# Patient Record
Sex: Male | Born: 1945
Health system: Southern US, Community
[De-identification: ages and names within clinical notes are randomized; demographics above are authoritative.]

## PROBLEM LIST (undated history)

## (undated) DIAGNOSIS — T7840XA Allergy, unspecified, initial encounter: Secondary | ICD-10-CM

## (undated) DIAGNOSIS — I350 Nonrheumatic aortic (valve) stenosis: Secondary | ICD-10-CM

## (undated) DIAGNOSIS — H409 Unspecified glaucoma: Secondary | ICD-10-CM

## (undated) DIAGNOSIS — G4733 Obstructive sleep apnea (adult) (pediatric): Secondary | ICD-10-CM

## (undated) DIAGNOSIS — Z86010 Personal history of colonic polyps: Secondary | ICD-10-CM

## (undated) DIAGNOSIS — R011 Cardiac murmur, unspecified: Secondary | ICD-10-CM

## (undated) DIAGNOSIS — E119 Type 2 diabetes mellitus without complications: Secondary | ICD-10-CM

## (undated) DIAGNOSIS — K259 Gastric ulcer, unspecified as acute or chronic, without hemorrhage or perforation: Secondary | ICD-10-CM

## (undated) DIAGNOSIS — K5730 Diverticulosis of large intestine without perforation or abscess without bleeding: Secondary | ICD-10-CM

## (undated) DIAGNOSIS — E538 Deficiency of other specified B group vitamins: Secondary | ICD-10-CM

## (undated) DIAGNOSIS — I10 Essential (primary) hypertension: Secondary | ICD-10-CM

## (undated) DIAGNOSIS — Z8739 Personal history of other diseases of the musculoskeletal system and connective tissue: Secondary | ICD-10-CM

## (undated) DIAGNOSIS — I219 Acute myocardial infarction, unspecified: Secondary | ICD-10-CM

## (undated) DIAGNOSIS — C439 Malignant melanoma of skin, unspecified: Secondary | ICD-10-CM

## (undated) DIAGNOSIS — D649 Anemia, unspecified: Secondary | ICD-10-CM

## (undated) DIAGNOSIS — G4730 Sleep apnea, unspecified: Secondary | ICD-10-CM

## (undated) DIAGNOSIS — F515 Nightmare disorder: Secondary | ICD-10-CM

## (undated) DIAGNOSIS — K219 Gastro-esophageal reflux disease without esophagitis: Secondary | ICD-10-CM

## (undated) DIAGNOSIS — H269 Unspecified cataract: Secondary | ICD-10-CM

## (undated) DIAGNOSIS — Z5189 Encounter for other specified aftercare: Secondary | ICD-10-CM

## (undated) DIAGNOSIS — M1990 Unspecified osteoarthritis, unspecified site: Secondary | ICD-10-CM

## (undated) DIAGNOSIS — K265 Chronic or unspecified duodenal ulcer with perforation: Secondary | ICD-10-CM

## (undated) DIAGNOSIS — R001 Bradycardia, unspecified: Secondary | ICD-10-CM

## (undated) DIAGNOSIS — Z8639 Personal history of other endocrine, nutritional and metabolic disease: Secondary | ICD-10-CM

## (undated) DIAGNOSIS — I2510 Atherosclerotic heart disease of native coronary artery without angina pectoris: Secondary | ICD-10-CM

## (undated) DIAGNOSIS — Z8719 Personal history of other diseases of the digestive system: Secondary | ICD-10-CM

## (undated) DIAGNOSIS — Z952 Presence of prosthetic heart valve: Secondary | ICD-10-CM

## (undated) DIAGNOSIS — E7800 Pure hypercholesterolemia, unspecified: Secondary | ICD-10-CM

## (undated) DIAGNOSIS — M726 Necrotizing fasciitis: Secondary | ICD-10-CM

## (undated) HISTORY — DX: Diverticulosis of large intestine without perforation or abscess without bleeding: K57.30

## (undated) HISTORY — DX: Chronic or unspecified duodenal ulcer with perforation: K26.5

## (undated) HISTORY — DX: Malignant melanoma of skin, unspecified: C43.9

## (undated) HISTORY — DX: Cardiac murmur, unspecified: R01.1

## (undated) HISTORY — DX: Nightmare disorder: F51.5

## (undated) HISTORY — DX: Obstructive sleep apnea (adult) (pediatric): G47.33

## (undated) HISTORY — PX: OTHER SURGICAL HISTORY: SHX169

## (undated) HISTORY — DX: Acute myocardial infarction, unspecified: I21.9

## (undated) HISTORY — PX: CATARACT EXTRACTION W/ INTRAOCULAR LENS IMPLANT: SHX1309

## (undated) HISTORY — DX: Encounter for other specified aftercare: Z51.89

## (undated) HISTORY — PX: COLONOSCOPY: SHX174

## (undated) HISTORY — DX: Pure hypercholesterolemia, unspecified: E78.00

## (undated) HISTORY — DX: Unspecified glaucoma: H40.9

## (undated) HISTORY — DX: Personal history of other diseases of the musculoskeletal system and connective tissue: Z87.39

## (undated) HISTORY — DX: Sleep apnea, unspecified: G47.30

## (undated) HISTORY — DX: Bradycardia, unspecified: R00.1

## (undated) HISTORY — DX: Atherosclerotic heart disease of native coronary artery without angina pectoris: I25.10

## (undated) HISTORY — DX: Essential (primary) hypertension: I10

## (undated) HISTORY — DX: Anemia, unspecified: D64.9

## (undated) HISTORY — DX: Nonrheumatic aortic (valve) stenosis: I35.0

## (undated) HISTORY — PX: POLYPECTOMY: SHX149

## (undated) HISTORY — DX: Unspecified cataract: H26.9

## (undated) HISTORY — DX: Type 2 diabetes mellitus without complications: E11.9

## (undated) HISTORY — DX: Deficiency of other specified B group vitamins: E53.8

## (undated) HISTORY — DX: Personal history of other endocrine, nutritional and metabolic disease: Z86.39

## (undated) HISTORY — DX: Gastric ulcer, unspecified as acute or chronic, without hemorrhage or perforation: K25.9

## (undated) HISTORY — PX: AORTIC VALVE REPLACEMENT: SHX41

## (undated) HISTORY — DX: Necrotizing fasciitis: M72.6

## (undated) HISTORY — DX: Personal history of other diseases of the digestive system: Z87.19

## (undated) HISTORY — PX: RETINAL LASER PROCEDURE: SHX2339

## (undated) HISTORY — DX: Presence of prosthetic heart valve: Z95.2

## (undated) HISTORY — DX: Allergy, unspecified, initial encounter: T78.40XA

## (undated) HISTORY — DX: Personal history of colonic polyps: Z86.010

---

## 1970-07-11 HISTORY — PX: HERNIA REPAIR: SHX51

## 1993-07-11 DIAGNOSIS — M726 Necrotizing fasciitis: Secondary | ICD-10-CM

## 1993-07-11 HISTORY — PX: SCROTAL SURGERY: SHX2387

## 1993-07-11 HISTORY — DX: Necrotizing fasciitis: M72.6

## 2001-09-12 ENCOUNTER — Ambulatory Visit (HOSPITAL_COMMUNITY): Admission: RE | Admit: 2001-09-12 | Discharge: 2001-09-12 | Payer: Self-pay | Admitting: Gastroenterology

## 2001-09-12 ENCOUNTER — Encounter: Payer: Self-pay | Admitting: Gastroenterology

## 2006-09-06 ENCOUNTER — Ambulatory Visit: Payer: Self-pay | Admitting: Vascular Surgery

## 2008-07-07 ENCOUNTER — Ambulatory Visit: Payer: Self-pay | Admitting: Gastroenterology

## 2008-07-07 ENCOUNTER — Inpatient Hospital Stay (HOSPITAL_COMMUNITY): Admission: EM | Admit: 2008-07-07 | Discharge: 2008-07-10 | Payer: Self-pay | Admitting: Emergency Medicine

## 2008-07-08 ENCOUNTER — Encounter: Payer: Self-pay | Admitting: Gastroenterology

## 2008-07-09 ENCOUNTER — Encounter: Payer: Self-pay | Admitting: Gastroenterology

## 2008-07-11 HISTORY — PX: CORONARY ARTERY BYPASS GRAFT: SHX141

## 2008-07-30 ENCOUNTER — Encounter: Payer: Self-pay | Admitting: Gastroenterology

## 2008-08-14 DIAGNOSIS — I10 Essential (primary) hypertension: Secondary | ICD-10-CM | POA: Insufficient documentation

## 2008-08-14 DIAGNOSIS — F411 Generalized anxiety disorder: Secondary | ICD-10-CM | POA: Insufficient documentation

## 2008-08-14 DIAGNOSIS — K5730 Diverticulosis of large intestine without perforation or abscess without bleeding: Secondary | ICD-10-CM | POA: Insufficient documentation

## 2008-08-14 DIAGNOSIS — K2990 Gastroduodenitis, unspecified, without bleeding: Secondary | ICD-10-CM

## 2008-08-14 DIAGNOSIS — K259 Gastric ulcer, unspecified as acute or chronic, without hemorrhage or perforation: Secondary | ICD-10-CM | POA: Insufficient documentation

## 2008-08-14 DIAGNOSIS — G4733 Obstructive sleep apnea (adult) (pediatric): Secondary | ICD-10-CM

## 2008-08-14 DIAGNOSIS — E1169 Type 2 diabetes mellitus with other specified complication: Secondary | ICD-10-CM

## 2008-08-14 DIAGNOSIS — H409 Unspecified glaucoma: Secondary | ICD-10-CM | POA: Insufficient documentation

## 2008-08-14 DIAGNOSIS — E785 Hyperlipidemia, unspecified: Secondary | ICD-10-CM | POA: Insufficient documentation

## 2008-08-14 DIAGNOSIS — J029 Acute pharyngitis, unspecified: Secondary | ICD-10-CM

## 2008-08-14 DIAGNOSIS — K2970 Gastritis, unspecified, without bleeding: Secondary | ICD-10-CM | POA: Insufficient documentation

## 2008-08-19 ENCOUNTER — Ambulatory Visit: Payer: Self-pay | Admitting: Gastroenterology

## 2008-08-19 DIAGNOSIS — Z953 Presence of xenogenic heart valve: Secondary | ICD-10-CM

## 2008-08-19 DIAGNOSIS — D509 Iron deficiency anemia, unspecified: Secondary | ICD-10-CM

## 2008-08-19 DIAGNOSIS — K922 Gastrointestinal hemorrhage, unspecified: Secondary | ICD-10-CM | POA: Insufficient documentation

## 2008-08-27 ENCOUNTER — Ambulatory Visit: Payer: Self-pay | Admitting: Gastroenterology

## 2008-09-10 ENCOUNTER — Encounter: Payer: Self-pay | Admitting: Gastroenterology

## 2008-09-10 ENCOUNTER — Ambulatory Visit: Payer: Self-pay | Admitting: Gastroenterology

## 2008-09-12 ENCOUNTER — Encounter: Payer: Self-pay | Admitting: Gastroenterology

## 2008-09-17 ENCOUNTER — Telehealth: Payer: Self-pay | Admitting: Gastroenterology

## 2008-09-30 ENCOUNTER — Inpatient Hospital Stay (HOSPITAL_BASED_OUTPATIENT_CLINIC_OR_DEPARTMENT_OTHER): Admission: RE | Admit: 2008-09-30 | Discharge: 2008-09-30 | Payer: Self-pay | Admitting: Cardiology

## 2008-10-07 ENCOUNTER — Encounter: Payer: Self-pay | Admitting: Gastroenterology

## 2008-10-07 ENCOUNTER — Telehealth (INDEPENDENT_AMBULATORY_CARE_PROVIDER_SITE_OTHER): Payer: Self-pay | Admitting: *Deleted

## 2008-10-09 ENCOUNTER — Ambulatory Visit: Payer: Self-pay | Admitting: Cardiothoracic Surgery

## 2008-10-16 ENCOUNTER — Ambulatory Visit (HOSPITAL_COMMUNITY): Admission: RE | Admit: 2008-10-16 | Discharge: 2008-10-16 | Payer: Self-pay | Admitting: Cardiothoracic Surgery

## 2008-10-16 ENCOUNTER — Ambulatory Visit: Payer: Self-pay | Admitting: Vascular Surgery

## 2008-10-16 ENCOUNTER — Encounter: Payer: Self-pay | Admitting: Cardiothoracic Surgery

## 2008-10-20 ENCOUNTER — Ambulatory Visit: Payer: Self-pay | Admitting: Cardiothoracic Surgery

## 2008-10-20 ENCOUNTER — Encounter: Payer: Self-pay | Admitting: Cardiothoracic Surgery

## 2008-10-20 ENCOUNTER — Inpatient Hospital Stay (HOSPITAL_COMMUNITY): Admission: RE | Admit: 2008-10-20 | Discharge: 2008-10-26 | Payer: Self-pay | Admitting: Cardiothoracic Surgery

## 2008-11-13 ENCOUNTER — Encounter: Admission: RE | Admit: 2008-11-13 | Discharge: 2008-11-13 | Payer: Self-pay | Admitting: Cardiothoracic Surgery

## 2008-11-13 ENCOUNTER — Ambulatory Visit: Payer: Self-pay | Admitting: Cardiothoracic Surgery

## 2009-11-19 ENCOUNTER — Encounter (INDEPENDENT_AMBULATORY_CARE_PROVIDER_SITE_OTHER): Payer: Self-pay | Admitting: *Deleted

## 2009-11-19 ENCOUNTER — Ambulatory Visit: Payer: Self-pay | Admitting: Gastroenterology

## 2009-11-19 DIAGNOSIS — K219 Gastro-esophageal reflux disease without esophagitis: Secondary | ICD-10-CM | POA: Insufficient documentation

## 2009-11-19 DIAGNOSIS — R195 Other fecal abnormalities: Secondary | ICD-10-CM

## 2009-11-19 DIAGNOSIS — R079 Chest pain, unspecified: Secondary | ICD-10-CM

## 2009-11-23 ENCOUNTER — Ambulatory Visit: Payer: Self-pay | Admitting: Gastroenterology

## 2009-11-23 DIAGNOSIS — Z860100 Personal history of colon polyps, unspecified: Secondary | ICD-10-CM

## 2009-11-23 HISTORY — DX: Personal history of colon polyps, unspecified: Z86.0100

## 2009-11-23 HISTORY — DX: Personal history of colonic polyps: Z86.010

## 2009-11-25 ENCOUNTER — Ambulatory Visit: Payer: Self-pay | Admitting: Gastroenterology

## 2009-11-25 ENCOUNTER — Telehealth: Payer: Self-pay | Admitting: Gastroenterology

## 2009-11-25 LAB — CONVERTED CEMR LAB
AST: 22 units/L (ref 0–37)
Alkaline Phosphatase: 37 units/L — ABNORMAL LOW (ref 39–117)
BUN: 17 mg/dL (ref 6–23)
Basophils Relative: 0.3 % (ref 0.0–3.0)
CO2: 30 meq/L (ref 19–32)
CRP, High Sensitivity: >80 — ABNORMAL HIGH (ref 0.00–5.00)
Calcium: 9.5 mg/dL (ref 8.4–10.5)
Chloride: 102 meq/L (ref 96–112)
Creatinine, Ser: 1.1 mg/dL (ref 0.4–1.5)
Eosinophils Absolute: 0.3 10*3/uL (ref 0.0–0.7)
Eosinophils Relative: 4.2 % (ref 0.0–5.0)
Folate: >20 ng/mL
GFR calc non Af Amer: 73.05 mL/min (ref >60)
INR: 1.1 — ABNORMAL HIGH (ref 0.8–1.0)
Lymphocytes Relative: 17.8 % (ref 12.0–46.0)
MCV: 80.4 fL (ref 78.0–100.0)
Monocytes Absolute: 1 10*3/uL (ref 0.1–1.0)
Platelets: 158 10*3/uL (ref 150.0–400.0)
Prothrombin Time: 11 s (ref 9.1–11.7)
RDW: 21.4 % — ABNORMAL HIGH (ref 11.5–14.6)
Saturation Ratios: 6 % — ABNORMAL LOW (ref 20.0–50.0)
TSH: 1.52 microintl units/mL (ref 0.35–5.50)
Transferrin: 261.3 mg/dL (ref 212.0–360.0)
WBC: 6.2 10*3/uL (ref 4.5–10.5)

## 2009-12-02 ENCOUNTER — Ambulatory Visit: Payer: Self-pay | Admitting: Gastroenterology

## 2009-12-02 DIAGNOSIS — E538 Deficiency of other specified B group vitamins: Secondary | ICD-10-CM | POA: Insufficient documentation

## 2009-12-09 ENCOUNTER — Ambulatory Visit: Payer: Self-pay | Admitting: Gastroenterology

## 2009-12-09 ENCOUNTER — Ambulatory Visit (HOSPITAL_COMMUNITY): Admission: RE | Admit: 2009-12-09 | Discharge: 2009-12-09 | Payer: Self-pay | Admitting: Gastroenterology

## 2010-02-01 ENCOUNTER — Telehealth: Payer: Self-pay | Admitting: Gastroenterology

## 2010-02-04 ENCOUNTER — Ambulatory Visit: Payer: Self-pay | Admitting: Gastroenterology

## 2010-03-10 ENCOUNTER — Ambulatory Visit: Payer: Self-pay | Admitting: Gastroenterology

## 2010-04-07 ENCOUNTER — Ambulatory Visit: Payer: Self-pay | Admitting: Gastroenterology

## 2010-04-07 ENCOUNTER — Ambulatory Visit: Payer: Self-pay | Admitting: Cardiology

## 2010-05-04 ENCOUNTER — Ambulatory Visit: Payer: Self-pay | Admitting: Gastroenterology

## 2010-06-02 ENCOUNTER — Ambulatory Visit: Payer: Self-pay | Admitting: Gastroenterology

## 2010-07-01 ENCOUNTER — Ambulatory Visit: Payer: Self-pay | Admitting: Gastroenterology

## 2010-08-04 ENCOUNTER — Ambulatory Visit
Admission: RE | Admit: 2010-08-04 | Discharge: 2010-08-04 | Payer: Self-pay | Source: Home / Self Care | Attending: Gastroenterology | Admitting: Gastroenterology

## 2010-08-12 NOTE — Assessment & Plan Note (Signed)
Summary: MONTHLY B12//266.2//SP  Nurse Visit   Allergies: 1)  ! * Shellfish  Medication Administration  Injection # 1:    Medication: Vit B12 1000 mcg    Diagnosis: B12 DEFICIENCY (ICD-266.2)    Route: IM    Site: R deltoid    Exp Date: 10/13    Lot #: [id number]    Mfr: American Regent    Patient tolerated injection without complications    Given by: Lamona Curl CMA (AAMA) (June 30, 2010 8:49 AM)  Orders Added: 1)  Vit B12 1000 mcg [J3420]

## 2010-08-12 NOTE — Assessment & Plan Note (Signed)
Summary: MONTHLY B12 SHOT...LSW.  Nurse Visit   Medication Administration  Injection # 1:    Medication: Vit B12 1000 mcg    Diagnosis: B12 DEFICIENCY (ICD-266.2)    Route: IM    Site: R deltoid    Exp Date: 01/2012    Lot #: [id number]    Mfr: American Regent    Comments: pt will return on 11/23 for next injection    Patient tolerated injection without complications    Given by: Francee Piccolo CMA Duncan Dull) (May 04, 2010 9:13 AM)  Orders Added: 1)  Vit B12 1000 mcg [J3420]

## 2010-08-12 NOTE — Assessment & Plan Note (Signed)
Summary: MONTHLY B12 SHOT/JMS  Nurse Visit   Allergies: 1)  ! * Shellfish  Medication Administration  Injection # 1:    Medication: Vit B12 1000 mcg    Diagnosis: B12 DEFICIENCY (ICD-266.2)    Route: IM    Site: L deltoid    Exp Date: 11/13    Lot #: [id number]    Mfr: American Regent    Comments: pt to schedule next monthly b12 at front desk    Patient tolerated injection without complications    Given by: Chales Abrahams CMA Duncan Dull) (August 04, 2010 9:57 AM)  Orders Added: 1)  Vit B12 1000 mcg [J3420]

## 2010-08-12 NOTE — Assessment & Plan Note (Signed)
Summary: IRON DEF ANEMIA/YF   History of Present Illness Visit Type: Follow-up Visit Primary GI MD: Sheryn Bison MD FACP FAGA Primary Provider: Jarome Matin, MD Requesting Provider: Jarome Matin, MD Chief Complaint: iron deficiency anemia History of Present Illness:   Complex 65 year old Caucasian male with multiple cardiovascular issues including within the last year triple vessel bypass surgery and aortic valve replacement. He is followed by Dr. Patty Sermons in cardiology and is on 325 mg of aspirin a day. Last year when he was admitted he was found to be markedly anemic and underwent endoscopy which showed a gastric ulcer which was followed to healing endoscopically. His last colonoscopy was 7-8 years ago. He has remained anemic despite iron therapy and daily omeprazole 20 mg twice a day.  Has occasional vague epigastric discomfort with some acid reflux symptoms. His oral iron therapy as recently been increased to twice a day. He has chronically dark stools but denies rectal bleeding or bowel irregularity. He denies use of alcohol, cigarettes, or other NSAIDs. He does cream her chronic fatigue but no current cardiovascular or pulmonary complaints.  He has chronic hypertension hyperlipidemia, adult onset diabetes, and chronic pain syndrome. He has had laser therapy for diabetic retinopathy, and also repair of inguinal hernias. Additional diagnoses include obstructive sleep apnea and glaucoma. Apparently his diabetes is doing fairly well on oral medications and he is followed by Dr. Jarome Matin. There is no history of known liver disease, cirrhosis, prior hepatitis or pancreatitis.   GI Review of Systems    Reports acid reflux, belching, and  chest pain.      Denies abdominal pain, bloating, dysphagia with liquids, dysphagia with solids, heartburn, loss of appetite, nausea, vomiting, vomiting blood, weight loss, and  weight gain.      Reports black tarry stools.     Denies anal  fissure, change in bowel habit, constipation, diarrhea, diverticulosis, fecal incontinence, heme positive stool, hemorrhoids, irritable bowel syndrome, jaundice, light color stool, liver problems, rectal bleeding, and  rectal pain.    Current Medications (verified): 1)  Aspirin 325 Mg Tabs (Aspirin) .Marland Kitchen.. 1 By Mouth Once Daily 2)  Omeprazole 20 Mg Cpdr (Omeprazole) .... Take One By Mouth Two Times A Day 3)  Crestor 5 Mg Tabs (Rosuvastatin Calcium) .... Take One By Mouth Once Daily 4)  Lisinopril 40 Mg Tabs (Lisinopril) .... Take One By Mouth Once Daily 5)  Avandamet 10-998 Mg Tabs (Rosiglitazone-Metformin) .... Take One By Mouth Two Times A Day 6)  Glipizide 5 Mg Tabs (Glipizide) .Marland Kitchen.. 1 By Mouth Once Daily 7)  Nuvigil 150 Mg Tabs (Armodafinil) .... Take One By Mouth Once Daily 8)  Metoprolol Tartrate 50 Mg Tabs (Metoprolol Tartrate) .Marland Kitchen.. 1 By Mouth Two Times A Day 9)  Multivitamins  Caps (Multiple Vitamin) .... Take One By Mouth Once Daily 10)  Niacin 500 Mg Tabs (Niacin) .... Take One By Mouth Once Daily 11)  Xanax 0.25 Mg Tabs (Alprazolam) .... Take One By Mouth Three Times A Day 12)  Ferrous Sulfate 325 (65 Fe) Mg Tabs (Ferrous Sulfate) .Marland Kitchen.. 1 By Mouth Two Times A Day 13)  Travatan 0.004 % Soln (Travoprost) .... One Drop in Each Eye Nightly 14)  Benicar 20 Mg Tabs (Olmesartan Medoxomil) .Marland Kitchen.. 1 By Mouth Once Daily 15)  Vicodin 5-500 Mg Tabs (Hydrocodone-Acetaminophen) .... Take As Needed Pain 16)  Alavert 10 Mg Tabs (Loratadine) .... Take As Needed 17)  Fluticasone Propionate 50 Mcg/act Susp (Fluticasone Propionate) .... Take As Needed 18)  Vitamin D 1000  Unit Tabs (Cholecalciferol) .Marland Kitchen.. 1 By Mouth Once Daily  Allergies (verified): 1)  ! * Shellfish  Past History:  Past medical, surgical, family and social histories (including risk factors) reviewed for relevance to current acute and chronic problems.  Past Medical History: Reviewed history from 08/14/2008 and no changes  required. Current Problems:  DIVERTICULOSIS, COLON (ICD-562.10) PHARYNGITIS (ICD-462) GLAUCOMA (ICD-365.9) ANXIETY (ICD-300.00) OBSTRUCTIVE SLEEP APNEA (ICD-327.23) DYSLIPIDEMIA (ICD-272.4) HYPERTENSION (ICD-401.9) GASTRITIS (ICD-535.50) GASTRIC ULCER (ICD-531.90) DIABETES MELLITUS, TYPE II, MILD (ICD-250.00)  Past Surgical History: Reviewed history from 08/14/2008 and no changes required. left inguinal hernia repair 1970 scrotum and perineum surgery 2001 and 2006 laser treatment both eyes   Family History: Reviewed history from 08/19/2008 and no changes required. Family History of Liver Disease/Cirrhosis:Father Family History of Diabetes: Mother, Sister Family History of rectal carcinoma: Mother No FH of Colon Cancer:  Social History: Reviewed history from 08/14/2008 and no changes required. 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  Alcohol Use - no Illicit Drug Use - no Daily Caffeine Use  Review of Systems General:  Complains of fatigue and weakness; denies fever, chills, sweats, anorexia, malaise, weight loss, and sleep disorder. Eyes:  Complains of blurring, vision loss, and eye pain; denies diplopia, irritation, discharge, scotoma, and photophobia. ENT:  Denies earache, ear discharge, tinnitus, decreased hearing, nasal congestion, loss of smell, nosebleeds, sore throat, hoarseness, and difficulty swallowing. CV:  Complains of chest pains and dyspnea on exertion; denies angina, palpitations, syncope, orthopnea, PND, peripheral edema, and claudication. Resp:  Complains of dyspnea with exercise and wheezing; denies dyspnea at rest, cough, sputum, coughing up blood, and pleurisy. GI:  Complains of abdominal pain and gas/bloating; denies difficulty swallowing, pain on swallowing, nausea, indigestion/heartburn, vomiting, vomiting blood, jaundice, diarrhea, constipation, change in bowel habits, bloody BM's, black BMs, and fecal incontinence. GU:  Denies urinary  burning, blood in urine, urinary frequency, urinary hesitancy, nocturnal urination, urinary incontinence, penile discharge, genital sores, decreased libido, and erectile dysfunction. MS:  Complains of joint stiffness, low back pain, and muscle cramps; denies joint pain / LOM, joint swelling, joint deformity, muscle weakness, muscle atrophy, leg pain at night, leg pain with exertion, and shoulder pain / LOM hand / wrist pain (CTS). Derm:  Denies rash, itching, dry skin, hives, moles, warts, and unhealing ulcers. Neuro:  Complains of abnormal sensation and radiculopathy other:; denies weakness, paralysis, seizures, syncope, tremors, vertigo, transient blindness, frequent falls, frequent headaches, difficulty walking, headache, sciatica, restless legs, memory loss, and confusion. Psych:  Complains of depression; denies anxiety, memory loss, suicidal ideation, hallucinations, paranoia, phobia, and confusion. Endo:  Denies cold intolerance, heat intolerance, polydipsia, polyphagia, polyuria, unusual weight change, and hirsutism. Heme:  Denies bruising, bleeding, enlarged lymph nodes, and pagophagia. Allergy:  Denies hives, rash, sneezing, hay fever, and recurrent infections.  Vital Signs:  Patient profile:   65 year old male Height:      67 inches Weight:      202 pounds BMI:     31.75 Pulse rate:   68 / minute Pulse rhythm:   regular BP sitting:   152 / 80  (left arm)  Vitals Entered By: Milford Cage NCMA (Nov 19, 2009 8:58 AM)  Physical Exam  General:  Well developed, well nourished, no acute distress.Pale appearing without stigmata of chronic liver disease. Head:  Normocephalic and atraumatic. Eyes:  PERRLA, no icterus.exam deferred to patient's ophthalmologist.   Neck:  Supple; no masses or thyromegaly. Lungs:  Clear throughout to auscultation. Heart:  Regular rate  and rhythm; no murmurs, rubs,  or bruits.1-2/6 systolic ejection murmur noted at cardiac base without S3 gallop. Abdomen:   obese.  Liver edge is palpable but nontender and nonnodular, no splenomegaly, other abdominal masses or tenderness noted. Rectal:  Normal exam.hemoccult positive.   Prostate:  .normal size prostate.   Msk:  Symmetrical with no gross deformities. Normal posture. Extremities:  No clubbing, cyanosis, edema or deformities noted.trace pedal edema.   Neurologic:  Alert and  oriented x4;  grossly normal neurologically. Skin:  Intact without significant lesions or rashes. Cervical Nodes:  No significant cervical adenopathy. Psych:  Alert and cooperative. Normal mood and affect.   Impression & Recommendations:  Problem # 1:  BLOOD IN STOOL, OCCULT (ICD-792.1) Assessment Unchanged Probable continued damage from salicylates despite omeprazole therapy. He continued to have some acid reflux symptoms. I've scheduled endoscopy, will continue twice a day omeprazole, and have added liquid Carafate after meals and at bedtime. Discontinue his iron therapy, he may need transfusion or intravenous iron. Again I reiterated not to use other anti-inflammatories. Orders: TLB-CBC Platelet - w/Differential (85025-CBCD) TLB-BMP (Basic Metabolic Panel-BMET) (80048-METABOL) TLB-Hepatic/Liver Function Pnl (80076-HEPATIC) TLB-TSH (Thyroid Stimulating Hormone) (84443-TSH) TLB-B12, Serum-Total ONLY ([address]-U04) TLB-Ferritin (82728-FER) TLB-Folic Acid (Folate) (82746-FOL) TLB-IBC Pnl (Iron/FE;Transferrin) (83550-IBC) TLB-CRP-High Sensitivity (C-Reactive Protein) (86140-FCRP) TLB-PT (Protime) (85610-PTP) Colon/Endo (Colon/Endo)  Problem # 2:  ESOPHAGEAL REFLUX (ICD-530.81) Assessment: Comment Only  Orders: TLB-CBC Platelet - w/Differential (85025-CBCD) TLB-BMP (Basic Metabolic Panel-BMET) (80048-METABOL) TLB-Hepatic/Liver Function Pnl (80076-HEPATIC) TLB-TSH (Thyroid Stimulating Hormone) (84443-TSH) TLB-B12, Serum-Total ONLY (54098-J19) TLB-Ferritin (82728-FER) TLB-Folic Acid (Folate) (82746-FOL) TLB-IBC Pnl  (Iron/FE;Transferrin) (83550-IBC) TLB-CRP-High Sensitivity (C-Reactive Protein) (86140-FCRP) TLB-PT (Protime) (85610-PTP) Colon/Endo (Colon/Endo)  Problem # 3:  ANEMIA-IRON DEFICIENCY (ICD-280.9) Assessment: Unchanged repeat labs ordered. I will also schedule followup colonoscopy which has not been done in 7-8 years. He will be prepped with a balanced electrolyte solution and closely monitored during his endoscopic exams. Previous endoscopy showed severe gastritis, biopsies did not show changes of portal gastropathy. 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 This is associated with his diabetes and obesity. Changes will be made in his diabetic medications for colonoscopy exam. We will continue the salicylate therapy throughout his workup. He appears to be well compensated at this time from a cardiovascular standpoint.  Problem # 4:  AORTIC STENOSIS (ICD-424.1) Assessment: Improved Records from recent hospitalization and cardiac surgery requested for review.  Problem # 5:  DIVERTICULOSIS, COLON (ICD-562.10) Assessment: Comment Only  Problem # 6:  OBSTRUCTIVE SLEEP APNEA (ICD-327.23) Assessment: Comment Only  Problem # 7:  DIABETES MELLITUS, TYPE II, MILD (ICD-250.00) Assessment: Improved Continue medications per primary care. Again, adjust those will be made for his colonoscopy preparation. Celiac antibodies also been checked for his diabetes, anemia, and osteoporosis.   Problem # 8:  ANXIETY (ICD-300.00) Assessment: Unchanged  Patient Instructions: 1)  Please go to the basement for lab work. 2)  Begin carafate liquid. 3)  You are scheduled for an endoscopy and colonoscopy. 4)  The medication list was reviewed and reconciled.  All changed / newly prescribed medications were explained.  A complete medication list was provided to the patient / caregiver. 5)  Copy sent to : Dr. Jarome Matin and Dr. Cassell Clement in  cardiology 6)  Please continue current medications.  7)  Colonoscopy and Flexible Sigmoidoscopy brochure given.  8)  Conscious Sedation brochure given.  9)  Upper Endoscopy brochure given.  Prescriptions: CARAFATE 1 GM/10ML  SUSP (SUCRALFATE) 1  gm after meals and at bedtime  #14 oz x 2   Entered by:   Ashok Cordia RN   Authorized by:   Mardella Layman MD [facility]   Signed by:   Ashok Cordia RN on 11/19/2009   Method used:   Electronically to        [facility] (retail)       [address]       [place], [place]  [address]       Ph: [phone]       Fax: [phone]   RxID:   [id number] MOVIPREP 100 GM  SOLR (PEG-KCL-NACL-NASULF-NA ASC-C) As per prep instructions.  #1 x 0   Entered by:   Ashok Cordia RN   Authorized by:   Mardella Layman MD [facility]   Signed by:   Ashok Cordia RN on 11/19/2009   Method used:   Electronically to        [facility] ([facility])       [address]       [place], [place]  [address]       Ph: [phone]       Fax: [phone]   RxID:   [id number]

## 2010-08-12 NOTE — Procedures (Signed)
Summary: Colonoscopy  Patient: Elba Dendinger Note: All result statuses are Final unless otherwise noted.  Tests: (1) Colonoscopy (COL)   COL Colonoscopy           DONE     [facility]     [address]     [place], [place]  [address]           COLONOSCOPY PROCEDURE REPORT           PATIENT:  Max, Nuno  MR#:  [id number]     BIRTHDATE:  1946/02/17, 63 yrs. old  GENDER:  male     ENDOSCOPIST:  Vania Rea. Jarold Motto, MD, [facility]     REF. BY:     PROCEDURE DATE:  11/23/2009     PROCEDURE:  Colonoscopy with snare polypectomy     ASA CLASS:  Class III     INDICATIONS:  colorectal cancer screening, average risk     MEDICATIONS:   Fentanyl 50 mcg IV, Versed 5 mg IV           DESCRIPTION OF PROCEDURE:   After the risks benefits and     alternatives of the procedure were thoroughly explained, informed     consent was obtained.  Digital rectal exam was performed and     revealed no abnormalities.   The LB CF-[id number] endoscope     was introduced through the anus and advanced to the cecum, which     was identified by both the appendix and ileocecal valve, without     limitations.  The quality of the prep was good, using MoviPrep.     The instrument was then slowly withdrawn as the colon was fully     examined.     <<PROCEDUREIMAGES>>           FINDINGS:  Moderate diverticulosis was found in the sigmoid to     descending colon segments.  A sessile polyp was found. 7mm flat     cecal polyp cold snare excised and sent to path.   Retroflexed     views in the rectum revealed no abnormalities.    The scope was     then withdrawn from the patient and the procedure completed.           COMPLICATIONS:  None     ENDOSCOPIC IMPRESSION:     1) Moderate diverticulosis in the sigmoid to descending colon     segments     2) Sessile polyp     RECOMMENDATIONS:     1) Follow up colonoscopy in 5 years     2) High fiber diet.     3) Upper endoscopy will be scheduled     REPEAT EXAM:   No           ______________________________     Vania Rea. Jarold Motto, MD, [facility]           CC:  Jarome Matin, MDThomas Patty Sermons, MD           n.     Rosalie Doctor:   Vania Rea. Patterson at 11/23/2009 08:55 AM           Manley Mason, [id number]  Note: An exclamation mark (!) indicates a result that was not dispersed into the flowsheet. Document Creation Date: 11/23/2009 8:55 AM _______________________________________________________________________  (1) Order result status: Final Collection or observation date-time: 11/23/2009 08:48 Requested date-time:  Receipt date-time:  Reported date-time:  Referring Physician:   Ordering Physician: Sheryn Bison [phone]) Specimen Source:  Source: Launa Grill Order Number: [id number] Lab site:   Appended Document: Colonoscopy     Procedures Next Due Date:    Colonoscopy: 11/2014

## 2010-08-12 NOTE — Procedures (Signed)
Summary: Upper Endoscopy  Patient: [name] Note: All result statuses are Final unless otherwise noted.  Tests: (1) Upper Endoscopy (EGD)   EGD Upper Endoscopy       DONE     [facility]     [address]     [place], [place]  [address]           ENDOSCOPY PROCEDURE REPORT           PATIENT:  Nathan [name], Nathan [name]  MR#:  [id number]     BIRTHDATE:  [birth year], 63 yrs. old  GENDER:  male           ENDOSCOPIST:  Vania Rea. Jarold Motto, MD, [facility]     Referred by:           PROCEDURE DATE:  11/23/2009     PROCEDURE:  EGD with biopsy     ASA CLASS:  Class III     INDICATIONS:  iron deficiency anemia, hemeoccult positive stool     HX. OF GASTRIC ULCER.ON ASA AND OMEPRAZOLE DAILY.           MEDICATIONS:   There was residual sedation effect present from     prior procedure., Fentanyl 25 mcg IV, Versed 4 mg IV     TOPICAL ANESTHETIC:  Exactacain Spray           DESCRIPTION OF PROCEDURE:   After the risks benefits and     alternatives of the procedure were thoroughly explained, informed     consent was obtained.  The LB GIF-H180 [id number] endoscope was     introduced through the mouth and advanced to the second portion of     the duodenum, limited by retching and gagging.   The instrument     was slowly withdrawn as the mucosa was fully examined.     <<PROCEDUREIMAGES>>           Gastropathy was found. ENTIRE STOMACH WITH CHRONIC GASTRITIS.NO     ULCERS NOTED.MULTIPLE BIOPSIES OF ENTIRE STOMACH DONE FOR H.PYLORI     EXAM.  Normal duodenal folds were noted.  The esophagus and     gastroesophageal junction were completely normal in appearance.     Retroflexed views revealed no masses.    The scope was then withdrawn     from the patient and the procedure completed.           COMPLICATIONS:  None           ENDOSCOPIC IMPRESSION:     1) Gastropathy     2) Normal duodenal folds     3) Normal esophagus     4) No masses     1.PROBABLE ASA/NSAID DAMAGE DESPITE PRILOSEC RX.EMAX  ORDERED FOR     H.PYLORI.     RECOMMENDATIONS:     1. CHANGE TO BID OMPERAZOLE 40MG .#120,REFILL X 11.     2.AWAIT BIOPSY RESULTS           REPEAT EXAM:  No           ______________________________     Vania Rea. Jarold Motto, MD, [facility]           CC:  [place]arome Matin, MD, Cassell Clement, MD           n.     Rosalie Doctor:   Vania Rea.  at 11/23/2009 09:10 AM           Manley Mason, [phone]  Note: An exclamation mark (!) indicates a result that was not  dispersed into the flowsheet. Document Creation Date: 11/23/2009 9:11 AM _______________________________________________________________________  (1) Order result status: Final Collection or observation date-time: 11/23/2009 09:01 Requested date-time:  Receipt date-time:  Reported date-time:  Referring Physician:   Ordering Physician: Sheryn Bison [phone]) Specimen Source:  Source: Launa Grill Order Number: [id number] Lab site:

## 2010-08-12 NOTE — Assessment & Plan Note (Signed)
Summary: B12 Inj, # 3 of 3 weekly, 266.2  Nurse Visit   Allergies: 1)  ! * Shellfish  Medication Administration  Injection # 1:    Medication: Vit B12 1000 mcg    Diagnosis: B12 DEFICIENCY (ICD-266.2)    Route: IM    Site: R deltoid    Exp Date: 10/2011    Lot #: [id number]    Mfr: American Regent    Patient tolerated injection without complications    Given by: Christie Nottingham CMA (AAMA) (December 09, 2009 10:16 AM)  Orders Added: 1)  Vit B12 1000 mcg [J3420] Pt was given samples of Nascobal to try first then pt will call back to let us know if he wants to continue the monthly injections or stay with the Nascobal. Pt verbalized understanding.

## 2010-08-12 NOTE — Assessment & Plan Note (Signed)
Summary: MONTHLY B12 SHOT...LSW.  Nurse Visit   Medication Administration  Injection # 1:    Medication: Vit B12 1000 mcg    Diagnosis: B12 DEFICIENCY (ICD-266.2)    Route: IM    Site: R deltoid    Exp Date: 02/2012    Lot #: [id number]    Mfr: [employer]    Comments: NEXT INJECTION ON 06/30/10    Patient tolerated injection without complications    Given by: Francee Piccolo CMA Duncan Dull) (June 02, 2010 8:49 AM)  Orders Added: 1)  Vit B12 1000 mcg [J3420]

## 2010-08-12 NOTE — Assessment & Plan Note (Signed)
Summary: #1 of 3 weekly B12, 266.2/dfs  Nurse Visit   Allergies: 1)  ! * Shellfish  Medication Administration  Injection # 1:    Medication: Vit B12 1000 mcg    Diagnosis: ANEMIA-IRON DEFICIENCY (ICD-280.9)    Route: IM    Site: L deltoid    Exp Date: 08/12/2011    Lot #: [id number]    Mfr: [employer] Regent    Comments: 266.2 Vit B12 Deficiency    Patient tolerated injection without complications    Given by: Lowry Ram NCMA (Nov 25, 2009 8:35 AM)  Orders Added: 1)  Vit B12 1000 mcg [J3420]  Appended Document: #1 of 3 weekly B12, 266.2/dfs Next appt made for B12 Inj #2 of 3, date of next injection 12-02-09 at 9AM.

## 2010-08-12 NOTE — Assessment & Plan Note (Signed)
Summary: B12 Inj, #2 of 3 weekly  Nurse Visit   Allergies: 1)  ! * Shellfish  Medication Administration  Injection # 1:    Medication: Vit B12 1000 mcg    Diagnosis: B12 DEFICIENCY (ICD-266.2)    Route: IM    Site: L deltoid    Exp Date: 08/12/2011    Lot #: [id number]    Mfr: [employer]ent    Comments: #2 of 3 weekly injections.  Scheduled #3 of 3 weekly for 12-09-09 at 9AM.     Patient tolerated injection without complications    Given by: Lowry Ram NCMA (Dec 02, 2009 8:52 AM)

## 2010-08-12 NOTE — Progress Notes (Signed)
Summary: Triage  Phone Note Call from Patient Call back at Work Phone [phone]   Caller: Patient Call For: Dr. Jarold Motto Reason for Call: Talk to Nurse Summary of Call: The b-12 spray is not working...would like to discuss Initial call taken by: Karna Christmas,  February 01, 2010 11:07 AM  Follow-up for Phone Call        Pt states he is having problems with stuffy nose and does not feel that the nasal B12 is working.  Pt wants start back on the injections.  Will come in this week for injection. Follow-up by: Ashok Cordia RN,  February 01, 2010 12:07 PM    New/Updated Medications: CYANOCOBALAMIN 1000 MCG/ML INJ SOLN (CYANOCOBALAMIN) 1 cc Im weekly X 3 then monthly

## 2010-08-12 NOTE — Miscellaneous (Signed)
Summary: rx omperazole  Clinical Lists Changes  Medications: Added new medication of OMEPRAZOLE 40 MG  CPDR (OMEPRAZOLE) 1 twice a day 30 minutes before meals - Signed Rx of OMEPRAZOLE 40 MG  CPDR (OMEPRAZOLE) 1 twice a day 30 minutes before meals;  #120 x 11;  Signed;  Entered by: Sherren Kerns RN;  Authorized by: Mardella Layman MD [facility];  Method used: Electronically to [facility] [address], [place], [place], [place]  [address], Ph: [phone], Fax: [phone] Observations: Added new observation of ALLERGY REV: Done (11/23/2009 9:26)    Prescriptions: OMEPRAZOLE 40 MG  CPDR (OMEPRAZOLE) 1 twice a day 30 minutes before meals  #120 x 11   Entered by:   Sherren Kerns RN   Authorized by:   Mardella Layman MD [facility]   Signed by:   Sherren Kerns RN on 11/23/2009   Method used:   Electronically to        [facility]       [address]       [place], [place]  [address]       Ph: [phone]       Fax: [phone]   RxID:   [id number]

## 2010-08-12 NOTE — Assessment & Plan Note (Signed)
Summary: MONTHLY B12 SHOT...LSW.  Nurse Visit   Medication Administration  Injection # 1:    Medication: Vit B12 1000 mcg    Diagnosis: B12 DEFICIENCY (ICD-266.2)    Route: IM    Site: R deltoid    Exp Date: 12/2011    Lot #: [id number]    Mfr: [employer] Regent    Comments: pt will return in one month for next injection    Patient tolerated injection without complications    Given by: Francee Piccolo CMA Duncan Dull) (March 10, 2010 8:28 AM)  Orders Added: 1)  Vit B12 1000 mcg [J3420]

## 2010-08-12 NOTE — Letter (Signed)
Summary: Patient Notice- Polyp Results  [facility]  [address] [place], [place] [address]   Phone: [phone]  Fax: [phone]        Nov 25, 2009 MRN: [id number]    Nathan [name] [address] [place], [place]  [address]    Dear Mr. Mcniel,  I am pleased to inform you that the colon polyp(s) removed during your recent colonoscopy was (were) found to be benign (no cancer detected) upon pathologic examination.  I recommend you have a repeat colonoscopy examination in 5_ years to look for recurrent polyps, as having colon polyps increases your risk for having recurrent polyps or even colon cancer in the future.  Should you develop new or worsening symptoms of abdominal pain, bowel habit changes or bleeding from the rectum or bowels, please schedule an evaluation with either your primary care physician or with me.  Additional information/recommendations:  __ No further action with gastroenterology is needed at this time. Please      follow-up with your primary care physician for your other healthcare      needs.  __ Please call [phone] to schedule a return visit to review your      situation.  __ Please keep your follow-up visit as already scheduled.  _X_ Continue treatment plan as outlined the day of your exam.  Please call us if you are having persistent problems or have questions about your condition that have not been fully answered at this time.  Sincerely,  Mardella Layman MD [facility]  This letter has been electronically signed by your physician.  Appended Document: Patient Notice- Polyp Results letter mailed

## 2010-08-12 NOTE — Letter (Signed)
Summary: [facility] Instructions  [facility]  [address] [place], [place] [address]   Phone: [phone]  Fax: [phone]       Nathan [name]    Jul 03, 1946    MRN: [id number]        Procedure Day /Date: Monday, 11/23/09     Arrival Time:  7:30      Procedure Time: 8:30     Location of Procedure:                    Nathan [name]  [facility] (4th Floor)                        PREPARATION FOR COLONOSCOPY WITH MOVIPREP   Starting 4 days prior to your procedure 11/19/09 do not eat nuts, seeds, popcorn, corn, beans, peas,  salads, or any raw vegetables.  Do not take any fiber supplements (e.g. Metamucil, Citrucel, and Benefiber).  THE DAY BEFORE YOUR PROCEDURE         DATE: 11/22/09   DAY: Sunday  1.  Drink clear liquids the entire day-NO SOLID FOOD  2.  Do not drink anything colored red or purple.  Avoid juices with pulp.  No orange juice.  3.  Drink at least 64 oz. (8 glasses) of fluid/clear liquids during the day to prevent dehydration and help the prep work efficiently.  CLEAR LIQUIDS INCLUDE: Water Jello Ice Popsicles Tea (sugar ok, no milk/cream) Powdered fruit flavored drinks Coffee (sugar ok, no milk/cream) Gatorade Juice: apple, white grape, white cranberry  Lemonade Clear bullion, consomm, broth Carbonated beverages (any kind) Strained chicken noodle soup Hard Candy                             4.  In the morning, mix first dose of MoviPrep solution:    Empty 1 Pouch A and 1 Pouch B into the disposable container    Add lukewarm drinking water to the top line of the container. Mix to dissolve    Refrigerate (mixed solution should be used within 24 hrs)  5.  Begin drinking the prep at 5:00 p.m. The MoviPrep container is divided by 4 marks.   Every 15 minutes drink the solution down to the next mark (approximately 8 oz) until the full liter is complete.   6.  Follow completed prep with 16 oz of clear liquid of your choice (Nothing red  or purple).  Continue to drink clear liquids until bedtime.  7.  Before going to bed, mix second dose of MoviPrep solution:    Empty 1 Pouch A and 1 Pouch B into the disposable container    Add lukewarm drinking water to the top line of the container. Mix to dissolve    Refrigerate  THE DAY OF YOUR PROCEDURE      DATE: 11/23/09   DAY: Sunday  Beginning at  3:30 a.m. (5 hours before procedure):         1. Every 15 minutes, drink the solution down to the next mark (approx 8 oz) until the full liter is complete.  2. Follow completed prep with 16 oz. of clear liquid of your choice.    3. You may drink clear liquids until 6:30  (2 HOURS BEFORE PROCEDURE).   MEDICATION INSTRUCTIONS  Unless otherwise instructed, you should take regular prescription medications with a small sip of water   as early as  possible the morning of your procedure.  Diabetic patients - see separate instructions.                OTHER INSTRUCTIONS  You will need a responsible adult at least 66 years of age to accompany you and drive you home.   This person must remain in the waiting room during your procedure.  Wear loose fitting clothing that is easily removed.  Leave jewelry and other valuables at home.  However, you may wish to bring a book to read or  an iPod/MP3 player to listen to music as you wait for your procedure to start.  Remove all body piercing jewelry and leave at home.  Total time from sign-in until discharge is approximately 2-3 hours.  You should go home directly after your procedure and rest.  You can resume normal activities the  day after your procedure.  The day of your procedure you should not:   Drive   Make legal decisions   Operate machinery   Drink alcohol   Return to work  You will receive specific instructions about eating, activities and medications before you leave.    The above instructions have been reviewed and explained to me by    _______________________    I fully understand and can verbalize these instructions _____________________________ Date _________

## 2010-08-12 NOTE — Progress Notes (Signed)
Summary: Molli Knock  Phone Note Call from Patient   Caller: Patient Summary of Call: Pt asking why [place] in needed.  Would like to know what doctor is looking for.  Pt states he is paranoid when it comes to his health because of some problems he had in the past.   Initial call taken by: Ashok Cordia RN,  Nov 25, 2009 8:45 AM  Follow-up for Phone Call        suspected fatty liver...r/o gallstones... Follow-up by: Mardella Layman MD FACG,  Nov 25, 2009 9:17 AM  Additional Follow-up for Phone Call Additional follow up Details #1::        LM for pt to call.   Lupita Leash Surface RN  Nov 25, 2009 9:43 AM  Pt notified.   Additional Follow-up by: Ashok Cordia RN,  Nov 25, 2009 10:10 AM

## 2010-08-12 NOTE — Letter (Signed)
Summary: Patient [facility] Biopsy Results  [facility]  [address] [place], [place] [address]   Phone: [phone]  Fax: [phone]        Nov 25, 2009 MRN: [id number]    Nathan [name] [address] [place], [place]  [address]    Dear Mr. Asher,  I am pleased to inform you that the biopsies taken during your recent endoscopic examination did not show any evidence of cancer upon pathologic examination.  Additional information/recommendations:  __No further action is needed at this time.  Please follow-up with      your primary care physician for your other healthcare needs.  __ Please call [phone] to schedule a return visit to review      your condition.  X__ Continue with the treatment plan as outlined on the day of your      exam.NO HELICOBACTER DETECTED ON BIOPSIES.  __ You should have a repeat endoscopic examination for this problem              in _ months/years.   Please call us if you are having persistent problems or have questions about your condition that have not been fully answered at this time.  Sincerely,  Mardella Layman MD [facility]  This letter has been electronically signed by your physician.  Appended Document: Patient Notice-Endo Biopsy Results letter mailed

## 2010-08-12 NOTE — Assessment & Plan Note (Signed)
Summary: MONTHLY B12 SHOT...LSW.  Nurse Visit   Allergies: 1)  ! * Shellfish  Medication Administration  Injection # 1:    Medication: Vit B12 1000 mcg    Diagnosis: B12 DEFICIENCY (ICD-266.2)    Route: IM    Site: R deltoid    Exp Date: 01/2012    Lot #: [id number]    Mfr: [employer] Regent    Comments: pt to schedule next monthly b12 at front desk.  pt request flu shot.    Patient tolerated injection without complications    Given by: Chales Abrahams CMA Duncan Dull) (April 07, 2010 1:50 PM)  Orders Added: 1)  Vit B12 1000 mcg [J3420] 2)  Flu Vaccine 15yrs + MEDICARE PATIENTS [Q2039] 3)  Administration Flu vaccine - MCR [G0008] Flu Vaccine Consent Questions     Do you have a history of severe allergic reactions to this vaccine? no    Any prior history of allergic reactions to egg and/or gelatin? no    Do you have a sensitivity to the preservative Thimersol? no    Do you have a past history of Guillan-Barre Syndrome? no    Do you currently have an acute febrile illness? no    Have you ever had a severe reaction to latex? no    Vaccine information given and explained to patient? yes    Are you currently pregnant? no    Lot Number:[id number]   Exp Date:12/29/10   Manufacturer: [employer]    Site Given  Left Deltoid IM VIS 42 usc [id number]  02/02/10 given to pt    Above under Orders added # 2 and # 3 was added in error.  Correct info was entered

## 2010-08-12 NOTE — Assessment & Plan Note (Signed)
Summary: Monthly B12/dfs  Nurse Visit   Allergies: 1)  ! * Shellfish  Medication Administration  Injection # 1:    Medication: Vit B12 1000 mcg    Diagnosis: B12 DEFICIENCY (ICD-266.2)    Route: IM    Site: R deltoid    Exp Date: 11/09/2011    Lot #: [id number]    Mfr: [employer]    Patient tolerated injection without complications    Given by: Harlow Mares CMA (AAMA) (February 04, 2010 9:48 AM)  Orders Added: 1)  Vit B12 1000 mcg [J3420]

## 2010-08-12 NOTE — Letter (Signed)
Summary: Diabetic Instructions   [facility]  [address] [place], [place] [address]   Phone: [phone]  Fax: [phone]    Nathan [name] 1945/12/04 MRN: [id number]   _X  _   ORAL DIABETIC MEDICATION INSTRUCTIONS  The day before your procedure:   Take your diabetic pill as you do normally  The day of your procedure:   Do not take your diabetic pill    We will check your blood sugar levels during the admission process and again in Recovery before discharging you home  ________________________________________________________________________  _  _   INSULIN (LONG ACTING) MEDICATION INSTRUCTIONS (Lantus, NPH, 70/30, Humulin, Novolin-N)   The day before your procedure:   Take  your regular evening dose    The day of your procedure:   Do not take your morning dose    _  _   INSULIN (SHORT ACTING) MEDICATION INSTRUCTIONS (Regular, Humulog, Novolog)   The day before your procedure:   Do not take your evening dose   The day of your procedure:   Do not take your morning dose   _  _   INSULIN PUMP MEDICATION INSTRUCTIONS  We will contact the physician managing your diabetic care for written dosage instructions for the day before your procedure and the day of your procedure.  Once we have received the instructions, we will contact you.

## 2010-08-30 ENCOUNTER — Encounter: Payer: Self-pay | Admitting: Cardiology

## 2010-08-30 DIAGNOSIS — Z952 Presence of prosthetic heart valve: Secondary | ICD-10-CM | POA: Insufficient documentation

## 2010-08-30 DIAGNOSIS — Z8639 Personal history of other endocrine, nutritional and metabolic disease: Secondary | ICD-10-CM | POA: Insufficient documentation

## 2010-08-30 DIAGNOSIS — E119 Type 2 diabetes mellitus without complications: Secondary | ICD-10-CM | POA: Insufficient documentation

## 2010-08-30 DIAGNOSIS — Z8739 Personal history of other diseases of the musculoskeletal system and connective tissue: Secondary | ICD-10-CM | POA: Insufficient documentation

## 2010-08-30 DIAGNOSIS — E7800 Pure hypercholesterolemia, unspecified: Secondary | ICD-10-CM | POA: Insufficient documentation

## 2010-08-30 DIAGNOSIS — N189 Chronic kidney disease, unspecified: Secondary | ICD-10-CM | POA: Insufficient documentation

## 2010-08-30 DIAGNOSIS — I10 Essential (primary) hypertension: Secondary | ICD-10-CM | POA: Insufficient documentation

## 2010-08-30 DIAGNOSIS — R001 Bradycardia, unspecified: Secondary | ICD-10-CM | POA: Insufficient documentation

## 2010-08-30 DIAGNOSIS — D649 Anemia, unspecified: Secondary | ICD-10-CM | POA: Insufficient documentation

## 2010-08-30 DIAGNOSIS — F515 Nightmare disorder: Secondary | ICD-10-CM | POA: Insufficient documentation

## 2010-08-30 DIAGNOSIS — Z8719 Personal history of other diseases of the digestive system: Secondary | ICD-10-CM | POA: Insufficient documentation

## 2010-08-30 DIAGNOSIS — I2510 Atherosclerotic heart disease of native coronary artery without angina pectoris: Secondary | ICD-10-CM | POA: Insufficient documentation

## 2010-08-30 DIAGNOSIS — I350 Nonrheumatic aortic (valve) stenosis: Secondary | ICD-10-CM | POA: Insufficient documentation

## 2010-09-08 ENCOUNTER — Encounter (INDEPENDENT_AMBULATORY_CARE_PROVIDER_SITE_OTHER): Payer: Medicare Other

## 2010-09-08 ENCOUNTER — Encounter: Payer: Self-pay | Admitting: Gastroenterology

## 2010-09-08 DIAGNOSIS — E538 Deficiency of other specified B group vitamins: Secondary | ICD-10-CM

## 2010-09-16 NOTE — Assessment & Plan Note (Signed)
Summary: MONTHLY B12 SHOT  Nurse Visit   Allergies: 1)  ! * Shellfish  Medication Administration  Injection # 1:    Medication: Vit B12 1000 mcg    Diagnosis: B12 DEFICIENCY (ICD-266.2)    Route: IM    Site: L deltoid    Exp Date: 05/2012    Lot #: [id number]    Mfr: American Regent    Comments: pt to schedule next monthly b12 at front desk    Patient tolerated injection without complications    Given by: Chales Abrahams CMA (AAMA) (September 08, 2010 9:02 AM)  Orders Added: 1)  Vit B12 1000 mcg [J3420]

## 2010-09-27 LAB — GLUCOSE, CAPILLARY: Glucose-Capillary: 91 mg/dL (ref 70–99)

## 2010-10-06 ENCOUNTER — Ambulatory Visit (INDEPENDENT_AMBULATORY_CARE_PROVIDER_SITE_OTHER): Payer: Medicare Other | Admitting: Gastroenterology

## 2010-10-06 DIAGNOSIS — E538 Deficiency of other specified B group vitamins: Secondary | ICD-10-CM

## 2010-10-06 MED ORDER — CYANOCOBALAMIN 1000 MCG/ML IJ SOLN
1000.0000 ug | INTRAMUSCULAR | Status: AC
  Administered 2010-10-06 – 2011-03-23 (×6): 1000 ug via INTRAMUSCULAR

## 2010-10-08 ENCOUNTER — Ambulatory Visit: Payer: Self-pay | Admitting: Cardiology

## 2010-10-13 ENCOUNTER — Encounter: Payer: Self-pay | Admitting: Cardiology

## 2010-10-13 ENCOUNTER — Ambulatory Visit (INDEPENDENT_AMBULATORY_CARE_PROVIDER_SITE_OTHER): Payer: Medicare Other | Admitting: Cardiology

## 2010-10-13 VITALS — BP 150/80 | HR 72 | Wt 204.0 lb

## 2010-10-13 DIAGNOSIS — I119 Hypertensive heart disease without heart failure: Secondary | ICD-10-CM

## 2010-10-13 DIAGNOSIS — Z9889 Other specified postprocedural states: Secondary | ICD-10-CM

## 2010-10-13 DIAGNOSIS — Z951 Presence of aortocoronary bypass graft: Secondary | ICD-10-CM

## 2010-10-13 DIAGNOSIS — E785 Hyperlipidemia, unspecified: Secondary | ICD-10-CM

## 2010-10-13 MED ORDER — HYDROCHLOROTHIAZIDE 12.5 MG PO CAPS: 12.5000 mg | ORAL_CAPSULE | Freq: Every day | ORAL | Status: DC

## 2010-10-13 NOTE — Progress Notes (Signed)
History of Present Illness: This pleasant 65 year old gentleman is seen for a scheduled six-month followup office visit he has a prior history of severe aortic stenosis as well as coronary disease.  In April 2010 he underwent coronary artery bypass graft surgery x3 and aortic valve replacement with a tissue valve.  His last stress test was 10/08/09 and showed normal ejection fraction of 56% and no ischemia his last echocardiogram on 12/02/09 showed normally functioning prosthetic aortic valve and normal pulmonary artery pressure and normal left ventricular systolic function with grade 1 diastolic dysfunction.  Current Outpatient Prescriptions  Medication Sig Dispense Refill  . ALPRAZolam (XANAX) 0.25 MG tablet Take 0.25 mg by mouth as needed.        Marland Kitchen amLODipine (NORVASC) 5 MG tablet Take 5 mg by mouth daily.        . Armodafinil (NUVIGIL) 150 MG tablet Take 150 mg by mouth as directed. OCCIASIONALLY       . aspirin 325 MG tablet Take 325 mg by mouth daily.        . Cholecalciferol (VITAMIN D3) 1000 UNITS capsule Take 1,000 Units by mouth daily.        . Cyanocobalamin (VITAMIN B-12 IJ) Inject as directed every 30 (thirty) days.        Marland Kitchen glipiZIDE (GLUCOTROL) 5 MG tablet Take 5 mg by mouth daily.        . hydrocodone-acetaminophen (LORCET-HD) 5-500 MG per capsule Take 1 capsule by mouth as needed.        . metoprolol (LOPRESSOR) 50 MG tablet Take by mouth 2 (two) times daily.        . minoxidil (LONITEN) 2.5 MG tablet Take 2.5 mg by mouth daily.        . Multiple Vitamin (MULTIVITAMIN) tablet Take 1 tablet by mouth daily.        Marland Kitchen olmesartan (BENICAR) 40 MG tablet Take 40 mg by mouth daily.        Marland Kitchen omeprazole (PRILOSEC) 40 MG capsule Take 40 mg by mouth 2 (two) times daily.        . Pioglitazone HCl (ACTOS PO) Take 15 mg by mouth. Patient taking actosplus met XR 15/1000mg  daily       . POLYSACCHARIDE IRON (IRON POLYSACCHARIDES) 150 MG CAPS Take by mouth 2 (two) times daily.        . rosuvastatin  (CRESTOR) 5 MG tablet Take by mouth daily.        . hydrochlorothiazide (,MICROZIDE/HYDRODIURIL,) 12.5 MG capsule Take 1 capsule (12.5 mg total) by mouth daily.  30 capsule  11  . loratadine-pseudoephedrine (CLARITIN-D 12-HOUR) 5-120 MG per tablet Take 1 tablet by mouth as needed.        . rosiglitazone-metformin (AVANDAMET) 10-998 MG per tablet Take 1 tablet by mouth 2 (two) times daily.        . travoprost, benzalkonium, (TRAVATAN) 0.004 % ophthalmic solution 1 drop as directed.         Current Facility-Administered Medications  Medication Dose Route Frequency Provider Last Rate Last Dose  . cyanocobalamin ((VITAMIN B-12)) injection 1,000 mcg  1,000 mcg Intramuscular Q30 days Sheryn Bison, MD   1,000 mcg at 10/06/10 0913    Allergies  Allergen Reactions  . Shellfish-Derived Products     Patient Active Problem List  Diagnoses  . DIABETES MELLITUS, TYPE II, MILD  . B12 DEFICIENCY  . DYSLIPIDEMIA  . ANEMIA-IRON DEFICIENCY  . ANXIETY  . OBSTRUCTIVE SLEEP APNEA  . GLAUCOMA  . HYPERTENSION  .  AORTIC STENOSIS  . PHARYNGITIS  . ESOPHAGEAL REFLUX  . GASTRIC ULCER  . GASTRITIS  . DIVERTICULOSIS, COLON  . GI BLEED  . CHEST PAIN  . BLOOD IN STOOL, OCCULT  . Systolic hypertension  . Sinus bradycardia  . CAD (coronary artery disease)  . History of prosthetic aortic valve  . History of diabetic retinopathy  . History of necrotizing fascIItis  . Postoperative anemia  . Nightmares  . Benign hypertensive heart disease without heart failure  . Hx of CABG    History  Smoking status  . Never Smoker   Smokeless tobacco  . Not on file    History  Alcohol Use: Not on file    Family History  Problem Relation Age of Onset  . Other Father   . Cirrhosis Father   . Alcohol abuse Father   . Rectal cancer Mother   . Diabetes Mother   . Hypertension Sister   . Diabetes Sister     Review of Systems: Constitutional: no fever chills diaphoresis or fatigue or change in weight.    Head and neck: no hearing loss, no epistaxis, no photophobia or visual disturbance. Respiratory: No cough, shortness of breath or wheezing. Cardiovascular: No chest pain peripheral edema, palpitations. Gastrointestinal: No abdominal distention, no abdominal pain, no change in bowel habits hematochezia or melena. Genitourinary: No dysuria, no frequency, no urgency, no nocturia. Musculoskeletal:No arthralgias, no back pain, no gait disturbance or myalgias. Neurological: No dizziness, no headaches, no numbness, no seizures, no syncope, no weakness, no tremors. Hematologic: No lymphadenopathy, no easy bruising. Psychiatric: No confusion, no hallucinations, no sleep disturbance.    Physical Exam: Filed Vitals:   10/13/10 1026  BP: 150/80  Pulse: 72  Weight is 204, down 6 pounds.  The general appearance is that of a slightly overweight gentleman in no acute distress.  He has had some recent dermatologic abrasive chemical peels to his scalp.  Otherwise normal head and neck.The chest is clear to percussion and auscultation. There are no rales or rhonchi. Expansion of the chest is symmetrical.The precordium is quiet.  The first heart sound is normal.  The second heart sound is physiologically split.  There is no  gallop rub or click.  There is no abnormal lift or heave.He has a grade 2/6 systolic ejection murmur across his prosthetic aortic valve.The abdomen is soft and nontender. Bowel sounds are normal. The liver and spleen are not enlarged. There Are no abdominal masses. There are no bruits.The pedal pulses are good.  There is no phlebitis or edema.  There is no cyanosis or clubbing.Strength is normal and symmetrical in all extremities.  There is no lateralizing weakness.  There are no sensory deficits.   Assessment / Plan:  We reviewed his current medications.  Because his blood pressure is remaining elevated we're going to add hydrochlorothiazide 12.5 mg daily to his current regimen.  He will be  having a checkup soon with Dr. Eloise Harman.  We will plan to see him back in 6 months for followup office visit.

## 2010-10-13 NOTE — Assessment & Plan Note (Signed)
The patient has continued to have elevated blood pressure when he checks it at home.  He is not having any chest pain or dizziness or syncope.  He denies any headaches.  He tries to limit his dietary salt intake.  He has several cups of caffeine in the morning.  He is working part-time now and so he has Less job stress and Shorter hours

## 2010-10-13 NOTE — Assessment & Plan Note (Signed)
The patient has not been experiencing any recurrent angina pectoris. 

## 2010-10-13 NOTE — Assessment & Plan Note (Signed)
The patient is due to have a medical checkup with Dr. Dossie Arbour soon and he will be getting blood work at that time to followup on his cholesterol.

## 2010-10-20 LAB — COMPREHENSIVE METABOLIC PANEL
ALT: 18 U/L (ref 0–53)
AST: 29 U/L (ref 0–37)
Albumin: 3.8 g/dL (ref 3.5–5.2)
Alkaline Phosphatase: 36 U/L — ABNORMAL LOW (ref 39–117)
BUN: 11 mg/dL (ref 6–23)
CO2: 24 mEq/L (ref 19–32)
Calcium: 9.3 mg/dL (ref 8.4–10.5)
Chloride: 110 mEq/L (ref 96–112)
Creatinine, Ser: 0.96 mg/dL (ref 0.4–1.5)
GFR calc Af Amer: >60 mL/min (ref >60)
GFR calc non Af Amer: >60 mL/min (ref >60)
Glucose, Bld: 52 mg/dL — ABNORMAL LOW (ref 70–99)
Potassium: 4.1 mEq/L (ref 3.5–5.1)
Sodium: 142 mEq/L (ref 135–145)
Total Bilirubin: 0.7 mg/dL (ref 0.3–1.2)
Total Protein: 6 g/dL (ref 6.0–8.3)

## 2010-10-20 LAB — BASIC METABOLIC PANEL
BUN: 17 mg/dL (ref 6–23)
BUN: 21 mg/dL (ref 6–23)
BUN: 10 mg/dL (ref 6–23)
BUN: 10 mg/dL (ref 6–23)
BUN: 18 mg/dL (ref 6–23)
BUN: 24 mg/dL — ABNORMAL HIGH (ref 6–23)
CO2: 27 mEq/L (ref 19–32)
CO2: 27 mEq/L (ref 19–32)
CO2: 25 mEq/L (ref 19–32)
CO2: 26 mEq/L (ref 19–32)
CO2: 28 mEq/L (ref 19–32)
CO2: 29 mEq/L (ref 19–32)
Calcium: 8.5 mg/dL (ref 8.4–10.5)
Calcium: 8.8 mg/dL (ref 8.4–10.5)
Calcium: 7.8 mg/dL — ABNORMAL LOW (ref 8.4–10.5)
Calcium: 7.8 mg/dL — ABNORMAL LOW (ref 8.4–10.5)
Calcium: 8.5 mg/dL (ref 8.4–10.5)
Calcium: 8.6 mg/dL (ref 8.4–10.5)
Chloride: 104 mEq/L (ref 96–112)
Chloride: 104 mEq/L (ref 96–112)
Chloride: 105 mEq/L (ref 96–112)
Chloride: 106 mEq/L (ref 96–112)
Chloride: 107 mEq/L (ref 96–112)
Chloride: 108 mEq/L (ref 96–112)
Chloride: 108 mEq/L (ref 96–112)
Creatinine, Ser: 1.07 mg/dL (ref 0.4–1.5)
Creatinine, Ser: 1.14 mg/dL (ref 0.4–1.5)
Creatinine, Ser: 1.15 mg/dL (ref 0.4–1.5)
Creatinine, Ser: 0.96 mg/dL (ref 0.4–1.5)
Creatinine, Ser: 1.04 mg/dL (ref 0.4–1.5)
Creatinine, Ser: 1.36 mg/dL (ref 0.4–1.5)
Creatinine, Ser: 1.69 mg/dL — ABNORMAL HIGH (ref 0.4–1.5)
GFR calc Af Amer: >60 mL/min (ref >60)
GFR calc Af Amer: >60 mL/min (ref >60)
GFR calc Af Amer: 50 mL/min — ABNORMAL LOW (ref >60)
GFR calc Af Amer: >60 mL/min (ref >60)
GFR calc Af Amer: >60 mL/min (ref >60)
GFR calc Af Amer: >60 mL/min (ref >60)
GFR calc non Af Amer: >60 mL/min (ref >60)
GFR calc non Af Amer: >60 mL/min (ref >60)
GFR calc non Af Amer: 41 mL/min — ABNORMAL LOW (ref >60)
GFR calc non Af Amer: 53 mL/min — ABNORMAL LOW (ref >60)
GFR calc non Af Amer: >60 mL/min (ref >60)
GFR calc non Af Amer: >60 mL/min (ref >60)
Glucose, Bld: 109 mg/dL — ABNORMAL HIGH (ref 70–99)
Glucose, Bld: 126 mg/dL — ABNORMAL HIGH (ref 70–99)
Glucose, Bld: 124 mg/dL — ABNORMAL HIGH (ref 70–99)
Glucose, Bld: 148 mg/dL — ABNORMAL HIGH (ref 70–99)
Glucose, Bld: 148 mg/dL — ABNORMAL HIGH (ref 70–99)
Glucose, Bld: 167 mg/dL — ABNORMAL HIGH (ref 70–99)
Potassium: 3.6 mEq/L (ref 3.5–5.1)
Potassium: 3.7 mEq/L (ref 3.5–5.1)
Potassium: 3.8 mEq/L (ref 3.5–5.1)
Potassium: 4 mEq/L (ref 3.5–5.1)
Potassium: 4 mEq/L (ref 3.5–5.1)
Potassium: 4.6 mEq/L (ref 3.5–5.1)
Sodium: 138 mEq/L (ref 135–145)
Sodium: 139 mEq/L (ref 135–145)
Sodium: 138 mEq/L (ref 135–145)
Sodium: 139 mEq/L (ref 135–145)
Sodium: 140 mEq/L (ref 135–145)
Sodium: 141 mEq/L (ref 135–145)

## 2010-10-20 LAB — GLUCOSE, CAPILLARY
Glucose-Capillary: 121 mg/dL — ABNORMAL HIGH (ref 70–99)
Glucose-Capillary: 143 mg/dL — ABNORMAL HIGH (ref 70–99)
Glucose-Capillary: 174 mg/dL — ABNORMAL HIGH (ref 70–99)
Glucose-Capillary: 175 mg/dL — ABNORMAL HIGH (ref 70–99)
Glucose-Capillary: 176 mg/dL — ABNORMAL HIGH (ref 70–99)
Glucose-Capillary: 177 mg/dL — ABNORMAL HIGH (ref 70–99)
Glucose-Capillary: 234 mg/dL — ABNORMAL HIGH (ref 70–99)
Glucose-Capillary: 263 mg/dL — ABNORMAL HIGH (ref 70–99)
Glucose-Capillary: 117 mg/dL — ABNORMAL HIGH (ref 70–99)
Glucose-Capillary: 118 mg/dL — ABNORMAL HIGH (ref 70–99)
Glucose-Capillary: 121 mg/dL — ABNORMAL HIGH (ref 70–99)
Glucose-Capillary: 135 mg/dL — ABNORMAL HIGH (ref 70–99)
Glucose-Capillary: 140 mg/dL — ABNORMAL HIGH (ref 70–99)
Glucose-Capillary: 144 mg/dL — ABNORMAL HIGH (ref 70–99)
Glucose-Capillary: 145 mg/dL — ABNORMAL HIGH (ref 70–99)
Glucose-Capillary: 145 mg/dL — ABNORMAL HIGH (ref 70–99)
Glucose-Capillary: 154 mg/dL — ABNORMAL HIGH (ref 70–99)
Glucose-Capillary: 167 mg/dL — ABNORMAL HIGH (ref 70–99)
Glucose-Capillary: 170 mg/dL — ABNORMAL HIGH (ref 70–99)
Glucose-Capillary: 179 mg/dL — ABNORMAL HIGH (ref 70–99)
Glucose-Capillary: 179 mg/dL — ABNORMAL HIGH (ref 70–99)
Glucose-Capillary: 191 mg/dL — ABNORMAL HIGH (ref 70–99)
Glucose-Capillary: 192 mg/dL — ABNORMAL HIGH (ref 70–99)
Glucose-Capillary: 264 mg/dL — ABNORMAL HIGH (ref 70–99)
Glucose-Capillary: 28 mg/dL — CL (ref 70–99)
Glucose-Capillary: 32 mg/dL — CL (ref 70–99)
Glucose-Capillary: 69 mg/dL — ABNORMAL LOW (ref 70–99)
Glucose-Capillary: 75 mg/dL (ref 70–99)
Glucose-Capillary: 75 mg/dL (ref 70–99)
Glucose-Capillary: 89 mg/dL (ref 70–99)
Glucose-Capillary: 91 mg/dL (ref 70–99)
Glucose-Capillary: 94 mg/dL (ref 70–99)
Glucose-Capillary: 98 mg/dL (ref 70–99)

## 2010-10-20 LAB — CBC
HCT: 23.9 % — ABNORMAL LOW (ref 39.0–52.0)
HCT: 24.9 % — ABNORMAL LOW (ref 39.0–52.0)
HCT: 20.3 % — ABNORMAL LOW (ref 39.0–52.0)
HCT: 21.6 % — ABNORMAL LOW (ref 39.0–52.0)
HCT: 23.1 % — ABNORMAL LOW (ref 39.0–52.0)
HCT: 23.5 % — ABNORMAL LOW (ref 39.0–52.0)
HCT: 24.5 % — ABNORMAL LOW (ref 39.0–52.0)
HCT: 24.7 % — ABNORMAL LOW (ref 39.0–52.0)
HCT: 27.6 % — ABNORMAL LOW (ref 39.0–52.0)
HCT: 33.5 % — ABNORMAL LOW (ref 39.0–52.0)
Hemoglobin: 8.2 g/dL — ABNORMAL LOW (ref 13.0–17.0)
Hemoglobin: 8.6 g/dL — ABNORMAL LOW (ref 13.0–17.0)
Hemoglobin: 10.8 g/dL — ABNORMAL LOW (ref 13.0–17.0)
Hemoglobin: 6.6 g/dL — CL (ref 13.0–17.0)
Hemoglobin: 7.1 g/dL — CL (ref 13.0–17.0)
Hemoglobin: 7.6 g/dL — CL (ref 13.0–17.0)
Hemoglobin: 7.7 g/dL — CL (ref 13.0–17.0)
Hemoglobin: 8.1 g/dL — ABNORMAL LOW (ref 13.0–17.0)
Hemoglobin: 8.2 g/dL — ABNORMAL LOW (ref 13.0–17.0)
Hemoglobin: 9.2 g/dL — ABNORMAL LOW (ref 13.0–17.0)
MCHC: 33.1 g/dL (ref 30.0–36.0)
MCHC: 33.6 g/dL (ref 30.0–36.0)
MCHC: 33.7 g/dL (ref 30.0–36.0)
MCHC: 32.2 g/dL (ref 30.0–36.0)
MCHC: 32.3 g/dL (ref 30.0–36.0)
MCHC: 32.7 g/dL (ref 30.0–36.0)
MCHC: 32.7 g/dL (ref 30.0–36.0)
MCHC: 33.2 g/dL (ref 30.0–36.0)
MCHC: 33.2 g/dL (ref 30.0–36.0)
MCHC: 33.4 g/dL (ref 30.0–36.0)
MCHC: 33.5 g/dL (ref 30.0–36.0)
MCV: 80.1 fL (ref 78.0–100.0)
MCV: 80.7 fL (ref 78.0–100.0)
MCV: 78.4 fL (ref 78.0–100.0)
MCV: 78.9 fL (ref 78.0–100.0)
MCV: 79.2 fL (ref 78.0–100.0)
MCV: 79.5 fL (ref 78.0–100.0)
MCV: 79.5 fL (ref 78.0–100.0)
MCV: 79.9 fL (ref 78.0–100.0)
MCV: 80.4 fL (ref 78.0–100.0)
MCV: 80.5 fL (ref 78.0–100.0)
Platelets: 105 10*3/uL — ABNORMAL LOW (ref 150–400)
Platelets: 129 10*3/uL — ABNORMAL LOW (ref 150–400)
Platelets: 113 10*3/uL — ABNORMAL LOW (ref 150–400)
Platelets: 184 10*3/uL (ref 150–400)
Platelets: 85 10*3/uL — ABNORMAL LOW (ref 150–400)
Platelets: 92 10*3/uL — ABNORMAL LOW (ref 150–400)
Platelets: 94 10*3/uL — ABNORMAL LOW (ref 150–400)
Platelets: 95 10*3/uL — ABNORMAL LOW (ref 150–400)
Platelets: 96 10*3/uL — ABNORMAL LOW (ref 150–400)
Platelets: 96 10*3/uL — ABNORMAL LOW (ref 150–400)
RBC: 3.09 MIL/uL — ABNORMAL LOW (ref 4.22–5.81)
RBC: 3.18 MIL/uL — ABNORMAL LOW (ref 4.22–5.81)
RBC: 2.57 MIL/uL — ABNORMAL LOW (ref 4.22–5.81)
RBC: 2.74 MIL/uL — ABNORMAL LOW (ref 4.22–5.81)
RBC: 2.9 MIL/uL — ABNORMAL LOW (ref 4.22–5.81)
RBC: 2.95 MIL/uL — ABNORMAL LOW (ref 4.22–5.81)
RBC: 3.07 MIL/uL — ABNORMAL LOW (ref 4.22–5.81)
RBC: 3.12 MIL/uL — ABNORMAL LOW (ref 4.22–5.81)
RBC: 3.43 MIL/uL — ABNORMAL LOW (ref 4.22–5.81)
RBC: 4.21 MIL/uL — ABNORMAL LOW (ref 4.22–5.81)
RDW: 18 % — ABNORMAL HIGH (ref 11.5–15.5)
RDW: 18.3 % — ABNORMAL HIGH (ref 11.5–15.5)
RDW: 17.4 % — ABNORMAL HIGH (ref 11.5–15.5)
RDW: 17.8 % — ABNORMAL HIGH (ref 11.5–15.5)
RDW: 17.8 % — ABNORMAL HIGH (ref 11.5–15.5)
RDW: 17.9 % — ABNORMAL HIGH (ref 11.5–15.5)
RDW: 18 % — ABNORMAL HIGH (ref 11.5–15.5)
RDW: 18 % — ABNORMAL HIGH (ref 11.5–15.5)
RDW: 18.3 % — ABNORMAL HIGH (ref 11.5–15.5)
RDW: 18.8 % — ABNORMAL HIGH (ref 11.5–15.5)
WBC: 5 10*3/uL (ref 4.0–10.5)
WBC: 5.4 10*3/uL (ref 4.0–10.5)
WBC: 12.6 10*3/uL — ABNORMAL HIGH (ref 4.0–10.5)
WBC: 6.1 10*3/uL (ref 4.0–10.5)
WBC: 6.3 10*3/uL (ref 4.0–10.5)
WBC: 6.6 10*3/uL (ref 4.0–10.5)
WBC: 7.2 10*3/uL (ref 4.0–10.5)
WBC: 7.5 10*3/uL (ref 4.0–10.5)
WBC: 8.3 10*3/uL (ref 4.0–10.5)
WBC: 9.4 10*3/uL (ref 4.0–10.5)

## 2010-10-20 LAB — URINALYSIS, ROUTINE W REFLEX MICROSCOPIC
Bilirubin Urine: NEGATIVE
Glucose, UA: NEGATIVE mg/dL
Hgb urine dipstick: NEGATIVE
Ketones, ur: NEGATIVE mg/dL
Nitrite: NEGATIVE
Protein, ur: NEGATIVE mg/dL
Specific Gravity, Urine: 1.025 (ref 1.005–1.030)
Urobilinogen, UA: 0.2 mg/dL (ref 0.0–1.0)
pH: 5.5 (ref 5.0–8.0)

## 2010-10-20 LAB — POCT I-STAT, CHEM 8
BUN: 9 mg/dL (ref 6–23)
Calcium, Ion: 1.26 mmol/L (ref 1.12–1.32)
Chloride: 106 mEq/L (ref 96–112)
Creatinine, Ser: 0.9 mg/dL (ref 0.4–1.5)
Glucose, Bld: 131 mg/dL — ABNORMAL HIGH (ref 70–99)
HCT: 23 % — ABNORMAL LOW (ref 39.0–52.0)
Hemoglobin: 7.8 g/dL — CL (ref 13.0–17.0)
Potassium: 4 mEq/L (ref 3.5–5.1)
Sodium: 142 mEq/L (ref 135–145)
TCO2: 25 mmol/L (ref 0–100)

## 2010-10-20 LAB — POCT I-STAT 3, ART BLOOD GAS (G3+)
Acid-base deficit: 2 mmol/L (ref 0.0–2.0)
Acid-base deficit: 4 mmol/L — ABNORMAL HIGH (ref 0.0–2.0)
Bicarbonate: 22.2 mEq/L (ref 20.0–24.0)
Bicarbonate: 23.7 mEq/L (ref 20.0–24.0)
Bicarbonate: 24.7 mEq/L — ABNORMAL HIGH (ref 20.0–24.0)
O2 Saturation: 100 %
O2 Saturation: 97 %
O2 Saturation: 98 %
Patient temperature: 36.3
Patient temperature: 36.8
TCO2: 23 mmol/L (ref 0–100)
TCO2: 25 mmol/L (ref 0–100)
TCO2: 26 mmol/L (ref 0–100)
pCO2 arterial: 39.3 mmHg (ref 35.0–45.0)
pCO2 arterial: 42.3 mmHg (ref 35.0–45.0)
pCO2 arterial: 42.3 mmHg (ref 35.0–45.0)
pH, Arterial: 7.327 — ABNORMAL LOW (ref 7.350–7.450)
pH, Arterial: 7.353 (ref 7.350–7.450)
pH, Arterial: 7.406 (ref 7.350–7.450)
pO2, Arterial: 121 mmHg — ABNORMAL HIGH (ref 80.0–100.0)
pO2, Arterial: 361 mmHg — ABNORMAL HIGH (ref 80.0–100.0)
pO2, Arterial: 93 mmHg (ref 80.0–100.0)

## 2010-10-20 LAB — TYPE AND SCREEN
ABO/RH(D): A POS
Antibody Screen: NEGATIVE

## 2010-10-20 LAB — BLOOD GAS, ARTERIAL
Acid-Base Excess: 0.6 mmol/L (ref 0.0–2.0)
Bicarbonate: 24.6 mEq/L — ABNORMAL HIGH (ref 20.0–24.0)
Drawn by: 20636
FIO2: 0.21 %
O2 Saturation: 96.5 %
Patient temperature: 98.6
TCO2: 25.7 mmol/L (ref 0–100)
pCO2 arterial: 38.4 mmHg (ref 35.0–45.0)
pH, Arterial: 7.422 (ref 7.350–7.450)
pO2, Arterial: 80.5 mmHg (ref 80.0–100.0)

## 2010-10-20 LAB — POCT I-STAT 4, (NA,K, GLUC, HGB,HCT)
Glucose, Bld: 100 mg/dL — ABNORMAL HIGH (ref 70–99)
Glucose, Bld: 123 mg/dL — ABNORMAL HIGH (ref 70–99)
Glucose, Bld: 136 mg/dL — ABNORMAL HIGH (ref 70–99)
Glucose, Bld: 140 mg/dL — ABNORMAL HIGH (ref 70–99)
Glucose, Bld: 56 mg/dL — ABNORMAL LOW (ref 70–99)
Glucose, Bld: 72 mg/dL (ref 70–99)
Glucose, Bld: 94 mg/dL (ref 70–99)
HCT: 23 % — ABNORMAL LOW (ref 39.0–52.0)
HCT: 24 % — ABNORMAL LOW (ref 39.0–52.0)
HCT: 25 % — ABNORMAL LOW (ref 39.0–52.0)
HCT: 25 % — ABNORMAL LOW (ref 39.0–52.0)
HCT: 27 % — ABNORMAL LOW (ref 39.0–52.0)
HCT: 28 % — ABNORMAL LOW (ref 39.0–52.0)
HCT: 30 % — ABNORMAL LOW (ref 39.0–52.0)
Hemoglobin: 10.2 g/dL — ABNORMAL LOW (ref 13.0–17.0)
Hemoglobin: 7.8 g/dL — CL (ref 13.0–17.0)
Hemoglobin: 8.2 g/dL — ABNORMAL LOW (ref 13.0–17.0)
Hemoglobin: 8.5 g/dL — ABNORMAL LOW (ref 13.0–17.0)
Hemoglobin: 8.5 g/dL — ABNORMAL LOW (ref 13.0–17.0)
Hemoglobin: 9.2 g/dL — ABNORMAL LOW (ref 13.0–17.0)
Hemoglobin: 9.5 g/dL — ABNORMAL LOW (ref 13.0–17.0)
Potassium: 3.4 mEq/L — ABNORMAL LOW (ref 3.5–5.1)
Potassium: 3.4 mEq/L — ABNORMAL LOW (ref 3.5–5.1)
Potassium: 3.6 mEq/L (ref 3.5–5.1)
Potassium: 3.7 mEq/L (ref 3.5–5.1)
Potassium: 3.8 mEq/L (ref 3.5–5.1)
Potassium: 3.9 mEq/L (ref 3.5–5.1)
Potassium: 4.2 mEq/L (ref 3.5–5.1)
Sodium: 139 mEq/L (ref 135–145)
Sodium: 140 mEq/L (ref 135–145)
Sodium: 141 mEq/L (ref 135–145)
Sodium: 142 mEq/L (ref 135–145)
Sodium: 142 mEq/L (ref 135–145)
Sodium: 142 mEq/L (ref 135–145)
Sodium: 144 mEq/L (ref 135–145)

## 2010-10-20 LAB — MAGNESIUM
Magnesium: 2.1 mg/dL (ref 1.5–2.5)
Magnesium: 2.3 mg/dL (ref 1.5–2.5)
Magnesium: 2.4 mg/dL (ref 1.5–2.5)

## 2010-10-20 LAB — CREATININE, SERUM
Creatinine, Ser: 0.91 mg/dL (ref 0.4–1.5)
Creatinine, Ser: 1.23 mg/dL (ref 0.4–1.5)
GFR calc Af Amer: >60 mL/min (ref >60)
GFR calc Af Amer: >60 mL/min (ref >60)
GFR calc non Af Amer: 59 mL/min — ABNORMAL LOW (ref >60)
GFR calc non Af Amer: >60 mL/min (ref >60)

## 2010-10-20 LAB — APTT
aPTT: 29 seconds (ref 24–37)
aPTT: 43 seconds — ABNORMAL HIGH (ref 24–37)

## 2010-10-20 LAB — PROTIME-INR
INR: 1 (ref 0.00–1.49)
INR: 1.7 — ABNORMAL HIGH (ref 0.00–1.49)
Prothrombin Time: 13.6 seconds (ref 11.6–15.2)
Prothrombin Time: 20.7 seconds — ABNORMAL HIGH (ref 11.6–15.2)

## 2010-10-20 LAB — HEMOGLOBIN A1C
Hgb A1c MFr Bld: 5.8 % (ref 4.6–6.1)
Mean Plasma Glucose: 120 mg/dL

## 2010-10-20 LAB — HEMOGLOBIN AND HEMATOCRIT, BLOOD
HCT: 22.7 % — ABNORMAL LOW (ref 39.0–52.0)
Hemoglobin: 7.4 g/dL — CL (ref 13.0–17.0)

## 2010-10-20 LAB — PLATELET COUNT: Platelets: 120 10*3/uL — ABNORMAL LOW (ref 150–400)

## 2010-10-20 LAB — HEMOCCULT GUIAC POC 1CARD (OFFICE)
Fecal Occult Bld: NEGATIVE
Fecal Occult Bld: NEGATIVE

## 2010-10-21 LAB — GLUCOSE, CAPILLARY
Glucose-Capillary: 182 mg/dL — ABNORMAL HIGH (ref 70–99)
Glucose-Capillary: 81 mg/dL (ref 70–99)

## 2010-10-21 LAB — POCT I-STAT 3, VENOUS BLOOD GAS (G3P V)
Bicarbonate: 24.7 mEq/L — ABNORMAL HIGH (ref 20.0–24.0)
O2 Saturation: 72 %
TCO2: 26 mmol/L (ref 0–100)
pO2, Ven: 39 mmHg (ref 30.0–45.0)

## 2010-10-21 LAB — POCT I-STAT 3, ART BLOOD GAS (G3+)
Bicarbonate: 23.5 mEq/L (ref 20.0–24.0)
TCO2: 25 mmol/L (ref 0–100)
pH, Arterial: 7.41 (ref 7.350–7.450)
pO2, Arterial: 72 mmHg — ABNORMAL LOW (ref 80.0–100.0)

## 2010-10-27 ENCOUNTER — Encounter: Payer: Self-pay | Admitting: Cardiology

## 2010-11-10 ENCOUNTER — Ambulatory Visit (INDEPENDENT_AMBULATORY_CARE_PROVIDER_SITE_OTHER): Payer: Medicare Other | Admitting: Gastroenterology

## 2010-11-10 DIAGNOSIS — E538 Deficiency of other specified B group vitamins: Secondary | ICD-10-CM

## 2010-11-23 NOTE — Cardiovascular Report (Signed)
Nathan [name], Nathan [name] NO.:  [id number]   MEDICAL RECORD NO.:  [id number]          PATIENT TYPE:  OIB   LOCATION:  1963                         FACILITY:  [facility]   PHYSICIAN:  Peter M. Swaziland, M.D.  DATE OF BIRTH:  01/31/1946   DATE OF PROCEDURE:  09/30/2008  DATE OF DISCHARGE:  09/30/2008                            CARDIAC CATHETERIZATION   INDICATIONS FOR PROCEDURE:  The patient is a 65 year old white male who  has moderate-to-severe aortic stenosis by echocardiogram.  He also had a  previous abnormal stress Cardiolite study showing evidence of apical  ischemia.  He has a history of obesity, obstructive sleep apnea,  hypertension, and hypercholesterolemia.   PROCEDURES:  1. Right and left heart catheterization.  2. Coronary and left ventricular angiography.   ACCESS:  Via the right femoral artery and vein using standard Seldinger  technique.   EQUIPMENT USED:  A 5-French arterial sheath, 5-French multipurpose  catheter, 4-French 3DRC catheter, 4-French left Judkins 5 catheter, 7-  French venous sheath, and 5-French balloon-tipped Swan-Ganz catheter.   MEDICATIONS:  Local anesthesia 1% Xylocaine, Versed 2 mg IV.   CONTRAST:  100 mL of Omnipaque.   HEMODYNAMIC DATA:  Right atrial pressure 16/11 with a mean of 9 mmHg.  Right ventricular pressure is 39 with EDP of 12 mmHg.  Pulmonary artery  pressure is 34/40 with a mean of 25 mmHg.  Pulmonary capillary wedge  pressure is 21/27 with a mean of 18 mmHg.  The aortic pressure is 158/68  with a mean of 101.  Left ventricular pressure is 188 with an EDP of 26  mmHg.  Mean aortic valve gradient was 27 mmHg with a valve area of 1.19  cm2.  The valve index of 0.58.  Cardiac output by thermodilution was 5.5  L/min with an index of 2.7.  By Hiram Comber, cardiac output was 8 L/min with an  index of 3.9.  Oxygen saturation was 94% for the aorta and 72% for  pulmonary artery.  There was no significant mitral valve gradient.   ANGIOGRAPHIC DATA:  Left ventricular angiography was performed in the  RAO view.  This demonstrates normal left ventricular size with  hyperdynamic contractility and overall ejection fraction of 70-75%.  The  aortic valve was heavily calcified.  There was also moderate mitral  annular calcification.   CORONARY ANGIOGRAPHY:  Left main coronary artery appears normal.   Left anterior descending artery was diffusely heavily calcified.  There  is 80-90% stenosis in the proximal vessel and in the midvessel.  There  are 3 very small diagonal branches.  They have mild atherosclerosis.   The left circumflex coronary artery gives rise to 2 very small marginal  vessels in the proximal mid vessel.  There is a moderate-sized marginal  vessel that is occluded proximally and fills by left-to-left  collaterals.  The both marginal vessel and terminal marginal vessels are  large vessel.  There is an 80% stenosis in the mid circumflex followed  by 90% stenosis before the large terminal branch.  Again, the third  obtuse marginal vessel is occluded.   The right coronary  rises and distributes normally.  It is also  moderately calcified.  There is 50-60% disease in the mid right coronary  artery and diffuse 30% disease distally.   FINAL INTERPRETATION:  1. Severe three-vessel obstructive coronary artery disease.  2. Normal left ventricular function.  3. Moderate aortic stenosis.  4. Normal right heart pressures.   PLAN:  We would recommend referral for coronary artery bypass surgery  and aortic valve replacement.           ______________________________  Peter M. Swaziland, M.D.     PMJ/MEDQ  D:  09/30/2008  T:  10/01/2008  Job:  [id number]   cc:   Cassell Clement, M.D.  Vania Rea. Jarold Motto, MD, Cale[facility], FAGA  Barry Dienes Eloise Harman, M.D.

## 2010-11-23 NOTE — Op Note (Signed)
NAMEHARL, Nathan [name] NO.:  [id number]   MEDICAL RECORD NO.:  [id number]          PATIENT TYPE:  INP   LOCATION:  2016                         FACILITY:  [facility]   PHYSICIAN:  Sheliah Plane, MD    DATE OF BIRTH:  Apr 16, 1946   DATE OF PROCEDURE:  10/20/2008  DATE OF DISCHARGE:                               OPERATIVE REPORT   PREOPERATIVE DIAGNOSES:  Aortic stenosis and three-vessel coronary  artery disease.   POSTOPERATIVE DIAGNOSES:  Aortic stenosis and three-vessel coronary  artery disease.   SURGICAL PROCEDURES:  Coronary artery bypass grafting x3 with the left  internal mammary to the left anterior descending coronary artery,  reverse saphenous vein graft to circumflex coronary artery, reverse  saphenous vein graft to the posterior descending coronary artery and  aortic valve replacement with a pericardial tissue valve.  [name], [name], 23-mm, [name], with  bilateral thigh endovein harvesting.   SURGEON:  Sheliah Plane, MD   FIRST ASSISTANT:  Coral Ceo, PA   BRIEF HISTORY:  The patient is a 65 year old male with known critical  aortic stenosis who in the fall of 2009 was being evaluated for aortic  valve replacement, however, prior to having a cardiac catheterization  developed a major gastrointestinal bleed which was evaluated stabilized  and no definite source was identified.  However, the patient improved  and ultimately had cardiac catheterization which demonstrated  significant three-vessel coronary artery disease with 70-80% stenosis of  the LAD, circumflex and right coronary arteries and critical aortic  stenosis with a valve area of 0.7-0.8.  With the patient's history of GI  bleed, it was recommended to proceed with aortic valve replacement with  a tissue valve and coronary artery bypass grafting.  The patient agreed  and signed informed consent.   DESCRIPTION OF PROCEDURE:  With Swan-Ganz and arterial  line monitors in  place, the patient underwent general endotracheal anesthesia without  incidence.  Skin of the chest and legs were prepped with Betadine and  draped in the usual sterile manner.  Using a [employer] endovein harvesting  system, vein was harvested, however, near the native vein became small.  Additional vein was harvested from the left thigh and was of good  quality.  Median sternotomy was performed.  Left internal mammary artery  was dissected down as pedicle graft.  Distal artery was divided and had  good free flow.  Pericardium was opened.  Overall ventricular function  was preserved.  The patient was systemically heparinized.  Ascending  aorta was cannulated.  The right atrium was cannulated and aortic root  vent cardioplegia needle was introduced into the ascending aorta.  The  patient was placed on cardiopulmonary bypass 2.4 liters per minute per  meter square.  The right superior pulmonary vein vent was placed.  The  patient's body temperature was cooled to 30 degrees.  Aortic crossclamp  was applied and 500 mL of cold blood potassium cardioplegia was  administered with diastolic arrest of the heart.  Myocardial septal  temperature was monitored.  Attention was turned first to the coronary  system.  The distal right coronary artery was opened and was diffusely  diseased vessel.  Using a running 7-0 Prolene, a distal anastomosis was  performed with a second reverse saphenous vein graft.  Attention was  then turned to the circumflex coronary artery which was opened and was a  moderate-sized vessel, admitted a 1.5-mm probe.  Using a running 7-0  Prolene, distal anastomosis was performed.  Parallel to this was a first  obtuse marginal vessel which was subtotally occluded angiographically.  We considered bypassing this, but it appeared very diffusely diseased  and too small to bypass.  Attention was then turned to the left anterior  descending coronary artery which was opened  in the midportion.  Using  running 8-0 Prolene, left internal mammary artery was anastomosed to  left anterior descending coronary artery.  Additional cold blood  cardioplegia was administered.  Attention was then turned to the aortic  valve.  A transverse aortotomy was performed and this gave good exposure  to a trileaflet aortic valve.  The valve was excised and annulus was  debrided and a #23 pericardial tissue valve was sized.  Using #2 Tycron  pledgeted sutures in circumferential manner, the aortic valve was  secured in place.  Care was taken to remove all loose calcific debris.  The transverse aortotomy was closed with a horizontal mattress 4-0  Prolene suture over felt strips.  With the crossclamp still in place, 2  punch aortotomies were performed and each of the 2 vein grafts were  anastomosed to the ascending aorta.  Air was evacuated prior to closure  of the final aortotomy.  The heart was allowed to passively fill and de-  aired the heart and ascending aorta.  Aortic crossclamp was removed with  a total crossclamp time of 129 minutes.  The patient spontaneously  converted to fist a nodal rhythm and ultimately into a slow sinus  rhythm.  He was then atrially paced temporarily with the body  temperature rewarmed to 37 degrees.  The patient was then ventilated and  weaned from cardiopulmonary bypass without difficulty.  The total pump  time was 169 minutes.  The patient was decannulated in the usual  fashion.  Protamine sulfate was administered with operative field  hemostatic.  Two ventricular pacing wires applied.  Graft markers were  applied.  Left pleural tube and 2 mediastinal tubes were left in place.  Sternum was closed with #6 stainless steel wire.  Fascia was closed with  interrupted 0 Vicryl, running 3-0 Vicryl in the subcutaneous tissue, 4-0  subcuticular stitch in skin edges.  Dry dressings were applied.  Sponge  and needle count was reported as correct at the  completion of the  patient procedure.      Sheliah Plane, MD  Electronically Signed     EG/MEDQ  D:  10/22/2008  T:  10/23/2008  Job:  [id number]   cc:   Cassell Clement, M.D.

## 2010-11-23 NOTE — Discharge Summary (Signed)
[name], Nathan NO.:  [id number]   MEDICAL RECORD NO.:  [id number]          PATIENT TYPE:  INP   LOCATION:  2037                         FACILITY:  [facility]   PHYSICIAN:  Sheliah Plane, MD    DATE OF BIRTH:  1945-12-18   DATE OF ADMISSION:  10/20/2008  DATE OF DISCHARGE:                               DISCHARGE SUMMARY   PRIMARY ADMITTING DIAGNOSES:  1. Severe 3-vessel coronary artery disease.  2. Aortic stenosis.   ADDITIONAL/DISCHARGE DIAGNOSES:  1. Severe 3-vessel coronary artery disease.  2. Severe critical aortic stenosis.  3. History of recent Gastrointestinal bleed.  4. History of anemia, secondary to a recent gastrointestinal bleed.  5. Obstructive sleep apnea.  6. Hypertension.  7. Hypercholesterolemia.  8. Type 2 diabetes mellitus.  9. History of diabetic retinopathy, status post laser retinal surgery.  10.History of necrotizing fasciitis, status post multiple debridements      and grafting.  11.Postoperative acute renal insufficiency, resolved.  12.Acute postoperative delirium, resolved.  13.Postoperative anemia, acute-on-chronic.   PROCEDURES PERFORMED:  1. Coronary artery bypass grafting x3 (left internal mammary artery to      the left anterior descending, saphenous vein graft to the      circumflex, saphenous vein graft to the posterior descending).  2. Aortic valve replacement with 23-mm [facility] Ease pericardial      tissue valve.   HISTORY:  The patient is a 65 year old male with a recent history of  shortness of breath on exertion.  He was evaluated by his primary care  physician and was noted to have a heart murmur and was subsequently  referred to Dr. Patty Sermons for further evaluation.  An echocardiogram  showed severe aortic stenosis with a calculated valve area of 0.9.  He  also underwent a Cardiolite stress test, which was positive for  ischemia.  He was originally scheduled to undergo cardiac  catheterization in  December 2009; however, just prior to this procedure  he developed nausea, vomiting, and dark tarry stools and was  subsequently found to have evidence of GI bleeding with severe anemia.  He underwent endoscopy and colonoscopy at that time and was found to  have severe peptic ulcer disease and diverticulosis.  He required  multiple blood transfusions and ultimately stabilized and was able to be  discharged.  Following his recovery, he was referred back to Cardiology  for cardiac catheterization.  This was performed on September 30, 2008, by  Dr. Peter Swaziland and the patient was found to have severe 3-vessel  coronary artery disease, which was not felt to be amenable to  percutaneous intervention.  Specifically, he was noted to have a 70-80%  stenosis of the left anterior descending and severe disease of the  circumflex and right coronary arteries.  He was also noted to have  critical aortic stenosis with a valve area of 0.7-0.8.  Because of these  findings, he was seen in consultation by Dr. Sheliah Plane for  consideration of cardiac surgery.  After review of his films, Dr.  Tyrone Sage agreed that he would benefit from aortic valve replacement and  coronary  artery bypass grafting at this time.  Because of the patient's  recent gastrointestinal bleed, he was felt to be a poor candidate for  mechanical valve, secondary to long-term Coumadin therapy.  He discussed  all risks, benefits, and alternatives of surgery with the patient and  Mr. Diloreto agreed to proceed with surgery.   HOSPITAL COURSE:  He was [facility] to [facility] on October 20, 2008.  He was taken to the operating room where he underwent aortic  valve replacement and coronary artery bypass grafting by Dr. Tyrone Sage.  Please see previously dictated operative report for complete details of  surgery.  He tolerated the procedure well and was transferred to the  SICU in stable condition.  He was able to be extubated shortly  after  surgery.  He was stable and doing well on postop day #1.  He initially  required low-dose Neo-Synephrine for hypotension, but this was weaned  and discontinued over his first postop day.  He also required a  transfusion of packed red blood cells for an acute blood loss anemia.  He remained in the unit for an additional 24 hours observation.  By late  in the day postop day #2, he was ready for transfer to the floor.  He  did develop a mild renal insufficiency postoperatively with a peak  creatinine of 1.69.  He was treated conservatively and was started on  Lasix for mild volume overload.  Since that time, his creatinine has  trended back downward and has settled at baseline around 1.1.  Also,  during his postop course, he developed visual hallucinations and  confusion.  He initially was treated with conservative measures  including discontinuation of all narcotic agents and restart of p.r.n.  Xanax, which he had taken prior to surgery.  This did not improve after  24 hours of conservative management and at that point, psychiatry  consult was obtained.  Dr. Jeanie Sewer saw the patient and at that point,  his symptoms were starting to wax and wane.  Dr. Jeanie Sewer felt that  continued conservative management was indicated as this most likely  represented an acute postoperative delirium.  Over the next 24 hours,  the patient's symptoms completely resolved and he has had no further  issues since that time.  He has remained completely neurologically  intact during his entire hospital stay.  He has required one additional  transfusion of packed red blood cells during his postop course.  His  stools have remained Hemoccult negative.  He continues to remain volume  overloaded and is approximately 3 kg above his preoperative weight with  some lower extremity edema on physical exam.  He is responding well to  Lasix.  He has been restarted on supplemental iron, which he had taken  prior to  surgery.  Otherwise, he has remained stable.  From a cardiac  standpoint, he has maintained normal sinus rhythm and his blood  pressures have been stable.  He has been weaned from supplemental oxygen  and is maintaining O2 sats of greater than 90% on room air.  He has been  restarted on his home diabetes medications and his blood sugars are well  controlled at this time.  His incisions are healing well.   LABORATORIES:  On postop day #4, show a hemoglobin of 8.2, hematocrit  24.9, platelets 105, and white count 5.4.  Sodium 139; potassium 3.7,  which has been repleted; BUN 21; and creatinine 1.15.  His most recent  chest x-ray  was stable with small bilateral pleural effusions and some  bibasilar atelectasis.  He is ambulating with cardiac rehab phase I and  is making good progress.  He is tolerating a regular diet and is having  normal bowel and bladder function.  He will be reevaluated on the  morning of postop day #5.  He will have CBC and a BMET to recheck his  renal function and his anemia.  It is anticipated that if he has  remained stable and continues to improve, he will hopefully be ready for  discharge home.   DISCHARGE MEDICATIONS:  1. Enteric-coated aspirin 325 mg daily.  2. Toprol-XL 25 mg daily.  3. Lasix 40 mg daily x 1 week.  4. Potassium 20 mEq daily x1 week.  5. Ultram 50-100 mg q.4 h. p.r.n. for pain.  6. Avandamet 4 mg 10/998 b.i.d.  7. Glipizide 10 mg daily.  8. Crestor 5 mg daily.  9. Nuvigil 150 mg p.r.n.  10.Travatan ophthalmic drops 1 drop in each eye at bedtime.  11.Omeprazole 20 mg daily.  12.Poly-iron 150 mg daily.  13.Alprazolam 0.25 mg p.r.n.  14.Multivitamin daily.  15.Niacin 500 mg daily.  16.Vitamin D 1000 international units daily.   DISCHARGE INSTRUCTIONS:  He is asked to refrain from driving, heavy  lifting or strenuous activity.  He may continue ambulating daily and  using his incentive spirometer.  He may shower daily and clean his   incisions with soap and water.  He will continue low-fat, low-sodium,  carbohydrate-modified medium calorie diet.   DISCHARGE FOLLOWUP:  He will need to make an appointment to see Dr.  Patty Sermons in 2 weeks.  He will then follow up with Dr. Tyrone Sage on Nov 13, 2008, with a chest x-ray from [facility] imaging.  In the interim, if  he experiences problems or has questions, he is asked to contact our  office immediately.      Coral Ceo, P.A.      Sheliah Plane, MD  Electronically Signed    GC/MEDQ  D:  10/24/2008  T:  10/24/2008  Job:  [id number]   cc:   Barry Dienes. Eloise Harman, M.D.  Cassell Clement, M.D.  [facility]

## 2010-11-23 NOTE — Discharge Summary (Signed)
NAMEZAKK, BORGEN NO.:  [id number]   MEDICAL RECORD NO.:  [id number]          PATIENT TYPE:  INP   LOCATION:  2037                         FACILITY:  [facility]   PHYSICIAN:  Sheliah Plane, MD    DATE OF BIRTH:  1945/07/29   DATE OF ADMISSION:  10/20/2008  DATE OF DISCHARGE:  10/26/2008                               DISCHARGE SUMMARY   ADDENDUM   [name] was tentatively scheduled for discharge on October 25, 2008.  On that day, it was noted on his telemetry that he was having short runs  of ventricular tachycardia.  His beta-blocker was adjusted.  The patient  had no further events on the heart monitor.  In addition, the patient  was also restarted on his Avandamet, which he was on preoperatively.  The Avandamet dosage was 4 mg/1000 mg twice daily.  He was also started  on Niferex 150 mg daily.  His other medications are as previously  dictated in the summary.  For full details of this hospitalization,  please see the previously dictated summary.      Nathan [name], P.A.-C.      Sheliah Plane, MD  Electronically Signed    WEG/MEDQ  D:  11/05/2008  T:  11/06/2008  Job:  [id number]   cc:   Cassell Clement, M.D.  Ivery Quale, MD

## 2010-11-23 NOTE — H&P (Signed)
NAMELADARRIAN, ASENCIO NO.:  [id number]   MEDICAL RECORD NO.:  [id number]          PATIENT TYPE:  OIB   LOCATION:  NA                           FACILITY:  [facility]   PHYSICIAN:  Peter M. Swaziland, M.D.  DATE OF BIRTH:  [birth year]   DATE OF ADMISSION:  09/30/2008  DATE OF DISCHARGE:                              HISTORY & PHYSICAL   HISTORY OF PRESENT ILLNESS:  [name] is a 65 year old white male who  is admitted to undergo cardiac catheterization.  The patient has a  history of increased dyspnea on exertion.  Echocardiogram in August of  2009 showed moderate-to-severe aortic stenosis with a valve area of 0.9  sq cm and a peak instantaneous gradient of 70 mmHg and a mean gradient  of 43 mmHg.  Left ventricular function was normal at that time.  Because  of his progressive dyspnea, he underwent a stress Cardiolite study by  Dr. Patty Sermons.  He exercised for 6 minutes and 41 seconds.  He had right-  sided chest pain and dyspnea on exertion.  He had inferolateral ST-  segment depression and Cardiolite images demonstrated evidence of apical  ischemia.  His ejection fraction was 55%.  The patient was originally  scheduled to undergo cardiac catheterization in the end of December.  However, prior to that procedure being done, he presented to the  hospital with nausea associated with dark tarry bowel movements and dry  heaves.  He was subsequently diagnosed with a GI bleed.  Evaluation by  Dr. Rinaldo Cloud demonstrated peptic ulcer disease.  Colonoscopy was  marked for severe diverticulosis.  There were no polyps or cancers.  The  patient was treated and subsequently had documented healing by Dr.  Eloise Harman.  He was okayed at this point to proceed with his cardiac  evaluation.  Even with resolution of his anemia and bleeding, he has had  persistent symptoms of fatigue and exertional shortness of breath.  He  denies any significant chest pain.   PAST MEDICAL HISTORY:  1.  History of GI bleed, due to peptic ulcer disease.  2. Diverticulosis.  3. History of obstructive sleep apnea.  Patient has been intolerant to      CPAP therapy in the past.  4. History of chronic nightmares.  5. History of necrotizing fasciitis several years ago, which required      7 surgeries with extensive reconstruction in the perineum.  He has      also had previous left inguinal hernia repair and he has had laser      treatments to both eyes for glaucoma and diabetic retinopathy.   CURRENT MEDICATIONS:  1. Avandamet 10/998 mg b.i.d.  2. Glipizide XL 10 mg per day.  3. Crestor 5 mg per day.  4. Lisinopril 40 mg daily.  5. Nuvigil 150 mg p.r.n.  6. Atenolol 25 mg per day.  7. Aspirin 81 mg per day.  8. Travatan Z 0.004% 1 drop each eye daily.  9. Omeprazole 20 mg per day.  10.Iron 150 mg daily.  11.Alprazolam 0.25 mg p.r.n.  12.Fish oil tablet daily.  13.Multivitamin  daily.  14.Cocuten 100 daily.  15.Niacin 500 mg per day.  16.Vitamin D 1000 units daily.   ALLERGIES:  SHELLFISH and ALPHAGAN.   SOCIAL HISTORY:  The patient is married.  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  He  denies any history of tobacco or alcohol use.  He has no children.   FAMILY HISTORY:  Father died at age 33 from cirrhosis and chronic  alcoholism.  Mother died at age 64 with diabetes and rectal cancer.  He  has a sister who has had hypertension and diabetes.   REVIEW OF SYSTEMS:  He has major complains of fatigue and dyspnea.  He  denies any change in bowel habits recently.  He denies any chest pain.  He has had no edema, orthopnea, or PND.  Has no history of stroke or  TIA.  His prior reaction to shellfish allergy was the fact that his  throat closed up and he could not breathe.  Denies any claudication  symptoms.  All other systems were reviewed and are negative.   PHYSICAL EXAMINATION:  GENERAL:  The patient is an obese white male in  no apparent distress.  VITAL SIGNS:  Weight is 201.4, blood pressure  is 152/78, pulse is 60 and  regular, respirations were normal.  HEENT:  He is normocephalic, atraumatic.  Pupils equal, round, and  reactive to light and accommodation.  Extraocular movements are full.  Oropharynx is clear.  NECK:  Supple without JVD, adenopathy, thyromegaly, or bruits.  LUNGS:  Clear to auscultation and percussion.  CARDIAC:  Regular rate and rhythm with harsh grade 3/6 systolic murmur  heard best at the right upper sternal border radiating to the left  sternal border.  ABDOMEN:  Soft, nontender without splenomegaly, mass, or bruits.  Femoral and pedal pulses are 2+ and symmetric without cyanosis or edema.  NEUROLOGIC:  He is alert.  His cranial nerves II through XII are intact.  He has no focal findings.   LABORATORY DATA:  Prior ECG demonstrated sinus bradycardia, otherwise  normal study.  Chest x-ray showed cardiomegaly with LVH, otherwise no  active disease.  Blood work is pending today.   IMPRESSION:  1. Symptoms of dyspnea and fatigue.  The patient has moderate-to-      severe aortic stenosis.  He also has an abnormal Cardiolite study      suggesting apical ischemia.  2. History of gastrointestinal bleed due to peptic ulcer disease,      resolved.  3. Obstructive sleep apnea.  4. Obesity.  5. History of necrotizing fasciitis.  6. Hypertension.  7. Hypercholesterolemia.  8. History of allergy to SHELLFISH.   PLAN:  The patient will undergo diagnostic right and left heart  catheterization with coronary angiography.  He will be pretreated for  potential DYE allergy with prednisone.  We will need to hold his  metformin 48 hours after his cath procedure.  Other potential  therapeutic options will be reviewed after his diagnostic study.           ______________________________  Peter M. Swaziland, M.D.     PMJ/MEDQ  D:  09/25/2008  T:  09/26/2008  Job:  [id number]   cc:   Barry Dienes. Eloise Harman, M.D.  Vania Rea. Jarold Motto, MD, Caleen Essex, FAGA  Cassell Clement,  M.D.
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
## 2010-11-23 NOTE — Consult Note (Signed)
Nathan [name], GEHRIG NO.:  [id number]   MEDICAL RECORD NO.:  [id number]           PATIENT TYPE:   LOCATION:                                 FACILITY:   PHYSICIAN:  Sheliah Plane, MD    DATE OF BIRTH:  1946/03/14   DATE OF CONSULTATION:  DATE OF DISCHARGE:                                 CONSULTATION   REFERRING CARDIOLOGIST:  Peter M. Swaziland, MD   FOLLOWUP CARDIOLOGIST:  Cassell Clement, MD   PRIMARY CARE PHYSICIAN:  Barry Dienes. Eloise Harman, MD   REASON FOR CONSULTATION:  Three-vessel coronary artery disease and  aortic stenosis.   HISTORY OF PRESENT ILLNESS:  The patient is a 65 year old male who noted  more than 6 months ago increasing shortness of breath with exertion.  His primary medical doctor, Dr. Eloise Harman noted a heart murmur and he was  seen and evaluated by Dr. Patty Sermons with an echo that showed peak  velocity across the aortic valve of greater than 4 cm/sec consistent  with severe aortic stenosis and calculated valve area of 0.9.  Cardiolite stress test was positive for ischemia.  The patient had been  scheduled to undergo cardiac catheterization in December 2010; however,  before this was done, he presented with nausea, vomiting, and dark-tarry  stools and was diagnosed with severe anemia and GI bleed, taken care of  by Dr. Sheryn Bison.  He was demonstrated to have peptic ulcer  disease.  He was transfused according to the patient 5 units of blood  and ultimately improved and was discharged home.  Since then, he had  followup with upper GI endoscopy, which showed healing of peptic ulcer  and colonoscopy that showed severe diverticulosis.  The patient was then  referred back and was evaluated by Cardiology for cardiac  catheterization.  He denies chest pain.  Does note continued fatigue.  Denies syncope or presyncope.  Other than when his hemoglobin was very  low.  He has had no previous history of myocardial infarction.  He has a  history of  hypertension, history of hyperlipidemia, and type 2 diabetes  since 1980.  He does not remember when, but thinks his last hemoglobin  A1c was 5.6.  He has had no previous stroke.  Denies claudication.  Denies renal insufficiency.   PAST MEDICAL HISTORY:  1. Significant for continued anemia, question of blood loss.  Most      recent hematocrit is 32%.  2. History of gastrointestinal bleeding.  3. Obstructive sleep apnea.  4. Obesity.  5. History of necrotizing fasciitis requiring multiple debridements      and reconstruction.  6. History of hypertension.  7. History of hypercholesterolemia.  8. Diabetes.  9. History of allergy to [facility].  10.He has also had laser treatment to his retina for diabetic      retinopathy.   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  Denies alcohol use.   CURRENT MEDICATIONS:  1. Avandamet 10/998 mg twice a day.  2. Glyburide 10  mg a day.  3. Crestor 5 mg a day.  4. Lisinopril 40 mg a day.  5. Nuvigil 150 mg p.r.n.  6. Atenolol 25 mg a day.  7. Aspirin 81 mg a day.  8. Travatan Z eye drops to each eye daily.  9. Omeprazole 20 mg a day.  10.Iron 150 mg a day.  11.Ativan 0.25 mg p.r.n.  12.Fish oil.  13.Multivitamin.  14.CoQ10 100 mg a day.  15.Niacin 500 a day.  16.__________ 1 tablespoon.  17.Vitamin D 1000 units.   ALLERGIES:  SHELLFISH causes his throat to swell.  He does eat shrimp  without difficulty, but does not eat crabmeat or lobster.   REVIEW OF SYMPTOMS:  Cardiac review of systems is positive for chest  pain and exertional shortness of breath, syncope, when he had GI bleed,  otherwise he has not.  Does have lower extremity edema in the past.  He  has had palpitations, but none since being started on the beta-blocker.  Denies presyncope.  General review of systems:  Denies fever, chills, or  night sweats.  He does get regular dental care.  Last time  he went to  the dentist was 3-4 months ago without any significant problem.  Does  have chronic left shoulder pain attributed to rotator cuff.  Notes that  this was why he was taking large quantities of nonsteroidal anti-  inflammatory which may have been the cause of his GI bleeding.  He has  history of frequent urination.  Denies any blood in his urine.  Did note  that he recently had a cystoscopy which showed no abnormalities by Dr.  Isabel Caprice.   PHYSICAL EXAMINATION:  Mildly obese male.  Blood pressure 169/73, blood  pressure 59, respiratory rate 18, and O2 sats 97%.  The patient is 5  feet 7 inches tall and 202 pounds.  He has radiating bruits over both  carotids, harsh high-pitched holosystolic murmur consistent with aortic  stenosis, heard along the left sternal border, radiating to the back.  Abdominal exam reveals moderately obese abdomen without palpable masses  or tenderness.  He has bruising along the anterior thigh from his recent  cardiac catheterization, appears to have adequate vein in both legs on  bypass.  He has 2+ DP and PT pulses bilaterally.   Cardiac catheterization films are reviewed from cardiac catheterization  done on September 30, 2008.  The patient has PA pressures of 34/10 and  diastolic pressure of 26.  Mean aortic gradient of 27.  Calculated valve  area of 1.1.  There is evidence of significant three-vessel coronary  artery disease.  His LAD is diffusely and heavily calcified with an 80-  90% stenosis in the proximal and mid vessel.  The circumflex has 2 small  marginal branches with 80% stenosis.  The right coronary artery has 60-  70% stenosis.   Echocardiogram reveals velocity across the aortic valve of greater than  4 cm/sec consistent with severe critical aortic stenosis.   IMPRESSION:  Moderately obese male with sleep apnea, blood loss anemia  from peptic ulcer disease with recent acute gastrointestinal bleed, who  presents with three-vessel coronary  artery disease and aortic stenosis  with the patient's peptic ulcer disease quiescent and treated now and  with significant coronary artery disease.  I agree with recommendation  of Dr. Swaziland to proceed with coronary artery bypass grafting and aortic  valve replacement.  With the patient's age and recent GI bleed and  concomitant coronary artery disease,  tissue valve has been discussed  with the patient who agrees.  The options of mechanical valve were  discussed with the patient, but it was felt that the long-term Coumadin  therapy may be dangerous for him.  The risks of surgery including death,  infection, stroke, myocardial infarction, bleeding, and blood  transfusion are all discussed with the patient and his wife in detail.  We will obtain carotid Doppler studies prior to surgery and proceed with  coronary artery bypass grafting and aortic valve replacement on Monday,  October 20, 2008.  The patient is willing to proceed.      Sheliah Plane, MD  Electronically Signed     EG/MEDQ  D:  10/09/2008  T:  10/10/2008  Job:  [id number]   cc:   Cassell Clement, M.D.  Barry Dienes Eloise Harman, M.D.

## 2010-11-23 NOTE — H&P (Signed)
NAMETULLIO, CHAUSSE NO.:  [id number]   MEDICAL RECORD NO.:  [id number]          PATIENT TYPE:  INP   LOCATION:  5504                         FACILITY:  [facility]   PHYSICIAN:  Barry Dienes. Eloise Harman, M.D.DATE OF BIRTH:  [birth year]   DATE OF ADMISSION:  07/07/2008  DATE OF DISCHARGE:                              HISTORY & PHYSICAL   CHIEF COMPLAINT:  Fatigue and diarrhea.   HISTORY OF PRESENT ILLNESS:  The patient is a 65 year old Caucasian man  with several medical problems who is well-known to me.  For the past 2  weeks, he has had intermittent nausea associated with dark tarry bowel  movements and occasional dry heaves.  He has been taking some ibuprofen  over the past week.  He also complains of progressive dyspnea on  exertion, one episode of syncope while at the bathroom, and intermittent  epigastric and substernal chest discomfort (which has resolved).  He is  being followed closely by his cardiologist who had planned a cardiac  catheterization to assess an abnormal Cardiolite exercise test in the  phase of severe aortic valve stenosis.  He has no history of peptic  ulcer disease and had a routine colonoscopy in approximately 2003, which  showed mild diverticulosis.  In February 2009, he had a CBC with  hemoglobin 11.4 and hematocrit 35.3 with MCV 82.5 that led to workup  laboratory tests including serum iron 67, total iron binding capacity  297 (22.6% saturation) and serum protein electrophoresis normal with  serum ferritin 39.  His medical history is also significant for diabetes  mellitus, type 2 with most recent hemoglobin A1c level 8.1% in November  2009 for which he has seen our certified diabetes educator, aortic  stenosis with aortic valve area of 0.9 cm2, hypertension, obstructive  sleep apnea for which he did not tolerate CPAP treatment, and  necrotizing fasciitis in 1995 that led to extensive perineum surgery,  dyslipidemia, and glaucoma.   MEDICATIONS PRIOR TO ADMISSION:  1. Crestor 5 mg p.o. daily.  2. Lisinopril 40 mg p.o. daily.  3. Avandamet 10/998 twice daily.  4. Glipizide 10 mg once daily.  5. Nuvigil 150 mg p.o. daily.  6. Atenolol 25 mg daily.  7. Aspirin 81 mg daily.  8. Multivitamin 1 tablet daily.  9. Omega-3 fatty acid once daily.  10.Coenzyme Q10 100 mg daily.  11.Niacin extended release 500 mg daily.   ALLERGIES:  SHELLFISH and ALPHAGAN.   PAST SURGICAL HISTORY:  1970 left inguinal hernia repair, 1995  necrotizing fasciitis with extensive surgery to the scrotum and  perineum, 2001 and 2006 laser treatments to both eyes for glaucoma and  diabetic retinopathy.   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  He has no  history of tobacco or alcohol abuse.  He has no children.   FAMILY HISTORY:  His father died at age 51 and he also had a history of  cirrhosis from chronic alcoholism.  His mother died at age 29 from  diabetes mellitus with rectal carcinoma.  He has a sister who has  diabetes and hypertension.  There are no close relatives that have had  premature coronary artery disease, colon cancer, or breast cancer.   REVIEW OF SYSTEMS:  He has had significant fatigue and dyspnea with  conversation lately.  He has had some epigastric and substernal chest  discomfort which is currently not present.  He denies dysuria or  frequency.  He has moderate anxiety regarding his upcoming cardiac  treatments.  He denies depression.   INITIAL PHYSICAL EXAMINATION:  VITAL SIGNS:  Current blood pressure is  122/56, pulse 73, respirations 20, temp 97.4.  GENERAL:  He is an overweight white male who is somewhat anxious and had  mild dyspnea with conversation.  HEAD, EYES, EARS, NOSE and THROAT:  Significant for pale conjunctiva  bilaterally.  NECK:  Supple and without jugular venous distention or carotid bruit.  CHEST:  Clear to auscultation.  HEART:  Regular rate and rhythm.  S1 and S2 were present  with a systolic  ejection murmur of grade 3/6 at the left sternal border.  ABDOMEN:  Normal bowel sounds and no hepatosplenomegaly or tenderness.  RECTAL:  By the emergency room physician showed melena.  EXTREMITIES:  Without cyanosis, clubbing, or edema and the pedal pulses  were normal.  NEUROLOGICAL:  He was alert and mildly anxious.  He had mild difficulty  with short-term memory.  He had intact light touch sensation and was  able to move all extremities well.   INITIAL LABORATORY STUDIES:  White blood cell count 8.5, hemoglobin 7.2,  hematocrit 22.4, platelets 192, PT 13.4, PTT 27.  Serum sodium 136,  potassium 4.7, chloride 102, carbon dioxide 20, BUN 34, creatinine 1.72  with August 29, 2007, office laboratory test showing BUN 16,  creatinine 1.1.  Glucose tonight was 243 with total protein 5.9, albumin  3.6.   IMPRESSION AND PLAN:  1. Anemia:  This is moderately severe and most likely due to an upper      gastrointestinal bleed from peptic ulcer disease versus      esophagitis.  I doubt that he has a neoplasm.  H.  Pylorus      infection could play a role as could his recent nonsteroidal anti-      inflammatory drug treatment.  He is status post 2 units of packed      red blood cell transfusion earlier today.  We will recheck his CBC      and if his hemoglobin is less than 9, transfuse 2 more units of      packed red blood cells particularly in view of possible upcoming      cardiac catheterization and/or cardiac surgery.  We will continue      high-dose proton pump inhibitor with Protonix 40 mg IV twice daily.      We will check a serum H. Pylorus serology.  2. Diabetes mellitus, type 2:  Unstable on his current meds regimen.      We will add sliding scale insulin p.r.n.  3. Aortic stenosis:  Stable and severe aortic stenosis by      echocardiogram aortic valve area.  A cardiac catheterization is      pending soon.           ______________________________  Barry Dienes  Eloise Harman, M.D.     DGP/MEDQ  D:  07/07/2008  T:  07/08/2008  Job:  [id number]   cc:   Peter M. [place], M.D.

## 2010-11-23 NOTE — Assessment & Plan Note (Signed)
OFFICE VISIT   Nathan [name], Nathan [name]  DOB:  Oct 13, 1945                                        Nov 13, 2008  CHART #:  [id number]   HISTORY OF PRESENT ILLNESS:  The patient had undergone coronary artery  bypass grafting x3 and aortic valve replacement with a 23-mm tissue  valve on October 20, 2008.  The patient returns to the office today for a  followup, doing well.  Early postop, he had some episodes of  hallucinations and confusion, however these have completely resolved.  At this point, he is walking between 2 and 4 miles a day.  He notes that  his respiratory status is much improved compared to preoperatively.  His  mental status has returned to a normal.  He does have some slight  numbness in his right small finger.   PHYSICAL EXAMINATION:  On exam, his blood pressure 156/82, pulse 78,  respiratory rate is 18, O2 saturations 96%.  His sternum is stable and  well-healed bowel sounds are crisp.  I do not appreciate any murmur of  aortic insufficiency.  His right thigh endovein harvest site is healing  well without evidence of an infection.  He has good strength and motor  movement in both hands and describes slight decrease in sensation and  hypoesthesias in the right small finger, but in no other digits.   Followup chest x-ray shows very small left pleural effusion, otherwise  clear.   MEDICATIONS:  He continues on Avandamet 10/998 twice a day, glyburide XL  10 mg a day, metoprolol ER 50 mg a day, Crestor 5 mg a day, omeprazole  20 mg a day, iron, aspirin, niacin, vitamin D, tramadol; and previously  had been on lisinopril, however because of low blood pressure, he was  not discharged home on this, but it could be added back as necessary per  Dr. Patty Sermons.   I reviewed with the patient the need for dental prophylaxis with his  tissue valve.  He notes for financial reasons, he cannot start in  cardiac rehab, but he is walking between 2 and 4 miles a day  without  difficulty.  He is anxious to return to work.  I  recommended that he  wait 2 weeks and then return to work with no heavy lifting for 3 months  over 25 pounds.   I will plan to see him back p.r.n. or adhere to Dr. Yevonne Pax request.   Sheliah Plane, MD  Electronically Signed   EG/MEDQ  D:  11/13/2008  T:  11/14/2008  Job:  [id number]   cc:   Cassell Clement, M.D.

## 2010-11-23 NOTE — Discharge Summary (Signed)
NAMERAMEL, TOBON NO.:  [id number]   MEDICAL RECORD NO.:  [id number]          PATIENT TYPE:  INP   LOCATION:  5504                         FACILITY:  [facility]   PHYSICIAN:  Barry Dienes. Eloise Harman, M.D.DATE OF BIRTH:  1945/09/03   DATE OF ADMISSION:  07/07/2008  DATE OF DISCHARGE:  07/10/2008                               DISCHARGE SUMMARY   PERTINENT FINDINGS:  The patient is a 65 year old Caucasian man who is  well known to me.  For the past 2 weeks, he complained of intermittent  nausea associated with dark tarry bowel movements and occasional dry  heaves.  He had been taking some ibuprofen over the past week.  He also  complained of progressive dyspnea on exertion with 1 episode of syncope  while at the bathroom, intermittent epigastric and substernal chest pain  (which has resolved).  He was scheduled for a cardiac catheterization  tomorrow to assess an abnormal Cardiolite exercise test with known  severe aortic valve stenosis.  He has no history of peptic ulcer  disease.  A routine colonoscopy in 2003 showed mild diverticulosis.  In  February 2009, he had mild anemia with hematocrit 35 with lab tests not  clearly showing iron deficiency anemia.   His medical history is also significant for:  1. Diabetes mellitus, type 2 with most recent hemoglobin A1c level      8.1% in November 2009.  2. Aortic stenosis with aortic valve area 0.9 sq. cm.  3. Hypertension.  4. Obstructive sleep apnea for which he did not tolerate CPAP      treatment.  5. Necrotizing fasciitis in 1995 that led to extensive perineum      surgery.  6. Dyslipidemia.  7. Glaucoma.   See admission history and physical for details of medications prior to  admission, allergies, past surgical history, social history, family  history, and review of systems.   INITIAL PHYSICAL EXAMINATION:  VITAL SIGNS:  Blood pressure 122/56,  pulse 73, respirations 20, temperature 97.4.  GENERAL:  He is an  overweight white male who was somewhat anxious and  had mild dyspnea with conversation.  HEAD, EYES, EARS, NOSE AND THROAT:  Significant for pale conjunctivae  bilaterally.  NECK:  Supple without jugular venous distention or carotid bruit.  CHEST:  Clear to auscultation.  HEART:  Regular rate and rhythm.  S1 and S2 were present with a systolic  ejection murmur of grade 3/6 at the left sternal border.  ABDOMEN:  Normal bowel sounds and no hepatosplenomegaly or tenderness.  RECTAL:  By the emergency room physician showed melena.  EXTREMITIES:  Without cyanosis, clubbing, or edema and the pedal pulses  were normal.  NEUROLOGIC:  He was alert and mildly anxious.  He had mild difficulty  with short-term memory.  He had intact light to touch sensation in both  feet and was able to move all extremities well.   INITIAL LABORATORY STUDIES:  White blood cell count 8.5, hemoglobin 7.2,  hematocrit 22.4, platelets 192.  PT 13.4, PTT 27.  Serum sodium 136,  potassium 4.7, chloride 102, carbon dioxide 20, BUN 34,  creatinine 1.72  with February 2009 office laboratory test showing BUN 16, creatinine  1.1.  Glucose was 243 with total protein 5.9, albumin 3.6.   HOSPITAL COURSE:  The patient was admitted to a medical bed and started  on empiric high-dose proton pump inhibitor treatment.  He was seen by a  GI consultant who recommended endoscopy and colonoscopy if endoscopy was  normal.  On July 08, 2008, he had an EGD with biopsy done, which  showed an ulcer in the body of the stomach that had a clean base.  It  was 1 cm in size.  Multiple biopsies were obtained and sent to  pathology.  Moderate gastritis was found in the total stomach.  Otherwise, the examination was normal.  Retroflex views showed no  abnormalities.  Continue treatment with a proton pump inhibitor was  recommended with the avoidance of nonsteroidal anti-inflammatory drugs  and temporary cessation of aspirin treatment.  He was  treated with a  total of 4 units of packed red blood cells of transfusion.  His hospital  course was, otherwise, significant for several episodes of moderate  hypoglycemia while on a restricted diet.  He had a Helicobacter pylori  serology test that was less than 0.4.   PROCEDURES:  Endoscopy with biopsy and transfusion of 4 units of packed  red blood cells.   COMPLICATIONS:  None.   CONDITION ON DISCHARGE:  He no longer has dyspnea at rest or with  ambulation in the hallway.  His most recent vital signs are blood  pressure 141/68, pulse 56, respirations 22, temperature 98.8, capillary  blood glucose was 89 earlier in the a.m.  Most recent pulse oxygen level  was 98% on room air.  In general, he is a mildly overweight white male  who is in no apparent distress.  Chest was clear to auscultation.  Heart  had a regular rate and rhythm with a systolic ejection murmur of grade  3/6.  Abdomen had normal bowel sounds and no tenderness.  Extremities  were without edema.   MOST RECENT LABORATORY STUDIES:  Serum sodium 140, potassium 4.7,  chloride 113, carbon dioxide 22, BUN 9, creatinine 1.25, glucose 70.  Most recent CBC yesterday was white blood cell count 6.2, hemoglobin  9.4, hematocrit 27.9, platelets 155.  Note that a repeat CBC and BUN  were pending at the time of dictation.   DISCHARGE DIAGNOSES:  1. Iron deficiency anemia.  2. Gastric ulcer.  3. Gastritis.  4. Acute renal insufficiency, resolved.  5. Hypertension.  6. Dyslipidemia.  7. Diabetes mellitus, type 2, controlled.  8. Obstructive sleep apnea.  9. Anxiety.  10.Glaucoma.  11.Pharyngitis.   DISCHARGE MEDICATIONS:  1. He was advised to restart Ecotrin 81 mg daily on July 22, 2008.  2. He was advised to avoid nonsteroidal anti-inflammatory drugs.  3. Tylenol 325 mg p.o. daily p.r.n. pain.  4. Omeprazole 20 mg p.o. twice daily for 30 days then once daily.  5. Crestor 5 mg daily.  6. Lisinopril 40 mg daily.  7.  Avandamet 10/998 one tablet twice daily.  8. He was advised to restart glipizide 10 mg daily when he is back to      his normal eating habits and to monitor his capillary blood glucose      levels 3 times daily.  9. Nuvigil 150 mg p.o. daily.  10.Atenolol 25 mg p.o. daily.  11.Multivitamin once daily.  12.Omega 3 fatty acid once daily.  13.Coenzyme Q10 100 mg p.o.  daily.  14.Niaspan 500 mg p.o. daily.  15.Xanax 0.25 mg p.o. t.i.d., #90 and refill 1.  16.Nu-Iron 150 one tablet p.o. twice daily, #60 and refill 1.  17.Travatan 0.004% 1 drop each eye nightly,  18.Cepacol throat lozenges as needed.   DISPOSITION AND FOLLOWUP:  He will be discharged to home today.  He was  advised to continue a diet limited in concentrated sweets.  He was  advised to limit activity to anything that does not cause difficulty of  breathing, for example, he can walk at a pace that still allows him to  have a conversation.  He was advised to have a followup appoint with Dr.  Jarome Matin in approximately 2 weeks following discharge.  He was  advised to schedule a followup GI evaluation with Dr. Sheryn Bison by  calling [address] [facility], [phone].  He was advised to call in  August 12, 2008, range for a March 2010 appointment.  He was advised to  call us as soon as possible should his dyspnea recur or his bowel  movements changed to dark in color.           ______________________________  Barry Dienes Eloise Harman, M.D.     DGP/MEDQ  D:  07/10/2008  T:  07/10/2008  Job:  [id number]   cc:   Peter M. [place], M.D.

## 2010-11-23 NOTE — H&P (Signed)
Nathan [name], Nathan [name] NO.:  [id number]   MEDICAL RECORD NO.:  [id number]          PATIENT TYPE:  OUT   LOCATION:                               FACILITY:  [facility]   PHYSICIAN:  Peter M. [place], M.D.  DATE OF BIRTH:  1946-01-09   DATE OF ADMISSION:  DATE OF DISCHARGE:                              HISTORY & PHYSICAL   HISTORY OF PRESENT ILLNESS:  Nathan [name] is a 65 year old white male with  a history of a heart murmur.  He has moderate to severe aortic stenosis  with an echocardiogram in August 2009 showed a valve area of 0.9 cm2, a  peak instantaneous gradient of 70 mmHg, and a mean gradient of 43 mmHg.  There is no prior study for comparison.  Left ventricular function was  normal at that time.  The patient did have a negative stress Cardiolite  study in March 2006.  The patient presented this time with symptoms of  progressive dyspnea on exertion.  He notices particularly with walking,  especially up hill.  If he is walking up hill, he has to stop and catch  his breath.  Two years ago, he had no difficulty going up and down  stairs but now gets short of breath.  He denies any significant change  in weight.  He has had no edema.  He denies any PND.  He recently  underwent a repeat stress Cardiolite study by Dr. Patty Sermons.  The  patient was able to exercise for a total of 6 minutes and 41 seconds.  He developed right-sided chest pain and dyspnea on exertion.  ECG showed  new inferolateral ST-segment depression, and Cardiolite images  demonstrated evidence of apical ischemia.  His ejection fraction was  55%.  Based on these findings and his aortic stenosis, it was  recommended he undergo cardiac catheterization to guide further therapy.   PAST MEDICAL HISTORY:  The patient does have a history of obstructive  sleep apnea.  He states he was unable to tolerate a trial of CPAP  therapy.  Additional medical history includes a history of chronic  nightmares.  He had a  history of necrotizing fasciitis several years ago  and required 7 surgeries for this with extensive reconstruction in his  perineum.   He has no known allergies.   CURRENT MEDICATIONS:  1. Crestor 5 mg per day.  2. Lisinopril 40 mg per day.  3. Avandamet 10/998 mg twice a day.  4. Glipizide 10 mg per day.  5. Nuvigil 150 mg per day.  6. Atenolol 25 mg per day.  7. Aspirin 81 mg per day.  8. Multivitamin daily.  9. Omega-3 daily.  10.Co-Q10 100 mg daily.  11.Niacin 500 mg per day.   SOCIAL HISTORY:  The patient runs a gun shop.  He previously worked as a  Insurance account manager.  He denies tobacco use.  He does drink occasional  beer.  He is married.   FAMILY HISTORY:  Father died at age 67 due to a murder.  Mother died at  age 57 with diabetes and gastrointestinal cancer.  He  has a sister who  has hypertension and diabetes.   REVIEW OF SYSTEMS:  He has no history of hematochezia, melena, change in  bowel habit, or dysuria.  He has occasional nocturia.  He has frequent  nightmares.  He does have adult-onset diabetes mellitus without  complications.  All other systems are reviewed, are negative.   PHYSICAL EXAMINATION:  GENERAL:  The patient is an overweight, well-  developed white male, in no distress.  VITAL SIGNS:  Weight is 208, blood pressure 146/78, pulse is 68 and  regular.  SKIN:  Clear.  HEENT:  Pupils equal, round, and reactive.  Extraocular movements are  full.  Sclerae are clear.  Oropharynx is clear.  Tongue is midline.  NECK:  Supple without JVD, adenopathy, thyromegaly, or bruits.  LUNGS:  Clear to auscultation and percussion.  CARDIAC:  A harsh grade 2/6 systolic murmur in the aortic area,  radiating to the neck, into the left sternal border.  There is no  diastolic murmur or a gallop.  ABDOMEN:  Soft, nontender without hepatosplenomegaly, mass, or bruits.  EXTREMITIES:  Without edema or phlebitis.  He has good pedal pulses.  NEUROLOGIC:  He is alert and  oriented x4.  His mood is slightly  depressed.  Cranial nerves II-XII are intact.   ECG shows normal sinus rhythm with a normal ECG.  Chest x-ray shows  cardiomegaly with LVH.   IMPRESSION:  1. Recent increasing symptoms of dyspnea on exertion.  The patient has      evidence of coronary disease with an abnormal stress Cardiolite      study showing evidence of apical ischemia.  He also has moderate-to-      severe aortic stenosis.  2. Aortic stenosis.  3. Diabetes mellitus, type 2.  4. Hypercholesterolemia.  5. Hypertension.  6. History of obstructive sleep apnea.   PLAN:  He will undergo diagnostic right and left heart catheterizations  and coronary angiography with further therapy pending these results.           ______________________________  Peter M. [place], M.D.     PMJ/MEDQ  D:  06/27/2008  T:  06/28/2008  Job:  [id number]   cc:   Barry Dienes. Eloise Harman, M.D.  Cassell Clement, M.D.

## 2010-12-15 ENCOUNTER — Ambulatory Visit (INDEPENDENT_AMBULATORY_CARE_PROVIDER_SITE_OTHER): Payer: Medicare Other | Admitting: Gastroenterology

## 2010-12-15 DIAGNOSIS — E538 Deficiency of other specified B group vitamins: Secondary | ICD-10-CM

## 2010-12-15 NOTE — Progress Notes (Signed)
b12 given by Tedra Senegal

## 2010-12-17 ENCOUNTER — Other Ambulatory Visit: Payer: Self-pay | Admitting: Gastroenterology

## 2011-01-06 ENCOUNTER — Other Ambulatory Visit (INDEPENDENT_AMBULATORY_CARE_PROVIDER_SITE_OTHER): Payer: Medicare Other

## 2011-01-06 ENCOUNTER — Encounter: Payer: Self-pay | Admitting: Gastroenterology

## 2011-01-06 ENCOUNTER — Ambulatory Visit (INDEPENDENT_AMBULATORY_CARE_PROVIDER_SITE_OTHER): Payer: Medicare Other | Admitting: Gastroenterology

## 2011-01-06 VITALS — BP 118/58 | HR 68 | Ht 67.0 in | Wt 203.0 lb

## 2011-01-06 DIAGNOSIS — Z860100 Personal history of colon polyps, unspecified: Secondary | ICD-10-CM | POA: Insufficient documentation

## 2011-01-06 DIAGNOSIS — K2950 Unspecified chronic gastritis without bleeding: Secondary | ICD-10-CM

## 2011-01-06 DIAGNOSIS — R6889 Other general symptoms and signs: Secondary | ICD-10-CM

## 2011-01-06 DIAGNOSIS — D509 Iron deficiency anemia, unspecified: Secondary | ICD-10-CM | POA: Insufficient documentation

## 2011-01-06 DIAGNOSIS — K5730 Diverticulosis of large intestine without perforation or abscess without bleeding: Secondary | ICD-10-CM

## 2011-01-06 DIAGNOSIS — K2940 Chronic atrophic gastritis without bleeding: Secondary | ICD-10-CM

## 2011-01-06 LAB — CBC WITH DIFFERENTIAL/PLATELET
Basophils Relative: 0.9 % (ref 0.0–3.0)
Eosinophils Relative: 4.4 % (ref 0.0–5.0)
HCT: 36.7 % — ABNORMAL LOW (ref 39.0–52.0)
Hemoglobin: 12.3 g/dL — ABNORMAL LOW (ref 13.0–17.0)
Lymphs Abs: 1.3 10*3/uL (ref 0.7–4.0)
MCV: 84.2 fl (ref 78.0–100.0)
Monocytes Absolute: 0.7 10*3/uL (ref 0.1–1.0)
Monocytes Relative: 11.6 % (ref 3.0–12.0)
Platelets: 143 10*3/uL — ABNORMAL LOW (ref 150.0–400.0)
RBC: 4.36 Mil/uL (ref 4.22–5.81)
WBC: 5.9 10*3/uL (ref 4.5–10.5)

## 2011-01-06 LAB — IBC PANEL
Saturation Ratios: 27.3 % (ref 20.0–50.0)
Transferrin: 235.8 mg/dL (ref 212.0–360.0)

## 2011-01-06 LAB — VITAMIN B12: Vitamin B-12: 341 pg/mL (ref 211–911)

## 2011-01-06 LAB — FERRITIN: Ferritin: 20.8 ng/mL — ABNORMAL LOW (ref 22.0–322.0)

## 2011-01-06 MED ORDER — OMEPRAZOLE 40 MG PO CPDR: DELAYED_RELEASE_CAPSULE | ORAL | Status: DC

## 2011-01-06 NOTE — Progress Notes (Signed)
This is a complex 65 year old Caucasian male insulin-dependent diabetic with obstructive sleep apnea and hypertensive cardiovascular disease on multiple medications. He had iron deficiency anemia, and his oral iron replacement therapy. Colonoscopy showed a cecal polyp which was removed without difficulty, and followup exam has been scheduled in 3-5 years. Endoscopy showed diffuse inflammatory gastritis, biopsies were negative for each port infection. He currently is asymptomatic on Prilosec 40 mg a day. He specifically denies melena, hematochezia, dyspepsia, indigestion, abdominal pain, hepatobiliary complaints, anorexia or weight loss.  Current Medications, Allergies, Past Medical History, Past Surgical History, Family History and Social History were reviewed in [employer] record.  Pertinent Review of Systems Negative,, no current pulmonary or cardiovascular issues, all multiple medications. His diabetes also apparently is under good control, and he is followed by Dr. Jarome Matin in internal medicine. gative  Physical Exam: Obese patient appeared his stated age in no acute distress. I cannot appreciate stigmata of chronic liver disease. His abdomen shows no organomegaly, masses or tenderness. Mental status is normal.    Assessment and Plan: NSAID gastropathy doing better on regular PPD daily treatment. I will repeat his CBC and anemia profile,IFOB [place], and renewed his Prilosec 40 mg a day. Patient is on monthly B12 replacement therapy also. No diagnosis found.   Please copy his primary care physician, referring physician, and pertinent subspecialists.

## 2011-01-06 NOTE — Patient Instructions (Signed)
Please go to the basement today for your labs.  Your prescription(s) have been sent to you pharmacy.   

## 2011-01-13 ENCOUNTER — Ambulatory Visit (INDEPENDENT_AMBULATORY_CARE_PROVIDER_SITE_OTHER): Payer: Medicare Other | Admitting: Gastroenterology

## 2011-01-13 DIAGNOSIS — E538 Deficiency of other specified B group vitamins: Secondary | ICD-10-CM

## 2011-01-21 ENCOUNTER — Other Ambulatory Visit: Payer: Medicare Other

## 2011-01-25 ENCOUNTER — Other Ambulatory Visit: Payer: Self-pay | Admitting: Gastroenterology

## 2011-01-25 DIAGNOSIS — Z1211 Encounter for screening for malignant neoplasm of colon: Secondary | ICD-10-CM

## 2011-02-16 ENCOUNTER — Ambulatory Visit (INDEPENDENT_AMBULATORY_CARE_PROVIDER_SITE_OTHER): Payer: Medicare Other | Admitting: Gastroenterology

## 2011-02-16 DIAGNOSIS — E538 Deficiency of other specified B group vitamins: Secondary | ICD-10-CM

## 2011-03-23 ENCOUNTER — Ambulatory Visit (INDEPENDENT_AMBULATORY_CARE_PROVIDER_SITE_OTHER): Payer: Medicare Other | Admitting: Gastroenterology

## 2011-03-23 DIAGNOSIS — E538 Deficiency of other specified B group vitamins: Secondary | ICD-10-CM

## 2011-04-15 LAB — CROSSMATCH: Antibody Screen: NEGATIVE

## 2011-04-15 LAB — BASIC METABOLIC PANEL
BUN: 20 mg/dL (ref 6–23)
BUN: 9 mg/dL (ref 6–23)
CO2: 22 mEq/L (ref 19–32)
Calcium: 8.4 mg/dL (ref 8.4–10.5)
Calcium: 8.5 mg/dL (ref 8.4–10.5)
Chloride: 113 mEq/L — ABNORMAL HIGH (ref 96–112)
GFR calc Af Amer: >60 mL/min (ref >60)
GFR calc non Af Amer: 52 mL/min — ABNORMAL LOW (ref >60)
GFR calc non Af Amer: >60 mL/min (ref >60)
Glucose, Bld: 109 mg/dL — ABNORMAL HIGH (ref 70–99)
Glucose, Bld: 70 mg/dL (ref 70–99)
Glucose, Bld: 71 mg/dL (ref 70–99)
Potassium: 4.7 mEq/L (ref 3.5–5.1)
Sodium: 137 mEq/L (ref 135–145)
Sodium: 141 mEq/L (ref 135–145)

## 2011-04-15 LAB — COMPREHENSIVE METABOLIC PANEL
AST: 38 U/L — ABNORMAL HIGH (ref 0–37)
Albumin: 3.6 g/dL (ref 3.5–5.2)
BUN: 34 mg/dL — ABNORMAL HIGH (ref 6–23)
Creatinine, Ser: 1.72 mg/dL — ABNORMAL HIGH (ref 0.4–1.5)
GFR calc Af Amer: 49 mL/min — ABNORMAL LOW (ref >60)
Potassium: 4.7 mEq/L (ref 3.5–5.1)
Total Protein: 5.9 g/dL — ABNORMAL LOW (ref 6.0–8.3)

## 2011-04-15 LAB — CBC
HCT: 22.4 % — ABNORMAL LOW (ref 39.0–52.0)
HCT: 27.9 % — ABNORMAL LOW (ref 39.0–52.0)
Hemoglobin: 10.1 g/dL — ABNORMAL LOW (ref 13.0–17.0)
Hemoglobin: 7.7 g/dL — CL (ref 13.0–17.0)
MCV: 86.6 fL (ref 78.0–100.0)
MCV: 86.8 fL (ref 78.0–100.0)
Platelets: 155 10*3/uL (ref 150–400)
Platelets: 172 10*3/uL (ref 150–400)
Platelets: 192 10*3/uL (ref 150–400)
RDW: 15.3 % (ref 11.5–15.5)
RDW: 16 % — ABNORMAL HIGH (ref 11.5–15.5)
RDW: 16.3 % — ABNORMAL HIGH (ref 11.5–15.5)
RDW: 16.5 % — ABNORMAL HIGH (ref 11.5–15.5)

## 2011-04-15 LAB — GLUCOSE, CAPILLARY
Glucose-Capillary: 116 mg/dL — ABNORMAL HIGH (ref 70–99)
Glucose-Capillary: 144 mg/dL — ABNORMAL HIGH (ref 70–99)
Glucose-Capillary: 186 mg/dL — ABNORMAL HIGH (ref 70–99)
Glucose-Capillary: 67 mg/dL — ABNORMAL LOW (ref 70–99)
Glucose-Capillary: 88 mg/dL (ref 70–99)

## 2011-04-15 LAB — OCCULT BLOOD X 1 CARD TO LAB, STOOL: Fecal Occult Bld: POSITIVE

## 2011-04-15 LAB — DIFFERENTIAL
Lymphocytes Relative: 17 % (ref 12–46)
Monocytes Absolute: 0.7 10*3/uL (ref 0.1–1.0)
Monocytes Relative: 8 % (ref 3–12)
Neutro Abs: 6.1 10*3/uL (ref 1.7–7.7)

## 2011-04-15 LAB — PREPARE RBC (CROSSMATCH)

## 2011-04-15 LAB — ABO/RH: ABO/RH(D): A POS

## 2011-04-15 LAB — APTT: aPTT: 27 seconds (ref 24–37)

## 2011-04-20 ENCOUNTER — Ambulatory Visit (INDEPENDENT_AMBULATORY_CARE_PROVIDER_SITE_OTHER): Payer: Medicare Other | Admitting: Cardiology

## 2011-04-20 ENCOUNTER — Encounter: Payer: Self-pay | Admitting: Cardiology

## 2011-04-20 VITALS — BP 144/72 | HR 75 | Ht 67.0 in | Wt 197.0 lb

## 2011-04-20 DIAGNOSIS — Z9889 Other specified postprocedural states: Secondary | ICD-10-CM

## 2011-04-20 DIAGNOSIS — Z952 Presence of prosthetic heart valve: Secondary | ICD-10-CM

## 2011-04-20 DIAGNOSIS — Z951 Presence of aortocoronary bypass graft: Secondary | ICD-10-CM

## 2011-04-20 DIAGNOSIS — E119 Type 2 diabetes mellitus without complications: Secondary | ICD-10-CM

## 2011-04-20 DIAGNOSIS — I359 Nonrheumatic aortic valve disorder, unspecified: Secondary | ICD-10-CM

## 2011-04-20 DIAGNOSIS — I119 Hypertensive heart disease without heart failure: Secondary | ICD-10-CM

## 2011-04-20 DIAGNOSIS — E7800 Pure hypercholesterolemia, unspecified: Secondary | ICD-10-CM

## 2011-04-20 NOTE — Assessment & Plan Note (Signed)
The patient has not been causing any symptoms of orthopnea, or paroxysmal nocturnal dyspnea.   He has not been walking much recently because of a recent right foot injury.

## 2011-04-20 NOTE — Assessment & Plan Note (Signed)
The patient has not been experiencing any chest pain or shortness of breath. 

## 2011-04-20 NOTE — Assessment & Plan Note (Signed)
The patient has not been expressing any difficulty from his blood pressure medication.  He denies any dizziness or syncope.  He has not had any symptoms of congestive heart failure.

## 2011-04-20 NOTE — Patient Instructions (Signed)
continue same dose of medications  Work on careful diet  Your physician wants you to follow-up in: 4 months You will receive a reminder letter in the mail two months in advance. If you don't receive a letter, please call our office to schedule the follow-up appointment.

## 2011-04-20 NOTE — Progress Notes (Signed)
Nathan [name] Date of Birth:  1946/01/28 [facility] [address]   [address] [place], [place]  [address] [phone]           Fax   [phone]  History of Present Illness: This pleasant 65 year old gentleman is seen for a scheduled time on followup office visit is history of previous coronary artery bypass graft surgery and aortic valve replacement on April 2010.  Has a tissue valve in place.  Had a stress test on 10/08/09 showing normal ejection fraction 56% and no ischemia.  His last echocardiogram on 12/02/09 showed normally functioning prosthetic aortic valve and normal pulmonary artery pressure and normal.  Left jugular systolic function with grade 1 diastolic dysfunction.  In addition to his ischemic heart disease and valvular heart disease.  The patient has a history of essential hypertension and diabetes and hypercholesterolemia.  He also receives monthly B12 shots for vitamin B 12 deficiency.  Current Outpatient Prescriptions  Medication Sig Dispense Refill  . ALPRAZolam (XANAX) 0.25 MG tablet Take 0.25 mg by mouth as needed.        Marland Kitchen amLODipine (NORVASC) 5 MG tablet Take 5 mg by mouth daily.        Marland Kitchen aspirin 325 MG tablet Take 325 mg by mouth daily.        . Cholecalciferol (VITAMIN D3) 1000 UNITS capsule Take 1,000 Units by mouth daily.        . Cyanocobalamin (VITAMIN B-12 IJ) Inject as directed every 30 (thirty) days.        . fish oil-omega-3 fatty acids 1000 MG capsule Take 1 g by mouth daily.        Marland Kitchen glipiZIDE (GLUCOTROL) 5 MG tablet Take 5 mg by mouth daily.        . hydrochlorothiazide (,MICROZIDE/HYDRODIURIL,) 12.5 MG capsule Take 1 capsule (12.5 mg total) by mouth daily.  30 capsule  11  . hydrocodone-acetaminophen (LORCET-HD) 5-500 MG per capsule Take 1 capsule by mouth as needed.        . insulin glargine (LANTUS SOLOSTAR) 100 UNIT/ML injection Inject 10 Units into the skin every morning.       . latanoprost (XALATAN) 0.005 % ophthalmic  solution Place 1 drop into both eyes at bedtime.        Marland Kitchen loratadine-pseudoephedrine (CLARITIN-D 12-HOUR) 5-120 MG per tablet Take 1 tablet by mouth as needed.        . metFORMIN (GLUCOPHAGE) 1000 MG tablet Take 1,000 mg by mouth 2 (two) times daily with a meal.        . metoprolol (LOPRESSOR) 50 MG tablet Take by mouth 2 (two) times daily.        . minoxidil (LONITEN) 2.5 MG tablet Take 2.5 mg by mouth daily.        . Multiple Vitamin (MULTIVITAMIN) tablet Take 1 tablet by mouth daily.        Marland Kitchen olmesartan (BENICAR) 40 MG tablet Take 40 mg by mouth daily.        Marland Kitchen omeprazole (PRILOSEC) 40 MG capsule Take one tablet by mouth twice a day  60 capsule  11  . POLYSACCHARIDE IRON (IRON POLYSACCHARIDES) 150 MG CAPS Take by mouth 2 (two) times daily.        . rosuvastatin (CRESTOR) 5 MG tablet Take by mouth daily.        . travoprost, benzalkonium, (TRAVATAN) 0.004 % ophthalmic solution 1 drop as directed.          Allergies  Allergen Reactions  . Ambien   . Shellfish-Derived Products     Patient Active Problem List  Diagnoses  . DIABETES MELLITUS, TYPE II, MILD  . B12 DEFICIENCY  . DYSLIPIDEMIA  . ANEMIA-IRON DEFICIENCY  . ANXIETY  . OBSTRUCTIVE SLEEP APNEA  . GLAUCOMA  . HYPERTENSION  . AORTIC STENOSIS  . PHARYNGITIS  . ESOPHAGEAL REFLUX  . GASTRIC ULCER  . GASTRITIS  . DIVERTICULOSIS, COLON  . GI BLEED  . CHEST PAIN  . BLOOD IN STOOL, OCCULT  . Systolic hypertension  . Sinus bradycardia  . CAD (coronary artery disease)  . History of prosthetic aortic valve  . History of diabetic retinopathy  . History of necrotizing fascIItis  . Postoperative anemia  . Nightmares  . Benign hypertensive heart disease without heart failure  . Hx of CABG  . Other general symptoms   . Personal history of colonic polyps  . Iron deficiency anemia  . Gastritis, chronic    History  Smoking status  . Never Smoker   Smokeless tobacco  . Never Used    History  Alcohol Use  . Yes     rare    Family History  Problem Relation Age of Onset  . Cirrhosis Father   . Alcohol abuse Father   . Rectal cancer Mother   . Diabetes Mother   . Hypertension Sister   . Diabetes Sister     Review of Systems: Constitutional: no fever chills diaphoresis or fatigue or change in weight.  Head and neck: no hearing loss, no epistaxis, no photophobia or visual disturbance. Respiratory: No cough, shortness of breath or wheezing. Cardiovascular: No chest pain peripheral edema, palpitations. Gastrointestinal: No abdominal distention, no abdominal pain, no change in bowel habits hematochezia or melena. Genitourinary: No dysuria, no frequency, no urgency, no nocturia. Musculoskeletal:No arthralgias, no back pain, no gait disturbance or myalgias. Neurological: No dizziness, no headaches, no numbness, no seizures, no syncope, no weakness, no tremors. Hematologic: No lymphadenopathy, no easy bruising. Psychiatric: No confusion, no hallucinations, no sleep disturbance.    Physical Exam: Filed Vitals:   04/20/11 1100  BP: 144/72  Pulse: 75   General appearance reveals a well-developed, well-nourished, gentleman in no distress.Pupils equal and reactive.   Extraocular Movements are full.  There is no scleral icterus.  The mouth and pharynx are normal.  The neck is supple.  The carotids reveal no bruits.  The jugular venous pressure is normal.  The thyroid is not enlarged.  There is no lymphadenopathy.  The chest is clear to percussion and auscultation. There are no rales or rhonchi. Expansion of the chest is symmetrical.  Heart reveals a grade 2/6 systolic ejection murmur across the prosthetic aortic valve.  No diastolic murmur.  No gallop or rub.  He does have a palpable click just to the left of his midsternum on palpation.The abdomen is soft and nontender. Bowel sounds are normal. The liver and spleen are not enlarged. There Are no abdominal masses. There are no bruits.  The pedal pulses  are good.  There is no phlebitis or edema.  There is no cyanosis or clubbing. Strength is normal and symmetrical in all extremities.  There is no lateralizing weakness.  There are no sensory deficits.  The skin is warm and dry.  There is no rash. Strength is normal and symmetrical in all extremities.  There is no lateralizing weakness.  There are no sensory deficits.  The EKG shows normal sinus rhythm, poor R-wave progression, V1 through  V3, and no ischemic changes.  Assessment   Continue on same medication.  Recheck in 4 months for followup office visit

## 2011-04-27 ENCOUNTER — Ambulatory Visit (INDEPENDENT_AMBULATORY_CARE_PROVIDER_SITE_OTHER): Payer: Medicare Other | Admitting: Gastroenterology

## 2011-04-27 DIAGNOSIS — E538 Deficiency of other specified B group vitamins: Secondary | ICD-10-CM

## 2011-04-27 MED ORDER — CYANOCOBALAMIN 1000 MCG/ML IJ SOLN
1000.0000 ug | Freq: Once | INTRAMUSCULAR | Status: AC
  Administered 2011-04-27: 1000 ug via INTRAMUSCULAR

## 2011-05-02 ENCOUNTER — Other Ambulatory Visit: Payer: Self-pay | Admitting: Cardiology

## 2011-06-01 ENCOUNTER — Ambulatory Visit (INDEPENDENT_AMBULATORY_CARE_PROVIDER_SITE_OTHER): Payer: Medicare Other | Admitting: Gastroenterology

## 2011-06-01 DIAGNOSIS — E538 Deficiency of other specified B group vitamins: Secondary | ICD-10-CM

## 2011-06-01 MED ORDER — CYANOCOBALAMIN 1000 MCG/ML IJ SOLN
1000.0000 ug | Freq: Once | INTRAMUSCULAR | Status: AC
  Administered 2011-06-01: 1000 ug via INTRAMUSCULAR

## 2011-07-06 ENCOUNTER — Ambulatory Visit (INDEPENDENT_AMBULATORY_CARE_PROVIDER_SITE_OTHER): Payer: Medicare Other | Admitting: Gastroenterology

## 2011-07-06 DIAGNOSIS — E538 Deficiency of other specified B group vitamins: Secondary | ICD-10-CM

## 2011-07-06 MED ORDER — CYANOCOBALAMIN 1000 MCG/ML IJ SOLN
1000.0000 ug | Freq: Once | INTRAMUSCULAR | Status: AC
  Administered 2011-07-06: 1000 ug via INTRAMUSCULAR

## 2011-07-06 MED ORDER — CYANOCOBALAMIN 1000 MCG PO TABS: 1000.0000 ug | ORAL_TABLET | Freq: Every day | ORAL | Status: DC

## 2011-07-20 DIAGNOSIS — I10 Essential (primary) hypertension: Secondary | ICD-10-CM | POA: Diagnosis not present

## 2011-07-20 DIAGNOSIS — I2510 Atherosclerotic heart disease of native coronary artery without angina pectoris: Secondary | ICD-10-CM | POA: Diagnosis not present

## 2011-07-20 DIAGNOSIS — Z23 Encounter for immunization: Secondary | ICD-10-CM | POA: Diagnosis not present

## 2011-07-20 DIAGNOSIS — G4733 Obstructive sleep apnea (adult) (pediatric): Secondary | ICD-10-CM | POA: Diagnosis not present

## 2011-07-20 DIAGNOSIS — E1139 Type 2 diabetes mellitus with other diabetic ophthalmic complication: Secondary | ICD-10-CM | POA: Diagnosis not present

## 2011-07-27 DIAGNOSIS — D235 Other benign neoplasm of skin of trunk: Secondary | ICD-10-CM | POA: Diagnosis not present

## 2011-07-27 DIAGNOSIS — L219 Seborrheic dermatitis, unspecified: Secondary | ICD-10-CM | POA: Diagnosis not present

## 2011-08-10 ENCOUNTER — Ambulatory Visit (INDEPENDENT_AMBULATORY_CARE_PROVIDER_SITE_OTHER): Payer: Medicare Other | Admitting: Gastroenterology

## 2011-08-10 DIAGNOSIS — E538 Deficiency of other specified B group vitamins: Secondary | ICD-10-CM | POA: Diagnosis not present

## 2011-08-10 MED ORDER — CYANOCOBALAMIN 1000 MCG/ML IJ SOLN
1000.0000 ug | INTRAMUSCULAR | Status: AC
  Administered 2011-08-10 – 2012-02-01 (×6): 1000 ug via INTRAMUSCULAR

## 2011-08-18 ENCOUNTER — Encounter: Payer: Self-pay | Admitting: *Deleted

## 2011-08-31 ENCOUNTER — Ambulatory Visit: Payer: Medicare Other | Admitting: Cardiology

## 2011-09-01 ENCOUNTER — Ambulatory Visit (INDEPENDENT_AMBULATORY_CARE_PROVIDER_SITE_OTHER): Payer: Medicare Other | Admitting: Cardiology

## 2011-09-01 ENCOUNTER — Encounter: Payer: Self-pay | Admitting: Cardiology

## 2011-09-01 VITALS — BP 120/68 | HR 80 | Ht 67.0 in | Wt 201.0 lb

## 2011-09-01 DIAGNOSIS — I119 Hypertensive heart disease without heart failure: Secondary | ICD-10-CM

## 2011-09-01 DIAGNOSIS — E119 Type 2 diabetes mellitus without complications: Secondary | ICD-10-CM

## 2011-09-01 DIAGNOSIS — I359 Nonrheumatic aortic valve disorder, unspecified: Secondary | ICD-10-CM

## 2011-09-01 DIAGNOSIS — E7800 Pure hypercholesterolemia, unspecified: Secondary | ICD-10-CM | POA: Diagnosis not present

## 2011-09-01 DIAGNOSIS — Z951 Presence of aortocoronary bypass graft: Secondary | ICD-10-CM

## 2011-09-01 DIAGNOSIS — Z952 Presence of prosthetic heart valve: Secondary | ICD-10-CM

## 2011-09-01 NOTE — Assessment & Plan Note (Addendum)
The patient has a history of diabetes.  He is not having any hypoglycemic events.  He is on Lantus insulin.  He is also on Glucotrol and metformin.

## 2011-09-01 NOTE — Progress Notes (Signed)
Nathan [name] Date of Birth:  08-23-1945 [facility] [address] [place], [place]  [address] [phone]         Fax   [phone]  History of Present Illness: This pleasant 66 year old gentleman is seen for a month followup office visit.  He has a history of coronary artery bypass graft surgery and aortic valve replacement for severe aortic stenosis in April 2010.  He has a tissue valve.  He had a stress test on 10/08/09 showing normal ejection fraction of 56% and no ischemia.  His last echocardiogram 11/2509 showed a normally functioning prosthetic aortic valve and he had normal left ventricular systolic function with grade 1 diastolic dysfunction.  Patient has a history of high blood pressure diabetes and hypercholesterolemia and also history of vitamin B deficiency for which she receives monthly B12 shots.  He has a past history of nightmares and night terrors.  Current Outpatient Prescriptions  Medication Sig Dispense Refill  . ALPRAZolam (XANAX) 0.25 MG tablet Take 0.25 mg by mouth as needed.        Marland Kitchen amLODipine (NORVASC) 5 MG tablet Take 5 mg by mouth daily.        Marland Kitchen aspirin 325 MG tablet Take 325 mg by mouth daily.        Marland Kitchen BENICAR 40 MG tablet TAKE ONE TABLET BY MOUTH ONE TIME DAILY  30 each  6  . Cholecalciferol (VITAMIN D3) 1000 UNITS capsule Take 2,000 Units by mouth daily.       . fish oil-omega-3 fatty acids 1000 MG capsule Take 1 g by mouth daily.        Marland Kitchen glipiZIDE (GLUCOTROL) 5 MG tablet Take 5 mg by mouth daily.        . hydrochlorothiazide (,MICROZIDE/HYDRODIURIL,) 12.5 MG capsule Take 1 capsule (12.5 mg total) by mouth daily.  30 capsule  11  . hydrocodone-acetaminophen (LORCET-HD) 5-500 MG per capsule Take 1 capsule by mouth as needed.        . insulin glargine (LANTUS SOLOSTAR) 100 UNIT/ML injection Inject 14 Units into the skin every morning.       . latanoprost (XALATAN) 0.005 % ophthalmic solution Place 1 drop into both eyes at bedtime.         Marland Kitchen loratadine-pseudoephedrine (CLARITIN-D 12-HOUR) 5-120 MG per tablet Take 1 tablet by mouth as needed.        . metFORMIN (GLUCOPHAGE) 1000 MG tablet Take 1,000 mg by mouth 2 (two) times daily with a meal.        . metoprolol (LOPRESSOR) 50 MG tablet Take by mouth 2 (two) times daily.        . minoxidil (LONITEN) 2.5 MG tablet Take 2.5 mg by mouth daily.        . Multiple Vitamin (MULTIVITAMIN) tablet Take 1 tablet by mouth daily.        Marland Kitchen omeprazole (PRILOSEC) 40 MG capsule Take one tablet by mouth twice a day  60 capsule  11  . rosuvastatin (CRESTOR) 5 MG tablet Take by mouth daily.        Marland Kitchen VITAMIN B1-B12 IM Inject into the muscle. monthly      . B-D ULTRAFINE III SHORT PEN 31G X 8 MM MISC as directed.      . methylphenidate (RITALIN) 10 MG tablet Take 10 mg by mouth Twice daily.       Current Facility-Administered Medications  Medication Dose Route Frequency Provider Last Rate Last Dose  . cyanocobalamin ((VITAMIN B-12)) injection 1,000  mcg  1,000 mcg Intramuscular Q30 days Sheryn Bison, MD   1,000 mcg at 08/10/11 0827    Allergies  Allergen Reactions  . Ambien   . Shellfish-Derived Products     Patient Active Problem List  Diagnoses  . DIABETES MELLITUS, TYPE II, MILD  . B12 DEFICIENCY  . DYSLIPIDEMIA  . ANEMIA-IRON DEFICIENCY  . ANXIETY  . OBSTRUCTIVE SLEEP APNEA  . GLAUCOMA  . HYPERTENSION  . AORTIC STENOSIS  . PHARYNGITIS  . ESOPHAGEAL REFLUX  . GASTRIC ULCER  . GASTRITIS  . DIVERTICULOSIS, COLON  . GI BLEED  . CHEST PAIN  . BLOOD IN STOOL, OCCULT  . Systolic hypertension  . Sinus bradycardia  . CAD (coronary artery disease)  . History of prosthetic aortic valve  . History of diabetic retinopathy  . History of necrotizing fascIItis  . Postoperative anemia  . Nightmares  . Benign hypertensive heart disease without heart failure  . Hx of CABG  . Other general symptoms   . Personal history of colonic polyps  . Iron deficiency anemia  . Gastritis,  chronic    History  Smoking status  . Never Smoker   Smokeless tobacco  . Never Used    History  Alcohol Use  . Yes    rare    Family History  Problem Relation Age of Onset  . Cirrhosis Father   . Alcohol abuse Father   . Rectal cancer Mother   . Diabetes Mother   . Hypertension Sister   . Diabetes Sister     Review of Systems: Constitutional: no fever chills diaphoresis or fatigue or change in weight.  Head and neck: no hearing loss, no epistaxis, no photophobia or visual disturbance. Respiratory: No cough, shortness of breath or wheezing. Cardiovascular: No chest pain peripheral edema, palpitations. Gastrointestinal: No abdominal distention, no abdominal pain, no change in bowel habits hematochezia or melena. Genitourinary: No dysuria, no frequency, no urgency, no nocturia. Musculoskeletal:No arthralgias, no back pain, no gait disturbance or myalgias. Neurological: No dizziness, no headaches, no numbness, no seizures, no syncope, no weakness, no tremors. Hematologic: No lymphadenopathy, no easy bruising. Psychiatric: No confusion, no hallucinations, no sleep disturbance.    Physical Exam: Filed Vitals:   09/01/11 1556  BP: 120/68  Pulse: 80   The general appearance reveals a well-developed well-nourished gentleman in no distress.Pupils equal and reactive.   Extraocular Movements are full.  There is no scleral icterus.  The mouth and pharynx are normal.  The neck is supple.  The carotids reveal no bruits.  The jugular venous pressure is normal.  The thyroid is not enlarged.  There is no lymphadenopathy.  The chest is clear to percussion and auscultation. There are no rales or rhonchi. Expansion of the chest is symmetrical.  He has a grade 2/6 systolic ejection murmur at the base which radiates toward the neck and into both carotids.  No diastolic murmur.  No gallop or rubThe abdomen is soft and nontender. Bowel sounds are normal. The liver and spleen are not  enlarged. There Are no abdominal masses. There are no bruits.  The pedal pulses are good.  There is no phlebitis or edema.  There is no cyanosis or clubbing. Strength is normal and symmetrical in all extremities.  There is no lateralizing weakness.  There are no sensory deficits.  The skin is warm and dry.  There is no rash.   Assessment / Plan: Continue same medication.  Recheck in 4 months for office visit and EKG.

## 2011-09-01 NOTE — Patient Instructions (Signed)
Your physician recommends that you schedule a follow-up appointment in: 4 months with an ekg

## 2011-09-01 NOTE — Assessment & Plan Note (Signed)
The patient has not been expressing any recurrent chest pain or angina.  He does some walking for exercise but not vigorous walking.

## 2011-09-01 NOTE — Assessment & Plan Note (Signed)
Blood pressure has been stable at home on current therapy.  He has gained 4 pounds since last visit and has been less physically active joint winter.  He said no shortness of breath or symptoms of CHF.

## 2011-09-08 ENCOUNTER — Ambulatory Visit (INDEPENDENT_AMBULATORY_CARE_PROVIDER_SITE_OTHER): Payer: Medicare Other | Admitting: Gastroenterology

## 2011-09-08 DIAGNOSIS — E538 Deficiency of other specified B group vitamins: Secondary | ICD-10-CM

## 2011-10-20 ENCOUNTER — Ambulatory Visit (INDEPENDENT_AMBULATORY_CARE_PROVIDER_SITE_OTHER): Payer: Medicare Other | Admitting: Gastroenterology

## 2011-10-20 DIAGNOSIS — E538 Deficiency of other specified B group vitamins: Secondary | ICD-10-CM

## 2011-10-26 DIAGNOSIS — M2040 Other hammer toe(s) (acquired), unspecified foot: Secondary | ICD-10-CM | POA: Diagnosis not present

## 2011-10-26 DIAGNOSIS — E1149 Type 2 diabetes mellitus with other diabetic neurological complication: Secondary | ICD-10-CM | POA: Diagnosis not present

## 2011-11-10 DIAGNOSIS — H2510 Age-related nuclear cataract, unspecified eye: Secondary | ICD-10-CM | POA: Diagnosis not present

## 2011-11-10 DIAGNOSIS — H4011X0 Primary open-angle glaucoma, stage unspecified: Secondary | ICD-10-CM | POA: Diagnosis not present

## 2011-11-10 DIAGNOSIS — E1139 Type 2 diabetes mellitus with other diabetic ophthalmic complication: Secondary | ICD-10-CM | POA: Diagnosis not present

## 2011-11-10 DIAGNOSIS — E11349 Type 2 diabetes mellitus with severe nonproliferative diabetic retinopathy without macular edema: Secondary | ICD-10-CM | POA: Diagnosis not present

## 2011-11-23 ENCOUNTER — Ambulatory Visit (INDEPENDENT_AMBULATORY_CARE_PROVIDER_SITE_OTHER): Payer: Medicare Other | Admitting: Gastroenterology

## 2011-11-23 DIAGNOSIS — I10 Essential (primary) hypertension: Secondary | ICD-10-CM | POA: Diagnosis not present

## 2011-11-23 DIAGNOSIS — E1139 Type 2 diabetes mellitus with other diabetic ophthalmic complication: Secondary | ICD-10-CM | POA: Diagnosis not present

## 2011-11-23 DIAGNOSIS — E538 Deficiency of other specified B group vitamins: Secondary | ICD-10-CM | POA: Diagnosis not present

## 2011-11-23 DIAGNOSIS — E785 Hyperlipidemia, unspecified: Secondary | ICD-10-CM | POA: Diagnosis not present

## 2011-11-23 DIAGNOSIS — I2510 Atherosclerotic heart disease of native coronary artery without angina pectoris: Secondary | ICD-10-CM | POA: Diagnosis not present

## 2011-12-23 DIAGNOSIS — E119 Type 2 diabetes mellitus without complications: Secondary | ICD-10-CM | POA: Diagnosis not present

## 2011-12-23 DIAGNOSIS — H2510 Age-related nuclear cataract, unspecified eye: Secondary | ICD-10-CM | POA: Diagnosis not present

## 2011-12-29 ENCOUNTER — Other Ambulatory Visit: Payer: Medicare Other

## 2011-12-29 ENCOUNTER — Ambulatory Visit (INDEPENDENT_AMBULATORY_CARE_PROVIDER_SITE_OTHER): Payer: Medicare Other | Admitting: Gastroenterology

## 2011-12-29 ENCOUNTER — Encounter: Payer: Self-pay | Admitting: Gastroenterology

## 2011-12-29 VITALS — BP 114/60 | HR 64 | Ht 67.0 in | Wt 203.0 lb

## 2011-12-29 DIAGNOSIS — Z860100 Personal history of colon polyps, unspecified: Secondary | ICD-10-CM

## 2011-12-29 DIAGNOSIS — K219 Gastro-esophageal reflux disease without esophagitis: Secondary | ICD-10-CM

## 2011-12-29 DIAGNOSIS — R197 Diarrhea, unspecified: Secondary | ICD-10-CM | POA: Diagnosis not present

## 2011-12-29 DIAGNOSIS — D509 Iron deficiency anemia, unspecified: Secondary | ICD-10-CM

## 2011-12-29 DIAGNOSIS — E538 Deficiency of other specified B group vitamins: Secondary | ICD-10-CM

## 2011-12-29 DIAGNOSIS — K2940 Chronic atrophic gastritis without bleeding: Secondary | ICD-10-CM

## 2011-12-29 DIAGNOSIS — K2950 Unspecified chronic gastritis without bleeding: Secondary | ICD-10-CM

## 2011-12-29 DIAGNOSIS — E669 Obesity, unspecified: Secondary | ICD-10-CM

## 2011-12-29 MED ORDER — OMEPRAZOLE 40 MG PO CPDR: DELAYED_RELEASE_CAPSULE | ORAL | Status: DC

## 2011-12-29 NOTE — Progress Notes (Signed)
This is an obese 66 year old white male with insulin-dependent diabetes, hypertension, chronic anxiety, B12 deficiency, and chronic acid reflux doing well on Prilosec 40 mg twice a day. He denies gastrointestinal symptoms at this time, and is up-to-date on his endoscopy and colonoscopies. He did have removal of her right colon villous adenoma, and is scheduled for followup exam within the next one to 2 years. He is having regular bowel movements without melena or hematochezia, and denies dyspepsia, reflux symptoms, dysphagia, gastroparesis, or any hepatobiliary problems. His appetite is good and his weight is stable. He is followed medically by Dr. Jarome Matin .  Current Medications, Allergies, Past Medical History, Past Surgical History, Family History and Social History were reviewed in [employer] record.  Pertinent Review of Systems Negative   Physical Exam: Healthy-appearing patient in no distress. Blood pressure 114/60, pulse 64 and regular, weight 203 pounds with a BMI of 31.79. I cannot appreciate stigmata of chronic liver disease. His chest is clear he appears be in a regular rhythm without murmurs gallops or rubs. He has obese abdomen but no definite organomegaly, masses or tenderness. Bowel sounds are normal. Peripheral extremities are unremarkable and mental status is normal. Rectal exam is deferred.    Assessment and Plan: Chronic GERD doing well on PPI therapy. He will try to decrease his Prilosec to 40 mg once a day as tolerated. He is been unsuccessful at weight loss, but I again reviewed a caloric restricted diet with gradual aerobic exercise. The patient is not interested in bariatric surgery, and I would not recommend that at his age. He is to continue other medications for his diabetes and hypertension per primary care. Review of his labs shows normal CBC and negative stool cards for occult blood. Encounter Diagnoses  Name Primary?  . B12 deficiency Yes    . Diarrhea   . Gastritis, chronic   . Iron deficiency anemia   . Personal history of colonic polyps

## 2011-12-29 NOTE — Patient Instructions (Addendum)
You have been given a B12 injection today. Please go to the basement lab for your stool test instructions.   Your Prilosec has been refilled for 1 year, this was electronically sent to your pharmacy.

## 2012-01-04 ENCOUNTER — Encounter: Payer: Self-pay | Admitting: Cardiology

## 2012-01-04 ENCOUNTER — Ambulatory Visit (INDEPENDENT_AMBULATORY_CARE_PROVIDER_SITE_OTHER): Payer: Medicare Other | Admitting: Cardiology

## 2012-01-04 VITALS — BP 118/70 | HR 67 | Ht 67.0 in | Wt 201.0 lb

## 2012-01-04 DIAGNOSIS — E119 Type 2 diabetes mellitus without complications: Secondary | ICD-10-CM

## 2012-01-04 DIAGNOSIS — Z954 Presence of other heart-valve replacement: Secondary | ICD-10-CM | POA: Diagnosis not present

## 2012-01-04 DIAGNOSIS — E7800 Pure hypercholesterolemia, unspecified: Secondary | ICD-10-CM | POA: Diagnosis not present

## 2012-01-04 DIAGNOSIS — I119 Hypertensive heart disease without heart failure: Secondary | ICD-10-CM

## 2012-01-04 DIAGNOSIS — Z951 Presence of aortocoronary bypass graft: Secondary | ICD-10-CM | POA: Diagnosis not present

## 2012-01-04 DIAGNOSIS — Z952 Presence of prosthetic heart valve: Secondary | ICD-10-CM

## 2012-01-04 NOTE — Assessment & Plan Note (Signed)
The patient has not been experiencing any exertional chest pain or angina pectoris.  He walks in his neighborhood for exercise.  He is still working 36 hours per week.  He hopes to retire in several years when his wife who is a Engineer, materials.

## 2012-01-04 NOTE — Patient Instructions (Addendum)
Continue careful diet  Your physician recommends that you continue on your current medications as directed. Please refer to the Current Medication list given to you today.  Your physician wants you to follow-up in: 4 months You will receive a reminder letter in the mail two months in advance. If you don't receive a letter, please call our office to schedule the follow-up appointment.

## 2012-01-04 NOTE — Assessment & Plan Note (Signed)
The patient has not been having any headaches or dizziness.  No symptoms of congestive heart failure.  No shortness of breath or orthopnea or paroxysmal nocturnal dyspnea.

## 2012-01-04 NOTE — Progress Notes (Signed)
Nathan [name] Date of Birth:  06-Dec-1945 [facility] [address] [place], [place]  [address] [phone]  Fax   [phone]  HPI:  This pleasant 66 year old gentleman is seen for a scheduled four-month followup office visit.  He has a history of coronary artery bypass graft surgery and aortic valve replacement for severe aortic stenosis in April 2010.  He has a tissue valve.  His last echocardiogram 12/02/09 showed a normally functioning prosthetic aortic valve and showed normal LV systolic function with grade 1 diastolic dysfunction.  The patient has a history of high blood pressure, diabetes, and hypercholesterolemia.  Also has a history of nightmares and night terrors.  Current Outpatient Prescriptions  Medication Sig Dispense Refill  . ALPRAZolam (XANAX) 0.25 MG tablet Take 0.25 mg by mouth as needed.        Marland Kitchen amLODipine (NORVASC) 5 MG tablet Take 5 mg by mouth daily.        Marland Kitchen aspirin 325 MG tablet Take 325 mg by mouth daily.        . B-D ULTRAFINE III SHORT PEN 31G X 8 MM MISC as directed.      Marland Kitchen BENICAR 40 MG tablet TAKE ONE TABLET BY MOUTH ONE TIME DAILY  30 each  6  . Cholecalciferol (VITAMIN D3) 1000 UNITS capsule Take 2,000 Units by mouth daily.       . fish oil-omega-3 fatty acids 1000 MG capsule Take 1 g by mouth daily.        Marland Kitchen glipiZIDE (GLUCOTROL) 5 MG tablet Take 5 mg by mouth daily.        . hydrochlorothiazide (,MICROZIDE/HYDRODIURIL,) 12.5 MG capsule Take 1 capsule (12.5 mg total) by mouth daily.  30 capsule  11  . hydrocodone-acetaminophen (LORCET-HD) 5-500 MG per capsule Take 1 capsule by mouth as needed.        . insulin glargine (LANTUS SOLOSTAR) 100 UNIT/ML injection Inject 14 Units into the skin every morning.       . latanoprost (XALATAN) 0.005 % ophthalmic solution Place 1 drop into both eyes at bedtime.        Marland Kitchen loratadine-pseudoephedrine (CLARITIN-D 12-HOUR) 5-120 MG per tablet Take 1 tablet by mouth as needed.        . metFORMIN  (GLUCOPHAGE) 1000 MG tablet Take 1,000 mg by mouth 2 (two) times daily with a meal.        . methylphenidate (RITALIN) 10 MG tablet Take 10 mg by mouth Twice daily.      . metoprolol (LOPRESSOR) 50 MG tablet Take by mouth 2 (two) times daily.        . minoxidil (LONITEN) 2.5 MG tablet Take 2.5 mg by mouth daily.        . Multiple Vitamin (MULTIVITAMIN) tablet Take 1 tablet by mouth daily.        Marland Kitchen omeprazole (PRILOSEC) 40 MG capsule Take one tablet by mouth twice a day  60 capsule  11  . rosuvastatin (CRESTOR) 5 MG tablet Take by mouth daily.        Marland Kitchen VITAMIN B1-B12 IM Inject into the muscle. monthly       Current Facility-Administered Medications  Medication Dose Route Frequency Provider Last Rate Last Dose  . cyanocobalamin ((VITAMIN B-12)) injection 1,000 mcg  1,000 mcg Intramuscular Q30 days Mardella Layman, MD   1,000 mcg at 12/29/11 1016    Allergies  Allergen Reactions  . Shellfish-Derived Products   . Zolpidem Tartrate Other (See Comments)    Hallucinations  Patient Active Problem List  Diagnosis  . DIABETES MELLITUS, TYPE II, MILD  . B12 DEFICIENCY  . DYSLIPIDEMIA  . ANEMIA-IRON DEFICIENCY  . ANXIETY  . OBSTRUCTIVE SLEEP APNEA  . GLAUCOMA  . HYPERTENSION  . AORTIC STENOSIS  . PHARYNGITIS  . ESOPHAGEAL REFLUX  . GASTRIC ULCER  . GASTRITIS  . DIVERTICULOSIS, COLON  . GI BLEED  . CHEST PAIN  . BLOOD IN STOOL, OCCULT  . Systolic hypertension  . Sinus bradycardia  . CAD (coronary artery disease)  . History of prosthetic aortic valve  . History of diabetic retinopathy  . History of necrotizing fascIItis  . Postoperative anemia  . Nightmares  . Benign hypertensive heart disease without heart failure  . Hx of CABG  . Other general symptoms   . Personal history of colonic polyps  . Iron deficiency anemia  . Gastritis, chronic    History  Smoking status  . Never Smoker   Smokeless tobacco  . Never Used    History  Alcohol Use  . Yes    rare     Family History  Problem Relation Age of Onset  . Cirrhosis Father   . Alcohol abuse Father   . Rectal cancer Mother   . Diabetes Mother   . Hypertension Sister   . Diabetes Sister     Review of Systems: The patient denies any heat or cold intolerance.  No weight gain or weight loss.  The patient denies headaches or blurry vision.  There is no cough or sputum production.  The patient denies dizziness.  There is no hematuria or hematochezia.  The patient denies any muscle aches or arthritis.  The patient denies any rash.  The patient denies frequent falling or instability.  There is no history of depression or anxiety.  All other systems were reviewed and are negative.   Physical Exam: Filed Vitals:   01/04/12 1008  BP: 118/70  Pulse: 67   the general appearance reveals a well-developed well-nourished gentleman in no distress.Pupils equal and reactive.   Extraocular Movements are full.  There is no scleral icterus.  The mouth and pharynx are normal.  The neck is supple.  The carotids reveal no bruits.  The jugular venous pressure is normal.  The thyroid is not enlarged.  There is no lymphadenopathy.  The chest is clear to percussion and auscultation. There are no rales or rhonchi. Expansion of the chest is symmetrical.  Heart reveals a soft systolic ejection murmur at the base across the prosthetic aortic valve.  No diastolic murmur.  No gallop or rub. The abdomen is soft and nontender. Bowel sounds are normal. The liver and spleen are not enlarged. There Are no abdominal masses. There are no bruits.  The pedal pulses are good.  There is no phlebitis or edema.  There is no cyanosis or clubbing. Strength is normal and symmetrical in all extremities.  There is no lateralizing weakness.  There are no sensory deficits.  The skin is warm and dry.  There is no rash.  EKG today shows normal sinus rhythm and no ischemic changes.   Assessment / Plan: The patient is doing well.  Continue  careful exercise and dietary program.  Recheck in 4 months.  Continue same medication.  He is having some occasional nocturnal leg cramps and he will try drinking more water and see if this makes a difference.  It does not sound at this point as if it is secondary to his statin therapy.

## 2012-01-04 NOTE — Assessment & Plan Note (Signed)
He states that his blood sugars have been remaining steady.  It was 90 this morning.  He is not having any hypoglycemic episodes.

## 2012-01-11 ENCOUNTER — Other Ambulatory Visit: Payer: Medicare Other

## 2012-01-11 DIAGNOSIS — R197 Diarrhea, unspecified: Secondary | ICD-10-CM

## 2012-02-01 ENCOUNTER — Ambulatory Visit (INDEPENDENT_AMBULATORY_CARE_PROVIDER_SITE_OTHER): Payer: Medicare Other | Admitting: Gastroenterology

## 2012-02-01 DIAGNOSIS — E538 Deficiency of other specified B group vitamins: Secondary | ICD-10-CM | POA: Diagnosis not present

## 2012-03-07 ENCOUNTER — Other Ambulatory Visit (INDEPENDENT_AMBULATORY_CARE_PROVIDER_SITE_OTHER): Payer: Medicare Other | Admitting: Gastroenterology

## 2012-03-07 ENCOUNTER — Encounter: Payer: Medicare Other | Admitting: Gastroenterology

## 2012-03-07 DIAGNOSIS — E538 Deficiency of other specified B group vitamins: Secondary | ICD-10-CM

## 2012-03-07 MED ORDER — CYANOCOBALAMIN 1000 MCG/ML IJ SOLN
1000.0000 ug | Freq: Once | INTRAMUSCULAR | Status: AC
  Administered 2012-03-07: 1000 ug via INTRAMUSCULAR

## 2012-04-11 ENCOUNTER — Ambulatory Visit (INDEPENDENT_AMBULATORY_CARE_PROVIDER_SITE_OTHER): Payer: Medicare Other | Admitting: Gastroenterology

## 2012-04-11 DIAGNOSIS — E538 Deficiency of other specified B group vitamins: Secondary | ICD-10-CM

## 2012-04-11 MED ORDER — CYANOCOBALAMIN 1000 MCG/ML IJ SOLN
1000.0000 ug | Freq: Once | INTRAMUSCULAR | Status: AC
  Administered 2012-04-11: 1000 ug via INTRAMUSCULAR

## 2012-04-19 DIAGNOSIS — E11349 Type 2 diabetes mellitus with severe nonproliferative diabetic retinopathy without macular edema: Secondary | ICD-10-CM | POA: Diagnosis not present

## 2012-04-19 DIAGNOSIS — H4011X0 Primary open-angle glaucoma, stage unspecified: Secondary | ICD-10-CM | POA: Diagnosis not present

## 2012-04-25 DIAGNOSIS — Z125 Encounter for screening for malignant neoplasm of prostate: Secondary | ICD-10-CM | POA: Diagnosis not present

## 2012-04-25 DIAGNOSIS — I10 Essential (primary) hypertension: Secondary | ICD-10-CM | POA: Diagnosis not present

## 2012-04-25 DIAGNOSIS — E1139 Type 2 diabetes mellitus with other diabetic ophthalmic complication: Secondary | ICD-10-CM | POA: Diagnosis not present

## 2012-04-25 DIAGNOSIS — M2040 Other hammer toe(s) (acquired), unspecified foot: Secondary | ICD-10-CM | POA: Diagnosis not present

## 2012-04-25 DIAGNOSIS — E785 Hyperlipidemia, unspecified: Secondary | ICD-10-CM | POA: Diagnosis not present

## 2012-05-02 ENCOUNTER — Other Ambulatory Visit: Payer: Self-pay

## 2012-05-02 MED ORDER — OLMESARTAN MEDOXOMIL 40 MG PO TABS: 40.0000 mg | ORAL_TABLET | Freq: Every day | ORAL | Status: DC

## 2012-05-03 DIAGNOSIS — Z1331 Encounter for screening for depression: Secondary | ICD-10-CM | POA: Diagnosis not present

## 2012-05-03 DIAGNOSIS — Z0000 Encounter for general adult medical examination without abnormal findings: Secondary | ICD-10-CM | POA: Diagnosis not present

## 2012-05-03 DIAGNOSIS — I10 Essential (primary) hypertension: Secondary | ICD-10-CM | POA: Diagnosis not present

## 2012-05-03 DIAGNOSIS — Z125 Encounter for screening for malignant neoplasm of prostate: Secondary | ICD-10-CM | POA: Diagnosis not present

## 2012-05-03 DIAGNOSIS — R252 Cramp and spasm: Secondary | ICD-10-CM | POA: Diagnosis not present

## 2012-05-03 DIAGNOSIS — E1139 Type 2 diabetes mellitus with other diabetic ophthalmic complication: Secondary | ICD-10-CM | POA: Diagnosis not present

## 2012-05-03 DIAGNOSIS — Z23 Encounter for immunization: Secondary | ICD-10-CM | POA: Diagnosis not present

## 2012-05-07 DIAGNOSIS — Z1212 Encounter for screening for malignant neoplasm of rectum: Secondary | ICD-10-CM | POA: Diagnosis not present

## 2012-05-16 ENCOUNTER — Ambulatory Visit: Payer: Medicare Other | Admitting: Cardiology

## 2012-05-17 ENCOUNTER — Ambulatory Visit (INDEPENDENT_AMBULATORY_CARE_PROVIDER_SITE_OTHER): Payer: Medicare Other | Admitting: Gastroenterology

## 2012-05-17 DIAGNOSIS — E538 Deficiency of other specified B group vitamins: Secondary | ICD-10-CM

## 2012-05-17 MED ORDER — CYANOCOBALAMIN 1000 MCG/ML IJ SOLN
1000.0000 ug | Freq: Once | INTRAMUSCULAR | Status: AC
  Administered 2012-05-17: 1000 ug via INTRAMUSCULAR

## 2012-05-24 ENCOUNTER — Encounter: Payer: Self-pay | Admitting: Cardiology

## 2012-05-24 ENCOUNTER — Ambulatory Visit (INDEPENDENT_AMBULATORY_CARE_PROVIDER_SITE_OTHER): Payer: Medicare Other | Admitting: Cardiology

## 2012-05-24 VITALS — BP 130/64 | HR 79 | Resp 18 | Ht 67.0 in | Wt 198.4 lb

## 2012-05-24 DIAGNOSIS — Z951 Presence of aortocoronary bypass graft: Secondary | ICD-10-CM

## 2012-05-24 DIAGNOSIS — I119 Hypertensive heart disease without heart failure: Secondary | ICD-10-CM | POA: Diagnosis not present

## 2012-05-24 DIAGNOSIS — Z954 Presence of other heart-valve replacement: Secondary | ICD-10-CM

## 2012-05-24 DIAGNOSIS — Z952 Presence of prosthetic heart valve: Secondary | ICD-10-CM

## 2012-05-24 DIAGNOSIS — E119 Type 2 diabetes mellitus without complications: Secondary | ICD-10-CM

## 2012-05-24 NOTE — Assessment & Plan Note (Signed)
The patient has not been having any hypoglycemic reactions.  He states that his last A1c was 7.1

## 2012-05-24 NOTE — Progress Notes (Signed)
Manley Mason Date of Birth:  1945/12/02 [facility] [address] [place], [place]  [address] [phone]         Fax   [phone]  History of Present Illness: This pleasant 66 year old gentleman is seen for a scheduled four-month followup office visit. He has a history of coronary artery bypass graft surgery and aortic valve replacement for severe aortic stenosis in April 2010. He has a tissue valve. His last echocardiogram 12/02/09 showed a normally functioning prosthetic aortic valve and showed normal LV systolic function with grade 1 diastolic dysfunction. The patient has a history of high blood pressure, diabetes, and hypercholesterolemia. Also has a history of nightmares and night terrors. Since last visit he has been doing well with no new cardiac symptoms.  Current Outpatient Prescriptions  Medication Sig Dispense Refill  . ALPRAZolam (XANAX) 0.25 MG tablet Take 0.25 mg by mouth as needed.        Marland Kitchen amLODipine (NORVASC) 5 MG tablet Take 5 mg by mouth daily.        Marland Kitchen aspirin 325 MG tablet Take 325 mg by mouth daily.        . B-D ULTRAFINE III SHORT PEN 31G X 8 MM MISC as directed.      . Cholecalciferol (VITAMIN D3) 1000 UNITS capsule Take 2,000 Units by mouth daily.       Marland Kitchen co-enzyme Q-10 30 MG capsule Take 100 mg by mouth 2 (two) times daily.      . fish oil-omega-3 fatty acids 1000 MG capsule Take 1 g by mouth daily.        Marland Kitchen glipiZIDE (GLUCOTROL) 5 MG tablet Take 5 mg by mouth daily.        . hydrochlorothiazide (MICROZIDE) 12.5 MG capsule Take 12.5 mg by mouth daily.      . hydrocodone-acetaminophen (LORCET-HD) 5-500 MG per capsule Take 1 capsule by mouth as needed.        . insulin glargine (LANTUS SOLOSTAR) 100 UNIT/ML injection Inject 16 Units into the skin every morning.       . latanoprost (XALATAN) 0.005 % ophthalmic solution Place 1 drop into both eyes at bedtime.        Marland Kitchen loratadine-pseudoephedrine (CLARITIN-D 12-HOUR) 5-120 MG per tablet Take 1  tablet by mouth as needed.        . metFORMIN (GLUCOPHAGE) 1000 MG tablet Take 1,000 mg by mouth 2 (two) times daily with a meal.        . metoprolol (LOPRESSOR) 50 MG tablet Take by mouth 2 (two) times daily.        . minoxidil (LONITEN) 2.5 MG tablet Take 2.5 mg by mouth daily.        . Multiple Vitamin (MULTIVITAMIN) tablet Take 1 tablet by mouth daily.        Marland Kitchen olmesartan (BENICAR) 40 MG tablet Take 1 tablet (40 mg total) by mouth daily.  30 tablet  5  . omeprazole (PRILOSEC) 40 MG capsule Take one tablet by mouth twice a day  60 capsule  11  . rosuvastatin (CRESTOR) 5 MG tablet Take by mouth daily.        Marland Kitchen VITAMIN B1-B12 IM Inject into the muscle. monthly      . [DISCONTINUED] hydrochlorothiazide (,MICROZIDE/HYDRODIURIL,) 12.5 MG capsule Take 1 capsule (12.5 mg total) by mouth daily.  30 capsule  11    Allergies  Allergen Reactions  . Shellfish-Derived Products   . Zolpidem Tartrate Other (See Comments)    Hallucinations  Patient Active Problem List  Diagnosis  . DIABETES MELLITUS, TYPE II, MILD  . B12 DEFICIENCY  . DYSLIPIDEMIA  . ANEMIA-IRON DEFICIENCY  . ANXIETY  . OBSTRUCTIVE SLEEP APNEA  . GLAUCOMA  . HYPERTENSION  . AORTIC STENOSIS  . PHARYNGITIS  . ESOPHAGEAL REFLUX  . GASTRIC ULCER  . GASTRITIS  . DIVERTICULOSIS, COLON  . GI BLEED  . CHEST PAIN  . BLOOD IN STOOL, OCCULT  . Systolic hypertension  . Sinus bradycardia  . CAD (coronary artery disease)  . History of prosthetic aortic valve  . History of diabetic retinopathy  . History of necrotizing fascIItis  . Postoperative anemia  . Nightmares  . Benign hypertensive heart disease without heart failure  . Hx of CABG  . Other general symptoms   . Personal history of colonic polyps  . Iron deficiency anemia  . Gastritis, chronic    History  Smoking status  . Never Smoker   Smokeless tobacco  . Never Used    History  Alcohol Use  . Yes    Comment: rare    Family History  Problem Relation  Age of Onset  . Cirrhosis Father   . Alcohol abuse Father   . Rectal cancer Mother   . Diabetes Mother   . Hypertension Sister   . Diabetes Sister     Review of Systems: Constitutional: no fever chills diaphoresis or fatigue or change in weight.  Head and neck: no hearing loss, no epistaxis, no photophobia or visual disturbance. Respiratory: No cough, shortness of breath or wheezing. Cardiovascular: No chest pain peripheral edema, palpitations. Gastrointestinal: No abdominal distention, no abdominal pain, no change in bowel habits hematochezia or melena. Genitourinary: No dysuria, no frequency, no urgency, no nocturia. Musculoskeletal:No arthralgias, no back pain, no gait disturbance or myalgias. Neurological: No dizziness, no headaches, no numbness, no seizures, no syncope, no weakness, no tremors. Hematologic: No lymphadenopathy, no easy bruising. Psychiatric: No confusion, no hallucinations, no sleep disturbance.    Physical Exam: Filed Vitals:   05/24/12 1430  BP: 130/64  Pulse: 79  Resp: 18   the general appearance reveals a well-developed well-nourished gentleman in no distress.The head and neck exam reveals pupils equal and reactive.  Extraocular movements are full.  There is no scleral icterus.  The mouth and pharynx are normal.  The neck is supple.  The carotids reveal no bruits.  The jugular venous pressure is normal.  The  thyroid is not enlarged.  There is no lymphadenopathy.  The chest is clear to percussion and auscultation.  There are no rales or rhonchi.  Expansion of the chest is symmetrical.  The precordium is quiet.  The first heart sound is normal.  The second heart sound is physiologically split.  There is a soft systolic ejection murmur across the prosthetic aortic valve.  No diastolic murmur.  No gallop or rub.  There is no abnormal lift or heave.  The abdomen is soft and nontender.  The bowel sounds are normal.  The liver and spleen are not enlarged.  There are no  abdominal masses.  There are no abdominal bruits.  Extremities reveal good pedal pulses.  There is no phlebitis or edema.  There is no cyanosis or clubbing.  Strength is normal and symmetrical in all extremities.  There is no lateralizing weakness.  There are no sensory deficits.  The skin is warm and dry.  There is no rash.     Assessment / Plan: Continue same medication.  Recheck in  4 months for followup office visit.

## 2012-05-24 NOTE — Assessment & Plan Note (Signed)
The patient has not been having any headaches or dizziness.  No symptoms of congestive heart failure.

## 2012-05-24 NOTE — Assessment & Plan Note (Signed)
His exercise tolerance is good and he is not experiencing any exertional chest pain or angina pectoris.

## 2012-05-24 NOTE — Patient Instructions (Addendum)
Your physician recommends that you continue on your current medications as directed. Please refer to the Current Medication list given to you today.  Your physician recommends that you schedule a follow-up appointment in: 4 MONTHS  

## 2012-06-20 ENCOUNTER — Ambulatory Visit (INDEPENDENT_AMBULATORY_CARE_PROVIDER_SITE_OTHER): Payer: Medicare Other | Admitting: Gastroenterology

## 2012-06-20 DIAGNOSIS — E538 Deficiency of other specified B group vitamins: Secondary | ICD-10-CM | POA: Diagnosis not present

## 2012-06-20 MED ORDER — CYANOCOBALAMIN 1000 MCG/ML IJ SOLN
1000.0000 ug | INTRAMUSCULAR | Status: AC
  Administered 2012-06-20 – 2012-07-19 (×2): 1000 ug via INTRAMUSCULAR

## 2012-07-19 ENCOUNTER — Ambulatory Visit (INDEPENDENT_AMBULATORY_CARE_PROVIDER_SITE_OTHER): Payer: Medicare Other | Admitting: Gastroenterology

## 2012-07-19 DIAGNOSIS — E538 Deficiency of other specified B group vitamins: Secondary | ICD-10-CM

## 2012-08-09 DIAGNOSIS — I2510 Atherosclerotic heart disease of native coronary artery without angina pectoris: Secondary | ICD-10-CM | POA: Diagnosis not present

## 2012-08-09 DIAGNOSIS — G4733 Obstructive sleep apnea (adult) (pediatric): Secondary | ICD-10-CM | POA: Diagnosis not present

## 2012-08-09 DIAGNOSIS — I10 Essential (primary) hypertension: Secondary | ICD-10-CM | POA: Diagnosis not present

## 2012-08-09 DIAGNOSIS — E1139 Type 2 diabetes mellitus with other diabetic ophthalmic complication: Secondary | ICD-10-CM | POA: Diagnosis not present

## 2012-08-29 ENCOUNTER — Ambulatory Visit (INDEPENDENT_AMBULATORY_CARE_PROVIDER_SITE_OTHER): Payer: Medicare Other | Admitting: Gastroenterology

## 2012-08-29 DIAGNOSIS — E538 Deficiency of other specified B group vitamins: Secondary | ICD-10-CM | POA: Diagnosis not present

## 2012-08-29 MED ORDER — CYANOCOBALAMIN 1000 MCG/ML IJ SOLN
1000.0000 ug | Freq: Once | INTRAMUSCULAR | Status: AC
  Administered 2012-08-29: 1000 ug via INTRAMUSCULAR

## 2012-09-13 DIAGNOSIS — E11349 Type 2 diabetes mellitus with severe nonproliferative diabetic retinopathy without macular edema: Secondary | ICD-10-CM | POA: Diagnosis not present

## 2012-09-13 DIAGNOSIS — H2510 Age-related nuclear cataract, unspecified eye: Secondary | ICD-10-CM | POA: Diagnosis not present

## 2012-09-13 DIAGNOSIS — H4011X0 Primary open-angle glaucoma, stage unspecified: Secondary | ICD-10-CM | POA: Diagnosis not present

## 2012-09-26 ENCOUNTER — Encounter: Payer: Self-pay | Admitting: Cardiology

## 2012-09-26 ENCOUNTER — Ambulatory Visit (INDEPENDENT_AMBULATORY_CARE_PROVIDER_SITE_OTHER): Payer: Medicare Other | Admitting: Cardiology

## 2012-09-26 VITALS — BP 124/68 | HR 69 | Ht 67.0 in | Wt 198.0 lb

## 2012-09-26 DIAGNOSIS — I2510 Atherosclerotic heart disease of native coronary artery without angina pectoris: Secondary | ICD-10-CM | POA: Diagnosis not present

## 2012-09-26 DIAGNOSIS — I119 Hypertensive heart disease without heart failure: Secondary | ICD-10-CM

## 2012-09-26 NOTE — Assessment & Plan Note (Signed)
The patient is status post CABG.  He is not having any recurrent angina pectoris

## 2012-09-26 NOTE — Patient Instructions (Addendum)
Your physician recommends that you continue on your current medications as directed. Please refer to the Current Medication list given to you today.  Your physician wants you to follow-up in: 4 month ov/ekg  You will receive a reminder letter in the mail two months in advance. If you don't receive a letter, please call our office to schedule the follow-up appointment.  

## 2012-09-26 NOTE — Assessment & Plan Note (Signed)
The patient has dyslipidemia and is on statins followed by his PCP.  He is not having any myalgias from the Crestor 5 mg daily.

## 2012-09-26 NOTE — Progress Notes (Signed)
Nathan [name] Date of Birth:  1946-03-14 [facility] [address] [place], [place]  [address] [phone]         Fax   [phone]  History of Present Illness: This pleasant 67 year old gentleman is seen for a scheduled four-month followup office visit. He has a history of coronary artery bypass graft surgery and aortic valve replacement for severe aortic stenosis in April 2010. He has a tissue valve. His last echocardiogram 12/02/09 showed a normally functioning prosthetic aortic valve and showed normal LV systolic function with grade 1 diastolic dysfunction. The patient has a history of high blood pressure, diabetes, and hypercholesterolemia. Also has a history of nightmares and night terrors.  Since last visit he has been doing well with no new cardiac symptoms.  He slipped on the ice several weeks ago while he was taking down a heavy garbage can to the street and injured his back.  Current Outpatient Prescriptions  Medication Sig Dispense Refill  . ALPRAZolam (XANAX) 0.25 MG tablet Take 0.25 mg by mouth as needed.        Marland Kitchen amLODipine (NORVASC) 5 MG tablet Take 5 mg by mouth daily.        Marland Kitchen aspirin 325 MG tablet Take 325 mg by mouth daily.        . B-D ULTRAFINE III SHORT PEN 31G X 8 MM MISC as directed.      . Cholecalciferol (VITAMIN D3) 1000 UNITS capsule Take 2,000 Units by mouth daily.       Marland Kitchen co-enzyme Q-10 30 MG capsule Take 100 mg by mouth 2 (two) times daily.      . fish oil-omega-3 fatty acids 1000 MG capsule Take 1 g by mouth daily.        Marland Kitchen glipiZIDE (GLUCOTROL) 5 MG tablet Take 5 mg by mouth daily.        . hydrochlorothiazide (MICROZIDE) 12.5 MG capsule Take 12.5 mg by mouth daily.      . hydrocodone-acetaminophen (LORCET-HD) 5-500 MG per capsule Take 1 capsule by mouth as needed.        . insulin glargine (LANTUS SOLOSTAR) 100 UNIT/ML injection Inject 16 Units into the skin every morning.       . latanoprost (XALATAN) 0.005 % ophthalmic solution  Place 1 drop into both eyes at bedtime.        Marland Kitchen loratadine-pseudoephedrine (CLARITIN-D 12-HOUR) 5-120 MG per tablet Take 1 tablet by mouth as needed.        . metFORMIN (GLUCOPHAGE) 1000 MG tablet Take 1,000 mg by mouth 2 (two) times daily with a meal.        . metoprolol (LOPRESSOR) 50 MG tablet Take by mouth 2 (two) times daily.        . minoxidil (LONITEN) 2.5 MG tablet Take 2.5 mg by mouth daily.        . Multiple Vitamin (MULTIVITAMIN) tablet Take 1 tablet by mouth daily.        Marland Kitchen olmesartan (BENICAR) 40 MG tablet Take 1 tablet (40 mg total) by mouth daily.  30 tablet  5  . omeprazole (PRILOSEC) 40 MG capsule Take one tablet by mouth twice a day  60 capsule  11  . rosuvastatin (CRESTOR) 5 MG tablet Take by mouth daily.        Marland Kitchen VITAMIN B1-B12 IM Inject into the muscle. monthly       Current Facility-Administered Medications  Medication Dose Route Frequency Provider Last Rate Last Dose  . cyanocobalamin ((VITAMIN B-12))  injection 1,000 mcg  1,000 mcg Intramuscular Q30 days Mardella Layman, MD   1,000 mcg at 07/19/12 0831    Allergies  Allergen Reactions  . Shellfish-Derived Products   . Zolpidem Tartrate Other (See Comments)    Hallucinations    Patient Active Problem List  Diagnosis  . DIABETES MELLITUS, TYPE II, MILD  . B12 DEFICIENCY  . DYSLIPIDEMIA  . ANEMIA-IRON DEFICIENCY  . ANXIETY  . OBSTRUCTIVE SLEEP APNEA  . GLAUCOMA  . HYPERTENSION  . AORTIC STENOSIS  . PHARYNGITIS  . ESOPHAGEAL REFLUX  . GASTRIC ULCER  . GASTRITIS  . DIVERTICULOSIS, COLON  . GI BLEED  . CHEST PAIN  . BLOOD IN STOOL, OCCULT  . Systolic hypertension  . Sinus bradycardia  . CAD (coronary artery disease)  . History of prosthetic aortic valve  . History of diabetic retinopathy  . History of necrotizing fascIItis  . Postoperative anemia  . Nightmares  . Benign hypertensive heart disease without heart failure  . Hx of CABG  . Other general symptoms   . Personal history of colonic  polyps  . Iron deficiency anemia  . Gastritis, chronic    History  Smoking status  . Never Smoker   Smokeless tobacco  . Never Used    History  Alcohol Use  . Yes    Comment: rare    Family History  Problem Relation Age of Onset  . Cirrhosis Father   . Alcohol abuse Father   . Rectal cancer Mother   . Diabetes Mother   . Hypertension Sister   . Diabetes Sister     Review of Systems: Constitutional: no fever chills diaphoresis or fatigue or change in weight.  Head and neck: no hearing loss, no epistaxis, no photophobia or visual disturbance. Respiratory: No cough, shortness of breath or wheezing. Cardiovascular: No chest pain peripheral edema, palpitations. Gastrointestinal: No abdominal distention, no abdominal pain, no change in bowel habits hematochezia or melena. Genitourinary: No dysuria, no frequency, no urgency, no nocturia. Musculoskeletal:No arthralgias, no back pain, no gait disturbance or myalgias. Neurological: No dizziness, no headaches, no numbness, no seizures, no syncope, no weakness, no tremors. Hematologic: No lymphadenopathy, no easy bruising. Psychiatric: No confusion, no hallucinations, no sleep disturbance.    Physical Exam: Filed Vitals:   09/26/12 1416  BP: 124/68  Pulse: 69   the general appearance reveals a well-developed well-nourished woman in no distress.  His weight is 198 pounds, unchanged.The head and neck exam reveals pupils equal and reactive.  Extraocular movements are full.  There is no scleral icterus.  The mouth and pharynx are normal.  The neck is supple.  The carotids reveal no bruits.  The jugular venous pressure is normal.  The  thyroid is not enlarged.  There is no lymphadenopathy.  The chest is clear to percussion and auscultation.  There are no rales or rhonchi.  Expansion of the chest is symmetrical.  The precordium is quiet.  The first heart sound is normal.  The second heart sound is physiologically split.  There is no   gallop rub or click.  There is a soft systolic ejection murmur across the prosthetic aortic valve.  There is no diastolic murmur.  There is no abnormal lift or heave.  The abdomen is soft and nontender.  The bowel sounds are normal.  The liver and spleen are not enlarged.  There are no abdominal masses.  There are no abdominal bruits.  Extremities reveal good pedal pulses.  There is no  phlebitis or edema.  There is no cyanosis or clubbing.  Strength is normal and symmetrical in all extremities.  There is no lateralizing weakness.  There are no sensory deficits.  The skin is warm and dry.  There is no rash.     Assessment / Plan: The patient will continue same medication.  His PCP he is treating his back spasms.  He will return here in 4 months for office visit and EKG

## 2012-09-26 NOTE — Assessment & Plan Note (Signed)
Patient has a bovine aortic valve replacement and is doing well.  No symptoms of CHF or palpitations

## 2012-10-04 DIAGNOSIS — D509 Iron deficiency anemia, unspecified: Secondary | ICD-10-CM | POA: Diagnosis not present

## 2012-10-24 DIAGNOSIS — E119 Type 2 diabetes mellitus without complications: Secondary | ICD-10-CM | POA: Diagnosis not present

## 2012-10-24 DIAGNOSIS — M6740 Ganglion, unspecified site: Secondary | ICD-10-CM | POA: Diagnosis not present

## 2012-10-24 DIAGNOSIS — M898X9 Other specified disorders of bone, unspecified site: Secondary | ICD-10-CM | POA: Diagnosis not present

## 2012-11-15 DIAGNOSIS — I2510 Atherosclerotic heart disease of native coronary artery without angina pectoris: Secondary | ICD-10-CM | POA: Diagnosis not present

## 2012-11-15 DIAGNOSIS — Z6831 Body mass index (BMI) 31.0-31.9, adult: Secondary | ICD-10-CM | POA: Diagnosis not present

## 2012-11-15 DIAGNOSIS — D510 Vitamin B12 deficiency anemia due to intrinsic factor deficiency: Secondary | ICD-10-CM | POA: Diagnosis not present

## 2012-11-15 DIAGNOSIS — R5383 Other fatigue: Secondary | ICD-10-CM | POA: Diagnosis not present

## 2012-11-15 DIAGNOSIS — I10 Essential (primary) hypertension: Secondary | ICD-10-CM | POA: Diagnosis not present

## 2012-11-15 DIAGNOSIS — E1139 Type 2 diabetes mellitus with other diabetic ophthalmic complication: Secondary | ICD-10-CM | POA: Diagnosis not present

## 2012-11-15 DIAGNOSIS — R5381 Other malaise: Secondary | ICD-10-CM | POA: Diagnosis not present

## 2012-12-19 DIAGNOSIS — D510 Vitamin B12 deficiency anemia due to intrinsic factor deficiency: Secondary | ICD-10-CM | POA: Diagnosis not present

## 2013-01-14 ENCOUNTER — Other Ambulatory Visit: Payer: Self-pay | Admitting: *Deleted

## 2013-01-14 DIAGNOSIS — Z86010 Personal history of colonic polyps: Secondary | ICD-10-CM

## 2013-01-14 DIAGNOSIS — D509 Iron deficiency anemia, unspecified: Secondary | ICD-10-CM

## 2013-01-14 DIAGNOSIS — K2950 Unspecified chronic gastritis without bleeding: Secondary | ICD-10-CM

## 2013-01-14 MED ORDER — OMEPRAZOLE 40 MG PO CPDR: DELAYED_RELEASE_CAPSULE | ORAL | Status: DC

## 2013-01-15 DIAGNOSIS — S91309A Unspecified open wound, unspecified foot, initial encounter: Secondary | ICD-10-CM | POA: Diagnosis not present

## 2013-01-15 DIAGNOSIS — E1139 Type 2 diabetes mellitus with other diabetic ophthalmic complication: Secondary | ICD-10-CM | POA: Diagnosis not present

## 2013-01-15 DIAGNOSIS — Z6829 Body mass index (BMI) 29.0-29.9, adult: Secondary | ICD-10-CM | POA: Diagnosis not present

## 2013-01-17 ENCOUNTER — Encounter: Payer: Self-pay | Admitting: Cardiology

## 2013-01-17 ENCOUNTER — Ambulatory Visit (INDEPENDENT_AMBULATORY_CARE_PROVIDER_SITE_OTHER): Payer: Medicare Other | Admitting: Cardiology

## 2013-01-17 VITALS — BP 126/72 | HR 63 | Ht 67.0 in | Wt 196.8 lb

## 2013-01-17 DIAGNOSIS — I10 Essential (primary) hypertension: Secondary | ICD-10-CM

## 2013-01-17 DIAGNOSIS — I2510 Atherosclerotic heart disease of native coronary artery without angina pectoris: Secondary | ICD-10-CM | POA: Diagnosis not present

## 2013-01-17 DIAGNOSIS — Z954 Presence of other heart-valve replacement: Secondary | ICD-10-CM

## 2013-01-17 DIAGNOSIS — Z952 Presence of prosthetic heart valve: Secondary | ICD-10-CM

## 2013-01-17 NOTE — Progress Notes (Signed)
Nathan [name] Date of Birth:  12-16-1945 [facility] [address] [place], [place]  [address] [phone]         Fax   [phone]  History of Present Illness: This pleasant 67 year old gentleman is seen for a scheduled four-month followup office visit. He has a history of coronary artery bypass graft surgery and aortic valve replacement for severe aortic stenosis in April 2010. He has a tissue valve. His last echocardiogram 12/02/09 showed a normally functioning prosthetic aortic valve and showed normal LV systolic function with grade 1 diastolic dysfunction. The patient has a history of high blood pressure, diabetes, and hypercholesterolemia. Also has a history of nightmares and night terrors.  The patient continues to work in a gunshop.  He intends to work for another several years until his wife retires from her work.  Current Outpatient Prescriptions  Medication Sig Dispense Refill  . ALPRAZolam (XANAX) 0.25 MG tablet Take 0.25 mg by mouth as needed.        Marland Kitchen amLODipine (NORVASC) 5 MG tablet Take 5 mg by mouth daily.        Marland Kitchen aspirin 325 MG tablet Take 325 mg by mouth daily.        . B-D ULTRAFINE III SHORT PEN 31G X 8 MM MISC as directed.      . Cholecalciferol (VITAMIN D3) 1000 UNITS capsule Take 2,000 Units by mouth daily.       Marland Kitchen co-enzyme Q-10 30 MG capsule Take 100 mg by mouth 2 (two) times daily.      Marland Kitchen doxycycline (VIBRAMYCIN) 100 MG capsule Take 100 mg by mouth 2 (two) times daily. For 7 days      . fish oil-omega-3 fatty acids 1000 MG capsule Take 1 g by mouth daily.        Marland Kitchen glipiZIDE (GLUCOTROL) 5 MG tablet Take 5 mg by mouth daily.        . hydrochlorothiazide (MICROZIDE) 12.5 MG capsule Take 12.5 mg by mouth daily.      . hydrocodone-acetaminophen (LORCET-HD) 5-500 MG per capsule Take 1 capsule by mouth as needed.        . insulin glargine (LANTUS SOLOSTAR) 100 UNIT/ML injection Inject 16 Units into the skin every morning.       . latanoprost  (XALATAN) 0.005 % ophthalmic solution Place 1 drop into both eyes at bedtime.        Marland Kitchen loratadine-pseudoephedrine (CLARITIN-D 12-HOUR) 5-120 MG per tablet Take 1 tablet by mouth as needed.        . metFORMIN (GLUCOPHAGE) 1000 MG tablet Take 1,000 mg by mouth 2 (two) times daily with a meal.        . metoprolol (LOPRESSOR) 50 MG tablet Take by mouth 2 (two) times daily.        . minoxidil (LONITEN) 2.5 MG tablet Take 2.5 mg by mouth daily.        . Multiple Vitamin (MULTIVITAMIN) tablet Take 1 tablet by mouth daily.        Marland Kitchen olmesartan (BENICAR) 40 MG tablet Take 1 tablet (40 mg total) by mouth daily.  30 tablet  5  . omeprazole (PRILOSEC) 40 MG capsule Take one tablet by mouth twice a day  60 capsule  0  . rosuvastatin (CRESTOR) 5 MG tablet Take by mouth daily.        Marland Kitchen VITAMIN B1-B12 IM Inject into the muscle. monthly       No current facility-administered medications for this visit.  Allergies  Allergen Reactions  . Shellfish-Derived Products   . Zolpidem Tartrate Other (See Comments)    Hallucinations    Patient Active Problem List   Diagnosis Date Noted  . Benign hypertensive heart disease without heart failure 10/13/2010    Priority: High  . Hx of CABG 10/13/2010    Priority: Medium  . Other general symptoms  01/06/2011  . Personal history of colonic polyps 01/06/2011  . Iron deficiency anemia 01/06/2011  . Gastritis, chronic 01/06/2011  . Systolic hypertension   . Sinus bradycardia   . CAD (coronary artery disease)   . History of prosthetic aortic valve   . History of diabetic retinopathy   . History of necrotizing fascIItis   . Postoperative anemia   . Nightmares   . B12 DEFICIENCY 12/02/2009  . ESOPHAGEAL REFLUX 11/19/2009  . CHEST PAIN 11/19/2009  . BLOOD IN STOOL, OCCULT 11/19/2009  . ANEMIA-IRON DEFICIENCY 08/19/2008  . AORTIC STENOSIS 08/19/2008  . GI BLEED 08/19/2008  . DIABETES MELLITUS, TYPE II, MILD 08/14/2008  . DYSLIPIDEMIA 08/14/2008  . ANXIETY  08/14/2008  . OBSTRUCTIVE SLEEP APNEA 08/14/2008  . GLAUCOMA 08/14/2008  . HYPERTENSION 08/14/2008  . PHARYNGITIS 08/14/2008  . GASTRIC ULCER 08/14/2008  . GASTRITIS 08/14/2008  . DIVERTICULOSIS, COLON 08/14/2008    History  Smoking status  . Never Smoker   Smokeless tobacco  . Never Used    History  Alcohol Use  . Yes    Comment: rare    Family History  Problem Relation Age of Onset  . Cirrhosis Father   . Alcohol abuse Father   . Rectal cancer Mother   . Diabetes Mother   . Hypertension Sister   . Diabetes Sister     Review of Systems: Constitutional: no fever chills diaphoresis or fatigue or change in weight.  Head and neck: no hearing loss, no epistaxis, no photophobia or visual disturbance. Respiratory: No cough, shortness of breath or wheezing. Cardiovascular: No chest pain peripheral edema, palpitations. Gastrointestinal: No abdominal distention, no abdominal pain, no change in bowel habits hematochezia or melena. Genitourinary: No dysuria, no frequency, no urgency, no nocturia. Musculoskeletal:No arthralgias, no back pain, no gait disturbance or myalgias. Neurological: No dizziness, no headaches, no numbness, no seizures, no syncope, no weakness, no tremors. Hematologic: No lymphadenopathy, no easy bruising. Psychiatric: No confusion, no hallucinations, no sleep disturbance.    Physical Exam: Filed Vitals:   01/17/13 1040  BP: 126/72  Pulse: 63   the general appearance reveals a well-developed well-nourished gentleman in no distress.  His weight is down 2 pounds since last visit.The head and neck exam reveals pupils equal and reactive.  Extraocular movements are full.  There is no scleral icterus.  The mouth and pharynx are normal.  The neck is supple.  The carotids reveal no bruits.  The jugular venous pressure is normal.  The  thyroid is not enlarged.  There is no lymphadenopathy.  The chest is clear to percussion and auscultation.  There are no rales or  rhonchi.  Expansion of the chest is symmetrical.  The precordium is quiet.  The first heart sound is normal.  The second heart sound is physiologically split.  There is a soft systolic flow murmur across his prosthetic aortic valve.  No diastolic murmur.  There is no abnormal lift or heave.  The abdomen is soft and nontender.  The bowel sounds are normal.  The liver and spleen are not enlarged.  There are no abdominal masses.  There are  no abdominal bruits.  Extremities reveal good pedal pulses.  There is trace ankle edema. There is no cyanosis or clubbing.  Strength is normal and symmetrical in all extremities.  There is no lateralizing weakness.  There are no sensory deficits.  The skin is warm and dry.  There is no rash.  EKG today shows normal sinus rhythm and is within normal limits and is unchanged since 01/04/12.  Assessment / Plan: Continue same medication.  Recheck in 4 months for followup office visit.  Dr. Eloise Harman has been following his lipids.

## 2013-01-17 NOTE — Assessment & Plan Note (Signed)
Blood pressure was remaining stable on current therapy.  He has trace ankle edema attributable to amlodipine and is not a problem.

## 2013-01-17 NOTE — Assessment & Plan Note (Signed)
The patient does not do any intentional aerobic exercise but is physically active.  He does yard work.  He has not been having any symptoms of angina pectoris.

## 2013-01-17 NOTE — Patient Instructions (Addendum)
Your physician recommends that you continue on your current medications as directed. Please refer to the Current Medication list given to you today.  Your physician wants you to follow-up in: 4 month ov You will receive a reminder letter in the mail two months in advance. If you don't receive a letter, please call our office to schedule the follow-up appointment.  

## 2013-01-17 NOTE — Assessment & Plan Note (Signed)
The patient has not been experiencing any symptoms of congestive heart failure or palpitations.

## 2013-01-21 ENCOUNTER — Encounter: Payer: Self-pay | Admitting: *Deleted

## 2013-01-23 DIAGNOSIS — D510 Vitamin B12 deficiency anemia due to intrinsic factor deficiency: Secondary | ICD-10-CM | POA: Diagnosis not present

## 2013-01-24 DIAGNOSIS — Q6689 Other  specified congenital deformities of feet: Secondary | ICD-10-CM | POA: Diagnosis not present

## 2013-01-24 DIAGNOSIS — E119 Type 2 diabetes mellitus without complications: Secondary | ICD-10-CM | POA: Diagnosis not present

## 2013-01-24 DIAGNOSIS — M2010 Hallux valgus (acquired), unspecified foot: Secondary | ICD-10-CM | POA: Diagnosis not present

## 2013-01-31 ENCOUNTER — Encounter: Payer: Self-pay | Admitting: Gastroenterology

## 2013-01-31 ENCOUNTER — Ambulatory Visit (INDEPENDENT_AMBULATORY_CARE_PROVIDER_SITE_OTHER): Payer: Medicare Other | Admitting: Gastroenterology

## 2013-01-31 VITALS — BP 128/68 | HR 88 | Ht 65.25 in | Wt 197.0 lb

## 2013-01-31 DIAGNOSIS — K429 Umbilical hernia without obstruction or gangrene: Secondary | ICD-10-CM

## 2013-01-31 DIAGNOSIS — D509 Iron deficiency anemia, unspecified: Secondary | ICD-10-CM

## 2013-01-31 DIAGNOSIS — Z86010 Personal history of colonic polyps: Secondary | ICD-10-CM

## 2013-01-31 DIAGNOSIS — K2940 Chronic atrophic gastritis without bleeding: Secondary | ICD-10-CM | POA: Diagnosis not present

## 2013-01-31 DIAGNOSIS — K219 Gastro-esophageal reflux disease without esophagitis: Secondary | ICD-10-CM

## 2013-01-31 DIAGNOSIS — K2950 Unspecified chronic gastritis without bleeding: Secondary | ICD-10-CM

## 2013-01-31 MED ORDER — OMEPRAZOLE 40 MG PO CPDR: DELAYED_RELEASE_CAPSULE | ORAL | Status: DC

## 2013-01-31 NOTE — Progress Notes (Signed)
This is a 67 year old Caucasian male who suffers from insulin-dependent diabetes and atherosclerotic cardiovascular disease and aortic stenosis on multiple, multiple medications.  I'm treating him for acid reflux with Prilosec 40 mg twice a day.  He is up-to-date on his endoscopy and colonoscopy exams.  I previously seen him for iron deficiency anemia, he had some colon polyps removed colonoscopically, and endoscopy showed erosive gastroduodenitis it was felt to be the site of his GI blood loss..  The patient relates his lab data per Dr. Jarome Matin has been normal without evidence of iron deficiency, and followup Hemoccult cards been negative.  While on Prilosec, he denies any GI or general medical problems except for periumbilical abdominal pain which seems to be getting worse over the last year.  He has regular bowel movements without melena or hematochezia, denies a specific hepatobiliary complaints.  Current Medications, Allergies, Past Medical History, Past Surgical History, Family History and Social History were reviewed in [employer] record.  ROS: All systems were reviewed and are negative unless otherwise stated in the HPI.          Physical Exam: Blood pressure 120/68, pulse 88 and regular, and weight 197 with a BMI of 32.55.  Abdominal exam shows no organomegaly, but there is a fairly prominent umbilical hernia which is not easily reducible and is somewhat tender to touch but is not erythematous.  Bowel sounds are nonobstructive.  Mental status is normal.    Assessment and Plan: Acid reflux doing well on twice a day PPI therapy.  This patient is bothered by his umbilical hernia and wants to see a surgeon for consideration of surgical correction.  I've scheduled him to see Dr. Luretha Murphy.  Reflux regime reviewed in Prilosec refilled today.  Is not due for colonoscopy in May of 2016.Marland Kitchen  Previous endoscopy had shown evidence of NSAID erosive gastroduodenitis with  negative biopsies for H. pylori.  Is not on NSAIDs at this time.  He relates his recent hemoglobin A1c was 6.5.  He does take aspirin 325 mg a day.  Please copy this note to Dr. Jarome Matin and to Dr. Luretha Murphy Encounter Diagnoses  Name Primary?  . Gastritis, chronic Yes  . Iron deficiency anemia   . Personal history of colonic polyps   . Umbilical hernia

## 2013-01-31 NOTE — Patient Instructions (Addendum)
We have sent the following medications to your pharmacy for you to pick up at your convenience: Prilosec  You have been scheduled for an appointment with Dr Luretha Murphy at [facility]. Your appointment is on Friday, August 15th at 2:40 pm. Please arrive at 2:10 pm for registration. Make certain to bring a list of current medications, including any over the counter medications or vitamins. Also bring your co-pay if you have one as well as your insurance cards. [facility] is located at [address]. Should you need to reschedule your appointment, please contact them at [phone].  CC: Dr Jarome Matin, Dr Luretha Murphy

## 2013-02-21 DIAGNOSIS — D510 Vitamin B12 deficiency anemia due to intrinsic factor deficiency: Secondary | ICD-10-CM | POA: Diagnosis not present

## 2013-02-22 ENCOUNTER — Ambulatory Visit (INDEPENDENT_AMBULATORY_CARE_PROVIDER_SITE_OTHER): Payer: Medicare Other | Admitting: Surgery

## 2013-02-22 ENCOUNTER — Encounter (INDEPENDENT_AMBULATORY_CARE_PROVIDER_SITE_OTHER): Payer: Self-pay | Admitting: Surgery

## 2013-02-22 VITALS — BP 128/76 | HR 70 | Resp 14 | Ht 67.0 in | Wt 200.6 lb

## 2013-02-22 DIAGNOSIS — K429 Umbilical hernia without obstruction or gangrene: Secondary | ICD-10-CM

## 2013-02-22 NOTE — Progress Notes (Signed)
Chief Complaint:  Symptomatic umbilical hernia  History of Present Illness:  Nathan [name] is an 67 y.o. male He has had an increasing umbilical hernia this kind worse recently. He denies any prior surgery in that area. We discussed elective repair with mesh I gave him a booklet on this. He is aware of the risk of hernia recurrence. He asked about the likelihood of bowel getting strangulated and left colon the only way we could look at the contents of his present hernia would be to do a CT scan. 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  I think we will would schedule him for open umbilical hernia repair [name]. He's had a porcine aortic valve replaced and I will probably keep him overnight. He also had a significant history of necrotizing fasciitis and Darnell Level operated  on him so I got him to see him as well.  Past Medical History  Diagnosis Date  . Systolic hypertension   . Sinus bradycardia   . CAD (coronary artery disease)   . History of prosthetic aortic valve   . Aortic stenosis   . History of gastrointestinal bleeding   . Hypercholesteremia   . Diabetes mellitus, type 2   . History of diabetic retinopathy   . History of necrotizing fascIItis   . Postoperative anemia   . Nightmares     CHRONIC  . OSA (obstructive sleep apnea)     CPAP INTOLERANCE  . Vitamin B12 deficiency   . Diverticulosis of colon (without mention of hemorrhage)   . Personal history of colonic polyps 11/23/2009    TUBULAR ADENOMA  . Flesh-eating bacteria 1995  . Stomach ulcer     Hx of  . Blood transfusion without reported diagnosis   . Heart murmur     Past Surgical History  Procedure Laterality Date  . Retinal laser procedure    . Coronary artery bypass graft    . Scrotal surgery  1995    resection  . Hernia repair  1972  . Aortic valve replacement      Current Outpatient Prescriptions  Medication Sig Dispense Refill  . ALPRAZolam  (XANAX) 0.25 MG tablet Take 0.25 mg by mouth as needed.        Marland Kitchen amLODipine (NORVASC) 5 MG tablet Take 5 mg by mouth daily.        Marland Kitchen aspirin 325 MG tablet Take 325 mg by mouth daily.        Marland Kitchen atorvastatin (LIPITOR) 20 MG tablet       . B-D ULTRAFINE III SHORT PEN 31G X 8 MM MISC as directed.      . Cholecalciferol (VITAMIN D3) 1000 UNITS capsule Take 2,000 Units by mouth daily.       Marland Kitchen co-enzyme Q-10 30 MG capsule Take 100 mg by mouth 2 (two) times daily.      Marland Kitchen doxycycline (VIBRAMYCIN) 100 MG capsule Take 100 mg by mouth 2 (two) times daily. For 7 days      . fish oil-omega-3 fatty acids 1000 MG capsule Take 1 g by mouth daily.        Marland Kitchen glipiZIDE (GLUCOTROL) 5 MG tablet Take 5 mg by mouth daily.        . hydrochlorothiazide (MICROZIDE) 12.5 MG capsule Take 12.5 mg by mouth daily.      . hydrocodone-acetaminophen (LORCET-HD) 5-500 MG per capsule Take 1 capsule by mouth as needed.        Marland Kitchen  insulin glargine (LANTUS SOLOSTAR) 100 UNIT/ML injection Inject 16 Units into the skin every morning.       . latanoprost (XALATAN) 0.005 % ophthalmic solution Place 1 drop into both eyes at bedtime.        Marland Kitchen loratadine-pseudoephedrine (CLARITIN-D 12-HOUR) 5-120 MG per tablet Take 1 tablet by mouth as needed.        . metFORMIN (GLUCOPHAGE) 1000 MG tablet Take 1,000 mg by mouth 2 (two) times daily with a meal.        . metoprolol (LOPRESSOR) 50 MG tablet Take by mouth 2 (two) times daily.        . minoxidil (LONITEN) 2.5 MG tablet Take 2.5 mg by mouth daily.        . Multiple Vitamin (MULTIVITAMIN) tablet Take 1 tablet by mouth daily.        Marland Kitchen olmesartan (BENICAR) 40 MG tablet Take 1 tablet (40 mg total) by mouth daily.  30 tablet  5  . omeprazole (PRILOSEC) 40 MG capsule Take one tablet by mouth twice a day  60 capsule  3  . rosuvastatin (CRESTOR) 5 MG tablet Take by mouth daily.        Marland Kitchen VITAMIN B1-B12 IM Inject into the muscle. monthly       No current facility-administered medications for this visit.    Shellfish-derived products and Zolpidem tartrate Family History  Problem Relation Age of Onset  . Cirrhosis Father   . Alcohol abuse Father   . Rectal cancer Mother   . Diabetes Mother   . Hypertension Sister   . Diabetes Sister    Social History:   reports that he has never smoked. He has never used smokeless tobacco. He reports that  drinks alcohol. He reports that he does not use illicit drugs.   REVIEW OF SYSTEMS - PERTINENT POSITIVES ONLY: Negative for CNS, + heard valve but not on anticoagulants, neg ashtma,   Physical Exam:   Blood pressure 128/76, pulse 70, resp. rate 14, height 5\' 7"  (1.702 m), weight 200 lb 9.6 oz (90.992 kg). Body mass index is 31.41 kg/(m^2).  Gen:  WDWN WM NAD  Neurological: Alert and oriented to person, place, and time. Motor and sensory function is grossly intact  Head: Normocephalic and atraumatic.  Eyes: Conjunctivae are normal. Pupils are equal, round, and reactive to light. No scleral icterus.  Neck: Normal range of motion. Neck supple. No tracheal deviation or thyromegaly present.  Cardiovascular:  SR 2/6 SEM   No carotid bruits Respiratory: Effort normal.  No respiratory distress. No chest wall tenderness. Breath sounds normal.  No wheezes, rales or rhonchi.  Abdomen:  Protuberant  Umbilical hernia easily reducible GU: Musculoskeletal: Normal range of motion. Extremities are nontender. No cyanosis, edema or clubbing noted Lymphadenopathy: No cervical, preauricular, postauricular or axillary adenopathy is present Skin: Skin is warm and dry. No rash noted. No diaphoresis. No erythema. No pallor. Pscyh: Normal mood and affect. Behavior is normal. Judgment and thought content normal.   LABORATORY RESULTS: No results found for this or any previous visit (from the past 48 hour(s)).  RADIOLOGY RESULTS: No results found.  Problem List: Patient Active Problem List   Diagnosis Date Noted  . Other general symptoms  01/06/2011  . Personal  history of colonic polyps 01/06/2011  . Iron deficiency anemia 01/06/2011  . Gastritis, chronic 01/06/2011  . Benign hypertensive heart disease without heart failure 10/13/2010  . Hx of CABG 10/13/2010  . Systolic hypertension   . Sinus bradycardia   .  CAD (coronary artery disease)   . History of prosthetic aortic valve   . History of diabetic retinopathy   . History of necrotizing fascIItis   . Postoperative anemia   . Nightmares   . B12 DEFICIENCY 12/02/2009  . ESOPHAGEAL REFLUX 11/19/2009  . CHEST PAIN 11/19/2009  . BLOOD IN STOOL, OCCULT 11/19/2009  . ANEMIA-IRON DEFICIENCY 08/19/2008  . AORTIC STENOSIS 08/19/2008  . GI BLEED 08/19/2008  . DIABETES MELLITUS, TYPE II, MILD 08/14/2008  . DYSLIPIDEMIA 08/14/2008  . ANXIETY 08/14/2008  . OBSTRUCTIVE SLEEP APNEA 08/14/2008  . GLAUCOMA 08/14/2008  . HYPERTENSION 08/14/2008  . PHARYNGITIS 08/14/2008  . GASTRIC ULCER 08/14/2008  . GASTRITIS 08/14/2008  . DIVERTICULOSIS, COLON 08/14/2008    Assessment & Plan: Symptomatic umbilical hernia Open repair with mesh    Matt B. Daphine Deutscher, MD, [facility] [phone] beeper [phone]  02/22/2013 4:10 PM

## 2013-02-22 NOTE — Patient Instructions (Signed)
Ventral Hernia A ventral hernia (also called an incisional hernia) is a hernia that occurs at the site of a previous surgical cut (incision) in the abdomen. The abdominal wall spans from your lower chest down to your pelvis. If the abdominal wall is weakened from a surgical incision, a hernia can occur. A hernia is a bulge of bowel or muscle tissue pushing out on the weakened part of the abdominal wall. Ventral hernias can get bigger from straining or lifting. Obese and older people are at higher risk for a ventral hernia. People who develop infections after surgery or require repeat incisions at the same site on the abdomen are also at increased risk. CAUSES  A ventral hernia occurs because of weakness in the abdominal wall at an incision site.  SYMPTOMS  Common symptoms include:  A visible bulge or lump on the abdominal wall.  Pain or tenderness around the lump.  Increased discomfort if you cough or make a sudden movement. If the hernia has blocked part of the intestine, a serious complication can occur (incarcerated or strangulated hernia). This can become a problem that requires emergency surgery because the blood flow to the blocked intestine may be cut off. Symptoms may include:  Feeling sick to your stomach (nauseous).  Throwing up (vomiting).  Stomach swelling (distention) or bloating.  Fever.  Rapid heartbeat. DIAGNOSIS  Your caregiver will take a medical history and perform a physical exam. Various tests may be ordered, such as:  Blood tests.  Urine tests.  Ultrasonography.  X-rays.  Computed tomography (CT). TREATMENT  Watchful waiting may be all that is needed for a smaller hernia that does not cause symptoms. Your caregiver may recommend the use of a supportive belt (truss) that helps to keep the abdominal wall intact. For larger hernias or those that cause pain, surgery to repair the hernia is usually recommended. If a hernia becomes strangulated, emergency surgery  needs to be done right away. HOME CARE INSTRUCTIONS  Avoid putting pressure or strain on the abdominal area.  Avoid heavy lifting.  Use good body positioning for physical tasks. Ask your caregiver about proper body positioning.  Use a supportive belt as directed by your caregiver.  Maintain a healthy weight.  Eat foods that are high in fiber, such as whole grains, fruits, and vegetables. Fiber helps prevent difficult bowel movements (constipation).  Drink enough fluids to keep your urine clear or pale yellow.  Follow up with your caregiver as directed. SEEK MEDICAL CARE IF:   Your hernia seems to be getting larger or more painful. SEEK IMMEDIATE MEDICAL CARE IF:   You have abdominal pain that is sudden and sharp.  Your pain becomes severe.  You have repeated vomiting.  You are sweating a lot.  You notice a rapid heartbeat.  You develop a fever. MAKE SURE YOU:   Understand these instructions.  Will watch your condition.  Will get help right away if you are not doing well or get worse. Document Released: 06/13/2012 Document Reviewed: 06/01/2012 ExitCare Patient Information 2014 [employer].  

## 2013-03-07 DIAGNOSIS — Q6689 Other  specified congenital deformities of feet: Secondary | ICD-10-CM | POA: Diagnosis not present

## 2013-03-28 DIAGNOSIS — D510 Vitamin B12 deficiency anemia due to intrinsic factor deficiency: Secondary | ICD-10-CM | POA: Diagnosis not present

## 2013-04-01 ENCOUNTER — Encounter (HOSPITAL_COMMUNITY): Payer: Self-pay | Admitting: Pharmacy Technician

## 2013-04-02 NOTE — Patient Instructions (Signed)
Nathan [name]  04/02/2013   Your procedure is scheduled on:  04/10/2013               Surgery 0830am-0940am  Report to [facility] at     0630 AM.  Call this number if you have problems the morning of surgery: [phone]   Remember:  Fleets enema nite before surgery.              Eat a good healthy snack prior to bedtime.     Do not eat food or drink liquids after midnight.   Take these medicines the morning of surgery with A SIP OF WATER:    Do not wear jewelry,   Do not wear lotions, powders, or perfumes.   . Men may shave face and neck.  Do not bring valuables to the hospital.  Contacts, dentures or bridgework may not be worn into surgery.  Leave suitcase in the car. After surgery it may be brought to your room.  For patients admitted to the hospital, checkout time is 11:00 AM the day of  discharge.     SEE CHG INSTRUCTION SHEET    Please read over the following fact sheets that you were given: , coughing and deep breathing exercises, leg exercises               Failure to comply with these instructions may result in cancellation of your surgery.                Patient Signature ____________________________              Nurse Signature _____________________________

## 2013-04-03 ENCOUNTER — Telehealth (INDEPENDENT_AMBULATORY_CARE_PROVIDER_SITE_OTHER): Payer: Self-pay | Admitting: General Surgery

## 2013-04-03 ENCOUNTER — Encounter (HOSPITAL_COMMUNITY)
Admission: RE | Admit: 2013-04-03 | Discharge: 2013-04-03 | Disposition: A | Discharge status: Home / Self Care | Payer: Medicare Other | Source: Ambulatory Visit | Attending: Surgery | Admitting: Surgery

## 2013-04-03 ENCOUNTER — Ambulatory Visit (HOSPITAL_COMMUNITY)
Admission: RE | Admit: 2013-04-03 | Discharge: 2013-04-03 | Disposition: A | Discharge status: Home / Self Care | Payer: Medicare Other | Source: Ambulatory Visit | Attending: Surgery | Admitting: Surgery

## 2013-04-03 ENCOUNTER — Encounter (HOSPITAL_COMMUNITY): Payer: Self-pay

## 2013-04-03 DIAGNOSIS — Z951 Presence of aortocoronary bypass graft: Secondary | ICD-10-CM | POA: Diagnosis not present

## 2013-04-03 DIAGNOSIS — I517 Cardiomegaly: Secondary | ICD-10-CM | POA: Diagnosis not present

## 2013-04-03 DIAGNOSIS — I10 Essential (primary) hypertension: Secondary | ICD-10-CM | POA: Diagnosis not present

## 2013-04-03 DIAGNOSIS — Z01818 Encounter for other preprocedural examination: Secondary | ICD-10-CM | POA: Diagnosis not present

## 2013-04-03 DIAGNOSIS — K429 Umbilical hernia without obstruction or gangrene: Secondary | ICD-10-CM | POA: Diagnosis not present

## 2013-04-03 DIAGNOSIS — Z01812 Encounter for preprocedural laboratory examination: Secondary | ICD-10-CM | POA: Insufficient documentation

## 2013-04-03 DIAGNOSIS — Z952 Presence of prosthetic heart valve: Secondary | ICD-10-CM | POA: Insufficient documentation

## 2013-04-03 HISTORY — DX: Gastro-esophageal reflux disease without esophagitis: K21.9

## 2013-04-03 HISTORY — DX: Unspecified osteoarthritis, unspecified site: M19.90

## 2013-04-03 LAB — CBC
HCT: 37.3 % — ABNORMAL LOW (ref 39.0–52.0)
MCH: 26.8 pg (ref 26.0–34.0)
MCV: 79.9 fL (ref 78.0–100.0)
RBC: 4.67 MIL/uL (ref 4.22–5.81)
WBC: 7.8 10*3/uL (ref 4.0–10.5)

## 2013-04-03 LAB — BASIC METABOLIC PANEL
BUN: 18 mg/dL (ref 6–23)
CO2: 24 mEq/L (ref 19–32)
Calcium: 9.8 mg/dL (ref 8.4–10.5)
Chloride: 100 mEq/L (ref 96–112)
Creatinine, Ser: 1.01 mg/dL (ref 0.50–1.35)
Sodium: 138 mEq/L (ref 135–145)

## 2013-04-03 NOTE — Progress Notes (Addendum)
EKG 01/17/13 EPIC  Stress Test 10/2010 EPIC  ECHO 2011 EPIC  Dr Wylene Simmer Last office visit note 01/17/13 EPIC

## 2013-04-03 NOTE — Telephone Encounter (Signed)
Patient called this morning and stated that he went to the pre op this morning at [facility] and he told the nurse that he is getting a head cold, so the nurse over at pre op told him to call us to let us know that he has a head cold. He has upcoming surgery on 04-10-2013 with Dr Daphine Deutscher. I told the patient that he will need to see his PCP and he stated that he has an apt tomorrow at 4 o'clock. And I told the patient that I want him to call back to our office to let us know what or if his PCP Dr put him on any antibodies or type of med that may delay his surgery and the patient understand that he will call back to our office to give [place] any information

## 2013-04-04 DIAGNOSIS — I2510 Atherosclerotic heart disease of native coronary artery without angina pectoris: Secondary | ICD-10-CM | POA: Diagnosis not present

## 2013-04-04 DIAGNOSIS — Z6829 Body mass index (BMI) 29.0-29.9, adult: Secondary | ICD-10-CM | POA: Diagnosis not present

## 2013-04-04 DIAGNOSIS — J310 Chronic rhinitis: Secondary | ICD-10-CM | POA: Diagnosis not present

## 2013-04-04 DIAGNOSIS — I10 Essential (primary) hypertension: Secondary | ICD-10-CM | POA: Diagnosis not present

## 2013-04-04 DIAGNOSIS — E1139 Type 2 diabetes mellitus with other diabetic ophthalmic complication: Secondary | ICD-10-CM | POA: Diagnosis not present

## 2013-04-10 ENCOUNTER — Observation Stay (HOSPITAL_COMMUNITY)
Admission: RE | Admit: 2013-04-10 | Discharge: 2013-04-11 | Disposition: A | Discharge status: Home / Self Care | Payer: Medicare Other | Source: Ambulatory Visit | Attending: Surgery | Admitting: Surgery

## 2013-04-10 ENCOUNTER — Ambulatory Visit (HOSPITAL_COMMUNITY): Payer: Medicare Other | Admitting: Anesthesiology

## 2013-04-10 ENCOUNTER — Encounter (HOSPITAL_COMMUNITY): Payer: Self-pay | Admitting: Anesthesiology

## 2013-04-10 ENCOUNTER — Encounter (HOSPITAL_COMMUNITY): Payer: Self-pay | Admitting: *Deleted

## 2013-04-10 ENCOUNTER — Encounter (HOSPITAL_COMMUNITY)
Admission: RE | Disposition: A | Discharge status: Home / Self Care | Payer: Self-pay | Source: Ambulatory Visit | Attending: Surgery

## 2013-04-10 DIAGNOSIS — G4733 Obstructive sleep apnea (adult) (pediatric): Secondary | ICD-10-CM | POA: Diagnosis not present

## 2013-04-10 DIAGNOSIS — Z8719 Personal history of other diseases of the digestive system: Secondary | ICD-10-CM

## 2013-04-10 DIAGNOSIS — E119 Type 2 diabetes mellitus without complications: Secondary | ICD-10-CM | POA: Diagnosis not present

## 2013-04-10 DIAGNOSIS — Z79899 Other long term (current) drug therapy: Secondary | ICD-10-CM | POA: Diagnosis not present

## 2013-04-10 DIAGNOSIS — Z794 Long term (current) use of insulin: Secondary | ICD-10-CM | POA: Diagnosis not present

## 2013-04-10 DIAGNOSIS — E7800 Pure hypercholesterolemia, unspecified: Secondary | ICD-10-CM | POA: Insufficient documentation

## 2013-04-10 DIAGNOSIS — I10 Essential (primary) hypertension: Secondary | ICD-10-CM | POA: Diagnosis not present

## 2013-04-10 DIAGNOSIS — K429 Umbilical hernia without obstruction or gangrene: Principal | ICD-10-CM | POA: Insufficient documentation

## 2013-04-10 DIAGNOSIS — Z954 Presence of other heart-valve replacement: Secondary | ICD-10-CM | POA: Insufficient documentation

## 2013-04-10 DIAGNOSIS — K219 Gastro-esophageal reflux disease without esophagitis: Secondary | ICD-10-CM | POA: Diagnosis not present

## 2013-04-10 DIAGNOSIS — I2510 Atherosclerotic heart disease of native coronary artery without angina pectoris: Secondary | ICD-10-CM | POA: Diagnosis not present

## 2013-04-10 DIAGNOSIS — Z951 Presence of aortocoronary bypass graft: Secondary | ICD-10-CM | POA: Diagnosis not present

## 2013-04-10 DIAGNOSIS — Z23 Encounter for immunization: Secondary | ICD-10-CM | POA: Insufficient documentation

## 2013-04-10 HISTORY — PX: UMBILICAL HERNIA REPAIR: SHX196

## 2013-04-10 LAB — CBC
HCT: 34.9 % — ABNORMAL LOW (ref 39.0–52.0)
Hemoglobin: 11.8 g/dL — ABNORMAL LOW (ref 13.0–17.0)
MCH: 26.9 pg (ref 26.0–34.0)
MCHC: 33.8 g/dL (ref 30.0–36.0)
MCV: 79.5 fL (ref 78.0–100.0)
RBC: 4.39 MIL/uL (ref 4.22–5.81)

## 2013-04-10 LAB — GLUCOSE, CAPILLARY
Glucose-Capillary: 103 mg/dL — ABNORMAL HIGH (ref 70–99)
Glucose-Capillary: 194 mg/dL — ABNORMAL HIGH (ref 70–99)
Glucose-Capillary: 161 mg/dL — ABNORMAL HIGH (ref 70–99)

## 2013-04-10 LAB — HEMOGLOBIN A1C
Hgb A1c MFr Bld: 6.9 % — ABNORMAL HIGH (ref <5.7)
Mean Plasma Glucose: 151 mg/dL — ABNORMAL HIGH (ref <117)

## 2013-04-10 LAB — CREATININE, SERUM: GFR calc non Af Amer: 72 mL/min — ABNORMAL LOW (ref >90)

## 2013-04-10 SURGERY — REPAIR, HERNIA, UMBILICAL, ADULT
Anesthesia: General | Wound class: Clean

## 2013-04-10 MED ORDER — OXYCODONE-ACETAMINOPHEN 5-325 MG PO TABS
1.0000 | ORAL_TABLET | ORAL | Status: DC | PRN
  Administered 2013-04-10: 2 via ORAL
  Filled 2013-04-10: qty 2

## 2013-04-10 MED ORDER — IRBESARTAN 150 MG PO TABS
150.0000 mg | ORAL_TABLET | Freq: Every day | ORAL | Status: DC
  Administered 2013-04-10: 150 mg via ORAL
  Filled 2013-04-10 (×2): qty 1

## 2013-04-10 MED ORDER — ACETAMINOPHEN 500 MG PO TABS
1000.0000 mg | ORAL_TABLET | Freq: Four times a day (QID) | ORAL | Status: AC
  Administered 2013-04-10 – 2013-04-11 (×4): 1000 mg via ORAL
  Filled 2013-04-10 (×4): qty 2

## 2013-04-10 MED ORDER — PHENYLEPHRINE HCL 10 MG/ML IJ SOLN
INTRAMUSCULAR | Status: DC | PRN
  Administered 2013-04-10: 40 ug via INTRAVENOUS

## 2013-04-10 MED ORDER — MIDAZOLAM HCL 5 MG/5ML IJ SOLN
INTRAMUSCULAR | Status: DC | PRN
  Administered 2013-04-10: 2 mg via INTRAVENOUS

## 2013-04-10 MED ORDER — AMLODIPINE BESYLATE 5 MG PO TABS
5.0000 mg | ORAL_TABLET | Freq: Every morning | ORAL | Status: DC
  Filled 2013-04-10: qty 1

## 2013-04-10 MED ORDER — CHLORHEXIDINE GLUCONATE 4 % EX LIQD
1.0000 "application " | Freq: Once | CUTANEOUS | Status: DC
  Filled 2013-04-10: qty 15

## 2013-04-10 MED ORDER — GLYCOPYRROLATE 0.2 MG/ML IJ SOLN
INTRAMUSCULAR | Status: DC | PRN
  Administered 2013-04-10: .8 mg via INTRAVENOUS

## 2013-04-10 MED ORDER — CEFAZOLIN SODIUM-DEXTROSE 2-3 GM-% IV SOLR
INTRAVENOUS | Status: AC
  Filled 2013-04-10: qty 50

## 2013-04-10 MED ORDER — METOPROLOL TARTRATE 50 MG PO TABS
50.0000 mg | ORAL_TABLET | Freq: Two times a day (BID) | ORAL | Status: DC
  Filled 2013-04-10 (×3): qty 1

## 2013-04-10 MED ORDER — POTASSIUM CHLORIDE IN NACL 20-0.45 MEQ/L-% IV SOLN
INTRAVENOUS | Status: DC
  Administered 2013-04-10: 50 mL via INTRAVENOUS
  Administered 2013-04-11: 07:00:00 via INTRAVENOUS
  Filled 2013-04-10 (×4): qty 1000

## 2013-04-10 MED ORDER — PROPOFOL 10 MG/ML IV BOLUS
INTRAVENOUS | Status: DC | PRN
  Administered 2013-04-10: 150 mg via INTRAVENOUS

## 2013-04-10 MED ORDER — FLEET ENEMA 7-19 GM/118ML RE ENEM: 1.0000 | ENEMA | Freq: Once | RECTAL | Status: DC

## 2013-04-10 MED ORDER — PROMETHAZINE HCL 25 MG/ML IJ SOLN: 6.2500 mg | INTRAMUSCULAR | Status: DC | PRN

## 2013-04-10 MED ORDER — INSULIN GLARGINE 100 UNIT/ML ~~LOC~~ SOLN
10.0000 [IU] | Freq: Every day | SUBCUTANEOUS | Status: DC
  Administered 2013-04-10: 10 [IU] via SUBCUTANEOUS
  Filled 2013-04-10: qty 0.1

## 2013-04-10 MED ORDER — HYDROMORPHONE HCL PF 1 MG/ML IJ SOLN: 0.2500 mg | INTRAMUSCULAR | Status: DC | PRN

## 2013-04-10 MED ORDER — LACTATED RINGERS IV SOLN
INTRAVENOUS | Status: DC | PRN
  Administered 2013-04-10: 08:00:00 via INTRAVENOUS

## 2013-04-10 MED ORDER — LIDOCAINE HCL (CARDIAC) 20 MG/ML IV SOLN
INTRAVENOUS | Status: DC | PRN
  Administered 2013-04-10: 50 mg via INTRAVENOUS

## 2013-04-10 MED ORDER — FENTANYL CITRATE 0.05 MG/ML IJ SOLN
INTRAMUSCULAR | Status: DC | PRN
  Administered 2013-04-10: 50 ug via INTRAVENOUS
  Administered 2013-04-10: 100 ug via INTRAVENOUS
  Administered 2013-04-10: 50 ug via INTRAVENOUS

## 2013-04-10 MED ORDER — 0.9 % SODIUM CHLORIDE (POUR BTL) OPTIME
TOPICAL | Status: DC | PRN
  Administered 2013-04-10: 100 mL

## 2013-04-10 MED ORDER — ONDANSETRON HCL 4 MG/2ML IJ SOLN
INTRAMUSCULAR | Status: DC | PRN
  Administered 2013-04-10: 4 mg via INTRAVENOUS

## 2013-04-10 MED ORDER — OXYCODONE HCL 5 MG/5ML PO SOLN
5.0000 mg | Freq: Once | ORAL | Status: DC | PRN
  Filled 2013-04-10: qty 5

## 2013-04-10 MED ORDER — HEPARIN SODIUM (PORCINE) 5000 UNIT/ML IJ SOLN
5000.0000 [IU] | Freq: Three times a day (TID) | INTRAMUSCULAR | Status: DC
  Administered 2013-04-10 – 2013-04-11 (×2): 5000 [IU] via SUBCUTANEOUS
  Filled 2013-04-10 (×5): qty 1

## 2013-04-10 MED ORDER — INFLUENZA VAC SPLIT QUAD 0.5 ML IM SUSP
0.5000 mL | INTRAMUSCULAR | Status: AC
  Administered 2013-04-11: 0.5 mL via INTRAMUSCULAR
  Filled 2013-04-10 (×2): qty 0.5

## 2013-04-10 MED ORDER — SUCCINYLCHOLINE CHLORIDE 20 MG/ML IJ SOLN
INTRAMUSCULAR | Status: DC | PRN
  Administered 2013-04-10: 100 mg via INTRAVENOUS

## 2013-04-10 MED ORDER — HEPARIN SODIUM (PORCINE) 5000 UNIT/ML IJ SOLN
5000.0000 [IU] | Freq: Once | INTRAMUSCULAR | Status: AC
  Administered 2013-04-10: 5000 [IU] via SUBCUTANEOUS
  Filled 2013-04-10: qty 1

## 2013-04-10 MED ORDER — OXYCODONE HCL 5 MG PO TABS: 5.0000 mg | ORAL_TABLET | Freq: Once | ORAL | Status: DC | PRN

## 2013-04-10 MED ORDER — LACTATED RINGERS IV SOLN: INTRAVENOUS | Status: DC

## 2013-04-10 MED ORDER — ASPIRIN 325 MG PO TABS
325.0000 mg | ORAL_TABLET | Freq: Every day | ORAL | Status: DC
Start: 2013-04-10 — End: 2013-04-11
  Administered 2013-04-10 – 2013-04-11 (×2): 325 mg via ORAL
  Filled 2013-04-10 (×2): qty 1

## 2013-04-10 MED ORDER — INSULIN ASPART 100 UNIT/ML ~~LOC~~ SOLN
0.0000 [IU] | SUBCUTANEOUS | Status: DC
  Administered 2013-04-10: 3 [IU] via SUBCUTANEOUS
  Administered 2013-04-10: 2 [IU] via SUBCUTANEOUS
  Administered 2013-04-10: 3 [IU] via SUBCUTANEOUS

## 2013-04-10 MED ORDER — NEOSTIGMINE METHYLSULFATE 1 MG/ML IJ SOLN
INTRAMUSCULAR | Status: DC | PRN
  Administered 2013-04-10: 4 mg via INTRAVENOUS

## 2013-04-10 MED ORDER — MEPERIDINE HCL 50 MG/ML IJ SOLN: 6.2500 mg | INTRAMUSCULAR | Status: DC | PRN

## 2013-04-10 MED ORDER — MORPHINE SULFATE 2 MG/ML IJ SOLN
1.0000 mg | INTRAMUSCULAR | Status: DC | PRN
  Administered 2013-04-10 – 2013-04-11 (×2): 1 mg via INTRAVENOUS
  Filled 2013-04-10 (×2): qty 1

## 2013-04-10 MED ORDER — PANTOPRAZOLE SODIUM 40 MG PO TBEC
40.0000 mg | DELAYED_RELEASE_TABLET | Freq: Every day | ORAL | Status: DC
  Filled 2013-04-10: qty 1

## 2013-04-10 MED ORDER — CEFAZOLIN SODIUM 1-5 GM-% IV SOLN
1.0000 g | Freq: Three times a day (TID) | INTRAVENOUS | Status: AC
  Administered 2013-04-10: 1 g via INTRAVENOUS
  Filled 2013-04-10: qty 50

## 2013-04-10 MED ORDER — BUPIVACAINE-EPINEPHRINE 0.25% -1:200000 IJ SOLN
INTRAMUSCULAR | Status: DC | PRN
  Administered 2013-04-10: 10 mL

## 2013-04-10 MED ORDER — LATANOPROST 0.005 % OP SOLN
1.0000 [drp] | Freq: Every day | OPHTHALMIC | Status: DC
  Administered 2013-04-10: 1 [drp] via OPHTHALMIC
  Filled 2013-04-10: qty 2.5

## 2013-04-10 MED ORDER — ROCURONIUM BROMIDE 100 MG/10ML IV SOLN
INTRAVENOUS | Status: DC | PRN
  Administered 2013-04-10: 40 mg via INTRAVENOUS

## 2013-04-10 MED ORDER — BUPIVACAINE-EPINEPHRINE PF 0.25-1:200000 % IJ SOLN
INTRAMUSCULAR | Status: AC
  Filled 2013-04-10: qty 30

## 2013-04-10 MED ORDER — CEFAZOLIN SODIUM-DEXTROSE 2-3 GM-% IV SOLR
2.0000 g | INTRAVENOUS | Status: AC
  Administered 2013-04-10: 2 g via INTRAVENOUS

## 2013-04-10 MED ORDER — HYDROCHLOROTHIAZIDE 12.5 MG PO CAPS
12.5000 mg | ORAL_CAPSULE | Freq: Every day | ORAL | Status: DC
  Administered 2013-04-10: 12.5 mg via ORAL
  Filled 2013-04-10 (×2): qty 1

## 2013-04-10 SURGICAL SUPPLY — 38 items
APL SKNCLS STERI-STRIP NONHPOA (GAUZE/BANDAGES/DRESSINGS) ×1
BALL CTTN STRL GZE (GAUZE/BANDAGES/DRESSINGS) ×1
BENZOIN TINCTURE PRP APPL 2/3 (GAUZE/BANDAGES/DRESSINGS) ×2 IMPLANT
BLADE HEX COATED 2.75 (ELECTRODE) ×2 IMPLANT
BLADE SURG 15 STRL LF DISP TIS (BLADE) ×1 IMPLANT
BLADE SURG 15 STRL SS (BLADE) ×2
BLADE SURG SZ10 CARB STEEL (BLADE) ×2 IMPLANT
CLOTH BEACON ORANGE TIMEOUT ST (SAFETY) ×2 IMPLANT
COTTON BALL STERILE (GAUZE/BANDAGES/DRESSINGS) ×2
COTTON BALL STERILE 2 PK (GAUZE/BANDAGES/DRESSINGS) IMPLANT
DECANTER SPIKE VIAL GLASS SM (MISCELLANEOUS) ×1 IMPLANT
DRAPE LAPAROTOMY T 102X78X121 (DRAPES) ×2 IMPLANT
ELECT REM PT RETURN 9FT ADLT (ELECTROSURGICAL) ×2
ELECTRODE REM PT RTRN 9FT ADLT (ELECTROSURGICAL) ×1 IMPLANT
GAUZE SPONGE 2X2 8PLY STRL LF (GAUZE/BANDAGES/DRESSINGS) IMPLANT
GLOVE BIOGEL M 8.0 STRL (GLOVE) ×2 IMPLANT
GLOVE BIOGEL PI IND STRL 7.0 (GLOVE) ×1 IMPLANT
GLOVE BIOGEL PI INDICATOR 7.0 (GLOVE) ×1
GOWN PREVENTION PLUS LG XLONG (DISPOSABLE) ×2 IMPLANT
GOWN STRL REIN XL XLG (GOWN DISPOSABLE) ×4 IMPLANT
KIT BASIN OR (CUSTOM PROCEDURE TRAY) ×2 IMPLANT
MARKER SKIN DUAL TIP RULER LAB (MISCELLANEOUS) ×2 IMPLANT
MESH VENTRALEX ST 1-7/10 CRC S (Mesh General) ×1 IMPLANT
NDL HYPO 25X1 1.5 SAFETY (NEEDLE) ×1 IMPLANT
NEEDLE HYPO 22GX1.5 SAFETY (NEEDLE) ×2 IMPLANT
NEEDLE HYPO 25X1 1.5 SAFETY (NEEDLE) ×2 IMPLANT
NS IRRIG 1000ML POUR BTL (IV SOLUTION) ×2 IMPLANT
PACK BASIC VI WITH GOWN DISP (CUSTOM PROCEDURE TRAY) ×2 IMPLANT
PENCIL BUTTON HOLSTER BLD 10FT (ELECTRODE) ×2 IMPLANT
SPONGE GAUZE 2X2 STER 10/PKG (GAUZE/BANDAGES/DRESSINGS) ×1
SPONGE GAUZE 4X4 12PLY (GAUZE/BANDAGES/DRESSINGS) ×2 IMPLANT
SPONGE LAP 4X18 X RAY DECT (DISPOSABLE) ×4 IMPLANT
STRIP CLOSURE SKIN 1/2X4 (GAUZE/BANDAGES/DRESSINGS) ×2 IMPLANT
SUT NOVA NAB GS-21 0 18 T12 DT (SUTURE) ×1 IMPLANT
SUT PROLENE 0 CT 1 CR/8 (SUTURE) IMPLANT
SUT PROLENE 0 CT 2 (SUTURE) IMPLANT
SUT VIC AB 4-0 SH 18 (SUTURE) ×2 IMPLANT
SYR CONTROL 10ML LL (SYRINGE) ×2 IMPLANT

## 2013-04-10 NOTE — Transfer of Care (Signed)
Immediate Anesthesia Transfer of Care Note  Patient: Nathan [name]  Procedure(s) Performed: Procedure(s) (LRB): HERNIA REPAIR UMBILICAL ADULT with mesh (N/A)  Patient Location: PACU  Anesthesia Type: General  Level of Consciousness: sedated, patient cooperative and responds to stimulation  Airway & Oxygen Therapy: Patient Spontanous Breathing and Patient connected to face mask oxgen  Post-op Assessment: Report given to PACU RN and Post -op Vital signs reviewed and stable  Post vital signs: Reviewed and stable  Complications: No apparent anesthesia complications

## 2013-04-10 NOTE — Op Note (Signed)
Surgeon: Wenda Low, MD, FACS  Asst:  none  Anes:  general  Procedure: Umbilical hernia repair with 4.3 cm Ventralex patch  Diagnosis: Umbilical hernia  Complications: none  EBL:   minimal cc  Description of Procedure:  The patient was taken to room 11 and given general anesthesia, prepped with PCMX, and draped.  After a timeout, I made an infraumbilical incision and dissected the skin from the hernia sac.  The hernia had the diameter of my index finger.  I dissected it free from the fascia in the preperitoneal space and did not get in to the abdomen.  I then inserted a 4.3 Ventralex patch and secured it with 8 simple sutures to the fascia and the mesh of the patch.  The fascia was injected with marcaine and the skin tacked down with a 4-0 vicryl.  The skin was closed with vicryl and steristrips.    Matt B. Daphine Deutscher, MD, [facility], [place] [phone]

## 2013-04-10 NOTE — Interval H&P Note (Signed)
History and Physical Interval Note:  04/10/2013 8:13 AM  Nathan [name]  has presented today for surgery, with the diagnosis of umbilical hernia  The various methods of treatment have been discussed with the patient and family. After consideration of risks, benefits and other options for treatment, the patient has consented to  Procedure(s): HERNIA REPAIR UMBILICAL ADULT (N/A) as a surgical intervention .  The patient's history has been reviewed, patient examined, no change in status, stable for surgery.  I have reviewed the patient's chart and labs.  Questions were answered to the patient's satisfaction.     , B

## 2013-04-10 NOTE — H&P (Signed)
Chief Complaint: Symptomatic umbilical hernia  History of Present Illness: Nathan [name] is an 67 y.o. male He has had an increasing umbilical hernia this kind worse recently. He denies any prior surgery in that area. We discussed elective repair with mesh I gave him a booklet on this. He is aware of the risk of hernia recurrence. He asked about the likelihood of bowel getting strangulated and left colon the only way we could look at the contents of his present hernia would be to do a CT scan. 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  I think we will would schedule him for open umbilical hernia repair Nathan [name]. He's had a porcine aortic valve replaced and I will probably keep him overnight. He also had a significant history of necrotizing fasciitis and Darnell Level operated on him so I got him to see him as well.  Past Medical History   Diagnosis  Date   .  Systolic hypertension    .  Sinus bradycardia    .  CAD (coronary artery disease)    .  History of prosthetic aortic valve    .  Aortic stenosis    .  History of gastrointestinal bleeding    .  Hypercholesteremia    .  Diabetes mellitus, type 2    .  History of diabetic retinopathy    .  History of necrotizing fascIItis    .  Postoperative anemia    .  Nightmares      CHRONIC   .  OSA (obstructive sleep apnea)      CPAP INTOLERANCE   .  Vitamin B12 deficiency    .  Diverticulosis of colon (without mention of hemorrhage)    .  Personal history of colonic polyps  11/23/2009     TUBULAR ADENOMA   .  Flesh-eating bacteria  1995   .  Stomach ulcer      Hx of   .  Blood transfusion without reported diagnosis    .  Heart murmur     Past Surgical History   Procedure  Laterality  Date   .  Retinal laser procedure     .  Coronary artery bypass graft     .  Scrotal surgery   1995     resection   .  Hernia repair   1972   .  Aortic valve replacement      Current Outpatient  Prescriptions   Medication  Sig  Dispense  Refill   .  ALPRAZolam (XANAX) 0.25 MG tablet  Take 0.25 mg by mouth as needed.     Marland Kitchen  amLODipine (NORVASC) 5 MG tablet  Take 5 mg by mouth daily.     Marland Kitchen  aspirin 325 MG tablet  Take 325 mg by mouth daily.     Marland Kitchen  atorvastatin (LIPITOR) 20 MG tablet      .  B-D ULTRAFINE III SHORT PEN 31G X 8 MM MISC  as directed.     .  Cholecalciferol (VITAMIN D3) 1000 UNITS capsule  Take 2,000 Units by mouth daily.     Marland Kitchen  co-enzyme Q-10 30 MG capsule  Take 100 mg by mouth 2 (two) times daily.     Marland Kitchen  doxycycline (VIBRAMYCIN) 100 MG capsule  Take 100 mg by mouth 2 (two) times daily. For 7 days     .  fish oil-omega-3 fatty acids 1000 MG capsule  Take 1  g by mouth daily.     Marland Kitchen  glipiZIDE (GLUCOTROL) 5 MG tablet  Take 5 mg by mouth daily.     .  hydrochlorothiazide (MICROZIDE) 12.5 MG capsule  Take 12.5 mg by mouth daily.     .  hydrocodone-acetaminophen (LORCET-HD) 5-500 MG per capsule  Take 1 capsule by mouth as needed.     .  insulin glargine (LANTUS SOLOSTAR) 100 UNIT/ML injection  Inject 16 Units into the skin every morning.     .  latanoprost (XALATAN) 0.005 % ophthalmic solution  Place 1 drop into both eyes at bedtime.     Marland Kitchen  loratadine-pseudoephedrine (CLARITIN-D 12-HOUR) 5-120 MG per tablet  Take 1 tablet by mouth as needed.     .  metFORMIN (GLUCOPHAGE) 1000 MG tablet  Take 1,000 mg by mouth 2 (two) times daily with a meal.     .  metoprolol (LOPRESSOR) 50 MG tablet  Take by mouth 2 (two) times daily.     .  minoxidil (LONITEN) 2.5 MG tablet  Take 2.5 mg by mouth daily.     .  Multiple Vitamin (MULTIVITAMIN) tablet  Take 1 tablet by mouth daily.     Marland Kitchen  olmesartan (BENICAR) 40 MG tablet  Take 1 tablet (40 mg total) by mouth daily.  30 tablet  5   .  omeprazole (PRILOSEC) 40 MG capsule  Take one tablet by mouth twice a day  60 capsule  3   .  rosuvastatin (CRESTOR) 5 MG tablet  Take by mouth daily.     Marland Kitchen  VITAMIN B1-B12 IM  Inject into the muscle. monthly       No current facility-administered medications for this visit.   Shellfish-derived products and Zolpidem tartrate  Family History   Problem  Relation  Age of Onset   .  Cirrhosis  Father    .  Alcohol abuse  Father    .  Rectal cancer  Mother    .  Diabetes  Mother    .  Hypertension  Sister    .  Diabetes  Sister    Social History: reports that he has never smoked. He has never used smokeless tobacco. He reports that drinks alcohol. He reports that he does not use illicit drugs.  REVIEW OF SYSTEMS - PERTINENT POSITIVES ONLY:  Negative for CNS, + heard valve but not on anticoagulants, neg ashtma,  Physical Exam:  Blood pressure 128/76, pulse 70, resp. rate 14, height 5\' 7"  (1.702 m), weight 200 lb 9.6 oz (90.992 kg).  Body mass index is 31.41 kg/(m^2).  Gen: WDWN WM NAD  Neurological: Alert and oriented to person, place, and time. Motor and sensory function is grossly intact  Head: Normocephalic and atraumatic.  Eyes: Conjunctivae are normal. Pupils are equal, round, and reactive to light. No scleral icterus.  Neck: Normal range of motion. Neck supple. No tracheal deviation or thyromegaly present.  Cardiovascular: SR 2/6 SEM No carotid bruits  Respiratory: Effort normal. No respiratory distress. No chest wall tenderness. Breath sounds normal. No wheezes, rales or rhonchi.  Abdomen: Protuberant Umbilical hernia easily reducible  GU:  Musculoskeletal: Normal range of motion. Extremities are nontender. No cyanosis, edema or clubbing noted Lymphadenopathy: No cervical, preauricular, postauricular or axillary adenopathy is present Skin: Skin is warm and dry. No rash noted. No diaphoresis. No erythema. No pallor. Pscyh: Normal mood and affect. Behavior is normal. Judgment and thought content normal.  LABORATORY RESULTS:  No results found for this or  any previous visit (from the past 48 hour(s)).  RADIOLOGY RESULTS:  No results found.  Problem List:  Patient Active Problem List     Diagnosis  Date Noted   .  Other general symptoms  01/06/2011   .  Personal history of colonic polyps  01/06/2011   .  Iron deficiency anemia  01/06/2011   .  Gastritis, chronic  01/06/2011   .  Benign hypertensive heart disease without heart failure  10/13/2010   .  Hx of CABG  10/13/2010   .  Systolic hypertension    .  Sinus bradycardia    .  CAD (coronary artery disease)    .  History of prosthetic aortic valve    .  History of diabetic retinopathy    .  History of necrotizing fascIItis    .  Postoperative anemia    .  Nightmares    .  B12 DEFICIENCY  12/02/2009   .  ESOPHAGEAL REFLUX  11/19/2009   .  CHEST PAIN  11/19/2009   .  BLOOD IN STOOL, OCCULT  11/19/2009   .  ANEMIA-IRON DEFICIENCY  08/19/2008   .  AORTIC STENOSIS  08/19/2008   .  GI BLEED  08/19/2008   .  DIABETES MELLITUS, TYPE II, MILD  08/14/2008   .  DYSLIPIDEMIA  08/14/2008   .  ANXIETY  08/14/2008   .  OBSTRUCTIVE SLEEP APNEA  08/14/2008   .  GLAUCOMA  08/14/2008   .  HYPERTENSION  08/14/2008   .  PHARYNGITIS  08/14/2008   .  GASTRIC ULCER  08/14/2008   .  GASTRITIS  08/14/2008   .  DIVERTICULOSIS, COLON  08/14/2008   Assessment & Plan:  Symptomatic umbilical hernia  Open repair with mesh  Matt B. Daphine Deutscher, MD, [facility]  [phone] beeper  [phone]

## 2013-04-10 NOTE — Anesthesia Preprocedure Evaluation (Addendum)
Anesthesia Evaluation  Patient identified by MRN, date of birth, ID band Patient awake    Reviewed: Allergy & Precautions, H&P , NPO status , Patient's Chart, lab work & pertinent test results  Airway       Dental  (+) Dental Advisory Given   Pulmonary sleep apnea ,          Cardiovascular hypertension, Pt. on home beta blockers and Pt. on medications + CAD and + CABG + Valvular Problems/Murmurs AS     Neuro/Psych PSYCHIATRIC DISORDERS Anxiety negative neurological ROS     GI/Hepatic Neg liver ROS, PUD, GERD-  Medicated,  Endo/Other  diabetes, Type 2, Oral Hypoglycemic Agents and Insulin Dependent  Renal/GU negative Renal ROS     Musculoskeletal negative musculoskeletal ROS (+)   Abdominal   Peds  Hematology negative hematology ROS (+)   Anesthesia Other Findings   Reproductive/Obstetrics                          Anesthesia Physical Anesthesia Plan  ASA: III  Anesthesia Plan: General   Post-op Pain Management:    Induction: Intravenous  Airway Management Planned: Oral ETT  Additional Equipment:   Intra-op Plan:   Post-operative Plan: Extubation in OR  Informed Consent: I have reviewed the patients History and Physical, chart, labs and discussed the procedure including the risks, benefits and alternatives for the proposed anesthesia with the patient or authorized representative who has indicated his/her understanding and acceptance.   Dental advisory given  Plan Discussed with: CRNA  Anesthesia Plan Comments:        Anesthesia Quick Evaluation

## 2013-04-11 ENCOUNTER — Encounter (HOSPITAL_COMMUNITY): Payer: Self-pay | Admitting: Surgery

## 2013-04-11 LAB — BASIC METABOLIC PANEL
BUN: 15 mg/dL (ref 6–23)
CO2: 29 mEq/L (ref 19–32)
Calcium: 9.4 mg/dL (ref 8.4–10.5)
Chloride: 101 mEq/L (ref 96–112)
Creatinine, Ser: 1.2 mg/dL (ref 0.50–1.35)
Glucose, Bld: 103 mg/dL — ABNORMAL HIGH (ref 70–99)
Sodium: 138 mEq/L (ref 135–145)

## 2013-04-11 LAB — CBC
Hemoglobin: 11.1 g/dL — ABNORMAL LOW (ref 13.0–17.0)
MCV: 80.9 fL (ref 78.0–100.0)
Platelets: 157 10*3/uL (ref 150–400)
RBC: 4.29 MIL/uL (ref 4.22–5.81)
WBC: 7.7 10*3/uL (ref 4.0–10.5)

## 2013-04-11 LAB — GLUCOSE, CAPILLARY: Glucose-Capillary: 92 mg/dL (ref 70–99)

## 2013-04-11 MED ORDER — OXYCODONE-ACETAMINOPHEN 5-325 MG PO TABS: 1.0000 | ORAL_TABLET | ORAL | Status: DC | PRN

## 2013-04-11 NOTE — Progress Notes (Signed)
Pt for D/C home today. IV d/c'd & pressure dressing applied. Dressing & abdominal binder intact & in place. Discharge instructions & Rx given with verbalized understanding. Family members at bedside to assist with d/c.

## 2013-04-11 NOTE — Discharge Summary (Signed)
Physician Discharge Summary  Patient ID: Nathan [name] MRN: [id number] DOB/AGE: December 26, 1945 67 y.o.  Admit date: 04/10/2013 Discharge date: 04/11/2013  Admission Diagnoses:  Umbilical hernia  Discharge Diagnoses:  same  Active Problems:    umbilical hernia repair open with Ventralex mesh Oct 2014   Surgery:  Open repair of umbilical hernia with Ventralex mesh  Discharged Condition: improved  Hospital Course:   Had surgery.  Kept overnight for observation.  Was doing well and ready for discharge.    Consults: none  Significant Diagnostic Studies: none    Discharge Exam: Blood pressure 132/54, pulse 55, temperature 97.5 F (36.4 C), temperature source Oral, resp. rate 16, height 5\' 7"  (1.702 m), weight 196 lb (88.905 kg), SpO2 99.00%. Pain controlled.  Sore as expected  Disposition:   Discharge Orders   Future Appointments Provider Department Dept Phone   05/10/2013 2:00 PM Valarie Merino, MD [facility], [place] [phone]   05/22/2013 9:15 AM Cassell Clement, MD [facility] [phone]   Future Orders Complete By Expires   Diet - low sodium heart healthy  As directed    Discharge instructions  As directed    Comments:     Wear abdominal binder during the day and when lifting   Discharge wound care:  As directed    Comments:     Remove dressing and shower tomorrow   Increase activity slowly  As directed        Medication List         ALPRAZolam 0.25 MG tablet  Commonly known as:  XANAX  Take 0.25 mg by mouth daily as needed for anxiety.     amLODipine 5 MG tablet  Commonly known as:  NORVASC  Take 5 mg by mouth every morning.     aspirin 325 MG tablet  Take 325 mg by mouth daily.     atorvastatin 20 MG tablet  Commonly known as:  LIPITOR  Take 20 mg by mouth every morning.     Cholecalciferol 1000 UNITS capsule  Take 2,000 Units by mouth daily.     co-enzyme Q-10 30 MG capsule  Take 100 mg by mouth 2 (two) times  daily.     doxycycline 100 MG capsule  Commonly known as:  VIBRAMYCIN  Take 100 mg by mouth 2 (two) times daily. For 7 days for dentist appointments     fish oil-omega-3 fatty acids 1000 MG capsule  Take 1 g by mouth daily.     glipiZIDE 5 MG tablet  Commonly known as:  GLUCOTROL  Take 5 mg by mouth every morning.     hydrochlorothiazide 12.5 MG capsule  Commonly known as:  MICROZIDE  Take 12.5 mg by mouth daily.     HYDROcodone-acetaminophen 5-500 MG per tablet  Commonly known as:  VICODIN  Take 0.5 tablets by mouth every 6 (six) hours as needed for pain.     LANTUS SOLOSTAR 100 UNIT/ML injection  Generic drug:  insulin glargine  Inject 14 Units into the skin every morning.     latanoprost 0.005 % ophthalmic solution  Commonly known as:  XALATAN  Place 1 drop into both eyes at bedtime.     metFORMIN 1000 MG tablet  Commonly known as:  GLUCOPHAGE  Take 1,000 mg by mouth 2 (two) times daily with a meal.     metoprolol 50 MG tablet  Commonly known as:  LOPRESSOR  Take by mouth 2 (two) times daily.     minoxidil  2.5 MG tablet  Commonly known as:  LONITEN  Take 2.5 mg by mouth at bedtime.     multivitamin tablet  Take 1 tablet by mouth daily.     olmesartan 20 MG tablet  Commonly known as:  BENICAR  Take 20 mg by mouth every morning.     omeprazole 40 MG capsule  Commonly known as:  PRILOSEC  Take 40 mg by mouth 2 (two) times daily.     oxyCODONE-acetaminophen 5-325 MG per tablet  Commonly known as:  PERCOCET/ROXICET  Take 1 tablet by mouth every 4 (four) hours as needed.     oxymetazoline 0.05 % nasal spray  Commonly known as:  AFRIN  Place 2 sprays into the nose 2 (two) times daily as needed for congestion.     rosuvastatin 5 MG tablet  Commonly known as:  CRESTOR  Take 5 mg by mouth every morning.           Follow-up Information   Follow up with Valarie Merino, MD. (appt already made)    Specialty:  General Surgery   Contact information:   [address] [place] [address] [phone]       Signed: Valarie Merino 04/11/2013, 8:19 AM

## 2013-04-11 NOTE — Anesthesia Postprocedure Evaluation (Signed)
Anesthesia Post Note  Patient: Nathan [name]  Procedure(s) Performed: Procedure(s) (LRB): HERNIA REPAIR UMBILICAL ADULT with mesh (N/A)  Anesthesia type: General  Patient location: PACU  Post pain: Pain level controlled  Post assessment: Post-op Vital signs reviewed  Last Vitals: BP 111/53  Pulse 56  Temp(Src) 36.6 C (Oral)  Resp 16  Ht 5\' 7"  (1.702 m)  Wt 196 lb (88.905 kg)  BMI 30.69 kg/m2  SpO2 98%  Post vital signs: Reviewed  Level of consciousness: sedated  Complications: No apparent anesthesia complications

## 2013-05-10 ENCOUNTER — Encounter (INDEPENDENT_AMBULATORY_CARE_PROVIDER_SITE_OTHER): Payer: Self-pay | Admitting: Surgery

## 2013-05-10 ENCOUNTER — Ambulatory Visit (INDEPENDENT_AMBULATORY_CARE_PROVIDER_SITE_OTHER): Payer: Medicare Other | Admitting: Surgery

## 2013-05-10 VITALS — BP 132/72 | HR 60 | Temp 98.0°F | Resp 18 | Ht 67.0 in | Wt 196.0 lb

## 2013-05-10 DIAGNOSIS — M25579 Pain in unspecified ankle and joints of unspecified foot: Secondary | ICD-10-CM

## 2013-05-10 DIAGNOSIS — M25572 Pain in left ankle and joints of left foot: Secondary | ICD-10-CM

## 2013-05-10 NOTE — Progress Notes (Signed)
Trong Gosling 67 y.o.  Body mass index is 30.69 kg/(m^2).  Patient Active Problem List   Diagnosis Date Noted  .  umbilical hernia repair open with Ventralex mesh Oct 2014 04/10/2013  . Other general symptoms  01/06/2011  . Personal history of colonic polyps 01/06/2011  . Iron deficiency anemia 01/06/2011  . Gastritis, chronic 01/06/2011  . Benign hypertensive heart disease without heart failure 10/13/2010  . Hx of CABG 10/13/2010  . Systolic hypertension   . Sinus bradycardia   . CAD (coronary artery disease)   . History of prosthetic aortic valve   . History of diabetic retinopathy   . History of necrotizing fascIItis   . Postoperative anemia   . Nightmares   . B12 DEFICIENCY 12/02/2009  . ESOPHAGEAL REFLUX 11/19/2009  . CHEST PAIN 11/19/2009  . BLOOD IN STOOL, OCCULT 11/19/2009  . ANEMIA-IRON DEFICIENCY 08/19/2008  . AORTIC STENOSIS 08/19/2008  . GI BLEED 08/19/2008  . DIABETES MELLITUS, TYPE II, MILD 08/14/2008  . DYSLIPIDEMIA 08/14/2008  . ANXIETY 08/14/2008  . OBSTRUCTIVE SLEEP APNEA 08/14/2008  . GLAUCOMA 08/14/2008  . HYPERTENSION 08/14/2008  . PHARYNGITIS 08/14/2008  . GASTRIC ULCER 08/14/2008  . GASTRITIS 08/14/2008  . DIVERTICULOSIS, COLON 08/14/2008    Allergies  Allergen Reactions  . Shellfish-Derived Products Anaphylaxis  . Zolpidem Tartrate Other (See Comments)    Hallucinations    Past Surgical History  Procedure Laterality Date  . Retinal laser procedure    . Coronary artery bypass graft    . Scrotal surgery  1995    resection  . Hernia repair  1972  . Aortic valve replacement    . Flesh eating disease surgery       surgeries x 7   . Umbilical hernia repair N/A 04/10/2013    Procedure: HERNIA REPAIR UMBILICAL ADULT with mesh;  Surgeon: Valarie Merino, MD;  Location: [facility];  Service: General;  Laterality: N/A;   Nathan Fillers, MD 1. Pain in joint, ankle and foot, left     Patient is 31 days post umbilical herniorrhaphy with mesh.  His incision is healed nicely. The repair is intact. After injecting Hemovacs peripherally says he only took one pain pill basically had no pain.  I instructed that he can go ahead and resume his normal activities.  He did however indicates he is been having pain and swelling in his left foot. On examination he has a swollen proximal metal tarsal region. This could represent a inflammatory arthritis. I offered to go in order an x-ray to see if there is any bony issue that is contributing to this. He does have some past cavus deformity of the foot bilaterally.  Will order x-ray of left foot complete. In the meantime I have asked him to contact Dr. Jarold Motto to see if he needs some form of anti-inflammatories beyond NSAIS to to help the pain in his left foot. Matt B. Daphine Deutscher, MD, [facility] [phone] beeper [phone]  05/10/2013 2:35 PM

## 2013-05-15 ENCOUNTER — Ambulatory Visit
Admission: RE | Admit: 2013-05-15 | Discharge: 2013-05-15 | Disposition: A | Discharge status: Home / Self Care | Payer: Medicare Other | Source: Ambulatory Visit | Attending: Surgery | Admitting: Surgery

## 2013-05-15 DIAGNOSIS — M79609 Pain in unspecified limb: Secondary | ICD-10-CM | POA: Diagnosis not present

## 2013-05-15 DIAGNOSIS — M25572 Pain in left ankle and joints of left foot: Secondary | ICD-10-CM

## 2013-05-16 DIAGNOSIS — E11349 Type 2 diabetes mellitus with severe nonproliferative diabetic retinopathy without macular edema: Secondary | ICD-10-CM | POA: Diagnosis not present

## 2013-05-16 DIAGNOSIS — H4011X0 Primary open-angle glaucoma, stage unspecified: Secondary | ICD-10-CM | POA: Diagnosis not present

## 2013-05-16 DIAGNOSIS — H2510 Age-related nuclear cataract, unspecified eye: Secondary | ICD-10-CM | POA: Diagnosis not present

## 2013-05-16 DIAGNOSIS — E1139 Type 2 diabetes mellitus with other diabetic ophthalmic complication: Secondary | ICD-10-CM | POA: Diagnosis not present

## 2013-05-22 ENCOUNTER — Ambulatory Visit (INDEPENDENT_AMBULATORY_CARE_PROVIDER_SITE_OTHER): Payer: Medicare Other | Admitting: Cardiology

## 2013-05-22 ENCOUNTER — Encounter: Payer: Self-pay | Admitting: Cardiology

## 2013-05-22 VITALS — BP 124/58 | HR 64 | Ht 67.0 in | Wt 195.0 lb

## 2013-05-22 DIAGNOSIS — I2510 Atherosclerotic heart disease of native coronary artery without angina pectoris: Secondary | ICD-10-CM | POA: Diagnosis not present

## 2013-05-22 DIAGNOSIS — Z954 Presence of other heart-valve replacement: Secondary | ICD-10-CM

## 2013-05-22 DIAGNOSIS — Z952 Presence of prosthetic heart valve: Secondary | ICD-10-CM

## 2013-05-22 DIAGNOSIS — I10 Essential (primary) hypertension: Secondary | ICD-10-CM

## 2013-05-22 DIAGNOSIS — E119 Type 2 diabetes mellitus without complications: Secondary | ICD-10-CM | POA: Diagnosis not present

## 2013-05-22 NOTE — Patient Instructions (Signed)
Your physician recommends that you continue on your current medications as directed. Please refer to the Current Medication list given to you today.  Your physician recommends that you schedule a follow-up appointment in: 4 months with Dr. Patty Sermons (you will have an EKG that day)

## 2013-05-22 NOTE — Assessment & Plan Note (Signed)
Blood pressure has been remaining stable on current therapy. 

## 2013-05-22 NOTE — Progress Notes (Signed)
Nathan [name] Date of Birth:  1946/05/24 [address] [place], [place]  [address] [phone]         Fax   [phone]  History of Present Illness: This pleasant 67 year old gentleman is seen for a scheduled four-month followup office visit. He has a history of coronary artery bypass graft surgery and aortic valve replacement for severe aortic stenosis in April 2010. He has a tissue valve. His last echocardiogram 12/02/09 showed a normally functioning prosthetic aortic valve and showed normal LV systolic function with grade 1 diastolic dysfunction. The patient has a history of high blood pressure, diabetes, and hypercholesterolemia. Also has a history of nightmares and night terrors.  The patient continues to work in a [employer].  He intends to work for another several years until his wife retires from her work. The patient has had some recent problems with his left foot.  He had recent x-rays of the foot which raised the question of possible gout.  Current Outpatient Prescriptions  Medication Sig Dispense Refill  . ALPRAZolam (XANAX) 0.25 MG tablet Take 0.25 mg by mouth daily as needed for anxiety.       Marland Kitchen amLODipine (NORVASC) 5 MG tablet Take 5 mg by mouth every morning.       Marland Kitchen aspirin 325 MG tablet Take 325 mg by mouth daily.        Marland Kitchen atorvastatin (LIPITOR) 20 MG tablet Take 20 mg by mouth every morning.       . Cholecalciferol (VITAMIN D3) 1000 UNITS capsule Take 2,000 Units by mouth daily.       Marland Kitchen co-enzyme Q-10 30 MG capsule Take 100 mg by mouth 2 (two) times daily.      Marland Kitchen doxycycline (VIBRAMYCIN) 100 MG capsule Take 100 mg by mouth 2 (two) times daily. For 7 days for dentist appointments      . fish oil-omega-3 fatty acids 1000 MG capsule Take 1 g by mouth daily.        Marland Kitchen glipiZIDE (GLUCOTROL) 5 MG tablet Take 5 mg by mouth every morning.       . hydrochlorothiazide (MICROZIDE) 12.5 MG capsule Take 12.5 mg by mouth daily.      Marland Kitchen HYDROcodone-acetaminophen (VICODIN)  5-500 MG per tablet Take 0.5 tablets by mouth every 6 (six) hours as needed for pain.      Marland Kitchen insulin glargine (LANTUS SOLOSTAR) 100 UNIT/ML injection Inject 14 Units into the skin every morning.       . latanoprost (XALATAN) 0.005 % ophthalmic solution Place 1 drop into both eyes at bedtime.        . metFORMIN (GLUCOPHAGE) 1000 MG tablet Take 1,000 mg by mouth 2 (two) times daily with a meal.        . metoprolol (LOPRESSOR) 50 MG tablet Take by mouth 2 (two) times daily.        . minoxidil (LONITEN) 2.5 MG tablet Take 2.5 mg by mouth at bedtime.       . Multiple Vitamin (MULTIVITAMIN) tablet Take 1 tablet by mouth daily.        Marland Kitchen olmesartan (BENICAR) 20 MG tablet Take 20 mg by mouth every morning.      Marland Kitchen omeprazole (PRILOSEC) 40 MG capsule Take 40 mg by mouth 2 (two) times daily.      Marland Kitchen oxyCODONE-acetaminophen (PERCOCET/ROXICET) 5-325 MG per tablet Take 1 tablet by mouth every 4 (four) hours as needed.  30 tablet  0  . oxymetazoline (AFRIN) 0.05 % nasal spray  Place 2 sprays into the nose 2 (two) times daily as needed for congestion.      . rosuvastatin (CRESTOR) 5 MG tablet Take 5 mg by mouth every morning.        No current facility-administered medications for this visit.    Allergies  Allergen Reactions  . Shellfish-Derived Products Anaphylaxis  . Zolpidem Tartrate Other (See Comments)    Hallucinations    Patient Active Problem List   Diagnosis Date Noted  . Benign hypertensive heart disease without heart failure 10/13/2010    Priority: High  . Hx of CABG 10/13/2010    Priority: Medium  .  umbilical hernia repair open with Ventralex mesh Oct 2014 04/10/2013  . Other general symptoms  01/06/2011  . Personal history of colonic polyps 01/06/2011  . Iron deficiency anemia 01/06/2011  . Gastritis, chronic 01/06/2011  . Systolic hypertension   . Sinus bradycardia   . CAD (coronary artery disease)   . History of prosthetic aortic valve   . History of diabetic retinopathy   .  History of necrotizing fascIItis   . Postoperative anemia   . Nightmares   . B12 DEFICIENCY 12/02/2009  . ESOPHAGEAL REFLUX 11/19/2009  . CHEST PAIN 11/19/2009  . BLOOD IN STOOL, OCCULT 11/19/2009  . ANEMIA-IRON DEFICIENCY 08/19/2008  . AORTIC STENOSIS 08/19/2008  . GI BLEED 08/19/2008  . DIABETES MELLITUS, TYPE II, MILD 08/14/2008  . DYSLIPIDEMIA 08/14/2008  . ANXIETY 08/14/2008  . OBSTRUCTIVE SLEEP APNEA 08/14/2008  . GLAUCOMA 08/14/2008  . HYPERTENSION 08/14/2008  . PHARYNGITIS 08/14/2008  . GASTRIC ULCER 08/14/2008  . GASTRITIS 08/14/2008  . DIVERTICULOSIS, COLON 08/14/2008    History  Smoking status  . Never Smoker   Smokeless tobacco  . Never Used    History  Alcohol Use No    Family History  Problem Relation Age of Onset  . Cirrhosis Father   . Alcohol abuse Father   . Rectal cancer Mother   . Diabetes Mother   . Hypertension Sister   . Diabetes Sister     Review of Systems: Constitutional: no fever chills diaphoresis or fatigue or change in weight.  Head and neck: no hearing loss, no epistaxis, no photophobia or visual disturbance. Respiratory: No cough, shortness of breath or wheezing. Cardiovascular: No chest pain peripheral edema, palpitations. Gastrointestinal: No abdominal distention, no abdominal pain, no change in bowel habits hematochezia or melena. Genitourinary: No dysuria, no frequency, no urgency, no nocturia. Musculoskeletal:No arthralgias, no back pain, no gait disturbance or myalgias. Neurological: No dizziness, no headaches, no numbness, no seizures, no syncope, no weakness, no tremors. Hematologic: No lymphadenopathy, no easy bruising. Psychiatric: No confusion, no hallucinations, no sleep disturbance.    Physical Exam: Filed Vitals:   05/22/13 0923  BP: 124/58  Pulse: 64   the general appearance reveals a well-developed well-nourished gentleman in no distress.  His weight is down 2 pounds since last visit.The head and neck exam  reveals pupils equal and reactive.  Extraocular movements are full.  There is no scleral icterus.  The mouth and pharynx are normal.  The neck is supple.  The carotids reveal no bruits.  The jugular venous pressure is normal.  The  thyroid is not enlarged.  There is no lymphadenopathy.  The chest is clear to percussion and auscultation.  There are no rales or rhonchi.  Expansion of the chest is symmetrical.  The precordium is quiet.  The first heart sound is normal.  The second heart sound is physiologically split.  There is a soft systolic flow murmur across his prosthetic aortic valve.  No diastolic murmur.  There is no abnormal lift or heave.  The abdomen is soft and nontender.  The bowel sounds are normal.  The liver and spleen are not enlarged.  There are no abdominal masses.  There are no abdominal bruits.  Extremities reveal good pedal pulses.  There is trace ankle edema. There is no cyanosis or clubbing.  Strength is normal and symmetrical in all extremities.  There is no lateralizing weakness.  There are no sensory deficits.  The skin is warm and dry.  There is no rash.    Assessment / Plan: Continue same medication.  Recheck in 4 months for followup office visit and EKG.  Dr. Eloise Harman has been following his lipids. The patient will contact Dr. Eloise Harman regarding his left foot problem and question of possible gout.

## 2013-05-22 NOTE — Assessment & Plan Note (Signed)
No hypoglycemic episodes. 

## 2013-05-22 NOTE — Assessment & Plan Note (Signed)
The patient has not been expressing any angina pectoris. 

## 2013-05-22 NOTE — Assessment & Plan Note (Signed)
The patient has not been having any symptoms of congestive heart failure.  No thromboembolic symptoms.  Has a bioprosthetic valve.

## 2013-05-23 DIAGNOSIS — D510 Vitamin B12 deficiency anemia due to intrinsic factor deficiency: Secondary | ICD-10-CM | POA: Diagnosis not present

## 2013-05-23 DIAGNOSIS — M109 Gout, unspecified: Secondary | ICD-10-CM | POA: Diagnosis not present

## 2013-06-24 ENCOUNTER — Other Ambulatory Visit: Payer: Self-pay | Admitting: Gastroenterology

## 2013-07-24 DIAGNOSIS — D1800 Hemangioma unspecified site: Secondary | ICD-10-CM | POA: Diagnosis not present

## 2013-07-24 DIAGNOSIS — L608 Other nail disorders: Secondary | ICD-10-CM | POA: Diagnosis not present

## 2013-07-24 DIAGNOSIS — D239 Other benign neoplasm of skin, unspecified: Secondary | ICD-10-CM | POA: Diagnosis not present

## 2013-07-24 DIAGNOSIS — E119 Type 2 diabetes mellitus without complications: Secondary | ICD-10-CM | POA: Diagnosis not present

## 2013-07-25 DIAGNOSIS — D510 Vitamin B12 deficiency anemia due to intrinsic factor deficiency: Secondary | ICD-10-CM | POA: Diagnosis not present

## 2013-08-07 DIAGNOSIS — M109 Gout, unspecified: Secondary | ICD-10-CM | POA: Diagnosis not present

## 2013-08-07 DIAGNOSIS — I10 Essential (primary) hypertension: Secondary | ICD-10-CM | POA: Diagnosis not present

## 2013-08-07 DIAGNOSIS — E1139 Type 2 diabetes mellitus with other diabetic ophthalmic complication: Secondary | ICD-10-CM | POA: Diagnosis not present

## 2013-08-07 DIAGNOSIS — Z125 Encounter for screening for malignant neoplasm of prostate: Secondary | ICD-10-CM | POA: Diagnosis not present

## 2013-08-07 DIAGNOSIS — D510 Vitamin B12 deficiency anemia due to intrinsic factor deficiency: Secondary | ICD-10-CM | POA: Diagnosis not present

## 2013-08-08 DIAGNOSIS — D1809 Hemangioma of other sites: Secondary | ICD-10-CM | POA: Diagnosis not present

## 2013-08-08 DIAGNOSIS — D1039 Benign neoplasm of other parts of mouth: Secondary | ICD-10-CM | POA: Diagnosis not present

## 2013-08-14 DIAGNOSIS — Z125 Encounter for screening for malignant neoplasm of prostate: Secondary | ICD-10-CM | POA: Diagnosis not present

## 2013-08-14 DIAGNOSIS — Z0000 Encounter for general adult medical examination without abnormal findings: Secondary | ICD-10-CM | POA: Diagnosis not present

## 2013-08-14 DIAGNOSIS — I10 Essential (primary) hypertension: Secondary | ICD-10-CM | POA: Diagnosis not present

## 2013-08-14 DIAGNOSIS — Z1331 Encounter for screening for depression: Secondary | ICD-10-CM | POA: Diagnosis not present

## 2013-08-14 DIAGNOSIS — I2510 Atherosclerotic heart disease of native coronary artery without angina pectoris: Secondary | ICD-10-CM | POA: Diagnosis not present

## 2013-08-14 DIAGNOSIS — M109 Gout, unspecified: Secondary | ICD-10-CM | POA: Diagnosis not present

## 2013-08-14 DIAGNOSIS — D510 Vitamin B12 deficiency anemia due to intrinsic factor deficiency: Secondary | ICD-10-CM | POA: Diagnosis not present

## 2013-08-14 DIAGNOSIS — I359 Nonrheumatic aortic valve disorder, unspecified: Secondary | ICD-10-CM | POA: Diagnosis not present

## 2013-08-14 DIAGNOSIS — E785 Hyperlipidemia, unspecified: Secondary | ICD-10-CM | POA: Diagnosis not present

## 2013-08-14 DIAGNOSIS — E1139 Type 2 diabetes mellitus with other diabetic ophthalmic complication: Secondary | ICD-10-CM | POA: Diagnosis not present

## 2013-08-15 DIAGNOSIS — E11349 Type 2 diabetes mellitus with severe nonproliferative diabetic retinopathy without macular edema: Secondary | ICD-10-CM | POA: Diagnosis not present

## 2013-08-15 DIAGNOSIS — E1139 Type 2 diabetes mellitus with other diabetic ophthalmic complication: Secondary | ICD-10-CM | POA: Diagnosis not present

## 2013-08-15 DIAGNOSIS — H4011X0 Primary open-angle glaucoma, stage unspecified: Secondary | ICD-10-CM | POA: Diagnosis not present

## 2013-08-19 DIAGNOSIS — Z1212 Encounter for screening for malignant neoplasm of rectum: Secondary | ICD-10-CM | POA: Diagnosis not present

## 2013-08-21 DIAGNOSIS — D1809 Hemangioma of other sites: Secondary | ICD-10-CM | POA: Diagnosis not present

## 2013-08-21 DIAGNOSIS — D1039 Benign neoplasm of other parts of mouth: Secondary | ICD-10-CM | POA: Diagnosis not present

## 2013-08-28 DIAGNOSIS — D1809 Hemangioma of other sites: Secondary | ICD-10-CM | POA: Diagnosis not present

## 2013-08-28 DIAGNOSIS — D1039 Benign neoplasm of other parts of mouth: Secondary | ICD-10-CM | POA: Diagnosis not present

## 2013-09-11 DIAGNOSIS — D510 Vitamin B12 deficiency anemia due to intrinsic factor deficiency: Secondary | ICD-10-CM | POA: Diagnosis not present

## 2013-09-18 ENCOUNTER — Ambulatory Visit (INDEPENDENT_AMBULATORY_CARE_PROVIDER_SITE_OTHER): Payer: Medicare Other | Admitting: Cardiology

## 2013-09-18 ENCOUNTER — Encounter: Payer: Self-pay | Admitting: Cardiology

## 2013-09-18 VITALS — BP 126/68 | HR 68 | Ht 67.0 in | Wt 196.1 lb

## 2013-09-18 DIAGNOSIS — Z952 Presence of prosthetic heart valve: Secondary | ICD-10-CM

## 2013-09-18 DIAGNOSIS — Z954 Presence of other heart-valve replacement: Secondary | ICD-10-CM

## 2013-09-18 DIAGNOSIS — E119 Type 2 diabetes mellitus without complications: Secondary | ICD-10-CM | POA: Diagnosis not present

## 2013-09-18 DIAGNOSIS — I119 Hypertensive heart disease without heart failure: Secondary | ICD-10-CM | POA: Diagnosis not present

## 2013-09-18 DIAGNOSIS — I2510 Atherosclerotic heart disease of native coronary artery without angina pectoris: Secondary | ICD-10-CM

## 2013-09-18 NOTE — Patient Instructions (Signed)
Your physician recommends that you continue on your current medications as directed. Please refer to the Current Medication list given to you today. Your physician wants you to follow-up in: 4 months You will receive a reminder letter in the mail two months in advance. If you don't receive a letter, please call our office to schedule the follow-up appointment.  

## 2013-09-18 NOTE — Assessment & Plan Note (Signed)
The patient has not had any recurrent chest pain or angina.  He had been walking but recently slipped and fell on the ice and since then has cut back on his walking.

## 2013-09-18 NOTE — Progress Notes (Signed)
Nathan [name] Date of Birth:  11/02/1945 [address] [place], [place]  [address] [phone]         Fax   [phone]  History of Present Illness: This pleasant 68 year old gentleman is seen for a scheduled four-month followup office visit. He has a history of coronary artery bypass graft surgery and aortic valve replacement for severe aortic stenosis in April 2010. He has a tissue valve. His last echocardiogram 12/02/09 showed a normally functioning prosthetic aortic valve and showed normal LV systolic function with grade 1 diastolic dysfunction. The patient has a history of high blood pressure, diabetes, and hypercholesterolemia. Also has a history of nightmares and night terrors.  The patient continues to work in a [place].  He intends to work for another several years until his wife retires from her work. Since last visit he has been started on allopurinol after having an episode of symptomatic gout of his great toe. The patient has had some recent problems with his left foot.  He had recent x-rays of the foot which raised the question of possible gout.  Current Outpatient Prescriptions  Medication Sig Dispense Refill  . allopurinol (ZYLOPRIM) 300 MG tablet Take 300 mg by mouth daily.      Marland Kitchen ALPRAZolam (XANAX) 0.25 MG tablet Take 0.25 mg by mouth daily as needed for anxiety.       Marland Kitchen amLODipine (NORVASC) 5 MG tablet Take 5 mg by mouth every morning.       Marland Kitchen aspirin 325 MG tablet Take 325 mg by mouth daily.        . Cholecalciferol (VITAMIN D3) 1000 UNITS capsule Take 2,000 Units by mouth daily.       Marland Kitchen co-enzyme Q-10 30 MG capsule Take 100 mg by mouth 2 (two) times daily.      . Cyanocobalamin (VITAMIN B-12 IJ) Inject as directed.      . doxycycline (VIBRAMYCIN) 100 MG capsule Take 100 mg by mouth 2 (two) times daily. For 7 days for dentist appointments      . fish oil-omega-3 fatty acids 1000 MG capsule Take 1 g by mouth daily.        Marland Kitchen glipiZIDE (GLUCOTROL) 5 MG  tablet Take 5 mg by mouth every morning.       . hydrochlorothiazide (MICROZIDE) 12.5 MG capsule Take 12.5 mg by mouth daily.      Marland Kitchen HYDROcodone-acetaminophen (VICODIN) 5-500 MG per tablet Take 0.5 tablets by mouth every 6 (six) hours as needed for pain.      Marland Kitchen insulin glargine (LANTUS SOLOSTAR) 100 UNIT/ML injection Inject 14 Units into the skin every morning.       . latanoprost (XALATAN) 0.005 % ophthalmic solution Place 1 drop into both eyes at bedtime.        . metFORMIN (GLUCOPHAGE) 1000 MG tablet Take 1,000 mg by mouth 2 (two) times daily with a meal.        . metoprolol (LOPRESSOR) 50 MG tablet Take by mouth 2 (two) times daily.        . minoxidil (LONITEN) 2.5 MG tablet Take 2.5 mg by mouth at bedtime.       . Multiple Vitamin (MULTIVITAMIN) tablet Take 1 tablet by mouth daily.        Marland Kitchen olmesartan (BENICAR) 20 MG tablet Take 20 mg by mouth every morning.      Marland Kitchen omeprazole (PRILOSEC) 40 MG capsule TAKE ONE CAPSULE BY MOUTH TWICE DAILY   60 capsule  2  .  oxyCODONE-acetaminophen (PERCOCET/ROXICET) 5-325 MG per tablet Take 1 tablet by mouth every 4 (four) hours as needed.  30 tablet  0  . oxymetazoline (AFRIN) 0.05 % nasal spray Place 2 sprays into the nose 2 (two) times daily as needed for congestion.      . rosuvastatin (CRESTOR) 5 MG tablet Take 5 mg by mouth every morning.        No current facility-administered medications for this visit.    Allergies  Allergen Reactions  . Shellfish-Derived Products Anaphylaxis  . Zolpidem Tartrate Other (See Comments)    Hallucinations    Patient Active Problem List   Diagnosis Date Noted  . Benign hypertensive heart disease without heart failure 10/13/2010    Priority: High  . Hx of CABG 10/13/2010    Priority: Medium  .  umbilical hernia repair open with Ventralex mesh Oct 2014 04/10/2013  . Other general symptoms  01/06/2011  . Personal history of colonic polyps 01/06/2011  . Iron deficiency anemia 01/06/2011  . Gastritis, chronic  01/06/2011  . Systolic hypertension   . Sinus bradycardia   . CAD (coronary artery disease)   . History of prosthetic aortic valve   . History of diabetic retinopathy   . History of necrotizing fascIItis   . Postoperative anemia   . Nightmares   . B12 DEFICIENCY 12/02/2009  . ESOPHAGEAL REFLUX 11/19/2009  . CHEST PAIN 11/19/2009  . BLOOD IN STOOL, OCCULT 11/19/2009  . ANEMIA-IRON DEFICIENCY 08/19/2008  . AORTIC STENOSIS 08/19/2008  . GI BLEED 08/19/2008  . DIABETES MELLITUS, TYPE II, MILD 08/14/2008  . DYSLIPIDEMIA 08/14/2008  . ANXIETY 08/14/2008  . OBSTRUCTIVE SLEEP APNEA 08/14/2008  . GLAUCOMA 08/14/2008  . HYPERTENSION 08/14/2008  . PHARYNGITIS 08/14/2008  . GASTRIC ULCER 08/14/2008  . GASTRITIS 08/14/2008  . DIVERTICULOSIS, COLON 08/14/2008    History  Smoking status  . Never Smoker   Smokeless tobacco  . Never Used    History  Alcohol Use No    Family History  Problem Relation Age of Onset  . Cirrhosis Father   . Alcohol abuse Father   . Rectal cancer Mother   . Diabetes Mother   . Hypertension Sister   . Diabetes Sister     Review of Systems: Constitutional: no fever chills diaphoresis or fatigue or change in weight.  Head and neck: no hearing loss, no epistaxis, no photophobia or visual disturbance. Respiratory: No cough, shortness of breath or wheezing. Cardiovascular: No chest pain peripheral edema, palpitations. Gastrointestinal: No abdominal distention, no abdominal pain, no change in bowel habits hematochezia or melena. Genitourinary: No dysuria, no frequency, no urgency, no nocturia. Musculoskeletal:No arthralgias, no back pain, no gait disturbance or myalgias. Neurological: No dizziness, no headaches, no numbness, no seizures, no syncope, no weakness, no tremors. Hematologic: No lymphadenopathy, no easy bruising. Psychiatric: No confusion, no hallucinations, no sleep disturbance.    Physical Exam: Filed Vitals:   09/18/13 0850  BP:  126/68  Pulse: 68   the general appearance reveals a well-developed well-nourished gentleman in no distress.  His weight is down 2 pounds since last visit.The head and neck exam reveals pupils equal and reactive.  Extraocular movements are full.  There is no scleral icterus.  The mouth and pharynx are normal.  The neck is supple.  The carotids reveal no bruits.  The jugular venous pressure is normal.  The  thyroid is not enlarged.  There is no lymphadenopathy.  The chest is clear to percussion and auscultation.  There are no  rales or rhonchi.  Expansion of the chest is symmetrical.  The precordium is quiet.  The first heart sound is normal.  The second heart sound is physiologically split.  There is a soft systolic flow murmur across his prosthetic aortic valve.  No diastolic murmur.  There is no abnormal lift or heave.  The abdomen is soft and nontender.  The bowel sounds are normal.  The liver and spleen are not enlarged.  There are no abdominal masses.  There are no abdominal bruits.  Extremities reveal good pedal pulses.  There is trace ankle edema. There is no cyanosis or clubbing.  Strength is normal and symmetrical in all extremities.  There is no lateralizing weakness.  There are no sensory deficits.  The skin is warm and dry.  There is no rash.  EKG today shows normal sinus rhythm and minor nonspecific T-wave flattening and is unchanged from 01/17/13.  Assessment / Plan: Continue same medication.  Since last visit the patient had successful mesh surgery for repair of umbilical hernia by Dr. Hassell Done. Recheck in 4 months for office visit.

## 2013-09-18 NOTE — Assessment & Plan Note (Signed)
No fever or chills.  No constitutional symptoms.  No symptoms of CHF.

## 2013-09-18 NOTE — Assessment & Plan Note (Signed)
Blood pressure has been remaining stable on current therapy.  No symptoms of CHF.  No palpitations.  No dizziness or syncope.

## 2013-10-09 ENCOUNTER — Other Ambulatory Visit: Payer: Self-pay | Admitting: Gastroenterology

## 2013-10-09 DIAGNOSIS — D510 Vitamin B12 deficiency anemia due to intrinsic factor deficiency: Secondary | ICD-10-CM | POA: Diagnosis not present

## 2013-10-09 NOTE — Telephone Encounter (Signed)
LMOM at home and cell for patient to call office back. 

## 2013-10-10 NOTE — Telephone Encounter (Signed)
LMOM for patient to call back  Will deny RX--need patient to contact office Patient needs to make an office visit for further refills Office visit follow up is due 01-2014

## 2013-10-11 ENCOUNTER — Other Ambulatory Visit: Payer: Self-pay | Admitting: Gastroenterology

## 2013-11-13 DIAGNOSIS — D510 Vitamin B12 deficiency anemia due to intrinsic factor deficiency: Secondary | ICD-10-CM | POA: Diagnosis not present

## 2013-11-21 DIAGNOSIS — H4011X0 Primary open-angle glaucoma, stage unspecified: Secondary | ICD-10-CM | POA: Diagnosis not present

## 2013-11-21 DIAGNOSIS — E1139 Type 2 diabetes mellitus with other diabetic ophthalmic complication: Secondary | ICD-10-CM | POA: Diagnosis not present

## 2013-11-21 DIAGNOSIS — E11349 Type 2 diabetes mellitus with severe nonproliferative diabetic retinopathy without macular edema: Secondary | ICD-10-CM | POA: Diagnosis not present

## 2013-11-28 DIAGNOSIS — I10 Essential (primary) hypertension: Secondary | ICD-10-CM | POA: Diagnosis not present

## 2013-11-28 DIAGNOSIS — R21 Rash and other nonspecific skin eruption: Secondary | ICD-10-CM | POA: Diagnosis not present

## 2013-11-28 DIAGNOSIS — E785 Hyperlipidemia, unspecified: Secondary | ICD-10-CM | POA: Diagnosis not present

## 2013-11-28 DIAGNOSIS — I2510 Atherosclerotic heart disease of native coronary artery without angina pectoris: Secondary | ICD-10-CM | POA: Diagnosis not present

## 2013-11-28 DIAGNOSIS — E1139 Type 2 diabetes mellitus with other diabetic ophthalmic complication: Secondary | ICD-10-CM | POA: Diagnosis not present

## 2013-11-28 DIAGNOSIS — M79609 Pain in unspecified limb: Secondary | ICD-10-CM | POA: Diagnosis not present

## 2013-11-28 DIAGNOSIS — Z6829 Body mass index (BMI) 29.0-29.9, adult: Secondary | ICD-10-CM | POA: Diagnosis not present

## 2013-12-12 DIAGNOSIS — L259 Unspecified contact dermatitis, unspecified cause: Secondary | ICD-10-CM | POA: Diagnosis not present

## 2013-12-18 DIAGNOSIS — D510 Vitamin B12 deficiency anemia due to intrinsic factor deficiency: Secondary | ICD-10-CM | POA: Diagnosis not present

## 2014-01-02 DIAGNOSIS — L821 Other seborrheic keratosis: Secondary | ICD-10-CM | POA: Diagnosis not present

## 2014-01-02 DIAGNOSIS — L259 Unspecified contact dermatitis, unspecified cause: Secondary | ICD-10-CM | POA: Diagnosis not present

## 2014-01-16 ENCOUNTER — Ambulatory Visit (INDEPENDENT_AMBULATORY_CARE_PROVIDER_SITE_OTHER): Payer: Medicare Other | Admitting: Cardiology

## 2014-01-16 ENCOUNTER — Encounter: Payer: Self-pay | Admitting: Cardiology

## 2014-01-16 VITALS — BP 125/67 | HR 70 | Ht 67.0 in | Wt 197.0 lb

## 2014-01-16 DIAGNOSIS — I10 Essential (primary) hypertension: Secondary | ICD-10-CM

## 2014-01-16 DIAGNOSIS — I2510 Atherosclerotic heart disease of native coronary artery without angina pectoris: Secondary | ICD-10-CM | POA: Diagnosis not present

## 2014-01-16 DIAGNOSIS — Z952 Presence of prosthetic heart valve: Secondary | ICD-10-CM

## 2014-01-16 DIAGNOSIS — E119 Type 2 diabetes mellitus without complications: Secondary | ICD-10-CM

## 2014-01-16 DIAGNOSIS — Z954 Presence of other heart-valve replacement: Secondary | ICD-10-CM | POA: Diagnosis not present

## 2014-01-16 DIAGNOSIS — Z951 Presence of aortocoronary bypass graft: Secondary | ICD-10-CM | POA: Diagnosis not present

## 2014-01-16 NOTE — Assessment & Plan Note (Signed)
The patient is not having any symptoms from his prosthetic aortic valve.  No chest pain or shortness of breath.  No dizziness or syncope.

## 2014-01-16 NOTE — Progress Notes (Signed)
Nathan [name] Date of Birth:  Jul 02, 1946 [facility] [address] [place], [place]  [address] [phone]        Fax   [phone]   History of Present Illness: This pleasant 68 year old gentleman is seen for a scheduled four-month followup office visit. He has a history of coronary artery bypass graft surgery and aortic valve replacement for severe aortic stenosis in April 2010. He has a tissue valve. His last echocardiogram 12/02/09 showed a normally functioning prosthetic aortic valve and showed normal LV systolic function with grade 1 diastolic dysfunction. The patient has a history of high blood pressure, diabetes, and hypercholesterolemia. Also has a history of nightmares and night terrors.  The patient continues to work in a [employer]. He intends to work for another several years until his wife retires from her work.  Since last visit he has been started on allopurinol after having an episode of symptomatic gout of his great toe.  The patient has had some recent problems with his left foot.  He had a severe injury to the left ankle in childhood.  He has been seeing a podiatrist.  He does not feel like he is progressing.  He is considering getting a second opinion.   Current Outpatient Prescriptions  Medication Sig Dispense Refill  . allopurinol (ZYLOPRIM) 300 MG tablet Take 300 mg by mouth daily.      Marland Kitchen ALPRAZolam (XANAX) 0.25 MG tablet Take 0.25 mg by mouth daily as needed for anxiety.       Marland Kitchen amLODipine (NORVASC) 5 MG tablet Take 5 mg by mouth every morning.       Marland Kitchen aspirin 325 MG tablet Take 325 mg by mouth daily.        . Cholecalciferol (VITAMIN D3) 1000 UNITS capsule Take 2,000 Units by mouth daily.       Marland Kitchen co-enzyme Q-10 30 MG capsule Take 100 mg by mouth 2 (two) times daily.      . Cyanocobalamin (VITAMIN B-12 IJ) Inject as directed.      . doxycycline (VIBRAMYCIN) 100 MG capsule Take 100 mg by mouth 2 (two) times daily. For 7 days for dentist  appointments      . fish oil-omega-3 fatty acids 1000 MG capsule Take 1 g by mouth daily.        Marland Kitchen glipiZIDE (GLUCOTROL) 5 MG tablet Take 5 mg by mouth every morning.       . hydrochlorothiazide (MICROZIDE) 12.5 MG capsule Take 12.5 mg by mouth daily.      Marland Kitchen HYDROcodone-acetaminophen (VICODIN) 5-500 MG per tablet Take 0.5 tablets by mouth every 6 (six) hours as needed for pain.      Marland Kitchen insulin glargine (LANTUS SOLOSTAR) 100 UNIT/ML injection Inject 14 Units into the skin every morning.       . latanoprost (XALATAN) 0.005 % ophthalmic solution Place 1 drop into both eyes at bedtime.        . metFORMIN (GLUCOPHAGE) 1000 MG tablet Take 1,000 mg by mouth 2 (two) times daily with a meal.        . metoprolol (LOPRESSOR) 50 MG tablet Take by mouth 2 (two) times daily.        . minoxidil (LONITEN) 2.5 MG tablet Take 2.5 mg by mouth at bedtime.       . Multiple Vitamin (MULTIVITAMIN) tablet Take 1 tablet by mouth daily.        Marland Kitchen olmesartan (BENICAR) 20 MG tablet Take 20 mg by mouth  every morning.      Marland Kitchen omeprazole (PRILOSEC) 40 MG capsule TAKE ONE CAPSULE BY MOUTH TWICE DAILY   60 capsule  2  . oxyCODONE-acetaminophen (PERCOCET/ROXICET) 5-325 MG per tablet Take 1 tablet by mouth every 4 (four) hours as needed.  30 tablet  0  . oxymetazoline (AFRIN) 0.05 % nasal spray Place 2 sprays into the nose 2 (two) times daily as needed for congestion.      . rosuvastatin (CRESTOR) 5 MG tablet Take 5 mg by mouth every morning.        No current facility-administered medications for this visit.    Allergies  Allergen Reactions  . Shellfish-Derived Products Anaphylaxis  . Zolpidem Tartrate Other (See Comments)    Hallucinations    Patient Active Problem List   Diagnosis Date Noted  . Benign hypertensive heart disease without heart failure 10/13/2010    Priority: High  . Hx of CABG 10/13/2010    Priority: Medium  .  umbilical hernia repair open with Ventralex mesh Oct 2014 04/10/2013  . Other general  symptoms  01/06/2011  . Personal history of colonic polyps 01/06/2011  . Iron deficiency anemia 01/06/2011  . Gastritis, chronic 01/06/2011  . Systolic hypertension   . Sinus bradycardia   . CAD (coronary artery disease)   . History of prosthetic aortic valve   . History of diabetic retinopathy   . History of necrotizing fascIItis   . Postoperative anemia   . Nightmares   . B12 DEFICIENCY 12/02/2009  . ESOPHAGEAL REFLUX 11/19/2009  . CHEST PAIN 11/19/2009  . BLOOD IN STOOL, OCCULT 11/19/2009  . ANEMIA-IRON DEFICIENCY 08/19/2008  . AORTIC STENOSIS 08/19/2008  . GI BLEED 08/19/2008  . DIABETES MELLITUS, TYPE II, MILD 08/14/2008  . DYSLIPIDEMIA 08/14/2008  . ANXIETY 08/14/2008  . OBSTRUCTIVE SLEEP APNEA 08/14/2008  . GLAUCOMA 08/14/2008  . HYPERTENSION 08/14/2008  . PHARYNGITIS 08/14/2008  . GASTRIC ULCER 08/14/2008  . GASTRITIS 08/14/2008  . DIVERTICULOSIS, COLON 08/14/2008    History  Smoking status  . Never Smoker   Smokeless tobacco  . Never Used    History  Alcohol Use No    Family History  Problem Relation Age of Onset  . Cirrhosis Father   . Alcohol abuse Father   . Rectal cancer Mother   . Diabetes Mother   . Hypertension Sister   . Diabetes Sister     Review of Systems: Constitutional: no fever chills diaphoresis or fatigue or change in weight.  Head and neck: no hearing loss, no epistaxis, no photophobia or visual disturbance. Respiratory: No cough, shortness of breath or wheezing. Cardiovascular: No chest pain peripheral edema, palpitations. Gastrointestinal: No abdominal distention, no abdominal pain, no change in bowel habits hematochezia or melena. Genitourinary: No dysuria, no frequency, no urgency, no nocturia. Musculoskeletal:No arthralgias, no back pain, no gait disturbance or myalgias. Neurological: No dizziness, no headaches, no numbness, no seizures, no syncope, no weakness, no tremors. Hematologic: No lymphadenopathy, no easy  bruising. Psychiatric: No confusion, no hallucinations, no sleep disturbance.    Physical Exam: Filed Vitals:   01/16/14 0840  BP: 125/67  Pulse: 70   the general appearance reveals a well-developed well-nourished gentleman in no distress.The head and neck exam reveals pupils equal and reactive.  Extraocular movements are full.  There is no scleral icterus.  The mouth and pharynx are normal.  The neck is supple.  The carotids reveal no bruits.  The jugular venous pressure is normal.  The  thyroid is not enlarged.  There is no lymphadenopathy.  The chest is clear to percussion and auscultation.  There are no rales or rhonchi.  Expansion of the chest is symmetrical.  The precordium is quiet.  The first heart sound is normal.  The second heart sound is physiologically split.  There is grade 2/6 systolic ejection murmur at the aortic area.  No aortic insufficiency.  No gallop.  There is no abnormal lift or heave.  The abdomen is soft and nontender.  The bowel sounds are normal.  The liver and spleen are not enlarged.  There are no abdominal masses.  There are no abdominal bruits.  Extremities reveal good pedal pulses.  There is no phlebitis or edema.  There is no cyanosis or clubbing.  Strength is normal and symmetrical in all extremities.  There is no lateralizing weakness.  There are no sensory deficits.  The skin is warm and dry.  There is no rash.    Assessment / Plan: 1. ischemic heart disease status post CABG April 2010 2. valvular heart disease status post bioprosthetic aortic valve replacement for severe aortic stenosis in April 2010. 3. diabetes mellitus 4. Hypercholesterolemia 5. hypertensive heart disease without heart failure  Plan: Continue same medication.  Recheck in 4 months for office visit and EKG

## 2014-01-16 NOTE — Assessment & Plan Note (Signed)
No hypoglycemic episodes. 

## 2014-01-16 NOTE — Patient Instructions (Signed)
Your physician recommends that you continue on your current medications as directed. Please refer to the Current Medication list given to you today.  Your physician wants you to follow-up in: 4 month ov/ekg  You will receive a reminder letter in the mail two months in advance. If you don't receive a letter, please call our office to schedule the follow-up appointment.  

## 2014-01-16 NOTE — Assessment & Plan Note (Signed)
The patient denies any palpitations.  Blood pressure is remaining stable on current therapy

## 2014-01-22 DIAGNOSIS — M19079 Primary osteoarthritis, unspecified ankle and foot: Secondary | ICD-10-CM | POA: Diagnosis not present

## 2014-01-22 DIAGNOSIS — E1149 Type 2 diabetes mellitus with other diabetic neurological complication: Secondary | ICD-10-CM | POA: Diagnosis not present

## 2014-01-22 DIAGNOSIS — M7989 Other specified soft tissue disorders: Secondary | ICD-10-CM | POA: Diagnosis not present

## 2014-01-23 DIAGNOSIS — D510 Vitamin B12 deficiency anemia due to intrinsic factor deficiency: Secondary | ICD-10-CM | POA: Diagnosis not present

## 2014-01-29 DIAGNOSIS — M7989 Other specified soft tissue disorders: Secondary | ICD-10-CM | POA: Diagnosis not present

## 2014-01-29 DIAGNOSIS — E1149 Type 2 diabetes mellitus with other diabetic neurological complication: Secondary | ICD-10-CM | POA: Diagnosis not present

## 2014-01-29 DIAGNOSIS — M19079 Primary osteoarthritis, unspecified ankle and foot: Secondary | ICD-10-CM | POA: Diagnosis not present

## 2014-02-13 DIAGNOSIS — M7989 Other specified soft tissue disorders: Secondary | ICD-10-CM | POA: Diagnosis not present

## 2014-02-13 DIAGNOSIS — M19079 Primary osteoarthritis, unspecified ankle and foot: Secondary | ICD-10-CM | POA: Diagnosis not present

## 2014-02-13 DIAGNOSIS — E1149 Type 2 diabetes mellitus with other diabetic neurological complication: Secondary | ICD-10-CM | POA: Diagnosis not present

## 2014-02-21 DIAGNOSIS — T148XXA Other injury of unspecified body region, initial encounter: Secondary | ICD-10-CM | POA: Diagnosis not present

## 2014-02-21 DIAGNOSIS — M25579 Pain in unspecified ankle and joints of unspecified foot: Secondary | ICD-10-CM | POA: Diagnosis not present

## 2014-02-21 DIAGNOSIS — Z6830 Body mass index (BMI) 30.0-30.9, adult: Secondary | ICD-10-CM | POA: Diagnosis not present

## 2014-02-27 DIAGNOSIS — D510 Vitamin B12 deficiency anemia due to intrinsic factor deficiency: Secondary | ICD-10-CM | POA: Diagnosis not present

## 2014-03-06 DIAGNOSIS — I10 Essential (primary) hypertension: Secondary | ICD-10-CM | POA: Diagnosis not present

## 2014-03-06 DIAGNOSIS — G4733 Obstructive sleep apnea (adult) (pediatric): Secondary | ICD-10-CM | POA: Diagnosis not present

## 2014-03-06 DIAGNOSIS — Z6836 Body mass index (BMI) 36.0-36.9, adult: Secondary | ICD-10-CM | POA: Diagnosis not present

## 2014-03-06 DIAGNOSIS — I2510 Atherosclerotic heart disease of native coronary artery without angina pectoris: Secondary | ICD-10-CM | POA: Diagnosis not present

## 2014-03-06 DIAGNOSIS — E1139 Type 2 diabetes mellitus with other diabetic ophthalmic complication: Secondary | ICD-10-CM | POA: Diagnosis not present

## 2014-03-06 DIAGNOSIS — M19079 Primary osteoarthritis, unspecified ankle and foot: Secondary | ICD-10-CM | POA: Diagnosis not present

## 2014-03-07 DIAGNOSIS — H4011X0 Primary open-angle glaucoma, stage unspecified: Secondary | ICD-10-CM | POA: Diagnosis not present

## 2014-03-07 DIAGNOSIS — H524 Presbyopia: Secondary | ICD-10-CM | POA: Diagnosis not present

## 2014-03-07 DIAGNOSIS — H5200 Hypermetropia, unspecified eye: Secondary | ICD-10-CM | POA: Diagnosis not present

## 2014-03-07 DIAGNOSIS — H52229 Regular astigmatism, unspecified eye: Secondary | ICD-10-CM | POA: Diagnosis not present

## 2014-03-27 DIAGNOSIS — E1139 Type 2 diabetes mellitus with other diabetic ophthalmic complication: Secondary | ICD-10-CM | POA: Diagnosis not present

## 2014-03-27 DIAGNOSIS — H31009 Unspecified chorioretinal scars, unspecified eye: Secondary | ICD-10-CM | POA: Diagnosis not present

## 2014-03-27 DIAGNOSIS — H2510 Age-related nuclear cataract, unspecified eye: Secondary | ICD-10-CM | POA: Diagnosis not present

## 2014-03-27 DIAGNOSIS — E11349 Type 2 diabetes mellitus with severe nonproliferative diabetic retinopathy without macular edema: Secondary | ICD-10-CM | POA: Diagnosis not present

## 2014-04-03 DIAGNOSIS — Z23 Encounter for immunization: Secondary | ICD-10-CM | POA: Diagnosis not present

## 2014-04-03 DIAGNOSIS — M76829 Posterior tibial tendinitis, unspecified leg: Secondary | ICD-10-CM | POA: Diagnosis not present

## 2014-04-03 DIAGNOSIS — D509 Iron deficiency anemia, unspecified: Secondary | ICD-10-CM | POA: Diagnosis not present

## 2014-04-09 DIAGNOSIS — H01009 Unspecified blepharitis unspecified eye, unspecified eyelid: Secondary | ICD-10-CM | POA: Diagnosis not present

## 2014-04-09 DIAGNOSIS — I10 Essential (primary) hypertension: Secondary | ICD-10-CM | POA: Diagnosis not present

## 2014-04-09 DIAGNOSIS — H2510 Age-related nuclear cataract, unspecified eye: Secondary | ICD-10-CM | POA: Diagnosis not present

## 2014-04-09 DIAGNOSIS — H18419 Arcus senilis, unspecified eye: Secondary | ICD-10-CM | POA: Diagnosis not present

## 2014-05-14 DIAGNOSIS — D510 Vitamin B12 deficiency anemia due to intrinsic factor deficiency: Secondary | ICD-10-CM | POA: Diagnosis not present

## 2014-05-15 DIAGNOSIS — M76822 Posterior tibial tendinitis, left leg: Secondary | ICD-10-CM | POA: Diagnosis not present

## 2014-05-21 ENCOUNTER — Ambulatory Visit: Payer: Medicare Other | Admitting: Cardiology

## 2014-06-09 ENCOUNTER — Ambulatory Visit (INDEPENDENT_AMBULATORY_CARE_PROVIDER_SITE_OTHER): Payer: Medicare Other | Admitting: Cardiology

## 2014-06-09 ENCOUNTER — Encounter: Payer: Self-pay | Admitting: Cardiology

## 2014-06-09 VITALS — BP 158/70 | HR 76 | Ht 67.0 in | Wt 200.0 lb

## 2014-06-09 DIAGNOSIS — Z953 Presence of xenogenic heart valve: Secondary | ICD-10-CM

## 2014-06-09 DIAGNOSIS — Z952 Presence of prosthetic heart valve: Secondary | ICD-10-CM

## 2014-06-09 DIAGNOSIS — I119 Hypertensive heart disease without heart failure: Secondary | ICD-10-CM | POA: Diagnosis not present

## 2014-06-09 DIAGNOSIS — Z954 Presence of other heart-valve replacement: Secondary | ICD-10-CM | POA: Diagnosis not present

## 2014-06-09 DIAGNOSIS — I2510 Atherosclerotic heart disease of native coronary artery without angina pectoris: Secondary | ICD-10-CM | POA: Diagnosis not present

## 2014-06-09 DIAGNOSIS — Z951 Presence of aortocoronary bypass graft: Secondary | ICD-10-CM | POA: Diagnosis not present

## 2014-06-09 MED ORDER — LOSARTAN POTASSIUM 50 MG PO TABS: 50.0000 mg | ORAL_TABLET | Freq: Every day | ORAL | Status: DC

## 2014-06-09 NOTE — Assessment & Plan Note (Signed)
The patient is not having any symptoms of overt congestive heart failure.  He does have some mild peripheral edema.  He has a grade 3/6 harsh systolic ejection murmur across the prosthetic aortic valve.  We will update his echo. The patient is not having any chest pain or shortness of breath.  No exertional dizziness.  No syncope.  No palpitations.

## 2014-06-09 NOTE — Progress Notes (Signed)
Nathan [name] Date of Birth:  [birth year] [facility] [address] [place], [place]  [address] [phone]        Fax   [phone]   History of Present Illness: This pleasant 68 year old gentleman is seen for a scheduled four-month followup office visit. He has a history of coronary artery bypass graft surgery and aortic valve replacement for severe aortic stenosis in April 2010. He has a tissue valve. His last echocardiogram 12/02/09 showed a normally functioning prosthetic aortic valve and showed normal LV systolic function with grade 1 diastolic dysfunction. The patient has a history of high blood pressure, diabetes, and hypercholesterolemia. Also has a history of nightmares and night terrors.  The patient continues to work in a [employer].  He is only working half day on most of the days. He intends to work for another several years until his wife retires from her work.  Since last visit he has been started on allopurinol after having an episode of symptomatic gout of his great toe.  The patient has had some recent problems with his left foot.  A heavy box fell on his foot several months ago.  He initially saw a podiatrist.  Subsequently he has been seeing Dr. Sharol Given  A recent x-ray showed a previously undetected fracture of one of the bones of his foot.  The patient is in a cast until February at which time Dr. Sharol Given will decide whether surgery would be indicated or not.   Current Outpatient Prescriptions  Medication Sig Dispense Refill  . allopurinol (ZYLOPRIM) 300 MG tablet Take 300 mg by mouth daily.    Marland Kitchen ALPRAZolam (XANAX) 0.25 MG tablet Take 0.25 mg by mouth daily as needed for anxiety.     Marland Kitchen amLODipine (NORVASC) 5 MG tablet Take 5 mg by mouth every morning.     Marland Kitchen aspirin 325 MG tablet Take 325 mg by mouth daily.      . Cholecalciferol (VITAMIN D3) 1000 UNITS capsule Take 2,000 Units by mouth daily.     Marland Kitchen co-enzyme Q-10 30 MG capsule Take 100 mg by mouth 2 (two)  times daily.    . Cyanocobalamin (VITAMIN B-12 IJ) Inject as directed.    . doxycycline (VIBRAMYCIN) 100 MG capsule Take 100 mg by mouth 2 (two) times daily. For 7 days for dentist appointments    . fish oil-omega-3 fatty acids 1000 MG capsule Take 1 g by mouth daily.      . fluorometholone (FML) 0.1 % ophthalmic suspension   2  . glipiZIDE (GLUCOTROL) 5 MG tablet Take 5 mg by mouth every morning.     . hydrochlorothiazide (MICROZIDE) 12.5 MG capsule Take 12.5 mg by mouth daily.    Marland Kitchen HYDROcodone-acetaminophen (VICODIN) 5-500 MG per tablet Take 0.5 tablets by mouth every 6 (six) hours as needed for pain.    Marland Kitchen insulin glargine (LANTUS SOLOSTAR) 100 UNIT/ML injection Inject 14 Units into the skin every morning.     . latanoprost (XALATAN) 0.005 % ophthalmic solution Place 1 drop into both eyes at bedtime.      . metFORMIN (GLUCOPHAGE) 1000 MG tablet Take 1,000 mg by mouth 2 (two) times daily with a meal.      . metoprolol (LOPRESSOR) 50 MG tablet Take by mouth 2 (two) times daily.      . minoxidil (LONITEN) 2.5 MG tablet Take 2.5 mg by mouth at bedtime.     . Multiple Vitamin (MULTIVITAMIN) tablet Take 1 tablet by mouth daily.      Marland Kitchen  omeprazole (PRILOSEC) 40 MG capsule TAKE ONE CAPSULE BY MOUTH TWICE DAILY  60 capsule 2  . oxyCODONE-acetaminophen (PERCOCET/ROXICET) 5-325 MG per tablet Take 1 tablet by mouth every 4 (four) hours as needed. 30 tablet 0  . oxymetazoline (AFRIN) 0.05 % nasal spray Place 2 sprays into the nose 2 (two) times daily as needed for congestion.    . rosuvastatin (CRESTOR) 5 MG tablet Take 5 mg by mouth every morning.     Marland Kitchen losartan (COZAAR) 50 MG tablet Take 1 tablet (50 mg total) by mouth daily. 30 tablet 5   No current facility-administered medications for this visit.    Allergies  Allergen Reactions  . Shellfish-Derived Products Anaphylaxis  . Zolpidem Tartrate Other (See Comments)    Hallucinations    Patient Active Problem List   Diagnosis Date Noted  . Benign  hypertensive heart disease without heart failure 10/13/2010    Priority: High  . Hx of CABG 10/13/2010    Priority: Medium  .  umbilical hernia repair open with Ventralex mesh Oct 2014 04/10/2013  . Other general symptoms  01/06/2011  . Personal history of colonic polyps 01/06/2011  . Iron deficiency anemia 01/06/2011  . Gastritis, chronic 01/06/2011  . Systolic hypertension   . Sinus bradycardia   . CAD (coronary artery disease)   . History of prosthetic aortic valve   . History of diabetic retinopathy   . History of necrotizing fascIItis   . Postoperative anemia   . Nightmares   . B12 DEFICIENCY 12/02/2009  . ESOPHAGEAL REFLUX 11/19/2009  . CHEST PAIN 11/19/2009  . BLOOD IN STOOL, OCCULT 11/19/2009  . ANEMIA-IRON DEFICIENCY 08/19/2008  . AORTIC STENOSIS 08/19/2008  . GI BLEED 08/19/2008  . DIABETES MELLITUS, TYPE II, MILD 08/14/2008  . DYSLIPIDEMIA 08/14/2008  . ANXIETY 08/14/2008  . OBSTRUCTIVE SLEEP APNEA 08/14/2008  . GLAUCOMA 08/14/2008  . HYPERTENSION 08/14/2008  . PHARYNGITIS 08/14/2008  . GASTRIC ULCER 08/14/2008  . GASTRITIS 08/14/2008  . DIVERTICULOSIS, COLON 08/14/2008    History  Smoking status  . Never Smoker   Smokeless tobacco  . Never Used    History  Alcohol Use No    Family History  Problem Relation Age of Onset  . Cirrhosis Father   . Alcohol abuse Father   . Rectal cancer Mother   . Diabetes Mother   . Hypertension Sister   . Diabetes Sister     Review of Systems: Constitutional: no fever chills diaphoresis or fatigue or change in weight.  Head and neck: no hearing loss, no epistaxis, no photophobia or visual disturbance. Respiratory: No cough, shortness of breath or wheezing. Cardiovascular: No chest pain peripheral edema, palpitations. Gastrointestinal: No abdominal distention, no abdominal pain, no change in bowel habits hematochezia or melena. Genitourinary: No dysuria, no frequency, no urgency, no nocturia. Musculoskeletal:No  arthralgias, no back pain, no gait disturbance or myalgias. Neurological: No dizziness, no headaches, no numbness, no seizures, no syncope, no weakness, no tremors. Hematologic: No lymphadenopathy, no easy bruising. Psychiatric: No confusion, no hallucinations, no sleep disturbance.    Physical Exam: Filed Vitals:   06/09/14 1531  BP: 158/70  Pulse: 76   the general appearance reveals a well-developed well-nourished gentleman in no distress.The head and neck exam reveals pupils equal and reactive.  Extraocular movements are full.  There is no scleral icterus.  The mouth and pharynx are normal.  The neck is supple.  The carotids reveal no bruits.  The jugular venous pressure is normal.  The  thyroid  is not enlarged.  There is no lymphadenopathy.  The chest is clear to percussion and auscultation.  There are no rales or rhonchi.  Expansion of the chest is symmetrical.  The precordium is quiet.  The first heart sound is normal.  The second heart sound is physiologically split.  There is grade 3/6 systolic ejection murmur at the aortic area.  No aortic insufficiency.  No gallop.  There is no abnormal lift or heave.  The abdomen is soft and nontender.  The bowel sounds are normal.  The liver and spleen are not enlarged.  There are no abdominal masses.  There are no abdominal bruits.  Extremities reveal good pedal pulses.  There is no phlebitis or edema.  There is no cyanosis or clubbing.  Strength is normal and symmetrical in all extremities.  There is no lateralizing weakness.  There are no sensory deficits.  The skin is warm and dry.  There is no rash.  EKG shows normal sinus rhythm and no ischemic changes and is unchanged since 09/18/13  Assessment / Plan: 1. ischemic heart disease status post CABG April 2010 2. valvular heart disease status post bioprosthetic aortic valve replacement for severe aortic stenosis in April 2010.  The systolic heart murmur appears louder today. 3. diabetes mellitus 4.  Hypercholesterolemia 5. hypertensive heart disease without heart failure.  Blood pressure higher because no Benicar for several weeks  Plan: Continue same medication.  Change from Benicar to losartan 50 mg daily.  Recheck in 4 months for office visit.  Update 2-D echo

## 2014-06-09 NOTE — Patient Instructions (Signed)
STOP BENICAR  START LOSARTAN 55 MG DAILY  Your physician has requested that you have an echocardiogram. Echocardiography is a painless test that uses sound waves to create images of your heart. It provides your doctor with information about the size and shape of your heart and how well your heart's chambers and valves are working. This procedure takes approximately one hour. There are no restrictions for this procedure.  Your physician recommends that you schedule a follow-up appointment in: [place]

## 2014-06-09 NOTE — Assessment & Plan Note (Signed)
The patient states that the cost of Benicar became extremely high.  Therefore he had to stop taking it.  This accounts for his elevated blood pressure today.  We will start him on losartan 50 mg daily.

## 2014-06-11 DIAGNOSIS — I10 Essential (primary) hypertension: Secondary | ICD-10-CM | POA: Diagnosis not present

## 2014-06-11 DIAGNOSIS — E1139 Type 2 diabetes mellitus with other diabetic ophthalmic complication: Secondary | ICD-10-CM | POA: Diagnosis not present

## 2014-06-11 DIAGNOSIS — E785 Hyperlipidemia, unspecified: Secondary | ICD-10-CM | POA: Diagnosis not present

## 2014-06-11 DIAGNOSIS — I2510 Atherosclerotic heart disease of native coronary artery without angina pectoris: Secondary | ICD-10-CM | POA: Diagnosis not present

## 2014-06-11 DIAGNOSIS — Z6830 Body mass index (BMI) 30.0-30.9, adult: Secondary | ICD-10-CM | POA: Diagnosis not present

## 2014-06-11 DIAGNOSIS — Z1389 Encounter for screening for other disorder: Secondary | ICD-10-CM | POA: Diagnosis not present

## 2014-06-11 DIAGNOSIS — M25572 Pain in left ankle and joints of left foot: Secondary | ICD-10-CM | POA: Diagnosis not present

## 2014-06-13 ENCOUNTER — Ambulatory Visit (HOSPITAL_COMMUNITY): Payer: Medicare Other | Attending: Cardiology | Admitting: Radiology

## 2014-06-13 DIAGNOSIS — Z48812 Encounter for surgical aftercare following surgery on the circulatory system: Secondary | ICD-10-CM | POA: Insufficient documentation

## 2014-06-13 DIAGNOSIS — I359 Nonrheumatic aortic valve disorder, unspecified: Secondary | ICD-10-CM | POA: Diagnosis not present

## 2014-06-13 DIAGNOSIS — Z952 Presence of prosthetic heart valve: Secondary | ICD-10-CM | POA: Diagnosis not present

## 2014-06-13 NOTE — Progress Notes (Signed)
Echocardiogram performed.  

## 2014-06-17 ENCOUNTER — Telehealth: Payer: Self-pay | Admitting: *Deleted

## 2014-06-17 NOTE — Telephone Encounter (Signed)
-----   Message from Darlin Coco, MD sent at 06/17/2014 12:36 PM EST ----- Please report.  The echocardiogram is satisfactory.  The prosthetic valve is working well.  Left ventricular function is normal.  Continue current medication.  Send a copy of echo to Dr. Leanna Battles

## 2014-06-17 NOTE — Telephone Encounter (Signed)
Advised patient and will send to Dr Philip Aspen

## 2014-06-18 DIAGNOSIS — D510 Vitamin B12 deficiency anemia due to intrinsic factor deficiency: Secondary | ICD-10-CM | POA: Diagnosis not present

## 2014-07-22 DIAGNOSIS — H25011 Cortical age-related cataract, right eye: Secondary | ICD-10-CM | POA: Diagnosis not present

## 2014-07-22 DIAGNOSIS — H2511 Age-related nuclear cataract, right eye: Secondary | ICD-10-CM | POA: Diagnosis not present

## 2014-07-23 DIAGNOSIS — H2512 Age-related nuclear cataract, left eye: Secondary | ICD-10-CM | POA: Diagnosis not present

## 2014-07-24 DIAGNOSIS — M76822 Posterior tibial tendinitis, left leg: Secondary | ICD-10-CM | POA: Diagnosis not present

## 2014-07-24 DIAGNOSIS — M25571 Pain in right ankle and joints of right foot: Secondary | ICD-10-CM | POA: Diagnosis not present

## 2014-08-13 DIAGNOSIS — E611 Iron deficiency: Secondary | ICD-10-CM | POA: Diagnosis not present

## 2014-08-21 DIAGNOSIS — E11359 Type 2 diabetes mellitus with proliferative diabetic retinopathy without macular edema: Secondary | ICD-10-CM | POA: Diagnosis not present

## 2014-08-21 DIAGNOSIS — E11349 Type 2 diabetes mellitus with severe nonproliferative diabetic retinopathy without macular edema: Secondary | ICD-10-CM | POA: Diagnosis not present

## 2014-08-21 DIAGNOSIS — H4011X3 Primary open-angle glaucoma, severe stage: Secondary | ICD-10-CM | POA: Diagnosis not present

## 2014-08-28 ENCOUNTER — Other Ambulatory Visit: Payer: Self-pay

## 2014-08-28 DIAGNOSIS — L821 Other seborrheic keratosis: Secondary | ICD-10-CM | POA: Diagnosis not present

## 2014-08-28 DIAGNOSIS — D225 Melanocytic nevi of trunk: Secondary | ICD-10-CM | POA: Diagnosis not present

## 2014-08-28 DIAGNOSIS — L309 Dermatitis, unspecified: Secondary | ICD-10-CM | POA: Diagnosis not present

## 2014-08-28 DIAGNOSIS — D485 Neoplasm of uncertain behavior of skin: Secondary | ICD-10-CM | POA: Diagnosis not present

## 2014-09-17 DIAGNOSIS — D510 Vitamin B12 deficiency anemia due to intrinsic factor deficiency: Secondary | ICD-10-CM | POA: Diagnosis not present

## 2014-09-23 ENCOUNTER — Ambulatory Visit (INDEPENDENT_AMBULATORY_CARE_PROVIDER_SITE_OTHER): Payer: Medicare Other | Admitting: Cardiology

## 2014-09-23 ENCOUNTER — Encounter: Payer: Self-pay | Admitting: Cardiology

## 2014-09-23 VITALS — BP 122/60 | HR 75 | Ht 67.0 in | Wt 197.8 lb

## 2014-09-23 DIAGNOSIS — Z954 Presence of other heart-valve replacement: Secondary | ICD-10-CM

## 2014-09-23 DIAGNOSIS — I119 Hypertensive heart disease without heart failure: Secondary | ICD-10-CM

## 2014-09-23 DIAGNOSIS — Z952 Presence of prosthetic heart valve: Secondary | ICD-10-CM

## 2014-09-23 DIAGNOSIS — Z951 Presence of aortocoronary bypass graft: Secondary | ICD-10-CM

## 2014-09-23 NOTE — Patient Instructions (Signed)
Your physician recommends that you continue on your current medications as directed. Please refer to the Current Medication list given to you today.  Your physician wants you to follow-up in: 4 month ov You will receive a reminder letter in the mail two months in advance. If you don't receive a letter, please call our office to schedule the follow-up appointment.  

## 2014-09-23 NOTE — Progress Notes (Signed)
Cardiology Office Note   Date:  09/23/2014   ID:  Nathan [name], DOB Oct 04, 1945, MRN [id number]  PCP:  Donnajean Lopes, MD  Cardiologist:    Darlin Coco, MD   No chief complaint on file.     History of Present Illness: Nathan [name] is a 69 y.o. male who presents for scheduled four-month follow-up office visit.  This pleasant 69 year old gentleman is seen for a scheduled four-month followup office visit. He has a history of coronary artery bypass graft surgery and aortic valve replacement for severe aortic stenosis in April 2010. He has a tissue valve.  His last echocardiogram on 06/13/14 showed normal aortic valve prosthetic function and normal left ventricular systolic function with ejection fraction of 55-60%.  The patient has a history of high blood pressure, diabetes, and hypercholesterolemia. Also has a history of nightmares and night terrors.  The patient continues to work in a [employer]. He is only working half day on most of the days. He intends to work for another several years until his wife retires from her work.  Since last visit he has been started on allopurinol after having an episode of symptomatic gout of his great toe.  The patient has had some recent problems with his left foot. A heavy box fell on his foot several months ago. He initially saw a podiatrist. Subsequently he has been seeing Dr. Sharol Given A recent x-ray showed a previously undetected fracture of one of the bones of his foot. The patient was placed in a walking boot for a while and improved did not have to have surgery for which he is thankful.  Since last visit the patient has been feeling well from a cardiac standpoint.  No chest pain or shortness of breath.  He is not having any hypoglycemic episodes.  He has had no further gout.  He is not having any myalgias from his Crestor. Last visit he had successful eye surgery by Dr. Lucita Ferrara.  Past Medical History  Diagnosis Date  . Systolic  hypertension   . Sinus bradycardia   . CAD (coronary artery disease)   . History of prosthetic aortic valve   . Aortic stenosis   . History of gastrointestinal bleeding   . Hypercholesteremia   . Diabetes mellitus, type 2   . History of diabetic retinopathy   . History of necrotizing fasciitis   . Postoperative anemia   . Nightmares     CHRONIC  . Vitamin B12 deficiency   . Diverticulosis of colon (without mention of hemorrhage)   . Personal history of colonic polyps 11/23/2009    TUBULAR ADENOMA  . Flesh-eating bacteria 1995  . Stomach ulcer     Hx of  . Blood transfusion without reported diagnosis   . Heart murmur   . OSA (obstructive sleep apnea)     borderline no CPAP   . GERD (gastroesophageal reflux disease)   . Arthritis     Past Surgical History  Procedure Laterality Date  . Retinal laser procedure    . Coronary artery bypass graft    . Scrotal surgery  1995    resection  . Hernia repair  1972  . Aortic valve replacement    . Flesh eating disease surgery       surgeries x 7   . Umbilical hernia repair N/A 04/10/2013    Procedure: HERNIA REPAIR UMBILICAL ADULT with mesh;  Surgeon: Pedro Earls, MD;  Location: [facility];  Service: General;  Laterality: N/A;  Current Outpatient Prescriptions  Medication Sig Dispense Refill  . allopurinol (ZYLOPRIM) 300 MG tablet Take 300 mg by mouth daily.    Marland Kitchen ALPRAZolam (XANAX) 0.25 MG tablet Take 0.25 mg by mouth daily as needed for anxiety.     Marland Kitchen amLODipine (NORVASC) 5 MG tablet Take 5 mg by mouth every morning.     Marland Kitchen aspirin 325 MG tablet Take 325 mg by mouth daily.      . Cholecalciferol (VITAMIN D3) 1000 UNITS capsule Take 2,000 Units by mouth daily.     Marland Kitchen co-enzyme Q-10 30 MG capsule Take 100 mg by mouth 2 (two) times daily.    . Cyanocobalamin (VITAMIN B-12 IJ) Inject as directed.    . cycloSPORINE (RESTASIS) 0.05 % ophthalmic emulsion Place 1 drop into both eyes 2 (two) times daily.    . fish oil-omega-3 fatty  acids 1000 MG capsule Take 1 g by mouth daily.      . fluorometholone (FML) 0.1 % ophthalmic suspension   2  . glipiZIDE (GLUCOTROL) 5 MG tablet Take 5 mg by mouth every morning.     . hydrochlorothiazide (MICROZIDE) 12.5 MG capsule Take 12.5 mg by mouth daily.    Marland Kitchen HYDROcodone-acetaminophen (VICODIN) 5-500 MG per tablet Take 0.5 tablets by mouth every 6 (six) hours as needed for pain.    Marland Kitchen insulin glargine (LANTUS SOLOSTAR) 100 UNIT/ML injection Inject 14 Units into the skin every morning.     . latanoprost (XALATAN) 0.005 % ophthalmic solution Place 1 drop into both eyes at bedtime.      Marland Kitchen losartan (COZAAR) 50 MG tablet Take 1 tablet (50 mg total) by mouth daily. 30 tablet 5  . metFORMIN (GLUCOPHAGE) 1000 MG tablet Take 1,000 mg by mouth 2 (two) times daily with a meal.      . metoprolol (LOPRESSOR) 50 MG tablet Take by mouth 2 (two) times daily.      . minoxidil (LONITEN) 2.5 MG tablet Take 2.5 mg by mouth at bedtime.     . Multiple Vitamin (MULTIVITAMIN) tablet Take 1 tablet by mouth daily.      Marland Kitchen omeprazole (PRILOSEC) 40 MG capsule TAKE ONE CAPSULE BY MOUTH TWICE DAILY  60 capsule 2  . oxyCODONE-acetaminophen (PERCOCET/ROXICET) 5-325 MG per tablet Take 1 tablet by mouth every 4 (four) hours as needed. 30 tablet 0  . oxymetazoline (AFRIN) 0.05 % nasal spray Place 2 sprays into the nose 2 (two) times daily as needed for congestion.    . rosuvastatin (CRESTOR) 5 MG tablet Take 5 mg by mouth every morning.     Marland Kitchen doxycycline (VIBRAMYCIN) 100 MG capsule Take 100 mg by mouth 2 (two) times daily. For 7 days for dentist appointments     No current facility-administered medications for this visit.    Allergies:   Shellfish-derived products and Zolpidem tartrate    Social History:  The patient  reports that he has never smoked. He has never used smokeless tobacco. He reports that he does not drink alcohol or use illicit drugs.   Family History:  The patient's family history includes Alcohol abuse  in his father; Cirrhosis in his father; Diabetes in his mother and sister; Hypertension in his sister; Rectal cancer in his mother.    ROS:  Please see the history of present illness.   Otherwise, review of systems are positive for none.   All other systems are reviewed and negative.    PHYSICAL EXAM: VS:  BP 122/60 mmHg  Pulse 75  Ht  5\' 7"  (1.702 m)  Wt 197 lb 12.8 oz (89.721 kg)  BMI 30.97 kg/m2  SpO2 95% , BMI Body mass index is 30.97 kg/(m^2). GEN: Well nourished, well developed, in no acute distress HEENT: normal Neck: no JVD, carotid bruits, or masses Cardiac: RRR; there is a grade 2/6 systolic ejection murmur at the base which transmits to the neck there is no gallop or rub. Respiratory:  clear to auscultation bilaterally, normal work of breathing GI: soft, nontender, nondistended, + BS MS: no deformity or atrophy Skin: warm and dry, no rash Neuro:  Strength and sensation are intact Psych: euthymic mood, full affect   EKG:  EKG is not ordered today.    Recent Labs: No results found for requested labs within last 365 days.    Lipid Panel No results found for: CHOL, TRIG, HDL, CHOLHDL, VLDL, LDLCALC, LDLDIRECT    Wt Readings from Last 3 Encounters:  09/23/14 197 lb 12.8 oz (89.721 kg)  06/09/14 200 lb (90.719 kg)  01/16/14 197 lb (89.359 kg)        ASSESSMENT AND PLAN:  1.  ischemic heart disease status post CABG April 2010 2. valvular heart disease status post bioprosthetic aortic valve replacement for severe aortic stenosis in April 2010. The systolic heart murmur appears louder today. 3. diabetes mellitus 4. Hypercholesterolemia 5. hypertensive heart disease without heart failure. Good response to switch to losartan  Plan: Continue same medication.We commended him on his weight loss.  Check in 4 months for follow-up office visit and EKG.  His lipids are followed by his PCP Dr. Philip Aspen  Current medicines are reviewed at length with the patient today.   The patient does not have concerns regarding medicines.  The following changes have been made:  no change  Labs/ tests ordered today include: None  No orders of the defined types were placed in this encounter.     Disposition:   FU with Dr. Mare Ferrari in 4  months office visit and EKG   Signed, Darlin Coco, MD  09/23/2014 4:04 PM    [facility] [address], [place], [place]  [address] Phone: [phone]; Fax: [phone]

## 2014-09-30 ENCOUNTER — Other Ambulatory Visit: Payer: Self-pay | Admitting: *Deleted

## 2014-09-30 MED ORDER — LOSARTAN POTASSIUM 50 MG PO TABS: 50.0000 mg | ORAL_TABLET | Freq: Every day | ORAL | Status: DC

## 2014-10-01 DIAGNOSIS — I10 Essential (primary) hypertension: Secondary | ICD-10-CM | POA: Diagnosis not present

## 2014-10-01 DIAGNOSIS — G4733 Obstructive sleep apnea (adult) (pediatric): Secondary | ICD-10-CM | POA: Diagnosis not present

## 2014-10-01 DIAGNOSIS — E1139 Type 2 diabetes mellitus with other diabetic ophthalmic complication: Secondary | ICD-10-CM | POA: Diagnosis not present

## 2014-10-01 DIAGNOSIS — I2510 Atherosclerotic heart disease of native coronary artery without angina pectoris: Secondary | ICD-10-CM | POA: Diagnosis not present

## 2014-10-01 DIAGNOSIS — E785 Hyperlipidemia, unspecified: Secondary | ICD-10-CM | POA: Diagnosis not present

## 2014-10-01 DIAGNOSIS — Z6830 Body mass index (BMI) 30.0-30.9, adult: Secondary | ICD-10-CM | POA: Diagnosis not present

## 2014-10-08 ENCOUNTER — Encounter: Payer: Self-pay | Admitting: Internal Medicine

## 2014-10-13 DIAGNOSIS — H02102 Unspecified ectropion of right lower eyelid: Secondary | ICD-10-CM | POA: Diagnosis not present

## 2014-10-13 DIAGNOSIS — H02105 Unspecified ectropion of left lower eyelid: Secondary | ICD-10-CM | POA: Diagnosis not present

## 2014-10-22 DIAGNOSIS — D510 Vitamin B12 deficiency anemia due to intrinsic factor deficiency: Secondary | ICD-10-CM | POA: Diagnosis not present

## 2014-10-27 DIAGNOSIS — B351 Tinea unguium: Secondary | ICD-10-CM | POA: Diagnosis not present

## 2014-10-27 DIAGNOSIS — M25571 Pain in right ankle and joints of right foot: Secondary | ICD-10-CM | POA: Diagnosis not present

## 2014-10-27 DIAGNOSIS — M76822 Posterior tibial tendinitis, left leg: Secondary | ICD-10-CM | POA: Diagnosis not present

## 2014-10-27 DIAGNOSIS — M6702 Short Achilles tendon (acquired), left ankle: Secondary | ICD-10-CM | POA: Diagnosis not present

## 2014-11-10 DIAGNOSIS — H02105 Unspecified ectropion of left lower eyelid: Secondary | ICD-10-CM | POA: Diagnosis not present

## 2014-11-10 DIAGNOSIS — H02102 Unspecified ectropion of right lower eyelid: Secondary | ICD-10-CM | POA: Diagnosis not present

## 2014-11-13 ENCOUNTER — Encounter: Payer: Self-pay | Admitting: Internal Medicine

## 2014-11-24 HISTORY — PX: BLEPHAROPLASTY: SUR158

## 2014-11-26 DIAGNOSIS — D510 Vitamin B12 deficiency anemia due to intrinsic factor deficiency: Secondary | ICD-10-CM | POA: Diagnosis not present

## 2014-12-19 ENCOUNTER — Encounter: Payer: Self-pay | Admitting: Gastroenterology

## 2014-12-31 DIAGNOSIS — D510 Vitamin B12 deficiency anemia due to intrinsic factor deficiency: Secondary | ICD-10-CM | POA: Diagnosis not present

## 2015-01-01 ENCOUNTER — Ambulatory Visit (AMBULATORY_SURGERY_CENTER): Payer: Self-pay

## 2015-01-01 VITALS — Ht 67.0 in | Wt 198.0 lb

## 2015-01-01 DIAGNOSIS — Z860100 Personal history of colon polyps, unspecified: Secondary | ICD-10-CM

## 2015-01-01 MED ORDER — SUPREP BOWEL PREP KIT 17.5-3.13-1.6 GM/177ML PO SOLN: 1.0000 | Freq: Once | ORAL | Status: DC

## 2015-01-01 NOTE — Progress Notes (Signed)
No allergies to eggs or soy No diet/weight loss meds No home oxygen No past problems with anesthesia EXCEPT hallucinations with general anesthesia

## 2015-01-05 ENCOUNTER — Other Ambulatory Visit: Payer: Self-pay

## 2015-01-07 ENCOUNTER — Encounter: Payer: Self-pay | Admitting: Cardiology

## 2015-01-07 ENCOUNTER — Ambulatory Visit (INDEPENDENT_AMBULATORY_CARE_PROVIDER_SITE_OTHER): Payer: Medicare Other | Admitting: Cardiology

## 2015-01-07 VITALS — BP 130/60 | HR 67 | Ht 67.0 in | Wt 196.0 lb

## 2015-01-07 DIAGNOSIS — Z951 Presence of aortocoronary bypass graft: Secondary | ICD-10-CM | POA: Diagnosis not present

## 2015-01-07 DIAGNOSIS — I119 Hypertensive heart disease without heart failure: Secondary | ICD-10-CM | POA: Diagnosis not present

## 2015-01-07 DIAGNOSIS — Z954 Presence of other heart-valve replacement: Secondary | ICD-10-CM | POA: Diagnosis not present

## 2015-01-07 DIAGNOSIS — Z952 Presence of prosthetic heart valve: Secondary | ICD-10-CM

## 2015-01-07 NOTE — Patient Instructions (Signed)
Medication Instructions:  Your physician recommends that you continue on your current medications as directed. Please refer to the Current Medication list given to you today.  Labwork: NONE  Testing/Procedures: NONE  Follow-Up: Your physician wants you to follow-up in: [place] will receive a reminder letter in the mail two months in advance. If you don't receive a letter, please call our office to schedule the follow-up appointment.

## 2015-01-07 NOTE — Progress Notes (Signed)
Cardiology Office Note   Date:  01/07/2015   ID:  Nathan [name], DOB 06-Aug-1945, MRN [id number]  PCP:  Donnajean Lopes, MD  Cardiologist: Darlin Coco MD  Chief Complaint  Patient presents with  . Coronary Artery Disease      History of Present Illness:   Nathan [name] is a 69 y.o. male who presents for scheduled four-month follow-up office visit.  This pleasant 69 year old gentleman is seen for a scheduled four-month followup office visit. He has a history of coronary artery bypass graft surgery and aortic valve replacement for severe aortic stenosis in April 2010. He has a tissue valve. His last echocardiogram on 06/13/14 showed normal aortic valve prosthetic function and normal left ventricular systolic function with ejection fraction of 55-60%. The patient has a history of high blood pressure, diabetes, and hypercholesterolemia. Also has a history of nightmares and night terrors.  The patient continues to work in a [employer]. He is only working half day on most of the days. He intends to work for another several years until his wife retires from her work.  The patient reports that his previous left foot discomfort has resolved and he is able to walk without pain.  He sees Dr. Sharol Given. Since we last saw the patient he has had eyelid surgery for drooping eyelids.  He is scheduled to have cataract surgery on his remaining eye soon. He is diabetic.  He has not been having any hypoglycemic episodes. He reports that he had a recent unremarkable colonoscopy for routine surveillance.   Past Medical History  Diagnosis Date  . Systolic hypertension   . Sinus bradycardia   . CAD (coronary artery disease)   . History of prosthetic aortic valve   . Aortic stenosis   . History of gastrointestinal bleeding   . Hypercholesteremia   . Diabetes mellitus, type 2   . History of diabetic retinopathy   . History of necrotizing fasciitis   . Postoperative anemia   . Nightmares    CHRONIC  . Vitamin B12 deficiency   . Diverticulosis of colon (without mention of hemorrhage)   . Personal history of colonic polyps 11/23/2009    TUBULAR ADENOMA  . Flesh-eating bacteria 1995  . Stomach ulcer     Hx of  . Blood transfusion without reported diagnosis   . Heart murmur   . OSA (obstructive sleep apnea)     borderline no CPAP   . GERD (gastroesophageal reflux disease)   . Arthritis   . Duodenal ulcer perforation     blood transfusion    Past Surgical History  Procedure Laterality Date  . Retinal laser procedure    . Coronary artery bypass graft    . Scrotal surgery  1995    resection  . Hernia repair  1972  . Aortic valve replacement    . Flesh eating disease surgery       surgeries x 7   . Umbilical hernia repair N/A 04/10/2013    Procedure: HERNIA REPAIR UMBILICAL ADULT with mesh;  Surgeon: Pedro Earls, MD;  Location: [facility];  Service: General;  Laterality: N/A;  . Cataract extraction w/ intraocular lens implant      OD     Current Outpatient Prescriptions  Medication Sig Dispense Refill  . allopurinol (ZYLOPRIM) 300 MG tablet Take 300 mg by mouth daily.    Marland Kitchen ALPRAZolam (XANAX) 0.25 MG tablet Take 0.25 mg by mouth daily as needed for anxiety.     Marland Kitchen amLODipine (NORVASC)  5 MG tablet Take 5 mg by mouth every morning.     . Ascorbic Acid (VITAMIN C) 1000 MG tablet Take 1,000 mg by mouth daily.    Marland Kitchen aspirin 325 MG tablet Take 325 mg by mouth daily.      . Cholecalciferol (VITAMIN D3) 1000 UNITS capsule Take 2,000 Units by mouth daily.     Marland Kitchen co-enzyme Q-10 30 MG capsule Take 100 mg by mouth 2 (two) times daily.    . Cyanocobalamin (VITAMIN B-12 IJ) Inject as directed.    . cycloSPORINE (RESTASIS) 0.05 % ophthalmic emulsion Place 1 drop into both eyes 2 (two) times daily.    Marland Kitchen doxycycline (VIBRAMYCIN) 100 MG capsule Take 100 mg by mouth 2 (two) times daily. For 7 days for dentist appointments    . fish oil-omega-3 fatty acids 1000 MG capsule Take 1 g by  mouth daily.      Marland Kitchen glipiZIDE (GLUCOTROL) 5 MG tablet Take 5 mg by mouth every morning.     . hydrochlorothiazide (MICROZIDE) 12.5 MG capsule Take 12.5 mg by mouth daily.    Marland Kitchen HYDROcodone-acetaminophen (VICODIN) 5-500 MG per tablet Take 0.5 tablets by mouth every 6 (six) hours as needed for pain.    Marland Kitchen insulin glargine (LANTUS SOLOSTAR) 100 UNIT/ML injection Inject 16 Units into the skin every morning.     . latanoprost (XALATAN) 0.005 % ophthalmic solution Place 1 drop into both eyes at bedtime.      Marland Kitchen losartan (COZAAR) 50 MG tablet Take 1 tablet (50 mg total) by mouth daily. 90 tablet 1  . metFORMIN (GLUCOPHAGE) 1000 MG tablet Take 1,000 mg by mouth 2 (two) times daily with a meal.      . metoprolol (LOPRESSOR) 50 MG tablet Take by mouth 2 (two) times daily.      . minoxidil (LONITEN) 2.5 MG tablet Take 2.5 mg by mouth at bedtime.     . Multiple Vitamin (MULTIVITAMIN) tablet Take 1 tablet by mouth daily.      Marland Kitchen omeprazole (PRILOSEC) 40 MG capsule TAKE ONE CAPSULE BY MOUTH TWICE DAILY  (Patient taking differently: TAKE ONE CAPSULE BY MOUTH TWICE DAILY) 60 capsule 2  . oxyCODONE-acetaminophen (PERCOCET/ROXICET) 5-325 MG per tablet Take 1 tablet by mouth every 4 (four) hours as needed. 30 tablet 0  . oxymetazoline (AFRIN) 0.05 % nasal spray Place 2 sprays into the nose 2 (two) times daily as needed for congestion.    . rosuvastatin (CRESTOR) 5 MG tablet Take 5 mg by mouth every morning.     Marland Kitchen SUPREP BOWEL PREP SOLN Take 1 kit by mouth once. 354 mL 0   No current facility-administered medications for this visit.    Allergies:   Shellfish-derived products and Zolpidem tartrate    Social History:  The patient  reports that he has never smoked. He has never used smokeless tobacco. He reports that he does not drink alcohol or use illicit drugs.   Family History:  The patient's family history includes Alcohol abuse in his father; Cirrhosis in his father; Colon cancer (age of onset: 13) in his  mother; Diabetes in his mother and sister; Hypertension in his mother and sister; Rectal cancer in his mother. There is no history of Heart attack or Stroke.    ROS:  Please see the history of present illness.   Otherwise, review of systems are positive for none.   All other systems are reviewed and negative.    PHYSICAL EXAM: VS:  BP 130/60 mmHg  Pulse 67  Ht _0  (1.702 m)  Wt 196 lb (88.905 kg)  BMI 30.69 kg/m2 , BMI Body mass index is 30.69 kg/(m^2). GEN: Well nourished, well developed, in no acute distress HEENT: normal Neck: no JVD, carotid bruits, or masses Cardiac: RRR; there is a grade 2/6 systolic ejection murmur across the prosthetic aortic valve.  No diastolic murmur.  No gallop or rub. Respiratory:  clear to auscultation bilaterally, normal work of breathing GI: soft, nontender, nondistended, + BS MS: no deformity or atrophy Skin: warm and dry, no rash Neuro:  Strength and sensation are intact Psych: euthymic mood, full affect   EKG:  EKG is ordered today. The ekg ordered today demonstrates normal sinus rhythm with occasional PVCs.  No ischemic changes.  Since previous tracing of 06/09/14, PVCs are new.   Recent Labs: No results found for requested labs within last 365 days.    Lipid Panel No results found for: CHOL, TRIG, HDL, CHOLHDL, VLDL, LDLCALC, LDLDIRECT    Wt Readings from Last 3 Encounters:  01/07/15 196 lb (88.905 kg)  01/01/15 198 lb (89.812 kg)  09/23/14 197 lb 12.8 oz (89.721 kg)        ASSESSMENT AND PLAN:  1. ischemic heart disease status post CABG April 2010 2. valvular heart disease status post bioprosthetic aortic valve replacement for severe aortic stenosis in April 2010.  3. diabetes mellitus 4. Hypercholesterolemia 5. hypertensive heart disease without heart failure. Good response to switch to losartan 6.  PVCs, asymptomatic Plan: Continue same medication.We commended him on his weight loss. Check in 4 months for follow-up  office visit . His lipids are followed by his PCP Dr. Philip Aspen   Current medicines are reviewed at length with the patient today.  The patient does not have concerns regarding medicines.  The following changes have been made:  no change  Labs/ tests ordered today include:   Orders Placed This Encounter  Procedures  . EKG 12-Lead     Disposition: No change in meds.  Recheck in 4 months for office visit Signed, Darlin Coco MD 01/07/2015 1:02 PM    [facility] [place], [place], [place]  [address] Phone: [phone]; Fax: [phone]

## 2015-01-15 ENCOUNTER — Encounter: Payer: Self-pay | Admitting: Internal Medicine

## 2015-01-15 ENCOUNTER — Ambulatory Visit (AMBULATORY_SURGERY_CENTER): Payer: Medicare Other | Admitting: Internal Medicine

## 2015-01-15 VITALS — BP 104/55 | HR 56 | Temp 96.8°F | Resp 14 | Ht 67.0 in | Wt 198.0 lb

## 2015-01-15 DIAGNOSIS — D123 Benign neoplasm of transverse colon: Secondary | ICD-10-CM

## 2015-01-15 DIAGNOSIS — Z86010 Personal history of colonic polyps: Secondary | ICD-10-CM

## 2015-01-15 DIAGNOSIS — D122 Benign neoplasm of ascending colon: Secondary | ICD-10-CM | POA: Diagnosis not present

## 2015-01-15 DIAGNOSIS — I2510 Atherosclerotic heart disease of native coronary artery without angina pectoris: Secondary | ICD-10-CM | POA: Diagnosis not present

## 2015-01-15 DIAGNOSIS — E119 Type 2 diabetes mellitus without complications: Secondary | ICD-10-CM | POA: Diagnosis not present

## 2015-01-15 DIAGNOSIS — D125 Benign neoplasm of sigmoid colon: Secondary | ICD-10-CM

## 2015-01-15 DIAGNOSIS — Z1211 Encounter for screening for malignant neoplasm of colon: Secondary | ICD-10-CM | POA: Diagnosis not present

## 2015-01-15 LAB — GLUCOSE, CAPILLARY
GLUCOSE-CAPILLARY: 112 mg/dL — AB (ref 65–99)
Glucose-Capillary: 122 mg/dL — ABNORMAL HIGH (ref 65–99)

## 2015-01-15 MED ORDER — SODIUM CHLORIDE 0.9 % IV SOLN: 500.0000 mL | INTRAVENOUS | Status: DC

## 2015-01-15 NOTE — Op Note (Signed)
[facility]  [address] [place], [address]   COLONOSCOPY PROCEDURE REPORT  PATIENT: Nathan, [name]  MR#: [id number] BIRTHDATE: [birth year] , 68  yrs. old GENDER: male ENDOSCOPIST: Jerene Bears, MD REFERRED WU:XLKGM Patterson, M.D. PROCEDURE DATE:  01/15/2015 PROCEDURE:   Colonoscopy, surveillance , Colonoscopy with snare polypectomy, and Submucosal injection, any substance First Screening Colonoscopy - Avg.  risk and is 50 yrs.  old or older - No.  Prior Negative Screening - Now for repeat screening. N/A  History of Adenoma - Now for follow-up colonoscopy & has been > or = to 3 yrs.  Yes hx of adenoma.  Has been 3 or more years since last colonoscopy.  Polyps removed today? Yes ASA CLASS:   Class III INDICATIONS:Surveillance due to prior colonic neoplasia and PH Colon Adenoma. MEDICATIONS: Monitored anesthesia care and Propofol 450 mg IV  DESCRIPTION OF PROCEDURE:   After the risks benefits and alternatives of the procedure were thoroughly explained, informed consent was obtained.  The digital rectal exam revealed no rectal mass.   The LB [id number]  endoscope was introduced through the anus and advanced to the cecum, which was identified by both the appendix and ileocecal valve. No adverse events experienced. The quality of the prep was good.  (Suprep was used)  The instrument was then slowly withdrawn as the colon was fully examined. Estimated blood loss is zero unless otherwise noted in this procedure report.     COLON FINDINGS: A flat polyp ranging from 20 to 69mm in size was found in the proximal transverse colon.  Endoscopic mucosal resection was performed in a piecemeal fashion by injecting saline (9cc) into the submucosa to raise the lesion.  Resection was then performed with snare cautery.  The polyp lifted well after submucosal injection. There was no blood loss from polypectomy.  A tattoo was applied around the polypectomy margins with  Niger Ink (3 cc).   Four sessile polyps ranging from 5 to 2mm in size were found in the ascending colon (1), transverse colon (2), and sigmoid colon (1).  Polypectomies were performed with a cold snare.  The resection was complete, the polyp tissue was completely retrieved and sent to histology.   There was moderate diverticulosis noted throughout the entire examined colon with associated muscular hypertrophy.  Retroflexed views revealed external hemorrhoids. The time to cecum = 1.5 Withdrawal time = 34.7   The scope was withdrawn and the procedure completed.  COMPLICATIONS: There were no immediate complications.  ENDOSCOPIC IMPRESSION: 1.   Flat polyp ranging from 20 to 50mm in size was found in the proximal transverse colon; endoscopic mucosal resection was performed; a tattoo was applied 2.   Four sessile polyps ranging from 5 to 51mm in size were found in the ascending colon, transverse colon, and sigmoid colon; polypectomies were performed with a cold snare 3.   There was moderate diverticulosis noted throughout the entire examined colon  RECOMMENDATIONS: 1.  Hold additional aspirin and all other NSAIDs for 2-3 weeks. 2.  Await pathology results 3.  High fiber diet 4.  Timing of repeat colonoscopy will be determined by pathology findings, likely 3-6 months to ensure complete resection. 5.  You will receive a letter within 1-2 weeks with the results of your biopsy as well as final recommendations.  Please call my office if you have not received a letter after 3 weeks.  eSigned:  Jerene Bears, MD 01/15/2015 9:57 AM   cc: Leanna Battles, MD and  The Patient   PATIENT NAME:  Nathan [name], Nathan [name] MR#: [id number]

## 2015-01-15 NOTE — Progress Notes (Signed)
Patient awakening,vss,report to rn 

## 2015-01-15 NOTE — Patient Instructions (Addendum)
YOU HAD AN ENDOSCOPIC PROCEDURE TODAY AT [facility]:   Refer to the procedure report that was given to you for any specific questions about what was found during the examination.  If the procedure report does not answer your questions, please call your gastroenterologist to clarify.  If you requested that your care partner not be given the details of your procedure findings, then the procedure report has been included in a sealed envelope for you to review at your convenience later.  YOU SHOULD EXPECT: Some feelings of bloating in the abdomen. Passage of more gas than usual.  Walking can help get rid of the air that was put into your GI tract during the procedure and reduce the bloating. If you had a lower endoscopy (such as a colonoscopy or flexible sigmoidoscopy) you may notice spotting of blood in your stool or on the toilet paper. If you underwent a bowel prep for your procedure, you may not have a normal bowel movement for a few days.  Please Note:  You might notice some irritation and congestion in your nose or some drainage.  This is from the oxygen used during your procedure.  There is no need for concern and it should clear up in a day or so.  SYMPTOMS TO REPORT IMMEDIATELY:   Following lower endoscopy (colonoscopy or flexible sigmoidoscopy):  Excessive amounts of blood in the stool  Significant tenderness or worsening of abdominal pains  Swelling of the abdomen that is new, acute  Fever of 100F or higher  For urgent or emergent issues, a gastroenterologist can be reached at any hour by calling [phone].   DIET: Your first meal following the procedure should be a small meal and then it is ok to progress to your normal diet. Heavy or fried foods are harder to digest and may make you feel nauseous or bloated.  Likewise, meals heavy in dairy and vegetables can increase bloating.  Drink plenty of fluids but you should avoid alcoholic beverages for 24  hours.  ACTIVITY:  You should plan to take it easy for the rest of today and you should NOT DRIVE or use heavy machinery until tomorrow (because of the sedation medicines used during the test).    FOLLOW UP: Our staff will call the number listed on your records the next business day following your procedure to check on you and address any questions or concerns that you may have regarding the information given to you following your procedure. If we do not reach you, we will leave a message.  However, if you are feeling well and you are not experiencing any problems, there is no need to return our call.  We will assume that you have returned to your regular daily activities without incident.  If any biopsies were taken you will be contacted by phone or by letter within the next 1-3 weeks.  Please call us at [phone] if you have not heard about the biopsies in 3 weeks.    SIGNATURES/CONFIDENTIALITY: You and/or your care partner have signed paperwork which will be entered into your electronic medical record.  These signatures attest to the fact that that the information above on your After Visit Summary has been reviewed and is understood.  Full responsibility of the confidentiality of this discharge information lies with you and/or your care-partner.  Polyps, diverticulosis, high fiber diet-handouts given  Repeat colonoscopy will be determined by pathology.  Hold additional aspirin, aspirin products and NSAIDS (ibuprofen, motrin, advil,  aleve, naproxen, etc) for 2 weeks.  Only take half of your 325 mg aspirin or 2 baby aspirin daily for 2 weeks.

## 2015-01-15 NOTE — Progress Notes (Signed)
Called to room to assist during endoscopic procedure.  Patient ID and intended procedure confirmed with present staff. Received instructions for my participation in the procedure from the performing physician.  

## 2015-01-16 ENCOUNTER — Telehealth: Payer: Self-pay | Admitting: *Deleted

## 2015-01-16 DIAGNOSIS — Z6829 Body mass index (BMI) 29.0-29.9, adult: Secondary | ICD-10-CM | POA: Diagnosis not present

## 2015-01-16 DIAGNOSIS — I10 Essential (primary) hypertension: Secondary | ICD-10-CM | POA: Diagnosis not present

## 2015-01-16 DIAGNOSIS — E1139 Type 2 diabetes mellitus with other diabetic ophthalmic complication: Secondary | ICD-10-CM | POA: Diagnosis not present

## 2015-01-16 DIAGNOSIS — I2510 Atherosclerotic heart disease of native coronary artery without angina pectoris: Secondary | ICD-10-CM | POA: Diagnosis not present

## 2015-01-16 NOTE — Telephone Encounter (Signed)
  Follow up Call-  Call back number 01/15/2015  Post procedure Call Back phone  # [phone]  Permission to leave phone message Yes     Patient questions:  Do you have a fever, pain , or abdominal swelling? No. Pain Score  0 *  Have you tolerated food without any problems? Yes.    Have you been able to return to your normal activities? Yes.    Do you have any questions about your discharge instructions: Diet   No. Medications  No. Follow up visit  No.  Do you have questions or concerns about your Care? No.  Actions: * If pain score is 4 or above: No action needed, pain <4.

## 2015-01-19 DIAGNOSIS — H2512 Age-related nuclear cataract, left eye: Secondary | ICD-10-CM | POA: Diagnosis not present

## 2015-01-19 DIAGNOSIS — H25042 Posterior subcapsular polar age-related cataract, left eye: Secondary | ICD-10-CM | POA: Diagnosis not present

## 2015-01-19 DIAGNOSIS — I10 Essential (primary) hypertension: Secondary | ICD-10-CM | POA: Diagnosis not present

## 2015-01-19 DIAGNOSIS — H25012 Cortical age-related cataract, left eye: Secondary | ICD-10-CM | POA: Diagnosis not present

## 2015-01-21 ENCOUNTER — Encounter: Payer: Self-pay | Admitting: Internal Medicine

## 2015-01-30 DIAGNOSIS — M76822 Posterior tibial tendinitis, left leg: Secondary | ICD-10-CM | POA: Diagnosis not present

## 2015-01-30 DIAGNOSIS — B351 Tinea unguium: Secondary | ICD-10-CM | POA: Diagnosis not present

## 2015-01-30 DIAGNOSIS — M25571 Pain in right ankle and joints of right foot: Secondary | ICD-10-CM | POA: Diagnosis not present

## 2015-01-30 DIAGNOSIS — M6702 Short Achilles tendon (acquired), left ankle: Secondary | ICD-10-CM | POA: Diagnosis not present

## 2015-02-11 DIAGNOSIS — D510 Vitamin B12 deficiency anemia due to intrinsic factor deficiency: Secondary | ICD-10-CM | POA: Diagnosis not present

## 2015-03-03 DIAGNOSIS — H2512 Age-related nuclear cataract, left eye: Secondary | ICD-10-CM | POA: Diagnosis not present

## 2015-03-03 DIAGNOSIS — H25012 Cortical age-related cataract, left eye: Secondary | ICD-10-CM | POA: Diagnosis not present

## 2015-03-17 DIAGNOSIS — I10 Essential (primary) hypertension: Secondary | ICD-10-CM | POA: Diagnosis not present

## 2015-03-17 DIAGNOSIS — E611 Iron deficiency: Secondary | ICD-10-CM | POA: Diagnosis not present

## 2015-03-17 DIAGNOSIS — R21 Rash and other nonspecific skin eruption: Secondary | ICD-10-CM | POA: Diagnosis not present

## 2015-03-17 DIAGNOSIS — Z6829 Body mass index (BMI) 29.0-29.9, adult: Secondary | ICD-10-CM | POA: Diagnosis not present

## 2015-03-17 DIAGNOSIS — E119 Type 2 diabetes mellitus without complications: Secondary | ICD-10-CM | POA: Diagnosis not present

## 2015-03-17 DIAGNOSIS — D510 Vitamin B12 deficiency anemia due to intrinsic factor deficiency: Secondary | ICD-10-CM | POA: Diagnosis not present

## 2015-03-19 DIAGNOSIS — E11349 Type 2 diabetes mellitus with severe nonproliferative diabetic retinopathy without macular edema: Secondary | ICD-10-CM | POA: Diagnosis not present

## 2015-03-19 DIAGNOSIS — E11359 Type 2 diabetes mellitus with proliferative diabetic retinopathy without macular edema: Secondary | ICD-10-CM | POA: Diagnosis not present

## 2015-03-19 DIAGNOSIS — H4011X3 Primary open-angle glaucoma, severe stage: Secondary | ICD-10-CM | POA: Diagnosis not present

## 2015-04-24 DIAGNOSIS — Z23 Encounter for immunization: Secondary | ICD-10-CM | POA: Diagnosis not present

## 2015-04-24 DIAGNOSIS — Z1389 Encounter for screening for other disorder: Secondary | ICD-10-CM | POA: Diagnosis not present

## 2015-04-24 DIAGNOSIS — E1139 Type 2 diabetes mellitus with other diabetic ophthalmic complication: Secondary | ICD-10-CM | POA: Diagnosis not present

## 2015-04-24 DIAGNOSIS — I10 Essential (primary) hypertension: Secondary | ICD-10-CM | POA: Diagnosis not present

## 2015-04-24 DIAGNOSIS — Z6832 Body mass index (BMI) 32.0-32.9, adult: Secondary | ICD-10-CM | POA: Diagnosis not present

## 2015-04-24 DIAGNOSIS — I2510 Atherosclerotic heart disease of native coronary artery without angina pectoris: Secondary | ICD-10-CM | POA: Diagnosis not present

## 2015-04-29 DIAGNOSIS — E611 Iron deficiency: Secondary | ICD-10-CM | POA: Diagnosis not present

## 2015-05-01 DIAGNOSIS — B351 Tinea unguium: Secondary | ICD-10-CM | POA: Diagnosis not present

## 2015-05-01 DIAGNOSIS — M25571 Pain in right ankle and joints of right foot: Secondary | ICD-10-CM | POA: Diagnosis not present

## 2015-06-10 DIAGNOSIS — D510 Vitamin B12 deficiency anemia due to intrinsic factor deficiency: Secondary | ICD-10-CM | POA: Diagnosis not present

## 2015-07-02 ENCOUNTER — Encounter: Payer: Self-pay | Admitting: Internal Medicine

## 2015-07-15 DIAGNOSIS — D510 Vitamin B12 deficiency anemia due to intrinsic factor deficiency: Secondary | ICD-10-CM | POA: Diagnosis not present

## 2015-07-31 DIAGNOSIS — M76822 Posterior tibial tendinitis, left leg: Secondary | ICD-10-CM | POA: Diagnosis not present

## 2015-08-06 ENCOUNTER — Ambulatory Visit (AMBULATORY_SURGERY_CENTER): Payer: Self-pay | Admitting: *Deleted

## 2015-08-06 VITALS — Ht 67.0 in | Wt 201.0 lb

## 2015-08-06 DIAGNOSIS — Z86010 Personal history of colonic polyps: Secondary | ICD-10-CM

## 2015-08-06 MED ORDER — NA SULFATE-K SULFATE-MG SULF 17.5-3.13-1.6 GM/177ML PO SOLN: 1.0000 | Freq: Once | ORAL | Status: DC

## 2015-08-06 NOTE — Progress Notes (Signed)
No egg or soy allergy known to patient  No issues with past sedation with any surgeries  or procedures, no intubation problems -hallucinations post op after heart surgery  No diet pills per patient No home 02 use per patient  No blood thinners per patient   Pt states his prep was 90 $ in July 2016 and he cannot pay that again. Sample given Medication Samples have been provided to the patient.  Drug name: suprep  Qty: 1  LOTNE:[id number]  Exp.Date: 12-18  The patient has been instructed regarding the correct time, dose, and frequency of taking this medication, including desired effects and most common side effects.   Hetty Ely Widener 3:16 PM 08/06/2015

## 2015-08-07 ENCOUNTER — Ambulatory Visit (INDEPENDENT_AMBULATORY_CARE_PROVIDER_SITE_OTHER): Payer: Medicare Other | Admitting: Cardiology

## 2015-08-07 ENCOUNTER — Encounter: Payer: Self-pay | Admitting: Cardiology

## 2015-08-07 VITALS — BP 138/70 | HR 58 | Ht 66.0 in | Wt 197.0 lb

## 2015-08-07 DIAGNOSIS — Z954 Presence of other heart-valve replacement: Secondary | ICD-10-CM | POA: Diagnosis not present

## 2015-08-07 DIAGNOSIS — I119 Hypertensive heart disease without heart failure: Secondary | ICD-10-CM | POA: Diagnosis not present

## 2015-08-07 DIAGNOSIS — Z951 Presence of aortocoronary bypass graft: Secondary | ICD-10-CM

## 2015-08-07 DIAGNOSIS — Z952 Presence of prosthetic heart valve: Secondary | ICD-10-CM

## 2015-08-07 NOTE — Patient Instructions (Signed)
Medication Instructions:  Your physician recommends that you continue on your current medications as directed. Please refer to the Current Medication list given to you today.   Labwork: none  Testing/Procedures: none  Follow-Up: Your physician wants you to follow-up in: 6 months with Dr. Skains You will receive a reminder letter in the mail two months in advance. If you don't receive a letter, please call our office to schedule the follow-up appointment.   Any Other Special Instructions Will Be Listed Below (If Applicable).     If you need a refill on your cardiac medications before your next appointment, please call your pharmacy.   

## 2015-08-07 NOTE — Progress Notes (Signed)
Cardiology Office Note   Date:  08/07/2015   ID:  Nathan [name], DOB February 24, 1946, MRN [id number]  PCP:  Donnajean Lopes, MD  Cardiologist: Darlin Coco MD  Chief Complaint  Patient presents with  . scheduled follow up    CAD. Denies any chest pain, shortness of breath, le edema, or claudication      History of Present Illness: Nathan [name] is a 70 y.o. male who presents for scheduled follow-up visit He has a history of coronary artery bypass graft surgery and aortic valve replacement for severe aortic stenosis in April 2010. He has a tissue valve. His last echocardiogram on 06/13/14 showed normal aortic valve prosthetic function and normal left ventricular systolic function with ejection fraction of 55-60%. The patient has a history of high blood pressure, diabetes, and hypercholesterolemia. Also has a history of nightmares and night terrors.  The patient continues to work in a [employer]. He is only working half day on most of the days. He intends to work for another several years until his wife retires from her work.   He is diabetic. He has not been having any hypoglycemic episodes. Generally he walks for exercise.  During the winter he gets less exercise.  His weight however has been stable over the winter. He has not been having any chest pain or shortness of breath or palpitations.  He has a past history of PVCs but has not been aware of any recently.  Past Medical History  Diagnosis Date  . Systolic hypertension   . Sinus bradycardia   . CAD (coronary artery disease)   . History of prosthetic aortic valve   . Aortic stenosis   . History of gastrointestinal bleeding   . Hypercholesteremia   . Diabetes mellitus, type 2 ([place])   . History of diabetic retinopathy   . History of necrotizing fasciitis   . Postoperative anemia   . Nightmares     CHRONIC  . Vitamin B12 deficiency   . Diverticulosis of colon (without mention of hemorrhage)   . Personal history of  colonic polyps 11/23/2009    TUBULAR ADENOMA  . 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 Stomach ulcer     Hx of  . Blood transfusion without reported diagnosis   . Heart murmur   . OSA (obstructive sleep apnea)     borderline no CPAP   . GERD (gastroesophageal reflux disease)   . Arthritis   . Duodenal ulcer perforation ([place])     blood transfusion  . Sleep apnea     no cpap    Past Surgical History  Procedure Laterality Date  . Retinal laser procedure    . Coronary artery bypass graft  2010  . Scrotal surgery  1995    resection  . Hernia repair  1972  . Aortic valve replacement    . Flesh eating disease surgery       surgeries x 7   . Umbilical hernia repair N/A 04/10/2013    Procedure: HERNIA REPAIR UMBILICAL ADULT with mesh;  Surgeon: Pedro Earls, MD;  Location: [facility];  Service: General;  Laterality: N/A;  . Cataract extraction w/ intraocular lens implant      OD  . Blepharoplasty  11-24-14     Current Outpatient Prescriptions  Medication Sig Dispense Refill  . allopurinol (ZYLOPRIM) 300 MG tablet Take 300 mg by mouth daily.    Marland Kitchen ALPRAZolam (XANAX) 0.25 MG tablet Take 0.25 mg by mouth daily as needed for anxiety.     Marland Kitchen  amLODipine (NORVASC) 5 MG tablet Take 5 mg by mouth every morning.     . Ascorbic Acid (VITAMIN C) 1000 MG tablet Take 1,000 mg by mouth daily.    Marland Kitchen aspirin 325 MG tablet Take 325 mg by mouth daily.      Marland Kitchen atorvastatin (LIPITOR) 20 MG tablet Take 20 mg by mouth daily.  3  . Cholecalciferol (VITAMIN D3) 1000 UNITS capsule Take 2,000 Units by mouth daily.     Marland Kitchen co-enzyme Q-10 30 MG capsule Take 100 mg by mouth 2 (two) times daily.    . Cyanocobalamin (VITAMIN B-12 IJ) Inject into the muscle every 30 (thirty) days. Amt/dose is unknown    . cycloSPORINE (RESTASIS) 0.05 % ophthalmic emulsion Place 1 drop into both eyes 2 (two) times daily.    Marland Kitchen doxycycline (VIBRAMYCIN) 100 MG capsule Take 100 mg by mouth 2 (two) times daily. For 7 days for dentist  appointments    . fish oil-omega-3 fatty acids 1000 MG capsule Take 1 g by mouth daily.      Marland Kitchen glipiZIDE (GLUCOTROL) 5 MG tablet Take 5 mg by mouth every morning.     . hydrochlorothiazide (MICROZIDE) 12.5 MG capsule Take 12.5 mg by mouth daily.    Marland Kitchen HYDROcodone-acetaminophen (VICODIN) 5-500 MG per tablet Take 0.5 tablets by mouth every 6 (six) hours as needed for pain.    Marland Kitchen insulin glargine (LANTUS SOLOSTAR) 100 UNIT/ML injection Inject 18 Units into the skin every morning.     . latanoprost (XALATAN) 0.005 % ophthalmic solution Place 1 drop into both eyes at bedtime.      Marland Kitchen losartan (COZAAR) 50 MG tablet Take 1 tablet (50 mg total) by mouth daily. 90 tablet 1  . metFORMIN (GLUCOPHAGE) 1000 MG tablet Take 1,000 mg by mouth 2 (two) times daily with a meal.      . metoprolol (LOPRESSOR) 50 MG tablet Take 50 mg by mouth 2 (two) times daily.     . minoxidil (LONITEN) 10 MG tablet Take 0.5 tablets by mouth daily.  5  . Multiple Vitamin (MULTIVITAMIN) tablet Take 1 tablet by mouth daily.      Marland Kitchen omeprazole (PRILOSEC) 40 MG capsule TAKE ONE CAPSULE BY MOUTH TWICE DAILY  (Patient taking differently: TAKE ONE CAPSULE BY MOUTH TWICE DAILY) 60 capsule 2  . oxyCODONE-acetaminophen (PERCOCET/ROXICET) 5-325 MG tablet Take by mouth every 4 (four) hours as needed for moderate pain or severe pain.    Marland Kitchen oxymetazoline (AFRIN) 0.05 % nasal spray Place 2 sprays into the nose 2 (two) times daily as needed for congestion.    . rosuvastatin (CRESTOR) 5 MG tablet Take 5 mg by mouth every morning.      No current facility-administered medications for this visit.    Allergies:   Shellfish-derived products and Zolpidem tartrate    Social History:  The patient  reports that he has never smoked. He has never used smokeless tobacco. He reports that he does not drink alcohol or use illicit drugs.   Family History:  The patient's family history includes Alcohol abuse in his father; Cirrhosis in his father; Colon cancer (age  of onset: 61) in his mother; Diabetes in his mother and sister; Hypertension in his mother and sister; Rectal cancer in his mother. There is no history of Heart attack, Stroke, Colon polyps, Esophageal cancer, or Stomach cancer.    ROS:  Please see the history of present illness.   Otherwise, review of systems are positive for none.   All  other systems are reviewed and negative.    PHYSICAL EXAM: VS:  BP 138/70 mmHg  Pulse 58  Ht 5\' 6"  (1.676 m)  Wt 197 lb (89.359 kg)  BMI 31.81 kg/m2 , BMI Body mass index is 31.81 kg/(m^2). GEN: Well nourished, well developed, in no acute distress HEENT: normal Neck: no JVD, carotid bruits, or masses Cardiac: RRR there is a soft systolic murmur across the prosthetic aortic valve.  No diastolic murmur.  No gallop or rub.  No peripheral edema. Respiratory:  clear to auscultation bilaterally, normal work of breathing GI: soft, nontender, nondistended, + BS MS: no deformity or atrophy Skin: warm and dry, no rash Neuro:  Strength and sensation are intact Psych: euthymic mood, full affect   EKG:  EKG is not ordered today.    Recent Labs: No results found for requested labs within last 365 days.    Lipid Panel No results found for: CHOL, TRIG, HDL, CHOLHDL, VLDL, LDLCALC, LDLDIRECT    Wt Readings from Last 3 Encounters:  08/07/15 197 lb (89.359 kg)  08/06/15 201 lb (91.173 kg)  01/15/15 198 lb (89.812 kg)        ASSESSMENT AND PLAN:  1. ischemic heart disease status post CABG April 2010 2. valvular heart disease status post bioprosthetic aortic valve replacement for severe aortic stenosis in April 2010.  3. diabetes mellitus 4. Hypercholesterolemia 5. hypertensive heart disease without heart failure. Good response to switch to losartan 6. PVCs, asymptomatic, quiescent at present Plan: Continue same medication.Continue heart healthy prudent diet. Check in 6 months for follow-up office visit with Dr. Marlou Porch. His lipids are  followed by his PCP Dr. Philip Aspen   Current medicines are reviewed at length with the patient today.  The patient does not have concerns regarding medicines.  The following changes have been made:  no change  Labs/ tests ordered today include:  No orders of the defined types were placed in this encounter.       Berna Spare MD 08/07/2015 1:19 PM    [facility] [place], [place], [place]  [address] Phone: [phone]; Fax: [phone]

## 2015-08-20 ENCOUNTER — Encounter: Payer: Self-pay | Admitting: Internal Medicine

## 2015-08-20 ENCOUNTER — Ambulatory Visit (AMBULATORY_SURGERY_CENTER): Payer: Medicare Other | Admitting: Internal Medicine

## 2015-08-20 VITALS — BP 145/64 | HR 56 | Temp 96.8°F | Resp 21 | Ht 67.0 in | Wt 201.0 lb

## 2015-08-20 DIAGNOSIS — D123 Benign neoplasm of transverse colon: Secondary | ICD-10-CM

## 2015-08-20 DIAGNOSIS — D125 Benign neoplasm of sigmoid colon: Secondary | ICD-10-CM | POA: Diagnosis not present

## 2015-08-20 DIAGNOSIS — Z86010 Personal history of colonic polyps: Secondary | ICD-10-CM

## 2015-08-20 DIAGNOSIS — D122 Benign neoplasm of ascending colon: Secondary | ICD-10-CM | POA: Diagnosis not present

## 2015-08-20 DIAGNOSIS — G4733 Obstructive sleep apnea (adult) (pediatric): Secondary | ICD-10-CM | POA: Diagnosis not present

## 2015-08-20 DIAGNOSIS — I2510 Atherosclerotic heart disease of native coronary artery without angina pectoris: Secondary | ICD-10-CM | POA: Diagnosis not present

## 2015-08-20 LAB — GLUCOSE, CAPILLARY
GLUCOSE-CAPILLARY: 133 mg/dL — AB (ref 65–99)
Glucose-Capillary: 172 mg/dL — ABNORMAL HIGH (ref 65–99)

## 2015-08-20 MED ORDER — SODIUM CHLORIDE 0.9 % IV SOLN: 500.0000 mL | INTRAVENOUS | Status: DC

## 2015-08-20 NOTE — Progress Notes (Signed)
Called to room to assist during endoscopic procedure.  Patient ID and intended procedure confirmed with present staff. Received instructions for my participation in the procedure from the performing physician.  

## 2015-08-20 NOTE — Op Note (Signed)
[facility]  [address] [place], [address]   COLONOSCOPY PROCEDURE REPORT  PATIENT: Nathan [name], Nathan [name]  MR#: [id number] BIRTHDATE: [birth year] , 68  yrs. old GENDER: male ENDOSCOPIST: Jerene Bears, MD PROCEDURE DATE:  08/20/2015 PROCEDURE:   Colonoscopy, surveillance and Colonoscopy with snare polypectomy First Screening Colonoscopy - Avg.  risk and is 50 yrs.  old or older - No.  Prior Negative Screening - Now for repeat screening. N/A  History of Adenoma - Now for follow-up colonoscopy & has been > or = to 3 yrs.  No.  It has been less than 3 yrs since last colonoscopy.  Medical reason.  Polyps removed today? Yes ASA CLASS:   Class III INDICATIONS:Surveillance due to prior colonic neoplasia and PH Colon Adenoma, piecemeal EMR of large proximal transverse colon polyp in July 2016. MEDICATIONS: Monitored anesthesia care and Propofol 200 mg IV  DESCRIPTION OF PROCEDURE:   After the risks benefits and alternatives of the procedure were thoroughly explained, informed consent was obtained.  The digital rectal exam revealed no rectal mass.   The LB [id number]  endoscope was introduced through the anus and advanced to the cecum, which was identified by both the appendix and ileocecal valve. No adverse events experienced. The quality of the prep was good.  (Suprep was used)  The instrument was then slowly withdrawn as the colon was fully examined. Estimated blood loss is zero unless otherwise noted in this procedure report.   COLON FINDINGS: Three sessile polyps ranging between 3-62mm in size were found in the ascending colon, at the hepatic flexure, and in the transverse colon.  Polypectomies were performed with a cold snare.  The resection was complete, the polyp tissue was completely retrieved and sent to histology.  2 mucosal tattoos seen in the proximal transverse colon at location of prior piecemeal EMR. A probable sessile polyp, representing residual  adenomatous tissue, measuring 8 mm in size was found in the proximal transverse colon between the tattoos.  A polypectomy was performed using snare cautery.  The resection was complete, the polyp tissue was completely retrieved and sent to histology.  No other residual adenomatous tissue seen between the mucosal tattoos.  A sessile polyp measuring 4 mm in size was found in the sigmoid colon.  A polypectomy was performed with a cold snare.  The resection was complete, the polyp tissue was completely retrieved and sent to histology.   There was moderate diverticulosis noted throughout the entire examined colon.  Retroflexed views revealed internal hemorrhoids and hypertrophied anal papillae. The time to cecum = 2.1 Withdrawal time = 17.2   The scope was withdrawn and the procedure completed.  COMPLICATIONS: There were no immediate complications.  ENDOSCOPIC IMPRESSION: 1.   Three sessile polyps ranging between 3-39mm in size were found in the ascending colon, at the hepatic flexure, and in the transverse colon; polypectomies were performed with a cold snare 2.   Sessile polyp (likely residual from EMR in July 2016) was found in the proximal transverse colon between previously placed tattoos; polypectomy was performed using snare cautery 3.   Sessile polyp was found in the sigmoid colon; polypectomy was performed with a cold snare 4.   Moderate diverticulosis was noted throughout the entire examined colon  RECOMMENDATIONS: 1.  Avoid all NSAIDs for the next 2 weeks. 2.  Await pathology results 3.  High fiber diet 4.  Timing of repeat colonoscopy will be determined by pathology findings, likely 1 year given history 5.  You will receive a letter within 1-2 weeks with the results of your biopsy as well as final recommendations.  Please call my office if you have not received a letter after 3 weeks.  eSigned:  Jerene Bears, MD 08/20/2015 8:45 AM   cc: the patient, Dr.  Philip Aspen   PATIENT NAME:  Nathan [name], Nathan [name] MR#: [id number]

## 2015-08-20 NOTE — Progress Notes (Signed)
A/ox3 pleased with MAC, report to Celia RN 

## 2015-08-20 NOTE — Patient Instructions (Signed)
Discharge instructions given. Handouts on polyps,diverticulosis and hemorrhoids. Resume previous medications. Hold aspirin and all anti inflammatory medications for 2 weeks. YOU HAD AN ENDOSCOPIC PROCEDURE TODAY AT [facility]:   Refer to the procedure report that was given to you for any specific questions about what was found during the examination.  If the procedure report does not answer your questions, please call your gastroenterologist to clarify.  If you requested that your care partner not be given the details of your procedure findings, then the procedure report has been included in a sealed envelope for you to review at your convenience later.  YOU SHOULD EXPECT: Some feelings of bloating in the abdomen. Passage of more gas than usual.  Walking can help get rid of the air that was put into your GI tract during the procedure and reduce the bloating. If you had a lower endoscopy (such as a colonoscopy or flexible sigmoidoscopy) you may notice spotting of blood in your stool or on the toilet paper. If you underwent a bowel prep for your procedure, you may not have a normal bowel movement for a few days.  Please Note:  You might notice some irritation and congestion in your nose or some drainage.  This is from the oxygen used during your procedure.  There is no need for concern and it should clear up in a day or so.  SYMPTOMS TO REPORT IMMEDIATELY:   Following lower endoscopy (colonoscopy or flexible sigmoidoscopy):  Excessive amounts of blood in the stool  Significant tenderness or worsening of abdominal pains  Swelling of the abdomen that is new, acute  Fever of 100F or higher   For urgent or emergent issues, a gastroenterologist can be reached at any hour by calling [phone].   DIET: Your first meal following the procedure should be a small meal and then it is ok to progress to your normal diet. Heavy or fried foods are harder to digest and may make you feel  nauseous or bloated.  Likewise, meals heavy in dairy and vegetables can increase bloating.  Drink plenty of fluids but you should avoid alcoholic beverages for 24 hours.  ACTIVITY:  You should plan to take it easy for the rest of today and you should NOT DRIVE or use heavy machinery until tomorrow (because of the sedation medicines used during the test).    FOLLOW UP: Our staff will call the number listed on your records the next business day following your procedure to check on you and address any questions or concerns that you may have regarding the information given to you following your procedure. If we do not reach you, we will leave a message.  However, if you are feeling well and you are not experiencing any problems, there is no need to return our call.  We will assume that you have returned to your regular daily activities without incident.  If any biopsies were taken you will be contacted by phone or by letter within the next 1-3 weeks.  Please call us at [phone] if you have not heard about the biopsies in 3 weeks.    SIGNATURES/CONFIDENTIALITY: You and/or your care partner have signed paperwork which will be entered into your electronic medical record.  These signatures attest to the fact that that the information above on your After Visit Summary has been reviewed and is understood.  Full responsibility of the confidentiality of this discharge information lies with you and/or your care-partner.

## 2015-08-21 ENCOUNTER — Telehealth: Payer: Self-pay

## 2015-08-21 NOTE — Telephone Encounter (Signed)
  Follow up Call-  Call back number 08/20/2015 01/15/2015  Post procedure Call Back phone  # [phone]  Permission to leave phone message Yes Yes     Patient questions:  Do you have a fever, pain , or abdominal swelling? No. Pain Score  0 *  Have you tolerated food without any problems? Yes.    Have you been able to return to your normal activities? Yes.    Do you have any questions about your discharge instructions: Diet   No. Medications  No. Follow up visit  No.  Do you have questions or concerns about your Care? No.  Actions: * If pain score is 4 or above: No action needed, pain <4.

## 2015-08-25 ENCOUNTER — Encounter: Payer: Self-pay | Admitting: Internal Medicine

## 2015-09-09 DIAGNOSIS — D510 Vitamin B12 deficiency anemia due to intrinsic factor deficiency: Secondary | ICD-10-CM | POA: Diagnosis not present

## 2015-09-23 DIAGNOSIS — I10 Essential (primary) hypertension: Secondary | ICD-10-CM | POA: Diagnosis not present

## 2015-09-23 DIAGNOSIS — Z125 Encounter for screening for malignant neoplasm of prostate: Secondary | ICD-10-CM | POA: Diagnosis not present

## 2015-09-23 DIAGNOSIS — M109 Gout, unspecified: Secondary | ICD-10-CM | POA: Diagnosis not present

## 2015-09-23 DIAGNOSIS — E784 Other hyperlipidemia: Secondary | ICD-10-CM | POA: Diagnosis not present

## 2015-09-23 DIAGNOSIS — E1139 Type 2 diabetes mellitus with other diabetic ophthalmic complication: Secondary | ICD-10-CM | POA: Diagnosis not present

## 2015-09-23 DIAGNOSIS — D510 Vitamin B12 deficiency anemia due to intrinsic factor deficiency: Secondary | ICD-10-CM | POA: Diagnosis not present

## 2015-10-01 DIAGNOSIS — Z0000 Encounter for general adult medical examination without abnormal findings: Secondary | ICD-10-CM | POA: Diagnosis not present

## 2015-10-01 DIAGNOSIS — R252 Cramp and spasm: Secondary | ICD-10-CM | POA: Diagnosis not present

## 2015-10-01 DIAGNOSIS — Z1389 Encounter for screening for other disorder: Secondary | ICD-10-CM | POA: Diagnosis not present

## 2015-10-01 DIAGNOSIS — Z6831 Body mass index (BMI) 31.0-31.9, adult: Secondary | ICD-10-CM | POA: Diagnosis not present

## 2015-10-01 DIAGNOSIS — E1139 Type 2 diabetes mellitus with other diabetic ophthalmic complication: Secondary | ICD-10-CM | POA: Diagnosis not present

## 2015-10-01 DIAGNOSIS — H4089 Other specified glaucoma: Secondary | ICD-10-CM | POA: Diagnosis not present

## 2015-10-01 DIAGNOSIS — I2510 Atherosclerotic heart disease of native coronary artery without angina pectoris: Secondary | ICD-10-CM | POA: Diagnosis not present

## 2015-10-01 DIAGNOSIS — G4733 Obstructive sleep apnea (adult) (pediatric): Secondary | ICD-10-CM | POA: Diagnosis not present

## 2015-10-01 DIAGNOSIS — I10 Essential (primary) hypertension: Secondary | ICD-10-CM | POA: Diagnosis not present

## 2015-10-01 DIAGNOSIS — E784 Other hyperlipidemia: Secondary | ICD-10-CM | POA: Diagnosis not present

## 2015-10-05 DIAGNOSIS — Z1212 Encounter for screening for malignant neoplasm of rectum: Secondary | ICD-10-CM | POA: Diagnosis not present

## 2015-10-13 DIAGNOSIS — H26491 Other secondary cataract, right eye: Secondary | ICD-10-CM | POA: Diagnosis not present

## 2015-10-13 DIAGNOSIS — H401132 Primary open-angle glaucoma, bilateral, moderate stage: Secondary | ICD-10-CM | POA: Diagnosis not present

## 2015-10-13 DIAGNOSIS — H3563 Retinal hemorrhage, bilateral: Secondary | ICD-10-CM | POA: Diagnosis not present

## 2015-10-13 DIAGNOSIS — E113493 Type 2 diabetes mellitus with severe nonproliferative diabetic retinopathy without macular edema, bilateral: Secondary | ICD-10-CM | POA: Diagnosis not present

## 2015-10-28 DIAGNOSIS — D510 Vitamin B12 deficiency anemia due to intrinsic factor deficiency: Secondary | ICD-10-CM | POA: Diagnosis not present

## 2015-11-03 DIAGNOSIS — M6702 Short Achilles tendon (acquired), left ankle: Secondary | ICD-10-CM | POA: Diagnosis not present

## 2015-11-03 DIAGNOSIS — M76822 Posterior tibial tendinitis, left leg: Secondary | ICD-10-CM | POA: Diagnosis not present

## 2015-11-03 DIAGNOSIS — M25571 Pain in right ankle and joints of right foot: Secondary | ICD-10-CM | POA: Diagnosis not present

## 2015-11-03 DIAGNOSIS — B351 Tinea unguium: Secondary | ICD-10-CM | POA: Diagnosis not present

## 2015-11-26 DIAGNOSIS — D239 Other benign neoplasm of skin, unspecified: Secondary | ICD-10-CM | POA: Diagnosis not present

## 2015-11-26 DIAGNOSIS — L821 Other seborrheic keratosis: Secondary | ICD-10-CM | POA: Diagnosis not present

## 2015-11-26 DIAGNOSIS — L570 Actinic keratosis: Secondary | ICD-10-CM | POA: Diagnosis not present

## 2015-12-15 DIAGNOSIS — D510 Vitamin B12 deficiency anemia due to intrinsic factor deficiency: Secondary | ICD-10-CM | POA: Diagnosis not present

## 2015-12-22 DIAGNOSIS — S90424A Blister (nonthermal), right lesser toe(s), initial encounter: Secondary | ICD-10-CM | POA: Diagnosis not present

## 2015-12-22 DIAGNOSIS — E119 Type 2 diabetes mellitus without complications: Secondary | ICD-10-CM | POA: Diagnosis not present

## 2015-12-29 ENCOUNTER — Encounter: Payer: Self-pay | Admitting: Podiatry

## 2015-12-29 ENCOUNTER — Ambulatory Visit (INDEPENDENT_AMBULATORY_CARE_PROVIDER_SITE_OTHER): Payer: Medicare Other

## 2015-12-29 ENCOUNTER — Ambulatory Visit (INDEPENDENT_AMBULATORY_CARE_PROVIDER_SITE_OTHER): Payer: Medicare Other | Admitting: Podiatry

## 2015-12-29 VITALS — BP 126/52 | HR 74 | Resp 16

## 2015-12-29 DIAGNOSIS — E119 Type 2 diabetes mellitus without complications: Secondary | ICD-10-CM

## 2015-12-29 DIAGNOSIS — Z0189 Encounter for other specified special examinations: Secondary | ICD-10-CM

## 2015-12-29 DIAGNOSIS — E11621 Type 2 diabetes mellitus with foot ulcer: Secondary | ICD-10-CM | POA: Diagnosis not present

## 2015-12-29 DIAGNOSIS — E1142 Type 2 diabetes mellitus with diabetic polyneuropathy: Secondary | ICD-10-CM | POA: Diagnosis not present

## 2015-12-29 DIAGNOSIS — L97519 Non-pressure chronic ulcer of other part of right foot with unspecified severity: Secondary | ICD-10-CM

## 2015-12-29 NOTE — Progress Notes (Signed)
   Subjective:    Patient ID: Nathan [name], male    DOB: February 27, 1946, 70 y.o.   MRN: [id number]  HPI: He presents today with a chief concern of a fissure ulceration lateral aspect of the hallux right. He states his been there for several months and it is sort of comes and goes. He wears a bandage over top of it or an aperture pad to help decrease the friction between the hallux and second toe.    Review of Systems  Eyes: Positive for visual disturbance.  Cardiovascular: Positive for leg swelling.  Musculoskeletal: Positive for myalgias and arthralgias.  Skin: Positive for wound.  All other systems reviewed and are negative.      Objective:   Physical Exam: Vital signs are stable he is alert and oriented 3 pulses are palpable. Neurologic sensorium is slightly diminished per Semmes-Weinstein monofilament. Deep tendon reflexes are intact. Muscle strength is 5 over 5 dorsiflexion plantar flexors and inverters and everters all his muscular sutures intact. Cutaneous evaluation demonstrates skin breakdown to the lateral aspect of the hallux right with minimal erythema and no saline as drainage or odor. This does not probe to bone. Superficial ulceration appears to be from either bug bite or juxtaposition of the second toe.        Assessment & Plan:  Diabetes mellitus with diabetic peripheral neuropathy. Diabetic angiopathy with ulceration at this point.  Plan: I encouraged him to start soaking this and letting this dry out and I encouraged him to continue to use the aperture pad. He was soaking in Epsom salts and warm water at least once every day to every other day. Should this become red and painful or purulent he is to notify us immediately.

## 2015-12-31 DIAGNOSIS — I10 Essential (primary) hypertension: Secondary | ICD-10-CM | POA: Diagnosis not present

## 2015-12-31 DIAGNOSIS — E11621 Type 2 diabetes mellitus with foot ulcer: Secondary | ICD-10-CM | POA: Diagnosis not present

## 2015-12-31 DIAGNOSIS — Z6830 Body mass index (BMI) 30.0-30.9, adult: Secondary | ICD-10-CM | POA: Diagnosis not present

## 2015-12-31 DIAGNOSIS — E1139 Type 2 diabetes mellitus with other diabetic ophthalmic complication: Secondary | ICD-10-CM | POA: Diagnosis not present

## 2015-12-31 DIAGNOSIS — E784 Other hyperlipidemia: Secondary | ICD-10-CM | POA: Diagnosis not present

## 2015-12-31 DIAGNOSIS — G4733 Obstructive sleep apnea (adult) (pediatric): Secondary | ICD-10-CM | POA: Diagnosis not present

## 2015-12-31 DIAGNOSIS — I2510 Atherosclerotic heart disease of native coronary artery without angina pectoris: Secondary | ICD-10-CM | POA: Diagnosis not present

## 2016-01-07 ENCOUNTER — Encounter: Payer: Self-pay | Admitting: Podiatry

## 2016-01-07 ENCOUNTER — Ambulatory Visit (INDEPENDENT_AMBULATORY_CARE_PROVIDER_SITE_OTHER): Payer: Medicare Other | Admitting: Podiatry

## 2016-01-07 DIAGNOSIS — L97519 Non-pressure chronic ulcer of other part of right foot with unspecified severity: Secondary | ICD-10-CM

## 2016-01-07 DIAGNOSIS — E11621 Type 2 diabetes mellitus with foot ulcer: Secondary | ICD-10-CM | POA: Diagnosis not present

## 2016-01-07 NOTE — Progress Notes (Signed)
He presents today for follow-up of a superficial ulceration to the dorsal lateral aspect of the right hallux. He states is doing much better is much improved.  Objective: Vital signs are stable he is alert and oriented 3. No erythema edema cellulitis drainage or odor. Ulceration appears to be healing nicely.  Assessment: Healing ulceration dorsolateral aspect of the proximal phalanx right foot.  Plan: Continue to pad the area until completely resolved follow up with me with any other questions or concerns.

## 2016-01-20 DIAGNOSIS — D510 Vitamin B12 deficiency anemia due to intrinsic factor deficiency: Secondary | ICD-10-CM | POA: Diagnosis not present

## 2016-02-09 ENCOUNTER — Encounter: Payer: Self-pay | Admitting: Cardiology

## 2016-02-09 ENCOUNTER — Ambulatory Visit (INDEPENDENT_AMBULATORY_CARE_PROVIDER_SITE_OTHER): Payer: Medicare Other | Admitting: Cardiology

## 2016-02-09 VITALS — BP 124/68 | HR 63 | Ht 67.0 in | Wt 191.6 lb

## 2016-02-09 DIAGNOSIS — Z951 Presence of aortocoronary bypass graft: Secondary | ICD-10-CM

## 2016-02-09 DIAGNOSIS — Z954 Presence of other heart-valve replacement: Secondary | ICD-10-CM | POA: Diagnosis not present

## 2016-02-09 DIAGNOSIS — R079 Chest pain, unspecified: Secondary | ICD-10-CM

## 2016-02-09 DIAGNOSIS — Z952 Presence of prosthetic heart valve: Secondary | ICD-10-CM

## 2016-02-09 DIAGNOSIS — R0602 Shortness of breath: Secondary | ICD-10-CM | POA: Diagnosis not present

## 2016-02-09 DIAGNOSIS — I119 Hypertensive heart disease without heart failure: Secondary | ICD-10-CM

## 2016-02-09 MED ORDER — ASPIRIN 81 MG PO TABS: 81.0000 mg | ORAL_TABLET | Freq: Every day | ORAL | Status: DC

## 2016-02-09 NOTE — Progress Notes (Signed)
Cardiology Office Note    Date:  02/09/2016   ID:  Nathan [name], DOB 1945/07/14, MRN [id number]  PCP:  Donnajean Lopes, MD  Cardiologist:   Candee Furbish, MD   No chief complaint on file.   History of Present Illness:  Nathan [name] is a 70 y.o. male former patient of Dr. Sherryl Barters with history of coronary artery bypass graft surgery and aortic valve replacement for severe aortic stenosis in April 2010. He has a tissue valve. His last echocardiogram on 06/13/14 showed normal aortic valve prosthetic function and normal left ventricular systolic function with ejection fraction of 55-60%. The patient has a history of high blood pressure, diabetes, and hypercholesterolemia. Also has a history of nightmares and night terrors.   The patient continues to work in a [employer]. 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 He is only working half day on most of the days. He intends to work for another several years until his wife retires from her work.   He has been expressing some chest pressure with walking as well as shortness of breath.  He is diabetic. He has not been having any hypoglycemic episodes. Generally he walks for exercise.  During the winter he gets less exercise.  His weight however has been stable over the winter.  He has not been having any chest pain or shortness of breath or palpitations.  He has a past history of PVCs but has not been aware of any recently.  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 He remembers the [facility] coming to the hospital. Dr. Risa Grill helped save his life he states.  Past Medical History:  Diagnosis Date  . Aortic stenosis   . Arthritis   . Blood transfusion without reported diagnosis   . CAD (coronary artery disease)   . Diabetes mellitus, type 2 ([place])   . Diverticulosis of colon (without mention of hemorrhage)   . Duodenal ulcer perforation ([place])    blood transfusion  . 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 GERD (gastroesophageal reflux disease)   . Heart murmur   . History of diabetic retinopathy   . History of gastrointestinal bleeding   . History of necrotizing fasciitis   . History of prosthetic aortic valve   . Hypercholesteremia   . Nightmares    CHRONIC  . OSA (obstructive sleep apnea)    borderline no CPAP   . Personal history of colonic polyps 11/23/2009   TUBULAR ADENOMA  . Postoperative anemia   . Sinus bradycardia   . Sleep apnea    no cpap  . Stomach ulcer    Hx of  . Systolic hypertension   . Vitamin B12 deficiency     Past Surgical History:  Procedure Laterality Date  . AORTIC VALVE REPLACEMENT    . BLEPHAROPLASTY  11-24-14  . CATARACT EXTRACTION W/ INTRAOCULAR LENS IMPLANT     OD  . CORONARY ARTERY BYPASS GRAFT  2010  . flesh eating disease surgery      surgeries x 7   . [place]  . RETINAL LASER PROCEDURE    . [place]   resection  . UMBILICAL HERNIA REPAIR N/A 04/10/2013   Procedure: HERNIA REPAIR UMBILICAL ADULT with mesh;  Surgeon: Pedro Earls, MD;  Location: [facility];  Service: General;  Laterality: N/A;    Current Medications: Outpatient Medications Prior to Visit  Medication Sig Dispense Refill  . allopurinol (ZYLOPRIM) 300 MG tablet Take 300 mg  by mouth daily.    Marland Kitchen ALPRAZolam (XANAX) 0.25 MG tablet Take 0.25 mg by mouth daily as needed for anxiety.     Marland Kitchen amLODipine (NORVASC) 5 MG tablet Take 5 mg by mouth every morning.     . Ascorbic Acid (VITAMIN C) 1000 MG tablet Take 1,000 mg by mouth daily.    Marland Kitchen atorvastatin (LIPITOR) 20 MG tablet Take 20 mg by mouth daily.  3  . BAYER CONTOUR TEST test strip USE 1 STRIP THREE TIMES DAILY TO CHECK BLOOD SUGARS.  11  . Cholecalciferol (VITAMIN D3) 1000 UNITS capsule Take 2,000 Units by mouth daily.     Marland Kitchen co-enzyme Q-10 30 MG capsule Take 100 mg by mouth 2 (two) times daily.    . Cyanocobalamin (VITAMIN B-12 IJ) Inject into the muscle every 30 (thirty) days.  Amt/dose is unknown    . cyclobenzaprine (FLEXERIL) 10 MG tablet TAKE 1 TABLET BY MOUTH DAILY AS NEEDED FOR MUSCLE CRAMPS  3  . cycloSPORINE (RESTASIS) 0.05 % ophthalmic emulsion Place 1 drop into both eyes 2 (two) times daily.    Marland Kitchen doxycycline (VIBRAMYCIN) 100 MG capsule Take 100 mg by mouth 2 (two) times daily. For 7 days for dentist appointments    . fish oil-omega-3 fatty acids 1000 MG capsule Take 1 g by mouth daily.      Marland Kitchen glipiZIDE (GLUCOTROL) 5 MG tablet Take 5 mg by mouth every morning.     . hydrochlorothiazide (MICROZIDE) 12.5 MG capsule Take 12.5 mg by mouth daily. Reported on 08/20/2015    . HYDROcodone-acetaminophen (VICODIN) 5-500 MG per tablet Take 0.5 tablets by mouth every 6 (six) hours as needed for pain.    Marland Kitchen insulin glargine (LANTUS SOLOSTAR) 100 UNIT/ML injection Inject 18 Units into the skin every morning.     . latanoprost (XALATAN) 0.005 % ophthalmic solution Place 1 drop into both eyes at bedtime.      Marland Kitchen losartan (COZAAR) 50 MG tablet Take 1 tablet (50 mg total) by mouth daily. 90 tablet 1  . metFORMIN (GLUCOPHAGE) 1000 MG tablet Take 1,000 mg by mouth 2 (two) times daily with a meal.      . metoprolol (LOPRESSOR) 50 MG tablet Take 50 mg by mouth 2 (two) times daily.     . minoxidil (LONITEN) 10 MG tablet Take 0.5 tablets by mouth daily.  5  . Multiple Vitamin (MULTIVITAMIN) tablet Take 1 tablet by mouth daily.      Marland Kitchen oxyCODONE-acetaminophen (PERCOCET/ROXICET) 5-325 MG tablet Take by mouth every 4 (four) hours as needed for moderate pain or severe pain.    Marland Kitchen oxymetazoline (AFRIN) 0.05 % nasal spray Place 2 sprays into the nose 2 (two) times daily as needed for congestion. Reported on 08/20/2015    . aspirin 325 MG tablet Take 325 mg by mouth daily.      Marland Kitchen omeprazole (PRILOSEC) 40 MG capsule TAKE ONE CAPSULE BY MOUTH TWICE DAILY  (Patient taking differently: TAKE ONE CAPSULE BY MOUTH TWICE DAILY) 60 capsule 2  . rosuvastatin (CRESTOR) 5 MG tablet Take 5 mg by mouth every  morning.      No facility-administered medications prior to visit.      Allergies:   Shellfish-derived products and Zolpidem tartrate   Social History   Social History  . Marital status: Married    Spouse name: N/A  . Number of children: 0  . Years of education: N/A   Occupational History  . SALES Pharmacist, community   Social History  Main Topics  . Smoking status: Never Smoker  . Smokeless tobacco: Never Used  . Alcohol use No  . Drug use: No  . Sexual activity: Not Asked   Other Topics Concern  . None   Social History Narrative  . None     Family History:  The patient's family history includes Alcohol abuse in his father; Cirrhosis in his father; Colon cancer (age of onset: 83) in his mother; Diabetes in his mother and sister; Hypertension in his mother and sister; Rectal cancer in his mother.   ROS:   Please see the history of present illness.    ROS All other systems reviewed and are negative.   PHYSICAL EXAM:   VS:  BP 124/68   Pulse 63   Ht 5\' 7"  (1.702 m)   Wt 191 lb 9.6 oz (86.9 kg)   BMI 30.01 kg/m    GEN: Well nourished, well developed, in no acute distress  HEENT: normal  Neck: no JVD, carotid bruits, or masses Cardiac: RRR; no murmurs, rubs, or gallops,no edema  Respiratory:  clear to auscultation bilaterally, normal work of breathing GI: soft, nontender, nondistended, + BS MS: no deformity or atrophy  Skin: warm and dry, no rash Neuro:  Alert and Oriented x 3, Strength and sensation are intact Psych: euthymic mood, full affect  Wt Readings from Last 3 Encounters:  02/09/16 191 lb 9.6 oz (86.9 kg)  08/20/15 201 lb (91.2 kg)  08/07/15 197 lb (89.4 kg)      Studies/Labs Reviewed:   EKG:  EKG is ordered today.  The ekg ordered today demonstrates 02/09/16-sinus rhythm, 63, no other abnormalities.  ECHO 06/13/14:  - Left ventricle: The cavity size was normal. Systolic function was normal. The estimated ejection fraction was in the range of  55% to 60%. Wall motion was normal; there were no regional wall motion abnormalities. - Aortic valve: A bioprosthesis was present and functioning normally. - Right ventricle: The cavity size was mildly dilated. Wall thickness was normal. - Atrial septum: There was increased thickness of the septum, consistent with lipomatous hypertrophy.  Nuclear stress test 2011  - Normal  Recent Labs: No results found for requested labs within last 8760 hours.   Lipid Panel No results found for: CHOL, TRIG, HDL, CHOLHDL, VLDL, LDLCALC, LDLDIRECT  Additional studies/ records that were reviewed today include:  Prior office notes reviewed    ASSESSMENT:    1. Chest pain, unspecified chest pain type   2. Shortness of breath   3. Hx of CABG   4. S/P AVR (aortic valve replacement)   5. Benign hypertensive heart disease without heart failure      PLAN:  In order of problems listed above:  Chest pain/short of breath  - He is noticing these anginal type symptoms when walking, relieved with rest, mild to moderate severity. He does not recall any particular symptoms prior to his bypass surgery/aortic valve replacement. Because of the symptoms, we will check a pharmacologic nuclear stress test. He does not feel he will be able to walk the treadmill.  Coronary artery disease status post bypass surgery 2011  - Checking stress test  - decrease aspirin 81 mg  - Continue with metoprolol, atorvastatin 20, losartan  Hyperlipidemia  - Continuing with statin therapy  Obesity  -Encourage weight loss  Diabetes  - Medications reviewed, Dr. Sharlett Iles has been monitoring  Medication Adjustments/Labs and Tests Ordered: Current medicines are reviewed at length with the patient today.  Concerns regarding medicines  are outlined above.  Medication changes, Labs and Tests ordered today are listed in the Patient Instructions below. Patient Instructions  Medication Instructions:  Please decrease  ASA to 81 mg a day. Continue all other medications as listed.  Testing/Procedures: Your physician has requested that you have a lexiscan myoview. For further information please visit [email or web address]. Please follow instruction sheet, as given.  Follow-Up: Follow up in 6 months with Dr. Marlou Porch.  You will receive a letter in the mail 2 months before you are due.  Please call us when you receive this letter to schedule your follow up appointment.  If you need a refill on your cardiac medications before your next appointment, please call your pharmacy.  Thank you for choosing [facility]!!         Signed, Candee Furbish, MD  02/09/2016 12:12 PM    [address], [place], [place]  [address] Phone: [phone]; Fax: [phone]

## 2016-02-09 NOTE — Patient Instructions (Signed)
Medication Instructions:  Please decrease ASA to 81 mg a day. Continue all other medications as listed.  Testing/Procedures: Your physician has requested that you have a lexiscan myoview. For further information please visit [email or web address]. Please follow instruction sheet, as given.  Follow-Up: Follow up in 6 months with Dr. Marlou Porch.  You will receive a letter in the mail 2 months before you are due.  Please call us when you receive this letter to schedule your follow up appointment.  If you need a refill on your cardiac medications before your next appointment, please call your pharmacy.  Thank you for choosing [facility]!!

## 2016-02-18 ENCOUNTER — Telehealth (HOSPITAL_COMMUNITY): Payer: Self-pay | Admitting: *Deleted

## 2016-02-18 NOTE — Telephone Encounter (Signed)
Attempted to call patient regarding upcoming appointment- no answer x 2.  J , RN 

## 2016-02-23 ENCOUNTER — Ambulatory Visit (HOSPITAL_COMMUNITY): Payer: Medicare Other | Attending: Cardiovascular Disease

## 2016-02-23 DIAGNOSIS — I10 Essential (primary) hypertension: Secondary | ICD-10-CM | POA: Insufficient documentation

## 2016-02-23 DIAGNOSIS — R9439 Abnormal result of other cardiovascular function study: Secondary | ICD-10-CM | POA: Diagnosis not present

## 2016-02-23 DIAGNOSIS — R0602 Shortness of breath: Secondary | ICD-10-CM | POA: Diagnosis not present

## 2016-02-23 DIAGNOSIS — R079 Chest pain, unspecified: Secondary | ICD-10-CM

## 2016-02-23 DIAGNOSIS — I219 Acute myocardial infarction, unspecified: Secondary | ICD-10-CM

## 2016-02-23 DIAGNOSIS — E109 Type 1 diabetes mellitus without complications: Secondary | ICD-10-CM | POA: Insufficient documentation

## 2016-02-23 HISTORY — DX: Acute myocardial infarction, unspecified: I21.9

## 2016-02-23 LAB — MYOCARDIAL PERFUSION IMAGING
CHL CUP NUCLEAR SDS: 5
CHL CUP NUCLEAR SRS: 23
CHL CUP NUCLEAR SSS: 28
LHR: 0.39
LV sys vol: 55 mL
LVDIAVOL: 132 mL (ref 62–150)
NUC STRESS TID: 1.01
Peak HR: 87 {beats}/min
Rest HR: 62 {beats}/min

## 2016-02-23 MED ORDER — TECHNETIUM TC 99M TETROFOSMIN IV KIT
32.7000 | PACK | Freq: Once | INTRAVENOUS | Status: AC | PRN
  Administered 2016-02-23: 32.7 via INTRAVENOUS
  Filled 2016-02-23: qty 33

## 2016-02-23 MED ORDER — REGADENOSON 0.4 MG/5ML IV SOLN
0.4000 mg | Freq: Once | INTRAVENOUS | Status: AC
Start: 2016-02-23 — End: 2016-02-23
  Administered 2016-02-23: 0.4 mg via INTRAVENOUS

## 2016-02-23 MED ORDER — TECHNETIUM TC 99M TETROFOSMIN IV KIT
10.8000 | PACK | Freq: Once | INTRAVENOUS | Status: AC | PRN
  Administered 2016-02-23: 11 via INTRAVENOUS
  Filled 2016-02-23: qty 11

## 2016-02-24 ENCOUNTER — Telehealth: Payer: Self-pay | Admitting: Cardiology

## 2016-02-24 DIAGNOSIS — D510 Vitamin B12 deficiency anemia due to intrinsic factor deficiency: Secondary | ICD-10-CM | POA: Diagnosis not present

## 2016-02-24 NOTE — Telephone Encounter (Signed)
New message    Patient calling wants to discuss the possible of cardiac cath .

## 2016-02-24 NOTE — Telephone Encounter (Signed)
Spoke with pt about the results of his recent stress testing.  He is aware to come in to discuss a plan for possible cardiac cath.

## 2016-02-25 ENCOUNTER — Encounter: Payer: Self-pay | Admitting: Cardiology

## 2016-02-25 ENCOUNTER — Encounter: Payer: Self-pay | Admitting: *Deleted

## 2016-02-25 ENCOUNTER — Ambulatory Visit (INDEPENDENT_AMBULATORY_CARE_PROVIDER_SITE_OTHER): Payer: Medicare Other | Admitting: Cardiology

## 2016-02-25 VITALS — BP 122/52 | HR 64 | Ht 67.0 in | Wt 192.4 lb

## 2016-02-25 DIAGNOSIS — Z01810 Encounter for preprocedural cardiovascular examination: Secondary | ICD-10-CM | POA: Diagnosis not present

## 2016-02-25 DIAGNOSIS — I208 Other forms of angina pectoris: Secondary | ICD-10-CM | POA: Diagnosis not present

## 2016-02-25 DIAGNOSIS — Z954 Presence of other heart-valve replacement: Secondary | ICD-10-CM

## 2016-02-25 DIAGNOSIS — Z951 Presence of aortocoronary bypass graft: Secondary | ICD-10-CM | POA: Diagnosis not present

## 2016-02-25 DIAGNOSIS — Z953 Presence of xenogenic heart valve: Secondary | ICD-10-CM

## 2016-02-25 LAB — CBC
HEMATOCRIT: 33.4 % — AB (ref 38.5–50.0)
Hemoglobin: 11.4 g/dL — ABNORMAL LOW (ref 13.2–17.1)
MCH: 27.5 pg (ref 27.0–33.0)
MCHC: 34.1 g/dL (ref 32.0–36.0)
MCV: 80.7 fL (ref 80.0–100.0)
MPV: 10.9 fL (ref 7.5–12.5)
PLATELETS: 196 10*3/uL (ref 140–400)
RBC: 4.14 MIL/uL — ABNORMAL LOW (ref 4.20–5.80)
RDW: 17.6 % — AB (ref 11.0–15.0)
WBC: 7.5 10*3/uL (ref 3.8–10.8)

## 2016-02-25 LAB — BASIC METABOLIC PANEL
BUN: 17 mg/dL (ref 7–25)
CHLORIDE: 105 mmol/L (ref 98–110)
CO2: 23 mmol/L (ref 20–31)
Calcium: 9.9 mg/dL (ref 8.6–10.3)
Creat: 1.14 mg/dL (ref 0.70–1.18)
Glucose, Bld: 86 mg/dL (ref 65–99)
POTASSIUM: 4.3 mmol/L (ref 3.5–5.3)
SODIUM: 141 mmol/L (ref 135–146)

## 2016-02-25 LAB — PROTIME-INR
INR: 1
Prothrombin Time: 10.5 s (ref 9.0–11.5)

## 2016-02-25 MED ORDER — ISOSORBIDE MONONITRATE ER 30 MG PO TB24: 30.0000 mg | ORAL_TABLET | Freq: Every day | ORAL | 11 refills | Status: DC

## 2016-02-25 MED ORDER — PREDNISONE 20 MG PO TABS: 20.0000 mg | ORAL_TABLET | ORAL | 0 refills | Status: DC

## 2016-02-25 NOTE — Progress Notes (Signed)
Cardiology Office Note    Date:  02/25/2016   ID:  Nathan [name], DOB 02-15-1946, MRN [id number]  PCP:  Donnajean Lopes, MD  Cardiologist:   Candee Furbish, MD     History of Present Illness:  Nathan [name] is a 70 y.o. male former patient of Dr. Sherryl Barters with history of coronary artery bypass graft surgery and aortic valve replacement for severe aortic stenosis in April 2010. He has a tissue valve. His last echocardiogram on 06/13/14 showed normal aortic valve prosthetic function and normal left ventricular systolic function with ejection fraction of 55-60%. The patient has a history of high blood pressure, diabetes, and hypercholesterolemia. Also has a history of nightmares and night terrors.   The patient continues to work in a [employer]. 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 He is only working half day on most of the days. He intends to work for another several years until his wife retires from her work.   He has been expressing some chest pressure with walking as well as shortness of breath.  He is diabetic. He has not been having any hypoglycemic episodes. Generally he walks for exercise.  During the winter he gets less exercise.  His weight however has been stable over the winter.  He has not been having any chest pain or shortness of breath or palpitations.  He has a past history of PVCs but has not been aware of any recently.  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 He remembers the [facility] coming to the hospital. Dr. Risa Grill helped save his life he states.He also has dreams, sometimes wakes up in a sweat. Has previously seen a sleep doctor. Could not tolerate CPAP mask. Does not like anything to touch him during sleep.  Nuclear stress test reviewed as below. Abnormal. Anterolateral defect.   Past Medical History:  Diagnosis Date  . Aortic stenosis   . Arthritis   . Blood transfusion without reported  diagnosis   . CAD (coronary artery disease)   . Diabetes mellitus, type 2 ([place])   . Diverticulosis of colon (without mention of hemorrhage)   . Duodenal ulcer perforation ([place])    blood transfusion  . Flesh-eating bacteria ([place]) 1995  . GERD (gastroesophageal reflux disease)   . Heart murmur   . History of diabetic retinopathy   . History of gastrointestinal bleeding   . History of necrotizing fasciitis   . History of prosthetic aortic valve   . Hypercholesteremia   . Nightmares    CHRONIC  . OSA (obstructive sleep apnea)    borderline no CPAP   . Personal history of colonic polyps 11/23/2009   TUBULAR ADENOMA  . Postoperative anemia   . Sinus bradycardia   . Sleep apnea    no cpap  . Stomach ulcer    Hx of  . Systolic hypertension   . Vitamin B12 deficiency     Past Surgical History:  Procedure Laterality Date  . AORTIC VALVE REPLACEMENT    . BLEPHAROPLASTY  11-24-14  . CATARACT EXTRACTION W/ INTRAOCULAR LENS IMPLANT     OD  . CORONARY ARTERY BYPASS GRAFT  2010  . flesh eating disease surgery      surgeries x 7   . [place]  . RETINAL LASER PROCEDURE    . [place]   resection  . UMBILICAL HERNIA REPAIR N/A 04/10/2013   Procedure: HERNIA REPAIR UMBILICAL ADULT with mesh;  Surgeon: Pedro Earls, MD;  Location: [facility];  Service: General;  Laterality: N/A;    Current Medications: Outpatient Medications Prior to Visit  Medication Sig Dispense Refill  . allopurinol (ZYLOPRIM) 300 MG tablet Take 300 mg by mouth daily.    Marland Kitchen ALPRAZolam (XANAX) 0.25 MG tablet Take 0.25 mg by mouth daily as needed for anxiety.     Marland Kitchen amLODipine (NORVASC) 5 MG tablet Take 5 mg by mouth every morning.     . Ascorbic Acid (VITAMIN C) 1000 MG tablet Take 1,000 mg by mouth daily.    Marland Kitchen aspirin 81 MG tablet Take 1 tablet (81 mg total) by mouth daily.    Marland Kitchen atorvastatin (LIPITOR) 20 MG tablet Take 20 mg by mouth daily.  3  . BAYER CONTOUR TEST test strip USE 1 STRIP THREE  TIMES DAILY TO CHECK BLOOD SUGARS.  11  . Cholecalciferol (VITAMIN D3) 1000 UNITS capsule Take 2,000 Units by mouth daily.     Marland Kitchen co-enzyme Q-10 30 MG capsule Take 100 mg by mouth 2 (two) times daily.    . Cyanocobalamin (VITAMIN B-12 IJ) Inject into the muscle every 30 (thirty) days. Amt/dose is unknown    . cyclobenzaprine (FLEXERIL) 10 MG tablet TAKE 1 TABLET BY MOUTH DAILY AS NEEDED FOR MUSCLE CRAMPS  3  . cycloSPORINE (RESTASIS) 0.05 % ophthalmic emulsion Place 1 drop into both eyes 2 (two) times daily.    Marland Kitchen doxycycline (VIBRAMYCIN) 100 MG capsule Take 100 mg by mouth 2 (two) times daily. For 7 days for dentist appointments    . fish oil-omega-3 fatty acids 1000 MG capsule Take 1 g by mouth daily.      Marland Kitchen glipiZIDE (GLUCOTROL) 5 MG tablet Take 5 mg by mouth every morning.     . hydrochlorothiazide (MICROZIDE) 12.5 MG capsule Take 12.5 mg by mouth daily. Reported on 08/20/2015    . HYDROcodone-acetaminophen (VICODIN) 5-500 MG per tablet Take 0.5 tablets by mouth every 6 (six) hours as needed for pain.    Marland Kitchen insulin glargine (LANTUS SOLOSTAR) 100 UNIT/ML injection Inject 18 Units into the skin every morning.     . latanoprost (XALATAN) 0.005 % ophthalmic solution Place 1 drop into both eyes at bedtime.      Marland Kitchen losartan (COZAAR) 50 MG tablet Take 1 tablet (50 mg total) by mouth daily. 90 tablet 1  . metFORMIN (GLUCOPHAGE) 1000 MG tablet Take 1,000 mg by mouth 2 (two) times daily with a meal.      . metoprolol (LOPRESSOR) 50 MG tablet Take 50 mg by mouth 2 (two) times daily.     . minoxidil (LONITEN) 10 MG tablet Take 0.5 tablets by mouth daily.  5  . Multiple Vitamin (MULTIVITAMIN) tablet Take 1 tablet by mouth daily.      Marland Kitchen omeprazole (PRILOSEC) 40 MG capsule Take 40 mg by mouth 2 (two) times daily.  2  . oxyCODONE-acetaminophen (PERCOCET/ROXICET) 5-325 MG tablet Take by mouth every 4 (four) hours as needed for moderate pain or severe pain.    Marland Kitchen oxymetazoline (AFRIN) 0.05 % nasal spray Place 2  sprays into the nose 2 (two) times daily as needed for congestion. Reported on 08/20/2015     No facility-administered medications prior to visit.      Allergies:   Shellfish-derived products and Zolpidem tartrate   Social History   Social History  . Marital status: Married    Spouse name: N/A  . Number of children: 0  . Years of education: N/A   Occupational History  .  [employer] Pharmacist, community   Social History Main Topics  . Smoking status: Never Smoker  . Smokeless tobacco: Never Used  . Alcohol use No  . Drug use: No  . Sexual activity: Not Asked   Other Topics Concern  . None   Social History Narrative  . None     Family History:  The patient's family history includes Alcohol abuse in his father; Cirrhosis in his father; Colon cancer (age of onset: 49) in his mother; Diabetes in his mother and sister; Hypertension in his mother and sister; Rectal cancer in his mother.   ROS:   Please see the history of present illness.    ROS All other systems reviewed and are negative.   PHYSICAL EXAM:   VS:  BP (!) 122/52   Pulse 64   Ht 5\' 7"  (1.702 m)   Wt 192 lb 6.4 oz (87.3 kg)   BMI 30.13 kg/m    GEN: Well nourished, well developed, in no acute distress  HEENT: normal  Neck: no JVD, carotid bruits, or masses Cardiac: RRR; no murmurs, rubs, or gallops,no edema  Respiratory:  clear to auscultation bilaterally, normal work of breathing GI: soft, nontender, nondistended, + BS MS: no deformity or atrophy  Skin: warm and dry, no rash Neuro:  Alert and Oriented x 3, Strength and sensation are intact Psych: euthymic mood, full affect  Wt Readings from Last 3 Encounters:  02/25/16 192 lb 6.4 oz (87.3 kg)  02/09/16 191 lb 9.6 oz (86.9 kg)  08/20/15 201 lb (91.2 kg)      Studies/Labs Reviewed:   EKG:  EKG is ordered today.  The ekg ordered today demonstrates 02/09/16-sinus rhythm, 63, no other abnormalities.  ECHO 06/13/14:  - Left ventricle: The cavity size was  normal. Systolic function was normal. The estimated ejection fraction was in the range of 55% to 60%. Wall motion was normal; there were no regional wall motion abnormalities. - Aortic valve: A bioprosthesis was present and functioning normally. - Right ventricle: The cavity size was mildly dilated. Wall thickness was normal. - Atrial septum: There was increased thickness of the septum, consistent with lipomatous hypertrophy.  Nuclear stress test 2011  - Normal  Cardiac cath 09/30/08:  CORONARY ANGIOGRAPHY:  Left main coronary artery appears normal.   Left anterior descending artery was diffusely heavily calcified.  There  is 80-90% stenosis in the proximal vessel and in the midvessel.  There  are 3 very small diagonal branches.  They have mild atherosclerosis.   The left circumflex coronary artery gives rise to 2 very small marginal  vessels in the proximal mid vessel.  There is a moderate-sized marginal  vessel that is occluded proximally and fills by left-to-left  collaterals.  The both marginal vessel and terminal marginal vessels are  large vessel.  There is an 80% stenosis in the mid circumflex followed  by 90% stenosis before the large terminal branch.  Again, the third  obtuse marginal vessel is occluded.   The right coronary rises and distributes normally.  It is also  moderately calcified.  There is 50-60% disease in the mid right coronary  artery and diffuse 30% disease distally.   FINAL INTERPRETATION:  1. Severe three-vessel obstructive coronary artery disease.  2. Normal left ventricular function.  3. Moderate aortic stenosis.  4. Normal right heart pressures.  CABG op report 10/20/08: Coronary artery bypass grafting x3 with the left  internal mammary to the left anterior descending coronary artery, reverse saphenous vein  graft to circumflex coronary artery, reverse saphenous vein graft to the posterior descending coronary artery and aortic valve  replacement with a pericardial tissue valve.  [employer], [name], 23-mm, serial number [id number], with bilateral thigh endovein harvesting.  SURGEON:  Lanelle Bal, MD   Recent Labs: No results found for requested labs within last 8760 hours.   Lipid Panel No results found for: CHOL, TRIG, HDL, CHOLHDL, VLDL, LDLCALC, LDLDIRECT  Additional studies/ records that were reviewed today include:  Prior office notes reviewed  NUC stress 02/23/16:  Nuclear stress EF: 58%. Apical hypokinesis.  There was no ST segment deviation noted during stress.  Defect 1: There is a medium defect of moderate severity present in the mid anterior, mid anteroseptal and mid anterolateral location. This defect is partially fixed however does demonstrate some reversibility along the anterior/anterolateral distribution.  Findings consistent with prior myocardial infarction with peri-infarct ischemia.  This is an intermediate risk study.    ASSESSMENT:    1. Angina decubitus ([place])   2. Hx of CABG   3. S/P aortic valve replacement with bioprosthetic valve   4. Pre-operative cardiovascular examination      PLAN:  In order of problems listed above:  Chest pain/short of breath  - He is noticing these anginal type symptoms when walking, relieved with rest, mild to moderate severity. He does not recall any particular symptoms prior to his bypass surgery/aortic valve replacement. NUC stress was intermediate risk with defect in the anterior anterolateral distribution that was partially reversible.   - With his symptoms, discussed cath. Risks and benefits (stroke, MI, death, renal, bleeding) discussed.   - We will proceed. Discussion with wife. Remember he has a bioprosthetic aortic valve, do not cross.  - Had a reaction to shellfish in the past-anaphylaxis. We'll go ahead and premedicate with prednisone, Pepcid, Benadryl.  - We will start on isosorbide 30 mg once a day. Long acting nitrate.  Continue with metoprolol.  - Orders for cath written.   Coronary artery disease status post bypass surgery 2011  - decrease aspirin 81 mg  - Continue with metoprolol, atorvastatin 20, losartan   Hyperlipidemia  - Continuing with statin therapy  Obesity  -Encourage weight loss  Diabetes  - Medications reviewed, Dr. Sharlett Iles has been monitoring  Medication Adjustments/Labs and Tests Ordered: Current medicines are reviewed at length with the patient today.  Concerns regarding medicines are outlined above.  Medication changes, Labs and Tests ordered today are listed in the Patient Instructions below. Patient Instructions  Medication Instructions:  Start Isosorbide mononitrate 30 mg a day. Take Prednisone 20 mg (3) tablets 18 hours before your cath, at bedtime the night before and the morning of. Also take Benadryl 25 mg (OTC) and Pepcid 20 mg (OTC) the morning of your cath. Continue all other medications as listed.  Labwork: Please have blood work today. (CBC, BMP and PT/INR)  Testing/Procedures: Your physician has requested that you have a cardiac catheterization. Cardiac catheterization is used to diagnose and/or treat various heart conditions. Doctors may recommend this procedure for a number of different reasons. The most common reason is to evaluate chest pain. Chest pain can be a symptom of coronary artery disease (CAD), and cardiac catheterization can show whether plaque is narrowing or blocking your heart's arteries. This procedure is also used to evaluate the valves, as well as measure the blood flow and oxygen levels in different parts of your heart. For further information please visit HugeFiesta.tn. Please follow instruction sheet, as given.  Follow-Up: Follow up approximately 2 weeks after your cardiac cath.  If you need a refill on your cardiac medications before your next appointment, please call your pharmacy.  Thank you for choosing [facility]!!         Signed, Candee Furbish, MD  02/25/2016 8:44 AM    [facility] [address], [place], [place]  [address] Phone: [phone]; Fax: [phone]

## 2016-02-25 NOTE — Patient Instructions (Signed)
Medication Instructions:  Start Isosorbide mononitrate 30 mg a day. Take Prednisone 20 mg (3) tablets 18 hours before your cath, at bedtime the night before and the morning of. Also take Benadryl 25 mg (OTC) and Pepcid 20 mg (OTC) the morning of your cath. Continue all other medications as listed.  Labwork: Please have blood work today. (CBC, BMP and PT/INR)  Testing/Procedures: Your physician has requested that you have a cardiac catheterization. Cardiac catheterization is used to diagnose and/or treat various heart conditions. Doctors may recommend this procedure for a number of different reasons. The most common reason is to evaluate chest pain. Chest pain can be a symptom of coronary artery disease (CAD), and cardiac catheterization can show whether plaque is narrowing or blocking your heart's arteries. This procedure is also used to evaluate the valves, as well as measure the blood flow and oxygen levels in different parts of your heart. For further information please visit HugeFiesta.tn. Please follow instruction sheet, as given.  Follow-Up: Follow up approximately 2 weeks after your cardiac cath.  If you need a refill on your cardiac medications before your next appointment, please call your pharmacy.  Thank you for choosing [facility]!!

## 2016-02-29 ENCOUNTER — Telehealth: Payer: Self-pay | Admitting: Cardiology

## 2016-02-29 NOTE — Telephone Encounter (Signed)
Pt calling because he is confused about his ASA.  Dr Marlou Porch' instructions were to continue on the 325 mg dose until he runs out and then switch to 81 mg a day.  His medication list has been changed to reflex that order.  Pt is asking if it is OK to continue the 325 mg ASA.  Advised it is and to continue to take his medications as ordered.  He was given instructions for the cardiac cath re: medications to hold and this was reviewed with he prior to leaving the office.  He stated understanding then but reports he starting having questions after someone from the pharmacy called him and told him 325 mg was too much.  Reassured pt of instructions and orders.  He thanked me for the information and was grateful for the call.

## 2016-02-29 NOTE — Telephone Encounter (Signed)
New message      Pt states that he is having a procedure and there is some confusion about the medication. Please call.

## 2016-03-01 ENCOUNTER — Ambulatory Visit (HOSPITAL_COMMUNITY)
Admission: RE | Disposition: A | Discharge status: Home / Self Care | Payer: Self-pay | Source: Ambulatory Visit | Attending: Cardiology

## 2016-03-01 ENCOUNTER — Ambulatory Visit (HOSPITAL_COMMUNITY)
Admission: RE | Admit: 2016-03-01 | Discharge: 2016-03-01 | Disposition: A | Discharge status: Home / Self Care | Payer: Medicare Other | Source: Ambulatory Visit | Attending: Cardiology | Admitting: Cardiology

## 2016-03-01 DIAGNOSIS — E11319 Type 2 diabetes mellitus with unspecified diabetic retinopathy without macular edema: Secondary | ICD-10-CM | POA: Insufficient documentation

## 2016-03-01 DIAGNOSIS — Z833 Family history of diabetes mellitus: Secondary | ICD-10-CM | POA: Diagnosis not present

## 2016-03-01 DIAGNOSIS — Z8719 Personal history of other diseases of the digestive system: Secondary | ICD-10-CM | POA: Insufficient documentation

## 2016-03-01 DIAGNOSIS — M1990 Unspecified osteoarthritis, unspecified site: Secondary | ICD-10-CM | POA: Insufficient documentation

## 2016-03-01 DIAGNOSIS — E669 Obesity, unspecified: Secondary | ICD-10-CM | POA: Insufficient documentation

## 2016-03-01 DIAGNOSIS — Z794 Long term (current) use of insulin: Secondary | ICD-10-CM | POA: Diagnosis not present

## 2016-03-01 DIAGNOSIS — Z86010 Personal history of colonic polyps: Secondary | ICD-10-CM | POA: Diagnosis not present

## 2016-03-01 DIAGNOSIS — Z7984 Long term (current) use of oral hypoglycemic drugs: Secondary | ICD-10-CM | POA: Diagnosis not present

## 2016-03-01 DIAGNOSIS — Z8249 Family history of ischemic heart disease and other diseases of the circulatory system: Secondary | ICD-10-CM | POA: Diagnosis not present

## 2016-03-01 DIAGNOSIS — Z800 Family history of malignant neoplasm of digestive organs: Secondary | ICD-10-CM | POA: Insufficient documentation

## 2016-03-01 DIAGNOSIS — Z7982 Long term (current) use of aspirin: Secondary | ICD-10-CM | POA: Insufficient documentation

## 2016-03-01 DIAGNOSIS — Z953 Presence of xenogenic heart valve: Secondary | ICD-10-CM | POA: Insufficient documentation

## 2016-03-01 DIAGNOSIS — Z6830 Body mass index (BMI) 30.0-30.9, adult: Secondary | ICD-10-CM | POA: Diagnosis not present

## 2016-03-01 DIAGNOSIS — I25119 Atherosclerotic heart disease of native coronary artery with unspecified angina pectoris: Secondary | ICD-10-CM | POA: Diagnosis not present

## 2016-03-01 DIAGNOSIS — G4733 Obstructive sleep apnea (adult) (pediatric): Secondary | ICD-10-CM | POA: Insufficient documentation

## 2016-03-01 DIAGNOSIS — I10 Essential (primary) hypertension: Secondary | ICD-10-CM | POA: Diagnosis not present

## 2016-03-01 DIAGNOSIS — E785 Hyperlipidemia, unspecified: Secondary | ICD-10-CM | POA: Diagnosis not present

## 2016-03-01 DIAGNOSIS — E7800 Pure hypercholesterolemia, unspecified: Secondary | ICD-10-CM | POA: Diagnosis not present

## 2016-03-01 DIAGNOSIS — E538 Deficiency of other specified B group vitamins: Secondary | ICD-10-CM | POA: Insufficient documentation

## 2016-03-01 DIAGNOSIS — I25719 Atherosclerosis of autologous vein coronary artery bypass graft(s) with unspecified angina pectoris: Secondary | ICD-10-CM | POA: Insufficient documentation

## 2016-03-01 DIAGNOSIS — Z951 Presence of aortocoronary bypass graft: Secondary | ICD-10-CM

## 2016-03-01 DIAGNOSIS — I2582 Chronic total occlusion of coronary artery: Secondary | ICD-10-CM | POA: Insufficient documentation

## 2016-03-01 DIAGNOSIS — R9439 Abnormal result of other cardiovascular function study: Secondary | ICD-10-CM | POA: Diagnosis present

## 2016-03-01 DIAGNOSIS — I2510 Atherosclerotic heart disease of native coronary artery without angina pectoris: Secondary | ICD-10-CM | POA: Diagnosis present

## 2016-03-01 DIAGNOSIS — I208 Other forms of angina pectoris: Secondary | ICD-10-CM

## 2016-03-01 DIAGNOSIS — K219 Gastro-esophageal reflux disease without esophagitis: Secondary | ICD-10-CM | POA: Diagnosis not present

## 2016-03-01 HISTORY — PX: CARDIAC CATHETERIZATION: SHX172

## 2016-03-01 LAB — GLUCOSE, CAPILLARY
Glucose-Capillary: 323 mg/dL — ABNORMAL HIGH (ref 65–99)
Glucose-Capillary: 268 mg/dL — ABNORMAL HIGH (ref 65–99)

## 2016-03-01 SURGERY — CORONARY/GRAFT ANGIOGRAPHY

## 2016-03-01 MED ORDER — MIDAZOLAM HCL 2 MG/2ML IJ SOLN
INTRAMUSCULAR | Status: AC
  Filled 2016-03-01: qty 2

## 2016-03-01 MED ORDER — FENTANYL CITRATE (PF) 100 MCG/2ML IJ SOLN
INTRAMUSCULAR | Status: AC
  Filled 2016-03-01: qty 2

## 2016-03-01 MED ORDER — IOPAMIDOL (ISOVUE-370) INJECTION 76%
INTRAVENOUS | Status: DC | PRN
Start: 2016-03-01 — End: 2016-03-01
  Administered 2016-03-01: 110 mL via INTRAVENOUS

## 2016-03-01 MED ORDER — ASPIRIN 81 MG PO CHEW: 81.0000 mg | CHEWABLE_TABLET | ORAL | Status: DC

## 2016-03-01 MED ORDER — VERAPAMIL HCL 2.5 MG/ML IV SOLN
INTRAVENOUS | Status: AC
  Filled 2016-03-01: qty 2

## 2016-03-01 MED ORDER — SODIUM CHLORIDE 0.9% FLUSH: 3.0000 mL | Freq: Two times a day (BID) | INTRAVENOUS | Status: DC

## 2016-03-01 MED ORDER — IOPAMIDOL (ISOVUE-370) INJECTION 76%
INTRAVENOUS | Status: AC
  Filled 2016-03-01: qty 100

## 2016-03-01 MED ORDER — HEPARIN SODIUM (PORCINE) 1000 UNIT/ML IJ SOLN
INTRAMUSCULAR | Status: AC
  Filled 2016-03-01: qty 1

## 2016-03-01 MED ORDER — ASPIRIN 81 MG PO CHEW
CHEWABLE_TABLET | ORAL | Status: AC
  Administered 2016-03-01: 81 mg
  Filled 2016-03-01: qty 1

## 2016-03-01 MED ORDER — INSULIN ASPART 100 UNIT/ML ~~LOC~~ SOLN
SUBCUTANEOUS | Status: AC
  Administered 2016-03-01: 11 [IU]
  Filled 2016-03-01: qty 1

## 2016-03-01 MED ORDER — HEPARIN (PORCINE) IN NACL 2-0.9 UNIT/ML-% IJ SOLN
INTRAMUSCULAR | Status: DC | PRN
  Administered 2016-03-01: 1000 mL

## 2016-03-01 MED ORDER — FENTANYL CITRATE (PF) 100 MCG/2ML IJ SOLN
INTRAMUSCULAR | Status: DC | PRN
  Administered 2016-03-01: 25 ug via INTRAVENOUS

## 2016-03-01 MED ORDER — SODIUM CHLORIDE 0.9 % IV SOLN: 250.0000 mL | INTRAVENOUS | Status: DC | PRN

## 2016-03-01 MED ORDER — SODIUM CHLORIDE 0.9 % WEIGHT BASED INFUSION: 1.0000 mL/kg/h | INTRAVENOUS | Status: DC

## 2016-03-01 MED ORDER — MIDAZOLAM HCL 2 MG/2ML IJ SOLN
INTRAMUSCULAR | Status: DC | PRN
Start: 2016-03-01 — End: 2016-03-01
  Administered 2016-03-01: 1 mg via INTRAVENOUS

## 2016-03-01 MED ORDER — IOPAMIDOL (ISOVUE-370) INJECTION 76%
INTRAVENOUS | Status: AC
  Filled 2016-03-01: qty 50

## 2016-03-01 MED ORDER — SODIUM CHLORIDE 0.9 % WEIGHT BASED INFUSION
3.0000 mL/kg/h | INTRAVENOUS | Status: AC
  Administered 2016-03-01: 3 mL/kg/h via INTRAVENOUS

## 2016-03-01 MED ORDER — SODIUM CHLORIDE 0.9% FLUSH: 3.0000 mL | INTRAVENOUS | Status: DC | PRN

## 2016-03-01 MED ORDER — LIDOCAINE HCL (PF) 1 % IJ SOLN
INTRAMUSCULAR | Status: AC
  Filled 2016-03-01: qty 30

## 2016-03-01 MED ORDER — INSULIN ASPART 100 UNIT/ML ~~LOC~~ SOLN
0.0000 [IU] | Freq: Three times a day (TID) | SUBCUTANEOUS | Status: DC
  Filled 2016-03-01: qty 0.15

## 2016-03-01 MED ORDER — HEPARIN (PORCINE) IN NACL 2-0.9 UNIT/ML-% IJ SOLN
INTRAMUSCULAR | Status: AC
  Filled 2016-03-01: qty 1000

## 2016-03-01 SURGICAL SUPPLY — 10 items
CATH INFINITI 5 FR IM (CATHETERS) ×1 IMPLANT
CATH INFINITI 5FR MULTPACK ANG (CATHETERS) ×1 IMPLANT
GLIDESHEATH SLEND SS 6F .021 (SHEATH) ×1 IMPLANT
KIT HEART LEFT (KITS) ×2 IMPLANT
PACK CARDIAC CATHETERIZATION (CUSTOM PROCEDURE TRAY) ×2 IMPLANT
SHEATH PINNACLE 5F 10CM (SHEATH) ×1 IMPLANT
TRANSDUCER W/STOPCOCK (MISCELLANEOUS) ×3 IMPLANT
TUBING CIL FLEX 10 FLL-RA (TUBING) ×2 IMPLANT
WIRE EMERALD 3MM-J .035X150CM (WIRE) ×1 IMPLANT
WIRE SAFE-T 1.5MM-J .035X260CM (WIRE) ×1 IMPLANT

## 2016-03-01 NOTE — Progress Notes (Signed)
Patient with 5 Fr sheath in right femoral artery. Pain is waxing/waning and gets up to a 5/10. Pedal pulse palpable, +2.

## 2016-03-01 NOTE — Progress Notes (Signed)
5 Fr. Sheath was pulled at 1025. Manual pressure held for 20 minutes. Patient pain free at end of sheath pull. Site is level 0, pedal pulse is palpable, +2. VSS. Patient going to short stay room 12. Bed rest for 4 hours before discharge.

## 2016-03-01 NOTE — Discharge Instructions (Signed)
Angiogram, Care After Refer to this sheet in the next few weeks. These instructions provide you with information about caring for yourself after your procedure. Your health care provider may also give you more specific instructions. Your treatment has been planned according to current medical practices, but problems sometimes occur. Call your health care provider if you have any problems or questions after your procedure. WHAT TO EXPECT AFTER THE PROCEDURE After your procedure, it is typical to have the following:  Bruising at the catheter insertion site that usually fades within 1-2 weeks.  Blood collecting in the tissue (hematoma) that may be painful to the touch. It should usually decrease in size and tenderness within 1-2 weeks. HOME CARE INSTRUCTIONS  Take medicines only as directed by your health care provider.  You may shower 24-48 hours after the procedure or as directed by your health care provider. Remove the bandage (dressing) and gently wash the site with plain soap and water. Pat the area dry with a clean towel. Do not rub the site, because this may cause bleeding.  Do not take baths, swim, or use a hot tub until your health care provider approves.  Check your insertion site every day for redness, swelling, or drainage.  Do not apply powder or lotion to the site.  Do not lift over 10 lb (4.5 kg) for 5 days after your procedure or as directed by your health care provider.  Ask your health care provider when it is okay to:  Return to work or school.  Resume usual physical activities or sports.  Resume sexual activity.  Do not drive home if you are discharged the same day as the procedure. Have someone else drive you.  You may drive 24 hours after the procedure unless otherwise instructed by your health care provider.  Do not operate machinery or power tools for 24 hours after the procedure or as directed by your health care provider.  If your procedure was done as an  outpatient procedure, which means that you went home the same day as your procedure, a responsible adult should be with you for the first 24 hours after you arrive home.  Keep all follow-up visits as directed by your health care provider. This is important. SEEK MEDICAL CARE IF:  You have a fever.  You have chills.  You have increased bleeding from the catheter insertion site. Hold pressure on the site. SEEK IMMEDIATE MEDICAL CARE IF:  You have unusual pain at the catheter insertion site.  You have redness, warmth, or swelling at the catheter insertion site.  You have drainage (other than a small amount of blood on the dressing) from the catheter insertion site.  The catheter insertion site is bleeding, and the bleeding does not stop after 30 minutes of holding steady pressure on the site.  The area near or just beyond the catheter insertion site becomes pale, cool, tingly, or numb.   This information is not intended to replace advice given to you by your health care provider. Make sure you discuss any questions you have with your health care provider.   Document Released: 01/13/2005 Document Revised: 07/18/2014 Document Reviewed: 11/28/2012 Elsevier Interactive Patient Education 2016 [place] An angiogram is an X-ray test. It is used to look at your blood vessels. For this test, a dye is put into the blood vessel being checked. The dye shows up on X-rays. It helps your doctor see if there is a blockage or other problem in the blood vessel.  BEFORE THE PROCEDURE  Follow your doctor's instructions about limiting what you eat or drink.  Ask your doctor if you may drink enough water to take any needed medicines the morning of the test.  Plan to have someone take you home after the test.  If you go home the same day as the test, plan to have someone stay with you for 24 hours. PROCEDURE   An IV tube will be put into one of your veins.  You will be given a medicine  that makes you relax (sedative).  Your skin will be washed and shaved where the thin tube (catheter) will be inserted. This will usually be done in the upper part of your leg (groin). It may also be done in your arm near the elbow or in your wrist.  You will be given a medicine that numbs the area where the tube will be inserted (local anesthetic).  The tube will be inserted into a blood vessel.  Using a type of X-ray (fluoroscopy) to see, your doctor will move the tube into the blood vessel to check it.  Dye will be put in through the tube. X-rays of your blood vessels will then be taken. Different health care providers and hospitals may do this procedure differently. AFTER THE PROCEDURE   If the test is done through the leg, you will be kept in bed lying flat for several hours. You will be told to not bend or cross your legs.   The area where the tube was inserted will be checked often.   The pulse in your feet or wrist will be checked often.   More tests or X-rays may be done.    This information is not intended to replace advice given to you by your health care provider. Make sure you discuss any questions you have with your health care provider.   Document Released: 09/23/2008 Document Revised: 07/18/2014 Document Reviewed: 11/28/2012 Elsevier Interactive Patient Education [employer].

## 2016-03-01 NOTE — Interval H&P Note (Signed)
History and Physical Interval Note:  03/01/2016 8:34 AM  Nathan [name]  has presented today for surgery, with the diagnosis of abnormal stress test  The various methods of treatment have been discussed with the patient and family. After consideration of risks, benefits and other options for treatment, the patient has consented to  Procedure(s): Left Heart Cath and Coronary Angiography (N/A) as a surgical intervention .  The patient's history has been reviewed, patient examined, no change in status, stable for surgery.  I have reviewed the patient's chart and labs.  Questions were answered to the patient's satisfaction.   Cath Lab Visit (complete for each Cath Lab visit)  Clinical Evaluation Leading to the Procedure:   ACS: No.  Non-ACS:    Anginal Classification: CCS III  Anti-ischemic medical therapy: Maximal Therapy (2 or more classes of medications)  Non-Invasive Test Results: Intermediate-risk stress test findings: cardiac mortality 1-3%/year  Prior CABG: Previous CABG        Nathan [name] [facility] 03/01/2016 8:35 AM

## 2016-03-01 NOTE — H&P (View-Only) (Signed)
Cardiology Office Note    Date:  02/25/2016   ID:  Nathan [name], DOB [birth year], MRN [id number]  PCP:  Donnajean Lopes, MD  Cardiologist:   Candee Furbish, MD     History of Present Illness:  Nathan [name] is a 70 y.o. male former patient of Dr. Sherryl Barters with history of coronary artery bypass graft surgery and aortic valve replacement for severe aortic stenosis in April 2010. He has a tissue valve. His last echocardiogram on 06/13/14 showed normal aortic valve prosthetic function and normal left ventricular systolic function with ejection fraction of 55-60%. The patient has a history of high blood pressure, diabetes, and hypercholesterolemia. Also has a history of nightmares and night terrors.   The patient continues to work in a [employer]. 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 He is only working half day on most of the days. He intends to work for another several years until his wife retires from her work.   He has been expressing some chest pressure with walking as well as shortness of breath.  He is diabetic. He has not been having any hypoglycemic episodes. Generally he walks for exercise.  During the winter he gets less exercise.  His weight however has been stable over the winter.  He has not been having any chest pain or shortness of breath or palpitations.  He has a past history of PVCs but has not been aware of any recently.  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 He remembers the [facility] coming to the hospital. Dr. Risa Grill helped save his life he states.He also has dreams, sometimes wakes up in a sweat. Has previously seen a sleep doctor. Could not tolerate CPAP mask. Does not like anything to touch him during sleep.  Nuclear stress test reviewed as below. Abnormal. Anterolateral defect.   Past Medical History:  Diagnosis Date  . Aortic stenosis   . Arthritis   . Blood transfusion without reported  diagnosis   . CAD (coronary artery disease)   . Diabetes mellitus, type 2 ([place])   . Diverticulosis of colon (without mention of hemorrhage)   . Duodenal ulcer perforation ([place])    blood transfusion  . Flesh-eating bacteria ([place]) 1995  . GERD (gastroesophageal reflux disease)   . Heart murmur   . History of diabetic retinopathy   . History of gastrointestinal bleeding   . History of necrotizing fasciitis   . History of prosthetic aortic valve   . Hypercholesteremia   . Nightmares    CHRONIC  . OSA (obstructive sleep apnea)    borderline no CPAP   . Personal history of colonic polyps 11/23/2009   TUBULAR ADENOMA  . Postoperative anemia   . Sinus bradycardia   . Sleep apnea    no cpap  . Stomach ulcer    Hx of  . Systolic hypertension   . Vitamin B12 deficiency     Past Surgical History:  Procedure Laterality Date  . AORTIC VALVE REPLACEMENT    . BLEPHAROPLASTY  11-24-14  . CATARACT EXTRACTION W/ INTRAOCULAR LENS IMPLANT     OD  . CORONARY ARTERY BYPASS GRAFT  2010  . flesh eating disease surgery      surgeries x 7   . [place]  . RETINAL LASER PROCEDURE    . [place]   resection  . UMBILICAL HERNIA REPAIR N/A 04/10/2013   Procedure: HERNIA REPAIR UMBILICAL ADULT with mesh;  Surgeon: Pedro Earls, MD;  Location: [facility];  Service: General;  Laterality: N/A;    Current Medications: Outpatient Medications Prior to Visit  Medication Sig Dispense Refill  . allopurinol (ZYLOPRIM) 300 MG tablet Take 300 mg by mouth daily.    Marland Kitchen ALPRAZolam (XANAX) 0.25 MG tablet Take 0.25 mg by mouth daily as needed for anxiety.     Marland Kitchen amLODipine (NORVASC) 5 MG tablet Take 5 mg by mouth every morning.     . Ascorbic Acid (VITAMIN C) 1000 MG tablet Take 1,000 mg by mouth daily.    Marland Kitchen aspirin 81 MG tablet Take 1 tablet (81 mg total) by mouth daily.    Marland Kitchen atorvastatin (LIPITOR) 20 MG tablet Take 20 mg by mouth daily.  3  . BAYER CONTOUR TEST test strip USE 1 STRIP THREE  TIMES DAILY TO CHECK BLOOD SUGARS.  11  . Cholecalciferol (VITAMIN D3) 1000 UNITS capsule Take 2,000 Units by mouth daily.     Marland Kitchen co-enzyme Q-10 30 MG capsule Take 100 mg by mouth 2 (two) times daily.    . Cyanocobalamin (VITAMIN B-12 IJ) Inject into the muscle every 30 (thirty) days. Amt/dose is unknown    . cyclobenzaprine (FLEXERIL) 10 MG tablet TAKE 1 TABLET BY MOUTH DAILY AS NEEDED FOR MUSCLE CRAMPS  3  . cycloSPORINE (RESTASIS) 0.05 % ophthalmic emulsion Place 1 drop into both eyes 2 (two) times daily.    Marland Kitchen doxycycline (VIBRAMYCIN) 100 MG capsule Take 100 mg by mouth 2 (two) times daily. For 7 days for dentist appointments    . fish oil-omega-3 fatty acids 1000 MG capsule Take 1 g by mouth daily.      Marland Kitchen glipiZIDE (GLUCOTROL) 5 MG tablet Take 5 mg by mouth every morning.     . hydrochlorothiazide (MICROZIDE) 12.5 MG capsule Take 12.5 mg by mouth daily. Reported on 08/20/2015    . HYDROcodone-acetaminophen (VICODIN) 5-500 MG per tablet Take 0.5 tablets by mouth every 6 (six) hours as needed for pain.    Marland Kitchen insulin glargine (LANTUS SOLOSTAR) 100 UNIT/ML injection Inject 18 Units into the skin every morning.     . latanoprost (XALATAN) 0.005 % ophthalmic solution Place 1 drop into both eyes at bedtime.      Marland Kitchen losartan (COZAAR) 50 MG tablet Take 1 tablet (50 mg total) by mouth daily. 90 tablet 1  . metFORMIN (GLUCOPHAGE) 1000 MG tablet Take 1,000 mg by mouth 2 (two) times daily with a meal.      . metoprolol (LOPRESSOR) 50 MG tablet Take 50 mg by mouth 2 (two) times daily.     . minoxidil (LONITEN) 10 MG tablet Take 0.5 tablets by mouth daily.  5  . Multiple Vitamin (MULTIVITAMIN) tablet Take 1 tablet by mouth daily.      Marland Kitchen omeprazole (PRILOSEC) 40 MG capsule Take 40 mg by mouth 2 (two) times daily.  2  . oxyCODONE-acetaminophen (PERCOCET/ROXICET) 5-325 MG tablet Take by mouth every 4 (four) hours as needed for moderate pain or severe pain.    Marland Kitchen oxymetazoline (AFRIN) 0.05 % nasal spray Place 2  sprays into the nose 2 (two) times daily as needed for congestion. Reported on 08/20/2015     No facility-administered medications prior to visit.      Allergies:   Shellfish-derived products and Zolpidem tartrate   Social History   Social History  . Marital status: Married    Spouse name: N/A  . Number of children: 0  . Years of education: N/A   Occupational History  .  [employer] Pharmacist, community   Social History Main Topics  . Smoking status: Never Smoker  . Smokeless tobacco: Never Used  . Alcohol use No  . Drug use: No  . Sexual activity: Not Asked   Other Topics Concern  . None   Social History Narrative  . None     Family History:  The patient's family history includes Alcohol abuse in his father; Cirrhosis in his father; Colon cancer (age of onset: 49) in his mother; Diabetes in his mother and sister; Hypertension in his mother and sister; Rectal cancer in his mother.   ROS:   Please see the history of present illness.    ROS All other systems reviewed and are negative.   PHYSICAL EXAM:   VS:  BP (!) 122/52   Pulse 64   Ht 5\' 7"  (1.702 m)   Wt 192 lb 6.4 oz (87.3 kg)   BMI 30.13 kg/m    GEN: Well nourished, well developed, in no acute distress  HEENT: normal  Neck: no JVD, carotid bruits, or masses Cardiac: RRR; no murmurs, rubs, or gallops,no edema  Respiratory:  clear to auscultation bilaterally, normal work of breathing GI: soft, nontender, nondistended, + BS MS: no deformity or atrophy  Skin: warm and dry, no rash Neuro:  Alert and Oriented x 3, Strength and sensation are intact Psych: euthymic mood, full affect  Wt Readings from Last 3 Encounters:  02/25/16 192 lb 6.4 oz (87.3 kg)  02/09/16 191 lb 9.6 oz (86.9 kg)  08/20/15 201 lb (91.2 kg)      Studies/Labs Reviewed:   EKG:  EKG is ordered today.  The ekg ordered today demonstrates 02/09/16-sinus rhythm, 63, no other abnormalities.  ECHO 06/13/14:  - Left ventricle: The cavity size was  normal. Systolic function was normal. The estimated ejection fraction was in the range of 55% to 60%. Wall motion was normal; there were no regional wall motion abnormalities. - Aortic valve: A bioprosthesis was present and functioning normally. - Right ventricle: The cavity size was mildly dilated. Wall thickness was normal. - Atrial septum: There was increased thickness of the septum, consistent with lipomatous hypertrophy.  Nuclear stress test 2011  - Normal  Cardiac cath 09/30/08:  CORONARY ANGIOGRAPHY:  Left main coronary artery appears normal.   Left anterior descending artery was diffusely heavily calcified.  There  is 80-90% stenosis in the proximal vessel and in the midvessel.  There  are 3 very small diagonal branches.  They have mild atherosclerosis.   The left circumflex coronary artery gives rise to 2 very small marginal  vessels in the proximal mid vessel.  There is a moderate-sized marginal  vessel that is occluded proximally and fills by left-to-left  collaterals.  The both marginal vessel and terminal marginal vessels are  large vessel.  There is an 80% stenosis in the mid circumflex followed  by 90% stenosis before the large terminal branch.  Again, the third  obtuse marginal vessel is occluded.   The right coronary rises and distributes normally.  It is also  moderately calcified.  There is 50-60% disease in the mid right coronary  artery and diffuse 30% disease distally.   FINAL INTERPRETATION:  1. Severe three-vessel obstructive coronary artery disease.  2. Normal left ventricular function.  3. Moderate aortic stenosis.  4. Normal right heart pressures.  CABG op report 10/20/08: Coronary artery bypass grafting x3 with the left  internal mammary to the left anterior descending coronary artery, reverse saphenous vein  graft to circumflex coronary artery, reverse saphenous vein graft to the posterior descending coronary artery and aortic valve  replacement with a pericardial tissue valve.  [employer], [name], 23-mm, serial number [id number], with bilateral thigh endovein harvesting.  SURGEON:  Lanelle Bal, MD   Recent Labs: No results found for requested labs within last 8760 hours.   Lipid Panel No results found for: CHOL, TRIG, HDL, CHOLHDL, VLDL, LDLCALC, LDLDIRECT  Additional studies/ records that were reviewed today include:  Prior office notes reviewed  NUC stress 02/23/16:  Nuclear stress EF: 58%. Apical hypokinesis.  There was no ST segment deviation noted during stress.  Defect 1: There is a medium defect of moderate severity present in the mid anterior, mid anteroseptal and mid anterolateral location. This defect is partially fixed however does demonstrate some reversibility along the anterior/anterolateral distribution.  Findings consistent with prior myocardial infarction with peri-infarct ischemia.  This is an intermediate risk study.    ASSESSMENT:    1. Angina decubitus ([place])   2. Hx of CABG   3. S/P aortic valve replacement with bioprosthetic valve   4. Pre-operative cardiovascular examination      PLAN:  In order of problems listed above:  Chest pain/short of breath  - He is noticing these anginal type symptoms when walking, relieved with rest, mild to moderate severity. He does not recall any particular symptoms prior to his bypass surgery/aortic valve replacement. NUC stress was intermediate risk with defect in the anterior anterolateral distribution that was partially reversible.   - With his symptoms, discussed cath. Risks and benefits (stroke, MI, death, renal, bleeding) discussed.   - We will proceed. Discussion with wife. Remember he has a bioprosthetic aortic valve, do not cross.  - Had a reaction to shellfish in the past-anaphylaxis. We'll go ahead and premedicate with prednisone, Pepcid, Benadryl.  - We will start on isosorbide 30 mg once a day. Long acting nitrate.  Continue with metoprolol.  - Orders for cath written.   Coronary artery disease status post bypass surgery 2011  - decrease aspirin 81 mg  - Continue with metoprolol, atorvastatin 20, losartan   Hyperlipidemia  - Continuing with statin therapy  Obesity  -Encourage weight loss  Diabetes  - Medications reviewed, Dr. Sharlett Iles has been monitoring  Medication Adjustments/Labs and Tests Ordered: Current medicines are reviewed at length with the patient today.  Concerns regarding medicines are outlined above.  Medication changes, Labs and Tests ordered today are listed in the Patient Instructions below. Patient Instructions  Medication Instructions:  Start Isosorbide mononitrate 30 mg a day. Take Prednisone 20 mg (3) tablets 18 hours before your cath, at bedtime the night before and the morning of. Also take Benadryl 25 mg (OTC) and Pepcid 20 mg (OTC) the morning of your cath. Continue all other medications as listed.  Labwork: Please have blood work today. (CBC, BMP and PT/INR)  Testing/Procedures: Your physician has requested that you have a cardiac catheterization. Cardiac catheterization is used to diagnose and/or treat various heart conditions. Doctors may recommend this procedure for a number of different reasons. The most common reason is to evaluate chest pain. Chest pain can be a symptom of coronary artery disease (CAD), and cardiac catheterization can show whether plaque is narrowing or blocking your heart's arteries. This procedure is also used to evaluate the valves, as well as measure the blood flow and oxygen levels in different parts of your heart. For further information please visit [email or web address]. Please follow instruction sheet, as given.  Follow-Up: Follow up approximately 2 weeks after your cardiac cath.  If you need a refill on your cardiac medications before your next appointment, please call your pharmacy.  Thank you for choosing [facility]!!         Signed, Candee Furbish, MD  02/25/2016 8:44 AM    [facility] [place], [place], [place]  [address] Phone: [phone]; Fax: [phone]

## 2016-03-02 ENCOUNTER — Encounter (HOSPITAL_COMMUNITY): Payer: Self-pay | Admitting: Cardiology

## 2016-03-02 MED FILL — Lidocaine HCl Local Preservative Free (PF) Inj 1%: INTRAMUSCULAR | Qty: 30 | Status: AC

## 2016-03-02 MED FILL — Verapamil HCl IV Soln 2.5 MG/ML: INTRAVENOUS | Qty: 2 | Status: AC

## 2016-03-15 ENCOUNTER — Encounter: Payer: Self-pay | Admitting: Physician Assistant

## 2016-03-15 ENCOUNTER — Ambulatory Visit (INDEPENDENT_AMBULATORY_CARE_PROVIDER_SITE_OTHER): Payer: Medicare Other | Admitting: Physician Assistant

## 2016-03-15 VITALS — BP 118/58 | HR 68 | Ht 67.0 in | Wt 190.0 lb

## 2016-03-15 DIAGNOSIS — Z954 Presence of other heart-valve replacement: Secondary | ICD-10-CM | POA: Diagnosis not present

## 2016-03-15 DIAGNOSIS — I10 Essential (primary) hypertension: Secondary | ICD-10-CM | POA: Diagnosis not present

## 2016-03-15 DIAGNOSIS — R0989 Other specified symptoms and signs involving the circulatory and respiratory systems: Secondary | ICD-10-CM | POA: Diagnosis not present

## 2016-03-15 DIAGNOSIS — I25810 Atherosclerosis of coronary artery bypass graft(s) without angina pectoris: Secondary | ICD-10-CM

## 2016-03-15 DIAGNOSIS — E669 Obesity, unspecified: Secondary | ICD-10-CM | POA: Insufficient documentation

## 2016-03-15 DIAGNOSIS — Z952 Presence of prosthetic heart valve: Secondary | ICD-10-CM

## 2016-03-15 MED ORDER — NITROGLYCERIN 0.4 MG SL SUBL: 0.4000 mg | SUBLINGUAL_TABLET | SUBLINGUAL | 11 refills | Status: DC | PRN

## 2016-03-15 NOTE — Progress Notes (Signed)
Cardiology Office Note    Date:  03/15/2016   ID:  Nathan [name], DOB [birth year], MRN [id number]  PCP:  Nathan Lopes, MD  Cardiologist: Dr. Marlou Porch  Chief Complaint  Patient presents with  . Follow-up    History of Present Illness:  Nathan [name] is a 70 y.o. male   with history of coronary artery bypass graft surgery and aortic valve replacement for severe aortic stenosis in April 2010. He has a tissue valve.  His last echocardiogram on 06/13/14 showed normal aortic valve prosthetic function and normal left ventricular systolic function with ejection fraction of 55-60%.  The patient has a history of high blood pressure, diabetes, and hypercholesterolemia. Also has a history of nightmares and night terrors.    The patient continues to work in a [employer].  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 He is only working half day on most of the days. He intends to work for another several years until his wife retires from her work.    Patient was recently admitted to the hospital with increasing angina and abnormal nuclear stress test on 02/23/16. He underwent cardiac catheterization 03/01/16 which reveals severe three-vessel occlusive CAD, patent LIMA to the LAD, patent SVG to the large OM 2, occluded SVG to the PDA. Distal RCA is occluded and fills by left to right collaterals. Medical management recommended.  Patient comes in today nervous because his stress test had showed a prior heart attack and he was worried he had heart failure. I explained that his overall heart function was 58% and he has never had symptoms of CHF. He feels so much better on isosorbide. He does complain of a mild headache in the late afternoon and only last a few minutes. He takes his isosorbide in the morning. He has never really had headaches before. He denies chest pain, palpitations, dyspnea, dyspnea on exertion, dizziness or presyncope.      Past Medical History:    Diagnosis Date  . Aortic stenosis   . Arthritis   . Blood transfusion without reported diagnosis   . CAD (coronary artery disease)   . Diabetes mellitus, type 2 ([place])   . Diverticulosis of colon (without mention of hemorrhage)   . Duodenal ulcer perforation ([place])    blood transfusion  . Flesh-eating bacteria ([place]) 1995  . GERD (gastroesophageal reflux disease)   . Heart murmur   . History of diabetic retinopathy   . History of gastrointestinal bleeding   . History of necrotizing fasciitis   . History of prosthetic aortic valve   . Hypercholesteremia   . Nightmares    CHRONIC  . OSA (obstructive sleep apnea)    borderline no CPAP   . Personal history of colonic polyps 11/23/2009   TUBULAR ADENOMA  . Postoperative anemia   . Sinus bradycardia   . Sleep apnea    no cpap  . Stomach ulcer    Hx of  . Systolic hypertension   . Vitamin B12 deficiency     Past Surgical History:  Procedure Laterality Date  . AORTIC VALVE REPLACEMENT    . BLEPHAROPLASTY  11-24-14  . CARDIAC CATHETERIZATION N/A 03/01/2016   Procedure: Coronary/Graft Angiography;  Surgeon: Peter M Martinique, MD;  Location: [facility];  Service: Cardiovascular;  Laterality: N/A;  . CATARACT EXTRACTION W/ INTRAOCULAR LENS IMPLANT     OD  . CORONARY ARTERY BYPASS GRAFT  2010  . flesh eating disease surgery      surgeries  x 7   . HERNIA REPAIR  1972  . RETINAL LASER PROCEDURE    . [place]   resection  . UMBILICAL HERNIA REPAIR N/A 04/10/2013   Procedure: HERNIA REPAIR UMBILICAL ADULT with mesh;  Surgeon: Pedro Earls, MD;  Location: [facility];  Service: General;  Laterality: N/A;    Current Medications: Outpatient Medications Prior to Visit  Medication Sig Dispense Refill  . allopurinol (ZYLOPRIM) 300 MG tablet Take 300 mg by mouth daily.    Marland Kitchen ALPRAZolam (XANAX) 0.25 MG tablet Take 0.25 mg by mouth daily as needed for anxiety.     Marland Kitchen amLODipine (NORVASC) 5 MG tablet Take 5 mg by mouth every  morning.     . Ascorbic Acid (VITAMIN C) 1000 MG tablet Take 1,000 mg by mouth daily.    Marland Kitchen aspirin 81 MG tablet Take 1 tablet (81 mg total) by mouth daily.    Marland Kitchen atorvastatin (LIPITOR) 20 MG tablet Take 20 mg by mouth daily.  3  . BAYER CONTOUR TEST test strip USE 1 STRIP THREE TIMES DAILY TO CHECK BLOOD SUGARS.  11  . Cholecalciferol (VITAMIN D3) 1000 UNITS capsule Take 10,000 Units by mouth daily.     Marland Kitchen co-enzyme Q-10 30 MG capsule Take 200 mg by mouth 2 (two) times daily.     . Cyanocobalamin (VITAMIN B-12 IJ) Inject 1,000 mcg into the muscle every 30 (thirty) days. Amt/dose is unknown    . cyclobenzaprine (FLEXERIL) 10 MG tablet TAKE 1 TABLET (10 mg) BY MOUTH DAILY AS NEEDED FOR MUSCLE CRAMPS  3  . cycloSPORINE (RESTASIS) 0.05 % ophthalmic emulsion Place 1 drop into both eyes 2 (two) times daily.    Marland Kitchen doxycycline (VIBRAMYCIN) 100 MG capsule Take 100 mg by mouth 2 (two) times daily. For 7 days for dentist appointments    . fish oil-omega-3 fatty acids 1000 MG capsule Take 2 g by mouth daily.     Marland Kitchen glipiZIDE (GLUCOTROL) 5 MG tablet Take 5 mg by mouth every morning.     . hydrochlorothiazide (MICROZIDE) 12.5 MG capsule Take 12.5 mg by mouth daily. Reported on 08/20/2015    . HYDROcodone-acetaminophen (VICODIN) 5-500 MG per tablet Take 1 tablet by mouth daily as needed for pain.     Marland Kitchen insulin glargine (LANTUS SOLOSTAR) 100 UNIT/ML injection Inject 18 Units into the skin every morning.     . isosorbide mononitrate (IMDUR) 30 MG 24 hr tablet Take 1 tablet (30 mg total) by mouth daily. 30 tablet 11  . latanoprost (XALATAN) 0.005 % ophthalmic solution Place 1 drop into both eyes at bedtime.      Marland Kitchen losartan (COZAAR) 50 MG tablet Take 1 tablet (50 mg total) by mouth daily. 90 tablet 1  . metoprolol (LOPRESSOR) 50 MG tablet Take 50 mg by mouth 2 (two) times daily.     . minoxidil (LONITEN) 10 MG tablet Take 10 mg by mouth daily.   5  . Multiple Vitamin (MULTIVITAMIN) tablet Take 1 tablet by mouth daily.       Marland Kitchen omeprazole (PRILOSEC) 40 MG capsule Take 40 mg by mouth 2 (two) times daily.  2  . oxyCODONE-acetaminophen (PERCOCET/ROXICET) 5-325 MG tablet Take 1 tablet by mouth daily as needed for moderate pain or severe pain.     Marland Kitchen oxymetazoline (AFRIN) 0.05 % nasal spray Place 2 sprays into the nose 2 (two) times daily as needed for congestion. Reported on 08/20/2015    . famotidine (PEPCID) 20 MG tablet Take  20 mg by mouth once.     No facility-administered medications prior to visit.      Allergies:   Shellfish-derived products and Zolpidem tartrate   Social History   Social History  . Marital status: Married    Spouse name: N/A  . Number of children: 0  . Years of education: N/A   Occupational History  . SALES Pharmacist, community   Social History Main Topics  . Smoking status: Never Smoker  . Smokeless tobacco: Never Used  . Alcohol use No  . Drug use: No  . Sexual activity: Not Asked   Other Topics Concern  . None   Social History Narrative  . None     Family History:  The patient's   family history includes Alcohol abuse in his father; Cirrhosis in his father; Colon cancer (age of onset: 16) in his mother; Diabetes in his mother and sister; Hypertension in his mother and sister; Rectal cancer in his mother.   ROS:   Please see the history of present illness.    Review of Systems  Constitution: Negative.  HENT: Positive for headaches.   Cardiovascular: Negative.   Respiratory: Negative.   Endocrine: Negative.   Hematologic/Lymphatic: Negative.   Musculoskeletal: Positive for myalgias.  Gastrointestinal: Negative.   Genitourinary: Negative.   Psychiatric/Behavioral: The patient has insomnia and is nervous/anxious.        Night terrors   All other systems reviewed and are negative.   PHYSICAL EXAM:   VS:  BP (!) 118/58   Pulse 68   Ht 5\' 7"  (1.702 m)   Wt 190 lb (86.2 kg)   SpO2 97%   BMI 29.76 kg/m   Physical Exam  [id number], in no acute distress  Neck:  Bilateral carotid bruits, no JVD,  or masses Cardiac:RRR; no murmurs, rubs, or gallops  Respiratory:  clear to auscultation bilaterally, normal work of breathing GI: soft, nontender, nondistended, + BS Ext: Right groin without hematoma or hemorrhage at cath site, without cyanosis, clubbing, or edema, Good distal pulses bilaterally MS: no deformity or atrophy  Skin: warm and dry, no rash Psych: euthymic mood, full affect  Wt Readings from Last 3 Encounters:  03/15/16 190 lb (86.2 kg)  03/01/16 192 lb (87.1 kg)  02/25/16 192 lb 6.4 oz (87.3 kg)      Studies/Labs Reviewed:   EKG:  EKG is not ordered today.    Recent Labs: 02/25/2016: BUN 17; Creat 1.14; Hemoglobin 11.4; Platelets 196; Potassium 4.3; Sodium 141   Lipid Panel No results found for: CHOL, TRIG, HDL, CHOLHDL, VLDL, LDLCALC, LDLDIRECT  Additional studies/ records that were reviewed today include:  Cardiac catheterization 03/01/16 Conclusion     Mid RCA lesion, 50 %stenosed.  Mid RCA to Dist RCA lesion, 100 %stenosed.  Prox Cx to Mid Cx lesion, 99 %stenosed.  Prox LAD to Mid LAD lesion, 100 %stenosed.  SVG to RCA is occluded  Origin lesion, 100 %stenosed.  SVG to OM2 is widely patent.  LIMA and is normal in caliber and anatomically normal.   1. Severe 3 vessel occlusive CAD 2. Patent LIMA to the LAD 3. Patent SVG to large OM2 4. Occluded SVG to PDA. Distal RCA is occluded and fills by left to right collaterals.    Plan: recommend medical management.      NUC stress 02/23/16:  Nuclear stress EF: 58%. Apical hypokinesis.  There was no ST segment deviation noted during stress.  Defect 1: There is a medium defect of  moderate severity present in the mid anterior, mid anteroseptal and mid anterolateral location. This defect is partially fixed however does demonstrate some reversibility along the anterior/anterolateral distribution.  Findings consistent with prior myocardial infarction with peri-infarct  ischemia.  This is an intermediate risk study.   2Decho 2015:  Study Conclusions  - Left ventricle: The cavity size was normal. Systolic function was   normal. The estimated ejection fraction was in the range of 55%   to 60%. Wall motion was normal; there were no regional wall   motion abnormalities. - Aortic valve: A bioprosthesis was present and functioning   normally. - Right ventricle: The cavity size was mildly dilated. Wall   thickness was normal. - Atrial septum: There was increased thickness of the septum,   consistent with lipomatous hypertrophy.      ASSESSMENT:    1. Bilateral carotid bruits   2. Coronary artery disease involving autologous vein coronary bypass graft without angina pectoris   3. Essential hypertension   4. S/P AVR (aortic valve replacement)   5. Obesity (BMI 30-39.9)      PLAN:  In order of problems listed above: Bilateral carotid bruits: Check carotid Dopplers  CAD with previous bypass and recent cardiac catheterization with recommended medical therapy. Patient without angina on isosorbide. He is having mild headaches but would like to continue the medication.  Essential hypertension controlled  Status post aVR tissue valve: Echo in 2015 valve functioning normally. No new symptoms.  Obesity: Exercise and weight loss program recommended.       Medication Adjustments/Labs and Tests Ordered: Current medicines are reviewed at length with the patient today.  Concerns regarding medicines are outlined above.  Medication changes, Labs and Tests ordered today are listed in the Patient Instructions below. Patient Instructions  NTG prescription sent to pharmacy   Scheduled Carotid Dopplers   Your physician recommends that you schedule a follow-up appointment in: 3 to 4 months with Dr.Skains   Heart Healthy Diet Heart-Healthy Eating Plan Many factors influence your heart health, including eating and exercise habits. Heart (coronary) risk  increases with abnormal blood fat (lipid) levels. Heart-healthy meal planning includes limiting unhealthy fats, increasing healthy fats, and making other small dietary changes. This includes maintaining a healthy body weight to help keep lipid levels within a normal range. WHAT IS MY PLAN?  Your health care provider recommends that you:  Get no more than _________% of the total calories in your daily diet from fat.  Limit your intake of saturated fat to less than _________% of your total calories each day.  Limit the amount of cholesterol in your diet to less than _________ mg per day. WHAT TYPES OF FAT SHOULD I CHOOSE?  Choose healthy fats more often. Choose monounsaturated and polyunsaturated fats, such as olive oil and canola oil, flaxseeds, walnuts, almonds, and seeds.  Eat more omega-3 fats. Good choices include salmon, mackerel, sardines, tuna, flaxseed oil, and ground flaxseeds. Aim to eat fish at least two times each week.  Limit saturated fats. Saturated fats are primarily found in animal products, such as meats, butter, and cream. Plant sources of saturated fats include palm oil, palm kernel oil, and coconut oil.  Avoid foods with partially hydrogenated oils in them. These contain trans fats. Examples of foods that contain trans fats are stick margarine, some tub margarines, cookies, crackers, and other baked goods. WHAT GENERAL GUIDELINES DO I NEED TO FOLLOW?  Check food labels carefully to identify foods with trans fats or high amounts  of saturated fat.  Fill one half of your plate with vegetables and green salads. Eat 4-5 servings of vegetables per day. A serving of vegetables equals 1 cup of raw leafy vegetables,  cup of raw or cooked cut-up vegetables, or  cup of vegetable juice.  Fill one fourth of your plate with whole grains. Look for the word "whole" as the first word in the ingredient list.  Fill one fourth of your plate with lean protein foods.  Eat 4-5 servings of  fruit per day. A serving of fruit equals one medium whole fruit,  cup of dried fruit,  cup of fresh, frozen, or canned fruit, or  cup of 100% fruit juice.  Eat more foods that contain soluble fiber. Examples of foods that contain this type of fiber are apples, broccoli, carrots, beans, peas, and barley. Aim to get 20-30 g of fiber per day.  Eat more home-cooked food and less restaurant, buffet, and fast food.  Limit or avoid alcohol.  Limit foods that are high in starch and sugar.  Avoid fried foods.  Cook foods by using methods other than frying. Baking, boiling, grilling, and broiling are all great options. Other fat-reducing suggestions include:  Removing the skin from poultry.  Removing all visible fats from meats.  Skimming the fat off of stews, soups, and gravies before serving them.  Steaming vegetables in water or broth.  Lose weight if you are overweight. Losing just 5-10% of your initial body weight can help your overall health and prevent diseases such as diabetes and heart disease.  Increase your consumption of nuts, legumes, and seeds to 4-5 servings per week. One serving of dried beans or legumes equals  cup after being cooked, one serving of nuts equals 1 ounces, and one serving of seeds equals  ounce or 1 tablespoon.  You may need to monitor your salt (sodium) intake, especially if you have high blood pressure. Talk with your health care provider or dietitian to get more information about reducing sodium. WHAT FOODS CAN I EAT? Grains Breads, including [place], white, pita, wheat, raisin, rye, oatmeal, and [place]. Tortillas that are neither fried nor made with lard or trans fat. Low-fat rolls, including hotdog and hamburger buns and English muffins. Biscuits. Muffins. Waffles. Pancakes. Light popcorn. Whole-grain cereals. Flatbread. Melba toast. Pretzels. Breadsticks. Rusks. Low-fat snacks and crackers, including oyster, saltine, matzo, graham, animal, and rye. Rice  and pasta, including brown rice and those that are made with whole wheat. Vegetables All vegetables. Fruits All fruits, but limit coconut. Meats and Other Protein Sources Lean, well-trimmed beef, veal, pork, and lamb. Chicken and [place] without skin. All fish and shellfish. Wild duck, rabbit, pheasant, and venison. Egg whites or low-cholesterol egg substitutes. Dried beans, peas, lentils, and tofu.Seeds and most nuts. Dairy Low-fat or nonfat cheeses, including ricotta, string, and mozzarella. Skim or 1% milk that is liquid, powdered, or evaporated. Buttermilk that is made with low-fat milk. Nonfat or low-fat yogurt. Beverages Mineral water. Diet carbonated beverages. Sweets and Desserts Sherbets and fruit ices. Honey, jam, marmalade, jelly, and syrups. Meringues and gelatins. Pure sugar candy, such as hard candy, jelly beans, gumdrops, mints, marshmallows, and small amounts of dark chocolate. [employer] Eat all sweets and desserts in moderation. Fats and Oils Nonhydrogenated (trans-free) margarines. Vegetable oils, including soybean, sesame, sunflower, olive, peanut, safflower, corn, canola, and cottonseed. Salad dressings or mayonnaise that are made with a vegetable oil. Limit added fats and oils that you use for cooking, baking, salads, and  as spreads. Other Cocoa powder. Coffee and tea. All seasonings and condiments. The items listed above may not be a complete list of recommended foods or beverages. Contact your dietitian for more options. WHAT FOODS ARE NOT RECOMMENDED? Grains Breads that are made with saturated or trans fats, oils, or whole milk. Croissants. Butter rolls. Cheese breads. Sweet rolls. Donuts. Buttered popcorn. Chow mein noodles. High-fat crackers, such as cheese or butter crackers. Meats and Other Protein Sources Fatty meats, such as hotdogs, short ribs, sausage, spareribs, bacon, ribeye roast or steak, and mutton. High-fat deli meats, such as salami and bologna.  Caviar. Domestic duck and goose. Organ meats, such as kidney, liver, sweetbreads, brains, gizzard, chitterlings, and heart. Dairy Cream, sour cream, cream cheese, and creamed cottage cheese. Whole milk cheeses, including blue (bleu), Monterey Jack, [place], [place], American, [place], Swiss, [place], [place], and [place]. Whole or 2% milk that is liquid, evaporated, or condensed. Whole buttermilk. Cream sauce or high-fat cheese sauce. Yogurt that is made from whole milk. Beverages Regular sodas and drinks with added sugar. Sweets and Desserts Frosting. Pudding. Cookies. Cakes other than angel food cake. Candy that has milk chocolate or white chocolate, hydrogenated fat, butter, coconut, or unknown ingredients. Buttered syrups. Full-fat ice cream or ice cream drinks. Fats and Oils Gravy that has suet, meat fat, or shortening. Cocoa butter, hydrogenated oils, palm oil, coconut oil, palm kernel oil. These can often be found in baked products, candy, fried foods, nondairy creamers, and whipped toppings. Solid fats and shortenings, including bacon fat, salt pork, lard, and butter. Nondairy cream substitutes, such as coffee creamers and sour cream substitutes. Salad dressings that are made of unknown oils, cheese, or sour cream. The items listed above may not be a complete list of foods and beverages to avoid. Contact your dietitian for more information.   This information is not intended to replace advice given to you by your health care provider. Make sure you discuss any questions you have with your health care provider.   Document Released: 04/05/2008 Document Revised: 07/18/2014 Document Reviewed: 12/19/2013 Elsevier Interactive Patient Education 2016 [place], Ermalinda Barrios, [place]  03/15/2016 8:40 AM    [facility], [address], [place]  [address] Phone: [phone]; Fax: [phone]

## 2016-03-15 NOTE — Patient Instructions (Addendum)
NTG prescription sent to pharmacy   Scheduled Carotid Dopplers   Your physician recommends that you schedule a follow-up appointment in: 3 to 4 months with Dr.Skains   Heart Healthy Diet Heart-Healthy Eating Plan Many factors influence your heart health, including eating and exercise habits. Heart (coronary) risk increases with abnormal blood fat (lipid) levels. Heart-healthy meal planning includes limiting unhealthy fats, increasing healthy fats, and making other small dietary changes. This includes maintaining a healthy body weight to help keep lipid levels within a normal range. WHAT IS MY PLAN?  Your health care provider recommends that you:  Get no more than _________% of the total calories in your daily diet from fat.  Limit your intake of saturated fat to less than _________% of your total calories each day.  Limit the amount of cholesterol in your diet to less than _________ mg per day. WHAT TYPES OF FAT SHOULD I CHOOSE?  Choose healthy fats more often. Choose monounsaturated and polyunsaturated fats, such as olive oil and canola oil, flaxseeds, walnuts, almonds, and seeds.  Eat more omega-3 fats. Good choices include salmon, mackerel, sardines, tuna, flaxseed oil, and ground flaxseeds. Aim to eat fish at least two times each week.  Limit saturated fats. Saturated fats are primarily found in animal products, such as meats, butter, and cream. Plant sources of saturated fats include palm oil, palm kernel oil, and coconut oil.  Avoid foods with partially hydrogenated oils in them. These contain trans fats. Examples of foods that contain trans fats are stick margarine, some tub margarines, cookies, crackers, and other baked goods. WHAT GENERAL GUIDELINES DO I NEED TO FOLLOW?  Check food labels carefully to identify foods with trans fats or high amounts of saturated fat.  Fill one half of your plate with vegetables and green salads. Eat 4-5 servings of vegetables per day. A  serving of vegetables equals 1 cup of raw leafy vegetables,  cup of raw or cooked cut-up vegetables, or  cup of vegetable juice.  Fill one fourth of your plate with whole grains. Look for the word "whole" as the first word in the ingredient list.  Fill one fourth of your plate with lean protein foods.  Eat 4-5 servings of fruit per day. A serving of fruit equals one medium whole fruit,  cup of dried fruit,  cup of fresh, frozen, or canned fruit, or  cup of 100% fruit juice.  Eat more foods that contain soluble fiber. Examples of foods that contain this type of fiber are apples, broccoli, carrots, beans, peas, and barley. Aim to get 20-30 g of fiber per day.  Eat more home-cooked food and less restaurant, buffet, and fast food.  Limit or avoid alcohol.  Limit foods that are high in starch and sugar.  Avoid fried foods.  Cook foods by using methods other than frying. Baking, boiling, grilling, and broiling are all great options. Other fat-reducing suggestions include:  Removing the skin from poultry.  Removing all visible fats from meats.  Skimming the fat off of stews, soups, and gravies before serving them.  Steaming vegetables in water or broth.  Lose weight if you are overweight. Losing just 5-10% of your initial body weight can help your overall health and prevent diseases such as diabetes and heart disease.  Increase your consumption of nuts, legumes, and seeds to 4-5 servings per week. One serving of dried beans or legumes equals  cup after being cooked, one serving of nuts equals 1 ounces, and one serving of seeds  equals  ounce or 1 tablespoon.  You may need to monitor your salt (sodium) intake, especially if you have high blood pressure. Talk with your health care provider or dietitian to get more information about reducing sodium. WHAT FOODS CAN I EAT? Grains Breads, including [place], white, pita, wheat, raisin, rye, oatmeal, and [place]. Tortillas that are neither  fried nor made with lard or trans fat. Low-fat rolls, including hotdog and hamburger buns and English muffins. Biscuits. Muffins. Waffles. Pancakes. Light popcorn. Whole-grain cereals. Flatbread. Melba toast. Pretzels. Breadsticks. Rusks. Low-fat snacks and crackers, including oyster, saltine, matzo, graham, animal, and rye. Rice and pasta, including brown rice and those that are made with whole wheat. Vegetables All vegetables. Fruits All fruits, but limit coconut. Meats and Other Protein Sources Lean, well-trimmed beef, veal, pork, and lamb. Chicken and [place] without skin. All fish and shellfish. Wild duck, rabbit, pheasant, and venison. Egg whites or low-cholesterol egg substitutes. Dried beans, peas, lentils, and tofu.Seeds and most nuts. Dairy Low-fat or nonfat cheeses, including ricotta, string, and mozzarella. Skim or 1% milk that is liquid, powdered, or evaporated. Buttermilk that is made with low-fat milk. Nonfat or low-fat yogurt. Beverages Mineral water. Diet carbonated beverages. Sweets and Desserts Sherbets and fruit ices. Honey, jam, marmalade, jelly, and syrups. Meringues and gelatins. Pure sugar candy, such as hard candy, jelly beans, gumdrops, mints, marshmallows, and small amounts of dark chocolate. [employer] Eat all sweets and desserts in moderation. Fats and Oils Nonhydrogenated (trans-free) margarines. Vegetable oils, including soybean, sesame, sunflower, olive, peanut, safflower, corn, canola, and cottonseed. Salad dressings or mayonnaise that are made with a vegetable oil. Limit added fats and oils that you use for cooking, baking, salads, and as spreads. Other Cocoa powder. Coffee and tea. All seasonings and condiments. The items listed above may not be a complete list of recommended foods or beverages. Contact your dietitian for more options. WHAT FOODS ARE NOT RECOMMENDED? Grains Breads that are made with saturated or trans fats, oils, or whole milk.  Croissants. Butter rolls. Cheese breads. Sweet rolls. Donuts. Buttered popcorn. Chow mein noodles. High-fat crackers, such as cheese or butter crackers. Meats and Other Protein Sources Fatty meats, such as hotdogs, short ribs, sausage, spareribs, bacon, ribeye roast or steak, and mutton. High-fat deli meats, such as salami and bologna. Caviar. Domestic duck and goose. Organ meats, such as kidney, liver, sweetbreads, brains, gizzard, chitterlings, and heart. Dairy Cream, sour cream, cream cheese, and creamed cottage cheese. Whole milk cheeses, including blue (bleu), Monterey Jack, [place], [place], American, [place], Swiss, [place], [place], and [place]. Whole or 2% milk that is liquid, evaporated, or condensed. Whole buttermilk. Cream sauce or high-fat cheese sauce. Yogurt that is made from whole milk. Beverages Regular sodas and drinks with added sugar. Sweets and Desserts Frosting. Pudding. Cookies. Cakes other than angel food cake. Candy that has milk chocolate or white chocolate, hydrogenated fat, butter, coconut, or unknown ingredients. Buttered syrups. Full-fat ice cream or ice cream drinks. Fats and Oils Gravy that has suet, meat fat, or shortening. Cocoa butter, hydrogenated oils, palm oil, coconut oil, palm kernel oil. These can often be found in baked products, candy, fried foods, nondairy creamers, and whipped toppings. Solid fats and shortenings, including bacon fat, salt pork, lard, and butter. Nondairy cream substitutes, such as coffee creamers and sour cream substitutes. Salad dressings that are made of unknown oils, cheese, or sour cream. The items listed above may not be a complete list of foods and beverages to avoid. Contact your  dietitian for more information.   This information is not intended to replace advice given to you by your health care provider. Make sure you discuss any questions you have with your health care provider.   Document Released: 04/05/2008 Document Revised:  07/18/2014 Document Reviewed: 12/19/2013 Elsevier Interactive Patient Education [employer].

## 2016-03-23 ENCOUNTER — Ambulatory Visit (HOSPITAL_COMMUNITY)
Admission: RE | Admit: 2016-03-23 | Discharge: 2016-03-23 | Disposition: A | Discharge status: Home / Self Care | Payer: Medicare Other | Source: Ambulatory Visit | Attending: Cardiovascular Disease | Admitting: Cardiovascular Disease

## 2016-03-23 DIAGNOSIS — R0989 Other specified symptoms and signs involving the circulatory and respiratory systems: Secondary | ICD-10-CM | POA: Insufficient documentation

## 2016-03-23 DIAGNOSIS — E785 Hyperlipidemia, unspecified: Secondary | ICD-10-CM | POA: Diagnosis not present

## 2016-03-23 DIAGNOSIS — E119 Type 2 diabetes mellitus without complications: Secondary | ICD-10-CM | POA: Diagnosis not present

## 2016-03-23 DIAGNOSIS — I2510 Atherosclerotic heart disease of native coronary artery without angina pectoris: Secondary | ICD-10-CM | POA: Insufficient documentation

## 2016-03-23 DIAGNOSIS — I10 Essential (primary) hypertension: Secondary | ICD-10-CM | POA: Insufficient documentation

## 2016-03-23 DIAGNOSIS — I6523 Occlusion and stenosis of bilateral carotid arteries: Secondary | ICD-10-CM | POA: Diagnosis not present

## 2016-04-05 ENCOUNTER — Encounter: Payer: Self-pay | Admitting: *Deleted

## 2016-04-05 ENCOUNTER — Telehealth: Payer: Self-pay | Admitting: *Deleted

## 2016-04-05 NOTE — Telephone Encounter (Signed)
Pt returned my call and he was made aware of his Carotid US.   Letter was shredded.

## 2016-04-13 DIAGNOSIS — D510 Vitamin B12 deficiency anemia due to intrinsic factor deficiency: Secondary | ICD-10-CM | POA: Diagnosis not present

## 2016-04-25 DIAGNOSIS — H26491 Other secondary cataract, right eye: Secondary | ICD-10-CM | POA: Diagnosis not present

## 2016-04-25 DIAGNOSIS — E11319 Type 2 diabetes mellitus with unspecified diabetic retinopathy without macular edema: Secondary | ICD-10-CM | POA: Diagnosis not present

## 2016-04-25 DIAGNOSIS — Z961 Presence of intraocular lens: Secondary | ICD-10-CM | POA: Diagnosis not present

## 2016-04-25 DIAGNOSIS — I10 Essential (primary) hypertension: Secondary | ICD-10-CM | POA: Diagnosis not present

## 2016-04-26 DIAGNOSIS — I2510 Atherosclerotic heart disease of native coronary artery without angina pectoris: Secondary | ICD-10-CM | POA: Diagnosis not present

## 2016-04-26 DIAGNOSIS — Z23 Encounter for immunization: Secondary | ICD-10-CM | POA: Diagnosis not present

## 2016-04-26 DIAGNOSIS — Z6831 Body mass index (BMI) 31.0-31.9, adult: Secondary | ICD-10-CM | POA: Diagnosis not present

## 2016-04-26 DIAGNOSIS — E784 Other hyperlipidemia: Secondary | ICD-10-CM | POA: Diagnosis not present

## 2016-04-26 DIAGNOSIS — E1139 Type 2 diabetes mellitus with other diabetic ophthalmic complication: Secondary | ICD-10-CM | POA: Diagnosis not present

## 2016-04-26 DIAGNOSIS — G4733 Obstructive sleep apnea (adult) (pediatric): Secondary | ICD-10-CM | POA: Diagnosis not present

## 2016-04-26 DIAGNOSIS — I10 Essential (primary) hypertension: Secondary | ICD-10-CM | POA: Diagnosis not present

## 2016-05-03 DIAGNOSIS — H4311 Vitreous hemorrhage, right eye: Secondary | ICD-10-CM | POA: Diagnosis not present

## 2016-05-03 DIAGNOSIS — E113591 Type 2 diabetes mellitus with proliferative diabetic retinopathy without macular edema, right eye: Secondary | ICD-10-CM | POA: Diagnosis not present

## 2016-05-03 DIAGNOSIS — E113493 Type 2 diabetes mellitus with severe nonproliferative diabetic retinopathy without macular edema, bilateral: Secondary | ICD-10-CM | POA: Diagnosis not present

## 2016-05-03 DIAGNOSIS — H401132 Primary open-angle glaucoma, bilateral, moderate stage: Secondary | ICD-10-CM | POA: Diagnosis not present

## 2016-05-09 DIAGNOSIS — E113591 Type 2 diabetes mellitus with proliferative diabetic retinopathy without macular edema, right eye: Secondary | ICD-10-CM | POA: Diagnosis not present

## 2016-05-10 ENCOUNTER — Ambulatory Visit (INDEPENDENT_AMBULATORY_CARE_PROVIDER_SITE_OTHER): Payer: Medicare Other | Admitting: Podiatry

## 2016-05-10 ENCOUNTER — Encounter: Payer: Self-pay | Admitting: Podiatry

## 2016-05-10 DIAGNOSIS — B351 Tinea unguium: Secondary | ICD-10-CM | POA: Diagnosis not present

## 2016-05-10 DIAGNOSIS — M79676 Pain in unspecified toe(s): Secondary | ICD-10-CM

## 2016-05-10 DIAGNOSIS — E1142 Type 2 diabetes mellitus with diabetic polyneuropathy: Secondary | ICD-10-CM

## 2016-05-11 NOTE — Progress Notes (Signed)
Nathan [name] presents today for chief complaint of painful elongated toenails.  Objective: Vital signs are stable alert and oriented 3. Pulses are palpable. Nails are thick yellow dystrophic onychomycotic.  Assessment: Pain and limited secondary to onychomycosis.  Plan: Debridement of toenails 1 through 5 bilateral.

## 2016-05-16 DIAGNOSIS — Z961 Presence of intraocular lens: Secondary | ICD-10-CM | POA: Diagnosis not present

## 2016-05-16 DIAGNOSIS — H26492 Other secondary cataract, left eye: Secondary | ICD-10-CM | POA: Diagnosis not present

## 2016-05-18 DIAGNOSIS — D510 Vitamin B12 deficiency anemia due to intrinsic factor deficiency: Secondary | ICD-10-CM | POA: Diagnosis not present

## 2016-06-16 ENCOUNTER — Encounter: Payer: Self-pay | Admitting: Cardiology

## 2016-06-16 ENCOUNTER — Ambulatory Visit (INDEPENDENT_AMBULATORY_CARE_PROVIDER_SITE_OTHER): Payer: Medicare Other | Admitting: Cardiology

## 2016-06-16 VITALS — BP 138/76 | HR 70 | Ht 67.0 in | Wt 192.6 lb

## 2016-06-16 DIAGNOSIS — I208 Other forms of angina pectoris: Secondary | ICD-10-CM

## 2016-06-16 DIAGNOSIS — Z951 Presence of aortocoronary bypass graft: Secondary | ICD-10-CM | POA: Diagnosis not present

## 2016-06-16 DIAGNOSIS — I10 Essential (primary) hypertension: Secondary | ICD-10-CM

## 2016-06-16 DIAGNOSIS — I25810 Atherosclerosis of coronary artery bypass graft(s) without angina pectoris: Secondary | ICD-10-CM | POA: Diagnosis not present

## 2016-06-16 DIAGNOSIS — Z953 Presence of xenogenic heart valve: Secondary | ICD-10-CM | POA: Diagnosis not present

## 2016-06-16 NOTE — Progress Notes (Signed)
Cardiology Office Note    Date:  06/16/2016   ID:  Nathan [name], DOB [birth year], MRN [id number]  PCP:  Nathan Lopes, MD  Cardiologist:   Nathan Furbish, MD     History of Present Illness:  Nathan [name] is a 70 y.o. male former patient of Dr. Sherryl Barters with history of coronary artery bypass graft surgery and aortic valve replacement for severe aortic stenosis in April 2010. He has a tissue valve.  He underwent cardiac catheterization on 03/01/16 after experiencing angina-like symptoms and having a nuclear stress test with anterolateral defect. See below for details, SVG to RCA was the only occluded graft. Continued with medical management.  He feels more energy with the isosorbide. Continue.   His last echocardiogram on 06/13/14 showed normal aortic valve prosthetic function and normal left ventricular systolic function with ejection fraction of 55-60%. The patient has a history of high blood pressure, diabetes, and hypercholesterolemia. Also has a history of nightmares and night terrors.   The patient continues to work in a [employer]. 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 He is only working half day on most of the days. He intends to work for another several years until his wife retires from her work.   He is diabetic. He has not been having any hypoglycemic episodes. Generally he walks for exercise.  During the winter he gets less exercise.  His weight however has been stable.  He has not been having any chest pain or shortness of breath or palpitations.  He has a past history of PVCs but has not been aware of any recently.  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 He remembers the CDC coming to the hospital. Dr. Risa Grill helped save his life he states.He also has dreams, sometimes wakes up in a sweat. Has previously seen a sleep doctor. Could not tolerate CPAP mask. Does not like anything to touch him during  sleep.     Past Medical History:  Diagnosis Date  . Aortic stenosis   . Arthritis   . Blood transfusion without reported diagnosis   . CAD (coronary artery disease)   . Diabetes mellitus, type 2 ([place])   . Diverticulosis of colon (without mention of hemorrhage)   . Duodenal ulcer perforation ([place])    blood transfusion  . 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 GERD (gastroesophageal reflux disease)   . Heart murmur   . History of diabetic retinopathy   . History of gastrointestinal bleeding   . History of necrotizing fasciitis   . History of prosthetic aortic valve   . Hypercholesteremia   . Nightmares    CHRONIC  . OSA (obstructive sleep apnea)    borderline no CPAP   . Personal history of colonic polyps 11/23/2009   TUBULAR ADENOMA  . Postoperative anemia   . Sinus bradycardia   . Sleep apnea    no cpap  . Stomach ulcer    Hx of  . Systolic hypertension   . Vitamin B12 deficiency     Past Surgical History:  Procedure Laterality Date  . AORTIC VALVE REPLACEMENT    . BLEPHAROPLASTY  11-24-14  . CARDIAC CATHETERIZATION N/A 03/01/2016   Procedure: Coronary/Graft Angiography;  Surgeon: Peter M Martinique, MD;  Location: [facility];  Service: Cardiovascular;  Laterality: N/A;  . CATARACT EXTRACTION W/ INTRAOCULAR LENS IMPLANT     OD  . CORONARY ARTERY BYPASS GRAFT  2010  . flesh eating disease surgery  surgeries x 7   . [place]  . RETINAL LASER PROCEDURE    . [place]   resection  . UMBILICAL HERNIA REPAIR N/A 04/10/2013   Procedure: HERNIA REPAIR UMBILICAL ADULT with mesh;  Surgeon: Pedro Earls, MD;  Location: [facility];  Service: General;  Laterality: N/A;    Current Medications: Outpatient Medications Prior to Visit  Medication Sig Dispense Refill  . allopurinol (ZYLOPRIM) 300 MG tablet Take 300 mg by mouth daily.    Marland Kitchen ALPRAZolam (XANAX) 0.25 MG tablet Take 0.25 mg by mouth daily as needed for anxiety.     Marland Kitchen amLODipine (NORVASC)  5 MG tablet Take 5 mg by mouth every morning.     . Ascorbic Acid (VITAMIN C) 1000 MG tablet Take 1,000 mg by mouth daily.    Marland Kitchen aspirin 81 MG tablet Take 1 tablet (81 mg total) by mouth daily.    Marland Kitchen atorvastatin (LIPITOR) 20 MG tablet Take 20 mg by mouth daily.  3  . BAYER CONTOUR TEST test strip USE 1 STRIP THREE TIMES DAILY TO CHECK BLOOD SUGARS.  11  . Cholecalciferol (VITAMIN D3) 1000 UNITS capsule Take 10,000 Units by mouth daily.     Marland Kitchen co-enzyme Q-10 30 MG capsule Take 200 mg by mouth 2 (two) times daily.     . Cyanocobalamin (VITAMIN B-12 IJ) Inject 1,000 mcg into the muscle every 30 (thirty) days. Amt/dose is unknown    . cyclobenzaprine (FLEXERIL) 10 MG tablet TAKE 1 TABLET (10 mg) BY MOUTH DAILY AS NEEDED FOR MUSCLE CRAMPS  3  . cycloSPORINE (RESTASIS) 0.05 % ophthalmic emulsion Place 1 drop into both eyes 2 (two) times daily.    Marland Kitchen doxycycline (VIBRAMYCIN) 100 MG capsule Take 100 mg by mouth 2 (two) times daily. For 7 days for dentist appointments    . fish oil-omega-3 fatty acids 1000 MG capsule Take 2 g by mouth daily.     Marland Kitchen glipiZIDE (GLUCOTROL) 5 MG tablet Take 5 mg by mouth every morning.     . hydrochlorothiazide (MICROZIDE) 12.5 MG capsule Take 12.5 mg by mouth daily. Reported on 08/20/2015    . HYDROcodone-acetaminophen (VICODIN) 5-500 MG per tablet Take 1 tablet by mouth daily as needed for pain.     Marland Kitchen latanoprost (XALATAN) 0.005 % ophthalmic solution Place 1 drop into both eyes at bedtime.      Marland Kitchen losartan (COZAAR) 50 MG tablet Take 1 tablet (50 mg total) by mouth daily. 90 tablet 1  . metFORMIN (GLUCOPHAGE) 1000 MG tablet Take 1,000 mg by mouth daily with breakfast.    . metoprolol (LOPRESSOR) 50 MG tablet Take 50 mg by mouth 2 (two) times daily.     . minoxidil (LONITEN) 10 MG tablet Take 10 mg by mouth daily.   5  . Multiple Vitamin (MULTIVITAMIN) tablet Take 1 tablet by mouth daily.      Marland Kitchen omeprazole (PRILOSEC) 40 MG capsule Take 40 mg by mouth 2 (two) times daily.  2  .  oxyCODONE-acetaminophen (PERCOCET/ROXICET) 5-325 MG tablet Take 1 tablet by mouth daily as needed for moderate pain or severe pain.     Marland Kitchen oxymetazoline (AFRIN) 0.05 % nasal spray Place 2 sprays into the nose 2 (two) times daily as needed for congestion. Reported on 08/20/2015    . isosorbide mononitrate (IMDUR) 30 MG 24 hr tablet Take 1 tablet (30 mg total) by mouth daily. 30 tablet 11  . nitroGLYCERIN (NITROSTAT) 0.4 MG SL tablet Place 1  tablet (0.4 mg total) under the tongue every 5 (five) minutes as needed for chest pain. 25 tablet 11  . insulin glargine (LANTUS SOLOSTAR) 100 UNIT/ML injection Inject 18 Units into the skin every morning.      No facility-administered medications prior to visit.      Allergies:   Shellfish-derived products and Zolpidem tartrate   Social History   Social History  . Marital status: Married    Spouse name: N/A  . Number of children: 0  . Years of education: N/A   Occupational History  . SALES Pharmacist, community   Social History Main Topics  . Smoking status: Never Smoker  . Smokeless tobacco: Never Used  . Alcohol use No  . Drug use: No  . Sexual activity: Not Asked   Other Topics Concern  . None   Social History Narrative  . None     Family History:  The patient's family history includes Alcohol abuse in his father; Cirrhosis in his father; Colon cancer (age of onset: 67) in his mother; Diabetes in his mother and sister; Hypertension in his mother and sister; Rectal cancer in his mother.   ROS:   Please see the history of present illness.    ROS All other systems reviewed and are negative.   PHYSICAL EXAM:   VS:  BP 138/76   Pulse 70   Ht 5\' 7"  (1.702 m)   Wt 192 lb 9.6 oz (87.4 kg)   BMI 30.17 kg/m    GEN: Well nourished, well developed, in no acute distress  HEENT: normal  Neck: no JVD, carotid bruits, or masses Cardiac: RRR; no murmurs, rubs, or gallops,no edema  Respiratory:  clear to auscultation bilaterally, normal work of  breathing GI: soft, nontender, nondistended, + BS MS: no deformity or atrophy  Skin: warm and dry, no rash Neuro:  Alert and Oriented x 3, Strength and sensation are intact Psych: euthymic mood, full affect  Wt Readings from Last 3 Encounters:  06/16/16 192 lb 9.6 oz (87.4 kg)  03/15/16 190 lb (86.2 kg)  03/01/16 192 lb (87.1 kg)      Studies/Labs Reviewed:   EKG:  EKG is ordered today.  The ekg ordered today demonstrates 02/09/16-sinus rhythm, 63, no other abnormalities.  ECHO 06/13/14:  - Left ventricle: The cavity size was normal. Systolic function was normal. The estimated ejection fraction was in the range of 55% to 60%. Wall motion was normal; there were no regional wall motion abnormalities. - Aortic valve: A bioprosthesis was present and functioning normally. - Right ventricle: The cavity size was mildly dilated. Wall thickness was normal. - Atrial septum: There was increased thickness of the septum, consistent with lipomatous hypertrophy.  Nuclear stress test 2011  - Normal  Cardiac cath 09/30/08:  CORONARY ANGIOGRAPHY:  Left main coronary artery appears normal.   Left anterior descending artery was diffusely heavily calcified.  There  is 80-90% stenosis in the proximal vessel and in the midvessel.  There  are 3 very small diagonal branches.  They have mild atherosclerosis.   The left circumflex coronary artery gives rise to 2 very small marginal  vessels in the proximal mid vessel.  There is a moderate-sized marginal  vessel that is occluded proximally and fills by left-to-left  collaterals.  The both marginal vessel and terminal marginal vessels are  large vessel.  There is an 80% stenosis in the mid circumflex followed  by 90% stenosis before the large terminal branch.  Again, the third  obtuse marginal vessel is occluded.   The right coronary rises and distributes normally.  It is also  moderately calcified.  There is 50-60% disease in the mid  right coronary  artery and diffuse 30% disease distally.   FINAL INTERPRETATION:  1. Severe three-vessel obstructive coronary artery disease.  2. Normal left ventricular function.  3. Moderate aortic stenosis.  4. Normal right heart pressures.  CABG op report 10/20/08: Coronary artery bypass grafting x3 with the left  internal mammary to the left anterior descending coronary artery, reverse saphenous vein graft to circumflex coronary artery, reverse saphenous vein graft to the posterior descending coronary artery and aortic valve replacement with a pericardial tissue valve.  [employer], [name], 23-mm, serial number [id number], with bilateral thigh endovein harvesting.  SURGEON:  Lanelle Bal, MD   Recent Labs: 02/25/2016: BUN 17; Creat 1.14; Hemoglobin 11.4; Platelets 196; Potassium 4.3; Sodium 141   Lipid Panel No results found for: CHOL, TRIG, HDL, CHOLHDL, VLDL, LDLCALC, LDLDIRECT  Additional studies/ records that were reviewed today include:  Prior office notes reviewed  NUC stress 02/23/16:  Nuclear stress EF: 58%. Apical hypokinesis.  There was no ST segment deviation noted during stress.  Defect 1: There is a medium defect of moderate severity present in the mid anterior, mid anteroseptal and mid anterolateral location. This defect is partially fixed however does demonstrate some reversibility along the anterior/anterolateral distribution.  Findings consistent with prior myocardial infarction with peri-infarct ischemia.  This is an intermediate risk study.  Cath 03/01/16:  Mid RCA lesion, 50 %stenosed.  Mid RCA to Dist RCA lesion, 100 %stenosed.  Prox Cx to Mid Cx lesion, 99 %stenosed.  Prox LAD to Mid LAD lesion, 100 %stenosed.  SVG to RCA is occluded  Origin lesion, 100 %stenosed.  SVG to OM2 is widely patent.  LIMA and is normal in caliber and anatomically normal.   1. Severe 3 vessel occlusive CAD 2. Patent LIMA to the LAD 3. Patent  SVG to large OM2 4. Occluded SVG to PDA. Distal RCA is occluded and fills by left to right collaterals.   Plan: recommend medical management.   ASSESSMENT:    1. Coronary artery disease involving autologous vein coronary bypass graft without angina pectoris   2. Hx of CABG   3. S/P aortic valve replacement with bioprosthetic valve   4. Angina decubitus ([place])   5. Essential hypertension      PLAN:  In order of problems listed above:  Chest pain/short of breath/Angina  - Reassuring cardiac catheterization-medical management 03/01/16  -  He does not recall any particular symptoms prior to his bypass surgery/aortic valve replacement. NUC stress was intermediate risk with defect in the anterior anterolateral distribution that was partially reversible.   - isosorbide 30 mg once a day. Long acting nitrate. Continue with metoprolol.   Coronary artery disease status post bypass surgery 2011  - decrease aspirin 81 mg  - Continue with metoprolol, atorvastatin 20, losartan   Hyperlipidemia  - Continuing with statin therapy  Obesity  -Encourage weight loss  Diabetes  - Medications reviewed, Dr. Sharlett Iles has been monitoring  Medication Adjustments/Labs and Tests Ordered: Current medicines are reviewed at length with the patient today.  Concerns regarding medicines are outlined above.  Medication changes, Labs and Tests ordered today are listed in the Patient Instructions below. Patient Instructions  Medication Instructions:  The current medical regimen is effective;  continue present plan and medications.  Follow-Up: Follow up in 6 months with Dr. Marlou Porch.  You will receive a letter in the mail 2 months before you are due.  Please call us when you receive this letter to schedule your follow up appointment.  If you need a refill on your cardiac medications before your next appointment, please call your pharmacy.  Thank you for choosing [facility]!!         Signed, Nathan Furbish, MD  06/16/2016 4:47 PM    Nathan [name], [place], [place]  [address] Phone: [phone]; Fax: [phone]

## 2016-06-16 NOTE — Patient Instructions (Signed)

## 2016-06-22 DIAGNOSIS — D510 Vitamin B12 deficiency anemia due to intrinsic factor deficiency: Secondary | ICD-10-CM | POA: Diagnosis not present

## 2016-07-14 DIAGNOSIS — H401132 Primary open-angle glaucoma, bilateral, moderate stage: Secondary | ICD-10-CM | POA: Diagnosis not present

## 2016-07-14 DIAGNOSIS — E113591 Type 2 diabetes mellitus with proliferative diabetic retinopathy without macular edema, right eye: Secondary | ICD-10-CM | POA: Diagnosis not present

## 2016-07-14 DIAGNOSIS — H26491 Other secondary cataract, right eye: Secondary | ICD-10-CM | POA: Diagnosis not present

## 2016-07-14 DIAGNOSIS — H4311 Vitreous hemorrhage, right eye: Secondary | ICD-10-CM | POA: Diagnosis not present

## 2016-08-08 ENCOUNTER — Encounter: Payer: Self-pay | Admitting: Internal Medicine

## 2016-08-17 DIAGNOSIS — D510 Vitamin B12 deficiency anemia due to intrinsic factor deficiency: Secondary | ICD-10-CM | POA: Diagnosis not present

## 2016-09-01 DIAGNOSIS — G4733 Obstructive sleep apnea (adult) (pediatric): Secondary | ICD-10-CM | POA: Diagnosis not present

## 2016-09-01 DIAGNOSIS — E784 Other hyperlipidemia: Secondary | ICD-10-CM | POA: Diagnosis not present

## 2016-09-01 DIAGNOSIS — Z6830 Body mass index (BMI) 30.0-30.9, adult: Secondary | ICD-10-CM | POA: Diagnosis not present

## 2016-09-01 DIAGNOSIS — I2510 Atherosclerotic heart disease of native coronary artery without angina pectoris: Secondary | ICD-10-CM | POA: Diagnosis not present

## 2016-09-01 DIAGNOSIS — E1139 Type 2 diabetes mellitus with other diabetic ophthalmic complication: Secondary | ICD-10-CM | POA: Diagnosis not present

## 2016-09-01 DIAGNOSIS — I10 Essential (primary) hypertension: Secondary | ICD-10-CM | POA: Diagnosis not present

## 2016-09-06 ENCOUNTER — Ambulatory Visit: Payer: Medicare Other | Admitting: Podiatry

## 2016-09-08 DIAGNOSIS — D510 Vitamin B12 deficiency anemia due to intrinsic factor deficiency: Secondary | ICD-10-CM | POA: Diagnosis not present

## 2016-09-20 ENCOUNTER — Encounter: Payer: Self-pay | Admitting: Podiatry

## 2016-09-20 ENCOUNTER — Ambulatory Visit (INDEPENDENT_AMBULATORY_CARE_PROVIDER_SITE_OTHER): Payer: Medicare Other | Admitting: Podiatry

## 2016-09-20 DIAGNOSIS — E1142 Type 2 diabetes mellitus with diabetic polyneuropathy: Secondary | ICD-10-CM | POA: Diagnosis not present

## 2016-09-20 DIAGNOSIS — M79676 Pain in unspecified toe(s): Secondary | ICD-10-CM

## 2016-09-20 DIAGNOSIS — B351 Tinea unguium: Secondary | ICD-10-CM

## 2016-09-20 NOTE — Progress Notes (Signed)
  He presents today with chief complaint of painful elongated toenails.  Objective: Pulses are strong no lesions or wounds. Nails are thick yellow dystrophic onychomycotic.  Assessment: Pain and limb secondary to onychomycosis.  Plan: Debridement of toenails 1 through 5 bilateral.

## 2016-09-21 ENCOUNTER — Ambulatory Visit (AMBULATORY_SURGERY_CENTER): Payer: Self-pay | Admitting: *Deleted

## 2016-09-21 ENCOUNTER — Telehealth: Payer: Self-pay | Admitting: *Deleted

## 2016-09-21 VITALS — Ht 67.0 in | Wt 194.0 lb

## 2016-09-21 DIAGNOSIS — Z86010 Personal history of colonic polyps: Secondary | ICD-10-CM

## 2016-09-21 MED ORDER — NA SULFATE-K SULFATE-MG SULF 17.5-3.13-1.6 GM/177ML PO SOLN: 1.0000 | Freq: Once | ORAL | 0 refills | Status: AC

## 2016-09-21 NOTE — Telephone Encounter (Signed)
Patient is for recall colon on 10/06/16. He came in [facility] today explaining that he had a heart attack on 02/23/16 during his "stress test". He was sent home and the dr called him back to tell him that. He then went for ov and cardiac cath on 03/01/16. Patient denies any further heart concerns since then, he was put on Isosorbide. Please review, is patient ok for direct [facility] or needs OV or cardiac clearance? Thank you, pv

## 2016-09-21 NOTE — Progress Notes (Signed)
Patient denies any allergies to eggs or soy. Patient denies any problems with anesthesia/sedation. Patient denies any oxygen use at home and does not take any diet/weight loss medications. EMMI education declined by pt. Sent phone note to MD..Marland Kitchen

## 2016-09-22 NOTE — Telephone Encounter (Signed)
[place] for [place] from my perspective I have cc:'ed Nathan [name] with anesthesia to ensure from their side okay to proceed

## 2016-09-23 NOTE — Telephone Encounter (Signed)
[place] and Dr. Hilarie Fredrickson,  This pt is cleared for anesthetic care at [facility].  Thanks,  Osvaldo Angst

## 2016-09-27 ENCOUNTER — Telehealth: Payer: Self-pay | Admitting: Internal Medicine

## 2016-09-27 NOTE — Telephone Encounter (Signed)
Called pt and informed him he was cleared for his colon at [facility] per Dr Hilarie Fredrickson and Jenny Reichmann, our anethetist. Pt wants to make sure he is cleared by his Dr. Marlou Porch, his cardiologist. Informed pt I would try to contact Dr Marlou Porch at [phone] and let him know. The pt will call back in a couple days to check on the status or any changes.

## 2016-09-28 ENCOUNTER — Other Ambulatory Visit: Payer: Self-pay | Admitting: *Deleted

## 2016-09-29 NOTE — Telephone Encounter (Signed)
Pt has cancelled colonoscopy for now.  Will talk to his cardiologist in person in June 2018 and then call us back to reschedule. Angela/PV Pt is NOT on blood thinner

## 2016-09-30 NOTE — Telephone Encounter (Signed)
Spoke with spouse, Neoma Laming via telephone and informed her that patient is cleared for colonoscopy per cardiologist, Candee Furbish.  She will pass the message on to patient, in case he would like to go ahead and reschedule colonoscopy. Vale Peraza/PV

## 2016-09-30 NOTE — Telephone Encounter (Signed)
Levada Dy,  I am fine with him proceeding with colonoscopy. Only on ASA (as you state, no anticoagulation).  Candee Furbish, MD

## 2016-10-05 ENCOUNTER — Encounter: Payer: Medicare Other | Admitting: Internal Medicine

## 2016-10-06 ENCOUNTER — Encounter: Payer: Medicare Other | Admitting: Internal Medicine

## 2016-10-11 DIAGNOSIS — S40861A Insect bite (nonvenomous) of right upper arm, initial encounter: Secondary | ICD-10-CM | POA: Diagnosis not present

## 2016-10-11 DIAGNOSIS — Z6831 Body mass index (BMI) 31.0-31.9, adult: Secondary | ICD-10-CM | POA: Diagnosis not present

## 2016-10-11 DIAGNOSIS — D510 Vitamin B12 deficiency anemia due to intrinsic factor deficiency: Secondary | ICD-10-CM | POA: Diagnosis not present

## 2016-10-24 DIAGNOSIS — R002 Palpitations: Secondary | ICD-10-CM | POA: Diagnosis not present

## 2016-10-24 DIAGNOSIS — Z6831 Body mass index (BMI) 31.0-31.9, adult: Secondary | ICD-10-CM | POA: Diagnosis not present

## 2016-10-24 DIAGNOSIS — I2510 Atherosclerotic heart disease of native coronary artery without angina pectoris: Secondary | ICD-10-CM | POA: Diagnosis not present

## 2016-10-24 DIAGNOSIS — R0789 Other chest pain: Secondary | ICD-10-CM | POA: Diagnosis not present

## 2016-11-07 ENCOUNTER — Encounter: Payer: Self-pay | Admitting: Cardiology

## 2016-11-07 ENCOUNTER — Ambulatory Visit (INDEPENDENT_AMBULATORY_CARE_PROVIDER_SITE_OTHER): Payer: Medicare Other | Admitting: Cardiology

## 2016-11-07 VITALS — BP 130/60 | HR 71 | Ht 67.0 in | Wt 195.0 lb

## 2016-11-07 DIAGNOSIS — I25810 Atherosclerosis of coronary artery bypass graft(s) without angina pectoris: Secondary | ICD-10-CM | POA: Diagnosis not present

## 2016-11-07 DIAGNOSIS — I208 Other forms of angina pectoris: Secondary | ICD-10-CM | POA: Diagnosis not present

## 2016-11-07 DIAGNOSIS — Z952 Presence of prosthetic heart valve: Secondary | ICD-10-CM

## 2016-11-07 DIAGNOSIS — Z951 Presence of aortocoronary bypass graft: Secondary | ICD-10-CM | POA: Diagnosis not present

## 2016-11-07 NOTE — Patient Instructions (Signed)
Medication Instructions:  The current medical regimen is effective;  continue present plan and medications.  Testing/Procedures: Your physician has requested that you have an echocardiogram. Echocardiography is a painless test that uses sound waves to create images of your heart. It provides your doctor with information about the size and shape of your heart and how well your heart's chambers and valves are working. This procedure takes approximately one hour. There are no restrictions for this procedure.  Follow-Up: Follow up in 6 months with Dr. Skains.  You will receive a letter in the mail 2 months before you are due.  Please call us when you receive this letter to schedule your follow up appointment.  If you need a refill on your cardiac medications before your next appointment, please call your pharmacy.  Thank you for choosing [facility]!!       

## 2016-11-07 NOTE — Progress Notes (Signed)
Cardiology Office Note    Date:  11/07/2016   ID:  Nathan [name], DOB 19-Mar-1946, MRN [id number]  PCP:  Donnajean Lopes, MD  Cardiologist:   Candee Furbish, MD     History of Present Illness:  Nathan [name] is a 71 y.o. male former patient of Dr. Sherryl Barters with history of coronary artery bypass graft surgery and aortic valve replacement for severe aortic stenosis in April 2010. He has a tissue valve.  He underwent cardiac catheterization on 03/01/16 after experiencing angina-like symptoms and having a nuclear stress test with anterolateral defect. See below for details, SVG to RCA was the only occluded graft. Continued with medical management.  He feels more energy with the isosorbide. Continue.   His last echocardiogram on 06/13/14 showed normal aortic valve prosthetic function and normal left ventricular systolic function with ejection fraction of 55-60%. The patient has a history of high blood pressure, diabetes, and hypercholesterolemia. Also has a history of nightmares and night terrors.   The patient continues to work in a [employer]. 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 He is only working half day on most of the days. He intends to work for another several years until his wife retires from her work.   He is diabetic. He has not been having any hypoglycemic episodes. Generally he walks for exercise.  During the winter he gets less exercise.  His weight however has been stable.  He has not been having any chest pain or shortness of breath or palpitations.  He has a past history of PVCs but has not been aware of any recently.  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 He remembers the CDC coming to the hospital. Dr. Risa Grill helped save his life he states.He also has dreams, sometimes wakes up in a sweat. Has previously seen a sleep doctor. Could not tolerate CPAP mask. Does not like anything to touch him during  sleep.  11/07/16  - CP after the walk when got home. Lasting 1 hours. Moderate in severity. Doesn't remember having heart pain prior to CABG.  RMSF this year. Denies any syncope. He does have mild shortness breath with activity. He's had an occasional skipped beat, no syncope.   Past Medical History:  Diagnosis Date  . Aortic stenosis   . Arthritis   . Blood transfusion without reported diagnosis   . CAD (coronary artery disease)   . Diabetes mellitus, type 2 ([place])   . Diverticulosis of colon (without mention of hemorrhage)   . Duodenal ulcer perforation ([place])    blood transfusion  . Flesh-eating bacteria ([place]) 1995  . GERD (gastroesophageal reflux disease)   . Heart murmur   . History of diabetic retinopathy   . History of gastrointestinal bleeding   . History of necrotizing fasciitis   . History of prosthetic aortic valve   . Hypercholesteremia   . Myocardial infarction ([place]) 02/23/2016  . Nightmares    CHRONIC  . OSA (obstructive sleep apnea)    borderline no CPAP   . Personal history of colonic polyps 11/23/2009   TUBULAR ADENOMA  . Postoperative anemia   . Sinus bradycardia   . Sleep apnea    no cpap  . Stomach ulcer    Hx of  . Systolic hypertension   . Vitamin B12 deficiency     Past Surgical History:  Procedure Laterality Date  . AORTIC VALVE REPLACEMENT    . BLEPHAROPLASTY  11-24-14  . CARDIAC CATHETERIZATION N/A 03/01/2016  Procedure: Coronary/Graft Angiography;  Surgeon: Peter M Martinique, MD;  Location: [facility];  Service: Cardiovascular;  Laterality: N/A;  . CATARACT EXTRACTION W/ INTRAOCULAR LENS IMPLANT     OD  . CORONARY ARTERY BYPASS GRAFT  2010  . flesh eating disease surgery      surgeries x 7   . [place]  . RETINAL LASER PROCEDURE    . [place]   resection  . UMBILICAL HERNIA REPAIR N/A 04/10/2013   Procedure: HERNIA REPAIR UMBILICAL ADULT with mesh;  Surgeon: Pedro Earls, MD;  Location: [facility];  Service:  General;  Laterality: N/A;    Current Medications: Outpatient Medications Prior to Visit  Medication Sig Dispense Refill  . allopurinol (ZYLOPRIM) 300 MG tablet Take 300 mg by mouth daily.    Marland Kitchen ALPRAZolam (XANAX) 0.25 MG tablet Take 0.25 mg by mouth daily as needed for anxiety.     Marland Kitchen amLODipine (NORVASC) 5 MG tablet Take 5 mg by mouth every morning.     . Ascorbic Acid (VITAMIN C) 1000 MG tablet Take 1,000 mg by mouth daily.    Marland Kitchen aspirin 81 MG tablet Take 1 tablet (81 mg total) by mouth daily.    Marland Kitchen atorvastatin (LIPITOR) 20 MG tablet Take 20 mg by mouth daily.  3  . BAYER CONTOUR TEST test strip USE 1 STRIP THREE TIMES DAILY TO CHECK BLOOD SUGARS.  11  . Cholecalciferol (VITAMIN D3) 1000 UNITS capsule Take 10,000 Units by mouth daily.     Marland Kitchen co-enzyme Q-10 30 MG capsule Take 200 mg by mouth 2 (two) times daily.     . Cyanocobalamin (VITAMIN B-12 IJ) Inject 1,000 mcg into the muscle every 30 (thirty) days. Amt/dose is unknown    . cyclobenzaprine (FLEXERIL) 10 MG tablet TAKE 1 TABLET (10 mg) BY MOUTH DAILY AS NEEDED FOR MUSCLE CRAMPS  3  . cycloSPORINE (RESTASIS) 0.05 % ophthalmic emulsion Place 1 drop into both eyes 2 (two) times daily.    . fish oil-omega-3 fatty acids 1000 MG capsule Take 2 g by mouth daily.     Marland Kitchen glipiZIDE (GLUCOTROL) 5 MG tablet Take 5 mg by mouth every morning.     . hydrochlorothiazide (MICROZIDE) 12.5 MG capsule Take 12.5 mg by mouth daily. Reported on 08/20/2015    . HYDROcodone-acetaminophen (VICODIN) 5-500 MG per tablet Take 1 tablet by mouth daily as needed for pain.     . isosorbide mononitrate (IMDUR) 30 MG 24 hr tablet Take 30 mg by mouth daily.  11  . ISOSORBIDE PO Take 1 tablet by mouth daily.    Marland Kitchen latanoprost (XALATAN) 0.005 % ophthalmic solution Place 1 drop into both eyes at bedtime.      Marland Kitchen LEVEMIR FLEXTOUCH 100 UNIT/ML Pen Inject 18 Units into the skin daily.  12  . losartan (COZAAR) 50 MG tablet Take 1 tablet (50 mg total) by mouth daily. 90 tablet 1  .  metFORMIN (GLUCOPHAGE) 1000 MG tablet Take 1,000 mg by mouth daily with breakfast.    . metoprolol (LOPRESSOR) 50 MG tablet Take 50 mg by mouth 2 (two) times daily.     . minoxidil (LONITEN) 10 MG tablet Take 10 mg by mouth daily.   5  . Multiple Vitamin (MULTIVITAMIN) tablet Take 1 tablet by mouth daily.      . nitroGLYCERIN (NITROSTAT) 0.4 MG SL tablet Place 1 tablet under the tongue as needed.  12  . omeprazole (PRILOSEC) 40 MG capsule Take  40 mg by mouth 2 (two) times daily.  2  . oxyCODONE-acetaminophen (PERCOCET/ROXICET) 5-325 MG tablet Take 1 tablet by mouth daily as needed for moderate pain or severe pain.     Marland Kitchen oxymetazoline (AFRIN) 0.05 % nasal spray Place 2 sprays into the nose 2 (two) times daily as needed for congestion. Reported on 08/20/2015    . doxycycline (VIBRAMYCIN) 100 MG capsule Take 100 mg by mouth 2 (two) times daily. For 7 days for dentist appointments     No facility-administered medications prior to visit.      Allergies:   Shellfish-derived products and Zolpidem tartrate   Social History   Social History  . Marital status: Married    Spouse name: N/A  . Number of children: 0  . Years of education: N/A   Occupational History  . SALES Pharmacist, community   Social History Main Topics  . Smoking status: Never Smoker  . Smokeless tobacco: Never Used  . Alcohol use No  . Drug use: No  . Sexual activity: Not Asked   Other Topics Concern  . None   Social History Narrative  . None     Family History:  The patient's family history includes Alcohol abuse in his father; Cirrhosis in his father; Colon cancer (age of onset: 26) in his mother; Diabetes in his mother and sister; Hypertension in his mother and sister; Rectal cancer in his mother.   ROS:   Please see the history of present illness.    ROS All other systems reviewed and are negative.   PHYSICAL EXAM:   VS:  BP 130/60   Pulse 71   Ht 5\' 7"  (1.702 m)   Wt 195 lb (88.5 kg)   SpO2 97%   BMI  30.54 kg/m    GEN: Well nourished, well developed, in no acute distress  HEENT: normal  Neck: no JVD, carotid bruits, or masses Cardiac: RRR; 2/6 systolic murmurs, rubs, or gallops,no edema  Respiratory:  clear to auscultation bilaterally, normal work of breathing GI: soft, nontender, nondistended, + BS, obese MS: no deformity or atrophy  Skin: warm and dry, no rash Neuro:  Alert and Oriented x 3, Strength and sensation are intact Psych: euthymic mood, full affect   Wt Readings from Last 3 Encounters:  11/07/16 195 lb (88.5 kg)  09/21/16 194 lb (88 kg)  06/16/16 192 lb 9.6 oz (87.4 kg)      Studies/Labs Reviewed:   EKG:  EKG is ordered today.  11/07/16-sinus rhythm septal infarct pattern, nonspecific T-wave changes, no other abnormalities, poor R-wave progression noted. The ekg ordered today demonstrates 02/09/16-sinus rhythm, 63, no other abnormalities.  ECHO 06/13/14:  - Left ventricle: The cavity size was normal. Systolic function was normal. The estimated ejection fraction was in the range of 55% to 60%. Wall motion was normal; there were no regional wall motion abnormalities. - Aortic valve: A bioprosthesis was present and functioning normally. - Right ventricle: The cavity size was mildly dilated. Wall thickness was normal. - Atrial septum: There was increased thickness of the septum, consistent with lipomatous hypertrophy.  Nuclear stress test 2011  - Normal  Cardiac cath 02/23/16:  CORONARY ANGIOGRAPHY:  Left main coronary artery appears normal.   Left anterior descending artery was diffusely heavily calcified.  There  is 80-90% stenosis in the proximal vessel and in the midvessel.  There  are 3 very small diagonal branches.  They have mild atherosclerosis.   The left circumflex coronary artery gives rise to  2 very small marginal  vessels in the proximal mid vessel.  There is a moderate-sized marginal  vessel that is occluded proximally and fills by  left-to-left  collaterals.  The both marginal vessel and terminal marginal vessels are  large vessel.  There is an 80% stenosis in the mid circumflex followed  by 90% stenosis before the large terminal branch.  Again, the third  obtuse marginal vessel is occluded.   The right coronary rises and distributes normally.  It is also  moderately calcified.  There is 50-60% disease in the mid right coronary  artery and diffuse 30% disease distally.   FINAL INTERPRETATION:  1. Severe three-vessel obstructive coronary artery disease.  2. Normal left ventricular function.  3. Moderate aortic stenosis.  4. Normal right heart pressures.  CABG op report 10/20/08: Coronary artery bypass grafting x3 with the left  internal mammary to the left anterior descending coronary artery, reverse saphenous vein graft to circumflex coronary artery, reverse saphenous vein graft to the posterior descending coronary artery and aortic valve replacement with a pericardial tissue valve.  [employer], [name], 23-mm, serial number [id number], with bilateral thigh endovein harvesting.  SURGEON:  Lanelle Bal, MD   Recent Labs: 02/25/2016: BUN 17; Creat 1.14; Hemoglobin 11.4; Platelets 196; Potassium 4.3; Sodium 141   Lipid Panel No results found for: CHOL, TRIG, HDL, CHOLHDL, VLDL, LDLCALC, LDLDIRECT  Additional studies/ records that were reviewed today include:  Prior office notes reviewed  NUC stress 02/23/16:  Nuclear stress EF: 58%. Apical hypokinesis.  There was no ST segment deviation noted during stress.  Defect 1: There is a medium defect of moderate severity present in the mid anterior, mid anteroseptal and mid anterolateral location. This defect is partially fixed however does demonstrate some reversibility along the anterior/anterolateral distribution.  Findings consistent with prior myocardial infarction with peri-infarct ischemia.  This is an intermediate risk study.  Cath  03/01/16:  Mid RCA lesion, 50 %stenosed.  Mid RCA to Dist RCA lesion, 100 %stenosed.  Prox Cx to Mid Cx lesion, 99 %stenosed.  Prox LAD to Mid LAD lesion, 100 %stenosed.  SVG to RCA is occluded  Origin lesion, 100 %stenosed.  SVG to OM2 is widely patent.  LIMA and is normal in caliber and anatomically normal.   1. Severe 3 vessel occlusive CAD 2. Patent LIMA to the LAD 3. Patent SVG to large OM2 4. Occluded SVG to PDA. Distal RCA is occluded and fills by left to right collaterals.   Plan: recommend medical management.   ASSESSMENT:    1. Coronary artery disease involving autologous vein coronary bypass graft without angina pectoris   2. Hx of CABG   3. S/P AVR (aortic valve replacement)   4. Angina decubitus ([place])      PLAN:  In order of problems listed above:  Chest pain/short of breath/Angina  - Reassuring cardiac catheterization-medical management 03/01/16  -  He does not recall any particular symptoms prior to his bypass surgery/aortic valve replacement. NUC stress was intermediate risk with defect in the anterior anterolateral distribution that was partially reversible.   - isosorbide 30 mg once a day. Long acting nitrate. Continue with metoprolol.  - If necessary, he can take a nitroglycerin. Watch for worsening anginal symptoms. Conditioning will help this. Could always consider Ranexa in the future.  Coronary artery disease status post bypass surgery 2011  - decrease aspirin 81 mg  - Continue with metoprolol, atorvastatin 20, losartan  - Prior cardiac catheterization reviewed as above.  Aortic valve replacement  - We will check an echocardiogram to interpret proper structure and function. Murmur appreciated.  Palpitations  - By description likely PVCs or PACs. His EKG today shows no evidence of atrial fibrillation. If necessary, we can always place an event monitor in the future.  History of GI bleed-remember in 2010 he had a gastric ulcer with  hemorrhage.  Hyperlipidemia  - Continuing with statin therapy  Obesity  -Encourage weight loss, glad he is out walking, or took his first walk. Continue to encourage conditioning.  Diabetes with hyperlipidemia  - Medications reviewed, Dr. Sharlett Iles has been monitoring, no changes made  Obstructive sleep apnea-diagnosed in 2009 intolerant of CPAP.  Medication Adjustments/Labs and Tests Ordered: Current medicines are reviewed at length with the patient today.  Concerns regarding medicines are outlined above.  Medication changes, Labs and Tests ordered today are listed in the Patient Instructions below. Patient Instructions  Medication Instructions:  The current medical regimen is effective;  continue present plan and medications.  Testing/Procedures: Your physician has requested that you have an echocardiogram. Echocardiography is a painless test that uses sound waves to create images of your heart. It provides your doctor with information about the size and shape of your heart and how well your heart's chambers and valves are working. This procedure takes approximately one hour. There are no restrictions for this procedure.  Follow-Up: Follow up in 6 months with Dr. Marlou Porch.  You will receive a letter in the mail 2 months before you are due.  Please call us when you receive this letter to schedule your follow up appointment.  If you need a refill on your cardiac medications before your next appointment, please call your pharmacy.  Thank you for choosing [facility]!!        Signed, Candee Furbish, MD  11/07/2016 2:23 PM    [address], [place], [place]  [address] Phone: [phone]; Fax: [phone]

## 2016-11-08 ENCOUNTER — Ambulatory Visit: Payer: Medicare Other | Admitting: Cardiology

## 2016-11-09 ENCOUNTER — Ambulatory Visit (HOSPITAL_COMMUNITY): Payer: Medicare Other | Attending: Interventional Cardiology

## 2016-11-09 ENCOUNTER — Other Ambulatory Visit: Payer: Self-pay

## 2016-11-09 DIAGNOSIS — Z952 Presence of prosthetic heart valve: Secondary | ICD-10-CM | POA: Diagnosis not present

## 2016-11-09 DIAGNOSIS — I25810 Atherosclerosis of coronary artery bypass graft(s) without angina pectoris: Secondary | ICD-10-CM | POA: Insufficient documentation

## 2016-11-09 DIAGNOSIS — I77819 Aortic ectasia, unspecified site: Secondary | ICD-10-CM | POA: Diagnosis not present

## 2016-11-14 ENCOUNTER — Telehealth: Payer: Self-pay | Admitting: *Deleted

## 2016-11-14 NOTE — Telephone Encounter (Signed)
-----   Message from Jerline Pain, MD sent at 11/13/2016  6:35 AM EDT ----- Normal EF. Bioprosthetic AVR is functioning normally.  Candee Furbish, MD

## 2016-11-14 NOTE — Telephone Encounter (Signed)
DPR ok to s/w pt's wife Barrett Shell. Pt's wife has been notified of echo results by phone with verbal understanding to results given, bioprosthetic AVR function is normal as well as EF is normal. Pt's wife thanked me for the good news and the call today. I stated if pt does have any questions to feel free to call back and s/w Dr. Marlou Porch nurse Kelli Churn.

## 2016-11-16 DIAGNOSIS — D510 Vitamin B12 deficiency anemia due to intrinsic factor deficiency: Secondary | ICD-10-CM | POA: Diagnosis not present

## 2016-11-24 DIAGNOSIS — E113592 Type 2 diabetes mellitus with proliferative diabetic retinopathy without macular edema, left eye: Secondary | ICD-10-CM | POA: Diagnosis not present

## 2016-11-24 DIAGNOSIS — E113591 Type 2 diabetes mellitus with proliferative diabetic retinopathy without macular edema, right eye: Secondary | ICD-10-CM | POA: Diagnosis not present

## 2016-11-24 DIAGNOSIS — H401132 Primary open-angle glaucoma, bilateral, moderate stage: Secondary | ICD-10-CM | POA: Diagnosis not present

## 2016-12-06 DIAGNOSIS — Z1389 Encounter for screening for other disorder: Secondary | ICD-10-CM | POA: Diagnosis not present

## 2016-12-06 DIAGNOSIS — I10 Essential (primary) hypertension: Secondary | ICD-10-CM | POA: Diagnosis not present

## 2016-12-06 DIAGNOSIS — Z6831 Body mass index (BMI) 31.0-31.9, adult: Secondary | ICD-10-CM | POA: Diagnosis not present

## 2016-12-06 DIAGNOSIS — G4733 Obstructive sleep apnea (adult) (pediatric): Secondary | ICD-10-CM | POA: Diagnosis not present

## 2016-12-06 DIAGNOSIS — I2510 Atherosclerotic heart disease of native coronary artery without angina pectoris: Secondary | ICD-10-CM | POA: Diagnosis not present

## 2016-12-06 DIAGNOSIS — E1139 Type 2 diabetes mellitus with other diabetic ophthalmic complication: Secondary | ICD-10-CM | POA: Diagnosis not present

## 2016-12-14 ENCOUNTER — Ambulatory Visit (INDEPENDENT_AMBULATORY_CARE_PROVIDER_SITE_OTHER): Payer: Medicare Other | Admitting: Cardiology

## 2016-12-14 ENCOUNTER — Encounter: Payer: Self-pay | Admitting: Cardiology

## 2016-12-14 VITALS — BP 124/62 | HR 82 | Ht 67.0 in | Wt 191.8 lb

## 2016-12-14 DIAGNOSIS — Z952 Presence of prosthetic heart valve: Secondary | ICD-10-CM | POA: Diagnosis not present

## 2016-12-14 DIAGNOSIS — I25810 Atherosclerosis of coronary artery bypass graft(s) without angina pectoris: Secondary | ICD-10-CM

## 2016-12-14 DIAGNOSIS — I208 Other forms of angina pectoris: Secondary | ICD-10-CM

## 2016-12-14 DIAGNOSIS — Z951 Presence of aortocoronary bypass graft: Secondary | ICD-10-CM | POA: Diagnosis not present

## 2016-12-14 NOTE — Progress Notes (Signed)
Cardiology Office Note    Date:  12/14/2016   ID:  Nathan [name], DOB December 20, 1945, MRN [id number]  PCP:  Leanna Battles, MD  Cardiologist:   Candee Furbish, MD     History of Present Illness:  Nathan [name] is a 71 y.o. male former patient of Dr. Sherryl Barters with history of coronary artery bypass graft surgery and aortic valve replacement for severe aortic stenosis in April 2010. He has a tissue valve.  He underwent cardiac catheterization on 03/01/16 after experiencing angina-like symptoms and having a nuclear stress test with anterolateral defect. See below for details, SVG to RCA was the only occluded graft. Continued with medical management.  He feels more energy with the isosorbide. Continue.  The patient has a history of high blood pressure, diabetes, and hypercholesterolemia. Also has a history of nightmares and night terrors.   The patient continues to work in a [employer]. 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 He is only working half day on most of the days. He intends to work for another several years until his wife retires from her work.   He is diabetic.Generally he walks for exercise.  During the winter he gets less exercise.  His weight however has been stable.  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 He remembers the [facility] coming to the hospital. Dr. Risa Grill helped save his life he states.He also has dreams, sometimes wakes up in a sweat. Has previously seen a sleep doctor. Could not tolerate CPAP mask. Does not like anything to touch him during sleep.  11/07/16  - CP after the walk when got home. Lasting 1 hours. Moderate in severity. Doesn't remember having heart pain prior to CABG.  RMSF this year. Denies any syncope. He does have mild shortness breath with activity. He's had an occasional skipped beat, no syncope.  12/14/16-overall he is feeling much better. No chest pain, no significant shortness of breath.  He is not feeling any significant palpitations. He told me about the time he was stationed in [place] 1968  Past Medical History:  Diagnosis Date  . Aortic stenosis   . Arthritis   . Blood transfusion without reported diagnosis   . CAD (coronary artery disease)   . Diabetes mellitus, type 2 ([place])   . Diverticulosis of colon (without mention of hemorrhage)   . Duodenal ulcer perforation ([place])    blood transfusion  . Flesh-eating bacteria ([place]) 1995  . GERD (gastroesophageal reflux disease)   . Heart murmur   . History of diabetic retinopathy   . History of gastrointestinal bleeding   . History of necrotizing fasciitis   . History of prosthetic aortic valve   . Hypercholesteremia   . Myocardial infarction ([place]) 02/23/2016  . Nightmares    CHRONIC  . OSA (obstructive sleep apnea)    borderline no CPAP   . Personal history of colonic polyps 11/23/2009   TUBULAR ADENOMA  . Postoperative anemia   . Sinus bradycardia   . Sleep apnea    no cpap  . Stomach ulcer    Hx of  . Systolic hypertension   . Vitamin B12 deficiency     Past Surgical History:  Procedure Laterality Date  . AORTIC VALVE REPLACEMENT    . BLEPHAROPLASTY  11-24-14  . CARDIAC CATHETERIZATION N/A 03/01/2016   Procedure: Coronary/Graft Angiography;  Surgeon: Peter M Martinique, MD;  Location: [facility];  Service: Cardiovascular;  Laterality: N/A;  . CATARACT EXTRACTION W/ INTRAOCULAR LENS IMPLANT  OD  . CORONARY ARTERY BYPASS GRAFT  2010  . flesh eating disease surgery      surgeries x 7   . [place]  . RETINAL LASER PROCEDURE    . [place]   resection  . UMBILICAL HERNIA REPAIR N/A 04/10/2013   Procedure: HERNIA REPAIR UMBILICAL ADULT with mesh;  Surgeon: Pedro Earls, MD;  Location: [facility];  Service: General;  Laterality: N/A;    Current Medications: Outpatient Medications Prior to Visit  Medication Sig Dispense Refill  . allopurinol (ZYLOPRIM) 300 MG tablet Take 300  mg by mouth daily.    Marland Kitchen ALPRAZolam (XANAX) 0.25 MG tablet Take 0.25 mg by mouth daily as needed for anxiety.     Marland Kitchen amLODipine (NORVASC) 5 MG tablet Take 5 mg by mouth every morning.     . Ascorbic Acid (VITAMIN C) 1000 MG tablet Take 1,000 mg by mouth daily.    Marland Kitchen aspirin 81 MG tablet Take 1 tablet (81 mg total) by mouth daily.    Marland Kitchen atorvastatin (LIPITOR) 20 MG tablet Take 20 mg by mouth daily.  3  . BAYER CONTOUR TEST test strip USE 1 STRIP THREE TIMES DAILY TO CHECK BLOOD SUGARS.  11  . Cholecalciferol (VITAMIN D3) 1000 UNITS capsule Take 10,000 Units by mouth daily.     Marland Kitchen co-enzyme Q-10 30 MG capsule Take 200 mg by mouth 2 (two) times daily.     . Cyanocobalamin (VITAMIN B-12 IJ) Inject 1,000 mcg into the muscle every 30 (thirty) days. Amt/dose is unknown    . cyclobenzaprine (FLEXERIL) 10 MG tablet TAKE 1 TABLET (10 mg) BY MOUTH DAILY AS NEEDED FOR MUSCLE CRAMPS  3  . cycloSPORINE (RESTASIS) 0.05 % ophthalmic emulsion Place 1 drop into both eyes 2 (two) times daily.    . fish oil-omega-3 fatty acids 1000 MG capsule Take 2 g by mouth daily.     Marland Kitchen glipiZIDE (GLUCOTROL) 5 MG tablet Take 5 mg by mouth every morning.     . hydrochlorothiazide (MICROZIDE) 12.5 MG capsule Take 12.5 mg by mouth daily. Reported on 08/20/2015    . HYDROcodone-acetaminophen (VICODIN) 5-500 MG per tablet Take 1 tablet by mouth daily as needed for pain.     . isosorbide mononitrate (IMDUR) 30 MG 24 hr tablet Take 30 mg by mouth daily.  11  . ISOSORBIDE PO Take 1 tablet by mouth daily.    Marland Kitchen latanoprost (XALATAN) 0.005 % ophthalmic solution Place 1 drop into both eyes at bedtime.      Marland Kitchen LEVEMIR FLEXTOUCH 100 UNIT/ML Pen Inject 18 Units into the skin daily.  12  . losartan (COZAAR) 50 MG tablet Take 1 tablet (50 mg total) by mouth daily. 90 tablet 1  . metFORMIN (GLUCOPHAGE) 1000 MG tablet Take 1,000 mg by mouth daily with breakfast.    . metoprolol (LOPRESSOR) 50 MG tablet Take 50 mg by mouth 2 (two) times daily.     .  minoxidil (LONITEN) 10 MG tablet Take 10 mg by mouth daily.   5  . Multiple Vitamin (MULTIVITAMIN) tablet Take 1 tablet by mouth daily.      . nitroGLYCERIN (NITROSTAT) 0.4 MG SL tablet Place 1 tablet under the tongue as needed.  12  . omeprazole (PRILOSEC) 40 MG capsule Take 40 mg by mouth 2 (two) times daily.  2  . oxyCODONE-acetaminophen (PERCOCET/ROXICET) 5-325 MG tablet Take 1 tablet by mouth daily as needed for moderate pain or severe pain.     Marland Kitchen  oxymetazoline (AFRIN) 0.05 % nasal spray Place 2 sprays into the nose 2 (two) times daily as needed for congestion. Reported on 08/20/2015     No facility-administered medications prior to visit.      Allergies:   Shellfish-derived products and Zolpidem tartrate   Social History   Social History  . Marital status: Married    Spouse name: N/A  . Number of children: 0  . Years of education: N/A   Occupational History  . SALES Pharmacist, community   Social History Main Topics  . Smoking status: Never Smoker  . Smokeless tobacco: Never Used  . Alcohol use No  . Drug use: No  . Sexual activity: Not Asked   Other Topics Concern  . None   Social History Narrative  . None     Family History:  The patient's family history includes Alcohol abuse in his father; Cirrhosis in his father; Colon cancer (age of onset: 85) in his mother; Diabetes in his mother and sister; Hypertension in his mother and sister; Rectal cancer in his mother.   ROS:   Unless stated above, all other review of systems negative   ROS    PHYSICAL EXAM:   VS:  BP 124/62   Pulse 82   Ht 5\' 7"  (1.702 m)   Wt 191 lb 12.8 oz (87 kg)   BMI 30.04 kg/m    GEN: Well nourished, well developed, in no acute distress  HEENT: normal  Neck: no JVD, carotid bruits, or masses Cardiac: RRR; 2/6 SM, no rubs, or gallops,no edema  Respiratory:  clear to auscultation bilaterally, normal work of breathing GI: soft, nontender, nondistended, + BS, obese MS: no deformity or atrophy    Skin: warm and dry, no rash Neuro:  Alert and Oriented x 3, Strength and sensation are intact Psych: euthymic mood, full affect    Wt Readings from Last 3 Encounters:  12/14/16 191 lb 12.8 oz (87 kg)  Nov 29, 2016 195 lb (88.5 kg)  09/21/16 194 lb (88 kg)      Studies/Labs Reviewed:   EKG:    2016/11/29-sinus rhythm septal infarct pattern, nonspecific T-wave changes, no other abnormalities, poor R-wave progression noted. The ekg ordered today demonstrates 02/09/16-sinus rhythm, 63, no other abnormalities.  ECHO 06/13/14:  - Left ventricle: The cavity size was normal. Systolic function was normal. The estimated ejection fraction was in the range of 55% to 60%. Wall motion was normal; there were no regional wall motion abnormalities. - Aortic valve: A bioprosthesis was present and functioning normally. - Right ventricle: The cavity size was mildly dilated. Wall thickness was normal. - Atrial septum: There was increased thickness of the septum, consistent with lipomatous hypertrophy.  Nuclear stress test 2011  - Normal  Cardiac cath 02/23/16:  CORONARY ANGIOGRAPHY:  Left main coronary artery appears normal.   Left anterior descending artery was diffusely heavily calcified.  There  is 80-90% stenosis in the proximal vessel and in the midvessel.  There  are 3 very small diagonal branches.  They have mild atherosclerosis.   The left circumflex coronary artery gives rise to 2 very small marginal  vessels in the proximal mid vessel.  There is a moderate-sized marginal  vessel that is occluded proximally and fills by left-to-left  collaterals.  The both marginal vessel and terminal marginal vessels are  large vessel.  There is an 80% stenosis in the mid circumflex followed  by 90% stenosis before the large terminal branch.  Again, the third  obtuse  marginal vessel is occluded.   The right coronary rises and distributes normally.  It is also  moderately calcified.  There  is 50-60% disease in the mid right coronary  artery and diffuse 30% disease distally.   FINAL INTERPRETATION:  1. Severe three-vessel obstructive coronary artery disease.  2. Normal left ventricular function.  3. Moderate aortic stenosis.  4. Normal right heart pressures.  CABG op report 10/20/08: Coronary artery bypass grafting x3 with the left  internal mammary to the left anterior descending coronary artery, reverse saphenous vein graft to circumflex coronary artery, reverse saphenous vein graft to the posterior descending coronary artery and aortic valve replacement with a pericardial tissue valve.  [employer], [name], 23-mm, serial number [id number], with bilateral thigh endovein harvesting.  SURGEON:  Lanelle Bal, MD   Recent Labs: 02/25/2016: BUN 17; Creat 1.14; Hemoglobin 11.4; Platelets 196; Potassium 4.3; Sodium 141   Lipid Panel No results found for: CHOL, TRIG, HDL, CHOLHDL, VLDL, LDLCALC, LDLDIRECT  Additional studies/ records that were reviewed today include:  Prior office notes reviewed  NUC stress 02/23/16:  Nuclear stress EF: 58%. Apical hypokinesis.  There was no ST segment deviation noted during stress.  Defect 1: There is a medium defect of moderate severity present in the mid anterior, mid anteroseptal and mid anterolateral location. This defect is partially fixed however does demonstrate some reversibility along the anterior/anterolateral distribution.  Findings consistent with prior myocardial infarction with peri-infarct ischemia.  This is an intermediate risk study.  Cath 03/01/16:  Mid RCA lesion, 50 %stenosed.  Mid RCA to Dist RCA lesion, 100 %stenosed.  Prox Cx to Mid Cx lesion, 99 %stenosed.  Prox LAD to Mid LAD lesion, 100 %stenosed.  SVG to RCA is occluded  Origin lesion, 100 %stenosed.  SVG to OM2 is widely patent.  LIMA and is normal in caliber and anatomically normal.   1. Severe 3 vessel occlusive CAD 2. Patent  LIMA to the LAD 3. Patent SVG to large OM2 4. Occluded SVG to PDA. Distal RCA is occluded and fills by left to right collaterals.   Plan: recommend medical management.   ASSESSMENT:    1. Coronary artery disease involving autologous vein coronary bypass graft without angina pectoris   2. Hx of CABG   3. S/P AVR (aortic valve replacement)      PLAN:  In order of problems listed above:  Chest pain/short of breath/Angina  - Reassuring cardiac catheterization 03/01/16-medical management  -  He does not recall any particular symptoms prior to his bypass surgery/aortic valve replacement. NUC stress was intermediate risk with defect in the anterior anterolateral distribution that was partially reversible.   - isosorbide 30 mg once a day. Long acting nitrate. Continue with metoprolol. This seems to be helping quite a bit. We will not make any changes today.  - If necessary, he can take a nitroglycerin. Watch for worsening anginal symptoms. Conditioning will help this. Could always consider Ranexa in the future.  Coronary artery disease status post bypass surgery 2011  - decrease aspirin 81 mg  - Continue with metoprolol, atorvastatin 20, losartan  - Prior cardiac catheterization reviewed as above. Stable.  Aortic valve replacement  - 11/09/16 - ECHO - Normal EF. Bioprosthetic AVR is functioning normally.   Palpitations  - By description likely PVCs or PACs. His EKG today shows no evidence of atrial fibrillation. If necessary, we can always place an event monitor in the future. No longer feeling them.   History of GI  bleed-remember in 2010 he had a gastric ulcer with hemorrhage.  Hyperlipidemia  - Continuing with statin therapy  Obesity  -Encourage weight loss, glad he is out walking, or took his first walk. Continue to encourage conditioning.  Diabetes with hyperlipidemia  - Medications reviewed, Dr. Sharlett Iles has been monitoring, no changes made  Obstructive sleep apnea-diagnosed  in 2009 intolerant of CPAP.  Medication Adjustments/Labs and Tests Ordered: Current medicines are reviewed at length with the patient today.  Concerns regarding medicines are outlined above.  Medication changes, Labs and Tests ordered today are listed in the Patient Instructions below. Patient Instructions  Medication Instructions:  The current medical regimen is effective;  continue present plan and medications.  Follow-Up: Follow up in 6 months with Dr. Marlou Porch.  You will receive a letter in the mail 2 months before you are due.  Please call us when you receive this letter to schedule your follow up appointment.  If you need a refill on your cardiac medications before your next appointment, please call your pharmacy.  Thank you for choosing [facility]!!        Signed, Candee Furbish, MD  12/14/2016 2:22 PM    [facility] [address], [place], [place]  [address] Phone: [phone]; Fax: [phone]

## 2016-12-14 NOTE — Patient Instructions (Signed)

## 2016-12-21 DIAGNOSIS — D510 Vitamin B12 deficiency anemia due to intrinsic factor deficiency: Secondary | ICD-10-CM | POA: Diagnosis not present

## 2016-12-22 ENCOUNTER — Encounter: Payer: Self-pay | Admitting: Podiatry

## 2016-12-22 ENCOUNTER — Ambulatory Visit (INDEPENDENT_AMBULATORY_CARE_PROVIDER_SITE_OTHER): Payer: Medicare Other | Admitting: Podiatry

## 2016-12-22 DIAGNOSIS — M79676 Pain in unspecified toe(s): Secondary | ICD-10-CM

## 2016-12-22 DIAGNOSIS — E1142 Type 2 diabetes mellitus with diabetic polyneuropathy: Secondary | ICD-10-CM

## 2016-12-22 DIAGNOSIS — B351 Tinea unguium: Secondary | ICD-10-CM

## 2016-12-25 NOTE — Progress Notes (Signed)
He presents today chief complaint of painful neuropathy with associated painful toenails.  Objective: Vital signs are stable he is alert and oriented 3. Pulses are palpable. Pain in limb secondary to onychomycosis and diabetic peripheral neuropathy.  Assessment: Patient lives in a onychomycosis and peripheral neuropathy.  Plan: Debridement of all reactive hyperkeratotic tissue debridement toenails.

## 2017-01-18 DIAGNOSIS — D510 Vitamin B12 deficiency anemia due to intrinsic factor deficiency: Secondary | ICD-10-CM | POA: Diagnosis not present

## 2017-02-13 ENCOUNTER — Other Ambulatory Visit: Payer: Self-pay | Admitting: Cardiology

## 2017-03-01 DIAGNOSIS — D510 Vitamin B12 deficiency anemia due to intrinsic factor deficiency: Secondary | ICD-10-CM | POA: Diagnosis not present

## 2017-03-02 ENCOUNTER — Telehealth: Payer: Self-pay | Admitting: Internal Medicine

## 2017-03-02 NOTE — Telephone Encounter (Signed)
Pt states he had his colon scheduled in March and had to postpone it due to some cardiac issues. Pt is requesting to reschedule his colon. Can he be a direct colon or do you want an OV with him 1st? Please advise.

## 2017-03-02 NOTE — Telephone Encounter (Signed)
[place] for [facility] colonoscopy without OV

## 2017-03-02 NOTE — Telephone Encounter (Signed)
Please schedule direct colon in the lec per Dr. Hilarie Fredrickson.

## 2017-03-03 ENCOUNTER — Encounter: Payer: Self-pay | Admitting: Internal Medicine

## 2017-03-03 NOTE — Telephone Encounter (Signed)
Left message for patient to call back and schedule appointment.

## 2017-03-23 ENCOUNTER — Encounter: Payer: Self-pay | Admitting: Podiatry

## 2017-03-23 ENCOUNTER — Ambulatory Visit (INDEPENDENT_AMBULATORY_CARE_PROVIDER_SITE_OTHER): Payer: Medicare Other | Admitting: Podiatry

## 2017-03-23 DIAGNOSIS — M79676 Pain in unspecified toe(s): Secondary | ICD-10-CM | POA: Diagnosis not present

## 2017-03-23 DIAGNOSIS — E1142 Type 2 diabetes mellitus with diabetic polyneuropathy: Secondary | ICD-10-CM

## 2017-03-23 DIAGNOSIS — B351 Tinea unguium: Secondary | ICD-10-CM

## 2017-03-23 NOTE — Progress Notes (Signed)
He presents today with a history of diabetic polyneuropathy associated with diabetes. He is complaining of painful elongated toenails 1 through 5 bilateral. He denies any trauma or open wounds.  Objective: Vital signs are stable alert and oriented 3 pulses are palpable diminished sensation per Semmes-Weinstein monofilament. Toenails elongated yellow dystrophic onychomycotic severe hammertoe deformities on her flexed metatarsals. Loss of digital hair growth  Assessment: Diabetic peripheral neuropathy with hammertoe deformities bilateral.  Plan: Debridement of toenails 1 through 5 bilateral covered service due to diabetes neuropathy.

## 2017-03-25 DIAGNOSIS — Z23 Encounter for immunization: Secondary | ICD-10-CM | POA: Diagnosis not present

## 2017-04-20 ENCOUNTER — Ambulatory Visit (AMBULATORY_SURGERY_CENTER): Payer: Self-pay | Admitting: *Deleted

## 2017-04-20 VITALS — Ht 66.5 in | Wt 197.2 lb

## 2017-04-20 DIAGNOSIS — I10 Essential (primary) hypertension: Secondary | ICD-10-CM | POA: Diagnosis not present

## 2017-04-20 DIAGNOSIS — M109 Gout, unspecified: Secondary | ICD-10-CM | POA: Diagnosis not present

## 2017-04-20 DIAGNOSIS — G4733 Obstructive sleep apnea (adult) (pediatric): Secondary | ICD-10-CM | POA: Diagnosis not present

## 2017-04-20 DIAGNOSIS — I2510 Atherosclerotic heart disease of native coronary artery without angina pectoris: Secondary | ICD-10-CM | POA: Diagnosis not present

## 2017-04-20 DIAGNOSIS — E7849 Other hyperlipidemia: Secondary | ICD-10-CM | POA: Diagnosis not present

## 2017-04-20 DIAGNOSIS — Z800 Family history of malignant neoplasm of digestive organs: Secondary | ICD-10-CM

## 2017-04-20 DIAGNOSIS — E1139 Type 2 diabetes mellitus with other diabetic ophthalmic complication: Secondary | ICD-10-CM | POA: Diagnosis not present

## 2017-04-20 DIAGNOSIS — D510 Vitamin B12 deficiency anemia due to intrinsic factor deficiency: Secondary | ICD-10-CM | POA: Diagnosis not present

## 2017-04-20 DIAGNOSIS — Z86010 Personal history of colonic polyps: Secondary | ICD-10-CM

## 2017-04-20 DIAGNOSIS — Z6832 Body mass index (BMI) 32.0-32.9, adult: Secondary | ICD-10-CM | POA: Diagnosis not present

## 2017-04-20 MED ORDER — NA SULFATE-K SULFATE-MG SULF 17.5-3.13-1.6 GM/177ML PO SOLN: 1.0000 [IU] | Freq: Once | ORAL | 0 refills | Status: AC

## 2017-04-20 NOTE — Progress Notes (Signed)
No egg or soy allergy known to patient  No issues with past sedation with any surgeries  or procedures, no intubation problems  No diet pills per patient No home 02 use per patient  No blood thinners per patient  Pt denies issues with constipation  No A fib or A flutter  EMMI video sent to pt's e mail  Pt. declined 

## 2017-05-04 ENCOUNTER — Encounter: Payer: Self-pay | Admitting: Internal Medicine

## 2017-05-04 ENCOUNTER — Ambulatory Visit (AMBULATORY_SURGERY_CENTER): Payer: Medicare Other | Admitting: Internal Medicine

## 2017-05-04 VITALS — BP 124/62 | HR 53 | Temp 96.2°F | Resp 15 | Ht 66.5 in | Wt 197.0 lb

## 2017-05-04 DIAGNOSIS — Z86010 Personal history of colonic polyps: Secondary | ICD-10-CM | POA: Diagnosis present

## 2017-05-04 DIAGNOSIS — D126 Benign neoplasm of colon, unspecified: Secondary | ICD-10-CM | POA: Diagnosis not present

## 2017-05-04 DIAGNOSIS — D124 Benign neoplasm of descending colon: Secondary | ICD-10-CM | POA: Diagnosis not present

## 2017-05-04 DIAGNOSIS — D123 Benign neoplasm of transverse colon: Secondary | ICD-10-CM

## 2017-05-04 DIAGNOSIS — Z1211 Encounter for screening for malignant neoplasm of colon: Secondary | ICD-10-CM | POA: Diagnosis not present

## 2017-05-04 DIAGNOSIS — K635 Polyp of colon: Secondary | ICD-10-CM | POA: Diagnosis not present

## 2017-05-04 MED ORDER — SODIUM CHLORIDE 0.9 % IV SOLN: 500.0000 mL | INTRAVENOUS | Status: DC

## 2017-05-04 NOTE — Progress Notes (Signed)
To PACU, VSS. Report to [email or web address] 

## 2017-05-04 NOTE — Patient Instructions (Signed)
YOU HAD AN ENDOSCOPIC PROCEDURE TODAY AT [facility]:   Refer to the procedure report that was given to you for any specific questions about what was found during the examination.  If the procedure report does not answer your questions, please call your gastroenterologist to clarify.  If you requested that your care partner not be given the details of your procedure findings, then the procedure report has been included in a sealed envelope for you to review at your convenience later.  YOU SHOULD EXPECT: Some feelings of bloating in the abdomen. Passage of more gas than usual.  Walking can help get rid of the air that was put into your GI tract during the procedure and reduce the bloating. If you had a lower endoscopy (such as a colonoscopy or flexible sigmoidoscopy) you may notice spotting of blood in your stool or on the toilet paper. If you underwent a bowel prep for your procedure, you may not have a normal bowel movement for a few days.  Please Note:  You might notice some irritation and congestion in your nose or some drainage.  This is from the oxygen used during your procedure.  There is no need for concern and it should clear up in a day or so.  SYMPTOMS TO REPORT IMMEDIATELY:   Following lower endoscopy (colonoscopy or flexible sigmoidoscopy):  Excessive amounts of blood in the stool  Significant tenderness or worsening of abdominal pains  Swelling of the abdomen that is new, acute  Fever of 100F or higher   For urgent or emergent issues, a gastroenterologist can be reached at any hour by calling [phone].   DIET:  We do recommend a small meal at first, but then you may proceed to your regular diet.  Drink plenty of fluids but you should avoid alcoholic beverages for 24 hours. Try to eat a high fiber diet, and drink plenty of water.  ACTIVITY:  You should plan to take it easy for the rest of today and you should NOT DRIVE or use heavy machinery until tomorrow  (because of the sedation medicines used during the test).    FOLLOW UP: Our staff will call the number listed on your records the next business day following your procedure to check on you and address any questions or concerns that you may have regarding the information given to you following your procedure. If we do not reach you, we will leave a message.  However, if you are feeling well and you are not experiencing any problems, there is no need to return our call.  We will assume that you have returned to your regular daily activities without incident.  If any biopsies were taken you will be contacted by phone or by letter within the next 1-3 weeks.  Please call us at [phone] if you have not heard about the biopsies in 3 weeks.    SIGNATURES/CONFIDENTIALITY: You and/or your care partner have signed paperwork which will be entered into your electronic medical record.  These signatures attest to the fact that that the information above on your After Visit Summary has been reviewed and is understood.  Full responsibility of the confidentiality of this discharge information lies with you and/or your care-partner.  Read all of the handouts given to you by your recovery room nurse.  Please, consider a sleep apnea test for CPAP. Sleep apnea is not good for your heart.

## 2017-05-04 NOTE — Op Note (Signed)
[name] Patient Name: Nathan [name] Procedure Date: 05/04/2017 8:32 AM MRN: [id number] Endoscopist: Jerene Bears , MD Age: 71 Referring MD:  Date of Birth: 1945/12/17 Gender: Male Account #: [id number] Procedure:                Colonoscopy Indications:              High risk colon cancer surveillance: Personal                            history of multiple (3 or more) adenomas, history                            of large adenoma removed piecemeal in 2016,                            followup colonoscopy with additional polyps in 2017 Medicines:                Monitored Anesthesia Care Procedure:                Pre-Anesthesia Assessment:                           - Prior to the procedure, a History and Physical                            was performed, and patient medications and                            allergies were reviewed. The patient's tolerance of                            previous anesthesia was also reviewed. The risks                            and benefits of the procedure and the sedation                            options and risks were discussed with the patient.                            All questions were answered, and informed consent                            was obtained. Prior Anticoagulants: The patient has                            taken no previous anticoagulant or antiplatelet                            agents. ASA Grade Assessment: III - A patient with                            severe systemic disease. After reviewing the risks  and benefits, the patient was deemed in                            satisfactory condition to undergo the procedure.                           After obtaining informed consent, the colonoscope                            was passed under direct vision. Throughout the                            procedure, the patient's blood pressure, pulse, and                            oxygen saturations  were monitored continuously. The                            Colonoscope was introduced through the anus and                            advanced to the the cecum, identified by                            appendiceal orifice and ileocecal valve. The                            colonoscopy was performed without difficulty. The                            patient tolerated the procedure well. The quality                            of the bowel preparation was good. The ileocecal                            valve, appendiceal orifice, and rectum were                            photographed. Scope In: 8:40:52 AM Scope Out: 8:52:51 AM Scope Withdrawal Time: 0 hours 10 minutes 19 seconds  Total Procedure Duration: 0 hours 11 minutes 59 seconds  Findings:                 The digital rectal exam was normal.                           A 3 mm polyp was found in the transverse colon. The                            polyp was sessile. The polyp was removed with a                            cold snare. Resection and retrieval were complete.  A tattoo was seen in the transverse colon. A                            post-polypectomy scar was found at the tattoo site.                            There was no evidence of residual polyp tissue.                           Three sessile polyps were found in the descending                            colon. The polyps were 1 to 2 mm in size. These                            polyps were removed with a cold snare. Resection                            and retrieval were complete.                           Multiple small and large-mouthed diverticula were                            found in the entire colon.                           Anal papilla(e) were hypertrophied on retroflexion                            in the rectum. Complications:            No immediate complications. Estimated Blood Loss:     Estimated blood loss was minimal. Impression:                - One 3 mm polyp in the transverse colon, removed                            with a cold snare. Resected and retrieved.                           - A tattoo was seen in the transverse colon. A                            post-polypectomy scar was found at the tattoo site.                            There was no evidence of residual polyp tissue.                           - Three 1 to 2 mm polyps in the descending colon,  removed with a cold snare. Resected and retrieved.                           - Moderate diverticulosis in the entire examined                            colon.                           - Anal papilla(e) were hypertrophied. Recommendation:           - Patient has a contact number available for                            emergencies. The signs and symptoms of potential                            delayed complications were discussed with the                            patient. Return to normal activities tomorrow.                            Written discharge instructions were provided to the                            patient.                           - Resume previous diet.                           - Continue present medications.                           - Await pathology results.                           - Repeat colonoscopy in 3 years for surveillance. Jerene Bears, MD 05/04/2017 8:58:58 AM This report has been signed electronically.

## 2017-05-04 NOTE — Progress Notes (Signed)
Called to room to assist during endoscopic procedure.  Patient ID and intended procedure confirmed with present staff. Received instructions for my participation in the procedure from the performing physician.  

## 2017-05-04 NOTE — Progress Notes (Signed)
Pt's states no medical or surgical changes since previsit or office visit. 

## 2017-05-05 ENCOUNTER — Telehealth: Payer: Self-pay

## 2017-05-05 NOTE — Telephone Encounter (Signed)
Left message on answering machine. 

## 2017-05-05 NOTE — Telephone Encounter (Signed)
Left message

## 2017-05-11 ENCOUNTER — Encounter: Payer: Self-pay | Admitting: Internal Medicine

## 2017-06-12 ENCOUNTER — Encounter: Payer: Self-pay | Admitting: Neurology

## 2017-06-13 ENCOUNTER — Encounter (INDEPENDENT_AMBULATORY_CARE_PROVIDER_SITE_OTHER): Payer: Self-pay

## 2017-06-13 ENCOUNTER — Encounter: Payer: Self-pay | Admitting: Neurology

## 2017-06-13 ENCOUNTER — Ambulatory Visit (INDEPENDENT_AMBULATORY_CARE_PROVIDER_SITE_OTHER): Payer: Medicare Other | Admitting: Neurology

## 2017-06-13 VITALS — BP 128/64 | HR 78 | Ht 67.0 in | Wt 196.0 lb

## 2017-06-13 DIAGNOSIS — R001 Bradycardia, unspecified: Secondary | ICD-10-CM

## 2017-06-13 DIAGNOSIS — E1349 Other specified diabetes mellitus with other diabetic neurological complication: Secondary | ICD-10-CM | POA: Insufficient documentation

## 2017-06-13 DIAGNOSIS — G4733 Obstructive sleep apnea (adult) (pediatric): Secondary | ICD-10-CM

## 2017-06-13 DIAGNOSIS — I25731 Atherosclerosis of nonautologous biological coronary artery bypass graft(s) with angina pectoris with documented spasm: Secondary | ICD-10-CM

## 2017-06-13 DIAGNOSIS — F19982 Other psychoactive substance use, unspecified with psychoactive substance-induced sleep disorder: Secondary | ICD-10-CM
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
## 2017-06-13 NOTE — Patient Instructions (Addendum)
Please remember to try to maintain good sleep hygiene, which means: Keep a regular sleep and wake schedule, try not to exercise or have a meal within 2 hours of your bedtime, try to keep your bedroom conducive for sleep, that is, cool and dark, without light distractors such as an illuminated alarm clock, and refrain from watching TV right before sleep or in the middle of the night and do not keep the TV or radio on during the night. Also, try not to use or play on electronic devices at bedtime, such as your cell phone, tablet PC or laptop. If you like to read at bedtime on an electronic device, try to dim the background light as much as possible. Do not eat in the middle of the night.   We will request a sleep study.    We will look for leg twitching and snoring or sleep apnea.   For chronic insomnia, you are best followed by a psychiatrist and/or sleep psychologist.   We will call you with the sleep study results and make a follow up appointment if needed.     Sleep Apnea Sleep apnea is a condition in which breathing pauses or becomes shallow during sleep. Episodes of sleep apnea usually last 10 seconds or longer, and they may occur as many as 20 times an hour. Sleep apnea disrupts your sleep and keeps your body from getting the rest that it needs. This condition can increase your risk of certain health problems, including:  Heart attack.  Stroke.  Obesity.  Diabetes.  Heart failure.  Irregular heartbeat.  There are three kinds of sleep apnea:  Obstructive sleep apnea. This kind is caused by a blocked or collapsed airway.  Central sleep apnea. This kind happens when the part of the brain that controls breathing does not send the correct signals to the muscles that control breathing.  Mixed sleep apnea. This is a combination of obstructive and central sleep apnea.  What are the causes? The most common cause of this condition is a collapsed or blocked airway. An airway can  collapse or become blocked if:  Your throat muscles are abnormally relaxed.  Your tongue and tonsils are larger than normal.  You are overweight.  Your airway is smaller than normal.  What increases the risk? This condition is more likely to develop in people who:  Are overweight.  Smoke.  Have a smaller than normal airway.  Are elderly.  Are male.  Drink alcohol.  Take sedatives or tranquilizers.  Have a family history of sleep apnea.  What are the signs or symptoms? Symptoms of this condition include:  Trouble staying asleep.  Daytime sleepiness and tiredness.  Irritability.  Loud snoring.  Morning headaches.  Trouble concentrating.  Forgetfulness.  Decreased interest in sex.  Unexplained sleepiness.  Mood swings.  Personality changes.  Feelings of depression.  Waking up often during the night to urinate.  Dry mouth.  Sore throat.  How is this diagnosed? This condition may be diagnosed with:  A medical history.  A physical exam.  A series of tests that are done while you are sleeping (sleep study). These tests are usually done in a sleep lab, but they may also be done at home.  How is this treated? Treatment for this condition aims to restore normal breathing and to ease symptoms during sleep. It may involve managing health issues that can affect breathing, such as high blood pressure or obesity. Treatment may include:  Sleeping on your side.  Using a decongestant if you have nasal congestion.  Avoiding the use of depressants, including alcohol, sedatives, and narcotics.  Losing weight if you are overweight.  Making changes to your diet.  Quitting smoking.  Using a device to open your airway while you sleep, such as: ? An oral appliance. This is a custom-made mouthpiece that shifts your lower jaw forward. ? A continuous positive airway pressure (CPAP) device. This device delivers oxygen to your airway through a mask. ? A nasal  expiratory positive airway pressure (EPAP) device. This device has valves that you put into each nostril. ? A bi-level positive airway pressure (BPAP) device. This device delivers oxygen to your airway through a mask.  Surgery if other treatments do not work. During surgery, excess tissue is removed to create a wider airway.  It is important to get treatment for sleep apnea. Without treatment, this condition can lead to:  High blood pressure.  Coronary artery disease.  (Men) An inability to achieve or maintain an erection (impotence).  Reduced thinking abilities.  Follow these instructions at home:  Make any lifestyle changes that your health care provider recommends.  Eat a healthy, well-balanced diet.  Take over-the-counter and prescription medicines only as told by your health care provider.  Avoid using depressants, including alcohol, sedatives, and narcotics.  Take steps to lose weight if you are overweight.  If you were given a device to open your airway while you sleep, use it only as told by your health care provider.  Do not use any tobacco products, such as cigarettes, chewing tobacco, and e-cigarettes. If you need help quitting, ask your health care provider.  Keep all follow-up visits as told by your health care provider. This is important. Contact a health care provider if:  The device that you received to open your airway during sleep is uncomfortable or does not seem to be working.  Your symptoms do not improve.  Your symptoms get worse. Get help right away if:  You develop chest pain.  You develop shortness of breath.  You develop discomfort in your back, arms, or stomach.  You have trouble speaking.  You have weakness on one side of your body.  You have drooping in your face. These symptoms may represent a serious problem that is an emergency. Do not wait to see if the symptoms will go away. Get medical help right away. Call your local emergency  services (911 in the [place] Do not drive yourself to the hospital. This information is not intended to replace advice given to you by your health care provider. Make sure you discuss any questions you have with your health care provider. Document Released: 06/17/2002 Document Revised: 02/21/2016 Document Reviewed: 04/06/2015 Elsevier Interactive Patient Education  [employer].

## 2017-06-21 ENCOUNTER — Ambulatory Visit (INDEPENDENT_AMBULATORY_CARE_PROVIDER_SITE_OTHER): Payer: Medicare Other | Admitting: Cardiology

## 2017-06-21 ENCOUNTER — Encounter: Payer: Self-pay | Admitting: Cardiology

## 2017-06-21 VITALS — BP 114/60 | HR 80 | Ht 67.0 in | Wt 190.8 lb

## 2017-06-21 DIAGNOSIS — Z952 Presence of prosthetic heart valve: Secondary | ICD-10-CM | POA: Diagnosis not present

## 2017-06-21 DIAGNOSIS — Z951 Presence of aortocoronary bypass graft: Secondary | ICD-10-CM | POA: Diagnosis not present

## 2017-06-21 DIAGNOSIS — I208 Other forms of angina pectoris: Secondary | ICD-10-CM

## 2017-06-21 DIAGNOSIS — I25810 Atherosclerosis of coronary artery bypass graft(s) without angina pectoris: Secondary | ICD-10-CM

## 2017-06-21 NOTE — Patient Instructions (Signed)

## 2017-06-21 NOTE — Progress Notes (Signed)
Cardiology Office Note    Date:  06/21/2017   ID:  Jahzier Villalon, DOB [birth year], MRN [id number]  PCP:  Leanna Battles, MD  Cardiologist:   Candee Furbish, MD     History of Present Illness:  Nathan [name] is a 71 y.o. male former patient of Dr. Sherryl Barters with history of coronary artery bypass graft surgery and aortic valve replacement for severe aortic stenosis in April 2010. He has a tissue valve.  He underwent cardiac catheterization on 03/01/16 after experiencing angina-like symptoms and having a nuclear stress test with anterolateral defect. See below for details, SVG to RCA was the only occluded graft. Continued with medical management.  He feels more energy with the isosorbide. Continue.  The patient has a history of high blood pressure, diabetes, and hypercholesterolemia. Also has a history of nightmares and night terrors.   The patient continues to work in a [employer]. 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 He is only working half day on most of the days. He intends to work for another several years until his wife retires from her work.   He is diabetic.Generally he walks for exercise.  During the winter he gets less exercise.  His weight however has been stable.  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 He remembers the [facility] coming to the hospital. Dr. Risa Grill helped save his life he states.He also has dreams, sometimes wakes up in a sweat. Has previously seen a sleep doctor. Could not tolerate CPAP mask. Does not like anything to touch him during sleep.  11/07/16  - CP after the walk when got home. Lasting 1 hours. Moderate in severity. Doesn't remember having heart pain prior to CABG.  RMSF this year. Denies any syncope. He does have mild shortness breath with activity. He's had an occasional skipped beat, no syncope.  12/14/16-overall he is feeling much better. No chest pain, no significant shortness of  breath. He is not feeling any significant palpitations. He told me about the time he was stationed in [place] 1968   06/21/17 -Overall he is doing quite well, no chest pain syncope bleeding orthopnea PND.  Occasionally he will have arthritis in his hands and would like to take an occasional aspirin.  He was noted to have periods of apnea during colonoscopy and is seeing neurology for sleep study.  He is claustrophobic and does not like to sleep with anything touching him.  He may have difficulty with this.   Past Medical History:  Diagnosis Date  . Allergy   . Aortic stenosis   . Arthritis   . Blood transfusion without reported diagnosis   . CAD (coronary artery disease)   . Cataract    both eyes surgically removed  . Diabetes mellitus, type 2 ([place])   . Diverticulosis of colon (without mention of hemorrhage)   . Duodenal ulcer perforation ([place])    blood transfusion  . 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 GERD (gastroesophageal reflux disease)   . Glaucoma    pre glaucoma on Xalatan drops  . Heart murmur   . History of diabetic retinopathy   . History of gastrointestinal bleeding   . History of necrotizing fasciitis   . History of prosthetic aortic valve   . Hypercholesteremia   . Myocardial infarction ([place]) 02/23/2016  . Nightmares    CHRONIC  . OSA (obstructive sleep apnea)    borderline no CPAP   . Personal history of colonic polyps 11/23/2009   TUBULAR ADENOMA  .  Postoperative anemia   . Sinus bradycardia   . Sleep apnea    no cpap  . Stomach ulcer    Hx of  . Systolic hypertension   . Vitamin B12 deficiency     Past Surgical History:  Procedure Laterality Date  . AORTIC VALVE REPLACEMENT    . BLEPHAROPLASTY  11-24-14  . CARDIAC CATHETERIZATION N/A 03/01/2016   Procedure: Coronary/Graft Angiography;  Surgeon: Peter M Martinique, MD;  Location: [facility];  Service: Cardiovascular;  Laterality: N/A;  . CATARACT EXTRACTION W/ INTRAOCULAR LENS IMPLANT     OD  .  COLONOSCOPY    . CORONARY ARTERY BYPASS GRAFT  2010   x 4  . flesh eating disease surgery      surgeries x 7   . [place]  . POLYPECTOMY    . RETINAL LASER PROCEDURE    . [place]   resection  . UMBILICAL HERNIA REPAIR N/A 04/10/2013   Procedure: HERNIA REPAIR UMBILICAL ADULT with mesh;  Surgeon: Pedro Earls, MD;  Location: [facility];  Service: General;  Laterality: N/A;    Current Medications: Outpatient Medications Prior to Visit  Medication Sig Dispense Refill  . allopurinol (ZYLOPRIM) 300 MG tablet Take 300 mg by mouth daily.    Marland Kitchen ALPRAZolam (XANAX) 0.25 MG tablet Take 0.25 mg by mouth daily as needed for anxiety.     Marland Kitchen amLODipine (NORVASC) 5 MG tablet Take 5 mg by mouth every morning.     . Ascorbic Acid (VITAMIN C) 1000 MG tablet Take 1,000 mg by mouth daily.    Marland Kitchen aspirin 81 MG tablet Take 1 tablet (81 mg total) by mouth daily.    Marland Kitchen atorvastatin (LIPITOR) 20 MG tablet Take 20 mg by mouth daily.  3  . B-D ULTRAFINE III SHORT PEN 31G X 8 MM MISC Inject 1 application into the skin daily.  5  . BAYER CONTOUR TEST test strip USE 1 STRIP THREE TIMES DAILY TO CHECK BLOOD SUGARS.  11  . Cholecalciferol (VITAMIN D3) 1000 UNITS capsule Take 10,000 Units by mouth daily.     Marland Kitchen co-enzyme Q-10 30 MG capsule Take 200 mg by mouth 2 (two) times daily.     . Cyanocobalamin (VITAMIN B-12 IJ) Inject 1,000 mcg into the muscle every 30 (thirty) days. Amt/dose is unknown    . cycloSPORINE (RESTASIS) 0.05 % ophthalmic emulsion Place 1 drop into both eyes 2 (two) times daily.    . fish oil-omega-3 fatty acids 1000 MG capsule Take 2 g by mouth daily.     Marland Kitchen glipiZIDE (GLUCOTROL XL) 5 MG 24 hr tablet Take 5 mg by mouth 2 (two) times daily after a meal.  3  . glipiZIDE (GLUCOTROL) 5 MG tablet Take 5 mg by mouth every morning.     . hydrochlorothiazide (MICROZIDE) 12.5 MG capsule Take 12.5 mg by mouth daily. Reported on 08/20/2015    . HYDROcodone-acetaminophen (VICODIN) 5-500 MG per  tablet Take 1 tablet by mouth daily as needed for pain.     . isosorbide mononitrate (IMDUR) 30 MG 24 hr tablet Take 30 mg by mouth daily.  11  . latanoprost (XALATAN) 0.005 % ophthalmic solution Place 1 drop into both eyes at bedtime.      Marland Kitchen LEVEMIR FLEXTOUCH 100 UNIT/ML Pen Inject 18 Units into the skin daily.  12  . losartan (COZAAR) 50 MG tablet Take 1 tablet (50 mg total) by mouth daily. 90 tablet 1  .  metFORMIN (GLUCOPHAGE) 1000 MG tablet Take 1,000 mg by mouth daily with breakfast.    . metoprolol (LOPRESSOR) 50 MG tablet Take 50 mg by mouth 2 (two) times daily.     . minoxidil (LONITEN) 10 MG tablet Take 10 mg by mouth daily.   5  . Multiple Vitamin (MULTIVITAMIN) tablet Take 1 tablet by mouth daily.      . nitroGLYCERIN (NITROSTAT) 0.4 MG SL tablet Place 1 tablet under the tongue as needed.  12  . omeprazole (PRILOSEC) 40 MG capsule Take 40 mg by mouth 2 (two) times daily.  2  . oxyCODONE-acetaminophen (PERCOCET/ROXICET) 5-325 MG tablet Take 1 tablet by mouth daily as needed for moderate pain or severe pain.     Marland Kitchen oxymetazoline (AFRIN) 0.05 % nasal spray Place 2 sprays into the nose 2 (two) times daily as needed for congestion. Reported on 08/20/2015     No facility-administered medications prior to visit.      Allergies:   Shellfish-derived products and Zolpidem tartrate   Social History   Socioeconomic History  . Marital status: Married    Spouse name: None  . Number of children: 0  . Years of education: None  . Highest education level: None  Social Needs  . Financial resource strain: None  . Food insecurity - worry: None  . Food insecurity - inability: None  . Transportation needs - medical: None  . Transportation needs - non-medical: None  Occupational History  . Occupation: Scientist, clinical (histocompatibility and immunogenetics): [employer]  Tobacco Use  . Smoking status: Never Smoker  . Smokeless tobacco: Never Used  Substance and Sexual Activity  . Alcohol use: No    Alcohol/week: 0.0 oz  .  Drug use: No  . Sexual activity: None  Other Topics Concern  . None  Social History Narrative  . None     Family History:  The patient's family history includes Alcohol abuse in his father; Cirrhosis in his father; Colon cancer (age of onset: 28) in his mother; Diabetes in his mother and sister; Hypertension in his mother and sister; Rectal cancer in his mother.   ROS:   Unless stated above, all other review of systems negative   ROS    PHYSICAL EXAM:   VS:  BP 114/60   Pulse 80   Ht 5\' 7"  (1.702 m)   Wt 190 lb 12.8 oz (86.5 kg)   SpO2 94%   BMI 29.88 kg/m    GEN: Well nourished, well developed, in no acute distress  HEENT: normal  Neck: no JVD, carotid bruits, or masses Cardiac: RRR; 2/6 SM, no rubs, or gallops,no edema  Respiratory:  clear to auscultation bilaterally, normal work of breathing GI: soft, nontender, nondistended, + BS, obese MS: no deformity or atrophy  Skin: warm and dry, no rash Neuro:  Alert and Oriented x 3, Strength and sensation are intact Psych: euthymic mood, full affect    Wt Readings from Last 3 Encounters:  06/21/17 190 lb 12.8 oz (86.5 kg)  06/13/17 196 lb (88.9 kg)  05/04/17 197 lb (89.4 kg)      Studies/Labs Reviewed:   EKG:    11-14-2016-sinus rhythm septal infarct pattern, nonspecific T-wave changes, no other abnormalities, poor R-wave progression noted. The ekg ordered today demonstrates 02/09/16-sinus rhythm, 63, no other abnormalities.  ECHO 11/09/16  - Left ventricle: The cavity size was normal. Wall thickness was   increased in a pattern of mild LVH. Systolic function was normal.   The  estimated ejection fraction was in the range of 55% to 60%.   Wall motion was normal; there were no regional wall motion   abnormalities. Features are consistent with a pseudonormal left   ventricular filling pattern, with concomitant abnormal relaxation   and increased filling pressure (grade 2 diastolic dysfunction). - Aortic valve: A bioprosthesis  was present. - Ascending aorta: The ascending aorta was mildly dilated. - Mitral valve: Calcified annulus. - Left atrium: The atrium was mildly dilated. - Pulmonary arteries: Systolic pressure was mildly increased. PA   peak pressure: 32 mm Hg (S).  Impressions:  - Normal LV systolic function; moderate diastolic dysfunction; s/p   AVR with mildly elevated mean gradient (19 mmHg); no AI; mildly   dilated ascending aorta; mild LAE; trace TR with mildly elevated   pulmonary pressure.  Nuclear stress test 2011  - Normal   CABG op report 10/20/08: Coronary artery bypass grafting x3 with the left  internal mammary to the left anterior descending coronary artery, reverse saphenous vein graft to circumflex coronary artery, reverse saphenous vein graft to the posterior descending coronary artery and aortic valve replacement with a pericardial tissue valve.  [employer], [name], 23-mm, serial number [id number], with bilateral thigh endovein harvesting.  SURGEON:  Lanelle Bal, MD   Recent Labs: No results found for requested labs within last 8760 hours.   Lipid Panel No results found for: CHOL, TRIG, HDL, CHOLHDL, VLDL, LDLCALC, LDLDIRECT  Additional studies/ records that were reviewed today include:  Prior office notes reviewed  NUC stress 02/23/16:  Nuclear stress EF: 58%. Apical hypokinesis.  There was no ST segment deviation noted during stress.  Defect 1: There is a medium defect of moderate severity present in the mid anterior, mid anteroseptal and mid anterolateral location. This defect is partially fixed however does demonstrate some reversibility along the anterior/anterolateral distribution.  Findings consistent with prior myocardial infarction with peri-infarct ischemia.  This is an intermediate risk study.  Cath 03/01/16:  Mid RCA lesion, 50 %stenosed.  Mid RCA to Dist RCA lesion, 100 %stenosed.  Prox Cx to Mid Cx lesion, 99 %stenosed.  Prox LAD  to Mid LAD lesion, 100 %stenosed.  SVG to RCA is occluded  Origin lesion, 100 %stenosed.  SVG to OM2 is widely patent.  LIMA and is normal in caliber and anatomically normal.   1. Severe 3 vessel occlusive CAD 2. Patent LIMA to the LAD 3. Patent SVG to large OM2 4. Occluded SVG to PDA. Distal RCA is occluded and fills by left to right collaterals.   Plan: recommend medical management.   ASSESSMENT:    1. Coronary artery disease involving autologous vein coronary bypass graft without angina pectoris   2. Hx of CABG   3. Angina decubitus ([place])   4. S/P AVR (aortic valve replacement)      PLAN:  In order of problems listed above:  Chest pain/short of breath/Angina  - Reassuring cardiac catheterization 03/01/16-medical management  -  He does not recall any particular symptoms prior to his bypass surgery/aortic valve replacement. NUC stress was intermediate risk with defect in the anterior anterolateral distribution that was partially reversible.   - isosorbide 30 mg once a day. Long acting nitrate. Continue with metoprolol. This seems to be helping quite a bit. We will not make any changes today.  - If necessary, he can take a nitroglycerin. Watch for worsening anginal symptoms. Conditioning will help this. Could always consider Ranexa in the future.  Coronary artery disease status  post bypass surgery 2011  - decrease aspirin 81 mg  - Continue with metoprolol, atorvastatin 20, losartan  - Prior cardiac catheterization reviewed as above. Stable.  Aortic valve replacement  - 11/09/16 - ECHO - Normal EF. Bioprosthetic AVR is functioning normally.   Palpitations  - By description likely PVCs or PACs. His EKG today shows no evidence of atrial fibrillation. If necessary, we can always place an event monitor in the future. No longer feeling them.   History of GI bleed-remember in 2010 he had a gastric ulcer with hemorrhage.  Hyperlipidemia  - Continuing with statin  therapy  Obesity  -Encourage weight loss, glad he is out walking, or took his first walk. Continue to encourage conditioning.  Diabetes with hyperlipidemia  - Medications reviewed, Dr. Sharlett Iles has been monitoring, no changes made  Obstructive sleep apnea-diagnosed in 2009 intolerant of CPAP.  Medication Adjustments/Labs and Tests Ordered: Current medicines are reviewed at length with the patient today.  Concerns regarding medicines are outlined above.  Medication changes, Labs and Tests ordered today are listed in the Patient Instructions below. Patient Instructions  Medication Instructions:  The current medical regimen is effective;  continue present plan and medications.  Follow-Up: Follow up in 1 year with Dr. Marlou Porch.  You will receive a letter in the mail 2 months before you are due.  Please call us when you receive this letter to schedule your follow up appointment.  If you need a refill on your cardiac medications before your next appointment, please call your pharmacy.  Thank you for choosing [facility]!!        Signed, Candee Furbish, MD  06/21/2017 4:27 PM    [address], [place], [place]  [address] Phone: [phone]; Fax: [phone]

## 2017-06-22 ENCOUNTER — Encounter: Payer: Self-pay | Admitting: Podiatry

## 2017-06-22 ENCOUNTER — Ambulatory Visit (INDEPENDENT_AMBULATORY_CARE_PROVIDER_SITE_OTHER): Payer: Medicare Other | Admitting: Podiatry

## 2017-06-22 DIAGNOSIS — M79676 Pain in unspecified toe(s): Secondary | ICD-10-CM | POA: Diagnosis not present

## 2017-06-22 DIAGNOSIS — H401132 Primary open-angle glaucoma, bilateral, moderate stage: Secondary | ICD-10-CM | POA: Diagnosis not present

## 2017-06-22 DIAGNOSIS — B351 Tinea unguium: Secondary | ICD-10-CM | POA: Diagnosis not present

## 2017-06-22 DIAGNOSIS — E1142 Type 2 diabetes mellitus with diabetic polyneuropathy: Secondary | ICD-10-CM

## 2017-06-22 DIAGNOSIS — H26491 Other secondary cataract, right eye: Secondary | ICD-10-CM | POA: Diagnosis not present

## 2017-06-22 DIAGNOSIS — E113552 Type 2 diabetes mellitus with stable proliferative diabetic retinopathy, left eye: Secondary | ICD-10-CM | POA: Diagnosis not present

## 2017-06-22 DIAGNOSIS — E113551 Type 2 diabetes mellitus with stable proliferative diabetic retinopathy, right eye: Secondary | ICD-10-CM | POA: Diagnosis not present

## 2017-06-22 NOTE — Progress Notes (Signed)
He presents today with a chief complaint of painful elongated toenails.  Objective: His toenails are long thick yellow dystrophic onychomycotic type 2 diabetes has rendered him with diabetic peripheral neuropathy.  Pain in limb secondary to onychomycosis.  Assessment: Pain listening to onychomycosis and neuropathy.  Plan: Debridement of toenails 1 through 5 bilateral coverage service secondary to pain and neuropathy.

## 2017-07-13 DIAGNOSIS — D510 Vitamin B12 deficiency anemia due to intrinsic factor deficiency: Secondary | ICD-10-CM | POA: Diagnosis not present

## 2017-07-18 DIAGNOSIS — D510 Vitamin B12 deficiency anemia due to intrinsic factor deficiency: Secondary | ICD-10-CM | POA: Diagnosis not present

## 2017-07-18 DIAGNOSIS — M109 Gout, unspecified: Secondary | ICD-10-CM | POA: Diagnosis not present

## 2017-07-18 DIAGNOSIS — E1139 Type 2 diabetes mellitus with other diabetic ophthalmic complication: Secondary | ICD-10-CM | POA: Diagnosis not present

## 2017-07-18 DIAGNOSIS — R82998 Other abnormal findings in urine: Secondary | ICD-10-CM | POA: Diagnosis not present

## 2017-07-18 DIAGNOSIS — E7849 Other hyperlipidemia: Secondary | ICD-10-CM | POA: Diagnosis not present

## 2017-07-18 DIAGNOSIS — Z125 Encounter for screening for malignant neoplasm of prostate: Secondary | ICD-10-CM | POA: Diagnosis not present

## 2017-07-25 DIAGNOSIS — Z0000 Encounter for general adult medical examination without abnormal findings: Secondary | ICD-10-CM | POA: Diagnosis not present

## 2017-07-25 DIAGNOSIS — Z6831 Body mass index (BMI) 31.0-31.9, adult: Secondary | ICD-10-CM | POA: Diagnosis not present

## 2017-07-25 DIAGNOSIS — Z1389 Encounter for screening for other disorder: Secondary | ICD-10-CM | POA: Diagnosis not present

## 2017-07-25 DIAGNOSIS — I2510 Atherosclerotic heart disease of native coronary artery without angina pectoris: Secondary | ICD-10-CM | POA: Diagnosis not present

## 2017-07-25 DIAGNOSIS — E7849 Other hyperlipidemia: Secondary | ICD-10-CM | POA: Diagnosis not present

## 2017-07-25 DIAGNOSIS — M109 Gout, unspecified: Secondary | ICD-10-CM | POA: Diagnosis not present

## 2017-07-25 DIAGNOSIS — I10 Essential (primary) hypertension: Secondary | ICD-10-CM | POA: Diagnosis not present

## 2017-07-25 DIAGNOSIS — D510 Vitamin B12 deficiency anemia due to intrinsic factor deficiency: Secondary | ICD-10-CM | POA: Diagnosis not present

## 2017-07-25 DIAGNOSIS — E1139 Type 2 diabetes mellitus with other diabetic ophthalmic complication: Secondary | ICD-10-CM | POA: Diagnosis not present

## 2017-07-25 DIAGNOSIS — G4733 Obstructive sleep apnea (adult) (pediatric): Secondary | ICD-10-CM | POA: Diagnosis not present

## 2017-07-25 DIAGNOSIS — Z794 Long term (current) use of insulin: Secondary | ICD-10-CM | POA: Diagnosis not present

## 2017-08-02 ENCOUNTER — Ambulatory Visit (INDEPENDENT_AMBULATORY_CARE_PROVIDER_SITE_OTHER): Payer: Medicare Other | Admitting: Neurology

## 2017-08-02 DIAGNOSIS — G4733 Obstructive sleep apnea (adult) (pediatric): Secondary | ICD-10-CM | POA: Diagnosis not present

## 2017-08-02 DIAGNOSIS — R001 Bradycardia, unspecified: Secondary | ICD-10-CM

## 2017-08-02 DIAGNOSIS — I25731 Atherosclerosis of nonautologous biological coronary artery bypass graft(s) with angina pectoris with documented spasm: Secondary | ICD-10-CM

## 2017-08-02 DIAGNOSIS — F19982 Other psychoactive substance use, unspecified with psychoactive substance-induced sleep disorder: Secondary | ICD-10-CM

## 2017-08-02 DIAGNOSIS — E1349 Other specified diabetes mellitus with other diabetic neurological complication: Secondary | ICD-10-CM

## 2017-08-04 DIAGNOSIS — Z1212 Encounter for screening for malignant neoplasm of rectum: Secondary | ICD-10-CM | POA: Diagnosis not present

## 2017-08-08 ENCOUNTER — Telehealth: Payer: Self-pay | Admitting: Neurology

## 2017-08-08 DIAGNOSIS — F5101 Primary insomnia: Secondary | ICD-10-CM

## 2017-08-08 NOTE — Telephone Encounter (Signed)
-----   Message from Larey Seat, MD sent at 08/08/2017  9:06 AM EST ----- Very little, very fragmented sleep noted, Irregular EKG, but mild apnea ( only hypopneas- not full apneas were seen).  Recommend referral to cognitive behavior therapy.

## 2017-08-08 NOTE — Procedures (Signed)
PATIENT'S NAME:  Nathan [name], Nathan [name] DOB:      December 10, 1945      MR#:    [id number]     DATE OF RECORDING: 08/02/2017 REFERRING M.D.:  Leanna Battles, M.D. Study Performed:   Baseline Polysomnogram with expanded EEG and CO2 monitoring HISTORY:  Mr. Bergen is a 72 year old male patient, who had a PSG in the year 2009, and was found to have borderline, mild apnea. Diagnosis:  Diabetes mellitus, RETINOPATHY, NEUROPATHY, NEPHROLITHIASIS, GLAUCOMA, HTN, DIVERTICULOSIS, GASTRIC ULCER, and AORTIC VALVE REPLACEMENT.  Wife noted snoring and apnea. He was observed having apneic breathing after a medical procedure under anesthesia. 2 years ago he suffered a heart attack during a chemically induced stress test, was admitted the next day to hospital, was treated medically (no intervention performed at the time).  Since being treated with Imdur, he had less sleepiness and felt more energized.   The patient endorsed the Epworth Sleepiness Scale at 1 point.   The patient's weight 196 pounds with a height of 67 (inches), resulting in a BMI of 30.8 kg/m2. The patient's neck circumference measured 16 inches.  CURRENT MEDICATIONS: Zyloprim, Xanax, Norvasc, Aspirin, Lipitor, Restasis, Glipizide, Microzide, Vicodin, Imdur, Xalatan, Zocor, Metformin, Lopressor, Nitrostat, Prilosec, Percocet, Afrin   PROCEDURE:  This is a multichannel digital polysomnogram utilizing the Somnostar 11.2 system.  Electrodes and sensors were applied and monitored per AASM Specifications.   EEG, EOG, Chin and Limb EMG, were sampled at 200 Hz.  ECG, Snore and Nasal Pressure, Thermal Airflow, Respiratory Effort, CPAP Flow and Pressure, Oximetry was sampled at 50 Hz. Digital video and audio were recorded.      BASELINE STUDY: Lights Out was at 21:51 and Lights On at 04:50.  Total recording time (TRT) was 419.5 minutes, with a total sleep time (TST) of 99 minutes.    The patient's sleep latency was 195 minutes.  REM latency was 348 minutes.   The sleep  efficiency was extremely poor at 23.6 %.     SLEEP ARCHITECTURE: WASO (Wake after sleep onset) total was 262 minutes.  There were 22.5 minutes in Stage N1, 44.5 minutes Stage N2, 27 minutes Stage N3 and 5 minutes in Stage REM.  The percentage of Stage N1 was 22.7%, Stage N2 was 44.9%, Stage N3 was 27.3% and Stage R (REM sleep) was 5.1%.   RESPIRATORY ANALYSIS:  There were a total of 25 respiratory events:  0 apneas and 25 hypopneas with a hypopnea index of 15.2 /hour. The patient also had 0 respiratory event related arousals (RERAs).     The total APNEA/HYPOPNEA INDEX (AHI) was 15.2/hour and the total RESPIRATORY DISTURBANCE INDEX was 15.2 /hour.  0 events occurred in REM sleep and 50 events in NREM. The REM AHI was 0.0 /hour, versus a non-REM AHI of 16. The patient spent 8.5 minutes of total sleep time in the supine position and 91 minutes in non-supine. The supine AHI was 0.0 versus a non-supine AHI of 16.6.  OXYGEN SATURATION & C02:  The Wake baseline 02 saturation was 96%, with the lowest being 81%. Time spent below 89% saturation equaled 8 minutes. Co2 monitoring was non- conclusive.    PERIODIC LIMB MOVEMENTS:  The patient had a total of 0 Periodic Limb Movements.  Post-study, the patient indicated that sleep was the same as usual. The arousals were noted as: 39 were spontaneous, 0 were associated with PLMs, and 6 were associated with respiratory events. EKG was irregular. Audio and video analysis did show frequent interruptions  of sleep, the lights in the bedroom were on throughout the night, and the patient was reading.   The patient took 2 bathroom breaks.  IMPRESSION: Non organic insomnia.  1. Only mild Obstructive Sleep Apnea (OSA), mostly hypopneas without prolonged hypoxemia.  2. Extremely fragmented sleep- mainly due to spontaneous arousals, only secondary due to hypopneas.  RECOMMENDATIONS:  1. Avoid caffeine-containing beverages, energy drinks and chocolate. 2. Consider dedicated  cognitive behavior therapy referral to improve sleep hygiene and insomnia as main clinical concern.   3. A follow up appointment will be scheduled in the Sleep Clinic at [facility]. The referring provider will be notified of the results.      I certify that I have reviewed the entire raw data recording prior to the issuance of this report in accordance with the Standards of Accreditation of the American Academy of Sleep Medicine (AASM). There were not enough data ( sleep time ) to follow a SPLIT protocol.       Larey Seat, MD     01.29.2019 Diplomat, American Board of Psychiatry and Neurology  Diplomat, [place] [facility] Market researcher, [facility] [employer]

## 2017-08-08 NOTE — Telephone Encounter (Signed)
Called patient to discuss sleep study results. No answer at this time. LVM for the patient to call back.   

## 2017-08-09 NOTE — Addendum Note (Signed)
Addended by: Darleen Crocker on: 08/09/2017 10:57 AM   Modules accepted: Orders

## 2017-08-09 NOTE — Telephone Encounter (Signed)
Pt called back and I was able to go over the sleep study. I informed him that Dr Brett Fairy is recommending a cognitvie behavior therapy referral. I explained what all that entails and pt is interested in the referral if it is covered with his insurance. I will place the order and send to referrals department.

## 2017-09-06 DIAGNOSIS — D510 Vitamin B12 deficiency anemia due to intrinsic factor deficiency: Secondary | ICD-10-CM | POA: Diagnosis not present

## 2017-09-19 ENCOUNTER — Encounter: Payer: Self-pay | Admitting: Podiatry

## 2017-09-19 ENCOUNTER — Ambulatory Visit (INDEPENDENT_AMBULATORY_CARE_PROVIDER_SITE_OTHER): Payer: Medicare Other | Admitting: Podiatry

## 2017-09-19 DIAGNOSIS — B351 Tinea unguium: Secondary | ICD-10-CM

## 2017-09-19 DIAGNOSIS — M79676 Pain in unspecified toe(s): Secondary | ICD-10-CM

## 2017-09-19 DIAGNOSIS — E1142 Type 2 diabetes mellitus with diabetic polyneuropathy: Secondary | ICD-10-CM

## 2017-09-19 MED ORDER — MUPIROCIN 2 % EX OINT: TOPICAL_OINTMENT | CUTANEOUS | 2 refills | Status: DC

## 2017-09-19 NOTE — Progress Notes (Signed)
He presents today chief complaint of painful elongated toenails 1 through 5 bilaterally he also has an excoriation or an abrasion to the distal aspect of the DIPJ second digit left foot.  Denies fever chills nausea vomiting muscle aches and pains states that he does not know how he scratched his toe.  Objective: Vital signs are stable he is alert and oriented x3.  Pulses are palpable.  Neurologic sensorium is intact slightly diminished.  No open lesions or wounds other than the small excoriation overlying the DIPJ with mild erythema no cellulitis drainage or odor.  Toenails are long thick yellow dystrophic-like mycotic painful palpation.  Assessment: Excoriation second DIPJ left foot.  Non-complicated.  Pain in limb secondary to onychomycosis.  Plan: Debrided all reactive hyperkeratosis wrote a prescription for Bactroban ointment to be applied once or twice daily.

## 2017-10-12 DIAGNOSIS — D510 Vitamin B12 deficiency anemia due to intrinsic factor deficiency: Secondary | ICD-10-CM | POA: Diagnosis not present

## 2017-10-19 ENCOUNTER — Ambulatory Visit (INDEPENDENT_AMBULATORY_CARE_PROVIDER_SITE_OTHER): Payer: Medicare Other | Admitting: Podiatry

## 2017-10-19 ENCOUNTER — Encounter: Payer: Self-pay | Admitting: Podiatry

## 2017-10-19 DIAGNOSIS — I25731 Atherosclerosis of nonautologous biological coronary artery bypass graft(s) with angina pectoris with documented spasm: Secondary | ICD-10-CM

## 2017-10-19 DIAGNOSIS — S90812D Abrasion, left foot, subsequent encounter: Secondary | ICD-10-CM | POA: Diagnosis not present

## 2017-10-21 NOTE — Progress Notes (Signed)
He presents today for follow-up of an abrasion second digit left foot.  He states that he still has not healed 100%.  Objective: Vital signs are stable he is alert and oriented x3.  There is no longer any erythema edema saline strange odor from the lesion.  There is a small area of reactive hyperkeratosis was debrided today demonstrates absolutely no open wound whatsoever.  Assessment: Healing excoriation superficial wound DIPJ mallet toe deformity second left.  Plan: Debrided the area today we will follow-up with him on an as-needed basis.

## 2017-11-02 DIAGNOSIS — I10 Essential (primary) hypertension: Secondary | ICD-10-CM | POA: Diagnosis not present

## 2017-11-02 DIAGNOSIS — G4733 Obstructive sleep apnea (adult) (pediatric): Secondary | ICD-10-CM | POA: Diagnosis not present

## 2017-11-02 DIAGNOSIS — E668 Other obesity: Secondary | ICD-10-CM | POA: Diagnosis not present

## 2017-11-02 DIAGNOSIS — E1139 Type 2 diabetes mellitus with other diabetic ophthalmic complication: Secondary | ICD-10-CM | POA: Diagnosis not present

## 2017-11-02 DIAGNOSIS — Z794 Long term (current) use of insulin: Secondary | ICD-10-CM | POA: Diagnosis not present

## 2017-11-02 DIAGNOSIS — Z6831 Body mass index (BMI) 31.0-31.9, adult: Secondary | ICD-10-CM | POA: Diagnosis not present

## 2017-11-02 DIAGNOSIS — E7849 Other hyperlipidemia: Secondary | ICD-10-CM | POA: Diagnosis not present

## 2017-11-02 DIAGNOSIS — I2510 Atherosclerotic heart disease of native coronary artery without angina pectoris: Secondary | ICD-10-CM | POA: Diagnosis not present

## 2017-11-07 ENCOUNTER — Other Ambulatory Visit: Payer: Self-pay | Admitting: Neurology

## 2017-11-07 DIAGNOSIS — G4733 Obstructive sleep apnea (adult) (pediatric): Secondary | ICD-10-CM

## 2017-11-16 ENCOUNTER — Encounter: Payer: Self-pay | Admitting: Podiatry

## 2017-11-16 ENCOUNTER — Ambulatory Visit (INDEPENDENT_AMBULATORY_CARE_PROVIDER_SITE_OTHER): Payer: Medicare Other | Admitting: Podiatry

## 2017-11-16 DIAGNOSIS — I25731 Atherosclerosis of nonautologous biological coronary artery bypass graft(s) with angina pectoris with documented spasm: Secondary | ICD-10-CM | POA: Diagnosis not present

## 2017-11-16 DIAGNOSIS — S90812D Abrasion, left foot, subsequent encounter: Secondary | ICD-10-CM | POA: Diagnosis not present

## 2017-11-16 NOTE — Progress Notes (Signed)
He presents today for follow-up of an abrasion third digit left foot.  States that seems to be getting better but I does want someone else to take a look at it.  Objective: Vital signs are stable he is alert and oriented x3 abrasion to the lateral aspect of the DIPJ second digit left foot demonstrates completely healed wound.  There is no open lesions no wounds no drainage no cellulitis drainage or odor.  Assessment: Well-healing abrasion second digit left.  Plan: Follow-up with him at his regular routine for nail debridement.

## 2017-11-29 ENCOUNTER — Telehealth: Payer: Self-pay

## 2017-11-29 NOTE — Telephone Encounter (Signed)
Confirmed the mild OSA- diagnosis. Cannot tolerate CPAP , will refer to inspire procedure or special dentistry- his preference . Dentistry only works if he has natural or implanted teeth to anchor the device to. , His AHI was mild ! Mild apnea.

## 2017-11-29 NOTE — Telephone Encounter (Signed)
Patient came to sleep lab for CPAP titration on 11-28-17. He could not tolerate cpap and wanted to leave. He will need a follow-up appointment to discuss options.

## 2017-11-30 NOTE — Telephone Encounter (Signed)
Pt called back and I went over the sleep apnea alternatives. I reviewed a dental device with him and explained what that means and how it works. I reviewed the inspire procedure vaguely but with his apnea so mild I think the dental device would be a better option. Pt states that he just doesn't think he would be compliant with it and would rather not proceed forward with treatment at this time. I have informed him that when he is ready to call or if any concerns comes up give [place] a call. Pt verbalized understanding. Pt had no questions at this time but was encouraged to call back if questions arise.

## 2017-11-30 NOTE — Telephone Encounter (Signed)
Called the patient to review if he would like to pursue other ways to treat the apnea. No answer LVM for the pt to call back.

## 2017-12-20 DIAGNOSIS — D510 Vitamin B12 deficiency anemia due to intrinsic factor deficiency: Secondary | ICD-10-CM | POA: Diagnosis not present

## 2017-12-21 ENCOUNTER — Ambulatory Visit (INDEPENDENT_AMBULATORY_CARE_PROVIDER_SITE_OTHER): Payer: Medicare Other | Admitting: Podiatry

## 2017-12-21 ENCOUNTER — Encounter: Payer: Self-pay | Admitting: Podiatry

## 2017-12-21 DIAGNOSIS — M79676 Pain in unspecified toe(s): Secondary | ICD-10-CM

## 2017-12-21 DIAGNOSIS — E1142 Type 2 diabetes mellitus with diabetic polyneuropathy: Secondary | ICD-10-CM

## 2017-12-21 DIAGNOSIS — B351 Tinea unguium: Secondary | ICD-10-CM | POA: Diagnosis not present

## 2017-12-21 NOTE — Progress Notes (Signed)
Presents today for chief complaint of painful elongated toenails.  Objective: Vital signs stable alert and oriented x3.  Pulses are palpable.  Toenails are long thick yellow dystrophic-like mycotic painful palpation as well as debridement.  Assessment: Pain in limb secondary to onychomycosis.  Plan: Debridement of toenails 1 through 5 bilateral cover service secondary to pain.

## 2018-01-10 ENCOUNTER — Other Ambulatory Visit: Payer: Self-pay | Admitting: Cardiology

## 2018-01-25 DIAGNOSIS — E113551 Type 2 diabetes mellitus with stable proliferative diabetic retinopathy, right eye: Secondary | ICD-10-CM | POA: Diagnosis not present

## 2018-01-25 DIAGNOSIS — H26491 Other secondary cataract, right eye: Secondary | ICD-10-CM | POA: Diagnosis not present

## 2018-01-25 DIAGNOSIS — H401132 Primary open-angle glaucoma, bilateral, moderate stage: Secondary | ICD-10-CM | POA: Diagnosis not present

## 2018-01-25 DIAGNOSIS — E113552 Type 2 diabetes mellitus with stable proliferative diabetic retinopathy, left eye: Secondary | ICD-10-CM | POA: Diagnosis not present

## 2018-01-31 DIAGNOSIS — D510 Vitamin B12 deficiency anemia due to intrinsic factor deficiency: Secondary | ICD-10-CM | POA: Diagnosis not present

## 2018-02-15 DIAGNOSIS — I2510 Atherosclerotic heart disease of native coronary artery without angina pectoris: Secondary | ICD-10-CM | POA: Diagnosis not present

## 2018-02-15 DIAGNOSIS — I10 Essential (primary) hypertension: Secondary | ICD-10-CM | POA: Diagnosis not present

## 2018-02-15 DIAGNOSIS — Z6831 Body mass index (BMI) 31.0-31.9, adult: Secondary | ICD-10-CM | POA: Diagnosis not present

## 2018-02-15 DIAGNOSIS — Z794 Long term (current) use of insulin: Secondary | ICD-10-CM | POA: Diagnosis not present

## 2018-02-15 DIAGNOSIS — G4733 Obstructive sleep apnea (adult) (pediatric): Secondary | ICD-10-CM | POA: Diagnosis not present

## 2018-02-15 DIAGNOSIS — E1139 Type 2 diabetes mellitus with other diabetic ophthalmic complication: Secondary | ICD-10-CM | POA: Diagnosis not present

## 2018-02-15 DIAGNOSIS — L988 Other specified disorders of the skin and subcutaneous tissue: Secondary | ICD-10-CM | POA: Diagnosis not present

## 2018-02-15 DIAGNOSIS — E7849 Other hyperlipidemia: Secondary | ICD-10-CM | POA: Diagnosis not present

## 2018-02-20 ENCOUNTER — Ambulatory Visit (INDEPENDENT_AMBULATORY_CARE_PROVIDER_SITE_OTHER): Payer: Medicare Other | Admitting: Podiatry

## 2018-02-20 ENCOUNTER — Encounter: Payer: Self-pay | Admitting: Podiatry

## 2018-02-20 DIAGNOSIS — B351 Tinea unguium: Secondary | ICD-10-CM | POA: Diagnosis not present

## 2018-02-20 DIAGNOSIS — M79676 Pain in unspecified toe(s): Secondary | ICD-10-CM

## 2018-02-20 DIAGNOSIS — E1142 Type 2 diabetes mellitus with diabetic polyneuropathy: Secondary | ICD-10-CM

## 2018-02-20 NOTE — Progress Notes (Signed)
He presents today chief complaint of painful elongated toenails 1 through 5 bilateral.  Objective: Toenails are long thick yellow dystrophic-like mycotic pulses remain palpable.  No open lesions or wounds.  Assessment: Pain in limb secondary to onychomycosis.  Plan: Debridement of toenails 1 through 5 bilateral.

## 2018-03-07 DIAGNOSIS — D510 Vitamin B12 deficiency anemia due to intrinsic factor deficiency: Secondary | ICD-10-CM | POA: Diagnosis not present

## 2018-03-10 DIAGNOSIS — Z23 Encounter for immunization: Secondary | ICD-10-CM | POA: Diagnosis not present

## 2018-03-15 DIAGNOSIS — L820 Inflamed seborrheic keratosis: Secondary | ICD-10-CM | POA: Diagnosis not present

## 2018-03-15 DIAGNOSIS — L218 Other seborrheic dermatitis: Secondary | ICD-10-CM | POA: Diagnosis not present

## 2018-04-04 ENCOUNTER — Emergency Department (HOSPITAL_COMMUNITY): Payer: Medicare Other

## 2018-04-04 ENCOUNTER — Emergency Department (HOSPITAL_COMMUNITY)
Admission: EM | Admit: 2018-04-04 | Discharge: 2018-04-04 | Disposition: A | Discharge status: Home / Self Care | Payer: Medicare Other | Attending: Emergency Medicine | Admitting: Emergency Medicine

## 2018-04-04 ENCOUNTER — Encounter (HOSPITAL_COMMUNITY): Payer: Self-pay | Admitting: Emergency Medicine

## 2018-04-04 DIAGNOSIS — I2510 Atherosclerotic heart disease of native coronary artery without angina pectoris: Secondary | ICD-10-CM | POA: Diagnosis not present

## 2018-04-04 DIAGNOSIS — Z7982 Long term (current) use of aspirin: Secondary | ICD-10-CM | POA: Diagnosis not present

## 2018-04-04 DIAGNOSIS — R0602 Shortness of breath: Secondary | ICD-10-CM | POA: Insufficient documentation

## 2018-04-04 DIAGNOSIS — I252 Old myocardial infarction: Secondary | ICD-10-CM | POA: Insufficient documentation

## 2018-04-04 DIAGNOSIS — Z951 Presence of aortocoronary bypass graft: Secondary | ICD-10-CM | POA: Insufficient documentation

## 2018-04-04 DIAGNOSIS — Z952 Presence of prosthetic heart valve: Secondary | ICD-10-CM | POA: Insufficient documentation

## 2018-04-04 DIAGNOSIS — T63461A Toxic effect of venom of wasps, accidental (unintentional), initial encounter: Secondary | ICD-10-CM | POA: Insufficient documentation

## 2018-04-04 DIAGNOSIS — E119 Type 2 diabetes mellitus without complications: Secondary | ICD-10-CM | POA: Diagnosis not present

## 2018-04-04 DIAGNOSIS — Z79899 Other long term (current) drug therapy: Secondary | ICD-10-CM | POA: Insufficient documentation

## 2018-04-04 DIAGNOSIS — Z794 Long term (current) use of insulin: Secondary | ICD-10-CM | POA: Insufficient documentation

## 2018-04-04 DIAGNOSIS — R079 Chest pain, unspecified: Secondary | ICD-10-CM | POA: Diagnosis not present

## 2018-04-04 DIAGNOSIS — R0789 Other chest pain: Secondary | ICD-10-CM | POA: Diagnosis not present

## 2018-04-04 LAB — POCT I-STAT TROPONIN I
TROPONIN I, POC: 0 ng/mL (ref 0.00–0.08)
Troponin i, poc: 0 ng/mL (ref 0.00–0.08)

## 2018-04-04 LAB — BASIC METABOLIC PANEL
Anion gap: 14 (ref 5–15)
BUN: 19 mg/dL (ref 8–23)
CHLORIDE: 107 mmol/L (ref 98–111)
CO2: 21 mmol/L — AB (ref 22–32)
Calcium: 9.8 mg/dL (ref 8.9–10.3)
Creatinine, Ser: 1.34 mg/dL — ABNORMAL HIGH (ref 0.61–1.24)
GFR calc Af Amer: 59 mL/min — ABNORMAL LOW (ref >60)
GFR calc non Af Amer: 51 mL/min — ABNORMAL LOW (ref >60)
Glucose, Bld: 187 mg/dL — ABNORMAL HIGH (ref 70–99)
POTASSIUM: 4.3 mmol/L (ref 3.5–5.1)
SODIUM: 142 mmol/L (ref 135–145)

## 2018-04-04 LAB — CBC
HEMATOCRIT: 33.4 % — AB (ref 39.0–52.0)
Hemoglobin: 11.2 g/dL — ABNORMAL LOW (ref 13.0–17.0)
MCH: 28.1 pg (ref 26.0–34.0)
MCHC: 33.5 g/dL (ref 30.0–36.0)
MCV: 83.7 fL (ref 78.0–100.0)
Platelets: 186 10*3/uL (ref 150–400)
RBC: 3.99 MIL/uL — AB (ref 4.22–5.81)
RDW: 16.1 % — AB (ref 11.5–15.5)
WBC: 9.1 10*3/uL (ref 4.0–10.5)

## 2018-04-04 MED ORDER — IBUPROFEN 200 MG PO TABS
400.0000 mg | ORAL_TABLET | Freq: Once | ORAL | Status: AC | PRN
  Administered 2018-04-04: 400 mg via ORAL
  Filled 2018-04-04: qty 2

## 2018-04-04 NOTE — ED Triage Notes (Addendum)
Patient here from home with complaints of chest pain. Reports that he was stung by a wasp on the left hand, started having left arm pain radiating up into chest. Denies n/v. Took 2 Nitro with no relief.

## 2018-04-04 NOTE — ED Provider Notes (Signed)
[facility] DEPT Provider Note   CSN: [id number] Arrival date & time: 04/04/18  1523     History   Chief Complaint Chief Complaint  Patient presents with  . Chest Pain    HPI Nathan [name] is a 72 y.o. male.  HPI Patient reports he got stung by a wasp on his left third fingertip.  He reports it with a really bad staying and it hurt a lot.  He reports that started the be painful up through his forearm and arm.  He then got him sharp pains in his chest within about an hour of the sting.  When it started to hurt his chest, he also felt short of breath.  At that time he tried his nitroglycerin with no relief and came to the hospital.  Patient took some Tylenol at home.  Once to the emergency department he was given ibuprofen.  Patient had a very long wait in the waiting room.  He reports that the pain is now eased and resolved all except for his fingertip which is still tender and sensitive.  Patient has not been experiencing any exertional chest pain before this episode.  Patient does have known history of coronary artery disease with prior history of angiography and non-stent double coronary artery disease. Past Medical History:  Diagnosis Date  . Allergy   . Aortic stenosis   . Arthritis   . Blood transfusion without reported diagnosis   . CAD (coronary artery disease)   . Cataract    both eyes surgically removed  . Diabetes mellitus, type 2 ([facility])   . Diverticulosis of colon (without mention of hemorrhage)   . Duodenal ulcer perforation ([place])    blood transfusion  . Flesh-eating bacteria ([place]) 1995  . GERD (gastroesophageal reflux disease)   . Glaucoma    pre glaucoma on Xalatan drops  . Heart murmur   . History of diabetic retinopathy   . History of gastrointestinal bleeding   . History of necrotizing fasciitis   . History of prosthetic aortic valve   . Hypercholesteremia   . Myocardial infarction ([place]) 02/23/2016  . Nightmares    CHRONIC    . OSA (obstructive sleep apnea)    borderline no CPAP   . Personal history of colonic polyps 11/23/2009   TUBULAR ADENOMA  . Postoperative anemia   . Sinus bradycardia   . Sleep apnea    no cpap  . Stomach ulcer    Hx of  . Systolic hypertension   . Vitamin B12 deficiency     Patient Active Problem List   Diagnosis Date Noted  . DM (diabetes mellitus), secondary, with neurologic complications ([place]) 58/03/9832  . Obesity (BMI 30-39.9) 03/15/2016  . Abnormal nuclear stress test 82/50/5397  .  umbilical hernia repair open with Ventralex mesh Oct 2014 04/10/2013  . Other general symptoms  01/06/2011  . Personal history of colonic polyps 01/06/2011  . Iron deficiency anemia 01/06/2011  . Gastritis, chronic 01/06/2011  . Benign hypertensive heart disease without heart failure 10/13/2010  . Hx of CABG 10/13/2010  . Systolic hypertension   . Sinus bradycardia   . CAD (coronary artery disease)   . History of prosthetic aortic valve   . History of diabetic retinopathy   . History of necrotizing fasciitis   . Postoperative anemia   . Nightmares   . B12 DEFICIENCY 12/02/2009  . ESOPHAGEAL REFLUX 11/19/2009  . CHEST PAIN 11/19/2009  . BLOOD IN STOOL, OCCULT 11/19/2009  . ANEMIA-IRON  DEFICIENCY 08/19/2008  . S/P aortic valve replacement with bioprosthetic valve 08/19/2008  . GI BLEED 08/19/2008  . DIABETES MELLITUS, TYPE II, MILD 08/14/2008  . Dyslipidemia 08/14/2008  . ANXIETY 08/14/2008  . OBSTRUCTIVE SLEEP APNEA 08/14/2008  . GLAUCOMA 08/14/2008  . Essential hypertension 08/14/2008  . PHARYNGITIS 08/14/2008  . GASTRIC ULCER 08/14/2008  . GASTRITIS 08/14/2008  . DIVERTICULOSIS, COLON 08/14/2008    Past Surgical History:  Procedure Laterality Date  . AORTIC VALVE REPLACEMENT    . BLEPHAROPLASTY  11-24-14  . CARDIAC CATHETERIZATION N/A 03/01/2016   Procedure: Coronary/Graft Angiography;  Surgeon: Peter M Martinique, MD;  Location: [facility];  Service: Cardiovascular;   Laterality: N/A;  . CATARACT EXTRACTION W/ INTRAOCULAR LENS IMPLANT     OD  . COLONOSCOPY    . CORONARY ARTERY BYPASS GRAFT  2010   x 4  . flesh eating disease surgery      surgeries x 7   . [place]  . POLYPECTOMY    . RETINAL LASER PROCEDURE    . [place]   resection  . UMBILICAL HERNIA REPAIR N/A 04/10/2013   Procedure: HERNIA REPAIR UMBILICAL ADULT with mesh;  Surgeon: Pedro Earls, MD;  Location: [facility];  Service: General;  Laterality: N/A;        Home Medications    Prior to Admission medications   Medication Sig Start Date End Date Taking? Authorizing Provider  allopurinol (ZYLOPRIM) 300 MG tablet Take 300 mg by mouth daily.    [provider]  ALPRAZolam Duanne Moron) 0.25 MG tablet Take 0.25 mg by mouth daily as needed for anxiety.     [provider]  amLODipine (NORVASC) 5 MG tablet Take 5 mg by mouth every morning.     [provider]  Ascorbic Acid (VITAMIN C) 1000 MG tablet Take 1,000 mg by mouth daily.    [provider]  aspirin 81 MG tablet Take 1 tablet (81 mg total) by mouth daily. 02/09/16   Jerline Pain, MD  atorvastatin (LIPITOR) 20 MG tablet Take 20 mg by mouth daily. 07/25/15   [provider]  B-D ULTRAFINE III SHORT PEN 31G X 8 MM MISC Inject 1 application into the skin daily. 03/28/17   [provider]  BAYER CONTOUR TEST test strip USE 1 STRIP THREE TIMES DAILY TO CHECK BLOOD SUGARS. 11/17/15   [provider]  Cholecalciferol (VITAMIN D3) 1000 UNITS capsule Take 10,000 Units by mouth daily.     [provider]  co-enzyme Q-10 30 MG capsule Take 200 mg by mouth 2 (two) times daily.     [provider]  Cyanocobalamin (VITAMIN B-12 IJ) Inject 1,000 mcg into the muscle every 30 (thirty) days. Amt/dose is unknown    [provider]  cycloSPORINE (RESTASIS) 0.05 % ophthalmic emulsion Place 1 drop into both eyes 2 (two) times daily.    [provider]  fish oil-omega-3 fatty acids 1000 MG capsule Take 2 g by mouth daily.     [provider]  glipiZIDE (GLUCOTROL XL) 5 MG 24 hr tablet Take 5 mg by mouth 2 (two) times daily after a meal. 02/13/17   [provider]  glipiZIDE (GLUCOTROL) 5 MG tablet Take 5 mg by mouth every morning.     [provider]  hydrochlorothiazide (MICROZIDE) 12.5 MG capsule Take 12.5 mg by mouth daily. Reported on 08/20/2015 10/13/10   Darlin Coco, MD  HYDROcodone-acetaminophen (VICODIN) 5-500 MG per  tablet Take 1 tablet by mouth daily as needed for pain.     [provider]  isosorbide mononitrate (IMDUR) 30 MG 24 hr tablet Take 1 tablet (30 mg total) by mouth daily. Please make yearly appt with Dr. Marlou Porch for December for future refills. 1st attempt 01/10/18   Jerline Pain, MD  latanoprost (XALATAN) 0.005 % ophthalmic solution Place 1 drop into both eyes at bedtime.      [provider]  LEVEMIR FLEXTOUCH 100 UNIT/ML Pen Inject 18 Units into the skin daily. 05/29/16   [provider]  losartan (COZAAR) 50 MG tablet Take 1 tablet (50 mg total) by mouth daily. 09/30/14   Darlin Coco, MD  metFORMIN (GLUCOPHAGE) 1000 MG tablet Take 1,000 mg by mouth daily with breakfast.    [provider]  metoprolol (LOPRESSOR) 50 MG tablet Take 50 mg by mouth 2 (two) times daily.     [provider]  minoxidil (LONITEN) 10 MG tablet Take 10 mg by mouth daily.  07/20/15   [provider]  Multiple Vitamin (MULTIVITAMIN) tablet Take 1 tablet by mouth daily.      [provider]  mupirocin ointment (BACTROBAN) 2 % Apply to wound twice a day. 09/19/17   Hyatt, Max T, DPM  nitroGLYCERIN (NITROSTAT) 0.4 MG SL tablet Place 1 tablet under the tongue as needed. 09/01/16   [provider]  omeprazole (PRILOSEC) 40 MG capsule Take 40 mg by mouth 2 (two) times daily. 11/12/15   [provider]  oxyCODONE-acetaminophen  (PERCOCET/ROXICET) 5-325 MG tablet Take 1 tablet by mouth daily as needed for moderate pain or severe pain.     [provider]  oxymetazoline (AFRIN) 0.05 % nasal spray Place 2 sprays into the nose 2 (two) times daily as needed for congestion. Reported on 08/20/2015    [provider]    Family History Family History  Problem Relation Age of Onset  . Cirrhosis Father   . Alcohol abuse Father        father murdered  . Rectal cancer Mother   . Diabetes Mother   . Colon cancer Mother 39       rectal cancer  . Hypertension Mother   . Hypertension Sister   . Diabetes Sister   . Heart attack Neg Hx   . Stroke Neg Hx   . Colon polyps Neg Hx   . Esophageal cancer Neg Hx   . Stomach cancer Neg Hx     Social History Social History   Tobacco Use  . Smoking status: Never Smoker  . Smokeless tobacco: Never Used  Substance Use Topics  . Alcohol use: No    Alcohol/week: 0.0 standard drinks  . Drug use: No     Allergies   Shellfish-derived products and Zolpidem tartrate   Review of Systems Review of Systems 10 Systems reviewed and are negative for acute change except as noted in the HPI.   Physical Exam Updated Vital Signs BP (!) 154/84 (BP Location: Left Arm)   Pulse 72   Resp 18   SpO2 99%   Physical Exam  Constitutional: He is oriented to person, place, and time.  Patient alert and nontoxic.  No respiratory distress.  Central obesity.  HENT:  Head: Normocephalic and atraumatic.  Mouth/Throat: Oropharynx is clear and moist.  Eyes: EOM are normal.  Cardiovascular: Normal rate.  2\6 systolic ejection murmur.  Regular.  No rub no gallop.  Pulmonary/Chest: Effort normal and breath sounds normal.  Abdominal: Soft. He exhibits no distension. There is no tenderness. There is no guarding.  Musculoskeletal: Normal range of motion.  The pad of the patient's left third fingertip is mildly swollen.  No edema of the entirety of the finger and no red streaking up  the hand.  Calves are soft and nontender.  No peripheral edema.  Neurological: He is alert and oriented to person, place, and time. No cranial nerve deficit. He exhibits normal muscle tone. Coordination normal.  Skin: Skin is warm and dry.  Psychiatric: He has a normal mood and affect.     ED Treatments / Results  Labs (all labs ordered are listed, but only abnormal results are displayed) Labs Reviewed  BASIC METABOLIC PANEL - Abnormal; Notable for the following components:      Result Value   CO2 21 (*)    Glucose, Bld 187 (*)    Creatinine, Ser 1.34 (*)    GFR calc non Af Amer 51 (*)    GFR calc Af Amer 59 (*)    All other components within normal limits  CBC - Abnormal; Notable for the following components:   RBC 3.99 (*)    Hemoglobin 11.2 (*)    HCT 33.4 (*)    RDW 16.1 (*)    All other components within normal limits  I-STAT TROPONIN, ED  POCT I-STAT TROPONIN I  I-STAT TROPONIN, ED    EKG EKG Interpretation  Date/Time:  Wednesday April 04 2018 15:27:48 EDT Ventricular Rate:  82 PR Interval:    QRS Duration: 96 QT Interval:  371 QTC Calculation: 434 R Axis:   -17 Text Interpretation:  Sinus arrhythmia Borderline left axis deviation Probable anteroseptal infarct, old no stemi, NO SIG CHANGE FROM PREVIOUS Confirmed by Charlesetta Shanks [phone]) on 04/04/2018 9:54:18 PM   Radiology Dg Chest 2 View  Result Date: 04/04/2018 CLINICAL DATA:  Chest pain EXAM: CHEST - 2 VIEW COMPARISON:  04/03/2013 FINDINGS: Stable cardiomegaly with low lung volumes and basilar atelectasis. No current CHF, significant pneumonia, collapse or consolidation. No effusion or pneumothorax. Trachea is midline. Previous coronary bypass changes noted. Aorta is atherosclerotic and tortuous. Degenerative changes of the spine with increased thoracic kyphosis. Aortic valve replacement noted on the lateral view. IMPRESSION: Stable cardiomegaly and postoperative findings. Basilar atelectasis. No interval  change or superimposed acute process Electronically Signed   By: Jerilynn Mages.  Shick M.D.   On: 04/04/2018 16:48    Procedures Procedures (including critical care time)  Medications Ordered in ED Medications  ibuprofen (ADVIL,MOTRIN) tablet 400 mg (400 mg Oral Given 04/04/18 1632)     Initial Impression / Assessment and Plan / ED Course  I have reviewed the triage vital signs and the nursing notes.  Pertinent labs & imaging results that were available during my care of the patient were reviewed by me and considered in my medical decision making (see chart for details).    Patient is clinically well in appearance.  He got a wasp sting on the left finger pad.  He reports it was intensely painful and the pain migrated up his arm and then he developed chest pain.  At this time, I believe his chest pain was due to reaction to the sting.  He did not have any anaphylaxis.  Pain resolved after he had taken ibuprofen and Tylenol.  2 sets of cardiac enzymes are negative.  Return precautions reviewed.  Patient stable for discharge.   Final Clinical Impressions(s) / ED Diagnoses   Final diagnoses:  Other  chest pain  Wasp sting, accidental or unintentional, initial encounter    ED Discharge Orders    None       Charlesetta Shanks, MD 04/04/18 2303

## 2018-04-09 DIAGNOSIS — I2510 Atherosclerotic heart disease of native coronary artery without angina pectoris: Secondary | ICD-10-CM | POA: Diagnosis not present

## 2018-04-09 DIAGNOSIS — E1139 Type 2 diabetes mellitus with other diabetic ophthalmic complication: Secondary | ICD-10-CM | POA: Diagnosis not present

## 2018-04-09 DIAGNOSIS — D649 Anemia, unspecified: Secondary | ICD-10-CM | POA: Diagnosis not present

## 2018-04-09 DIAGNOSIS — Z794 Long term (current) use of insulin: Secondary | ICD-10-CM | POA: Diagnosis not present

## 2018-04-09 DIAGNOSIS — I10 Essential (primary) hypertension: Secondary | ICD-10-CM | POA: Diagnosis not present

## 2018-04-09 DIAGNOSIS — D510 Vitamin B12 deficiency anemia due to intrinsic factor deficiency: Secondary | ICD-10-CM | POA: Diagnosis not present

## 2018-04-09 DIAGNOSIS — D6489 Other specified anemias: Secondary | ICD-10-CM | POA: Diagnosis not present

## 2018-04-09 DIAGNOSIS — Z6830 Body mass index (BMI) 30.0-30.9, adult: Secondary | ICD-10-CM | POA: Diagnosis not present

## 2018-04-30 IMAGING — NM NM MISC PROCEDURE
6 series · 36 of 36 positions shown · non-contrast
Comparison: none

[Series 1: wbr_r-proj_st rest · 6.51mm/px · 6 of 64 frames shown]
[frame 6/64]
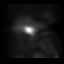
[frame 16/64]
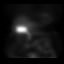
[frame 27/64]
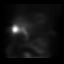
[frame 38/64]
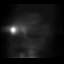
[frame 48/64]
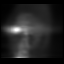
[frame 59/64]
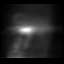

[Series 1: rest · 6.51mm/px · 6 of 64 frames shown]
[frame 6/64]
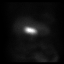
[frame 16/64]
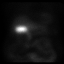
[frame 27/64]
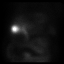
[frame 38/64]
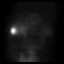
[frame 48/64]
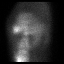
[frame 59/64]
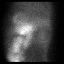

[Series 3: wbr_s-proj_st stress · 6.51mm/px · 6 of 512 frames shown (1 of 2)]
[frame 43/512]
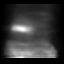
[frame 128/512]
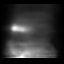
[frame 214/512]
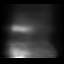
[frame 299/512]
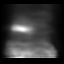
[frame 384/512]
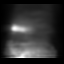
[frame 470/512]
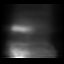

[Series 3: wbr_s-proj_st stress · 6.51mm/px · 6 of 64 frames shown (2 of 2)]
[frame 6/64]
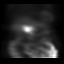
[frame 16/64]
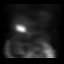
[frame 27/64]
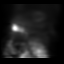
[frame 38/64]
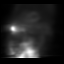
[frame 48/64]
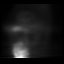
[frame 59/64]
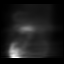

[Series 3: stress · 6.51mm/px · 6 of 512 frames shown (1 of 2)]
[frame 43/512]
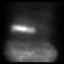
[frame 128/512]
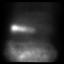
[frame 214/512]
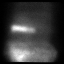
[frame 299/512]
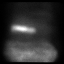
[frame 384/512]
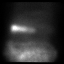
[frame 470/512]
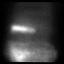

[Series 3: stress · 6.51mm/px · 6 of 64 frames shown (2 of 2)]
[frame 6/64]
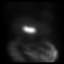
[frame 16/64]
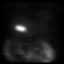
[frame 27/64]
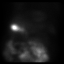
[frame 38/64]
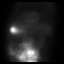
[frame 48/64]
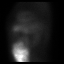
[frame 59/64]
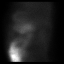

[36 of 36 positions shown; findings below may reference images not displayed]

Canned report from images found in remote index.

Refer to host system for actual result text.

## 2018-05-16 DIAGNOSIS — D510 Vitamin B12 deficiency anemia due to intrinsic factor deficiency: Secondary | ICD-10-CM | POA: Diagnosis not present

## 2018-05-22 ENCOUNTER — Encounter: Payer: Self-pay | Admitting: Podiatry

## 2018-05-22 ENCOUNTER — Ambulatory Visit (INDEPENDENT_AMBULATORY_CARE_PROVIDER_SITE_OTHER): Payer: Medicare Other | Admitting: Podiatry

## 2018-05-22 DIAGNOSIS — S90811A Abrasion, right foot, initial encounter: Secondary | ICD-10-CM

## 2018-05-22 DIAGNOSIS — M79675 Pain in left toe(s): Secondary | ICD-10-CM

## 2018-05-22 DIAGNOSIS — M79674 Pain in right toe(s): Secondary | ICD-10-CM

## 2018-05-22 DIAGNOSIS — B351 Tinea unguium: Secondary | ICD-10-CM

## 2018-05-22 DIAGNOSIS — E1142 Type 2 diabetes mellitus with diabetic polyneuropathy: Secondary | ICD-10-CM

## 2018-05-22 NOTE — Patient Instructions (Addendum)
FOR ABRASION RIGHT FOOT: APPLY ANTIBIOTIC CREAM/OINTMENT ONCE DAILY UNTIL HEALED COVER AREA WHEN WEARING SOCKS/SHOES     Diabetes and Foot Care Diabetes may cause you to have problems because of poor blood supply (circulation) to your feet and legs. This may cause the skin on your feet to become thinner, break easier, and heal more slowly. Your skin may become dry, and the skin may peel and crack. You may also have nerve damage in your legs and feet causing decreased feeling in them. You may not notice minor injuries to your feet that could lead to infections or more serious problems. Taking care of your feet is one of the most important things you can do for yourself. Follow these instructions at home:  Wear shoes at all times, even in the house. Do not go barefoot. Bare feet are easily injured.  Check your feet daily for blisters, cuts, and redness. If you cannot see the bottom of your feet, use a mirror or ask someone for help.  Wash your feet with warm water (do not use hot water) and mild soap. Then pat your feet and the areas between your toes until they are completely dry. Do not soak your feet as this can dry your skin.  Apply a moisturizing lotion or petroleum jelly (that does not contain alcohol and is unscented) to the skin on your feet and to dry, brittle toenails. Do not apply lotion between your toes.  Trim your toenails straight across. Do not dig under them or around the cuticle. File the edges of your nails with an emery board or nail file.  Do not cut corns or calluses or try to remove them with medicine.  Wear clean socks or stockings every day. Make sure they are not too tight. Do not wear knee-high stockings since they may decrease blood flow to your legs.  Wear shoes that fit properly and have enough cushioning. To break in new shoes, wear them for just a few hours a day. This prevents you from injuring your feet. Always look in your shoes before you put them on to be  sure there are no objects inside.  Do not cross your legs. This may decrease the blood flow to your feet.  If you find a minor scrape, cut, or break in the skin on your feet, keep it and the skin around it clean and dry. These areas may be cleansed with mild soap and water. Do not cleanse the area with peroxide, alcohol, or iodine.  When you remove an adhesive bandage, be sure not to damage the skin around it.  If you have a wound, look at it several times a day to make sure it is healing.  Do not use heating pads or hot water bottles. They may burn your skin. If you have lost feeling in your feet or legs, you may not know it is happening until it is too late.  Make sure your health care provider performs a complete foot exam at least annually or more often if you have foot problems. Report any cuts, sores, or bruises to your health care provider immediately. Contact a health care provider if:  You have an injury that is not healing.  You have cuts or breaks in the skin.  You have an ingrown nail.  You notice redness on your legs or feet.  You feel burning or tingling in your legs or feet.  You have pain or cramps in your legs and feet.  Your  legs or feet are numb.  Your feet always feel cold. Get help right away if:  There is increasing redness, swelling, or pain in or around a wound.  There is a red line that goes up your leg.  Pus is coming from a wound.  You develop a fever or as directed by your health care provider.  You notice a bad smell coming from an ulcer or wound. This information is not intended to replace advice given to you by your health care provider. Make sure you discuss any questions you have with your health care provider. Document Released: 06/24/2000 Document Revised: 12/03/2015 Document Reviewed: 12/04/2012 Elsevier Interactive Patient Education  2017 [employer].

## 2018-05-22 NOTE — Progress Notes (Signed)
Subjective: Nathan [name] presents today with history of diabetic neuropathy for follow-up.  He is seen today for painful elongated toenails of both feet.  His pain is aggravated when wearing enclosed shoe gear and is relieved with periodic professional debridement.  Today Nathan [name] relates an episode a few weeks ago in which he scraped the top of his right foot on a piece of linoleum flooring which was sticking up.  His foot went under the linoleum and he sustained an abrasion on the top of his right foot.  He relates he has been putting antibiotic ointment on it every day.  Most of the abrasion has healed he now has one spot that has a scab on it.  He denies any drainage or significant pain.  He says it is healing.  Objective:  Vascular Examination: Capillary refill time immediate to all 10 digits. Dorsalis pedis pulses are palpable bilaterally Posterior tibial pulses are faintly palpable bilaterally Digital hair x 10 digits was sparse Skin temperature gradient is within normal limits bilaterally  Dermatological Examination: Skin with mild atrophy noted bilaterally. Toenails 1-5 b/l discolored, thick, dystrophic with subungual debris and pain with palpation to nailbeds due to thickness of nails.  Noted on the dorsum of his right foot is a healing abrasion.  The area appears to be somewhat raised.  It is annular in shape.  There is a yellow scab noted in the center.  There is no flocculence or fluid expressed from this area.  There is no surrounding erythema edema or drainage noted.  Musculoskeletal: Muscle strength 5 out of 5 to all muscle groups of the lower extremity bilaterally  Neurological: Sensation with 10 gram monofilament is diminished bilaterally Vibratory sensation is diminished bilaterally  Assessment: 1. Abrasion dorsal aspect of the right foot, noninfected and healing 2. Painful onychomycosis toenails 1-5 b/l 3. Diabetes with peripheral neuropathy  Plan: 1.   Discussed with Mr. Reich his abrasion on the right foot is healing.  He is to apply antibiotic ointment or cream once daily to the area until it is completely healed.  He is to cover the area with a Band-Aid when wearing socks and shoes.  He was given written instructions on today's visit.  He is to call the office should he notice any increased pain, redness, swelling or drainage from the area.  He related understanding. 2.  Toenails 1-5 b/l were debrided in length and girth without iatrogenic bleeding. 3. Patient to continue soft, supportive shoe gear 4. Patient to report any pedal injuries to medical professional  5. Follow up 3 months. Patient/POA to call should there be a concern in the interim.

## 2018-05-24 ENCOUNTER — Encounter: Payer: Self-pay | Admitting: Cardiology

## 2018-06-01 DIAGNOSIS — Z6830 Body mass index (BMI) 30.0-30.9, adult: Secondary | ICD-10-CM | POA: Diagnosis not present

## 2018-06-01 DIAGNOSIS — Z794 Long term (current) use of insulin: Secondary | ICD-10-CM | POA: Diagnosis not present

## 2018-06-01 DIAGNOSIS — G4733 Obstructive sleep apnea (adult) (pediatric): Secondary | ICD-10-CM | POA: Diagnosis not present

## 2018-06-01 DIAGNOSIS — E7849 Other hyperlipidemia: Secondary | ICD-10-CM | POA: Diagnosis not present

## 2018-06-01 DIAGNOSIS — I2510 Atherosclerotic heart disease of native coronary artery without angina pectoris: Secondary | ICD-10-CM | POA: Diagnosis not present

## 2018-06-01 DIAGNOSIS — E1139 Type 2 diabetes mellitus with other diabetic ophthalmic complication: Secondary | ICD-10-CM | POA: Diagnosis not present

## 2018-06-01 DIAGNOSIS — I10 Essential (primary) hypertension: Secondary | ICD-10-CM | POA: Diagnosis not present

## 2018-06-21 ENCOUNTER — Encounter: Payer: Self-pay | Admitting: Cardiology

## 2018-06-21 ENCOUNTER — Ambulatory Visit (INDEPENDENT_AMBULATORY_CARE_PROVIDER_SITE_OTHER): Payer: Medicare Other | Admitting: Cardiology

## 2018-06-21 VITALS — BP 112/60 | HR 56 | Ht 67.0 in | Wt 188.2 lb

## 2018-06-21 DIAGNOSIS — I25810 Atherosclerosis of coronary artery bypass graft(s) without angina pectoris: Secondary | ICD-10-CM

## 2018-06-21 DIAGNOSIS — E785 Hyperlipidemia, unspecified: Secondary | ICD-10-CM

## 2018-06-21 DIAGNOSIS — Z951 Presence of aortocoronary bypass graft: Secondary | ICD-10-CM

## 2018-06-21 DIAGNOSIS — Z952 Presence of prosthetic heart valve: Secondary | ICD-10-CM

## 2018-06-21 DIAGNOSIS — E1169 Type 2 diabetes mellitus with other specified complication: Secondary | ICD-10-CM

## 2018-06-21 DIAGNOSIS — I25731 Atherosclerosis of nonautologous biological coronary artery bypass graft(s) with angina pectoris with documented spasm: Secondary | ICD-10-CM

## 2018-06-21 NOTE — Progress Notes (Signed)
Cardiology Office Note:    Date:  06/21/2018   ID:  Nathan [name], DOB 1945/10/07, MRN [id number]  PCP:  Leanna Battles, MD  Cardiologist:  Candee Furbish, MD  Electrophysiologist:  None   Referring MD: Leanna Battles, MD     History of Present Illness:    Nathan [name] is a 72 y.o. male here for follow-up of chest pain.  Was in the emergency room 04/04/2018 with chest discomfort.  He was in the ER on 04/04/2018 after being stung by a wasp on his fingertip.  Hurt a lot.  He started to feel chest discomfort and shortness of breath.  Nitroglycerin no relief.  Ibuprofen helped ease the pain.  Former patient of Dr. Sherryl Barters, had CABG and aortic valve replacement for severe aortic stenosis in April 2010.  Tissue valve.  Underwent cardiac catheterization 03/01/2016, SVG to RCA was the only occluded graft.  Medical management.  He had a nuclear stress test preceding the cath that showed anterolateral defect.  Enjoyed using the isosorbide gave him more energy.  Medications reviewed compliant.  He recounted previously story 1994 when he had a "flesh eating virus "Dr. Risa Grill help save his life.  Previously stationed in [place] 1968.  He is quite claustrophobic.  Enjoyed the [employer] on country music.  He was friends with Debbe Odea.    Past Medical History:  Diagnosis Date  . Allergy   . Aortic stenosis   . Arthritis   . Blood transfusion without reported diagnosis   . CAD (coronary artery disease)   . Cataract    both eyes surgically removed  . Diabetes mellitus, type 2 ([place])   . Diverticulosis of colon (without mention of hemorrhage)   . Duodenal ulcer perforation ([place])    blood transfusion  . 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 GERD (gastroesophageal reflux disease)   . Glaucoma    pre glaucoma on Xalatan drops  . Heart murmur   . History of diabetic retinopathy   . History of gastrointestinal bleeding   . History of necrotizing fasciitis   .  History of prosthetic aortic valve   . Hypercholesteremia   . Myocardial infarction ([place]) 02/23/2016  . Nightmares    CHRONIC  . OSA (obstructive sleep apnea)    borderline no CPAP   . Personal history of colonic polyps 11/23/2009   TUBULAR ADENOMA  . Postoperative anemia   . Sinus bradycardia   . Sleep apnea    no cpap  . Stomach ulcer    Hx of  . Systolic hypertension   . Vitamin B12 deficiency     Past Surgical History:  Procedure Laterality Date  . AORTIC VALVE REPLACEMENT    . BLEPHAROPLASTY  11-24-14  . CARDIAC CATHETERIZATION N/A 03/01/2016   Procedure: Coronary/Graft Angiography;  Surgeon: Peter M Martinique, MD;  Location: [facility];  Service: Cardiovascular;  Laterality: N/A;  . CATARACT EXTRACTION W/ INTRAOCULAR LENS IMPLANT     OD  . COLONOSCOPY    . CORONARY ARTERY BYPASS GRAFT  2010   x 4  . flesh eating disease surgery      surgeries x 7   . [place]  . POLYPECTOMY    . RETINAL LASER PROCEDURE    . [place]   resection  . UMBILICAL HERNIA REPAIR N/A 04/10/2013   Procedure: HERNIA REPAIR UMBILICAL ADULT with mesh;  Surgeon: Pedro Earls, MD;  Location: [facility];  Service: General;  Laterality:  N/A;    Current Medications: Current Meds  Medication Sig  . acetaminophen (TYLENOL) 500 MG tablet Take 500 mg by mouth daily as needed for moderate pain.  Marland Kitchen allopurinol (ZYLOPRIM) 300 MG tablet Take 300 mg by mouth daily.  Marland Kitchen ALPRAZolam (XANAX) 0.25 MG tablet Take 0.25 mg by mouth daily as needed for anxiety.   Marland Kitchen amLODipine (NORVASC) 5 MG tablet Take 5 mg by mouth every morning.   . Ascorbic Acid (VITAMIN C) 1000 MG tablet Take 1,000 mg by mouth daily.  Marland Kitchen aspirin 81 MG tablet Take 1 tablet (81 mg total) by mouth daily.  Marland Kitchen atorvastatin (LIPITOR) 20 MG tablet Take 20 mg by mouth daily.  . B-D ULTRAFINE III SHORT PEN 31G X 8 MM MISC Inject 1 application into the skin daily.  Marland Kitchen BAYER CONTOUR TEST test strip USE 1 STRIP THREE TIMES DAILY TO  CHECK BLOOD SUGARS.  Marland Kitchen Cholecalciferol (VITAMIN D3) 1000 UNITS capsule Take 10,000 Units by mouth daily.   Marland Kitchen co-enzyme Q-10 30 MG capsule Take 200 mg by mouth 2 (two) times daily.   . Cyanocobalamin (VITAMIN B-12 IJ) Inject 1,000 mcg into the muscle every 30 (thirty) days. Amt/dose is unknown  . cycloSPORINE (RESTASIS) 0.05 % ophthalmic emulsion Place 1 drop into both eyes 2 (two) times daily.  . fish oil-omega-3 fatty acids 1000 MG capsule Take 2 g by mouth daily.   Marland Kitchen glipiZIDE (GLUCOTROL) 5 MG tablet Take 5 mg by mouth every morning.   . hydrochlorothiazide (MICROZIDE) 12.5 MG capsule Take 12.5 mg by mouth daily. Reported on 08/20/2015  . HYDROcodone-acetaminophen (VICODIN) 5-500 MG per tablet Take 1 tablet by mouth daily as needed for pain.   . isosorbide mononitrate (IMDUR) 30 MG 24 hr tablet Take 1 tablet (30 mg total) by mouth daily. Please make yearly appt with Dr. Marlou Porch for December for future refills. 1st attempt  . latanoprost (XALATAN) 0.005 % ophthalmic solution Place 1 drop into both eyes at bedtime.    Marland Kitchen LEVEMIR FLEXTOUCH 100 UNIT/ML Pen Inject 20 Units into the skin daily.   Marland Kitchen losartan (COZAAR) 50 MG tablet Take 1 tablet (50 mg total) by mouth daily.  . metFORMIN (GLUCOPHAGE) 1000 MG tablet Take 1,000 mg by mouth daily with breakfast.  . metoprolol (LOPRESSOR) 50 MG tablet Take 50 mg by mouth 2 (two) times daily.   . minoxidil (LONITEN) 10 MG tablet Take 10 mg by mouth daily.   . Multiple Vitamin (MULTIVITAMIN) tablet Take 1 tablet by mouth daily.    . mupirocin ointment (BACTROBAN) 2 % Apply to wound twice a day.  . nitroGLYCERIN (NITROSTAT) 0.4 MG SL tablet Place 1 tablet under the tongue as needed.  Marland Kitchen omeprazole (PRILOSEC) 40 MG capsule Take 40 mg by mouth 2 (two) times daily.  Marland Kitchen oxyCODONE-acetaminophen (PERCOCET/ROXICET) 5-325 MG tablet Take 1 tablet by mouth daily as needed for moderate pain or severe pain.   Marland Kitchen oxymetazoline (AFRIN) 0.05 % nasal spray Place 2 sprays into the  nose 2 (two) times daily as needed for congestion. Reported on 08/20/2015  . trolamine salicylate (ASPERCREME) 10 % cream Apply 1 application topically as needed for muscle pain.     Allergies:   Shellfish-derived products and Zolpidem tartrate   Social History   Socioeconomic History  . Marital status: Married    Spouse name: Not on file  . Number of children: 0  . Years of education: Not on file  . Highest education level: Not on file  Occupational History  .  Occupation: Scientist, clinical (histocompatibility and immunogenetics): [employer]  . Financial resource strain: Not on file  . Food insecurity:    Worry: Not on file    Inability: Not on file  . Transportation needs:    Medical: Not on file    Non-medical: Not on file  Tobacco Use  . Smoking status: Never Smoker  . Smokeless tobacco: Never Used  Substance and Sexual Activity  . Alcohol use: No    Alcohol/week: 0.0 standard drinks  . Drug use: No  . Sexual activity: Not on file  Lifestyle  . Physical activity:    Days per week: Not on file    Minutes per session: Not on file  . Stress: Not on file  Relationships  . Social connections:    Talks on phone: Not on file    Gets together: Not on file    Attends religious service: Not on file    Active member of club or organization: Not on file    Attends meetings of clubs or organizations: Not on file    Relationship status: Not on file  Other Topics Concern  . Not on file  Social History Narrative  . Not on file     Family History: The patient's family history includes Alcohol abuse in his father; Cirrhosis in his father; Colon cancer (age of onset: 34) in his mother; Diabetes in his mother and sister; Hypertension in his mother and sister; Rectal cancer in his mother. There is no history of Heart attack, Stroke, Colon polyps, Esophageal cancer, or Stomach cancer.  ROS:   Please see the history of present illness.    No fevers chills nausea vomiting syncope bleeding.  All other  systems reviewed and are negative.  EKGs/Labs/Other Studies Reviewed:    The following studies were reviewed today: Prior office notes EKG catheterization  ECHO 11/09/16  - Left ventricle: The cavity size was normal. Wall thickness was increased in a pattern of mild LVH. Systolic function was normal. The estimated ejection fraction was in the range of 55% to 60%. Wall motion was normal; there were no regional wall motion abnormalities. Features are consistent with a pseudonormal left ventricular filling pattern, with concomitant abnormal relaxation and increased filling pressure (grade 2 diastolic dysfunction). - Aortic valve: A bioprosthesis was present. - Ascending aorta: The ascending aorta was mildly dilated. - Mitral valve: Calcified annulus. - Left atrium: The atrium was mildly dilated. - Pulmonary arteries: Systolic pressure was mildly increased. PA peak pressure: 32 mm Hg (S).  Impressions:  - Normal LV systolic function; moderate diastolic dysfunction; s/p AVR with mildly elevated mean gradient (19 mmHg); no AI; mildly dilated ascending aorta; mild LAE; trace TR with mildly elevated pulmonary pressure.   CABG op report 10/20/08: Coronary artery bypass grafting x3 with the left internal mammary to the left anterior descending coronary artery,reverse saphenous vein graft to circumflex coronary artery, reversesaphenous vein graft to the posterior descending coronary artery and aortic valve replacement with a pericardial tissue valve. [employer], [name], 23-mm, serial number [id number], withbilateral thigh endovein harvesting.  Cath 03/01/16:  Mid RCA lesion, 50 %stenosed.  Mid RCA to Dist RCA lesion, 100 %stenosed.  Prox Cx to Mid Cx lesion, 99 %stenosed.  Prox LAD to Mid LAD lesion, 100 %stenosed.  SVG to RCA is occluded  Origin lesion, 100 %stenosed.  SVG to OM2 is widely patent.  LIMA and is normal in caliber and  anatomically normal.  1. Severe 3 vessel  occlusive CAD 2. Patent LIMA to the LAD 3. Patent SVG to large OM2 4. Occluded SVG to PDA. Distal RCA is occluded and fills by left to right collaterals.   Plan: recommend medical management.    EKG:  EKG is  ordered today.  The ekg ordered today demonstrates 06/21/2018-sinus bradycardia 56 septal infarct pattern no significant change.  Personally reviewed and interpreted.  11/07/16-sinus rhythm septal infarct pattern, nonspecific T-wave changes, no other abnormalities, poor R-wave progression noted. The ekg ordered today demonstrates 02/09/16-sinus rhythm, 63, no other abnormalities.  Recent Labs: 04/04/2018: BUN 19; Creatinine, Ser 1.34; Hemoglobin 11.2; Platelets 186; Potassium 4.3; Sodium 142  Recent Lipid Panel No results found for: CHOL, TRIG, HDL, CHOLHDL, VLDL, LDLCALC, LDLDIRECT  Physical Exam:    VS:  BP 112/60   Pulse (!) 56   Ht 5\' 7"  (1.702 m)   Wt 188 lb 3.2 oz (85.4 kg)   BMI 29.48 kg/m     Wt Readings from Last 3 Encounters:  06/21/18 188 lb 3.2 oz (85.4 kg)  06/21/17 190 lb 12.8 oz (86.5 kg)  06/13/17 196 lb (88.9 kg)     GEN: overweight Well nourished, well developed in no acute distress HEENT: Normal NECK: No JVD; No carotid bruits LYMPHATICS: No lymphadenopathy CARDIAC: RRR, 2/6 SM (bioprosthetic aortic valve), no rubs, gallops RESPIRATORY:  Clear to auscultation without rales, wheezing or rhonchi  ABDOMEN: Soft, non-tender, non-distended MUSCULOSKELETAL:  No edema; No deformity  SKIN: Warm and dry NEUROLOGIC:  Alert and oriented x 3 PSYCHIATRIC:  Normal affect   ASSESSMENT:    1. Coronary artery disease involving autologous vein coronary bypass graft without angina pectoris   2. Hx of CABG   3. S/P AVR (aortic valve replacement)   4. Type 2 diabetes mellitus with hyperlipidemia (HCC)    PLAN:    In order of problems listed above:  Coronary artery disease CABG prior chest pain - Continuing with  isosorbide, medical management.  See cardiac catheterization as above. -Aspirin 81, statin, good job with weight loss.  Aortic valve replacement - Bioprosthetic valve was functioning normally in 2018 on echocardiogram.  Dental prophylaxis.  Palpitations -Previously likely described as PVCs or PACs.  No evidence of atrial fibrillation.  History of GI bleed - Gastric ulcer hemorrhage 2010  Diabetes with hyperlipidemia obesity and obstructive sleep apnea - Continue with current plan.  No changes.  Has been monitored by Dr. Sharlett Iles. -LDL 55 07/18/2017.  Creatinine 1.3 hemoglobin 11   Medication Adjustments/Labs and Tests Ordered: Current medicines are reviewed at length with the patient today.  Concerns regarding medicines are outlined above.  Orders Placed This Encounter  Procedures  . EKG 12-Lead   No orders of the defined types were placed in this encounter.   Patient Instructions  Medication Instructions:   Your physician recommends that you continue on your current medications as directed. Please refer to the Current Medication list given to you today.  If you need a refill on your cardiac medications before your next appointment, please call your pharmacy.      Follow-Up: At [facility], you and your health needs are our priority.  As part of our continuing mission to provide you with exceptional heart care, we have created designated Provider Care Teams.  These Care Teams include your primary Cardiologist (physician) and Advanced Practice Providers (APPs -  Physician Assistants and Nurse Practitioners) who all work together to provide you with the care you need, when you need it. You will need  a follow up appointment in 1 year with Dr. Marlou Porch.  Please call our office 2 months in advance to schedule this appointment.  You may see Candee Furbish, MD or one of the following Advanced Practice Providers on your designated Care Team:   Truitt Merle, NP Cecilie Kicks, NP . Kathyrn Drown, NP        Signed, Candee Furbish, MD  06/21/2018 9:35 AM    [place]

## 2018-06-21 NOTE — Patient Instructions (Signed)
Medication Instructions:   Your physician recommends that you continue on your current medications as directed. Please refer to the Current Medication list given to you today.  If you need a refill on your cardiac medications before your next appointment, please call your pharmacy.      Follow-Up: At [facility], you and your health needs are our priority.  As part of our continuing mission to provide you with exceptional heart care, we have created designated Provider Care Teams.  These Care Teams include your primary Cardiologist (physician) and Advanced Practice Providers (APPs -  Physician Assistants and Nurse Practitioners) who all work together to provide you with the care you need, when you need it. You will need a follow up appointment in 1 year with Dr. Marlou Porch.  Please call our office 2 months in advance to schedule this appointment.  You may see Candee Furbish, MD or one of the following Advanced Practice Providers on your designated Care Team:   Truitt Merle, NP Cecilie Kicks, NP . Kathyrn Drown, NP

## 2018-06-26 ENCOUNTER — Other Ambulatory Visit: Payer: Self-pay | Admitting: Cardiology

## 2018-07-24 DIAGNOSIS — E7849 Other hyperlipidemia: Secondary | ICD-10-CM | POA: Diagnosis not present

## 2018-07-24 DIAGNOSIS — D510 Vitamin B12 deficiency anemia due to intrinsic factor deficiency: Secondary | ICD-10-CM | POA: Diagnosis not present

## 2018-07-24 DIAGNOSIS — R82998 Other abnormal findings in urine: Secondary | ICD-10-CM | POA: Diagnosis not present

## 2018-07-24 DIAGNOSIS — E1151 Type 2 diabetes mellitus with diabetic peripheral angiopathy without gangrene: Secondary | ICD-10-CM | POA: Diagnosis not present

## 2018-07-24 DIAGNOSIS — E538 Deficiency of other specified B group vitamins: Secondary | ICD-10-CM | POA: Diagnosis not present

## 2018-07-24 DIAGNOSIS — Z125 Encounter for screening for malignant neoplasm of prostate: Secondary | ICD-10-CM | POA: Diagnosis not present

## 2018-07-24 DIAGNOSIS — M109 Gout, unspecified: Secondary | ICD-10-CM | POA: Diagnosis not present

## 2018-07-24 DIAGNOSIS — E1139 Type 2 diabetes mellitus with other diabetic ophthalmic complication: Secondary | ICD-10-CM | POA: Diagnosis not present

## 2018-07-31 DIAGNOSIS — H401132 Primary open-angle glaucoma, bilateral, moderate stage: Secondary | ICD-10-CM | POA: Diagnosis not present

## 2018-07-31 DIAGNOSIS — E7849 Other hyperlipidemia: Secondary | ICD-10-CM | POA: Diagnosis not present

## 2018-07-31 DIAGNOSIS — M109 Gout, unspecified: Secondary | ICD-10-CM | POA: Diagnosis not present

## 2018-07-31 DIAGNOSIS — H3563 Retinal hemorrhage, bilateral: Secondary | ICD-10-CM | POA: Diagnosis not present

## 2018-07-31 DIAGNOSIS — I10 Essential (primary) hypertension: Secondary | ICD-10-CM | POA: Diagnosis not present

## 2018-07-31 DIAGNOSIS — E113551 Type 2 diabetes mellitus with stable proliferative diabetic retinopathy, right eye: Secondary | ICD-10-CM | POA: Diagnosis not present

## 2018-07-31 DIAGNOSIS — Z6829 Body mass index (BMI) 29.0-29.9, adult: Secondary | ICD-10-CM | POA: Diagnosis not present

## 2018-07-31 DIAGNOSIS — Z1389 Encounter for screening for other disorder: Secondary | ICD-10-CM | POA: Diagnosis not present

## 2018-07-31 DIAGNOSIS — Z0000 Encounter for general adult medical examination without abnormal findings: Secondary | ICD-10-CM | POA: Diagnosis not present

## 2018-07-31 DIAGNOSIS — E113552 Type 2 diabetes mellitus with stable proliferative diabetic retinopathy, left eye: Secondary | ICD-10-CM | POA: Diagnosis not present

## 2018-07-31 DIAGNOSIS — G4733 Obstructive sleep apnea (adult) (pediatric): Secondary | ICD-10-CM | POA: Diagnosis not present

## 2018-07-31 DIAGNOSIS — I2510 Atherosclerotic heart disease of native coronary artery without angina pectoris: Secondary | ICD-10-CM | POA: Diagnosis not present

## 2018-07-31 DIAGNOSIS — E1151 Type 2 diabetes mellitus with diabetic peripheral angiopathy without gangrene: Secondary | ICD-10-CM | POA: Diagnosis not present

## 2018-07-31 DIAGNOSIS — D510 Vitamin B12 deficiency anemia due to intrinsic factor deficiency: Secondary | ICD-10-CM | POA: Diagnosis not present

## 2018-07-31 DIAGNOSIS — Z794 Long term (current) use of insulin: Secondary | ICD-10-CM | POA: Diagnosis not present

## 2018-07-31 DIAGNOSIS — J310 Chronic rhinitis: Secondary | ICD-10-CM | POA: Diagnosis not present

## 2018-08-01 DIAGNOSIS — Z1212 Encounter for screening for malignant neoplasm of rectum: Secondary | ICD-10-CM | POA: Diagnosis not present

## 2018-08-21 ENCOUNTER — Encounter: Payer: Self-pay | Admitting: Podiatry

## 2018-08-21 ENCOUNTER — Ambulatory Visit (INDEPENDENT_AMBULATORY_CARE_PROVIDER_SITE_OTHER): Payer: Medicare Other | Admitting: Podiatry

## 2018-08-21 DIAGNOSIS — M79674 Pain in right toe(s): Secondary | ICD-10-CM | POA: Diagnosis not present

## 2018-08-21 DIAGNOSIS — M79675 Pain in left toe(s): Secondary | ICD-10-CM

## 2018-08-21 DIAGNOSIS — B351 Tinea unguium: Secondary | ICD-10-CM | POA: Diagnosis not present

## 2018-08-21 NOTE — Progress Notes (Signed)
Subjective: Nathan [name] presents today with history of neuropathy with cc of painful, mycotic toenails.  Pain is aggravated when wearing enclosed shoe gear and relieved with periodic professional debridement.  He relates injury to top of right foot has healed.  He has a new scab on his left great toe. He states he attempted to cut his left 2nd toenail and accidentally cut the skin on his left great toe. Area is healing now. He denies any swelling, redness or drainage from area.  Leanna Battles, MD is his PCP.  He states his blood sugar was 105 mg/dL this morning.  Last A1c 6.9.   Current Outpatient Medications:  .  acetaminophen (TYLENOL) 500 MG tablet, Take 500 mg by mouth daily as needed for moderate pain., Disp: , Rfl:  .  allopurinol (ZYLOPRIM) 300 MG tablet, Take 300 mg by mouth daily., Disp: , Rfl:  .  ALPRAZolam (XANAX) 0.25 MG tablet, Take 0.25 mg by mouth daily as needed for anxiety. , Disp: , Rfl:  .  amLODipine (NORVASC) 5 MG tablet, Take 5 mg by mouth every morning. , Disp: , Rfl:  .  Ascorbic Acid (VITAMIN C) 1000 MG tablet, Take 1,000 mg by mouth daily., Disp: , Rfl:  .  aspirin 81 MG tablet, Take 1 tablet (81 mg total) by mouth daily., Disp: , Rfl:  .  atorvastatin (LIPITOR) 20 MG tablet, Take 20 mg by mouth daily., Disp: , Rfl: 3 .  B-D ULTRAFINE III SHORT PEN 31G X 8 MM MISC, Inject 1 application into the skin daily., Disp: , Rfl: 5 .  BAYER CONTOUR TEST test strip, USE 1 STRIP THREE TIMES DAILY TO CHECK BLOOD SUGARS., Disp: , Rfl: 11 .  Cholecalciferol (VITAMIN D3) 1000 UNITS capsule, Take 10,000 Units by mouth daily. , Disp: , Rfl:  .  co-enzyme Q-10 30 MG capsule, Take 200 mg by mouth 2 (two) times daily. , Disp: , Rfl:  .  Cyanocobalamin (VITAMIN B-12 IJ), Inject 1,000 mcg into the muscle every 30 (thirty) days. Amt/dose is unknown, Disp: , Rfl:  .  cycloSPORINE (RESTASIS) 0.05 % ophthalmic emulsion, Place 1 drop into both eyes 2 (two) times daily., Disp: , Rfl:  .   fish oil-omega-3 fatty acids 1000 MG capsule, Take 2 g by mouth daily. , Disp: , Rfl:  .  glipiZIDE (GLUCOTROL) 5 MG tablet, Take 5 mg by mouth every morning. , Disp: , Rfl:  .  hydrochlorothiazide (MICROZIDE) 12.5 MG capsule, Take 12.5 mg by mouth daily. Reported on 08/20/2015, Disp: , Rfl:  .  HYDROcodone-acetaminophen (VICODIN) 5-500 MG per tablet, Take 1 tablet by mouth daily as needed for pain. , Disp: , Rfl:  .  isosorbide mononitrate (IMDUR) 30 MG 24 hr tablet, Take 1 tablet (30 mg total) by mouth daily. ** DO NOT CRUSH **, Disp: 90 tablet, Rfl: 3 .  latanoprost (XALATAN) 0.005 % ophthalmic solution, Place 1 drop into both eyes at bedtime.  , Disp: , Rfl:  .  LEVEMIR FLEXTOUCH 100 UNIT/ML Pen, Inject 20 Units into the skin daily. , Disp: , Rfl: 12 .  losartan (COZAAR) 50 MG tablet, Take 1 tablet (50 mg total) by mouth daily., Disp: 90 tablet, Rfl: 1 .  metFORMIN (GLUCOPHAGE) 1000 MG tablet, Take 1,000 mg by mouth daily with breakfast., Disp: , Rfl:  .  metoprolol (LOPRESSOR) 50 MG tablet, Take 50 mg by mouth 2 (two) times daily. , Disp: , Rfl:  .  minoxidil (LONITEN) 10 MG tablet, Take 10  mg by mouth daily. , Disp: , Rfl: 5 .  Multiple Vitamin (MULTIVITAMIN) tablet, Take 1 tablet by mouth daily.  , Disp: , Rfl:  .  mupirocin ointment (BACTROBAN) 2 %, Apply to wound twice a day., Disp: 30 g, Rfl: 2 .  nitroGLYCERIN (NITROSTAT) 0.4 MG SL tablet, Place 1 tablet under the tongue as needed., Disp: , Rfl: 12 .  omeprazole (PRILOSEC) 40 MG capsule, Take 40 mg by mouth 2 (two) times daily., Disp: , Rfl: 2 .  oxyCODONE-acetaminophen (PERCOCET/ROXICET) 5-325 MG tablet, Take 1 tablet by mouth daily as needed for moderate pain or severe pain. , Disp: , Rfl:  .  oxymetazoline (AFRIN) 0.05 % nasal spray, Place 2 sprays into the nose 2 (two) times daily as needed for congestion. Reported on 08/20/2015, Disp: , Rfl:  .  trolamine salicylate (ASPERCREME) 10 % cream, Apply 1 application topically as needed for  muscle pain., Disp: , Rfl:   Allergies  Allergen Reactions  . Shellfish-Derived Products Anaphylaxis  . Zolpidem Tartrate Other (See Comments)    Hallucinations    Objective:  Vascular Examination: Capillary refill time immediate x 10 digits Dorsalis pedis palpable bilaterally Posterior tibial pulses are faintly palpable bilaterally Digital hair x 10 digits was sparse Skin temperature gradient WNL b/l  Dermatological Examination: Skin with normal turgor, texture and tone b/l  Toenails 1-5 b/l discolored, thick, dystrophic with subungual debris and pain with palpation to nailbeds due to thickness of nails.  Left hallux lateral border significant for scab noted.  This is consistent with patient's given history of injury to the left great toe.  There is no erythema, no edema, no drainage or flocculence noted.  Area is essentially healed.  Abrasion noted on last visit to the top of his right foot has resolved.  Musculoskeletal: Muscle strength 5/5 to all muscle groups b/l Hammertoes 2 through 5 bilaterally  Neurological: Sensation with 10 gram monofilament is absent b/l Vibratory sensation absent b/l  Assessment: 1. Painful onychomycosis toenails 1-5 b/l 2. NIDDM with neuropathy  Plan: 1. Continue diabetic foot care principles.  Literature dispensed today.   2. Toenails 1-5 b/l were debrided in length and girth without iatrogenic bleeding. 3. Patient to continue soft, supportive shoe gear 4. Patient to report any pedal injuries to medical professional  5. Follow up 3 months. Patient/POA to call should there be a concern in the interim.

## 2018-08-21 NOTE — Patient Instructions (Addendum)
Onychomycosis/Fungal Toenails  WHAT IS IT? An infection that lies within the keratin of your nail plate that is caused by a fungus.  WHY ME? Fungal infections affect all ages, sexes, races, and creeds.  There may be many factors that predispose you to a fungal infection such as age, coexisting medical conditions such as diabetes, or an autoimmune disease; stress, medications, fatigue, genetics, etc.  Bottom line: fungus thrives in a warm, moist environment and your shoes offer such a location.  IS IT CONTAGIOUS? Theoretically, yes.  You do not want to share shoes, nail clippers or files with someone who has fungal toenails.  Walking around barefoot in the same room or sleeping in the same bed is unlikely to transfer the organism.  It is important to realize, however, that fungus can spread easily from one nail to the next on the same foot.  HOW DO WE TREAT THIS?  There are several ways to treat this condition.  Treatment may depend on many factors such as age, medications, pregnancy, liver and kidney conditions, etc.  It is best to ask your doctor which options are available to you.  1. No treatment.   Unlike many other medical concerns, you can live with this condition.  However for many people this can be a painful condition and may lead to ingrown toenails or a bacterial infection.  It is recommended that you keep the nails cut short to help reduce the amount of fungal nail. 2. Topical treatment.  These range from herbal remedies to prescription strength nail lacquers.  About 40-50% effective, topicals require twice daily application for approximately 9 to 12 months or until an entirely new nail has grown out.  The most effective topicals are medical grade medications available through physicians offices. 3. Oral antifungal medications.  With an 80-90% cure rate, the most common oral medication requires 3 to 4 months of therapy and stays in your system for a year as the new nail grows out.  Oral  antifungal medications do require blood work to make sure it is a safe drug for you.  A liver function panel will be performed prior to starting the medication and after the first month of treatment.  It is important to have the blood work performed to avoid any harmful side effects.  In general, this medication safe but blood work is required. 4. Laser Therapy.  This treatment is performed by applying a specialized laser to the affected nail plate.  This therapy is noninvasive, fast, and non-painful.  It is not covered by insurance and is therefore, out of pocket.  The results have been very good with a 80-95% cure rate.  [facility] is the only practice in the area to offer this therapy. Permanent Nail Avulsion.  Removing the entire nail so that a new nail will not grow back. Diabetic Neuropathy Diabetic neuropathy refers to nerve damage that is caused by diabetes (diabetes mellitus). Over time, people with diabetes can develop nerve damage throughout the body. There are several types of diabetic neuropathy: Peripheral neuropathy. This is the most common type of diabetic neuropathy. It causes damage to nerves that carry signals between the spinal cord and other parts of the body (peripheral nerves). This usually affects nerves in the feet and legs first, and may eventually affect the hands and arms. The damage affects the ability to sense touch or temperature. Autonomic neuropathy. This type causes damage to nerves that control involuntary functions (autonomic nerves). These nerves carry signals  that control: Heartbeat. Body temperature. Blood pressure. Urination. Digestion. Sweating. Sexual function. Response to changing blood sugar (glucose) levels. Focal neuropathy. This type of nerve damage affects one area of the body, such as an arm, a leg, or the face. The injury may involve one nerve or a small group of nerves. Focal neuropathy can be painful and unpredictable, and occurs most  often in older adults with diabetes. This often develops suddenly, but usually improves over time and does not cause long-term problems. Proximal neuropathy. This type of nerve damage affects the nerves of the thighs, hips, buttocks, or legs. It causes severe pain, weakness, and muscle death (atrophy), usually in the thigh muscles. It is more common among older men and people who have type 2 diabetes. The length of recovery time may vary. What are the causes? Peripheral, autonomic, and focal neuropathies are caused by diabetes that is not well controlled with treatment. The cause of proximal neuropathy is not known, but it may be caused by inflammation related to uncontrolled blood glucose levels. What are the signs or symptoms? Peripheral neuropathy Peripheral neuropathy develops slowly over time. When the nerves of the feet and legs no longer work, you may experience: Burning, stabbing, or aching pain in the legs or feet. Pain or cramping in the legs or feet. Loss of feeling (numbness) and inability to feel pressure or pain in the feet. This can lead to: Thick calluses or sores on areas of constant pressure. Ulcers. Reduced ability to feel temperature changes. Foot deformities. Muscle weakness. Loss of balance or coordination. Autonomic neuropathy The symptoms of autonomic neuropathy vary depending on which nerves are affected. Symptoms may include: Problems with digestion, such as: Nausea or vomiting. Poor appetite. Bloating. Diarrhea or constipation. Trouble swallowing. Losing weight without trying to. Problems with the heart, blood and lungs, such as: Dizziness, especially when standing up. Fainting. Shortness of breath. Irregular heartbeat. Bladder problems, such as: Trouble starting or stopping urination. Leaking urine. Trouble emptying the bladder. Urinary tract infections (UTIs). Problems with other body functions, such as: Sweat. You may sweat too much or too  little. Temperature. You might get hot easily. Or, you might feel cold more than usual. Sexual function. Men may not be able to get or maintain an erection. Women may have vaginal dryness and difficulty with arousal. Focal neuropathy Symptoms affect only one area of the body. Common symptoms include: Numbness. Tingling. Burning pain. Prickling feeling. Very sensitive skin. Weakness. Inability to move (paralysis). Muscle twitching. Muscles getting smaller (wasting). Poor coordination. Double or blurred vision. Proximal neuropathy Sudden, severe pain in the hip, thigh, or buttocks. Pain may spread from the back into the legs (sciatica). Pain and numbness in the arms and legs. Tingling. Loss of bladder control or bowel control. Weakness and wasting of thigh muscles. Difficulty getting up from a seated position. Abdominal swelling. Unexplained weight loss. How is this diagnosed? Diagnosis usually involves reviewing your medical history and any symptoms you have. Diagnosis varies depending on the type of neuropathy your health care provider suspects. Peripheral neuropathy Your health care provider will check areas that are affected by your nervous system (neurologic exam), such as your reflexes, how you move, and what you can feel. You may have other tests, such as: Blood tests. Removal and examination of fluid that surrounds the spinal cord (lumbar puncture). CT scan. MRI. A test to check the nerves that control muscles (electromyogram, EMG). Tests of how quickly messages pass through your nerves (nerve conduction velocity tests). Removal of  a small piece of nerve to be examined under a microscope (biopsy). Autonomic neuropathy You may have tests, such as: Tests to measure your blood pressure and heart rate. This may include monitoring you while you are safely secured to an exam table that moves you from a lying position to an upright position (table tilt test). Breathing tests to  check your lungs. Tests to check how food moves through the digestive system (gastric emptying tests). Blood, sweat, or urine tests. Ultrasound of your bladder. Spinal fluid tests. Focal neuropathy This condition may be diagnosed with: A neurologic exam. CT scan. MRI. EMG. Nerve conduction velocity tests. Proximal neuropathy There is no test to diagnose this type of neuropathy. You may have tests to rule out other possible causes of this type of neuropathy. Tests may include: X-rays of your spine and lumbar region. Lumbar puncture. MRI. How is this treated? The goal of treatment is to keep nerve damage from getting worse. The most important part of treatment is keeping your blood glucose level and your A1C level within your target range by following your diabetes management plan. Over time, maintaining lower blood glucose levels helps lessen symptoms. In some cases, you may need prescription pain medicine. Follow these instructions at home:  Lifestyle  Do not use any products that contain nicotine or tobacco, such as cigarettes and e-cigarettes. If you need help quitting, ask your health care provider. Be physically active every day. Include strength training and balance exercises. Follow a healthy meal plan. Work with your health care provider to manage your blood pressure. General instructions Follow your diabetes management plan as directed. Check your blood glucose levels as directed by your health care provider. Keep your blood glucose in your target range as directed by your health care provider. Have your A1C level checked at least two times a year, or as often as told by your health care provider. Take over the counter and prescription medicines only as told by your health care provider. This includes insulin and diabetes medicine. Do not drive or use heavy machinery while taking prescription pain medicines. Check your skin and feet every day for cuts, bruises, redness,  blisters, or sores. Keep all follow up visits as told by your health care provider. This is important. Contact a health care provider if: You have burning, stabbing, or aching pain in your legs or feet. You are unable to feel pressure or pain in your feet. You develop problems with digestion, such as: Nausea. Vomiting. Bloating. Constipation. Diarrhea. Abdominal pain. You have difficulty with urination, such as inability: To control when you urinate (incontinence). To completely empty the bladder (retention). You have palpitations. You feel dizzy, weak, or faint when you stand up. Get help right away if: You cannot urinate. You have sudden weakness or loss of coordination. You have trouble speaking. You have pain or pressure in your chest. You have an irregular heart beat. You have sudden inability to move a part of your body. Summary Diabetic neuropathy refers to nerve damage that is caused by diabetes. It can affect nerves throughout the entire body, causing numbness and pain in the arms, legs, digestive tract, heart, and other body systems. Keep your blood glucose level and your blood pressure in your target range, as directed by your health care provider. This can help prevent neuropathy from getting worse. Check your skin and feet every day for cuts, bruises, redness, blisters, or sores. Do not use any products that contain nicotine or tobacco, such as cigarettes  and e-cigarettes. If you need help quitting, ask your health care provider. This information is not intended to replace advice given to you by your health care provider. Make sure you discuss any questions you have with your health care provider. Document Released: 09/05/2001 Document Revised: 08/09/2017 Document Reviewed: 08/01/2016 Elsevier Interactive Patient Education  [employer]. 5.

## 2018-08-23 DIAGNOSIS — D649 Anemia, unspecified: Secondary | ICD-10-CM | POA: Diagnosis not present

## 2018-11-06 DIAGNOSIS — Z794 Long term (current) use of insulin: Secondary | ICD-10-CM | POA: Diagnosis not present

## 2018-11-06 DIAGNOSIS — I10 Essential (primary) hypertension: Secondary | ICD-10-CM | POA: Diagnosis not present

## 2018-11-06 DIAGNOSIS — G4733 Obstructive sleep apnea (adult) (pediatric): Secondary | ICD-10-CM | POA: Diagnosis not present

## 2018-11-06 DIAGNOSIS — E1151 Type 2 diabetes mellitus with diabetic peripheral angiopathy without gangrene: Secondary | ICD-10-CM | POA: Diagnosis not present

## 2018-11-06 DIAGNOSIS — I2510 Atherosclerotic heart disease of native coronary artery without angina pectoris: Secondary | ICD-10-CM | POA: Diagnosis not present

## 2018-11-20 ENCOUNTER — Ambulatory Visit: Payer: Medicare Other | Admitting: Podiatry

## 2018-12-19 DIAGNOSIS — D510 Vitamin B12 deficiency anemia due to intrinsic factor deficiency: Secondary | ICD-10-CM | POA: Diagnosis not present

## 2019-01-16 ENCOUNTER — Ambulatory Visit: Payer: Medicare Other | Admitting: Podiatry

## 2019-02-21 DIAGNOSIS — D510 Vitamin B12 deficiency anemia due to intrinsic factor deficiency: Secondary | ICD-10-CM | POA: Diagnosis not present

## 2019-03-30 DIAGNOSIS — Z23 Encounter for immunization: Secondary | ICD-10-CM | POA: Diagnosis not present

## 2019-04-25 DIAGNOSIS — D510 Vitamin B12 deficiency anemia due to intrinsic factor deficiency: Secondary | ICD-10-CM | POA: Diagnosis not present

## 2019-06-28 ENCOUNTER — Other Ambulatory Visit: Payer: Self-pay | Admitting: Cardiology

## 2019-07-04 ENCOUNTER — Ambulatory Visit: Payer: Medicare Other | Admitting: Cardiology

## 2019-07-26 ENCOUNTER — Other Ambulatory Visit: Payer: Self-pay

## 2019-07-26 ENCOUNTER — Encounter: Payer: Self-pay | Admitting: Cardiology

## 2019-07-26 ENCOUNTER — Ambulatory Visit (INDEPENDENT_AMBULATORY_CARE_PROVIDER_SITE_OTHER): Payer: Medicare Other | Admitting: Cardiology

## 2019-07-26 VITALS — BP 104/62 | HR 73 | Ht 67.0 in | Wt 183.0 lb

## 2019-07-26 DIAGNOSIS — Z952 Presence of prosthetic heart valve: Secondary | ICD-10-CM

## 2019-07-26 DIAGNOSIS — I25810 Atherosclerosis of coronary artery bypass graft(s) without angina pectoris: Secondary | ICD-10-CM | POA: Diagnosis not present

## 2019-07-26 DIAGNOSIS — Z951 Presence of aortocoronary bypass graft: Secondary | ICD-10-CM

## 2019-07-26 DIAGNOSIS — E1169 Type 2 diabetes mellitus with other specified complication: Secondary | ICD-10-CM

## 2019-07-26 DIAGNOSIS — E785 Hyperlipidemia, unspecified: Secondary | ICD-10-CM | POA: Diagnosis not present

## 2019-07-26 NOTE — Patient Instructions (Signed)
Medication Instructions:  The current medical regimen is effective;  continue present plan and medications.  *If you need a refill on your cardiac medications before your next appointment, please call your pharmacy*  Follow-Up: At [facility], you and your health needs are our priority.  As part of our continuing mission to provide you with exceptional heart care, we have created designated Provider Care Teams.  These Care Teams include your primary Cardiologist (physician) and Advanced Practice Providers (APPs -  Physician Assistants and Nurse Practitioners) who all work together to provide you with the care you need, when you need it.  Your next appointment:   12 month(s)  The format for your next appointment:   In Person  Provider:   Mark Skains, MD   Thank you for choosing [facility]!!     

## 2019-07-26 NOTE — Progress Notes (Signed)
Cardiology Office Note:    Date:  07/26/2019   ID:  Nathan [name], DOB 1946/06/20, MRN [id number]  PCP:  Nathan Battles, MD  Cardiologist:  Nathan Furbish, MD  Electrophysiologist:  None   Referring MD: Nathan Battles, MD     History of Present Illness:    Nathan [name] is a 74 y.o. male here for follow-up of chest pain.  Was in the emergency room 04/04/2018 with chest discomfort.  He was in the ER on 04/04/2018 after being stung by a wasp on his fingertip.  Hurt a lot.  He started to feel chest discomfort and shortness of breath.  Nitroglycerin no relief.  Ibuprofen helped ease the pain.  Former patient of Dr. Sherryl [name], had CABG and aortic valve replacement for severe aortic stenosis in April 2010.  Tissue valve.  Underwent cardiac catheterization 03/01/2016, SVG to RCA was the only occluded graft.  Medical management.  He had a nuclear stress test preceding the cath that showed anterolateral defect.  Enjoyed using the isosorbide gave him more energy.  Medications reviewed compliant.  He recounted previously story 1994 when he had a "flesh eating virus "Dr. Risa [name] help save his life.  Previously stationed in [place] 1968.  He is quite claustrophobic.  Enjoyed the [employer] on country music.  He was friends with Nathan [name].  07/26/2019-here for the follow-up of prior chest discomfort.  Note reviewed as above.  Catheterization 2017 SVG to RCA occluded graft medical management.  Anterolateral defect on stress test.  Isosorbide seem to help.  More energy.  Denies any fevers chills nausea vomiting syncope.  Feels well walks about a mile a day.  Recent blood work with Dr. Sharlett Iles reviewed.    Past Medical History:  Diagnosis Date  . Allergy   . Aortic stenosis   . Arthritis   . Blood transfusion without reported diagnosis   . CAD (coronary artery disease)   . Cataract    both eyes surgically removed  . Diabetes mellitus, type 2 ([place])   . Diverticulosis of  colon (without mention of hemorrhage)   . Duodenal ulcer perforation ([employer])    blood transfusion  . Flesh-eating bacteria ([place]) 1995  . GERD (gastroesophageal reflux disease)   . Glaucoma    pre glaucoma on Xalatan drops  . Heart murmur   . History of diabetic retinopathy   . History of gastrointestinal bleeding   . History of necrotizing fasciitis   . History of prosthetic aortic valve   . Hypercholesteremia   . Myocardial infarction ([place]) 02/23/2016  . Nightmares    CHRONIC  . OSA (obstructive sleep apnea)    borderline no CPAP   . Personal history of colonic polyps 11/23/2009   TUBULAR ADENOMA  . Postoperative anemia   . Sinus bradycardia   . Sleep apnea    no cpap  . Stomach ulcer    Hx of  . Systolic hypertension   . Vitamin B12 deficiency     Past Surgical History:  Procedure Laterality Date  . AORTIC VALVE REPLACEMENT    . BLEPHAROPLASTY  11-24-14  . CARDIAC CATHETERIZATION N/A 03/01/2016   Procedure: Coronary/Graft Angiography;  Surgeon: Nathan M Martinique, MD;  Location: [facility];  Service: Cardiovascular;  Laterality: N/A;  . CATARACT EXTRACTION W/ INTRAOCULAR LENS IMPLANT     OD  . COLONOSCOPY    . CORONARY ARTERY BYPASS GRAFT  2010   x 4  . flesh eating disease surgery  surgeries x 7   . [facility]  . POLYPECTOMY    . RETINAL LASER PROCEDURE    . [place]   resection  . UMBILICAL HERNIA REPAIR N/A 04/10/2013   Procedure: HERNIA REPAIR UMBILICAL ADULT with mesh;  Surgeon: Nathan Earls, MD;  Location: [facility];  Service: General;  Laterality: N/A;    Current Medications: Current Meds  Medication Sig  . acetaminophen (TYLENOL) 500 MG tablet Take 500 mg by mouth daily as needed for moderate pain.  Marland Kitchen allopurinol (ZYLOPRIM) 300 MG tablet Take 300 mg by mouth daily.  Marland Kitchen ALPRAZolam (XANAX) 0.25 MG tablet Take 0.25 mg by mouth daily as needed for anxiety.   Marland Kitchen amLODipine (NORVASC) 5 MG tablet Take 5 mg by mouth every morning.     . Ascorbic Acid (VITAMIN C) 1000 MG tablet Take 1,000 mg by mouth daily.  Marland Kitchen aspirin 81 MG tablet Take 1 tablet (81 mg total) by mouth daily.  Marland Kitchen atorvastatin (LIPITOR) 20 MG tablet Take 20 mg by mouth daily.  . B-D ULTRAFINE III SHORT PEN 31G X 8 MM MISC Inject 1 application into the skin daily.  Marland Kitchen BAYER CONTOUR TEST test strip USE 1 STRIP THREE TIMES DAILY TO CHECK BLOOD SUGARS.  Marland Kitchen Cholecalciferol (VITAMIN D3) 1000 UNITS capsule Take 10,000 Units by mouth daily.   Marland Kitchen co-enzyme Q-10 30 MG capsule Take 200 mg by mouth 2 (two) times daily.   . Cyanocobalamin (VITAMIN B-12 IJ) Inject 1,000 mcg into the muscle every 30 (thirty) days. Amt/dose is unknown  . cycloSPORINE (RESTASIS) 0.05 % ophthalmic emulsion Place 1 drop into both eyes 2 (two) times daily.  . fish oil-omega-3 fatty acids 1000 MG capsule Take 2 g by mouth daily.   Marland Kitchen glipiZIDE (GLUCOTROL) 5 MG tablet Take 5 mg by mouth every morning.   . hydrochlorothiazide (MICROZIDE) 12.5 MG capsule Take 12.5 mg by mouth daily. Reported on 08/20/2015  . HYDROcodone-acetaminophen (VICODIN) 5-500 MG per tablet Take 1 tablet by mouth daily as needed for pain.   . isosorbide mononitrate (IMDUR) 30 MG 24 hr tablet TAKE 1 TABLET (30 MG TOTAL) BY MOUTH DAILY. ** DO NOT CRUSH **  . latanoprost (XALATAN) 0.005 % ophthalmic solution Place 1 drop into both eyes at bedtime.    Marland Kitchen LEVEMIR FLEXTOUCH 100 UNIT/ML Pen Inject 20 Units into the skin daily.   Marland Kitchen losartan (COZAAR) 50 MG tablet Take 1 tablet (50 mg total) by mouth daily.  . metFORMIN (GLUCOPHAGE) 1000 MG tablet Take 1,000 mg by mouth daily with breakfast.  . metoprolol (LOPRESSOR) 50 MG tablet Take 50 mg by mouth 2 (two) times daily.   . minoxidil (LONITEN) 10 MG tablet Take 10 mg by mouth daily.   . Multiple Vitamin (MULTIVITAMIN) tablet Take 1 tablet by mouth daily.    . mupirocin ointment (BACTROBAN) 2 % Apply to wound twice a day.  . nitroGLYCERIN (NITROSTAT) 0.4 MG SL tablet Place 1 tablet under the  tongue as needed.  Marland Kitchen omeprazole (PRILOSEC) 40 MG capsule Take 40 mg by mouth 2 (two) times daily.  Marland Kitchen oxyCODONE-acetaminophen (PERCOCET/ROXICET) 5-325 MG tablet Take 1 tablet by mouth daily as needed for moderate pain or severe pain.   Marland Kitchen oxymetazoline (AFRIN) 0.05 % nasal spray Place 2 sprays into the nose 2 (two) times daily as needed for congestion. Reported on 08/20/2015  . trolamine salicylate (ASPERCREME) 10 % cream Apply 1 application topically as needed for muscle pain.  Allergies:   Shellfish-derived products and Zolpidem tartrate   Social History   Socioeconomic History  . Marital status: Married    Spouse name: Not on file  . Number of children: 0  . Years of education: Not on file  . Highest education level: Not on file  Occupational History  . Occupation: Scientist, clinical (histocompatibility and immunogenetics): [employer]  Tobacco Use  . Smoking status: Never Smoker  . Smokeless tobacco: Never Used  Substance and Sexual Activity  . Alcohol use: No    Alcohol/week: 0.0 standard drinks  . Drug use: No  . Sexual activity: Not on file  Other Topics Concern  . Not on file  Social History Narrative  . Not on file   Social Determinants of Health   Financial Resource Strain:   . Difficulty of Paying Living Expenses: Not on file  Food Insecurity:   . Worried About Charity fundraiser in the Last Year: Not on file  . Ran Out of Food in the Last Year: Not on file  Transportation Needs:   . Lack of Transportation (Medical): Not on file  . Lack of Transportation (Non-Medical): Not on file  Physical Activity:   . Days of Exercise per Week: Not on file  . Minutes of Exercise per Session: Not on file  Stress:   . Feeling of Stress : Not on file  Social Connections:   . Frequency of Communication with Friends and Family: Not on file  . Frequency of Social Gatherings with Friends and Family: Not on file  . Attends Religious Services: Not on file  . Active Member of Clubs or Organizations: Not on file   . Attends Archivist Meetings: Not on file  . Marital Status: Not on file     Family History: The patient's family history includes Alcohol abuse in his father; Cirrhosis in his father; Colon cancer (age of onset: 32) in his mother; Diabetes in his mother and sister; Hypertension in his mother and sister; Rectal cancer in his mother. There is no history of Heart attack, Stroke, Colon polyps, Esophageal cancer, or Stomach cancer.  ROS:   Please see the history of present illness.    No fevers chills nausea vomiting syncope bleeding.  All other systems reviewed and are negative.  EKGs/Labs/Other Studies Reviewed:    The following studies were reviewed today: Prior office notes EKG catheterization  ECHO 11/09/16  - Left ventricle: The cavity size was normal. Wall thickness was increased in a pattern of mild LVH. Systolic function was normal. The estimated ejection fraction was in the range of 55% to 60%. Wall motion was normal; there were no regional wall motion abnormalities. Features are consistent with a pseudonormal left ventricular filling pattern, with concomitant abnormal relaxation and increased filling pressure (grade 2 diastolic dysfunction). - Aortic valve: A bioprosthesis was present. - Ascending aorta: The ascending aorta was mildly dilated. - Mitral valve: Calcified annulus. - Left atrium: The atrium was mildly dilated. - Pulmonary arteries: Systolic pressure was mildly increased. PA peak pressure: 32 mm Hg (S).  Impressions:  - Normal LV systolic function; moderate diastolic dysfunction; s/p AVR with mildly elevated mean gradient (19 mmHg); no AI; mildly dilated ascending aorta; mild LAE; trace TR with mildly elevated pulmonary pressure.   CABG op report 10/20/08: Coronary artery bypass grafting x3 with the left internal mammary to the left anterior descending coronary artery,reverse saphenous vein graft to circumflex coronary  artery, reversesaphenous vein graft to the posterior  descending coronary artery and aortic valve replacement with a pericardial tissue valve. [employer], [name], 23-mm, serial number [id number], withbilateral thigh endovein harvesting.  Cath 03/01/16:  Mid RCA lesion, 50 %stenosed.  Mid RCA to Dist RCA lesion, 100 %stenosed.  Prox Cx to Mid Cx lesion, 99 %stenosed.  Prox LAD to Mid LAD lesion, 100 %stenosed.  SVG to RCA is occluded  Origin lesion, 100 %stenosed.  SVG to OM2 is widely patent.  LIMA and is normal in caliber and anatomically normal.  1. Severe 3 vessel occlusive CAD 2. Patent LIMA to the LAD 3. Patent SVG to large OM2 4. Occluded SVG to PDA. Distal RCA is occluded and fills by left to right collaterals.   Plan: recommend medical management.    EKG:  EKG is  ordered today.  The ekg ordered today demonstrates 07/26/2019-sinus rhythm 73 PAC-prior 06/21/2018-sinus bradycardia 56 septal infarct pattern no significant change.  Personally reviewed and interpreted.  11/07/16-sinus rhythm septal infarct pattern, nonspecific T-wave changes, no other abnormalities, poor R-wave progression noted. The ekg ordered today demonstrates 02/09/16-sinus rhythm, 63, no other abnormalities.  Recent Labs: No results found for requested labs within last 8760 hours.  Recent Lipid Panel No results found for: CHOL, TRIG, HDL, CHOLHDL, VLDL, LDLCALC, LDLDIRECT  Physical Exam:    VS:  BP 104/62   Pulse 73   Ht 5\' 7"  (1.702 m)   Wt 183 lb (83 kg)   SpO2 96%   BMI 28.66 kg/m     Wt Readings from Last 3 Encounters:  07/26/19 183 lb (83 kg)  06/21/18 188 lb 3.2 oz (85.4 kg)  06/21/17 190 lb 12.8 oz (86.5 kg)     GEN: Well nourished, well developed, in no acute distress  HEENT: normal  Neck: no JVD, carotid bruits, or masses Cardiac: RRR; 2/6 systolic murmur, bioprosthetic aortic valve , rubs, or gallops,no edema  Respiratory:  clear to auscultation bilaterally,  normal work of breathing GI: soft, nontender, nondistended, + BS MS: no deformity or atrophy  Skin: warm and dry, no rash Neuro:  Alert and Oriented x 3, Strength and sensation are intact Psych: euthymic mood, full affect   ASSESSMENT:    1. Coronary artery disease involving autologous vein coronary bypass graft without angina pectoris   2. Hx of CABG   3. S/P AVR (aortic valve replacement)   4. Type 2 diabetes mellitus with hyperlipidemia (HCC)    PLAN:    In order of problems listed above:  Coronary artery disease CABG prior chest pain - Continuing with isosorbide, medical management.  See cardiac catheterization as above. -Aspirin 81, statin, continue with secondary prevention strategy.  Aortic valve replacement - Bioprosthetic valve was functioning normally in 2018 on echocardiogram.  Dental prophylaxis.  Palpitations -Previously likely described as PVCs or PACs.  No evidence of atrial fibrillation.  Doing well.  Heard on exam.  History of GI bleed - Gastric ulcer hemorrhage 2010, no further bleeding since then.  Diabetes with hyperlipidemia obesity and obstructive sleep apnea - Continue with current plan.  No changes.  Has been monitored by Dr. Sharlett Iles. -LDL 56 hemoglobin 411.4 creatinine 1.3 outside labs reviewed.  He is signed up to get his first COVID-19 vaccine.  1 year follow-up.   Medication Adjustments/Labs and Tests Ordered: Current medicines are reviewed at length with the patient today.  Concerns regarding medicines are outlined above.  Orders Placed This Encounter  Procedures  . EKG 12-Lead cs   No orders of the defined types  were placed in this encounter.   Patient Instructions  Medication Instructions:  The current medical regimen is effective;  continue present plan and medications.  *If you need a refill on your cardiac medications before your next appointment, please call your pharmacy*  Follow-Up: At [facility], you and your health  needs are our priority.  As part of our continuing mission to provide you with exceptional heart care, we have created designated Provider Care Teams.  These Care Teams include your primary Cardiologist (physician) and Advanced Practice Providers (APPs -  Physician Assistants and Nurse Practitioners) who all work together to provide you with the care you need, when you need it.  Your next appointment:   12 month(s)  The format for your next appointment:   In Person  Provider:   Candee Furbish, MD  Thank you for choosing [facility]!!        Signed, Nathan Furbish, MD  07/26/2019 9:49 AM    [place]

## 2019-07-30 DIAGNOSIS — E7849 Other hyperlipidemia: Secondary | ICD-10-CM | POA: Diagnosis not present

## 2019-07-30 DIAGNOSIS — Z125 Encounter for screening for malignant neoplasm of prostate: Secondary | ICD-10-CM | POA: Diagnosis not present

## 2019-07-30 DIAGNOSIS — M109 Gout, unspecified: Secondary | ICD-10-CM | POA: Diagnosis not present

## 2019-07-30 DIAGNOSIS — D510 Vitamin B12 deficiency anemia due to intrinsic factor deficiency: Secondary | ICD-10-CM | POA: Diagnosis not present

## 2019-07-30 DIAGNOSIS — E1139 Type 2 diabetes mellitus with other diabetic ophthalmic complication: Secondary | ICD-10-CM | POA: Diagnosis not present

## 2019-08-01 DIAGNOSIS — I10 Essential (primary) hypertension: Secondary | ICD-10-CM | POA: Diagnosis not present

## 2019-08-01 DIAGNOSIS — R82998 Other abnormal findings in urine: Secondary | ICD-10-CM | POA: Diagnosis not present

## 2019-08-06 ENCOUNTER — Other Ambulatory Visit: Payer: Self-pay | Admitting: Cardiology

## 2019-08-06 DIAGNOSIS — E785 Hyperlipidemia, unspecified: Secondary | ICD-10-CM | POA: Diagnosis not present

## 2019-08-06 DIAGNOSIS — Z0000 Encounter for general adult medical examination without abnormal findings: Secondary | ICD-10-CM | POA: Diagnosis not present

## 2019-08-06 DIAGNOSIS — E1151 Type 2 diabetes mellitus with diabetic peripheral angiopathy without gangrene: Secondary | ICD-10-CM | POA: Diagnosis not present

## 2019-08-06 DIAGNOSIS — Z1339 Encounter for screening examination for other mental health and behavioral disorders: Secondary | ICD-10-CM | POA: Diagnosis not present

## 2019-08-06 DIAGNOSIS — D510 Vitamin B12 deficiency anemia due to intrinsic factor deficiency: Secondary | ICD-10-CM | POA: Diagnosis not present

## 2019-08-06 DIAGNOSIS — H409 Unspecified glaucoma: Secondary | ICD-10-CM | POA: Diagnosis not present

## 2019-08-06 DIAGNOSIS — Z794 Long term (current) use of insulin: Secondary | ICD-10-CM | POA: Diagnosis not present

## 2019-08-06 DIAGNOSIS — G4733 Obstructive sleep apnea (adult) (pediatric): Secondary | ICD-10-CM | POA: Diagnosis not present

## 2019-08-06 DIAGNOSIS — I2510 Atherosclerotic heart disease of native coronary artery without angina pectoris: Secondary | ICD-10-CM | POA: Diagnosis not present

## 2019-08-06 DIAGNOSIS — H9190 Unspecified hearing loss, unspecified ear: Secondary | ICD-10-CM | POA: Diagnosis not present

## 2019-08-06 DIAGNOSIS — Z1331 Encounter for screening for depression: Secondary | ICD-10-CM | POA: Diagnosis not present

## 2019-08-06 DIAGNOSIS — I739 Peripheral vascular disease, unspecified: Secondary | ICD-10-CM | POA: Diagnosis not present

## 2019-08-06 DIAGNOSIS — M109 Gout, unspecified: Secondary | ICD-10-CM | POA: Diagnosis not present

## 2019-08-06 DIAGNOSIS — I10 Essential (primary) hypertension: Secondary | ICD-10-CM | POA: Diagnosis not present

## 2019-08-16 DIAGNOSIS — Z1212 Encounter for screening for malignant neoplasm of rectum: Secondary | ICD-10-CM | POA: Diagnosis not present

## 2019-09-11 DIAGNOSIS — D510 Vitamin B12 deficiency anemia due to intrinsic factor deficiency: Secondary | ICD-10-CM | POA: Diagnosis not present

## 2019-09-25 DIAGNOSIS — H903 Sensorineural hearing loss, bilateral: Secondary | ICD-10-CM | POA: Diagnosis not present

## 2019-09-25 DIAGNOSIS — Z570 Occupational exposure to noise: Secondary | ICD-10-CM | POA: Diagnosis not present

## 2019-09-25 DIAGNOSIS — E119 Type 2 diabetes mellitus without complications: Secondary | ICD-10-CM | POA: Diagnosis not present

## 2019-09-25 DIAGNOSIS — H9313 Tinnitus, bilateral: Secondary | ICD-10-CM | POA: Diagnosis not present

## 2019-11-07 DIAGNOSIS — D510 Vitamin B12 deficiency anemia due to intrinsic factor deficiency: Secondary | ICD-10-CM | POA: Diagnosis not present

## 2020-01-22 DIAGNOSIS — D510 Vitamin B12 deficiency anemia due to intrinsic factor deficiency: Secondary | ICD-10-CM | POA: Diagnosis not present

## 2020-02-06 DIAGNOSIS — I10 Essential (primary) hypertension: Secondary | ICD-10-CM | POA: Diagnosis not present

## 2020-02-06 DIAGNOSIS — I739 Peripheral vascular disease, unspecified: Secondary | ICD-10-CM | POA: Diagnosis not present

## 2020-02-06 DIAGNOSIS — E1151 Type 2 diabetes mellitus with diabetic peripheral angiopathy without gangrene: Secondary | ICD-10-CM | POA: Diagnosis not present

## 2020-03-25 DIAGNOSIS — D510 Vitamin B12 deficiency anemia due to intrinsic factor deficiency: Secondary | ICD-10-CM | POA: Diagnosis not present

## 2020-04-07 ENCOUNTER — Ambulatory Visit: Payer: Medicare Other | Attending: Internal Medicine

## 2020-04-07 DIAGNOSIS — Z23 Encounter for immunization: Secondary | ICD-10-CM

## 2020-04-07 NOTE — Progress Notes (Signed)
   Covid-19 Vaccination Clinic  Name:  Raed Schalk    MRN: [id number] DOB: 1946-01-01  04/07/2020  Mr. Hatlestad was observed post Covid-19 immunization for 15 minutes without incident. He was provided with Vaccine Information Sheet and instruction to access the V-Safe system.   Mr. Heyward was instructed to call 911 with any severe reactions post vaccine: Marland Kitchen Difficulty breathing  . Swelling of face and throat  . A fast heartbeat  . A bad rash all over body  . Dizziness and weakness

## 2020-05-08 DIAGNOSIS — Z23 Encounter for immunization: Secondary | ICD-10-CM | POA: Diagnosis not present

## 2020-05-08 DIAGNOSIS — I2510 Atherosclerotic heart disease of native coronary artery without angina pectoris: Secondary | ICD-10-CM | POA: Diagnosis not present

## 2020-05-08 DIAGNOSIS — H9190 Unspecified hearing loss, unspecified ear: Secondary | ICD-10-CM | POA: Diagnosis not present

## 2020-05-08 DIAGNOSIS — G4733 Obstructive sleep apnea (adult) (pediatric): Secondary | ICD-10-CM | POA: Diagnosis not present

## 2020-05-08 DIAGNOSIS — R350 Frequency of micturition: Secondary | ICD-10-CM | POA: Diagnosis not present

## 2020-05-08 DIAGNOSIS — Z794 Long term (current) use of insulin: Secondary | ICD-10-CM | POA: Diagnosis not present

## 2020-05-08 DIAGNOSIS — M109 Gout, unspecified: Secondary | ICD-10-CM | POA: Diagnosis not present

## 2020-05-08 DIAGNOSIS — I10 Essential (primary) hypertension: Secondary | ICD-10-CM | POA: Diagnosis not present

## 2020-05-08 DIAGNOSIS — E785 Hyperlipidemia, unspecified: Secondary | ICD-10-CM | POA: Diagnosis not present

## 2020-05-08 DIAGNOSIS — I739 Peripheral vascular disease, unspecified: Secondary | ICD-10-CM | POA: Diagnosis not present

## 2020-05-08 DIAGNOSIS — E669 Obesity, unspecified: Secondary | ICD-10-CM | POA: Diagnosis not present

## 2020-05-08 DIAGNOSIS — E1151 Type 2 diabetes mellitus with diabetic peripheral angiopathy without gangrene: Secondary | ICD-10-CM | POA: Diagnosis not present

## 2020-05-27 DIAGNOSIS — D510 Vitamin B12 deficiency anemia due to intrinsic factor deficiency: Secondary | ICD-10-CM | POA: Diagnosis not present

## 2020-06-30 DIAGNOSIS — R350 Frequency of micturition: Secondary | ICD-10-CM | POA: Diagnosis not present

## 2020-06-30 DIAGNOSIS — R3915 Urgency of urination: Secondary | ICD-10-CM | POA: Diagnosis not present

## 2020-06-30 DIAGNOSIS — N401 Enlarged prostate with lower urinary tract symptoms: Secondary | ICD-10-CM | POA: Diagnosis not present

## 2020-07-13 DIAGNOSIS — N200 Calculus of kidney: Secondary | ICD-10-CM | POA: Diagnosis not present

## 2020-07-13 DIAGNOSIS — Z87442 Personal history of urinary calculi: Secondary | ICD-10-CM | POA: Diagnosis not present

## 2020-07-13 DIAGNOSIS — K5730 Diverticulosis of large intestine without perforation or abscess without bleeding: Secondary | ICD-10-CM | POA: Diagnosis not present

## 2020-07-26 ENCOUNTER — Other Ambulatory Visit: Payer: Self-pay | Admitting: Cardiology

## 2020-08-03 ENCOUNTER — Encounter: Payer: Self-pay | Admitting: Cardiology

## 2020-08-03 ENCOUNTER — Other Ambulatory Visit: Payer: Self-pay

## 2020-08-03 ENCOUNTER — Telehealth (INDEPENDENT_AMBULATORY_CARE_PROVIDER_SITE_OTHER): Payer: Medicare Other | Admitting: Cardiology

## 2020-08-03 VITALS — BP 152/72 | HR 77 | Ht 67.0 in | Wt 171.0 lb

## 2020-08-03 DIAGNOSIS — I25810 Atherosclerosis of coronary artery bypass graft(s) without angina pectoris: Secondary | ICD-10-CM

## 2020-08-03 DIAGNOSIS — Z952 Presence of prosthetic heart valve: Secondary | ICD-10-CM | POA: Diagnosis not present

## 2020-08-03 NOTE — Patient Instructions (Addendum)
Medication Instructions:  The current medical regimen is effective;  continue present plan and medications.  *If you need a refill on your cardiac medications before your next appointment, please call your pharmacy*  Testing/Procedures: Your physician has requested that you have an echocardiogram. Echocardiography is a painless test that uses sound waves to create images of your heart. It provides your doctor with information about the size and shape of your heart and how well your heart's chambers and valves are working. This procedure takes approximately one hour. There are no restrictions for this procedure.  You will contacted to be scheduled for this appointment.  The testing will be completed at [address],  [place], [place].  Follow-Up: At [facility], you and your health needs are our priority.  As part of our continuing mission to provide you with exceptional heart care, we have created designated Provider Care Teams.  These Care Teams include your primary Cardiologist (physician) and Advanced Practice Providers (APPs -  Physician Assistants and Nurse Practitioners) who all work together to provide you with the care you need, when you need it.  We recommend signing up for the patient portal called "MyChart".  Sign up information is provided on this After Visit Summary.  MyChart is used to connect with patients for Virtual Visits (Telemedicine).  Patients are able to view lab/test results, encounter notes, upcoming appointments, etc.  Non-urgent messages can be sent to your provider as well.   To learn more about what you can do with MyChart, go to [email or web address].    Your next appointment:   12 month(s)  The format for your next appointment:   In Person  Provider:   Candee Furbish, MD   Thank you for choosing [facility]!!

## 2020-08-03 NOTE — Progress Notes (Signed)
Virtual Visit via Telephone Note   This visit type was conducted due to national recommendations for restrictions regarding the COVID-19 Pandemic (e.g. social distancing) in an effort to limit this patient's exposure and mitigate transmission in our community.  Due to his co-morbid illnesses, this patient is at least at moderate risk for complications without adequate follow up.  This format is felt to be most appropriate for this patient at this time.  The patient did not have access to video technology/had technical difficulties with video requiring transitioning to audio format only (telephone).  All issues noted in this document were discussed and addressed.  No physical exam could be performed with this format.  Please refer to the patient's chart for his  consent to telehealth for [facility].    Date:  08/03/2020   ID:  Kannan Proia, DOB 07-Jul-1946, MRN [id number] The patient was identified using 2 identifiers.  Patient Location: Home Provider Location: Home Office  PCP:  Leanna Battles, MD  Cardiologist:  Candee Furbish, MD  Electrophysiologist:  None   Evaluation Performed:  Follow-Up Visit   History of Present Illness:    Gibson Lad is a 75 y.o. male here for the follow-up of coronary artery disease status post CABG, aortic valve replacement in 2010, April.  Tissue valve.  Last ER visit was in 2019 after he was stung by a wasp on his fingertip.  He felt some chest discomfort at that time but ibuprofen helped release the pain.  On 03/01/2016 underwent cardiac catheterization which demonstrated SVG to RCA was the only occluded graft.  Medical management.  Nuclear stress test preceding the catheterization showed an anterior lateral defect.  Reviewed.  Stated in the past that isosorbide gave him more energy.  Has been compliant with his medications.  In 1994 he had a "flesh eating virus "and Dr. Risa Grill help save his life.  He was previously stationed in [place] in  1968.  Claustrophobia.  He enjoyed the [employer] on country music.  He states that he was friends with any Bryson Corona.  Doing well without any fever chills nausea vomiting syncope bleeding.  Continues to walk about a mile a day.  He did try to shovel snow in his driveway the other day and felt some fatigue but no anginal symptoms.  He states that he will probably not do that again.  Denies any bleeding issues.  No myalgias no fevers chills nausea vomiting syncope.  The patient does not have symptoms concerning for COVID-19 infection (fever, chills, cough, or new shortness of breath).    Past Medical History:  Diagnosis Date  . Allergy   . Aortic stenosis   . Arthritis   . Blood transfusion without reported diagnosis   . CAD (coronary artery disease)   . Cataract    both eyes surgically removed  . Diabetes mellitus, type 2 ([place])   . Diverticulosis of colon (without mention of hemorrhage)   . Duodenal ulcer perforation ([place])    blood transfusion  . Flesh-eating bacteria ([place]) 1995  . GERD (gastroesophageal reflux disease)   . Glaucoma    pre glaucoma on Xalatan drops  . Heart murmur   . History of diabetic retinopathy   . History of gastrointestinal bleeding   . History of necrotizing fasciitis   . History of prosthetic aortic valve   . Hypercholesteremia   . Myocardial infarction ([place]) 02/23/2016  . Nightmares    CHRONIC  . OSA (obstructive sleep apnea)  borderline no CPAP   . Personal history of colonic polyps 11/23/2009   TUBULAR ADENOMA  . Postoperative anemia   . Sinus bradycardia   . Sleep apnea    no cpap  . Stomach ulcer    Hx of  . Systolic hypertension   . Vitamin B12 deficiency    Past Surgical History:  Procedure Laterality Date  . AORTIC VALVE REPLACEMENT    . BLEPHAROPLASTY  11-24-14  . CARDIAC CATHETERIZATION N/A 03/01/2016   Procedure: Coronary/Graft Angiography;  Surgeon: Peter M Swaziland, MD;  Location[facility] INVASIVE CV LAB;  Service:  Cardiovascular;  Laterality: N/A;  . CATARACT EXTRACTION W/ INTRAOCULAR LENS IMPLANT     OD  . COLONOSCOPY    . CORONARY ARTERY BYPASS GRAFT  2010   x 4  . flesh eating disease surgery      surgeries x 7   . HERNIA REPAIR  1972  . POLYPECTOMY    . RETINAL LASER PROCEDURE    . SCROTAL SURGERY  1995   resection  . UMBILICAL HERNIA REPAIR N/A 04/10/2013   Procedure: HERNIA REPAIR UMBILICAL ADULT with mesh;  Surgeon: Valarie Merino, MD;  Location: [facility] ORS;  Service: General;  Laterality: N/A;     Current Meds  Medication Sig  . acetaminophen (TYLENOL) 500 MG tablet Take 500 mg by mouth daily as needed for moderate pain.  Marland Kitchen allopurinol (ZYLOPRIM) 300 MG tablet Take 300 mg by mouth daily.  Marland Kitchen ALPRAZolam (XANAX) 0.25 MG tablet Take 0.25 mg by mouth daily as needed for anxiety.  Marland Kitchen amLODipine (NORVASC) 5 MG tablet Take 5 mg by mouth every morning.  . Ascorbic Acid (VITAMIN C) 1000 MG tablet Take 1,000 mg by mouth daily.  Marland Kitchen aspirin 81 MG tablet Take 1 tablet (81 mg total) by mouth daily.  Marland Kitchen atorvastatin (LIPITOR) 20 MG tablet Take 20 mg by mouth daily.  . B-D ULTRAFINE III SHORT PEN 31G X 8 MM MISC Inject 1 application into the skin daily.  Marland Kitchen BAYER CONTOUR TEST test strip USE 1 STRIP THREE TIMES DAILY TO CHECK BLOOD SUGARS.  Marland Kitchen Cholecalciferol 25 MCG (1000 UT) capsule Take 10,000 Units by mouth daily.  Marland Kitchen co-enzyme Q-10 30 MG capsule Take 200 mg by mouth 2 (two) times daily.   . Cyanocobalamin (VITAMIN B-12 IJ) Inject 1,000 mcg into the muscle every 30 (thirty) days. Amt/dose is unknown  . cycloSPORINE (RESTASIS) 0.05 % ophthalmic emulsion Place 1 drop into both eyes 2 (two) times daily.  . fish oil-omega-3 fatty acids 1000 MG capsule Take 2 g by mouth daily.  Marland Kitchen glipiZIDE (GLUCOTROL) 5 MG tablet Take 5 mg by mouth every morning.  . hydrochlorothiazide (MICROZIDE) 12.5 MG capsule Take 12.5 mg by mouth daily. Reported on 08/20/2015  . HYDROcodone-acetaminophen (VICODIN) 5-500 MG per tablet Take 1  tablet by mouth daily as needed for pain.   . isosorbide mononitrate (IMDUR) 30 MG 24 hr tablet TAKE 1 TABLET (30 MG TOTAL) BY MOUTH DAILY. ** DO NOT CRUSH **  . latanoprost (XALATAN) 0.005 % ophthalmic solution Place 1 drop into both eyes at bedtime.  Marland Kitchen LEVEMIR FLEXTOUCH 100 UNIT/ML Pen Inject 20 Units into the skin daily.   Marland Kitchen losartan (COZAAR) 50 MG tablet Take 1 tablet (50 mg total) by mouth daily.  . metFORMIN (GLUCOPHAGE) 1000 MG tablet Take 1,000 mg by mouth daily with breakfast.  . metoprolol (LOPRESSOR) 50 MG tablet Take 50 mg by mouth 2 (two) times daily.  . minoxidil (LONITEN) 10  MG tablet Take 10 mg by mouth daily.   . Multiple Vitamin (MULTIVITAMIN) tablet Take 1 tablet by mouth daily.  . mupirocin ointment (BACTROBAN) 2 % Apply to wound twice a day.  . nitroGLYCERIN (NITROSTAT) 0.4 MG SL tablet Place 1 tablet under the tongue as needed.  Marland Kitchen omeprazole (PRILOSEC) 40 MG capsule Take 40 mg by mouth 2 (two) times daily.  Marland Kitchen oxyCODONE-acetaminophen (PERCOCET/ROXICET) 5-325 MG tablet Take 1 tablet by mouth daily as needed for moderate pain or severe pain.   Marland Kitchen oxymetazoline (AFRIN) 0.05 % nasal spray Place 2 sprays into the nose 2 (two) times daily as needed for congestion. Reported on 08/20/2015  . trolamine salicylate (ASPERCREME) 10 % cream Apply 1 application topically as needed for muscle pain.     Allergies:   Shellfish-derived products and Zolpidem tartrate   Social History   Tobacco Use  . Smoking status: Never Smoker  . Smokeless tobacco: Never Used  Vaping Use  . Vaping Use: Never used  Substance Use Topics  . Alcohol use: No    Alcohol/week: 0.0 standard drinks  . Drug use: No     Family Hx: The patient's family history includes Alcohol abuse in his father; Cirrhosis in his father; Colon cancer (age of onset: 56) in his mother; Diabetes in his mother and sister; Hypertension in his mother and sister; Rectal cancer in his mother. There is no history of Heart attack,  Stroke, Colon polyps, Esophageal cancer, or Stomach cancer.  ROS:   Please see the history of present illness.     All other systems reviewed and are negative.   Prior CV studies:   The following studies were reviewed today:  Echocardiogram 11/09/2016 -EF 60% pseudonormal diastolic dysfunction.  Bioprosthetic aortic valve functioning normally.  Mildly elevated mean gradient of 19 mmHg.  No AI.   CABG op report 10/20/08: Coronary artery bypass grafting x3 with the left internal mammary to the left anterior descending coronary artery,reverse saphenous vein graft to circumflex coronary artery, reversesaphenous vein graft to the posterior descending coronary artery and aortic valve replacement with a pericardial tissue valve.[employer], [name], 23-mm, serial number [id number], withbilateral thigh endovein harvesting.  Cath 03/01/16:  Mid RCA lesion, 50 %stenosed.  Mid RCA to Dist RCA lesion, 100 %stenosed.  Prox Cx to Mid Cx lesion, 99 %stenosed.  Prox LAD to Mid LAD lesion, 100 %stenosed.  SVG to RCA is occluded  Origin lesion, 100 %stenosed.  SVG to OM2 is widely patent.  LIMA and is normal in caliber and anatomically normal.  1. Severe 3 vessel occlusive CAD 2. Patent LIMA to the LAD 3. Patent SVG to large OM2 4. Occluded SVG to PDA. Distal RCA is occluded and fills by left to right collaterals.   Plan: recommend medical management.   Labs/Other Tests and Data Reviewed:    EKG: EKG 2021 showed sinus rhythm with PAC heart rate 73 bpm  Recent Labs: No results found for requested labs within last 8760 hours.   Recent Lipid Panel No results found for: CHOL, TRIG, HDL, CHOLHDL, LDLCALC, LDLDIRECT  Wt Readings from Last 3 Encounters:  08/03/20 171 lb (77.6 kg)  07/26/19 183 lb (83 kg)  06/21/18 188 lb 3.2 oz (85.4 kg)     Risk Assessment/Calculations:     Objective:    Vital Signs:  BP (!) 152/72   Pulse 77   Ht 5\' 7"  (1.702 m)   Wt 171  lb (77.6 kg)   BMI 26.78 kg/m  General alert and oriented x3 no acute distress no respiratory distress noted able to complete full sentences without difficulty  ASSESSMENT & PLAN:    Coronary artery disease status post CABG -Continue with isosorbide medical management aspirin statin secondary prevention strategy with goal-directed medical therapy. -No anginal symptoms, continue with exercise.  Aortic valve replacement -Bioprosthetic functioning normally in 2018 on echocardiogram with mean aortic valve gradient 19 millimeters of mercury -Continue with dental prophylaxis -Repeat echocardiogram  Palpitations -Previously described like PVCs or PACs.  No evidence of atrial fibrillation.  Previously heard on cardiac exam.  Doing well.  Prior history of GI bleed -2010 gastric ulcer hemorrhage.  No further bleeding since then.  Diabetes with hyperlipidemia obesity obstructive sleep apnea -Dr. Sharlett Iles has been monitoring closely.  Prior LDL has been 56 creatinine 1.3 hemoglobin 11.4 from outside labs.  1 year follow-up.       COVID-19 Education: The signs and symptoms of COVID-19 were discussed with the patient and how to seek care for testing (follow up with PCP or arrange E-visit).  The importance of social distancing was discussed today.  Time:   Today, I have spent 21 minutes with the patient with telehealth technology discussing the above problems and in review of his medical records, pertinent test as above.     Medication Adjustments/Labs and Tests Ordered: Current medicines are reviewed at length with the patient today.  Concerns regarding medicines are outlined above.   Tests Ordered: Orders Placed This Encounter  Procedures  . ECHOCARDIOGRAM COMPLETE    Medication Changes: No orders of the defined types were placed in this encounter.   Follow Up:  In Person in 1 year(s)  Signed, Candee Furbish, MD  08/03/2020 10:01 AM    [place]

## 2020-08-04 DIAGNOSIS — M109 Gout, unspecified: Secondary | ICD-10-CM | POA: Diagnosis not present

## 2020-08-04 DIAGNOSIS — E119 Type 2 diabetes mellitus without complications: Secondary | ICD-10-CM | POA: Diagnosis not present

## 2020-08-04 DIAGNOSIS — E785 Hyperlipidemia, unspecified: Secondary | ICD-10-CM | POA: Diagnosis not present

## 2020-08-04 DIAGNOSIS — D510 Vitamin B12 deficiency anemia due to intrinsic factor deficiency: Secondary | ICD-10-CM | POA: Diagnosis not present

## 2020-08-04 DIAGNOSIS — Z125 Encounter for screening for malignant neoplasm of prostate: Secondary | ICD-10-CM | POA: Diagnosis not present

## 2020-08-11 DIAGNOSIS — Z1331 Encounter for screening for depression: Secondary | ICD-10-CM | POA: Diagnosis not present

## 2020-08-11 DIAGNOSIS — M109 Gout, unspecified: Secondary | ICD-10-CM | POA: Diagnosis not present

## 2020-08-11 DIAGNOSIS — Z0000 Encounter for general adult medical examination without abnormal findings: Secondary | ICD-10-CM | POA: Diagnosis not present

## 2020-08-11 DIAGNOSIS — D510 Vitamin B12 deficiency anemia due to intrinsic factor deficiency: Secondary | ICD-10-CM | POA: Diagnosis not present

## 2020-08-11 DIAGNOSIS — E1151 Type 2 diabetes mellitus with diabetic peripheral angiopathy without gangrene: Secondary | ICD-10-CM | POA: Diagnosis not present

## 2020-08-11 DIAGNOSIS — Z1212 Encounter for screening for malignant neoplasm of rectum: Secondary | ICD-10-CM | POA: Diagnosis not present

## 2020-08-11 DIAGNOSIS — I739 Peripheral vascular disease, unspecified: Secondary | ICD-10-CM | POA: Diagnosis not present

## 2020-08-11 DIAGNOSIS — E785 Hyperlipidemia, unspecified: Secondary | ICD-10-CM | POA: Diagnosis not present

## 2020-08-11 DIAGNOSIS — I10 Essential (primary) hypertension: Secondary | ICD-10-CM | POA: Diagnosis not present

## 2020-08-11 DIAGNOSIS — G4733 Obstructive sleep apnea (adult) (pediatric): Secondary | ICD-10-CM | POA: Diagnosis not present

## 2020-08-11 DIAGNOSIS — R82998 Other abnormal findings in urine: Secondary | ICD-10-CM | POA: Diagnosis not present

## 2020-08-11 DIAGNOSIS — Z794 Long term (current) use of insulin: Secondary | ICD-10-CM | POA: Diagnosis not present

## 2020-08-11 DIAGNOSIS — I2510 Atherosclerotic heart disease of native coronary artery without angina pectoris: Secondary | ICD-10-CM | POA: Diagnosis not present

## 2020-08-12 DIAGNOSIS — N401 Enlarged prostate with lower urinary tract symptoms: Secondary | ICD-10-CM | POA: Diagnosis not present

## 2020-08-12 DIAGNOSIS — R3915 Urgency of urination: Secondary | ICD-10-CM | POA: Diagnosis not present

## 2020-08-12 DIAGNOSIS — R3989 Other symptoms and signs involving the genitourinary system: Secondary | ICD-10-CM | POA: Diagnosis not present

## 2020-08-12 DIAGNOSIS — R350 Frequency of micturition: Secondary | ICD-10-CM | POA: Diagnosis not present

## 2020-08-21 ENCOUNTER — Other Ambulatory Visit: Payer: Self-pay

## 2020-08-21 ENCOUNTER — Ambulatory Visit (HOSPITAL_COMMUNITY): Payer: Medicare Other | Attending: Cardiology

## 2020-08-21 DIAGNOSIS — Z952 Presence of prosthetic heart valve: Secondary | ICD-10-CM | POA: Insufficient documentation

## 2020-08-21 DIAGNOSIS — I25810 Atherosclerosis of coronary artery bypass graft(s) without angina pectoris: Secondary | ICD-10-CM | POA: Insufficient documentation

## 2020-08-21 LAB — ECHOCARDIOGRAM COMPLETE
AR max vel: 0.87 cm2
AV Area VTI: 0.87 cm2
AV Area mean vel: 0.91 cm2
AV Mean grad: 12 mmHg
AV Peak grad: 25.4 mmHg
Ao pk vel: 2.52 m/s
Area-P 1/2: 3.65 cm2
S' Lateral: 3.3 cm

## 2020-08-24 ENCOUNTER — Encounter: Payer: Self-pay | Admitting: Internal Medicine

## 2020-09-17 DIAGNOSIS — D510 Vitamin B12 deficiency anemia due to intrinsic factor deficiency: Secondary | ICD-10-CM | POA: Diagnosis not present

## 2020-10-12 DIAGNOSIS — W57XXXA Bitten or stung by nonvenomous insect and other nonvenomous arthropods, initial encounter: Secondary | ICD-10-CM | POA: Diagnosis not present

## 2020-10-12 DIAGNOSIS — S40262A Insect bite (nonvenomous) of left shoulder, initial encounter: Secondary | ICD-10-CM | POA: Diagnosis not present

## 2020-10-20 ENCOUNTER — Other Ambulatory Visit: Payer: Self-pay | Admitting: Cardiology

## 2020-11-10 DIAGNOSIS — G4733 Obstructive sleep apnea (adult) (pediatric): Secondary | ICD-10-CM | POA: Diagnosis not present

## 2020-11-10 DIAGNOSIS — E785 Hyperlipidemia, unspecified: Secondary | ICD-10-CM | POA: Diagnosis not present

## 2020-11-10 DIAGNOSIS — I10 Essential (primary) hypertension: Secondary | ICD-10-CM | POA: Diagnosis not present

## 2020-11-10 DIAGNOSIS — N320 Bladder-neck obstruction: Secondary | ICD-10-CM | POA: Diagnosis not present

## 2020-11-10 DIAGNOSIS — Z794 Long term (current) use of insulin: Secondary | ICD-10-CM | POA: Diagnosis not present

## 2020-11-10 DIAGNOSIS — R21 Rash and other nonspecific skin eruption: Secondary | ICD-10-CM | POA: Diagnosis not present

## 2020-11-10 DIAGNOSIS — I2510 Atherosclerotic heart disease of native coronary artery without angina pectoris: Secondary | ICD-10-CM | POA: Diagnosis not present

## 2020-11-10 DIAGNOSIS — E1151 Type 2 diabetes mellitus with diabetic peripheral angiopathy without gangrene: Secondary | ICD-10-CM | POA: Diagnosis not present

## 2020-12-31 DIAGNOSIS — D510 Vitamin B12 deficiency anemia due to intrinsic factor deficiency: Secondary | ICD-10-CM | POA: Diagnosis not present

## 2021-01-28 DIAGNOSIS — D510 Vitamin B12 deficiency anemia due to intrinsic factor deficiency: Secondary | ICD-10-CM | POA: Diagnosis not present

## 2021-03-04 DIAGNOSIS — D510 Vitamin B12 deficiency anemia due to intrinsic factor deficiency: Secondary | ICD-10-CM | POA: Diagnosis not present

## 2021-03-16 DIAGNOSIS — I739 Peripheral vascular disease, unspecified: Secondary | ICD-10-CM | POA: Diagnosis not present

## 2021-03-16 DIAGNOSIS — D510 Vitamin B12 deficiency anemia due to intrinsic factor deficiency: Secondary | ICD-10-CM | POA: Diagnosis not present

## 2021-03-16 DIAGNOSIS — I2510 Atherosclerotic heart disease of native coronary artery without angina pectoris: Secondary | ICD-10-CM | POA: Diagnosis not present

## 2021-03-16 DIAGNOSIS — G4486 Cervicogenic headache: Secondary | ICD-10-CM | POA: Diagnosis not present

## 2021-03-16 DIAGNOSIS — E785 Hyperlipidemia, unspecified: Secondary | ICD-10-CM | POA: Diagnosis not present

## 2021-03-16 DIAGNOSIS — Z23 Encounter for immunization: Secondary | ICD-10-CM | POA: Diagnosis not present

## 2021-03-16 DIAGNOSIS — I10 Essential (primary) hypertension: Secondary | ICD-10-CM | POA: Diagnosis not present

## 2021-03-16 DIAGNOSIS — E1151 Type 2 diabetes mellitus with diabetic peripheral angiopathy without gangrene: Secondary | ICD-10-CM | POA: Diagnosis not present

## 2021-03-16 DIAGNOSIS — I350 Nonrheumatic aortic (valve) stenosis: Secondary | ICD-10-CM | POA: Diagnosis not present

## 2021-03-16 DIAGNOSIS — G4733 Obstructive sleep apnea (adult) (pediatric): Secondary | ICD-10-CM | POA: Diagnosis not present

## 2021-04-19 DIAGNOSIS — G4733 Obstructive sleep apnea (adult) (pediatric): Secondary | ICD-10-CM | POA: Diagnosis not present

## 2021-05-06 DIAGNOSIS — D510 Vitamin B12 deficiency anemia due to intrinsic factor deficiency: Secondary | ICD-10-CM | POA: Diagnosis not present

## 2021-05-10 DIAGNOSIS — Z23 Encounter for immunization: Secondary | ICD-10-CM | POA: Diagnosis not present

## 2021-06-10 DIAGNOSIS — D510 Vitamin B12 deficiency anemia due to intrinsic factor deficiency: Secondary | ICD-10-CM | POA: Diagnosis not present

## 2021-07-13 DIAGNOSIS — G4733 Obstructive sleep apnea (adult) (pediatric): Secondary | ICD-10-CM | POA: Diagnosis not present

## 2021-07-13 DIAGNOSIS — E1139 Type 2 diabetes mellitus with other diabetic ophthalmic complication: Secondary | ICD-10-CM | POA: Diagnosis not present

## 2021-07-13 DIAGNOSIS — I2510 Atherosclerotic heart disease of native coronary artery without angina pectoris: Secondary | ICD-10-CM | POA: Diagnosis not present

## 2021-07-13 DIAGNOSIS — I10 Essential (primary) hypertension: Secondary | ICD-10-CM | POA: Diagnosis not present

## 2021-07-19 NOTE — Progress Notes (Signed)
Cardiology Office Note:    Date:  07/20/2021   ID:  Nathan [name], DOB 10-Feb-1946, MRN [id number]  PCP:  Nathan Lopes, MD   [facility] Providers Cardiologist:  Nathan Furbish, MD     Referring MD: Nathan Lopes, MD   Chief Complaint: 1 year follow-up CAD, aortic valve disorder  History of Present Illness:    Nathan [name] is a 76 y.o. male with a hx of CAD s/p CABG x 3, aortic valve replacement (tissue) 2010, HTN, OSA, DM, and Vit B12 deficiency  He had aortic valve replacement with tissue valve and CABG with TCTS in April 2010. He underwent cardiac catheterization on 03/01/16 for abnormal myoview which revealed that SVG to RCA was the only occluded graft. Medical management was recommended.   He was last seen by Dr. Marlou [name] via Telemedicine on 08/03/20. One year follow-up was recommended.   Today, he is here alone for annual follow-up. He denies chest pain, shortness of breath, lower extremity edema, fatigue, palpitations, melena, hematuria, hemoptysis, diaphoresis, weakness, orthopnea, and PND. Walking 1 to 1.5 miles most days without change in level of activity tolerance. No use of SL NTG. Denies bleeding concerns. He reports balance issues - has fallen on 2 occasions. Is trying to discern the reason for the fall. On one occasion he thinks he slipped on rock salt that had frozen to the outside stairs. Denies dizziness, presyncope, syncope. Does not feel physically weak and can walk for exercise without difficulty. Feels that he sometimes just loses his footing.   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   GERD  (gastroesophageal reflux disease)    Glaucoma    pre glaucoma on Xalatan drops   Heart murmur    History of diabetic retinopathy    History of gastrointestinal bleeding    History of necrotizing fasciitis    History of prosthetic aortic valve    Hypercholesteremia    Myocardial infarction ([place]) 02/23/2016   Nightmares    CHRONIC   OSA (obstructive sleep apnea)    borderline no CPAP    Personal history of colonic polyps 11/23/2009   TUBULAR ADENOMA   Postoperative anemia    Sinus bradycardia    Sleep apnea    no cpap   Stomach ulcer    Hx of   Systolic hypertension    Vitamin B12 deficiency     Past Surgical History:  Procedure Laterality Date   AORTIC VALVE REPLACEMENT     BLEPHAROPLASTY  11-24-14   CARDIAC CATHETERIZATION N/A 03/01/2016   Procedure: Coronary/Graft Angiography;  Surgeon: Nathan M Martinique, MD;  Location: [place] [facility];  Service: Cardiovascular;  Laterality: N/A;   CATARACT EXTRACTION W/ INTRAOCULAR LENS IMPLANT     OD   COLONOSCOPY     CORONARY ARTERY BYPASS GRAFT  2010   x 4   flesh eating disease surgery      surgeries x 7    [place]  resection   UMBILICAL HERNIA REPAIR N/A 04/10/2013   Procedure: HERNIA REPAIR UMBILICAL ADULT with mesh;  Surgeon: Nathan Earls, MD;  Location: [facility];  Service: General;  Laterality: N/A;    Current Medications: Current Meds  Medication Sig   acetaminophen (TYLENOL) 500 MG tablet Take 500 mg by mouth daily as needed for moderate pain.   allopurinol (ZYLOPRIM) 300 MG tablet Take 300 mg by mouth daily.   ALPRAZolam (XANAX) 0.25 MG tablet Take 0.25 mg by mouth daily as needed for anxiety.   amLODipine (NORVASC) 5 MG tablet Take 5 mg by mouth every morning.   Ascorbic Acid (VITAMIN C) 1000 MG tablet Take 1,000 mg by mouth daily.   aspirin 81 MG tablet Take 1 tablet (81 mg total) by mouth daily.   atorvastatin (LIPITOR) 20 MG tablet  Take 20 mg by mouth daily.   B-D ULTRAFINE III SHORT PEN 31G X 8 MM MISC Inject 1 application into the skin daily.   BAYER CONTOUR TEST test strip USE 1 STRIP THREE TIMES DAILY TO CHECK BLOOD SUGARS.   Cholecalciferol 25 MCG (1000 UT) capsule Take 10,000 Units by mouth daily.   co-enzyme Q-10 30 MG capsule Take 200 mg by mouth 2 (two) times daily.    Cyanocobalamin (VITAMIN B-12 IJ) Inject 1,000 mcg into the muscle every 30 (thirty) days. Amt/dose is unknown   cycloSPORINE (RESTASIS) 0.05 % ophthalmic emulsion Place 1 drop into both eyes 2 (two) times daily.   fish oil-omega-3 fatty acids 1000 MG capsule Take 2 g by mouth daily.   glipiZIDE (GLUCOTROL) 5 MG tablet Take 5 mg by mouth every morning.   hydrochlorothiazide (MICROZIDE) 12.5 MG capsule Take 12.5 mg by mouth daily. Reported on 08/20/2015   HYDROcodone-acetaminophen (VICODIN) 5-500 MG per tablet Take 1 tablet by mouth daily as needed for pain.    latanoprost (XALATAN) 0.005 % ophthalmic solution Place 1 drop into both eyes at bedtime.   Magnesium Gluconate (MAGONATE NATAL PO) Take 1 tablet by mouth daily.   metFORMIN (GLUCOPHAGE) 1000 MG tablet Take 1,000 mg by mouth daily with breakfast.   metoprolol (LOPRESSOR) 50 MG tablet Take 50 mg by mouth 2 (two) times daily.   minoxidil (LONITEN) 10 MG tablet Take 10 mg by mouth daily.    Multiple Vitamin (MULTIVITAMIN) tablet Take 1 tablet by mouth daily.   mupirocin ointment (BACTROBAN) 2 % Apply to wound twice a day.   nitroGLYCERIN (NITROSTAT) 0.4 MG SL tablet Place 1 tablet under the tongue as needed.   omeprazole (PRILOSEC) 40 MG capsule Take 40 mg by mouth 2 (two) times daily.   oxyCODONE-acetaminophen (PERCOCET/ROXICET) 5-325 MG tablet Take 1 tablet by mouth daily as needed for moderate pain or severe pain.    oxymetazoline (AFRIN) 0.05 % nasal spray Place 2 sprays into the nose 2 (two) times daily as needed for congestion. Reported on 08/20/2015   TAMSULOSIN HCL PO Take 1 capsule by mouth  daily.   trolamine salicylate (ASPERCREME) 10 % cream Apply 1 application topically as needed for muscle pain.   [DISCONTINUED] isosorbide mononitrate (IMDUR) 30 MG 24 hr tablet Take 1 tablet (30 mg total) by mouth daily. **DO NOT CRUSH**     Allergies:   Shellfish-derived products and Zolpidem tartrate   Social History   Socioeconomic History   Marital status: Married    Spouse name: Not on file   Number of children: 0   Years of education: Not on file   Highest education level: Not  on file  Occupational History   Occupation: Scientist, clinical (histocompatibility and immunogenetics): [employer]  Tobacco Use   Smoking status: Never   Smokeless tobacco: Never  Vaping Use   Vaping Use: Never used  Substance and Sexual Activity   Alcohol use: No    Alcohol/week: 0.0 standard drinks   Drug use: No   Sexual activity: Not on file  Other Topics Concern   Not on file  Social History Narrative   Not on file   Social Determinants of Health   Financial Resource Strain: Not on file  Food Insecurity: Not on file  Transportation Needs: Not on file  Physical Activity: Not on file  Stress: Not on file  Social Connections: Not on file     Family History: The patient's family history includes Alcohol abuse in his father; Cirrhosis in his father; Colon cancer (age of onset: 69) in his mother; Diabetes in his mother and sister; Hypertension in his mother and sister; Rectal cancer in his mother. There is no history of Heart attack, Stroke, Colon polyps, Esophageal cancer, or Stomach cancer.  ROS:   Please see the history of present illness.  All other systems reviewed and are negative.  Labs/Other Studies Reviewed:    The following studies were reviewed today:  Echo 2/22  Left Ventricle: Left ventricular ejection fraction, by estimation, is 60  to 65%. The left ventricle has normal function. The left ventricle has no  regional wall motion abnormalities. The left ventricular internal cavity  size was normal in  size. There is mild concentric left ventricular hypertrophy. Left ventricular diastolic parameters are consistent with Grade II diastolic dysfunction  (pseudonormalization). Elevated left ventricular end-diastolic pressure.  Right Ventricle: The right ventricular size is normal. No increase in  right ventricular wall thickness. Right ventricular systolic function is  normal. Tricuspid regurgitation signal is inadequate for assessing PA  pressure.  Left Atrium: Left atrial size was moderately dilated.  Right Atrium: Right atrial size was normal in size.  Pericardium: There is no evidence of pericardial effusion.  Mitral Valve: The mitral valve is normal in structure. Moderate mitral  annular calcification. Trivial mitral valve regurgitation. No evidence of  mitral valve stenosis.  Tricuspid Valve: The tricuspid valve is normal in structure. Tricuspid  valve regurgitation is not demonstrated. No evidence of tricuspid  stenosis.  Aortic Valve: The aortic valve has been repaired/replaced. Aortic valve  regurgitation is not visualized. No aortic stenosis is present. Aortic  valve mean gradient measures 12.0 mmHg. Aortic valve peak gradient  measures 25.4 mmHg. Aortic valve area, by  VTI measures 0.87 cm. There is a 23 mm [employer] bioprosthesis  valve present in the aortic position.  Pulmonic Valve: The pulmonic valve was normal in structure. Pulmonic valve  regurgitation is not visualized. No evidence of pulmonic stenosis.  Aorta: Aortic dilatation noted. There is mild dilatation of the ascending  aorta, measuring 37 mm. There is mild dilatation of the aortic root,  measuring 38 mm.  Venous: The inferior vena cava is normal in size with greater than 50%  respiratory variability, suggesting right atrial pressure of 3 mmHg.  IAS/Shunts: No atrial level shunt detected by color flow Doppler.   LHC 8/17  Mid RCA lesion, 50 %stenosed. Mid RCA to Dist RCA lesion, 100 %stenosed. Prox  Cx to Mid Cx lesion, 99 %stenosed. Prox LAD to Mid LAD lesion, 100 %stenosed. SVG to RCA is occluded Origin lesion, 100 %stenosed. SVG to OM2 is widely patent. LIMA  and is normal in caliber and anatomically normal.   1. Severe 3 vessel occlusive CAD 2. Patent LIMA to the LAD 3. Patent SVG to large OM2 4. Occluded SVG to PDA. Distal RCA is occluded and fills by left to right collaterals.    Plan: recommend medical management.   CABG Op Report 4/10  CABG op report 10/20/08: Coronary artery bypass grafting x3 with the left  internal mammary to the left anterior descending coronary artery, reverse saphenous vein graft to circumflex coronary artery, reverse saphenous vein graft to the posterior descending coronary artery and aortic valve replacement with a pericardial tissue valve.  [employer], [name], 23-mm, serial number [id number], with bilateral thigh endovein harvesting.  Recent Labs: From [facility]: Creat 1.4 07/20/19 08/04/20: K+ 4.3. ALT 9  Recent Lipid Panel 08/04/20 LDL 34, HDL 26, Trigs 234  Physical Exam:    VS:  BP 120/60    Pulse 78    Ht 5' 7"  (1.702 m)    Wt 169 lb 6.4 oz (76.8 kg)    SpO2 97%    BMI 26.53 kg/m     Wt Readings from Last 3 Encounters:  07/20/21 169 lb 6.4 oz (76.8 kg)  08/03/20 171 lb (77.6 kg)  07/26/19 183 lb (83 kg)     GEN:  Well nourished, well developed in no acute distress HEENT: Normal NECK: No JVD; No carotid bruits LYMPHATICS: No lymphadenopathy CARDIAC: RRR, 2/6 systolic murmur RUSB, no rubs, gallops RESPIRATORY:  Clear to auscultation without rales, wheezing or rhonchi  ABDOMEN: Soft, non-tender, non-distended MUSCULOSKELETAL:  No edema; No deformity  SKIN: Warm and dry NEUROLOGIC:  Alert and oriented x 3 PSYCHIATRIC:  Normal affect   EKG:  EKG is ordered today.  The ekg ordered today demonstrates NSR at rate of 78 bpm, LAD, no ST/T wave abnormality.  Diagnoses:    1. S/P AVR (aortic valve replacement)   2. Coronary  artery disease involving autologous vein coronary bypass graft without angina pectoris   3. Chronic heart failure with preserved ejection fraction (HFpEF) ([place])   4. Hyperlipidemia LDL goal <70   5. Essential hypertension   6. Aortic valve disorder    Assessment and Plan:     CAD without angina s/p CABG: He denies chest pain, dyspnea, or symptoms concerning for angina. Walks 1 to 1.5 miles most days without decrease in activity tolerance. Echo 2/22 reveals no regional wall abnormality. No indication for further evaluation of ischemia at this time. Continue aspirin, statin, Imdur, amlodipine, beta blocker.   Chronic HFpEF: LVEF 60 to 65%, G2DD. Appears euvolemic on exam today. He denies dyspnea, orthopnea, PND. No edema. BP is well-controlled today. Advised of the importance to maintain good blood pressure control.  As noted below, he has been on the same antihypertensive regimen for some time.  No changes at this time. Continue hydrochlorothiazide, metoprolol.  Aortic valve disorder s/p AVR replacement: Stable. Measurement of aortic valve function normal on last echo 2/22. He denies chest pain, palpitations, dyspnea, presyncope, syncope. 2/6 systolic murmur by ausculation. No further evaluation needed at this time.   Essential hypertension: BP is stable. He has home BP cuff but rarely checks BP. Discussed the importance of good blood pressure control with grade 2 diastolic dysfunction.  He has recently lost a significant amount of weight and has been maintained consistently on antihypertensive regimen for some time.  No changes at this time.  Continue hydrochlorothiazide, amlodipine, metoprolol, minoxidil, Imdur. Will check basic metabolic panel today.  DM: He reports recent A1c 6.1.  States he recently lost a significant amount of weight and feels that this contributed to his better glucose control.  He is also actively walking 1 to 1.5 miles daily. Congratulated  him on the improvement. Management  per PCP.   Hyperlipidemia LDL goal < 70: LDL 34 on 08/04/20. Will recheck today. Continue atorvastatin.     Disposition: 1 year with Dr. Marlou [name]  Medication Adjustments/Labs and Tests Ordered: Current medicines are reviewed at length with the patient today.  Concerns regarding medicines are outlined above.  Orders Placed This Encounter  Procedures   Comp Met (CMET)   Lipid Profile   EKG 12-Lead   Meds ordered this encounter  Medications   isosorbide mononitrate (IMDUR) 30 MG 24 hr tablet    Sig: Take 1 tablet (30 mg total) by mouth daily. **DO NOT CRUSH**    Dispense:  90 tablet    Refill:  3    Patient Instructions  Medication Instructions:   Your physician recommends that you continue on your current medications as directed. Please refer to the Current Medication list given to you today.  *If you need a refill on your cardiac medications before your next appointment, please call your pharmacy*   Lab Work:  TODAY!!!!! CMET/LIPID  If you have labs (blood work) drawn today and your tests are completely normal, you will receive your results only by: [place] (if you have MyChart) OR A paper copy in the mail If you have any lab test that is abnormal or we need to change your treatment, we will call you to review the results.   Testing/Procedures:  -NONE   Follow-Up: At [facility], you and your health needs are our priority.  As part of our continuing mission to provide you with exceptional heart care, we have created designated Provider Care Teams.  These Care Teams include your primary Cardiologist (physician) and Advanced Practice Providers (APPs -  Physician Assistants and Nurse Practitioners) who all work together to provide you with the care you need, when you need it.  We recommend signing up for the patient portal called "MyChart".  Sign up information is provided on this After Visit Summary.  MyChart is used to connect with patients for Virtual Visits  (Telemedicine).  Patients are able to view lab/test results, encounter notes, upcoming appointments, etc.  Non-urgent messages can be sent to your provider as well.   To learn more about what you can do with MyChart, go to [email or web address].    Your next appointment:   1 year(s)  The format for your next appointment:   In Person  Provider:   Candee Furbish, MD     Other Instructions  Your physician wants you to follow-up in: 1 year with Dr. Marlou [name].  You will receive a reminder letter in the mail two months in advance. If you don't receive a letter, please call our office to schedule the follow-up appointment.     Signed, Emmaline Life, NP  07/20/2021 10:30 AM    [place]

## 2021-07-20 ENCOUNTER — Ambulatory Visit (INDEPENDENT_AMBULATORY_CARE_PROVIDER_SITE_OTHER): Payer: Medicare Other | Admitting: Nurse Practitioner

## 2021-07-20 ENCOUNTER — Other Ambulatory Visit: Payer: Self-pay

## 2021-07-20 ENCOUNTER — Encounter: Payer: Self-pay | Admitting: Nurse Practitioner

## 2021-07-20 VITALS — BP 120/60 | HR 78 | Ht 67.0 in | Wt 169.4 lb

## 2021-07-20 DIAGNOSIS — E785 Hyperlipidemia, unspecified: Secondary | ICD-10-CM | POA: Diagnosis not present

## 2021-07-20 DIAGNOSIS — Z952 Presence of prosthetic heart valve: Secondary | ICD-10-CM

## 2021-07-20 DIAGNOSIS — I5032 Chronic diastolic (congestive) heart failure: Secondary | ICD-10-CM | POA: Diagnosis not present

## 2021-07-20 DIAGNOSIS — I10 Essential (primary) hypertension: Secondary | ICD-10-CM | POA: Diagnosis not present

## 2021-07-20 DIAGNOSIS — I25810 Atherosclerosis of coronary artery bypass graft(s) without angina pectoris: Secondary | ICD-10-CM

## 2021-07-20 DIAGNOSIS — I359 Nonrheumatic aortic valve disorder, unspecified: Secondary | ICD-10-CM

## 2021-07-20 LAB — COMPREHENSIVE METABOLIC PANEL
ALT: 12 IU/L (ref 0–44)
AST: 19 IU/L (ref 0–40)
Albumin/Globulin Ratio: 2.3 — ABNORMAL HIGH (ref 1.2–2.2)
Albumin: 4.5 g/dL (ref 3.7–4.7)
Alkaline Phosphatase: 58 IU/L (ref 44–121)
BUN/Creatinine Ratio: 14 (ref 10–24)
BUN: 18 mg/dL (ref 8–27)
Bilirubin Total: 0.5 mg/dL (ref 0.0–1.2)
CO2: 25 mmol/L (ref 20–29)
Calcium: 10 mg/dL (ref 8.6–10.2)
Chloride: 100 mmol/L (ref 96–106)
Creatinine, Ser: 1.27 mg/dL (ref 0.76–1.27)
Globulin, Total: 2 g/dL (ref 1.5–4.5)
Glucose: 116 mg/dL — ABNORMAL HIGH (ref 70–99)
Potassium: 4.4 mmol/L (ref 3.5–5.2)
Sodium: 140 mmol/L (ref 134–144)
Total Protein: 6.5 g/dL (ref 6.0–8.5)
eGFR: 59 mL/min/{1.73_m2} — ABNORMAL LOW (ref >59)

## 2021-07-20 LAB — LIPID PANEL
Chol/HDL Ratio: 4.2 ratio (ref 0.0–5.0)
Cholesterol, Total: 101 mg/dL (ref 100–199)
HDL: 24 mg/dL — ABNORMAL LOW (ref >39)
LDL Chol Calc (NIH): 46 mg/dL (ref 0–99)
Triglycerides: 184 mg/dL — ABNORMAL HIGH (ref 0–149)
VLDL Cholesterol Cal: 31 mg/dL (ref 5–40)

## 2021-07-20 MED ORDER — ISOSORBIDE MONONITRATE ER 30 MG PO TB24: 30.0000 mg | ORAL_TABLET | Freq: Every day | ORAL | 3 refills | Status: DC

## 2021-07-20 NOTE — Patient Instructions (Signed)
Medication Instructions:   Your physician recommends that you continue on your current medications as directed. Please refer to the Current Medication list given to you today.  *If you need a refill on your cardiac medications before your next appointment, please call your pharmacy*   Lab Work:  TODAY!!!!! CMET/LIPID  If you have labs (blood work) drawn today and your tests are completely normal, you will receive your results only by: [place] (if you have MyChart) OR A paper copy in the mail If you have any lab test that is abnormal or we need to change your treatment, we will call you to review the results.   Testing/Procedures:  -NONE   Follow-Up: At [facility], you and your health needs are our priority.  As part of our continuing mission to provide you with exceptional heart care, we have created designated Provider Care Teams.  These Care Teams include your primary Cardiologist (physician) and Advanced Practice Providers (APPs -  Physician Assistants and Nurse Practitioners) who all work together to provide you with the care you need, when you need it.  We recommend signing up for the patient portal called "MyChart".  Sign up information is provided on this After Visit Summary.  MyChart is used to connect with patients for Virtual Visits (Telemedicine).  Patients are able to view lab/test results, encounter notes, upcoming appointments, etc.  Non-urgent messages can be sent to your provider as well.   To learn more about what you can do with MyChart, go to [email or web address].    Your next appointment:   1 year(s)  The format for your next appointment:   In Person  Provider:   Candee Furbish, MD     Other Instructions  Your physician wants you to follow-up in: 1 year with Dr. Marlou Porch.  You will receive a reminder letter in the mail two months in advance. If you don't receive a letter, please call our office to schedule the follow-up appointment.

## 2021-07-22 DIAGNOSIS — D510 Vitamin B12 deficiency anemia due to intrinsic factor deficiency: Secondary | ICD-10-CM | POA: Diagnosis not present

## 2021-08-16 ENCOUNTER — Encounter (INDEPENDENT_AMBULATORY_CARE_PROVIDER_SITE_OTHER): Payer: Self-pay | Admitting: Ophthalmology

## 2021-08-16 ENCOUNTER — Ambulatory Visit (INDEPENDENT_AMBULATORY_CARE_PROVIDER_SITE_OTHER): Payer: Medicare Other | Admitting: Ophthalmology

## 2021-08-16 ENCOUNTER — Other Ambulatory Visit: Payer: Self-pay

## 2021-08-16 DIAGNOSIS — E113393 Type 2 diabetes mellitus with moderate nonproliferative diabetic retinopathy without macular edema, bilateral: Secondary | ICD-10-CM | POA: Insufficient documentation

## 2021-08-16 DIAGNOSIS — H401131 Primary open-angle glaucoma, bilateral, mild stage: Secondary | ICD-10-CM | POA: Diagnosis not present

## 2021-08-16 DIAGNOSIS — I25810 Atherosclerosis of coronary artery bypass graft(s) without angina pectoris: Secondary | ICD-10-CM | POA: Diagnosis not present

## 2021-08-16 DIAGNOSIS — Z961 Presence of intraocular lens: Secondary | ICD-10-CM | POA: Diagnosis not present

## 2021-08-16 DIAGNOSIS — H43813 Vitreous degeneration, bilateral: Secondary | ICD-10-CM | POA: Diagnosis not present

## 2021-08-16 NOTE — Assessment & Plan Note (Signed)
History of cataract surgery Nathan [name]

## 2021-08-16 NOTE — Assessment & Plan Note (Signed)
Continues on latanoprost 1 drop nightly OU

## 2021-08-16 NOTE — Assessment & Plan Note (Signed)
History of severe NPDR, but now with stigmata moderate NPDR stabilize likely with peripheral anterior PRP performed years previous to prevent progression to PDR.  Continue to observe follow-up 9 months recommended

## 2021-08-16 NOTE — Progress Notes (Signed)
08/16/2021     CHIEF COMPLAINT Patient presents for  Chief Complaint  Patient presents with   Retina Follow Up      HISTORY OF PRESENT ILLNESS: Nathan [name] is a 76 y.o. male who presents to the clinic today for:   HPI     Retina Follow Up           Diagnosis: Other   Laterality: both eyes   Onset: 3 years ago   Severity: mild   Duration: 3 years   Course: stable         Comments   3 year fu OU and OCT  Pt states VA OU stable since last visit. Pt denies FOL, floaters, or ocular pain OU.  Pt states, The only thing that seems to have changed is my reading vision may be a little worse."  Pt reports using Latanoprost QHS OU and Restasis Qday OU       Last edited by Kendra Opitz, COA on 08/16/2021  1:55 PM.      Referring physician: Donnajean Lopes, MD [address] [place],  [place] [address]  HISTORICAL INFORMATION:   Selected notes from the [facility]    Lab Results  Component Value Date   HGBA1C 6.9 (H) 04/10/2013     CURRENT MEDICATIONS: Current Outpatient Medications (Ophthalmic Drugs)  Medication Sig   cycloSPORINE (RESTASIS) 0.05 % ophthalmic emulsion Place 1 drop into both eyes 2 (two) times daily.   latanoprost (XALATAN) 0.005 % ophthalmic solution Place 1 drop into both eyes at bedtime.   No current facility-administered medications for this visit. (Ophthalmic Drugs)   Current Outpatient Medications (Other)  Medication Sig   acetaminophen (TYLENOL) 500 MG tablet Take 500 mg by mouth daily as needed for moderate pain.   allopurinol (ZYLOPRIM) 300 MG tablet Take 300 mg by mouth daily.   ALPRAZolam (XANAX) 0.25 MG tablet Take 0.25 mg by mouth daily as needed for anxiety.   amLODipine (NORVASC) 5 MG tablet Take 5 mg by mouth every morning.   Ascorbic Acid (VITAMIN C) 1000 MG tablet Take 1,000 mg by mouth daily.   aspirin 81 MG tablet Take 1 tablet (81 mg total) by mouth daily.   atorvastatin (LIPITOR) 20 MG tablet Take 20 mg  by mouth daily.   B-D ULTRAFINE III SHORT PEN 31G X 8 MM MISC Inject 1 application into the skin daily.   BAYER CONTOUR TEST test strip USE 1 STRIP THREE TIMES DAILY TO CHECK BLOOD SUGARS.   Cholecalciferol 25 MCG (1000 UT) capsule Take 10,000 Units by mouth daily.   co-enzyme Q-10 30 MG capsule Take 200 mg by mouth 2 (two) times daily.    Cyanocobalamin (VITAMIN B-12 IJ) Inject 1,000 mcg into the muscle every 30 (thirty) days. Amt/dose is unknown   fish oil-omega-3 fatty acids 1000 MG capsule Take 2 g by mouth daily.   glipiZIDE (GLUCOTROL) 5 MG tablet Take 5 mg by mouth every morning.   hydrochlorothiazide (MICROZIDE) 12.5 MG capsule Take 12.5 mg by mouth daily. Reported on 08/20/2015   HYDROcodone-acetaminophen (VICODIN) 5-500 MG per tablet Take 1 tablet by mouth daily as needed for pain.    isosorbide mononitrate (IMDUR) 30 MG 24 hr tablet Take 1 tablet (30 mg total) by mouth daily. **DO NOT CRUSH**   Magnesium Gluconate (MAGONATE NATAL PO) Take 1 tablet by mouth daily.   metFORMIN (GLUCOPHAGE) 1000 MG tablet Take 1,000 mg by mouth daily with breakfast.   metoprolol (LOPRESSOR) 50  MG tablet Take 50 mg by mouth 2 (two) times daily.   minoxidil (LONITEN) 10 MG tablet Take 10 mg by mouth daily.    Multiple Vitamin (MULTIVITAMIN) tablet Take 1 tablet by mouth daily.   mupirocin ointment (BACTROBAN) 2 % Apply to wound twice a day.   nitroGLYCERIN (NITROSTAT) 0.4 MG SL tablet Place 1 tablet under the tongue as needed.   omeprazole (PRILOSEC) 40 MG capsule Take 40 mg by mouth 2 (two) times daily.   oxyCODONE-acetaminophen (PERCOCET/ROXICET) 5-325 MG tablet Take 1 tablet by mouth daily as needed for moderate pain or severe pain.    oxymetazoline (AFRIN) 0.05 % nasal spray Place 2 sprays into the nose 2 (two) times daily as needed for congestion. Reported on 08/20/2015   TAMSULOSIN HCL PO Take 1 capsule by mouth daily.   trolamine salicylate (ASPERCREME) 10 % cream Apply 1 application topically as  needed for muscle pain.   No current facility-administered medications for this visit. (Other)      REVIEW OF SYSTEMS: ROS   Negative for: Constitutional, Gastrointestinal, Neurological, Skin, Genitourinary, Musculoskeletal, HENT, Endocrine, Cardiovascular, Eyes, Respiratory, Psychiatric, Allergic/Imm, Heme/Lymph Last edited by Hurman Horn, MD on 08/16/2021  2:16 PM.       ALLERGIES Allergies  Allergen Reactions   Shellfish-Derived Products Anaphylaxis   Zolpidem Tartrate Other (See Comments)    Hallucinations    PAST MEDICAL HISTORY Past Medical History:  Diagnosis Date   Allergy    Aortic stenosis    Arthritis    Blood transfusion without reported diagnosis    CAD (coronary artery disease)    Cataract    both eyes surgically removed   Diabetes mellitus, type 2 ([facility])    Diverticulosis of colon (without mention of hemorrhage)    Duodenal ulcer perforation ([place])    blood transfusion   Flesh-eating bacteria ([facility]) 1995   GERD (gastroesophageal reflux disease)    Glaucoma    pre glaucoma on Xalatan drops   Heart murmur    History of diabetic retinopathy    History of gastrointestinal bleeding    History of necrotizing fasciitis    History of prosthetic aortic valve    Hypercholesteremia    Myocardial infarction ([facility]) 02/23/2016   Nightmares    CHRONIC   OSA (obstructive sleep apnea)    borderline no CPAP    Personal history of colonic polyps 11/23/2009   TUBULAR ADENOMA   Postoperative anemia    Sinus bradycardia    Sleep apnea    no cpap   Stomach ulcer    Hx of   Systolic hypertension    Vitamin B12 deficiency    Past Surgical History:  Procedure Laterality Date   AORTIC VALVE REPLACEMENT     BLEPHAROPLASTY  11-24-14   CARDIAC CATHETERIZATION N/A 03/01/2016   Procedure: Coronary/Graft Angiography;  Surgeon: Peter M Martinique, MD;  Location: [facility];  Service: Cardiovascular;  Laterality: N/A;   CATARACT EXTRACTION W/ INTRAOCULAR LENS IMPLANT      OD   COLONOSCOPY     CORONARY ARTERY BYPASS GRAFT  2010   x 4   flesh eating disease surgery      surgeries x 7    HERNIA REPAIR  1972   POLYPECTOMY     RETINAL LASER PROCEDURE     SCROTAL SURGERY  1995   resection   UMBILICAL HERNIA REPAIR N/A 04/10/2013   Procedure: HERNIA REPAIR UMBILICAL ADULT with mesh;  Surgeon: Pedro Earls, MD;  Location: [facility];  Service: General;  Laterality: N/A;    FAMILY HISTORY Family History  Problem Relation Age of Onset   Cirrhosis Father    Alcohol abuse Father        father murdered   Rectal cancer Mother    Diabetes Mother    Colon cancer Mother 82       rectal cancer   Hypertension Mother    Hypertension Sister    Diabetes Sister    Heart attack Neg Hx    Stroke Neg Hx    Colon polyps Neg Hx    Esophageal cancer Neg Hx    Stomach cancer Neg Hx     SOCIAL HISTORY Social History   Tobacco Use   Smoking status: Never   Smokeless tobacco: Never  Vaping Use   Vaping Use: Never used  Substance Use Topics   Alcohol use: No    Alcohol/week: 0.0 standard drinks   Drug use: No         OPHTHALMIC EXAM:  Base Eye Exam     Visual Acuity (ETDRS)       Right Left   Dist [place] 20/20 -2 20/25 +2         Tonometry (Tonopen, 1:59 PM)       Right Left   Pressure 15 15         Pupils       Pupils Dark Light Shape React APD   Right PERRL 3 3 Round Minimal None   Left PERRL 3 3 Round Minimal None         Visual Fields (Counting fingers)       Left Right    Full Full         Extraocular Movement       Right Left    Full, Ortho Full, Ortho         Neuro/Psych     Oriented x3: Yes         Dilation     Both eyes: 1.0% Mydriacyl, 2.5% Phenylephrine @ 1:59 PM           Slit Lamp and Fundus Exam     External Exam       Right Left   External Normal Normal         Slit Lamp Exam       Right Left   Lens Centered posterior chamber intraocular lens Centered posterior chamber intraocular  lens         Fundus Exam       Right Left   Posterior Vitreous Normal Normal   Disc Normal Normal   C/D Ratio 0.5 0.5   Macula Normal Normal   Vessels NPDR- Moderate NPDR- Moderate   Periphery Good peripheral anterior PRP Good peripheral anterior PRP            IMAGING AND PROCEDURES  Imaging and Procedures for 08/16/21  OCT, Retina - OU - Both Eyes       Right Eye Central Foveal Thickness: 265.   Left Eye Central Foveal Thickness: 269.   Notes No active maculopathy OD, incidental posterior vitreous detachment OD, similar OS no active maculopathy with incidental PVD             ASSESSMENT/PLAN:  Primary open-angle glaucoma, bilateral, mild stage Continues on latanoprost 1 drop nightly OU  Pseudophakia, both eyes History of cataract surgery [facility]  Moderate nonproliferative diabetic retinopathy of both eyes without macular edema associated with type 2 diabetes mellitus (  [employer]) History of severe NPDR, but now with stigmata moderate NPDR stabilize likely with peripheral anterior PRP performed years previous to prevent progression to PDR.  Continue to observe follow-up 9 months recommended     ICD-10-CM   1. Moderate nonproliferative diabetic retinopathy of both eyes without macular edema associated with type 2 diabetes mellitus (HCC)  F16.3846 OCT, Retina - OU - Both Eyes    2. Posterior vitreous detachment of both eyes  H43.813     3. Primary open-angle glaucoma, bilateral, mild stage  H40.1131     4. Pseudophakia, both eyes  Z96.1       1.  OU moderate NPDR over time stable and likely remained stable with good peripheral anterior PRP placed when the eyes each demonstrated more severe findings of severe NPDR  2.  Stable OU over time, 3 years from recent most recent dilated eye examination  3.  Continue on topical latanoprost to prevent optic nerve damage over time  Ophthalmic Meds Ordered this visit:  No orders of the defined types were  placed in this encounter.      Return in about 9 months (around 05/16/2022) for DILATE OU, COLOR FP, OCT.  There are no Patient Instructions on file for this visit.   Explained the diagnoses, plan, and follow up with the patient and they expressed understanding.  Patient expressed understanding of the importance of proper follow up care.   Clent Demark  M.D. Diseases & Surgery of the Retina and Vitreous [facility] [place] [place] 08/16/21     Abbreviations: M myopia (nearsighted); A astigmatism; H hyperopia (farsighted); P presbyopia; Mrx spectacle prescription;  CTL contact lenses; OD right eye; OS left eye; OU both eyes  XT exotropia; ET esotropia; PEK punctate epithelial keratitis; PEE punctate epithelial erosions; DES dry eye syndrome; MGD meibomian gland dysfunction; ATs artificial tears; PFAT's preservative free artificial tears; [place] nuclear sclerotic cataract; PSC posterior subcapsular cataract; ERM epi-retinal membrane; PVD posterior vitreous detachment; RD retinal detachment; DM diabetes mellitus; DR diabetic retinopathy; NPDR non-proliferative diabetic retinopathy; PDR proliferative diabetic retinopathy; CSME clinically significant macular edema; DME diabetic macular edema; dbh dot blot hemorrhages; CWS cotton wool spot; POAG primary open angle glaucoma; C/D cup-to-disc ratio; HVF humphrey visual field; GVF goldmann visual field; OCT optical coherence tomography; IOP intraocular pressure; BRVO Branch retinal vein occlusion; CRVO central retinal vein occlusion; CRAO central retinal artery occlusion; BRAO branch retinal artery occlusion; RT retinal tear; SB scleral buckle; PPV pars plana vitrectomy; VH Vitreous hemorrhage; PRP panretinal laser photocoagulation; IVK intravitreal kenalog; VMT vitreomacular traction; MH Macular hole;  NVD neovascularization of the disc; NVE neovascularization elsewhere; AREDS age related eye disease study; ARMD age related macular degeneration; POAG  primary open angle glaucoma; EBMD epithelial/anterior basement membrane dystrophy; ACIOL anterior chamber intraocular lens; IOL intraocular lens; PCIOL posterior chamber intraocular lens; Phaco/IOL phacoemulsification with intraocular lens placement; [place] photorefractive keratectomy; LASIK laser assisted in situ keratomileusis; HTN hypertension; DM diabetes mellitus; COPD chronic obstructive pulmonary disease

## 2021-08-26 DIAGNOSIS — D510 Vitamin B12 deficiency anemia due to intrinsic factor deficiency: Secondary | ICD-10-CM | POA: Diagnosis not present

## 2021-09-30 DIAGNOSIS — D510 Vitamin B12 deficiency anemia due to intrinsic factor deficiency: Secondary | ICD-10-CM | POA: Diagnosis not present

## 2021-11-04 DIAGNOSIS — D510 Vitamin B12 deficiency anemia due to intrinsic factor deficiency: Secondary | ICD-10-CM | POA: Diagnosis not present

## 2021-12-16 DIAGNOSIS — D510 Vitamin B12 deficiency anemia due to intrinsic factor deficiency: Secondary | ICD-10-CM | POA: Diagnosis not present

## 2021-12-23 DIAGNOSIS — W57XXXA Bitten or stung by nonvenomous insect and other nonvenomous arthropods, initial encounter: Secondary | ICD-10-CM | POA: Diagnosis not present

## 2021-12-23 DIAGNOSIS — L03116 Cellulitis of left lower limb: Secondary | ICD-10-CM | POA: Diagnosis not present

## 2021-12-28 DIAGNOSIS — M109 Gout, unspecified: Secondary | ICD-10-CM | POA: Diagnosis not present

## 2021-12-28 DIAGNOSIS — E785 Hyperlipidemia, unspecified: Secondary | ICD-10-CM | POA: Diagnosis not present

## 2021-12-28 DIAGNOSIS — Z125 Encounter for screening for malignant neoplasm of prostate: Secondary | ICD-10-CM | POA: Diagnosis not present

## 2021-12-28 DIAGNOSIS — R7989 Other specified abnormal findings of blood chemistry: Secondary | ICD-10-CM | POA: Diagnosis not present

## 2021-12-28 DIAGNOSIS — D510 Vitamin B12 deficiency anemia due to intrinsic factor deficiency: Secondary | ICD-10-CM | POA: Diagnosis not present

## 2021-12-28 DIAGNOSIS — I10 Essential (primary) hypertension: Secondary | ICD-10-CM | POA: Diagnosis not present

## 2021-12-28 DIAGNOSIS — Z0000 Encounter for general adult medical examination without abnormal findings: Secondary | ICD-10-CM | POA: Diagnosis not present

## 2022-01-04 DIAGNOSIS — I2510 Atherosclerotic heart disease of native coronary artery without angina pectoris: Secondary | ICD-10-CM | POA: Diagnosis not present

## 2022-01-04 DIAGNOSIS — Z1339 Encounter for screening examination for other mental health and behavioral disorders: Secondary | ICD-10-CM | POA: Diagnosis not present

## 2022-01-04 DIAGNOSIS — I739 Peripheral vascular disease, unspecified: Secondary | ICD-10-CM | POA: Diagnosis not present

## 2022-01-04 DIAGNOSIS — I10 Essential (primary) hypertension: Secondary | ICD-10-CM | POA: Diagnosis not present

## 2022-01-04 DIAGNOSIS — E669 Obesity, unspecified: Secondary | ICD-10-CM | POA: Diagnosis not present

## 2022-01-04 DIAGNOSIS — R82998 Other abnormal findings in urine: Secondary | ICD-10-CM | POA: Diagnosis not present

## 2022-01-04 DIAGNOSIS — M109 Gout, unspecified: Secondary | ICD-10-CM | POA: Diagnosis not present

## 2022-01-04 DIAGNOSIS — E785 Hyperlipidemia, unspecified: Secondary | ICD-10-CM | POA: Diagnosis not present

## 2022-01-04 DIAGNOSIS — D510 Vitamin B12 deficiency anemia due to intrinsic factor deficiency: Secondary | ICD-10-CM | POA: Diagnosis not present

## 2022-01-04 DIAGNOSIS — G4733 Obstructive sleep apnea (adult) (pediatric): Secondary | ICD-10-CM | POA: Diagnosis not present

## 2022-01-04 DIAGNOSIS — E1151 Type 2 diabetes mellitus with diabetic peripheral angiopathy without gangrene: Secondary | ICD-10-CM | POA: Diagnosis not present

## 2022-01-04 DIAGNOSIS — Z0000 Encounter for general adult medical examination without abnormal findings: Secondary | ICD-10-CM | POA: Diagnosis not present

## 2022-01-04 DIAGNOSIS — Z1331 Encounter for screening for depression: Secondary | ICD-10-CM | POA: Diagnosis not present

## 2022-01-19 ENCOUNTER — Encounter: Payer: Self-pay | Admitting: Podiatry

## 2022-01-19 ENCOUNTER — Ambulatory Visit (INDEPENDENT_AMBULATORY_CARE_PROVIDER_SITE_OTHER): Payer: Medicare Other | Admitting: Podiatry

## 2022-01-19 DIAGNOSIS — M79675 Pain in left toe(s): Secondary | ICD-10-CM

## 2022-01-19 DIAGNOSIS — B351 Tinea unguium: Secondary | ICD-10-CM | POA: Diagnosis not present

## 2022-01-19 DIAGNOSIS — T148XXA Other injury of unspecified body region, initial encounter: Secondary | ICD-10-CM

## 2022-01-19 DIAGNOSIS — M79674 Pain in right toe(s): Secondary | ICD-10-CM | POA: Diagnosis not present

## 2022-01-19 DIAGNOSIS — S81812A Laceration without foreign body, left lower leg, initial encounter: Secondary | ICD-10-CM

## 2022-01-19 DIAGNOSIS — E119 Type 2 diabetes mellitus without complications: Secondary | ICD-10-CM | POA: Diagnosis not present

## 2022-01-19 DIAGNOSIS — E1142 Type 2 diabetes mellitus with diabetic polyneuropathy: Secondary | ICD-10-CM | POA: Diagnosis not present

## 2022-01-19 DIAGNOSIS — M2041 Other hammer toe(s) (acquired), right foot: Secondary | ICD-10-CM

## 2022-01-19 DIAGNOSIS — M2042 Other hammer toe(s) (acquired), left foot: Secondary | ICD-10-CM

## 2022-01-19 MED ORDER — MUPIROCIN 2 % EX OINT: TOPICAL_OINTMENT | CUTANEOUS | 2 refills | Status: DC

## 2022-01-19 NOTE — Patient Instructions (Signed)
Diabetes Mellitus and Foot Care Foot care is an important part of your health, especially when you have diabetes. Diabetes may cause you to have problems because of poor blood flow (circulation) to your feet and legs, which can cause your skin to: Become thinner and drier. Break more easily. Heal more slowly. Peel and crack. You may also have nerve damage (neuropathy) in your legs and feet, causing decreased feeling in them. This means that you may not notice minor injuries to your feet that could lead to more serious problems. Noticing and addressing any potential problems early is the best way to prevent future foot problems. How to care for your feet Foot hygiene: Wash your feet daily with warm water and mild soap. Do not use hot water. Then, pat your feet and the areas between your toes until they are completely dry. Do not soak your feet as this can dry your skin. Apply a moisturizing lotion or petroleum jelly to the skin on your feet and to dry, brittle toenails. Use lotion that does not contain alcohol and is unscented. Do not apply lotion between your toes. Shoes and socks Wear clean socks or stockings every day. Make sure they are not too tight. Do not wear knee-high stockings since they may decrease blood flow to your legs. Wear shoes that fit properly and have enough cushioning. Always look in your shoes before you put them on to be sure there are no objects inside. To break in new shoes, wear them for just a few hours a day. This prevents injuries on your feet. Wounds, scrapes, corns, and calluses  Check your feet daily for blisters, cuts, bruises, sores, and redness. If you cannot see the bottom of your feet, use a mirror or ask someone for help. Do not cut corns or calluses or try to remove them with medicine. If you find a minor scrape, cut, or break in the skin on your feet, keep it and the skin around it clean and dry. You may clean these areas with mild soap and water. Do not  clean the area with peroxide, alcohol, or iodine. If you have a wound, scrape, corn, or callus on your foot, look at it several times a day to make sure it is healing and not infected. Check for: Redness, swelling, or pain. Fluid or blood. Warmth. Pus or a bad smell. General tips Do not cross your legs. This may decrease blood flow to your feet. Do not use heating pads or hot water bottles on your feet. They may burn your skin. If you have lost feeling in your feet or legs, you may not know this is happening until it is too late. Protect your feet from hot and cold by wearing shoes, such as at the beach or on hot pavement. Schedule a complete foot exam at least once a year (annually) or more often if you have foot problems. Report any cuts, sores, or bruises to your health care provider immediately. Where to find more information American Diabetes Association: www.diabetes.org Association of Diabetes Care & Education Specialists: www.diabeteseducator.org Contact a health care provider if: You have a medical condition that increases your risk of infection and you have any cuts, sores, or bruises on your feet. You have an injury that is not healing. You have redness on your legs or feet. You feel burning or tingling in your legs or feet. You have pain or cramps in your legs and feet. Your legs or feet are numb. Your feet always  feel cold. You have pain around any toenails. Get help right away if: You have a wound, scrape, corn, or callus on your foot and: You have pain, swelling, or redness that gets worse. You have fluid or blood coming from the wound, scrape, corn, or callus. Your wound, scrape, corn, or callus feels warm to the touch. You have pus or a bad smell coming from the wound, scrape, corn, or callus. You have a fever. You have a red line going up your leg. Summary Check your feet every day for blisters, cuts, bruises, sores, and redness. Apply a moisturizing lotion or  petroleum jelly to the skin on your feet and to dry, brittle toenails. Wear shoes that fit properly and have enough cushioning. If you have foot problems, report any cuts, sores, or bruises to your health care provider immediately. Schedule a complete foot exam at least once a year (annually) or more often if you have foot problems. This information is not intended to replace advice given to you by your health care provider. Make sure you discuss any questions you have with your health care provider. Document Revised: 01/16/2020 Document Reviewed: 01/16/2020 Elsevier Patient Education  [place].

## 2022-01-20 DIAGNOSIS — D510 Vitamin B12 deficiency anemia due to intrinsic factor deficiency: Secondary | ICD-10-CM | POA: Diagnosis not present

## 2022-01-24 NOTE — Progress Notes (Signed)
Subjective: Nathan [name] presents today for diabetic foot evaluation.  Patient relates 30 year h/o diabetes. He relates h/o flesh eating bacteria of left flank area which required reconstruction of left groin area. He states he has  h/o insect bites on both lower extremities (April, 2022) and was given a round of oral antibiotics by his PCP   Patient has h/o foot ulcer of left heel.  Patient has been diagnosed with neuropathy.  Patient's blood sugar was 101 mg/dl today. Last known HgA1c was 6.5%.  Risk factors: diabetes, diabetic neuropathy, h/o MI, HTN, CAD, hypercholesterolemia.  PCP is Donnajean Lopes, MD , and last visit was August 26, 2021.  Past Medical History:  Diagnosis Date   Allergy    Aortic stenosis    Arthritis    Blood transfusion without reported diagnosis    CAD (coronary artery disease)    Cataract    both eyes surgically removed   Diabetes mellitus, type 2 ([place])    Diverticulosis of colon (without mention of hemorrhage)    Duodenal ulcer perforation ([place])    blood transfusion   Flesh-eating bacteria ([place]) 1995   GERD (gastroesophageal reflux disease)    Glaucoma    pre glaucoma on Xalatan drops   Heart murmur    History of diabetic retinopathy    History of gastrointestinal bleeding    History of necrotizing fasciitis    History of prosthetic aortic valve    Hypercholesteremia    Myocardial infarction ([place]) 02/23/2016   Nightmares    CHRONIC   OSA (obstructive sleep apnea)    borderline no CPAP    Personal history of colonic polyps 11/23/2009   TUBULAR ADENOMA   Postoperative anemia    Sinus bradycardia    Sleep apnea    no cpap   Stomach ulcer    Hx of   Systolic hypertension    Vitamin B12 deficiency     Patient Active Problem List   Diagnosis Date Noted   Primary open-angle glaucoma, bilateral, mild stage 08/16/2021   Pseudophakia, both eyes 08/16/2021   Moderate nonproliferative diabetic retinopathy of both eyes without macular  edema associated with type 2 diabetes mellitus ([place]) 08/16/2021   Posterior vitreous detachment of both eyes 08/16/2021   DM (diabetes mellitus), secondary, with neurologic complications ([place]) 27/25/3664   Obesity (BMI 30-39.9) 03/15/2016   Abnormal nuclear stress test 40/34/7425    umbilical hernia repair open with Ventralex mesh Oct 2014 04/10/2013   Other general symptoms  01/06/2011   Personal history of colonic polyps 01/06/2011   Iron deficiency anemia 01/06/2011   Gastritis, chronic 01/06/2011   Benign hypertensive heart disease without heart failure 10/13/2010   Hx of CABG 95/63/8756   Systolic hypertension    Sinus bradycardia    CAD (coronary artery disease)    History of prosthetic aortic valve    History of diabetic retinopathy    History of necrotizing fasciitis    Postoperative anemia    Nightmares    B12 DEFICIENCY 12/02/2009   ESOPHAGEAL REFLUX 11/19/2009   CHEST PAIN 11/19/2009   BLOOD IN STOOL, OCCULT 11/19/2009   ANEMIA-IRON DEFICIENCY 08/19/2008   S/P aortic valve replacement with bioprosthetic valve 08/19/2008   GI BLEED 08/19/2008   Type 2 diabetes mellitus with hyperlipidemia ([place]) 08/14/2008   Dyslipidemia 08/14/2008   ANXIETY 08/14/2008   OBSTRUCTIVE SLEEP APNEA 08/14/2008   GLAUCOMA 08/14/2008   Essential hypertension 08/14/2008   PHARYNGITIS 08/14/2008   GASTRIC ULCER 08/14/2008   GASTRITIS 08/14/2008  DIVERTICULOSIS, COLON 08/14/2008    Past Surgical History:  Procedure Laterality Date   AORTIC VALVE REPLACEMENT     BLEPHAROPLASTY  11-24-14   CARDIAC CATHETERIZATION N/A 03/01/2016   Procedure: Coronary/Graft Angiography;  Surgeon: Peter M Martinique, MD;  Location: [facility];  Service: Cardiovascular;  Laterality: N/A;   CATARACT EXTRACTION W/ INTRAOCULAR LENS IMPLANT     OD   COLONOSCOPY     CORONARY ARTERY BYPASS GRAFT  2010   x 4   flesh eating disease surgery      surgeries x 7    HERNIA REPAIR  1972   POLYPECTOMY     RETINAL  LASER PROCEDURE     SCROTAL SURGERY  1995   resection   UMBILICAL HERNIA REPAIR N/A 04/10/2013   Procedure: HERNIA REPAIR UMBILICAL ADULT with mesh;  Surgeon: Pedro Earls, MD;  Location: [facility];  Service: General;  Laterality: N/A;    Current Outpatient Medications on File Prior to Visit  Medication Sig Dispense Refill   acetaminophen (TYLENOL) 500 MG tablet Take 500 mg by mouth daily as needed for moderate pain.     allopurinol (ZYLOPRIM) 300 MG tablet Take 300 mg by mouth daily.     ALPRAZolam (XANAX) 0.25 MG tablet Take 0.25 mg by mouth daily as needed for anxiety.     amLODipine (NORVASC) 5 MG tablet Take 5 mg by mouth every morning.     Ascorbic Acid (VITAMIN C) 1000 MG tablet Take 1,000 mg by mouth daily.     aspirin 81 MG tablet Take 1 tablet (81 mg total) by mouth daily.     atorvastatin (LIPITOR) 20 MG tablet Take 20 mg by mouth daily.  3   B-D ULTRAFINE III SHORT PEN 31G X 8 MM MISC Inject 1 application into the skin daily.  5   BAYER CONTOUR TEST test strip USE 1 STRIP THREE TIMES DAILY TO CHECK BLOOD SUGARS.  11   Cholecalciferol 25 MCG (1000 UT) capsule Take 10,000 Units by mouth daily.     co-enzyme Q-10 30 MG capsule Take 200 mg by mouth 2 (two) times daily.      Cyanocobalamin (VITAMIN B-12 IJ) Inject 1,000 mcg into the muscle every 30 (thirty) days. Amt/dose is unknown     cycloSPORINE (RESTASIS) 0.05 % ophthalmic emulsion Place 1 drop into both eyes 2 (two) times daily.     fish oil-omega-3 fatty acids 1000 MG capsule Take 2 g by mouth daily.     glipiZIDE (GLUCOTROL) 5 MG tablet Take 5 mg by mouth every morning.     hydrochlorothiazide (MICROZIDE) 12.5 MG capsule Take 12.5 mg by mouth daily. Reported on 08/20/2015     HYDROcodone-acetaminophen (VICODIN) 5-500 MG per tablet Take 1 tablet by mouth daily as needed for pain.      isosorbide mononitrate (IMDUR) 30 MG 24 hr tablet Take 1 tablet (30 mg total) by mouth daily. **DO NOT CRUSH** 90 tablet 3   latanoprost  (XALATAN) 0.005 % ophthalmic solution Place 1 drop into both eyes at bedtime.     Magnesium Gluconate (MAGONATE NATAL PO) Take 1 tablet by mouth daily.     metFORMIN (GLUCOPHAGE) 1000 MG tablet Take 1,000 mg by mouth daily with breakfast.     metoprolol (LOPRESSOR) 50 MG tablet Take 50 mg by mouth 2 (two) times daily.     minoxidil (LONITEN) 10 MG tablet Take 10 mg by mouth daily.   5   Multiple Vitamin (MULTIVITAMIN) tablet Take 1  tablet by mouth daily.     nitroGLYCERIN (NITROSTAT) 0.4 MG SL tablet Place 1 tablet under the tongue as needed.  12   omeprazole (PRILOSEC) 40 MG capsule Take 40 mg by mouth 2 (two) times daily.  2   oxyCODONE-acetaminophen (PERCOCET/ROXICET) 5-325 MG tablet Take 1 tablet by mouth daily as needed for moderate pain or severe pain.      oxymetazoline (AFRIN) 0.05 % nasal spray Place 2 sprays into the nose 2 (two) times daily as needed for congestion. Reported on 08/20/2015     TAMSULOSIN HCL PO Take 1 capsule by mouth daily.     trolamine salicylate (ASPERCREME) 10 % cream Apply 1 application topically as needed for muscle pain.     No current facility-administered medications on file prior to visit.     Allergies  Allergen Reactions   Shellfish-Derived Products Anaphylaxis   Zolpidem Tartrate Other (See Comments)    Hallucinations    Social History   Occupational History   Occupation: Scientist, clinical (histocompatibility and immunogenetics): [employer]  Tobacco Use   Smoking status: Never   Smokeless tobacco: Never  Vaping Use   Vaping Use: Never used  Substance and Sexual Activity   Alcohol use: No    Alcohol/week: 0.0 standard drinks of alcohol   Drug use: No   Sexual activity: Not on file    Family History  Problem Relation Age of Onset   Cirrhosis Father    Alcohol abuse Father        father murdered   Rectal cancer Mother    Diabetes Mother    Colon cancer Mother 33       rectal cancer   Hypertension Mother    Hypertension Sister    Diabetes Sister    Heart attack  Neg Hx    Stroke Neg Hx    Colon polyps Neg Hx    Esophageal cancer Neg Hx    Stomach cancer Neg Hx     Immunization History  Administered Date(s) Administered   Influenza Whole 04/07/2010   Influenza,inj,Quad PF,6+ Mos 04/11/2013   PFIZER(Purple Top)SARS-COV-2 Vaccination 04/07/2020    Objective: There were no vitals filed for this visit.  Nathan [name] is a pleasant 76 y.o. male in NAD. AAO X 3.  Vascular Examination: CFT <3 seconds b/l LE. Faintly palpable pedal pulses b/l LE. Pedal hair absent b/l LE. Skin temperature gradient WNL b/l. No pain with calf compression b/l. No edema b/l LE. No cyanosis or clubbing noted b/l LE.  Dermatological Examination: Pedal skin is warm and supple b/l LE. No open wounds b/l LE. No interdigital macerations noted b/l LE. Toenails 1-5 b/l elongated, discolored, dystrophic, thickened, crumbly with subungual debris and tenderness to dorsal palpation.  Longitudinal excoriation posterolateral aspect left ankle.  Neurological Examination: Protective sensation diminished with 10g monofilament b/l. Vibratory sensation diminished b/l.  Musculoskeletal Examination: Muscle strength 5/5 to all lower extremity muscle groups bilaterally. No pain, crepitus or joint limitation noted with ROM bilateral LE. Hammertoe deformity noted 2-5 b/l.  Footwear Assessment: Does the patient wear appropriate shoes? Yes. Does the patient need inserts/orthotics? No.  Assessment: 1. Pain due to onychomycosis of toenails of both feet   2. Skin excoriation   3. Acquired hammertoes of both feet   4. Diabetic peripheral neuropathy associated with type 2 diabetes mellitus ([place])   5. Encounter for diabetic foot exam ([place])      ADA Risk Categorization:  High Risk:  Patient has one or more of  the following: Loss of protective sensation Absent pedal pulses Severe Foot deformity History of foot ulcer  Plan: -Patient was evaluated and treated. All patient's and/or  POA's questions/concerns answered on today's visit. -Triple antibiotic ointment and band-aid applied to excoriation left ankle. Rx written for Mupirocin Ointment for once daily application to area until he follows up with Dr. Blenda Mounts. -Diabetic foot examination performed today. -Stressed the importance of good glycemic control and the detriment of not  controlling glucose levels in relation to the foot. -Patient to continue soft, supportive shoe gear daily. -Mycotic toenails 1-5 bilaterally were debrided in length and girth with sterile nail nippers and dremel without incident. -Patient scheduled to see Dr. Lorenda Peck in two weeks for follow up of excoriation left ankle. -Patient/POA to call should there be question/concern in the interim.  Return in about 2 weeks (around 02/02/2022).  Marzetta Board, DPM

## 2022-02-02 ENCOUNTER — Encounter: Payer: Self-pay | Admitting: Podiatry

## 2022-02-02 ENCOUNTER — Ambulatory Visit (INDEPENDENT_AMBULATORY_CARE_PROVIDER_SITE_OTHER): Payer: Medicare Other | Admitting: Podiatry

## 2022-02-02 DIAGNOSIS — E1142 Type 2 diabetes mellitus with diabetic polyneuropathy: Secondary | ICD-10-CM

## 2022-02-02 DIAGNOSIS — T148XXA Other injury of unspecified body region, initial encounter: Secondary | ICD-10-CM

## 2022-02-02 NOTE — Progress Notes (Signed)
  Subjective:  Patient ID: Nathan [name], male    DOB: 1946-07-10,   MRN: [id number]  Chief Complaint  Patient presents with   Wound Check     2 wk wound check - patient states a new wound a develop next to the first one that is painful.    76 y.o. male presents for follow-up of excoriation on his ankle. Was seen by Dr. Elisha Ponder a couple weeks ago and this was noted at the [facility] visit. Relates a new wound has developed next to the first one that has been painful. Has been dressing with mupirocin.  Denies any other pedal complaints. Denies n/v/f/c.   Past Medical History:  Diagnosis Date   Allergy    Aortic stenosis    Arthritis    Blood transfusion without reported diagnosis    CAD (coronary artery disease)    Cataract    both eyes surgically removed   Diabetes mellitus, type 2 ([place])    Diverticulosis of colon (without mention of hemorrhage)    Duodenal ulcer perforation ([place])    blood transfusion   Flesh-eating bacteria ([place]) 1995   GERD (gastroesophageal reflux disease)    Glaucoma    pre glaucoma on Xalatan drops   Heart murmur    History of diabetic retinopathy    History of gastrointestinal bleeding    History of necrotizing fasciitis    History of prosthetic aortic valve    Hypercholesteremia    Myocardial infarction ([place]) 02/23/2016   Nightmares    CHRONIC   OSA (obstructive sleep apnea)    borderline no CPAP    Personal history of colonic polyps 11/23/2009   TUBULAR ADENOMA   Postoperative anemia    Sinus bradycardia    Sleep apnea    no cpap   Stomach ulcer    Hx of   Systolic hypertension    Vitamin B12 deficiency     Objective:  Physical Exam: Vascular: DP/PT pulses 2/4 bilateral. CFT <3 seconds. Absent hair growth on digits. Edema noted to bilateral lower extremities. Xerosis noted bilaterally.  Skin. No lacerations or abrasions bilateral feet. Nails 1-5 bilateral  are thickened discolored and elongated with subungual debris.  Left ankle with excoriation  noted laterally. Previous area is healed. No erythema edema or purulence noted.  Musculoskeletal: MMT 5/5 bilateral lower extremities in DF, PF, Inversion and Eversion. Deceased ROM in DF of ankle joint.  Neurological: Sensation intact to light touch. Protective sensation diminished bilateral.    Assessment:   1. Skin excoriation   2. Diabetic peripheral neuropathy associated with type 2 diabetes mellitus ([place])      Plan:  Patient was evaluated and treated and all questions answered. Excoriation noted to lateral ankle nearly healed.  Previous one healed.  No debridement necessary  -Dressed with silvadene and bandaid. -No abx indicated.  -Discussed glucose control and proper protein-rich diet.  -Discussed if any worsening redness, pain, fever or chills to call or may need to report to the emergency room. Patient expressed understanding.  Will follow up in 4 weeks for recheck.   No follow-ups on file.   Lorenda Peck, DPM

## 2022-02-24 DIAGNOSIS — D510 Vitamin B12 deficiency anemia due to intrinsic factor deficiency: Secondary | ICD-10-CM | POA: Diagnosis not present

## 2022-03-02 ENCOUNTER — Encounter: Payer: Self-pay | Admitting: Podiatry

## 2022-03-02 ENCOUNTER — Ambulatory Visit (INDEPENDENT_AMBULATORY_CARE_PROVIDER_SITE_OTHER): Payer: Medicare Other | Admitting: Podiatry

## 2022-03-02 DIAGNOSIS — E1142 Type 2 diabetes mellitus with diabetic polyneuropathy: Secondary | ICD-10-CM

## 2022-03-02 DIAGNOSIS — T148XXA Other injury of unspecified body region, initial encounter: Secondary | ICD-10-CM

## 2022-03-02 NOTE — Progress Notes (Signed)
  Subjective:  Patient ID: Nathan [name], male    DOB: 05-26-1946,   MRN: [id number]  Chief Complaint  Patient presents with   Wound Check    Wound check    76 y.o. male presents for follow-up of excoriation on his ankle. Relates doing well.  Has been dressing with mupirocin and bandaid.  Denies any other pedal complaints. Denies n/v/f/c.   Past Medical History:  Diagnosis Date   Allergy    Aortic stenosis    Arthritis    Blood transfusion without reported diagnosis    CAD (coronary artery disease)    Cataract    both eyes surgically removed   Diabetes mellitus, type 2 ([place])    Diverticulosis of colon (without mention of hemorrhage)    Duodenal ulcer perforation ([place])    blood transfusion   Flesh-eating bacteria ([place]) 1995   GERD (gastroesophageal reflux disease)    Glaucoma    pre glaucoma on Xalatan drops   Heart murmur    History of diabetic retinopathy    History of gastrointestinal bleeding    History of necrotizing fasciitis    History of prosthetic aortic valve    Hypercholesteremia    Myocardial infarction ([place]) 02/23/2016   Nightmares    CHRONIC   OSA (obstructive sleep apnea)    borderline no CPAP    Personal history of colonic polyps 11/23/2009   TUBULAR ADENOMA   Postoperative anemia    Sinus bradycardia    Sleep apnea    no cpap   Stomach ulcer    Hx of   Systolic hypertension    Vitamin B12 deficiency     Objective:  Physical Exam: Vascular: DP/PT pulses 2/4 bilateral. CFT <3 seconds. Absent hair growth on digits. Edema noted to bilateral lower extremities. Xerosis noted bilaterally.  Skin. No lacerations or abrasions bilateral feet. Nails 1-5 bilateral  are thickened discolored and elongated with subungual debris.  Left ankle with excoriation healed  Previous area is healed. No erythema edema or purulence noted.  Musculoskeletal: MMT 5/5 bilateral lower extremities in DF, PF, Inversion and Eversion. Deceased ROM in DF of ankle joint.   Neurological: Sensation intact to light touch. Protective sensation diminished bilateral.    Assessment:   1. Skin excoriation   2. Diabetic peripheral neuropathy associated with type 2 diabetes mellitus ([place])       Plan:  Patient was evaluated and treated and all questions answered. Excoriation noted to lateral ankle healed.  Previous one healed.  No debridement necessary  -Dressed with bandaid.  -No abx indicated.  -Discussed glucose control and proper protein-rich diet.  -Discussed if any worsening redness, pain, fever or chills to call or may need to report to the emergency room. Patient expressed understanding.  Follow-up as scheduled with Dr. Elisha Ponder.   Return if symptoms worsen or fail to improve.   Lorenda Peck, DPM

## 2022-03-31 DIAGNOSIS — D510 Vitamin B12 deficiency anemia due to intrinsic factor deficiency: Secondary | ICD-10-CM | POA: Diagnosis not present

## 2022-04-14 DIAGNOSIS — Z23 Encounter for immunization: Secondary | ICD-10-CM | POA: Diagnosis not present

## 2022-04-26 ENCOUNTER — Encounter: Payer: Self-pay | Admitting: Podiatry

## 2022-04-26 ENCOUNTER — Ambulatory Visit (INDEPENDENT_AMBULATORY_CARE_PROVIDER_SITE_OTHER): Payer: Medicare Other | Admitting: Podiatry

## 2022-04-26 DIAGNOSIS — E1142 Type 2 diabetes mellitus with diabetic polyneuropathy: Secondary | ICD-10-CM | POA: Diagnosis not present

## 2022-04-26 DIAGNOSIS — M79675 Pain in left toe(s): Secondary | ICD-10-CM

## 2022-04-26 DIAGNOSIS — R2689 Other abnormalities of gait and mobility: Secondary | ICD-10-CM

## 2022-04-26 DIAGNOSIS — B351 Tinea unguium: Secondary | ICD-10-CM

## 2022-04-26 DIAGNOSIS — M79674 Pain in right toe(s): Secondary | ICD-10-CM | POA: Diagnosis not present

## 2022-04-26 DIAGNOSIS — M792 Neuralgia and neuritis, unspecified: Secondary | ICD-10-CM | POA: Diagnosis not present

## 2022-04-26 HISTORY — DX: Other abnormalities of gait and mobility: R26.89

## 2022-04-26 HISTORY — DX: Neuralgia and neuritis, unspecified: M79.2

## 2022-04-26 NOTE — Progress Notes (Signed)
  Subjective:  Patient ID: Nathan [name], male    DOB: Jan 07, 1946,  MRN: [id number]  Nathan [name] presents to clinic today for:  Chief Complaint  Patient presents with   Nail Problem    Diabetic foot care BS-110 A1C-6.7 PCP-Daniel Philip Aspen PCP VST-4 months ago   New problem(s):  Patient states his neuropathy is getting worse. He is experiencing cramps at night. Patient also states he loses balance while in the shower and is afraid he may fall.  PCP is Donnajean Lopes, MD.  Allergies  Allergen Reactions   Shellfish-Derived Products Anaphylaxis   Zolpidem Tartrate Other (See Comments)    Hallucinations    Review of Systems: Negative except as noted in the HPI.  Objective:  Nathan [name] is a pleasant 76 y.o. male WD, WN in NAD. AAO x 3.  Vascular Examination: CFT <3 seconds b/l LE. Faintly palpable pedal pulses b/l LE. Pedal hair absent b/l LE. Skin temperature gradient WNL b/l. No pain with calf compression b/l. No edema b/l LE. No cyanosis or clubbing noted b/l LE.  Dermatological Examination: Pedal skin is warm and supple b/l LE. No open wounds b/l LE. No interdigital macerations noted b/l LE. Toenails 1-5 b/l elongated, discolored, dystrophic, thickened, crumbly with subungual debris and tenderness to dorsal palpation.  Neurological Examination: Protective sensation diminished with 10g monofilament b/l. Vibratory sensation diminished b/l.  Musculoskeletal Examination: Muscle strength 5/5 to all lower extremity muscle groups bilaterally. No pain, crepitus or joint limitation noted with ROM bilateral LE. Hammertoe deformity noted 2-5 b/l.  Assessment/Plan: 1. Pain due to onychomycosis of toenails of both feet   2. Balance problem   3. Neuropathic pain of foot, unspecified laterality   4. Diabetic peripheral neuropathy associated with type 2 diabetes mellitus ([place])     No orders of the defined types were placed in this encounter.   -Patient was evaluated and  treated. All patient's and/or POA's questions/concerns answered on today's visit. -Discussed balance issues and neuropathic pain. Discussed treatment options. He is interested in physical therapy. I will fax order to [facility] for balance and gait training and modalities for neuropathic pain. -Continue diabetic foot care principles: inspect feet daily, monitor glucose as recommended by PCP and/or Endocrinologist, and follow prescribed diet per PCP, Endocrinologist and/or dietician. -Patient to continue soft, supportive shoe gear daily. -Toenails 1-5 b/l were debrided in length and girth with sterile nail nippers and dremel without iatrogenic bleeding.  -Patient/POA to call should there be question/concern in the interim.   Return in about 3 months (around 07/27/2022).  Marzetta Board, DPM

## 2022-05-05 DIAGNOSIS — D510 Vitamin B12 deficiency anemia due to intrinsic factor deficiency: Secondary | ICD-10-CM | POA: Diagnosis not present

## 2022-05-06 DIAGNOSIS — R262 Difficulty in walking, not elsewhere classified: Secondary | ICD-10-CM | POA: Diagnosis not present

## 2022-05-06 DIAGNOSIS — R531 Weakness: Secondary | ICD-10-CM | POA: Diagnosis not present

## 2022-05-09 DIAGNOSIS — I739 Peripheral vascular disease, unspecified: Secondary | ICD-10-CM | POA: Diagnosis not present

## 2022-05-09 DIAGNOSIS — I2510 Atherosclerotic heart disease of native coronary artery without angina pectoris: Secondary | ICD-10-CM | POA: Diagnosis not present

## 2022-05-09 DIAGNOSIS — H6123 Impacted cerumen, bilateral: Secondary | ICD-10-CM | POA: Diagnosis not present

## 2022-05-09 DIAGNOSIS — I10 Essential (primary) hypertension: Secondary | ICD-10-CM | POA: Diagnosis not present

## 2022-05-09 DIAGNOSIS — E1151 Type 2 diabetes mellitus with diabetic peripheral angiopathy without gangrene: Secondary | ICD-10-CM | POA: Diagnosis not present

## 2022-05-09 DIAGNOSIS — F5101 Primary insomnia: Secondary | ICD-10-CM | POA: Diagnosis not present

## 2022-05-11 DIAGNOSIS — R531 Weakness: Secondary | ICD-10-CM | POA: Diagnosis not present

## 2022-05-11 DIAGNOSIS — R262 Difficulty in walking, not elsewhere classified: Secondary | ICD-10-CM | POA: Diagnosis not present

## 2022-05-13 DIAGNOSIS — R531 Weakness: Secondary | ICD-10-CM | POA: Diagnosis not present

## 2022-05-13 DIAGNOSIS — R262 Difficulty in walking, not elsewhere classified: Secondary | ICD-10-CM | POA: Diagnosis not present

## 2022-05-16 ENCOUNTER — Encounter (INDEPENDENT_AMBULATORY_CARE_PROVIDER_SITE_OTHER): Payer: Medicare Other | Admitting: Ophthalmology

## 2022-05-16 DIAGNOSIS — H43813 Vitreous degeneration, bilateral: Secondary | ICD-10-CM | POA: Diagnosis not present

## 2022-05-16 DIAGNOSIS — E113493 Type 2 diabetes mellitus with severe nonproliferative diabetic retinopathy without macular edema, bilateral: Secondary | ICD-10-CM | POA: Diagnosis not present

## 2022-05-18 DIAGNOSIS — S2232XA Fracture of one rib, left side, initial encounter for closed fracture: Secondary | ICD-10-CM | POA: Diagnosis not present

## 2022-05-18 DIAGNOSIS — W108XXA Fall (on) (from) other stairs and steps, initial encounter: Secondary | ICD-10-CM | POA: Diagnosis not present

## 2022-05-18 DIAGNOSIS — M546 Pain in thoracic spine: Secondary | ICD-10-CM | POA: Diagnosis not present

## 2022-05-18 DIAGNOSIS — R0781 Pleurodynia: Secondary | ICD-10-CM | POA: Diagnosis not present

## 2022-06-09 DIAGNOSIS — D510 Vitamin B12 deficiency anemia due to intrinsic factor deficiency: Secondary | ICD-10-CM | POA: Diagnosis not present

## 2022-06-10 DIAGNOSIS — L814 Other melanin hyperpigmentation: Secondary | ICD-10-CM | POA: Diagnosis not present

## 2022-06-10 DIAGNOSIS — L570 Actinic keratosis: Secondary | ICD-10-CM | POA: Diagnosis not present

## 2022-06-10 DIAGNOSIS — D0462 Carcinoma in situ of skin of left upper limb, including shoulder: Secondary | ICD-10-CM | POA: Diagnosis not present

## 2022-06-10 DIAGNOSIS — L821 Other seborrheic keratosis: Secondary | ICD-10-CM | POA: Diagnosis not present

## 2022-06-10 DIAGNOSIS — L218 Other seborrheic dermatitis: Secondary | ICD-10-CM | POA: Diagnosis not present

## 2022-06-10 DIAGNOSIS — D2262 Melanocytic nevi of left upper limb, including shoulder: Secondary | ICD-10-CM | POA: Diagnosis not present

## 2022-06-13 DIAGNOSIS — R262 Difficulty in walking, not elsewhere classified: Secondary | ICD-10-CM | POA: Diagnosis not present

## 2022-06-13 DIAGNOSIS — R531 Weakness: Secondary | ICD-10-CM | POA: Diagnosis not present

## 2022-06-20 DIAGNOSIS — R531 Weakness: Secondary | ICD-10-CM | POA: Diagnosis not present

## 2022-06-20 DIAGNOSIS — R262 Difficulty in walking, not elsewhere classified: Secondary | ICD-10-CM | POA: Diagnosis not present

## 2022-06-23 DIAGNOSIS — D0462 Carcinoma in situ of skin of left upper limb, including shoulder: Secondary | ICD-10-CM | POA: Diagnosis not present

## 2022-06-23 DIAGNOSIS — R262 Difficulty in walking, not elsewhere classified: Secondary | ICD-10-CM | POA: Diagnosis not present

## 2022-06-23 DIAGNOSIS — R531 Weakness: Secondary | ICD-10-CM | POA: Diagnosis not present

## 2022-06-27 DIAGNOSIS — R262 Difficulty in walking, not elsewhere classified: Secondary | ICD-10-CM | POA: Diagnosis not present

## 2022-06-27 DIAGNOSIS — R531 Weakness: Secondary | ICD-10-CM | POA: Diagnosis not present

## 2022-06-30 DIAGNOSIS — R262 Difficulty in walking, not elsewhere classified: Secondary | ICD-10-CM | POA: Diagnosis not present

## 2022-06-30 DIAGNOSIS — R531 Weakness: Secondary | ICD-10-CM | POA: Diagnosis not present

## 2022-07-05 DIAGNOSIS — R262 Difficulty in walking, not elsewhere classified: Secondary | ICD-10-CM | POA: Diagnosis not present

## 2022-07-05 DIAGNOSIS — R531 Weakness: Secondary | ICD-10-CM | POA: Diagnosis not present

## 2022-07-08 DIAGNOSIS — R531 Weakness: Secondary | ICD-10-CM | POA: Diagnosis not present

## 2022-07-08 DIAGNOSIS — R262 Difficulty in walking, not elsewhere classified: Secondary | ICD-10-CM | POA: Diagnosis not present

## 2022-07-14 DIAGNOSIS — R262 Difficulty in walking, not elsewhere classified: Secondary | ICD-10-CM | POA: Diagnosis not present

## 2022-07-14 DIAGNOSIS — R531 Weakness: Secondary | ICD-10-CM | POA: Diagnosis not present

## 2022-07-19 DIAGNOSIS — R531 Weakness: Secondary | ICD-10-CM | POA: Diagnosis not present

## 2022-07-19 DIAGNOSIS — R262 Difficulty in walking, not elsewhere classified: Secondary | ICD-10-CM | POA: Diagnosis not present

## 2022-07-20 ENCOUNTER — Ambulatory Visit: Payer: Medicare Other | Admitting: Cardiology

## 2022-07-20 ENCOUNTER — Ambulatory Visit: Payer: Medicare Other | Attending: Cardiology | Admitting: Cardiology

## 2022-07-20 ENCOUNTER — Other Ambulatory Visit: Payer: Self-pay | Admitting: Nurse Practitioner

## 2022-07-20 ENCOUNTER — Encounter: Payer: Self-pay | Admitting: Cardiology

## 2022-07-20 VITALS — BP 138/68 | HR 87 | Ht 67.0 in | Wt 161.0 lb

## 2022-07-20 DIAGNOSIS — I10 Essential (primary) hypertension: Secondary | ICD-10-CM | POA: Diagnosis not present

## 2022-07-20 DIAGNOSIS — Z952 Presence of prosthetic heart valve: Secondary | ICD-10-CM

## 2022-07-20 DIAGNOSIS — I25810 Atherosclerosis of coronary artery bypass graft(s) without angina pectoris: Secondary | ICD-10-CM

## 2022-07-20 DIAGNOSIS — I5032 Chronic diastolic (congestive) heart failure: Secondary | ICD-10-CM | POA: Insufficient documentation

## 2022-07-20 NOTE — Patient Instructions (Signed)
Medication Instructions:  No changes *If you need a refill on your cardiac medications before your next appointment, please call your pharmacy*   Lab Work: none If you have labs (blood work) drawn today and your tests are completely normal, you will receive your results only by: [place] (if you have MyChart) OR A paper copy in the mail If you have any lab test that is abnormal or we need to change your treatment, we will call you to review the results.   Testing/Procedures: none   Follow-Up: At [facility], you and your health needs are our priority.  As part of our continuing mission to provide you with exceptional heart care, we have created designated Provider Care Teams.  These Care Teams include your primary Cardiologist (physician) and Advanced Practice Providers (APPs -  Physician Assistants and Nurse Practitioners) who all work together to provide you with the care you need, when you need it.   Your next appointment:   12 month(s)  Provider:   Candee Furbish, MD

## 2022-07-20 NOTE — Progress Notes (Signed)
Date:  07/20/2022   ID:  Nathan [name], DOB 1945/09/07, MRN [id number]   PCP:  Donnajean Lopes, MD  Cardiologist:  Candee Furbish, MD  Electrophysiologist:  None   Evaluation Performed:  Follow-Up Visit   History of Present Illness:    Nathan [name] is a 77 y.o. male here for the follow-up of coronary artery disease status post CABG, aortic valve replacement in 2010, April.  Tissue valve.  Last ER visit was in 2019 after he was stung by a wasp on his fingertip.  He felt some chest discomfort at that time but ibuprofen helped release the pain.  On 03/01/2016 underwent cardiac catheterization which demonstrated SVG to RCA was the only occluded graft.  Medical management.  Nuclear stress test preceding the catheterization showed an anterior lateral defect.  Reviewed.  Stated in the past that isosorbide gave him more energy.  Has been compliant with his medications.  In 1994 he had a "flesh eating virus "and Dr. Risa Grill help save his life.  He was previously stationed in [place] in 1968.  Claustrophobia.  He enjoyed the [employer] on country music.  He states that he was friends with Amy Bryson Corona.  He is a Warehouse manager.   Today: He recently fell, broke 4 ribs. Fell back. Was taking PT for balance.     Past Medical History:  Diagnosis Date   Allergy    Aortic stenosis    Arthritis    Balance problem 04/26/2022   Blood transfusion without reported diagnosis    CAD (coronary artery disease)    Cataract    both eyes surgically removed   Diabetes mellitus, type 2 ([place])    Diverticulosis of colon (without mention of hemorrhage)    Duodenal ulcer perforation ([place])    blood transfusion   Flesh-eating bacteria ([place]) 1995   GERD (gastroesophageal reflux disease)    Glaucoma    pre glaucoma on Xalatan drops   Heart murmur    History of diabetic retinopathy    History of gastrointestinal bleeding    History of necrotizing fasciitis    History of  prosthetic aortic valve    Hypercholesteremia    Myocardial infarction ([place]) 02/23/2016   Neuropathic pain 04/26/2022   Nightmares    CHRONIC   OSA (obstructive sleep apnea)    borderline no CPAP    Personal history of colonic polyps 11/23/2009   TUBULAR ADENOMA   Postoperative anemia    Sinus bradycardia    Sleep apnea    no cpap   Stomach ulcer    Hx of   Systolic hypertension    Vitamin B12 deficiency    Past Surgical History:  Procedure Laterality Date   AORTIC VALVE REPLACEMENT     BLEPHAROPLASTY  11-24-14   CARDIAC CATHETERIZATION N/A 03/01/2016   Procedure: Coronary/Graft Angiography;  Surgeon: Peter M Martinique, MD;  Location: [facility];  Service: Cardiovascular;  Laterality: N/A;   CATARACT EXTRACTION W/ INTRAOCULAR LENS IMPLANT     OD   COLONOSCOPY     CORONARY ARTERY BYPASS GRAFT  2010   x 4   flesh eating disease surgery      surgeries x 7    HERNIA REPAIR  1972   POLYPECTOMY     RETINAL LASER PROCEDURE     SCROTAL SURGERY  1995   resection   UMBILICAL HERNIA REPAIR N/A 04/10/2013   Procedure: HERNIA REPAIR UMBILICAL ADULT with mesh;  Surgeon: Pedro Earls,  MD;  Location: [facility];  Service: General;  Laterality: N/A;     Current Meds  Medication Sig   acetaminophen (TYLENOL) 500 MG tablet Take 500 mg by mouth daily as needed for moderate pain.   allopurinol (ZYLOPRIM) 300 MG tablet Take 300 mg by mouth daily.   ALPRAZolam (XANAX) 0.25 MG tablet Take 0.25 mg by mouth daily as needed for anxiety.   amLODipine (NORVASC) 5 MG tablet Take 5 mg by mouth every morning.   Ascorbic Acid (VITAMIN C) 1000 MG tablet Take 1,000 mg by mouth daily.   aspirin 81 MG tablet Take 1 tablet (81 mg total) by mouth daily.   atorvastatin (LIPITOR) 20 MG tablet Take 20 mg by mouth daily.   BAYER CONTOUR TEST test strip USE 1 STRIP THREE TIMES DAILY TO CHECK BLOOD SUGARS.   Cholecalciferol 25 MCG (1000 UT) capsule Take 10,000 Units by mouth daily.   co-enzyme Q-10 30 MG  capsule Take 200 mg by mouth 2 (two) times daily.    Cyanocobalamin (VITAMIN B-12 IJ) Inject 1,000 mcg into the muscle every 30 (thirty) days. Amt/dose is unknown   cycloSPORINE (RESTASIS) 0.05 % ophthalmic emulsion Place 1 drop into both eyes 2 (two) times daily.   fish oil-omega-3 fatty acids 1000 MG capsule Take 2 g by mouth daily.   fluorouracil (EFUDEX) 5 % cream Apply topically daily.   glipiZIDE (GLUCOTROL) 5 MG tablet Take 5 mg by mouth every morning.   hydrochlorothiazide (MICROZIDE) 12.5 MG capsule Take 12.5 mg by mouth daily. Reported on 08/20/2015   HYDROcodone-acetaminophen (VICODIN) 5-500 MG per tablet Take 1 tablet by mouth daily as needed for pain.    isosorbide mononitrate (IMDUR) 30 MG 24 hr tablet TAKE 1 TABLET (30 MG TOTAL) BY MOUTH DAILY. **DO NOT CRUSH**   ketoconazole (NIZORAL) 2 % cream 2 (two) times daily.   latanoprost (XALATAN) 0.005 % ophthalmic solution Place 1 drop into both eyes at bedtime.   meloxicam (MOBIC) 15 MG tablet Take 15 mg by mouth as needed for pain.   metFORMIN (GLUCOPHAGE) 1000 MG tablet Take 1,000 mg by mouth daily with breakfast.   metoprolol (LOPRESSOR) 50 MG tablet Take 50 mg by mouth 2 (two) times daily.   minoxidil (LONITEN) 10 MG tablet Take 10 mg by mouth daily.    Multiple Vitamin (MULTIVITAMIN) tablet Take 1 tablet by mouth daily.   mupirocin ointment (BACTROBAN) 2 % Apply to left ankle once daily.   nitroGLYCERIN (NITROSTAT) 0.4 MG SL tablet Place 1 tablet under the tongue as needed.   omeprazole (PRILOSEC) 40 MG capsule Take 40 mg by mouth 2 (two) times daily.   oxyCODONE-acetaminophen (PERCOCET/ROXICET) 5-325 MG tablet Take 1 tablet by mouth daily as needed for moderate pain or severe pain.    oxymetazoline (AFRIN) 0.05 % nasal spray Place 2 sprays into the nose 2 (two) times daily as needed for congestion. Reported on 12/15/8936   trolamine salicylate (ASPERCREME) 10 % cream Apply 1 application topically as needed for muscle pain.      Allergies:   Shellfish-derived products and Zolpidem tartrate   Social History   Tobacco Use   Smoking status: Never   Smokeless tobacco: Never  Vaping Use   Vaping Use: Never used  Substance Use Topics   Alcohol use: No    Alcohol/week: 0.0 standard drinks of alcohol   Drug use: No     Family Hx: The patient's family history includes Alcohol abuse in his father; Cirrhosis in his father; Colon  cancer (age of onset: 66) in his mother; Diabetes in his mother and sister; Hypertension in his mother and sister; Rectal cancer in his mother. There is no history of Heart attack, Stroke, Colon polyps, Esophageal cancer, or Stomach cancer.  ROS:   Please see the history of present illness.     All other systems reviewed and are negative.   Prior CV studies:   The following studies were reviewed today: ECHO 2022:   1. Left ventricular ejection fraction, by estimation, is 60 to 65%. The  left ventricle has normal function. The left ventricle has no regional  wall motion abnormalities. There is mild concentric left ventricular  hypertrophy. Left ventricular diastolic  parameters are consistent with Grade II diastolic dysfunction  (pseudonormalization). Elevated left ventricular end-diastolic pressure.   2. Right ventricular systolic function is normal. The right ventricular  size is normal. Tricuspid regurgitation signal is inadequate for assessing  PA pressure.   3. Left atrial size was moderately dilated.   4. The mitral valve is normal in structure. Trivial mitral valve  regurgitation. No evidence of mitral stenosis. Moderate mitral annular  calcification.   5. The aortic valve has been repaired/replaced. Aortic valve  regurgitation is not visualized. No aortic stenosis is present. Aortic  valve mean gradient measures 12.0 mmHg. Aortic valve Vmax measures 2.52  m/s.   6. Aortic dilatation noted. There is mild dilatation of the ascending  aorta, measuring 37 mm. There is mild  dilatation of the aortic root,  measuring 38 mm.   7. The inferior vena cava is normal in size with greater than 50%  respiratory variability, suggesting right atrial pressure of 3 mmHg.    Echocardiogram 11/09/2016 -EF 60% pseudonormal diastolic dysfunction.  Bioprosthetic aortic valve functioning normally.  Mildly elevated mean gradient of 19 mmHg.  No AI.   CABG op report 10/20/08: Coronary artery bypass grafting x3 with the left  internal mammary to the left anterior descending coronary artery, reverse saphenous vein graft to circumflex coronary artery, reverse saphenous vein graft to the posterior descending coronary artery and aortic valve replacement with a pericardial tissue valve.  [employer], [name], 23-mm, serial number [id number], with bilateral thigh endovein harvesting.   Cath 03/01/16: Mid RCA lesion, 50 %stenosed. Mid RCA to Dist RCA lesion, 100 %stenosed. Prox Cx to Mid Cx lesion, 99 %stenosed. Prox LAD to Mid LAD lesion, 100 %stenosed. SVG to RCA is occluded Origin lesion, 100 %stenosed. SVG to OM2 is widely patent. LIMA and is normal in caliber and anatomically normal.   1. Severe 3 vessel occlusive CAD 2. Patent LIMA to the LAD 3. Patent SVG to large OM2 4. Occluded SVG to PDA. Distal RCA is occluded and fills by left to right collaterals.    Plan: recommend medical management.   Labs/Other Tests and Data Reviewed:    EKG: EKG 07/20/2022 shows sinus rhythm 87 with PVC left axis deviation LVH.   2021 showed sinus rhythm with PAC heart rate 73 bpm  Recent Labs: No results found for requested labs within last 365 days.   Recent Lipid Panel Lab Results  Component Value Date/Time   CHOL 101 07/20/2021 08:46 AM   TRIG 184 (H) 07/20/2021 08:46 AM   HDL 24 (L) 07/20/2021 08:46 AM   CHOLHDL 4.2 07/20/2021 08:46 AM   LDLCALC 46 07/20/2021 08:46 AM    Wt Readings from Last 3 Encounters:  07/20/22 161 lb (73 kg)  07/20/21 169 lb 6.4 oz (76.8 kg)   08/03/20  171 lb (77.6 kg)     Risk Assessment/Calculations:     Objective:    Vital Signs:  BP 138/68   Pulse 87   Ht '5\' 7"'$  (1.702 m)   Wt 161 lb (73 kg)   SpO2 97%   BMI 25.22 kg/m    General alert and oriented x3 no acute distress no respiratory distress noted able to complete full sentences without difficulty, regular rate and rhythm with occasional ectopy, soft systolic murmur heard.  ASSESSMENT & PLAN:    Coronary artery disease status post CABG -Continue with isosorbide medical management aspirin statin secondary prevention strategy with goal-directed medical therapy. -No anginal symptoms, continue with exercise.  No changes made to therapy.  Aortic valve replacement -Bioprosthetic functioning normally in 2022 on echocardiogram  -Continue with dental prophylaxis -Repeat echocardiogram likely in 1 year  Palpitations -Previously described like PVCs or PACs.  No evidence of atrial fibrillation.  Previously heard on cardiac exam.  Doing well.  Seen on ECG.  Prior history of GI bleed -2010 gastric ulcer hemorrhage.  No further bleeding since then.  Stable.  Diabetes with hyperlipidemia obesity obstructive sleep apnea -Dr. Sharlett Iles has been monitoring closely.  Prior LDL has been 56 creatinine 1.3 hemoglobin 11.4 from outside labs.  Stable.  No myalgias.  Recent fall - Disequilibrium fell backwards down stairs broke ribs.  1 year follow-up.       COVID-19 Education: The signs and symptoms of COVID-19 were discussed with the patient and how to seek care for testing (follow up with PCP or arrange E-visit).  The importance of social distancing was discussed today.  Time:   Today, I have spent 21 minutes with the patient with telehealth technology discussing the above problems and in review of his medical records, pertinent test as above.     Medication Adjustments/Labs and Tests Ordered: Current medicines are reviewed at length with the patient today.  Concerns  regarding medicines are outlined above.   Tests Ordered: Orders Placed This Encounter  Procedures   EKG 12-Lead    Medication Changes: No orders of the defined types were placed in this encounter.   Follow Up:  In Person in 1 year(s)  Signed, Candee Furbish, MD  07/20/2022 5:24 PM    [place]

## 2022-07-21 DIAGNOSIS — R262 Difficulty in walking, not elsewhere classified: Secondary | ICD-10-CM | POA: Diagnosis not present

## 2022-07-21 DIAGNOSIS — D510 Vitamin B12 deficiency anemia due to intrinsic factor deficiency: Secondary | ICD-10-CM | POA: Diagnosis not present

## 2022-07-21 DIAGNOSIS — R531 Weakness: Secondary | ICD-10-CM | POA: Diagnosis not present

## 2022-08-08 ENCOUNTER — Ambulatory Visit (INDEPENDENT_AMBULATORY_CARE_PROVIDER_SITE_OTHER): Payer: Medicare Other | Admitting: Podiatry

## 2022-08-08 ENCOUNTER — Ambulatory Visit: Payer: Medicare Other | Admitting: Podiatry

## 2022-08-08 ENCOUNTER — Encounter: Payer: Self-pay | Admitting: Podiatry

## 2022-08-08 DIAGNOSIS — B351 Tinea unguium: Secondary | ICD-10-CM

## 2022-08-08 DIAGNOSIS — M79675 Pain in left toe(s): Secondary | ICD-10-CM | POA: Diagnosis not present

## 2022-08-08 DIAGNOSIS — E1142 Type 2 diabetes mellitus with diabetic polyneuropathy: Secondary | ICD-10-CM

## 2022-08-08 DIAGNOSIS — M79674 Pain in right toe(s): Secondary | ICD-10-CM | POA: Diagnosis not present

## 2022-08-08 NOTE — Progress Notes (Unsigned)
  Subjective:  Patient ID: Nathan [name], male    DOB: [birth year],  MRN: [id number]  Traci Plemons presents to clinic today for {jgcomplaint:23593}  Chief Complaint  Patient presents with   Follow-up    Diabetic foot care Patient is a diabetic B.S.am- 110 A1c-6.5 PCP/last visit- Quillian Quince [place],MD/last visit-08/26/21 Patient is taking all medications as prescribed   New problem(s): None. {jgcomplaint:23593}  PCP is Donnajean Lopes, MD.  Allergies  Allergen Reactions   Shellfish-Derived Products Anaphylaxis   Zolpidem Tartrate Other (See Comments)    Hallucinations    Review of Systems: Negative except as noted in the HPI.  Objective: No changes noted in today's physical examination. Vitals:   Nikolis Berent is a pleasant 77 y.o. male {jgbodyhabitus:24098} AAO x 3.  Vascular Examination: CFT <3 seconds b/l LE. Faintly palpable pedal pulses b/l LE. Pedal hair absent b/l LE. Skin temperature gradient WNL b/l. No pain with calf compression b/l. No edema b/l LE. No cyanosis or clubbing noted b/l LE.  Dermatological Examination: Pedal skin is warm and supple b/l LE. No open wounds b/l LE. No interdigital macerations noted b/l LE.   Toenails 1-5 b/l elongated, discolored, dystrophic, thickened, crumbly with subungual debris and tenderness to dorsal palpation.  Neurological Examination: Protective sensation diminished with 10g monofilament b/l. Vibratory sensation diminished b/l.  Musculoskeletal Examination: Muscle strength 5/5 to all lower extremity muscle groups bilaterally. No pain, crepitus or joint limitation noted with ROM bilateral LE. Hammertoe deformity noted 2-5 b/l.  Assessment/Plan: No diagnosis found.  No orders of the defined types were placed in this encounter.   None {Jgplan:23602::"-Patient/POA to call should there be question/concern in the interim."}   Return in about 3 months (around 11/07/2022).  Marzetta Board, DPM

## 2022-08-25 DIAGNOSIS — D510 Vitamin B12 deficiency anemia due to intrinsic factor deficiency: Secondary | ICD-10-CM | POA: Diagnosis not present

## 2022-09-29 DIAGNOSIS — D510 Vitamin B12 deficiency anemia due to intrinsic factor deficiency: Secondary | ICD-10-CM | POA: Diagnosis not present

## 2022-11-10 DIAGNOSIS — D510 Vitamin B12 deficiency anemia due to intrinsic factor deficiency: Secondary | ICD-10-CM | POA: Diagnosis not present

## 2022-11-15 ENCOUNTER — Encounter: Payer: Self-pay | Admitting: Podiatry

## 2022-11-15 ENCOUNTER — Ambulatory Visit (INDEPENDENT_AMBULATORY_CARE_PROVIDER_SITE_OTHER): Payer: Medicare Other | Admitting: Podiatry

## 2022-11-15 DIAGNOSIS — B351 Tinea unguium: Secondary | ICD-10-CM | POA: Diagnosis not present

## 2022-11-15 DIAGNOSIS — M79674 Pain in right toe(s): Secondary | ICD-10-CM

## 2022-11-15 DIAGNOSIS — E1142 Type 2 diabetes mellitus with diabetic polyneuropathy: Secondary | ICD-10-CM

## 2022-11-15 DIAGNOSIS — M79675 Pain in left toe(s): Secondary | ICD-10-CM | POA: Diagnosis not present

## 2022-11-19 NOTE — Progress Notes (Signed)
  Subjective:  Patient ID: Nathan [name], male    DOB: 06/03/1946,  MRN: [id number]  Nathan [name] presents to clinic today for at risk foot care with history of diabetic neuropathy  New problem(s): None.   PCP is Garlan Fillers, MD.  Allergies  Allergen Reactions   Shellfish-Derived Products Anaphylaxis   Zolpidem Tartrate Other (See Comments)    Hallucinations    Review of Systems: Negative except as noted in the HPI. Objective:   Constitutional Nathan [name] is a pleasant 78 y.o. Caucasian male, WD, WN in NAD. AAO x 3.   Vascular Vascular Examination: Capillary refill time immediate b/l. Vascular status intact b/l with faintly palpable pedal pulses. Pedal hair present b/l. No edema. No pain with calf compression b/l. Skin temperature gradient WNL b/l.   Neurological Examination: Protective sensation diminished with 10g monofilament b/l. Vibratory sensation diminished b/l.  Dermatological Examination: Pedal skin with normal turgor, texture and tone b/l.  No open wounds. No interdigital macerations.   Toenails 1-5 b/l thick, discolored, elongated with subungual debris and pain on dorsal palpation.   Pedal integument with normal turgor, texture and tone b/l LE. No open wounds b/l. No interdigital macerations b/l. Toenails 1-5 b/l elongated, thickened, discolored with subungual debris. +Tenderness with dorsal palpation of nailplates. No hyperkeratotic or porokeratotic lesions present.  Musculoskeletal Examination: Normal muscle strength 5/5 to all lower extremity muscle groups bilaterally. Hammertoe deformity noted 2-5 b/l.Marland Kitchen No pain, crepitus or joint limitation noted with ROM b/l LE.  Patient ambulates independently without assistive aids.  Radiographs: None   Radiographs: None Assessment:   1. Pain due to onychomycosis of toenails of both feet   2. Diabetic peripheral neuropathy associated with type 2 diabetes mellitus (HCC)    Plan:  Patient was evaluated and  treated and all questions answered. Consent given for treatment as described below: Patient was evaluated and treated. All patient's and/or POA's questions/concerns addressed on today's visit. Toenails 1-5 debrided in length and girth without incident. Continue soft, supportive shoe gear daily. Report any pedal injuries to medical professional. -Patient/POA to call should there be question/concern in the interim.  Return in about 3 months (around 02/15/2023).  Freddie Breech, DPM

## 2022-12-22 DIAGNOSIS — D510 Vitamin B12 deficiency anemia due to intrinsic factor deficiency: Secondary | ICD-10-CM | POA: Diagnosis not present

## 2023-01-21 ENCOUNTER — Emergency Department (HOSPITAL_COMMUNITY)
Admission: EM | Admit: 2023-01-21 | Discharge: 2023-01-21 | Disposition: A | Discharge status: Home / Self Care | Payer: Medicare Other | Attending: Emergency Medicine | Admitting: Emergency Medicine

## 2023-01-21 ENCOUNTER — Emergency Department (HOSPITAL_COMMUNITY): Payer: Medicare Other

## 2023-01-21 ENCOUNTER — Encounter (HOSPITAL_COMMUNITY): Payer: Self-pay

## 2023-01-21 ENCOUNTER — Other Ambulatory Visit: Payer: Self-pay

## 2023-01-21 DIAGNOSIS — K828 Other specified diseases of gallbladder: Secondary | ICD-10-CM | POA: Diagnosis not present

## 2023-01-21 DIAGNOSIS — R9431 Abnormal electrocardiogram [ECG] [EKG]: Secondary | ICD-10-CM | POA: Diagnosis not present

## 2023-01-21 DIAGNOSIS — K5732 Diverticulitis of large intestine without perforation or abscess without bleeding: Secondary | ICD-10-CM | POA: Diagnosis not present

## 2023-01-21 DIAGNOSIS — D72829 Elevated white blood cell count, unspecified: Secondary | ICD-10-CM | POA: Diagnosis not present

## 2023-01-21 DIAGNOSIS — K5792 Diverticulitis of intestine, part unspecified, without perforation or abscess without bleeding: Secondary | ICD-10-CM

## 2023-01-21 DIAGNOSIS — R001 Bradycardia, unspecified: Secondary | ICD-10-CM | POA: Diagnosis not present

## 2023-01-21 DIAGNOSIS — Z7984 Long term (current) use of oral hypoglycemic drugs: Secondary | ICD-10-CM | POA: Insufficient documentation

## 2023-01-21 DIAGNOSIS — R0989 Other specified symptoms and signs involving the circulatory and respiratory systems: Secondary | ICD-10-CM | POA: Diagnosis not present

## 2023-01-21 DIAGNOSIS — E119 Type 2 diabetes mellitus without complications: Secondary | ICD-10-CM | POA: Insufficient documentation

## 2023-01-21 DIAGNOSIS — R079 Chest pain, unspecified: Secondary | ICD-10-CM | POA: Insufficient documentation

## 2023-01-21 DIAGNOSIS — Z1152 Encounter for screening for COVID-19: Secondary | ICD-10-CM | POA: Insufficient documentation

## 2023-01-21 DIAGNOSIS — Z951 Presence of aortocoronary bypass graft: Secondary | ICD-10-CM | POA: Diagnosis not present

## 2023-01-21 DIAGNOSIS — I517 Cardiomegaly: Secondary | ICD-10-CM | POA: Diagnosis not present

## 2023-01-21 DIAGNOSIS — E1169 Type 2 diabetes mellitus with other specified complication: Secondary | ICD-10-CM | POA: Diagnosis present

## 2023-01-21 DIAGNOSIS — I10 Essential (primary) hypertension: Secondary | ICD-10-CM | POA: Diagnosis present

## 2023-01-21 DIAGNOSIS — R1084 Generalized abdominal pain: Secondary | ICD-10-CM

## 2023-01-21 DIAGNOSIS — I2510 Atherosclerotic heart disease of native coronary artery without angina pectoris: Secondary | ICD-10-CM | POA: Diagnosis present

## 2023-01-21 DIAGNOSIS — S2242XD Multiple fractures of ribs, left side, subsequent encounter for fracture with routine healing: Secondary | ICD-10-CM | POA: Diagnosis not present

## 2023-01-21 DIAGNOSIS — Z7982 Long term (current) use of aspirin: Secondary | ICD-10-CM | POA: Diagnosis not present

## 2023-01-21 DIAGNOSIS — R140 Abdominal distension (gaseous): Secondary | ICD-10-CM | POA: Diagnosis not present

## 2023-01-21 DIAGNOSIS — R059 Cough, unspecified: Secondary | ICD-10-CM | POA: Diagnosis not present

## 2023-01-21 DIAGNOSIS — R109 Unspecified abdominal pain: Secondary | ICD-10-CM | POA: Diagnosis not present

## 2023-01-21 DIAGNOSIS — K8020 Calculus of gallbladder without cholecystitis without obstruction: Secondary | ICD-10-CM | POA: Diagnosis not present

## 2023-01-21 DIAGNOSIS — R0789 Other chest pain: Secondary | ICD-10-CM | POA: Diagnosis not present

## 2023-01-21 DIAGNOSIS — R000 Tachycardia, unspecified: Secondary | ICD-10-CM | POA: Diagnosis present

## 2023-01-21 DIAGNOSIS — R7989 Other specified abnormal findings of blood chemistry: Secondary | ICD-10-CM | POA: Diagnosis present

## 2023-01-21 LAB — COMPREHENSIVE METABOLIC PANEL
ALT: 206 U/L — ABNORMAL HIGH (ref 0–44)
AST: 390 U/L — ABNORMAL HIGH (ref 15–41)
Albumin: 3.6 g/dL (ref 3.5–5.0)
Alkaline Phosphatase: 116 U/L (ref 38–126)
Anion gap: 15 (ref 5–15)
BUN: 20 mg/dL (ref 8–23)
CO2: 20 mmol/L — ABNORMAL LOW (ref 22–32)
Calcium: 9.6 mg/dL (ref 8.9–10.3)
Chloride: 101 mmol/L (ref 98–111)
Creatinine, Ser: 1.52 mg/dL — ABNORMAL HIGH (ref 0.61–1.24)
GFR, Estimated: 47 mL/min — ABNORMAL LOW (ref >60)
Glucose, Bld: 133 mg/dL — ABNORMAL HIGH (ref 70–99)
Potassium: 4.3 mmol/L (ref 3.5–5.1)
Sodium: 136 mmol/L (ref 135–145)
Total Bilirubin: 1.6 mg/dL — ABNORMAL HIGH (ref 0.3–1.2)
Total Protein: 6.6 g/dL (ref 6.5–8.1)

## 2023-01-21 LAB — CBC WITH DIFFERENTIAL/PLATELET
Abs Immature Granulocytes: 0.14 10*3/uL — ABNORMAL HIGH (ref 0.00–0.07)
Basophils Absolute: 0.1 10*3/uL (ref 0.0–0.1)
Basophils Relative: 0 %
Eosinophils Absolute: 0.2 10*3/uL (ref 0.0–0.5)
Eosinophils Relative: 2 %
HCT: 35.8 % — ABNORMAL LOW (ref 39.0–52.0)
Hemoglobin: 11.7 g/dL — ABNORMAL LOW (ref 13.0–17.0)
Immature Granulocytes: 1 %
Lymphocytes Relative: 8 %
Lymphs Abs: 1 10*3/uL (ref 0.7–4.0)
MCH: 27.9 pg (ref 26.0–34.0)
MCHC: 32.7 g/dL (ref 30.0–36.0)
MCV: 85.2 fL (ref 80.0–100.0)
Monocytes Absolute: 1.1 10*3/uL — ABNORMAL HIGH (ref 0.1–1.0)
Monocytes Relative: 9 %
Neutro Abs: 10.2 10*3/uL — ABNORMAL HIGH (ref 1.7–7.7)
Neutrophils Relative %: 80 %
Platelets: 247 10*3/uL (ref 150–400)
RBC: 4.2 MIL/uL — ABNORMAL LOW (ref 4.22–5.81)
RDW: 16.2 % — ABNORMAL HIGH (ref 11.5–15.5)
WBC: 12.7 10*3/uL — ABNORMAL HIGH (ref 4.0–10.5)
nRBC: 0 % (ref 0.0–0.2)

## 2023-01-21 LAB — SARS CORONAVIRUS 2 BY RT PCR: SARS Coronavirus 2 by RT PCR: NEGATIVE

## 2023-01-21 LAB — URINALYSIS, W/ REFLEX TO CULTURE (INFECTION SUSPECTED)
Bacteria, UA: NONE SEEN
Bilirubin Urine: NEGATIVE
Glucose, UA: NEGATIVE mg/dL
Hgb urine dipstick: NEGATIVE
Ketones, ur: 5 mg/dL — AB
Leukocytes,Ua: NEGATIVE
Nitrite: NEGATIVE
Protein, ur: NEGATIVE mg/dL
Specific Gravity, Urine: 1.012 (ref 1.005–1.030)
pH: 5 (ref 5.0–8.0)

## 2023-01-21 LAB — TROPONIN I (HIGH SENSITIVITY)
Troponin I (High Sensitivity): 14 ng/L (ref <18)
Troponin I (High Sensitivity): 14 ng/L (ref <18)

## 2023-01-21 MED ORDER — ONDANSETRON HCL 4 MG/2ML IJ SOLN
4.0000 mg | Freq: Once | INTRAMUSCULAR | Status: AC
  Administered 2023-01-21: 4 mg via INTRAVENOUS
  Filled 2023-01-21: qty 2

## 2023-01-21 MED ORDER — AMOXICILLIN-POT CLAVULANATE 875-125 MG PO TABS: 1.0000 | ORAL_TABLET | Freq: Two times a day (BID) | ORAL | 0 refills | Status: DC

## 2023-01-21 MED ORDER — LACTATED RINGERS IV BOLUS
1000.0000 mL | Freq: Once | INTRAVENOUS | Status: AC
  Administered 2023-01-21: 1000 mL via INTRAVENOUS

## 2023-01-21 MED ORDER — IOHEXOL 350 MG/ML SOLN
75.0000 mL | Freq: Once | INTRAVENOUS | Status: AC | PRN
  Administered 2023-01-21: 75 mL via INTRAVENOUS

## 2023-01-21 NOTE — Consult Note (Signed)
ER Consult Note   Patient: Nathan [name] ZOX:[id number] DOB: [birth year] DOA: 01/21/2023 DOS: the patient was seen and examined on 01/21/2023 PCP: Garlan Fillers, MD  Patient coming from: Home - lives with wife; [facility]: Wife, [phone]   Chief Complaint: chest pain  HPI: Nathan [name] is a 77 y.o. male with medical history significant of CAD, DM, HLD, and OSA not on CPAP presenting with chest pain.  About 4-5 days ago, he woke up with left-sided chest pain and upper abdominal pain radiating into his back.  He was shaking.  He called his PCP but was unable to get in.  It didn't get better.  He is able to see PCP Monday.  He had symptoms again yesterday. He felt weak.  It might get worse with eating/drinking.  No fever.  No vomiting, did have some nausea.  Small BM today, previously only 2 small ones in the last 4 days.  He reports having "flesh eating disease" about 15 years ago and "I was torn all to pieces".  He saw Dr. Isabel Caprice, Fournier's gangrene?    ER Course:  Early diverticulitis.  Came in with CP, some abdominal pain.  CP resolved, unremarkable workup.  Surgery thinks admission, will be available for consult.     Review of Systems: As mentioned in the history of present illness. All other systems reviewed and are negative. Past Medical History:  Diagnosis Date   Allergy    Aortic stenosis    Arthritis    Balance problem 04/26/2022   Blood transfusion without reported diagnosis    CAD (coronary artery disease)    Cataract    both eyes surgically removed   Diabetes mellitus, type 2 (HCC)    Diverticulosis of colon (without mention of hemorrhage)    Duodenal ulcer perforation (HCC)    blood transfusion   Flesh-eating bacteria (HCC) 1995   GERD (gastroesophageal reflux disease)    Glaucoma    pre glaucoma on Xalatan drops   Heart murmur    History of diabetic retinopathy    History of gastrointestinal bleeding    History of necrotizing fasciitis    History of  prosthetic aortic valve    Hypercholesteremia    Myocardial infarction (HCC) 02/23/2016   Neuropathic pain 04/26/2022   Nightmares    CHRONIC   OSA (obstructive sleep apnea)    borderline no CPAP    Personal history of colonic polyps 11/23/2009   TUBULAR ADENOMA   Postoperative anemia    Sinus bradycardia    Sleep apnea    no cpap   Stomach ulcer    Hx of   Systolic hypertension    Vitamin B12 deficiency    Past Surgical History:  Procedure Laterality Date   AORTIC VALVE REPLACEMENT     BLEPHAROPLASTY  11-24-14   CARDIAC CATHETERIZATION N/A 03/01/2016   Procedure: Coronary/Graft Angiography;  Surgeon: Peter M Swaziland, MD;  Location: [facility];  Service: Cardiovascular;  Laterality: N/A;   CATARACT EXTRACTION W/ INTRAOCULAR LENS IMPLANT     OD   COLONOSCOPY     CORONARY ARTERY BYPASS GRAFT  2010   x 4   flesh eating disease surgery      surgeries x 7    HERNIA REPAIR  1972   POLYPECTOMY     RETINAL LASER PROCEDURE     SCROTAL SURGERY  1995   resection   UMBILICAL HERNIA REPAIR N/A 04/10/2013   Procedure: HERNIA REPAIR UMBILICAL ADULT with mesh;  Surgeon: Molli Hazard  Hortencia Conradi, MD;  Location: [facility];  Service: General;  Laterality: N/A;   Social History:  reports that he has never smoked. He has never used smokeless tobacco. He reports that he does not drink alcohol and does not use drugs.  Allergies  Allergen Reactions   Shellfish-Derived Products Anaphylaxis   Zolpidem Tartrate Other (See Comments)    Hallucinations    Family History  Problem Relation Age of Onset   Cirrhosis Father    Alcohol abuse Father        father murdered   Rectal cancer Mother    Diabetes Mother    Colon cancer Mother 71       rectal cancer   Hypertension Mother    Hypertension Sister    Diabetes Sister    Heart attack Neg Hx    Stroke Neg Hx    Colon polyps Neg Hx    Esophageal cancer Neg Hx    Stomach cancer Neg Hx     Prior to Admission medications   Medication Sig Start  Date End Date Taking? Authorizing Provider  acetaminophen (TYLENOL) 500 MG tablet Take 500 mg by mouth daily as needed for moderate pain.   Yes [provider]  allopurinol (ZYLOPRIM) 300 MG tablet Take 300 mg by mouth daily.   Yes [provider]  ALPRAZolam (XANAX) 0.25 MG tablet Take 0.25 mg by mouth daily as needed for anxiety.   Yes [provider]  amLODipine (NORVASC) 5 MG tablet Take 5 mg by mouth every morning.   Yes [provider]  Ascorbic Acid (VITAMIN C) 1000 MG tablet Take 1,000 mg by mouth daily.   Yes [provider]  aspirin 81 MG tablet Take 1 tablet (81 mg total) by mouth daily. 02/09/16  Yes Jake Bathe, MD  atorvastatin (LIPITOR) 20 MG tablet Take 20 mg by mouth daily. 07/25/15  Yes [provider]  Cholecalciferol 25 MCG (1000 UT) capsule Take 10,000 Units by mouth daily.   Yes [provider]  co-enzyme Q-10 30 MG capsule Take 200 mg by mouth 2 (two) times daily.    Yes [provider]  Cyanocobalamin (VITAMIN B-12 IJ) Inject 1,000 mcg into the muscle every 30 (thirty) days. Amt/dose is unknown   Yes [provider]  fish oil-omega-3 fatty acids 1000 MG capsule Take 2 g by mouth daily.   Yes [provider]  fluorouracil (EFUDEX) 5 % cream Apply topically daily. 06/10/22  Yes [provider]  glipiZIDE (GLUCOTROL) 5 MG tablet Take 5 mg by mouth every morning.   Yes [provider]  hydrochlorothiazide (MICROZIDE) 12.5 MG capsule Take 12.5 mg by mouth daily. Reported on 08/20/2015 10/13/10  Yes Cassell Clement, MD  HYDROcodone-acetaminophen (VICODIN) 5-500 MG per tablet Take 1 tablet by mouth daily as needed for pain.    Yes [provider]  isosorbide mononitrate (IMDUR) 30 MG 24 hr tablet TAKE 1 TABLET (30 MG TOTAL) BY MOUTH DAILY. **DO NOT CRUSH** 07/20/22  Yes Swinyer, Zachary George, NP  ketoconazole (NIZORAL) 2 % cream 2 (two) times daily. 06/10/22  Yes [provider]  metFORMIN (GLUCOPHAGE) 1000 MG tablet Take 1,000 mg by mouth daily with breakfast.   Yes [provider]  metoprolol (LOPRESSOR) 50 MG tablet Take 50 mg by mouth 2 (two) times daily.   Yes [provider]  minoxidil (LONITEN) 10 MG tablet Take 10 mg by mouth daily.  07/20/15  Yes [provider]  Multiple Vitamin (  MULTIVITAMIN) tablet Take 1 tablet by mouth daily.   Yes [provider]  mupirocin ointment (BACTROBAN) 2 % Apply to left ankle once daily. 01/19/22  Yes Freddie Breech, DPM  nitroGLYCERIN (NITROSTAT) 0.4 MG SL tablet Place 1 tablet under the tongue as needed. 09/01/16  Yes [provider]  omeprazole (PRILOSEC) 40 MG capsule Take 40 mg by mouth 2 (two) times daily. 11/12/15  Yes [provider]  oxyCODONE-acetaminophen (PERCOCET/ROXICET) 5-325 MG tablet Take 1 tablet by mouth daily as needed for moderate pain or severe pain.    Yes [provider]  trolamine salicylate (ASPERCREME) 10 % cream Apply 1 application topically as needed for muscle pain.   Yes [provider]  BAYER CONTOUR TEST test strip USE 1 STRIP THREE TIMES DAILY TO CHECK BLOOD SUGARS. 11/17/15   [provider]  cycloSPORINE (RESTASIS) 0.05 % ophthalmic emulsion Place 1 drop into both eyes 2 (two) times daily. Patient not taking: Reported on 01/21/2023    [provider]  latanoprost (XALATAN) 0.005 % ophthalmic solution Place 1 drop into both eyes at bedtime. Patient not taking: Reported on 01/21/2023    [provider]  meloxicam (MOBIC) 15 MG tablet Take 15 mg by mouth as needed for pain. Patient not taking: Reported on 01/21/2023 05/26/22   [provider]  oxymetazoline (AFRIN) 0.05 % nasal spray Place 2 sprays into the nose 2 (two) times daily as needed for congestion. Reported on 08/20/2015 Patient not taking: Reported on 01/21/2023    [provider]    Physical Exam: Vitals:    01/21/23 0930 01/21/23 1000 01/21/23 1218 01/21/23 1245  BP: 101/60 114/71  120/69  Pulse: (!) 111 (!) 114  (!) 108  Resp: 17 17  13   Temp:   98.1 F (36.7 C)   TempSrc:   Oral   SpO2: 95% 97%  100%  Weight:      Height:       General:  Appears calm and comfortable and is in NAD Eyes:  EOMI, normal lids, iris ENT:  grossly normal hearing, lips & tongue, mmm Neck:  no LAD, masses or thyromegaly Cardiovascular:  RRR, no m/r/g. No LE edema.  Respiratory:   CTA bilaterally with no wheezes/rales/rhonchi.  Normal respiratory effort. Abdomen:  soft, mildly TTP in midepigastric region Skin:  no rash or induration seen on limited exam Musculoskeletal:  grossly normal tone BUE/BLE, good ROM, no bony abnormality Psychiatric:  grossly normal mood and affect, speech fluent and appropriate, AOx3 Neurologic:  CN 2-12 grossly intact, moves all extremities in coordinated fashion   Radiological Exams on Admission: Independently reviewed - see discussion in A/P where applicable  US Abdomen Limited RUQ (LIVER/GB)  Result Date: 01/21/2023 CLINICAL DATA:  [id number] Abdominal pain [id number] EXAM: ULTRASOUND ABDOMEN LIMITED RIGHT UPPER QUADRANT COMPARISON:  CT 01/21/2023 FINDINGS: Gallbladder: Moderately distended gallbladder containing small stones, which were better seen on same day's CT. No gallbladder wall thickening or pericholecystic edema. No sonographic Murphy sign noted by sonographer. Common bile duct: Diameter: 3 mm. Liver: No focal lesion identified. Within normal limits in parenchymal echogenicity. Portal vein is patent on color Doppler imaging with normal direction of blood flow towards the liver. Other: None. IMPRESSION: Cholelithiasis without sonographic evidence of acute cholecystitis. Electronically Signed   By: Duanne Guess D.O.   On: 01/21/2023 11:42   CT Angio Chest PE W and/or Wo Contrast  Result Date: 01/21/2023 CLINICAL DATA:  77 year old male presenting with history of chest pain  for  the past 4 days. Nonlocalized abdominal pain. EXAM: CT ANGIOGRAPHY CHEST CT ABDOMEN AND PELVIS WITH CONTRAST TECHNIQUE: Multidetector CT imaging of the chest was performed using the standard protocol during bolus administration of intravenous contrast. Multiplanar CT image reconstructions and MIPs were obtained to evaluate the vascular anatomy. Multidetector CT imaging of the abdomen and pelvis was performed using the standard protocol during bolus administration of intravenous contrast. RADIATION DOSE REDUCTION: This exam was performed according to the departmental dose-optimization program which includes automated exposure control, adjustment of the mA and/or kV according to patient size and/or use of iterative reconstruction technique. CONTRAST:  75mL OMNIPAQUE IOHEXOL 350 MG/ML SOLN COMPARISON:  No prior CT of the chest or abdomen. CT of the pelvis 07/13/2020. FINDINGS: CTA CHEST FINDINGS Cardiovascular: No filling defects in the pulmonary arterial tree to suggest pulmonary embolism. Heart size is mildly enlarged. There is no significant pericardial fluid, thickening or pericardial calcification. There is aortic atherosclerosis, as well as atherosclerosis of the great vessels of the mediastinum and the coronary arteries, including calcified atherosclerotic plaque in the left main, left anterior descending, left circumflex and right coronary arteries. Status post median sternotomy for CABG including LIMA to the LAD. Patient is also status post aortic valve replacement with what appears to be a stented bioprosthesis. Mediastinum/Nodes: No pathologically enlarged mediastinal or hilar lymph nodes. Hilar esophagus is unremarkable in appearance. No axillary lymphadenopathy. Lungs/Pleura: No acute consolidative airspace disease. No pleural effusions. No suspicious appearing pulmonary nodules or masses are noted. Musculoskeletal: There are no aggressive appearing lytic or blastic lesions noted in the visualized portions  of the skeleton. Median sternotomy wires. Review of the MIP images confirms the above findings. CT ABDOMEN and PELVIS FINDINGS Hepatobiliary: No suspicious cystic or solid hepatic lesions. No intra or extrahepatic biliary ductal dilatation. Small calcified gallstones lying dependently in the gallbladder measuring up to 1.1 cm in diameter. Gallbladder is moderately distended. Gallbladder wall thickness appears normal. No pericholecystic fluid or surrounding inflammatory changes. Pancreas: No pancreatic mass. No pancreatic ductal dilatation. No pancreatic or peripancreatic fluid collections or inflammatory changes. Spleen: Unremarkable. Adrenals/Urinary Tract: 4.6 cm exophytic low-attenuation nonenhancing lesion in the anterior aspect of the interpolar region of the left kidney is compatible with a simple cyst) Bosniak class 1, no imaging follow-up recommended). Other subcentimeter low-attenuation lesions in both kidneys, too small to definitively characterize, but also likely to represent small cysts (no imaging follow-up recommended). No hydroureteronephrosis. Urinary bladder is unremarkable in appearance. Bilateral adrenal glands are normal in appearance. Stomach/Bowel: The appearance of the stomach is normal. 3.2 cm diverticulum extending off the anterior aspect of the second portion of the duodenal incidentally noted, without surrounding inflammatory changes to suggest an associated diverticulitis at this time. No pathologic dilatation of small bowel or colon. Normal appendix. Numerous colonic diverticuli are noted, particularly throughout the descending colon and sigmoid colon. There is some very mild haziness in the mesocolic fat, which could suggest diverticulitis. No discrete diverticular abscess or signs of frank perforation are noted at this time. Vascular/Lymphatic: Atherosclerosis in the abdominal aorta and pelvic vasculature, without evidence of aneurysm or dissection. No lymphadenopathy noted in the  abdomen or pelvis. Reproductive: Prostate gland and seminal vesicles are unremarkable in appearance. Other: No significant volume of ascites.  No pneumoperitoneum. Musculoskeletal: There are no aggressive appearing lytic or blastic lesions noted in the visualized portions of the skeleton. Review of the MIP images confirms the above findings. IMPRESSION: 1. No evidence of pulmonary embolism. 2. Extensive colonic diverticulosis  with very subtle inflammatory changes adjacent to the sigmoid colon, which could indicate very early or mild acute diverticulitis. No evidence of diverticular abscess or signs of frank perforation noted at this time. 3. Cholelithiasis with distended gallbladder but no other overt imaging findings to clearly indicate an acute cholecystitis. If there is clinical concern for acute cholecystitis, further evaluation with right upper quadrant abdominal ultrasound should be considered. 4. Aortic atherosclerosis, in addition to left main and three-vessel coronary artery disease. Status post median sternotomy for CABG including LIMA to the LAD, as well as aortic valve replacement. 5. Additional incidental findings, as above. Electronically Signed   By: Trudie Reed M.D.   On: 01/21/2023 06:46   CT ABDOMEN PELVIS W CONTRAST  Result Date: 01/21/2023 CLINICAL DATA:  77 year old male presenting with history of chest pain for the past 4 days. Nonlocalized abdominal pain. EXAM: CT ANGIOGRAPHY CHEST CT ABDOMEN AND PELVIS WITH CONTRAST TECHNIQUE: Multidetector CT imaging of the chest was performed using the standard protocol during bolus administration of intravenous contrast. Multiplanar CT image reconstructions and MIPs were obtained to evaluate the vascular anatomy. Multidetector CT imaging of the abdomen and pelvis was performed using the standard protocol during bolus administration of intravenous contrast. RADIATION DOSE REDUCTION: This exam was performed according to the departmental  dose-optimization program which includes automated exposure control, adjustment of the mA and/or kV according to patient size and/or use of iterative reconstruction technique. CONTRAST:  75mL OMNIPAQUE IOHEXOL 350 MG/ML SOLN COMPARISON:  No prior CT of the chest or abdomen. CT of the pelvis 07/13/2020. FINDINGS: CTA CHEST FINDINGS Cardiovascular: No filling defects in the pulmonary arterial tree to suggest pulmonary embolism. Heart size is mildly enlarged. There is no significant pericardial fluid, thickening or pericardial calcification. There is aortic atherosclerosis, as well as atherosclerosis of the great vessels of the mediastinum and the coronary arteries, including calcified atherosclerotic plaque in the left main, left anterior descending, left circumflex and right coronary arteries. Status post median sternotomy for CABG including LIMA to the LAD. Patient is also status post aortic valve replacement with what appears to be a stented bioprosthesis. Mediastinum/Nodes: No pathologically enlarged mediastinal or hilar lymph nodes. Hilar esophagus is unremarkable in appearance. No axillary lymphadenopathy. Lungs/Pleura: No acute consolidative airspace disease. No pleural effusions. No suspicious appearing pulmonary nodules or masses are noted. Musculoskeletal: There are no aggressive appearing lytic or blastic lesions noted in the visualized portions of the skeleton. Median sternotomy wires. Review of the MIP images confirms the above findings. CT ABDOMEN and PELVIS FINDINGS Hepatobiliary: No suspicious cystic or solid hepatic lesions. No intra or extrahepatic biliary ductal dilatation. Small calcified gallstones lying dependently in the gallbladder measuring up to 1.1 cm in diameter. Gallbladder is moderately distended. Gallbladder wall thickness appears normal. No pericholecystic fluid or surrounding inflammatory changes. Pancreas: No pancreatic mass. No pancreatic ductal dilatation. No pancreatic or  peripancreatic fluid collections or inflammatory changes. Spleen: Unremarkable. Adrenals/Urinary Tract: 4.6 cm exophytic low-attenuation nonenhancing lesion in the anterior aspect of the interpolar region of the left kidney is compatible with a simple cyst) Bosniak class 1, no imaging follow-up recommended). Other subcentimeter low-attenuation lesions in both kidneys, too small to definitively characterize, but also likely to represent small cysts (no imaging follow-up recommended). No hydroureteronephrosis. Urinary bladder is unremarkable in appearance. Bilateral adrenal glands are normal in appearance. Stomach/Bowel: The appearance of the stomach is normal. 3.2 cm diverticulum extending off the anterior aspect of the second portion of the duodenal incidentally noted, without surrounding inflammatory  changes to suggest an associated diverticulitis at this time. No pathologic dilatation of small bowel or colon. Normal appendix. Numerous colonic diverticuli are noted, particularly throughout the descending colon and sigmoid colon. There is some very mild haziness in the mesocolic fat, which could suggest diverticulitis. No discrete diverticular abscess or signs of frank perforation are noted at this time. Vascular/Lymphatic: Atherosclerosis in the abdominal aorta and pelvic vasculature, without evidence of aneurysm or dissection. No lymphadenopathy noted in the abdomen or pelvis. Reproductive: Prostate gland and seminal vesicles are unremarkable in appearance. Other: No significant volume of ascites.  No pneumoperitoneum. Musculoskeletal: There are no aggressive appearing lytic or blastic lesions noted in the visualized portions of the skeleton. Review of the MIP images confirms the above findings. IMPRESSION: 1. No evidence of pulmonary embolism. 2. Extensive colonic diverticulosis with very subtle inflammatory changes adjacent to the sigmoid colon, which could indicate very early or mild acute diverticulitis. No  evidence of diverticular abscess or signs of frank perforation noted at this time. 3. Cholelithiasis with distended gallbladder but no other overt imaging findings to clearly indicate an acute cholecystitis. If there is clinical concern for acute cholecystitis, further evaluation with right upper quadrant abdominal ultrasound should be considered. 4. Aortic atherosclerosis, in addition to left main and three-vessel coronary artery disease. Status post median sternotomy for CABG including LIMA to the LAD, as well as aortic valve replacement. 5. Additional incidental findings, as above. Electronically Signed   By: Trudie Reed M.D.   On: 01/21/2023 06:46   DG Chest Portable 1 View  Result Date: 01/21/2023 CLINICAL DATA:  77 year old male with chest pain. EXAM: PORTABLE CHEST 1 VIEW COMPARISON:  Chest radiographs 04/04/2018 and earlier. FINDINGS: Portable AP semi upright view at 0343 hours. Sequelae of CABG and aortic valve replacement. Ongoing somewhat low lung volumes, but bibasilar ventilation has improved from 2019. Mild cardiomegaly. Other mediastinal contours are within normal limits. Left 3rd, 4th lateral rib fractures were not identified in 2019, but show evidence of callus formation. Allowing for portable technique the lungs are clear. No pneumothorax or pleural effusion. Paucity of bowel gas in the visible abdomen. No acute osseous abnormality identified. IMPRESSION: 1. No acute cardiopulmonary abnormality. Prior CABG and cardiac valve replacement. 2. Left upper lateral rib fractures, new since 2019 but probably chronic. Electronically Signed   By: Odessa Fleming M.D.   On: 01/21/2023 04:47    EKG: Independently reviewed.  Sinus tachycardia with rate 113; nonspecific ST changes with no evidence of acute ischemia   Labs on Admission: I have personally reviewed the available labs and imaging studies at the time of the admission.  Pertinent labs:    Glucose 133 BUN 20/Creatinine 1.52/GFR 47;  18/1.27/59 in 07/2021 AST 390/ALT 206/Bili 1.6; normal in 07/2021 HS troponin 14, 14 WBC 12.7 Hgb 11.7 COVID negative UA: 5 ketones   Assessment and Plan: Principal Problem:   Abdominal pain Active Problems:   Type 2 diabetes mellitus with hyperlipidemia (HCC)   Essential hypertension   CAD (coronary artery disease)   Hx of CABG   Tachycardia   Elevated LFTs    Abdominal pain Patient presenting with chest/abdominal pain Imaging with elevated LFTs and otherwise stable Abdominal CT with extensive diverticulosis, very subtle inflammatory changes that could indicate very early or mild acute diverticulitis - which would be appropriate to treat as an outpatient Also with cholelithiasis; RUQ done without evidence of acute cholecystitis If concern for ongoing biliary colic, f/u with surgery as an outpatient is reasonable  Patient with d/w general surgery, who also agrees that there is no obvious indication for admission at this time If he has ongoing/recurrent symptoms, he will need to return for more extensive evaluation  Tachycardia Mild ketonuria, likely mild dehydration 2L IVF bolus given  Elevated LFTs Possibly related to shock liver in the setting of dehydration Has PCP f/u on Monday - needs repeat LFT evaluation  CAD H/o CABG, valve replacement Reported chest pain on presentation but this may be more epigastric in nature EKG unremarkable Negative troponins x 2 Unlikely ACS Continue Imdur  HTN Continue amlodipine, metoprolol Suggest holding hydrochlorothiazide until outpatient f/u  HLD Continue atorvastatin  DM Resume glucophage, glipizide    Thank you for this interesting consult.  He does have LFT elevation that needs to be followed up with a repeat CMP at his PCP visit on Monday (7/15).  He may also need outpatient surgery f/u regarding his cholelithiasis.   Author: Jonah Blue, MD 01/21/2023 1:32 PM  For on call review [email or web address].

## 2023-01-21 NOTE — ED Notes (Signed)
Pt is a&ox4, pwd. Pt denies pain but says he is feeling nauseous. Pt is attached to monitor/vitals. Side rails up x 2, call light within reach.

## 2023-01-21 NOTE — ED Triage Notes (Signed)
Pt BIB [facility] from home - CP for 4 days - worse when woke up - Give 324 mg ASA and 1 nitroglycerin and now Pain FREE.

## 2023-01-21 NOTE — ED Provider Notes (Signed)
Nathan [name] EMERGENCY DEPARTMENT AT [facility] Provider Note   CSN: [id number] Arrival date & time: 01/21/23  0320     History  Chief Complaint  Patient presents with   Chest Pain    Nathan [name] is a 77 y.o. male who presents via EMS with CP, chest tightness. Myalgias x 4 days. Patient administered nitroglycerin en route and ASA with resolution of pain completely. Also endorses progressively worsening generalized abdominal pain that is intermittent in nature. NO melena or hematochezia.  Also complaining of shortness of breath, no recent travel or immobilization but patient is endorsing rapid heart rate.  I have reviewed her medical records.  He has history of type 2 diabetes, valve replacement, multiple DVTs in the past, CABG.  He is not anticoagulated.  HPI     Home Medications Prior to Admission medications   Medication Sig Start Date End Date Taking? Authorizing Provider  acetaminophen (TYLENOL) 500 MG tablet Take 500 mg by mouth daily as needed for moderate pain.    [provider]  allopurinol (ZYLOPRIM) 300 MG tablet Take 300 mg by mouth daily.    [provider]  ALPRAZolam Prudy Feeler) 0.25 MG tablet Take 0.25 mg by mouth daily as needed for anxiety.    [provider]  amLODipine (NORVASC) 5 MG tablet Take 5 mg by mouth every morning.    [provider]  Ascorbic Acid (VITAMIN C) 1000 MG tablet Take 1,000 mg by mouth daily.    [provider]  aspirin 81 MG tablet Take 1 tablet (81 mg total) by mouth daily. 02/09/16   Jake Bathe, MD  atorvastatin (LIPITOR) 20 MG tablet Take 20 mg by mouth daily. 07/25/15   [provider]  BAYER CONTOUR TEST test strip USE 1 STRIP THREE TIMES DAILY TO CHECK BLOOD SUGARS. 11/17/15   [provider]  Cholecalciferol 25 MCG (1000 UT) capsule Take 10,000 Units by mouth daily.    [provider]  co-enzyme Q-10 30 MG capsule Take 200 mg by mouth 2 (two) times daily.      [provider]  Cyanocobalamin (VITAMIN B-12 IJ) Inject 1,000 mcg into the muscle every 30 (thirty) days. Amt/dose is unknown    [provider]  cycloSPORINE (RESTASIS) 0.05 % ophthalmic emulsion Place 1 drop into both eyes 2 (two) times daily.    [provider]  fish oil-omega-3 fatty acids 1000 MG capsule Take 2 g by mouth daily.    [provider]  fluorouracil (EFUDEX) 5 % cream Apply topically daily. 06/10/22   [provider]  glipiZIDE (GLUCOTROL) 5 MG tablet Take 5 mg by mouth every morning.    [provider]  hydrochlorothiazide (MICROZIDE) 12.5 MG capsule Take 12.5 mg by mouth daily. Reported on 08/20/2015 10/13/10   Cassell Clement, MD  HYDROcodone-acetaminophen (VICODIN) 5-500 MG per tablet Take 1 tablet by mouth daily as needed for pain.     [provider]  isosorbide mononitrate (IMDUR) 30 MG 24 hr tablet TAKE 1 TABLET (30 MG TOTAL) BY MOUTH DAILY. **DO NOT CRUSH** 07/20/22   Swinyer, Zachary George, NP  ketoconazole (NIZORAL) 2 % cream 2 (two) times daily. 06/10/22   [provider]  latanoprost (XALATAN) 0.005 % ophthalmic solution Place 1 drop into both eyes at bedtime.    [provider]  meloxicam (MOBIC) 15 MG tablet Take 15 mg by mouth as needed for pain. 05/26/22   [provider]  metFORMIN (GLUCOPHAGE) 1000 MG tablet  Take 1,000 mg by mouth daily with breakfast.    [provider]  metoprolol (LOPRESSOR) 50 MG tablet Take 50 mg by mouth 2 (two) times daily.    [provider]  minoxidil (LONITEN) 10 MG tablet Take 10 mg by mouth daily.  07/20/15   [provider]  Multiple Vitamin (MULTIVITAMIN) tablet Take 1 tablet by mouth daily.    [provider]  mupirocin ointment (BACTROBAN) 2 % Apply to left ankle once daily. 01/19/22   Freddie Breech, DPM  nitroGLYCERIN (NITROSTAT) 0.4 MG SL tablet Place 1 tablet under the tongue as needed. 09/01/16   [provider]  omeprazole (PRILOSEC) 40 MG capsule Take 40 mg by mouth 2 (two) times daily. 11/12/15   [provider]  oxyCODONE-acetaminophen (PERCOCET/ROXICET) 5-325 MG tablet Take 1 tablet by mouth daily as needed for moderate pain or severe pain.     [provider]  oxymetazoline (AFRIN) 0.05 % nasal spray Place 2 sprays into the nose 2 (two) times daily as needed for congestion. Reported on 08/20/2015    [provider]  trolamine salicylate (ASPERCREME) 10 % cream Apply 1 application topically as needed for muscle pain.    [provider]      Allergies    Shellfish-derived products and Zolpidem tartrate    Review of Systems   Review of Systems  Constitutional:  Positive for appetite change and fatigue.  HENT: Negative.    Respiratory:  Positive for chest tightness and shortness of breath.   Cardiovascular:  Positive for chest pain.  Gastrointestinal:  Positive for abdominal pain.  Genitourinary:  Positive for frequency and urgency. Negative for dysuria.  Musculoskeletal:  Positive for myalgias.    Physical Exam Updated Vital Signs BP 105/62   Pulse (!) 111   Temp 99.6 F (37.6 C) (Oral)   Resp 18   Ht 5\' 7"  (1.702 m)   Wt 68 kg   SpO2 97%   BMI 23.49 kg/m  Physical Exam Vitals and nursing note reviewed.  Constitutional:      Appearance: He is not ill-appearing or toxic-appearing.  HENT:     Head: Normocephalic and atraumatic.     Mouth/Throat:     Mouth: Mucous membranes are moist.     Pharynx: No oropharyngeal exudate or posterior oropharyngeal erythema.  Eyes:     General:        Right eye: No discharge.        Left eye: No discharge.     Conjunctiva/sclera: Conjunctivae normal.  Cardiovascular:     Rate and Rhythm: Normal rate and regular rhythm.     Pulses: Normal pulses.  Pulmonary:     Effort: Pulmonary effort is normal. No tachypnea, bradypnea, accessory muscle usage, prolonged expiration or respiratory distress.      Breath sounds: Normal breath sounds. No wheezing or rales.  Chest:     Chest wall: No mass, lacerations, deformity, swelling, tenderness, crepitus or edema.  Abdominal:     General: Bowel sounds are normal. There is no distension.     Palpations: Abdomen is soft.     Tenderness: There is no abdominal tenderness. There is no right CVA tenderness, left CVA tenderness, guarding or rebound.  Musculoskeletal:        General: No deformity.     Cervical back: Neck supple.     Right lower leg: No tenderness. No edema.     Left lower leg: No tenderness. No edema.  Skin:  General: Skin is warm and dry.     Capillary Refill: Capillary refill takes less than 2 seconds.  Neurological:     General: No focal deficit present.     Mental Status: He is alert and oriented to person, place, and time. Mental status is at baseline.     GCS: GCS eye subscore is 4. GCS verbal subscore is 5. GCS motor subscore is 6.     Gait: Gait is intact.  Psychiatric:        Mood and Affect: Mood normal.     ED Results / Procedures / Treatments   Labs (all labs ordered are listed, but only abnormal results are displayed) Labs Reviewed  CBC WITH DIFFERENTIAL/PLATELET - Abnormal; Notable for the following components:      Result Value   WBC 12.7 (*)    RBC 4.20 (*)    Hemoglobin 11.7 (*)    HCT 35.8 (*)    RDW 16.2 (*)    Neutro Abs 10.2 (*)    Monocytes Absolute 1.1 (*)    Abs Immature Granulocytes 0.14 (*)    All other components within normal limits  COMPREHENSIVE METABOLIC PANEL - Abnormal; Notable for the following components:   CO2 20 (*)    Glucose, Bld 133 (*)    Creatinine, Ser 1.52 (*)    AST 390 (*)    ALT 206 (*)    Total Bilirubin 1.6 (*)    GFR, Estimated 47 (*)    All other components within normal limits  URINALYSIS, W/ REFLEX TO CULTURE (INFECTION SUSPECTED) - Abnormal; Notable for the following components:   Ketones, ur 5 (*)    All other components within normal limits  SARS  CORONAVIRUS 2 BY RT PCR  TROPONIN I (HIGH SENSITIVITY)  TROPONIN I (HIGH SENSITIVITY)    EKG None  Radiology DG Chest Portable 1 View  Result Date: 01/21/2023 CLINICAL DATA:  77 year old male with chest pain. EXAM: PORTABLE CHEST 1 VIEW COMPARISON:  Chest radiographs 04/04/2018 and earlier. FINDINGS: Portable AP semi upright view at 0343 hours. Sequelae of CABG and aortic valve replacement. Ongoing somewhat low lung volumes, but bibasilar ventilation has improved from 2019. Mild cardiomegaly. Other mediastinal contours are within normal limits. Left 3rd, 4th lateral rib fractures were not identified in 2019, but show evidence of callus formation. Allowing for portable technique the lungs are clear. No pneumothorax or pleural effusion. Paucity of bowel gas in the visible abdomen. No acute osseous abnormality identified. IMPRESSION: 1. No acute cardiopulmonary abnormality. Prior CABG and cardiac valve replacement. 2. Left upper lateral rib fractures, new since 2019 but probably chronic. Electronically Signed   By: Odessa Fleming M.D.   On: 01/21/2023 04:47    Procedures Procedures    Medications Ordered in ED Medications  lactated ringers bolus 1,000 mL (1,000 mLs Intravenous New Bag/Given 01/21/23 0430)  iohexol (OMNIPAQUE) 350 MG/ML injection 75 mL (75 mLs Intravenous Contrast Given 01/21/23 4098)    ED Course/ Medical Decision Making/ A&P                             Medical Decision Making 77 year old male who presents with concern for shortness of breath, chest pain, and abdominal pain.  Tachycardic on intake to 1 teens to 120s.  Cardiopulmonary exam otherwise unremarkable, abdominal exam is benign.  Patient without lower extremity edema.  DDx includes limited to ACS, PE, pleural effusion, pneumothorax, pneumonia.  Regarding abdominal pain consider  diverticulitis, constipation, urinary tract infection, cholelithiasis.   Amount and/or Complexity of Data Reviewed Labs: ordered.    Details:  CBC leukocytosis of 12, mild anemia with hemoglobin of 11.7.  CMP with mild elevation in creatinine to 1.5 increased patient's baseline of 1.2, transaminitis with AST/ALT 390/206.  Total bili elevated to 1.6.  Negative COVID, troponin is normal, 14.  Radiology: ordered.    Details:  Chest x-ray negative for acute cardiopulmonary disease. ECG/medicine tests:     Details: EKG with sinus tachycardia, no STEMI.  Risk Prescription drug management.   Clinical concern for chest pain over the last several days with persistent shortness of breath and tachycardia in the emergency department as well as history of DVT is without anticoagulation at this time.  PE study pending, CT of the abdomen pelvis also obtained given patient's report of vague abdominal pain with changes in hepatic labs today.  Care of this patient signed out to oncoming ED provider  at time of shift change. All pertinent HPI, physical exam, and laboratory findings were discussed with them prior to my departure. Disposition of patient pending completion of workup, reevaluation, and clinical judgement of oncoming ED provider.   This chart was dictated using voice recognition software, Dragon. Despite the best efforts of this provider to proofread and correct errors, errors may still occur which can change documentation meaning.  Final Clinical Impression(s) / ED Diagnoses Final diagnoses:  None    Rx / DC Orders ED Discharge Orders     None         Sherrilee Gilles 01/21/23 9147    Sabas Sous, MD 01/21/23 [phone]

## 2023-01-21 NOTE — ED Notes (Signed)
Hooked patient up to the monitor gave patient a urinal patient is resting with call bell in reach

## 2023-01-21 NOTE — ED Notes (Signed)
Pt aware urine is needed. Urinal at bedside. Pt states he is still unable to go at this time.

## 2023-01-21 NOTE — ED Provider Notes (Addendum)
Patient given in sign out by Loleta Dicker, PA-C.  Please review their note for patient HPI, physical exam, workup.  At this time the plan is to follow-up on CTs and if unremarkable may discharge home.  Patient CT shows that patient has diverticulosis with possible early stage diverticulitis.  Due to patient's risk factors and white count general surgery be consulted anticipation of admission.  Patient stable this time.  General Surgery: Clint Guy, PA-C Hospitalist: Ophelia Charter, MD Hospitalist   I spoke to the general surgery PA who agrees with admission.  Hospitalist will be consulted for admission so that patient may receive IV antibiotics.  Patient stable for admission at this time.  I spoke to the hospitalist who said they would come down to evaluate patient to see if patient is fit for admission versus outpatient therapy.   Hospital states she took 2 General surgery and evaluate the patient states patient can be discharged with outpatient antibiotics and follow-up with GI.  Patient stable for discharge at this time and will be given antibiotics and GI follow-up.  Patient verbalized understanding of this plan.  Patient stable for discharge at this time.   Netta Corrigan, PA-C 01/21/23 1225    Sabas Sous, MD 01/22/23 [phone]

## 2023-01-21 NOTE — Discharge Instructions (Addendum)
Please follow-up with the GI specialist regarding recent symptoms and ER visit.  Today your CT shows that you have stones in your gallbladder along with early diverticulitis.  The hospitalist, surgeon and I agree you are stable for discharged for follow-up.  I have given you Augmentin to take as an antibiotic.  If symptoms change or worsen please return to ER.

## 2023-01-23 DIAGNOSIS — K5792 Diverticulitis of intestine, part unspecified, without perforation or abscess without bleeding: Secondary | ICD-10-CM | POA: Diagnosis not present

## 2023-01-23 DIAGNOSIS — N179 Acute kidney failure, unspecified: Secondary | ICD-10-CM | POA: Diagnosis not present

## 2023-01-23 DIAGNOSIS — K5900 Constipation, unspecified: Secondary | ICD-10-CM | POA: Diagnosis not present

## 2023-01-23 DIAGNOSIS — R0789 Other chest pain: Secondary | ICD-10-CM | POA: Diagnosis not present

## 2023-01-23 DIAGNOSIS — R7401 Elevation of levels of liver transaminase levels: Secondary | ICD-10-CM | POA: Diagnosis not present

## 2023-01-23 DIAGNOSIS — D649 Anemia, unspecified: Secondary | ICD-10-CM | POA: Diagnosis not present

## 2023-01-23 DIAGNOSIS — D72829 Elevated white blood cell count, unspecified: Secondary | ICD-10-CM | POA: Diagnosis not present

## 2023-01-23 DIAGNOSIS — R1030 Lower abdominal pain, unspecified: Secondary | ICD-10-CM | POA: Diagnosis not present

## 2023-01-23 DIAGNOSIS — R000 Tachycardia, unspecified: Secondary | ICD-10-CM | POA: Diagnosis not present

## 2023-01-23 DIAGNOSIS — R0600 Dyspnea, unspecified: Secondary | ICD-10-CM | POA: Diagnosis not present

## 2023-01-23 DIAGNOSIS — K8080 Other cholelithiasis without obstruction: Secondary | ICD-10-CM | POA: Diagnosis not present

## 2023-01-23 DIAGNOSIS — D510 Vitamin B12 deficiency anemia due to intrinsic factor deficiency: Secondary | ICD-10-CM | POA: Diagnosis not present

## 2023-01-24 NOTE — Progress Notes (Signed)
01/26/2023 Nathan [name] [id number] 09-17-1945  Referring provider: Garlan Fillers, MD Primary GI doctor: Dr. Rhea Belton  ASSESSMENT AND PLAN:   Diverticulitis of colon On Augmentin, no further AB pain Finish ABX, add on fiber, information given Last colon was 2018 recall 3 years but patient states he did not come back for recall due to his heart history, and feeling he did not need repeat screening. Discussed with patient since he had diverticulitis may want to consider repeat to assure no associated inflamed polyp/malignancy, patient declines at this time. Has had some anemia, will check for iron IDA, patient states even if he has iron deficiency anemia, uncertain he would want to get evaluation with endoscopy even if it means possible malignancy. Will get labs, and follow-up with the patient with phone call and discuss further.  Elevated LFTs Thought to be due to shock liver, will recheck LFTs to sure they are downtrending. Right upper quadrant ultrasound with cholelithiasis without sonographic evidence of acute cholecystitis, normal bile ducts.  Anemia On B12 shots, just had one yesterday Has low HGB, will check for IDA Consider EGD/Colon if low, patient states he is still hesitant but would consider endoscopic evaluation.   History of gastric ulcer Lifestyle changes discussed, avoid NSAIDS, ETOH Weight loss discussed with the patient Smoking cessation discussed in detail Well controlled on medication, no symptoms  Coronary artery disease involving autologous vein coronary bypass graft without angina pectoris cardiac cath 02/2016 with occluded SVG to RCA, severe 3 vessel CAD, patent LIMA to LAD, patent SVG to large OM2 On imdur with no CP, SOB 08/21/2021 EF 60-65% The aortic valve has been repaired/ replaced. Aortic valve regurgitation is not visualized. No aortic stenosis is present.  Type 2 diabetes mellitus with hyperlipidemia [facility]) On medication  Patient Care  Team: Garlan Fillers, MD as PCP - General (Internal Medicine) Jake Bathe, MD as PCP - Cardiology (Cardiology)  HISTORY OF PRESENT ILLNESS: 77 y.o. male with a past medical history of CAD s/p CABG 2010, valve replacement, DM, HLD, OSA not on CPAP, 2010 gastric ulcer, and others listed below presents for evaluation of diverticulitis.   Follows with Dr. Anne Fu, last seen 07/20/2022, cardiac cath 02/2016 with occluded SVG to RCA, severe 3 vessel CAD, patent LIMA to LAD, patent SVG to large OM2, On imdur.  He has been doing exercise on TV 30 mins a day without CP or EGD.  08/21/2021 EF 60-65% The aortic valve has been repaired/ replaced. Aortic valve regurgitation is not visualized. No aortic stenosis is present.  05/04/2017 Colon with Dr. Rhea Belton for high risk screening, prep good. 3 mm polyp, 3 polyps 1-2 mm descending colon, diverticulosis, anal papilla, recall 3 years. 04/2020 01/21/2023 ER visit with WBC 12.7, HGB 11.7, AST 390, ALT 206, alk phos 116, total bili 1.6, BUN 20. Cr 1.52 LFT elevation thought to be due to shock liver, has outpatient follow up with general surgery,  CT ABP with contrast diverticulosis possible early/mild diverticulitis no complications, cholelithiasis with distended GB no overt inflammation, aortic atherosclerosis and left main with 3 vessel CAD Normal pancrease. No ductal dilation of biliary ducts.  RUQ [place] Cholelithiasis without sonographic evidence of acute cholecystitis.   He was discharged on Augumentin and is still on it.  He has not had any further AB pain since the ER.  He had 3 Bm's yesterday, 2 formed and one loose stools, 1 this AM loose. No blood in the stool.  No fever, chills.  No  SOB, no chest pain.  No GERD,no melena, no dysphagia.   He denies blood thinner use.  He denies NSAID use.  He denies ETOH use.   He denies tobacco use.  He denies drug use.    He  reports that he has never smoked. He has never used smokeless tobacco. He reports  that he does not drink alcohol and does not use drugs.  RELEVANT LABS AND IMAGING: CBC    Component Value Date/Time   WBC 12.7 (H) 01/21/2023 0329   RBC 4.20 (L) 01/21/2023 0329   HGB 11.7 (L) 01/21/2023 0329   HCT 35.8 (L) 01/21/2023 0329   PLT 247 01/21/2023 0329   MCV 85.2 01/21/2023 0329   MCH 27.9 01/21/2023 0329   MCHC 32.7 01/21/2023 0329   RDW 16.2 (H) 01/21/2023 0329   LYMPHSABS 1.0 01/21/2023 0329   MONOABS 1.1 (H) 01/21/2023 0329   EOSABS 0.2 01/21/2023 0329   BASOSABS 0.1 01/21/2023 0329   Recent Labs    01/21/23 0329  HGB 11.7*    CMP     Component Value Date/Time   NA 136 01/21/2023 0329   NA 140 07/20/2021 0846   K 4.3 01/21/2023 0329   CL 101 01/21/2023 0329   CO2 20 (L) 01/21/2023 0329   GLUCOSE 133 (H) 01/21/2023 0329   BUN 20 01/21/2023 0329   BUN 18 07/20/2021 0846   CREATININE 1.52 (H) 01/21/2023 0329   CREATININE 1.14 02/25/2016 0858   CALCIUM 9.6 01/21/2023 0329   PROT 6.6 01/21/2023 0329   PROT 6.5 07/20/2021 0846   ALBUMIN 3.6 01/21/2023 0329   ALBUMIN 4.5 07/20/2021 0846   AST 390 (H) 01/21/2023 0329   ALT 206 (H) 01/21/2023 0329   ALKPHOS 116 01/21/2023 0329   BILITOT 1.6 (H) 01/21/2023 0329   BILITOT 0.5 07/20/2021 0846   GFRNONAA 47 (L) 01/21/2023 0329   GFRAA 59 (L) 04/04/2018 1604      Latest Ref Rng & Units 01/21/2023    3:29 AM 07/20/2021    8:46 AM 11/19/2009    9:14 AM  Hepatic Function  Total Protein 6.5 - 8.1 g/dL 6.6  6.5  6.8   Albumin 3.5 - 5.0 g/dL 3.6  4.5  4.0   AST 15 - 41 U/L 390  19  22   ALT 0 - 44 U/L 206  12  15   Alk Phosphatase 38 - 126 U/L 116  58  37   Total Bilirubin 0.3 - 1.2 mg/dL 1.6  0.5  1.0   Bilirubin, Direct 0.0 - 0.3 mg/dL   0.1       Current Medications:   Current Outpatient Medications (Endocrine & Metabolic):    glipiZIDE (GLUCOTROL) 5 MG tablet, Take 5 mg by mouth every morning.   metFORMIN (GLUCOPHAGE) 1000 MG tablet, Take 1,000 mg by mouth daily with breakfast.  Current  Outpatient Medications (Cardiovascular):    amLODipine (NORVASC) 5 MG tablet, Take 5 mg by mouth every morning.   atorvastatin (LIPITOR) 20 MG tablet, Take 20 mg by mouth daily.   hydrochlorothiazide (MICROZIDE) 12.5 MG capsule, Take 12.5 mg by mouth daily. Reported on 08/20/2015   isosorbide mononitrate (IMDUR) 30 MG 24 hr tablet, TAKE 1 TABLET (30 MG TOTAL) BY MOUTH DAILY. **DO NOT CRUSH**   metoprolol (LOPRESSOR) 50 MG tablet, Take 50 mg by mouth 2 (two) times daily.   minoxidil (LONITEN) 10 MG tablet, Take 10 mg by mouth daily.    nitroGLYCERIN (NITROSTAT) 0.4 MG SL tablet,  Place 1 tablet under the tongue as needed.   Current Outpatient Medications (Analgesics):    acetaminophen (TYLENOL) 500 MG tablet, Take 500 mg by mouth daily as needed for moderate pain.   allopurinol (ZYLOPRIM) 300 MG tablet, Take 300 mg by mouth daily.   aspirin 81 MG tablet, Take 1 tablet (81 mg total) by mouth daily.   HYDROcodone-acetaminophen (VICODIN) 5-500 MG per tablet, Take 1 tablet by mouth daily as needed for pain.    oxyCODONE-acetaminophen (PERCOCET/ROXICET) 5-325 MG tablet, Take 1 tablet by mouth daily as needed for moderate pain or severe pain.    meloxicam (MOBIC) 15 MG tablet, Take 15 mg by mouth as needed for pain. (Patient not taking: Reported on 01/21/2023)  Current Outpatient Medications (Hematological):    Cyanocobalamin (VITAMIN B-12 IJ), Inject 1,000 mcg into the muscle every 30 (thirty) days. Amt/dose is unknown  Current Outpatient Medications (Other):    ALPRAZolam (XANAX) 0.25 MG tablet, Take 0.25 mg by mouth daily as needed for anxiety.   amoxicillin-clavulanate (AUGMENTIN) 875-125 MG tablet, Take 1 tablet by mouth every 12 (twelve) hours.   Ascorbic Acid (VITAMIN C) 1000 MG tablet, Take 1,000 mg by mouth daily.   BAYER CONTOUR TEST test strip, USE 1 STRIP THREE TIMES DAILY TO CHECK BLOOD SUGARS.   Cholecalciferol 25 MCG (1000 UT) capsule, Take 10,000 Units by mouth daily.   co-enzyme Q-10  30 MG capsule, Take 200 mg by mouth 2 (two) times daily.    cycloSPORINE (RESTASIS) 0.05 % ophthalmic emulsion, Place 1 drop into both eyes 2 (two) times daily.   fish oil-omega-3 fatty acids 1000 MG capsule, Take 2 g by mouth daily.   fluorouracil (EFUDEX) 5 % cream, Apply topically daily.   ketoconazole (NIZORAL) 2 % cream, 2 (two) times daily.   Multiple Vitamin (MULTIVITAMIN) tablet, Take 1 tablet by mouth daily.   omeprazole (PRILOSEC) 40 MG capsule, Take 40 mg by mouth 2 (two) times daily.   trolamine salicylate (ASPERCREME) 10 % cream, Apply 1 application topically as needed for muscle pain.  Medical History:  Past Medical History:  Diagnosis Date   Allergy    Aortic stenosis    Arthritis    Balance problem 04/26/2022   Blood transfusion without reported diagnosis    CAD (coronary artery disease)    Cataract    both eyes surgically removed   Diabetes mellitus, type 2 (HCC)    Diverticulosis of colon (without mention of hemorrhage)    Duodenal ulcer perforation (HCC)    blood transfusion   Flesh-eating bacteria (HCC) 1995   GERD (gastroesophageal reflux disease)    Glaucoma    pre glaucoma on Xalatan drops   Heart murmur    History of diabetic retinopathy    History of gastrointestinal bleeding    History of necrotizing fasciitis    History of prosthetic aortic valve    Hypercholesteremia    Myocardial infarction (HCC) 02/23/2016   Neuropathic pain 04/26/2022   Nightmares    CHRONIC   OSA (obstructive sleep apnea)    borderline no CPAP    Personal history of colonic polyps 11/23/2009   TUBULAR ADENOMA   Postoperative anemia    Sinus bradycardia    Sleep apnea    no cpap   Stomach ulcer    Hx of   Systolic hypertension    Vitamin B12 deficiency    Allergies:  Allergies  Allergen Reactions   Shellfish-Derived Products Anaphylaxis   Zolpidem Tartrate Other (See Comments)  Hallucinations     Surgical History:  He  has a past surgical history that  includes Retinal laser procedure; Coronary artery bypass graft (2010); Scrotal surgery (1995); Hernia repair (1972); Aortic valve replacement; flesh eating disease surgery ; Umbilical hernia repair (N/A, 04/10/2013); Cataract extraction w/ intraocular lens implant; Blepharoplasty (11-24-14); Cardiac catheterization (N/A, 03/01/2016); Colonoscopy; and Polypectomy. Family History:  His family history includes Alcohol abuse in his father; Cirrhosis in his father; Colon cancer (age of onset: 12) in his mother; Diabetes in his mother and sister; Hypertension in his mother and sister; Rectal cancer in his mother.  REVIEW OF SYSTEMS  : All other systems reviewed and negative except where noted in the History of Present Illness.  PHYSICAL EXAM: BP 130/74   Pulse 94   Ht 5\' 7"  (1.702 m)   Wt 152 lb (68.9 kg)   SpO2 96%   BMI 23.81 kg/m  General Appearance: Well nourished, in no apparent distress. Head:   Normocephalic and atraumatic. Eyes:  sclerae anicteric,conjunctive pink  Respiratory: Respiratory effort normal, BS equal bilaterally without rales, rhonchi, wheezing. Cardio: RRR with no MRGs. Peripheral pulses intact.  Abdomen: Soft,  Obese ,active bowel sounds. No tenderness . Without guarding and Without rebound. No masses. Rectal: Not evaluated Musculoskeletal: Full ROM, Normal gait. Without edema. Skin:  Dry and intact without significant lesions or rashes Neuro: Alert and  oriented x4;  No focal deficits. Psych:  Cooperative. Normal mood and affect.    Doree Albee, PA-C 8:59 AM

## 2023-01-26 ENCOUNTER — Other Ambulatory Visit: Payer: Medicare Other

## 2023-01-26 ENCOUNTER — Ambulatory Visit (INDEPENDENT_AMBULATORY_CARE_PROVIDER_SITE_OTHER): Payer: Medicare Other | Admitting: Physician Assistant

## 2023-01-26 ENCOUNTER — Encounter: Payer: Self-pay | Admitting: Physician Assistant

## 2023-01-26 VITALS — BP 130/74 | HR 94 | Ht 67.0 in | Wt 152.0 lb

## 2023-01-26 DIAGNOSIS — Z8711 Personal history of peptic ulcer disease: Secondary | ICD-10-CM | POA: Diagnosis not present

## 2023-01-26 DIAGNOSIS — I25810 Atherosclerosis of coronary artery bypass graft(s) without angina pectoris: Secondary | ICD-10-CM

## 2023-01-26 DIAGNOSIS — R7989 Other specified abnormal findings of blood chemistry: Secondary | ICD-10-CM

## 2023-01-26 DIAGNOSIS — K5732 Diverticulitis of large intestine without perforation or abscess without bleeding: Secondary | ICD-10-CM | POA: Diagnosis not present

## 2023-01-26 DIAGNOSIS — E1169 Type 2 diabetes mellitus with other specified complication: Secondary | ICD-10-CM | POA: Diagnosis not present

## 2023-01-26 DIAGNOSIS — E785 Hyperlipidemia, unspecified: Secondary | ICD-10-CM

## 2023-01-26 DIAGNOSIS — D649 Anemia, unspecified: Secondary | ICD-10-CM

## 2023-01-26 LAB — CBC WITH DIFFERENTIAL/PLATELET
Basophils Absolute: 0.1 10*3/uL (ref 0.0–0.1)
Basophils Relative: 0.9 % (ref 0.0–3.0)
Eosinophils Absolute: 0.5 10*3/uL (ref 0.0–0.7)
Eosinophils Relative: 6.6 % — ABNORMAL HIGH (ref 0.0–5.0)
HCT: 38.7 % — ABNORMAL LOW (ref 39.0–52.0)
Hemoglobin: 12.3 g/dL — ABNORMAL LOW (ref 13.0–17.0)
Lymphocytes Relative: 17.7 % (ref 12.0–46.0)
Lymphs Abs: 1.4 10*3/uL (ref 0.7–4.0)
MCHC: 31.7 g/dL (ref 30.0–36.0)
MCV: 88.8 fl (ref 78.0–100.0)
Monocytes Absolute: 0.8 10*3/uL (ref 0.1–1.0)
Monocytes Relative: 9.8 % (ref 3.0–12.0)
Neutro Abs: 5.1 10*3/uL (ref 1.4–7.7)
Neutrophils Relative %: 65 % (ref 43.0–77.0)
Platelets: 255 10*3/uL (ref 150.0–400.0)
RBC: 4.36 Mil/uL (ref 4.22–5.81)
RDW: 17.7 % — ABNORMAL HIGH (ref 11.5–15.5)
WBC: 7.9 10*3/uL (ref 4.0–10.5)

## 2023-01-26 LAB — HEPATIC FUNCTION PANEL
ALT: 93 U/L — ABNORMAL HIGH (ref 0–53)
AST: 31 U/L (ref 0–37)
Albumin: 4.3 g/dL (ref 3.5–5.2)
Alkaline Phosphatase: 141 U/L — ABNORMAL HIGH (ref 39–117)
Bilirubin, Direct: 0.4 mg/dL — ABNORMAL HIGH (ref 0.0–0.3)
Total Bilirubin: 1.2 mg/dL (ref 0.2–1.2)
Total Protein: 7.4 g/dL (ref 6.0–8.3)

## 2023-01-26 LAB — BASIC METABOLIC PANEL
BUN: 18 mg/dL (ref 6–23)
CO2: 28 mEq/L (ref 19–32)
Calcium: 10.2 mg/dL (ref 8.4–10.5)
Chloride: 101 mEq/L (ref 96–112)
Creatinine, Ser: 1.44 mg/dL (ref 0.40–1.50)
GFR: 46.82 mL/min — ABNORMAL LOW (ref >60.00)
Glucose, Bld: 155 mg/dL — ABNORMAL HIGH (ref 70–99)
Potassium: 4 mEq/L (ref 3.5–5.1)
Sodium: 140 mEq/L (ref 135–145)

## 2023-01-26 LAB — IBC + FERRITIN
Ferritin: 73 ng/mL (ref 22.0–322.0)
Iron: 80 ug/dL (ref 42–165)
Saturation Ratios: 31.7 % (ref 20.0–50.0)
TIBC: 252 ug/dL (ref 250.0–450.0)
Transferrin: 180 mg/dL — ABNORMAL LOW (ref 212.0–360.0)

## 2023-01-26 NOTE — Progress Notes (Signed)
Addendum: Reviewed and agree with assessment and management plan. Pyrtle, Jay M, MD  

## 2023-01-26 NOTE — Patient Instructions (Addendum)
Your provider has requested that you go to the basement level for lab work before leaving today. Press "B" on the elevator. The lab is located at the first door on the left as you exit the elevator.  Diverticulosis Diverticulosis is a condition that develops when small pouches (diverticula) form in the wall of the large intestine (colon). The colon is where water is absorbed and stool (feces) is formed. The pouches form when the inside layer of the colon pushes through weak spots in the outer layers of the colon. You may have a few pouches or many of them. The pouches usually do not cause problems unless they become inflamed or infected. When this happens, the condition is called diverticulitis- this is left lower quadrant pain, diarrhea, fever, chills, nausea or vomiting.  If this occurs please call the office or go to the hospital. Sometimes these patches without inflammation can also have painless bleeding associated with them, if this happens please call the office or go to the hospital. Preventing constipation and increasing fiber can help reduce diverticula and prevent complications. Even if you feel you have a high-fiber diet, suggest getting on Benefiber or Cirtracel 2 times daily.  Advised to go to the ER if there is any severe abdominal pain, unable to hold down food/water, blood in stool or vomit, chest pain, shortness of breath, or any worsening symptoms.   We have decided to not get the colonoscopy, if you change your mind call our office or if you have symptoms of rectal bleeding, change in bowel habits, please call our office.  Will check for low iron, if this is low, would suggest EGD and colonoscopy.  _______________________________________________________  If your blood pressure at your visit was 140/90 or greater, please contact your primary care physician to follow up on this.  _______________________________________________________  If you are age 9 or older, your body mass  index should be between 23-30. Your Body mass index is 23.81 kg/m. If this is out of the aforementioned range listed, please consider follow up with your Primary Care Provider.  If you are age 11 or younger, your body mass index should be between 19-25. Your Body mass index is 23.81 kg/m. If this is out of the aformentioned range listed, please consider follow up with your Primary Care Provider.   ________________________________________________________  The [place] [facility] providers would like to encourage you to use [facility] to communicate with providers for non-urgent requests or questions.  Due to long hold times on the telephone, sending your provider a message by [facility] may be a faster and more efficient way to get a response.  Please allow 48 business hours for a response.  Please remember that this is for non-urgent requests.  _______________________________________________________  It was a pleasure to see you today!  Thank you for trusting me with your gastrointestinal care!

## 2023-01-27 ENCOUNTER — Telehealth: Payer: Self-pay

## 2023-01-27 NOTE — Telephone Encounter (Signed)
Transition Care Management Unsuccessful Follow-up Telephone Call  Date of discharge and from where:  01/21/2023 [facility]  Attempts:  2nd Attempt  Reason for unsuccessful TCM follow-up call:  Unable to leave message   [facility]  [facility] Population Health Community Resource Care Guide   [email or web address]  ?? [phone]   Website: [email or web address]  [email or web address]

## 2023-01-27 NOTE — Telephone Encounter (Signed)
Transition Care Management Unsuccessful Follow-up Telephone Call  Date of discharge and from where:  01/21/2023 [facility]  Attempts:  1st Attempt  Reason for unsuccessful TCM follow-up call:  No answer/busy   [facility]  [facility] Population Health Community Resource Care Guide   [email or web address]  ?? [phone]   Website: [email or web address]  [email or web address]

## 2023-02-06 DIAGNOSIS — R7989 Other specified abnormal findings of blood chemistry: Secondary | ICD-10-CM | POA: Diagnosis not present

## 2023-02-06 DIAGNOSIS — E785 Hyperlipidemia, unspecified: Secondary | ICD-10-CM | POA: Diagnosis not present

## 2023-02-06 DIAGNOSIS — I10 Essential (primary) hypertension: Secondary | ICD-10-CM | POA: Diagnosis not present

## 2023-02-13 DIAGNOSIS — H409 Unspecified glaucoma: Secondary | ICD-10-CM | POA: Diagnosis not present

## 2023-02-13 DIAGNOSIS — E113493 Type 2 diabetes mellitus with severe nonproliferative diabetic retinopathy without macular edema, bilateral: Secondary | ICD-10-CM | POA: Diagnosis not present

## 2023-02-13 DIAGNOSIS — H401192 Primary open-angle glaucoma, unspecified eye, moderate stage: Secondary | ICD-10-CM | POA: Diagnosis not present

## 2023-02-13 DIAGNOSIS — H43813 Vitreous degeneration, bilateral: Secondary | ICD-10-CM | POA: Diagnosis not present

## 2023-02-15 ENCOUNTER — Encounter: Payer: Self-pay | Admitting: Podiatry

## 2023-02-15 ENCOUNTER — Ambulatory Visit (INDEPENDENT_AMBULATORY_CARE_PROVIDER_SITE_OTHER): Payer: Medicare Other | Admitting: Podiatry

## 2023-02-15 DIAGNOSIS — M79674 Pain in right toe(s): Secondary | ICD-10-CM | POA: Diagnosis not present

## 2023-02-15 DIAGNOSIS — E1142 Type 2 diabetes mellitus with diabetic polyneuropathy: Secondary | ICD-10-CM

## 2023-02-15 DIAGNOSIS — M79675 Pain in left toe(s): Secondary | ICD-10-CM

## 2023-02-15 DIAGNOSIS — B351 Tinea unguium: Secondary | ICD-10-CM

## 2023-02-15 NOTE — Progress Notes (Signed)
  Subjective:  Patient ID: Nathan [name], male    DOB: Aug 03, 1945,   MRN: [id number]  No chief complaint on file.   77 y.o. male presents for concern of thickened elongated and painful nails that are difficult to trim. Requesting to have them trimmed today. Relates burning and tingling in their feet. Patient is diabetic and last A1c was  Lab Results  Component Value Date   HGBA1C 6.9 (H) 04/10/2013   .   PCP:  Garlan Fillers, MD    . Denies any other pedal complaints. Denies n/v/f/c.   Past Medical History:  Diagnosis Date   Allergy    Aortic stenosis    Arthritis    Balance problem 04/26/2022   Blood transfusion without reported diagnosis    CAD (coronary artery disease)    Cataract    both eyes surgically removed   Diabetes mellitus, type 2 (HCC)    Diverticulosis of colon (without mention of hemorrhage)    Duodenal ulcer perforation (HCC)    blood transfusion   Flesh-eating bacteria (HCC) 1995   GERD (gastroesophageal reflux disease)    Glaucoma    pre glaucoma on Xalatan drops   Heart murmur    History of diabetic retinopathy    History of gastrointestinal bleeding    History of necrotizing fasciitis    History of prosthetic aortic valve    Hypercholesteremia    Myocardial infarction (HCC) 02/23/2016   Neuropathic pain 04/26/2022   Nightmares    CHRONIC   OSA (obstructive sleep apnea)    borderline no CPAP    Personal history of colonic polyps 11/23/2009   TUBULAR ADENOMA   Postoperative anemia    Sinus bradycardia    Sleep apnea    no cpap   Stomach ulcer    Hx of   Systolic hypertension    Vitamin B12 deficiency     Objective:  Physical Exam: Vascular: DP/PT pulses 2/4 bilateral. CFT <3 seconds. Absent hair growth on digits. Edema noted to bilateral lower extremities. Xerosis noted bilaterally.  Skin. No lacerations or abrasions bilateral feet. Nails 1-5 bilateral  are thickened discolored and elongated with subungual debris.  Musculoskeletal:  MMT 5/5 bilateral lower extremities in DF, PF, Inversion and Eversion. Deceased ROM in DF of ankle joint.  Neurological: Sensation intact to light touch. Protective sensation diminished bilateral.  .   Assessment:   1. Pain due to onychomycosis of toenails of both feet   2. Diabetic peripheral neuropathy associated with type 2 diabetes mellitus (HCC)      Plan:  Patient was evaluated and treated and all questions answered. -Discussed and educated patient on diabetic foot care, especially with  regards to the vascular, neurological and musculoskeletal systems.  -Stressed the importance of good glycemic control and the detriment of not  controlling glucose levels in relation to the foot. -Discussed supportive shoes at all times and checking feet regularly.  -Mechanically debrided all nails 1-5 bilateral using sterile nail nipper and filed with dremel without incident  -Answered all patient questions -Patient to return  in 3 months for at risk foot care -Patient advised to call the office if any problems or questions arise in the meantime.   Louann Sjogren, DPM

## 2023-02-16 DIAGNOSIS — H04123 Dry eye syndrome of bilateral lacrimal glands: Secondary | ICD-10-CM | POA: Diagnosis not present

## 2023-02-16 DIAGNOSIS — Z961 Presence of intraocular lens: Secondary | ICD-10-CM | POA: Diagnosis not present

## 2023-02-16 DIAGNOSIS — E113493 Type 2 diabetes mellitus with severe nonproliferative diabetic retinopathy without macular edema, bilateral: Secondary | ICD-10-CM | POA: Diagnosis not present

## 2023-02-16 DIAGNOSIS — H401134 Primary open-angle glaucoma, bilateral, indeterminate stage: Secondary | ICD-10-CM | POA: Diagnosis not present

## 2023-02-20 ENCOUNTER — Emergency Department (HOSPITAL_COMMUNITY): Payer: Medicare Other

## 2023-02-20 ENCOUNTER — Inpatient Hospital Stay (HOSPITAL_COMMUNITY)
Admission: EM | Admit: 2023-02-20 | Discharge: 2023-03-13 | DRG: 280 | Disposition: A | Discharge status: Skilled Nursing Facility | Payer: Medicare Other | Attending: Internal Medicine | Admitting: Internal Medicine

## 2023-02-20 ENCOUNTER — Other Ambulatory Visit: Payer: Self-pay

## 2023-02-20 DIAGNOSIS — I959 Hypotension, unspecified: Secondary | ICD-10-CM | POA: Diagnosis not present

## 2023-02-20 DIAGNOSIS — I214 Non-ST elevation (NSTEMI) myocardial infarction: Secondary | ICD-10-CM | POA: Diagnosis not present

## 2023-02-20 DIAGNOSIS — G9341 Metabolic encephalopathy: Secondary | ICD-10-CM | POA: Diagnosis not present

## 2023-02-20 DIAGNOSIS — Z66 Do not resuscitate: Secondary | ICD-10-CM | POA: Diagnosis not present

## 2023-02-20 DIAGNOSIS — I249 Acute ischemic heart disease, unspecified: Secondary | ICD-10-CM | POA: Diagnosis not present

## 2023-02-20 DIAGNOSIS — I480 Paroxysmal atrial fibrillation: Secondary | ICD-10-CM | POA: Diagnosis not present

## 2023-02-20 DIAGNOSIS — I25810 Atherosclerosis of coronary artery bypass graft(s) without angina pectoris: Secondary | ICD-10-CM | POA: Diagnosis present

## 2023-02-20 DIAGNOSIS — Y95 Nosocomial condition: Secondary | ICD-10-CM | POA: Diagnosis not present

## 2023-02-20 DIAGNOSIS — Z91013 Allergy to seafood: Secondary | ICD-10-CM

## 2023-02-20 DIAGNOSIS — Z953 Presence of xenogenic heart valve: Secondary | ICD-10-CM | POA: Diagnosis not present

## 2023-02-20 DIAGNOSIS — N179 Acute kidney failure, unspecified: Secondary | ICD-10-CM | POA: Diagnosis not present

## 2023-02-20 DIAGNOSIS — I5023 Acute on chronic systolic (congestive) heart failure: Secondary | ICD-10-CM | POA: Diagnosis not present

## 2023-02-20 DIAGNOSIS — R918 Other nonspecific abnormal finding of lung field: Secondary | ICD-10-CM | POA: Diagnosis not present

## 2023-02-20 DIAGNOSIS — I4891 Unspecified atrial fibrillation: Secondary | ICD-10-CM | POA: Diagnosis not present

## 2023-02-20 DIAGNOSIS — I429 Cardiomyopathy, unspecified: Secondary | ICD-10-CM | POA: Diagnosis present

## 2023-02-20 DIAGNOSIS — E1122 Type 2 diabetes mellitus with diabetic chronic kidney disease: Secondary | ICD-10-CM | POA: Diagnosis present

## 2023-02-20 DIAGNOSIS — Z6822 Body mass index (BMI) 22.0-22.9, adult: Secondary | ICD-10-CM

## 2023-02-20 DIAGNOSIS — I5043 Acute on chronic combined systolic (congestive) and diastolic (congestive) heart failure: Secondary | ICD-10-CM | POA: Diagnosis not present

## 2023-02-20 DIAGNOSIS — I25118 Atherosclerotic heart disease of native coronary artery with other forms of angina pectoris: Secondary | ICD-10-CM | POA: Diagnosis not present

## 2023-02-20 DIAGNOSIS — N1832 Chronic kidney disease, stage 3b: Secondary | ICD-10-CM | POA: Diagnosis present

## 2023-02-20 DIAGNOSIS — Z961 Presence of intraocular lens: Secondary | ICD-10-CM | POA: Diagnosis present

## 2023-02-20 DIAGNOSIS — R2689 Other abnormalities of gait and mobility: Secondary | ICD-10-CM | POA: Diagnosis present

## 2023-02-20 DIAGNOSIS — Z8249 Family history of ischemic heart disease and other diseases of the circulatory system: Secondary | ICD-10-CM

## 2023-02-20 DIAGNOSIS — R531 Weakness: Secondary | ICD-10-CM | POA: Diagnosis not present

## 2023-02-20 DIAGNOSIS — E1349 Other specified diabetes mellitus with other diabetic neurological complication: Secondary | ICD-10-CM

## 2023-02-20 DIAGNOSIS — H409 Unspecified glaucoma: Secondary | ICD-10-CM | POA: Diagnosis present

## 2023-02-20 DIAGNOSIS — F0392 Unspecified dementia, unspecified severity, with psychotic disturbance: Secondary | ICD-10-CM | POA: Diagnosis not present

## 2023-02-20 DIAGNOSIS — I672 Cerebral atherosclerosis: Secondary | ICD-10-CM | POA: Diagnosis not present

## 2023-02-20 DIAGNOSIS — D631 Anemia in chronic kidney disease: Secondary | ICD-10-CM | POA: Diagnosis present

## 2023-02-20 DIAGNOSIS — R079 Chest pain, unspecified: Principal | ICD-10-CM

## 2023-02-20 DIAGNOSIS — K8000 Calculus of gallbladder with acute cholecystitis without obstruction: Secondary | ICD-10-CM | POA: Diagnosis not present

## 2023-02-20 DIAGNOSIS — R748 Abnormal levels of other serum enzymes: Secondary | ICD-10-CM | POA: Diagnosis not present

## 2023-02-20 DIAGNOSIS — R0603 Acute respiratory distress: Secondary | ICD-10-CM | POA: Diagnosis present

## 2023-02-20 DIAGNOSIS — J984 Other disorders of lung: Secondary | ICD-10-CM | POA: Diagnosis not present

## 2023-02-20 DIAGNOSIS — K838 Other specified diseases of biliary tract: Secondary | ICD-10-CM | POA: Diagnosis not present

## 2023-02-20 DIAGNOSIS — N1831 Chronic kidney disease, stage 3a: Secondary | ICD-10-CM | POA: Insufficient documentation

## 2023-02-20 DIAGNOSIS — T45515A Adverse effect of anticoagulants, initial encounter: Secondary | ICD-10-CM | POA: Diagnosis not present

## 2023-02-20 DIAGNOSIS — K8020 Calculus of gallbladder without cholecystitis without obstruction: Secondary | ICD-10-CM | POA: Diagnosis not present

## 2023-02-20 DIAGNOSIS — I517 Cardiomegaly: Secondary | ICD-10-CM | POA: Diagnosis not present

## 2023-02-20 DIAGNOSIS — E43 Unspecified severe protein-calorie malnutrition: Secondary | ICD-10-CM | POA: Diagnosis not present

## 2023-02-20 DIAGNOSIS — I255 Ischemic cardiomyopathy: Secondary | ICD-10-CM | POA: Diagnosis not present

## 2023-02-20 DIAGNOSIS — E785 Hyperlipidemia, unspecified: Secondary | ICD-10-CM | POA: Diagnosis present

## 2023-02-20 DIAGNOSIS — Z7982 Long term (current) use of aspirin: Secondary | ICD-10-CM

## 2023-02-20 DIAGNOSIS — S7012XA Contusion of left thigh, initial encounter: Secondary | ICD-10-CM | POA: Diagnosis not present

## 2023-02-20 DIAGNOSIS — I2510 Atherosclerotic heart disease of native coronary artery without angina pectoris: Secondary | ICD-10-CM | POA: Diagnosis not present

## 2023-02-20 DIAGNOSIS — R4189 Other symptoms and signs involving cognitive functions and awareness: Secondary | ICD-10-CM | POA: Diagnosis present

## 2023-02-20 DIAGNOSIS — E876 Hypokalemia: Secondary | ICD-10-CM | POA: Diagnosis present

## 2023-02-20 DIAGNOSIS — R4182 Altered mental status, unspecified: Secondary | ICD-10-CM | POA: Diagnosis not present

## 2023-02-20 DIAGNOSIS — R0602 Shortness of breath: Secondary | ICD-10-CM | POA: Diagnosis not present

## 2023-02-20 DIAGNOSIS — I130 Hypertensive heart and chronic kidney disease with heart failure and stage 1 through stage 4 chronic kidney disease, or unspecified chronic kidney disease: Secondary | ICD-10-CM | POA: Diagnosis not present

## 2023-02-20 DIAGNOSIS — R000 Tachycardia, unspecified: Secondary | ICD-10-CM | POA: Diagnosis not present

## 2023-02-20 DIAGNOSIS — F05 Delirium due to known physiological condition: Secondary | ICD-10-CM | POA: Diagnosis not present

## 2023-02-20 DIAGNOSIS — K810 Acute cholecystitis: Secondary | ICD-10-CM | POA: Insufficient documentation

## 2023-02-20 DIAGNOSIS — R059 Cough, unspecified: Secondary | ICD-10-CM | POA: Diagnosis not present

## 2023-02-20 DIAGNOSIS — R45851 Suicidal ideations: Secondary | ICD-10-CM | POA: Diagnosis not present

## 2023-02-20 DIAGNOSIS — T85590A Other mechanical complication of bile duct prosthesis, initial encounter: Secondary | ICD-10-CM | POA: Diagnosis not present

## 2023-02-20 DIAGNOSIS — S2232XA Fracture of one rib, left side, initial encounter for closed fracture: Secondary | ICD-10-CM | POA: Diagnosis not present

## 2023-02-20 DIAGNOSIS — K828 Other specified diseases of gallbladder: Secondary | ICD-10-CM | POA: Diagnosis not present

## 2023-02-20 DIAGNOSIS — S301XXA Contusion of abdominal wall, initial encounter: Secondary | ICD-10-CM | POA: Insufficient documentation

## 2023-02-20 DIAGNOSIS — I21A1 Myocardial infarction type 2: Secondary | ICD-10-CM | POA: Diagnosis not present

## 2023-02-20 DIAGNOSIS — Z7401 Bed confinement status: Secondary | ICD-10-CM | POA: Diagnosis not present

## 2023-02-20 DIAGNOSIS — R7401 Elevation of levels of liver transaminase levels: Secondary | ICD-10-CM | POA: Diagnosis not present

## 2023-02-20 DIAGNOSIS — R41841 Cognitive communication deficit: Secondary | ICD-10-CM | POA: Diagnosis not present

## 2023-02-20 DIAGNOSIS — D62 Acute posthemorrhagic anemia: Secondary | ICD-10-CM | POA: Diagnosis present

## 2023-02-20 DIAGNOSIS — X58XXXA Exposure to other specified factors, initial encounter: Secondary | ICD-10-CM | POA: Diagnosis not present

## 2023-02-20 DIAGNOSIS — R1011 Right upper quadrant pain: Secondary | ICD-10-CM | POA: Diagnosis not present

## 2023-02-20 DIAGNOSIS — I428 Other cardiomyopathies: Secondary | ICD-10-CM | POA: Diagnosis not present

## 2023-02-20 DIAGNOSIS — Z1152 Encounter for screening for COVID-19: Secondary | ICD-10-CM

## 2023-02-20 DIAGNOSIS — R0789 Other chest pain: Secondary | ICD-10-CM | POA: Diagnosis not present

## 2023-02-20 DIAGNOSIS — Z952 Presence of prosthetic heart valve: Secondary | ICD-10-CM | POA: Diagnosis not present

## 2023-02-20 DIAGNOSIS — G4733 Obstructive sleep apnea (adult) (pediatric): Secondary | ICD-10-CM | POA: Diagnosis present

## 2023-02-20 DIAGNOSIS — I4819 Other persistent atrial fibrillation: Secondary | ICD-10-CM | POA: Diagnosis not present

## 2023-02-20 DIAGNOSIS — J189 Pneumonia, unspecified organism: Secondary | ICD-10-CM | POA: Diagnosis not present

## 2023-02-20 DIAGNOSIS — I5022 Chronic systolic (congestive) heart failure: Secondary | ICD-10-CM | POA: Diagnosis not present

## 2023-02-20 DIAGNOSIS — R262 Difficulty in walking, not elsewhere classified: Secondary | ICD-10-CM | POA: Diagnosis not present

## 2023-02-20 DIAGNOSIS — M6281 Muscle weakness (generalized): Secondary | ICD-10-CM | POA: Diagnosis not present

## 2023-02-20 DIAGNOSIS — Z951 Presence of aortocoronary bypass graft: Secondary | ICD-10-CM | POA: Diagnosis not present

## 2023-02-20 DIAGNOSIS — I252 Old myocardial infarction: Secondary | ICD-10-CM

## 2023-02-20 DIAGNOSIS — I5021 Acute systolic (congestive) heart failure: Secondary | ICD-10-CM | POA: Diagnosis not present

## 2023-02-20 DIAGNOSIS — K219 Gastro-esophageal reflux disease without esophagitis: Secondary | ICD-10-CM | POA: Diagnosis present

## 2023-02-20 DIAGNOSIS — J9601 Acute respiratory failure with hypoxia: Secondary | ICD-10-CM | POA: Diagnosis not present

## 2023-02-20 DIAGNOSIS — Z79899 Other long term (current) drug therapy: Secondary | ICD-10-CM

## 2023-02-20 DIAGNOSIS — R410 Disorientation, unspecified: Secondary | ICD-10-CM | POA: Diagnosis not present

## 2023-02-20 DIAGNOSIS — Z781 Physical restraint status: Secondary | ICD-10-CM

## 2023-02-20 DIAGNOSIS — K5730 Diverticulosis of large intestine without perforation or abscess without bleeding: Secondary | ICD-10-CM | POA: Diagnosis not present

## 2023-02-20 DIAGNOSIS — J849 Interstitial pulmonary disease, unspecified: Secondary | ICD-10-CM | POA: Diagnosis not present

## 2023-02-20 DIAGNOSIS — Z888 Allergy status to other drugs, medicaments and biological substances status: Secondary | ICD-10-CM

## 2023-02-20 DIAGNOSIS — E119 Type 2 diabetes mellitus without complications: Secondary | ICD-10-CM | POA: Diagnosis not present

## 2023-02-20 DIAGNOSIS — E7800 Pure hypercholesterolemia, unspecified: Secondary | ICD-10-CM | POA: Diagnosis present

## 2023-02-20 DIAGNOSIS — I10 Essential (primary) hypertension: Secondary | ICD-10-CM | POA: Diagnosis not present

## 2023-02-20 DIAGNOSIS — Z7984 Long term (current) use of oral hypoglycemic drugs: Secondary | ICD-10-CM

## 2023-02-20 DIAGNOSIS — R7989 Other specified abnormal findings of blood chemistry: Secondary | ICD-10-CM | POA: Diagnosis not present

## 2023-02-20 DIAGNOSIS — I5189 Other ill-defined heart diseases: Secondary | ICD-10-CM | POA: Diagnosis not present

## 2023-02-20 LAB — CBC WITH DIFFERENTIAL/PLATELET
Abs Immature Granulocytes: 0.14 10*3/uL — ABNORMAL HIGH (ref 0.00–0.07)
Basophils Absolute: 0.1 10*3/uL (ref 0.0–0.1)
Basophils Relative: 1 %
Eosinophils Absolute: 0.2 10*3/uL (ref 0.0–0.5)
Eosinophils Relative: 2 %
HCT: 32.3 % — ABNORMAL LOW (ref 39.0–52.0)
Hemoglobin: 10.5 g/dL — ABNORMAL LOW (ref 13.0–17.0)
Immature Granulocytes: 1 %
Lymphocytes Relative: 10 %
Lymphs Abs: 1.3 10*3/uL (ref 0.7–4.0)
MCH: 28.5 pg (ref 26.0–34.0)
MCHC: 32.5 g/dL (ref 30.0–36.0)
MCV: 87.5 fL (ref 80.0–100.0)
Monocytes Absolute: 1.2 10*3/uL — ABNORMAL HIGH (ref 0.1–1.0)
Monocytes Relative: 9 %
Neutro Abs: 10.1 10*3/uL — ABNORMAL HIGH (ref 1.7–7.7)
Neutrophils Relative %: 77 %
Platelets: 175 10*3/uL (ref 150–400)
RBC: 3.69 MIL/uL — ABNORMAL LOW (ref 4.22–5.81)
RDW: 17.1 % — ABNORMAL HIGH (ref 11.5–15.5)
WBC: 12.9 10*3/uL — ABNORMAL HIGH (ref 4.0–10.5)
nRBC: 0 % (ref 0.0–0.2)

## 2023-02-20 LAB — BASIC METABOLIC PANEL
Anion gap: 14 (ref 5–15)
BUN: 16 mg/dL (ref 8–23)
CO2: 20 mmol/L — ABNORMAL LOW (ref 22–32)
Calcium: 8.9 mg/dL (ref 8.9–10.3)
Chloride: 103 mmol/L (ref 98–111)
Creatinine, Ser: 1.28 mg/dL — ABNORMAL HIGH (ref 0.61–1.24)
GFR, Estimated: 58 mL/min — ABNORMAL LOW (ref >60)
Glucose, Bld: 134 mg/dL — ABNORMAL HIGH (ref 70–99)
Potassium: 3.4 mmol/L — ABNORMAL LOW (ref 3.5–5.1)
Sodium: 137 mmol/L (ref 135–145)

## 2023-02-20 LAB — TROPONIN I (HIGH SENSITIVITY): Troponin I (High Sensitivity): 13 ng/L (ref <18)

## 2023-02-20 MED ORDER — NITROGLYCERIN IN D5W 200-5 MCG/ML-% IV SOLN
0.0000 ug/min | INTRAVENOUS | Status: DC
  Administered 2023-02-20: 5 ug/min via INTRAVENOUS
  Administered 2023-02-21: 60 ug/min via INTRAVENOUS
  Filled 2023-02-20 (×2): qty 250

## 2023-02-20 MED ORDER — MORPHINE SULFATE (PF) 4 MG/ML IV SOLN
4.0000 mg | Freq: Once | INTRAVENOUS | Status: AC
  Administered 2023-02-20: 4 mg via INTRAVENOUS
  Filled 2023-02-20: qty 1

## 2023-02-20 MED ORDER — LACTATED RINGERS IV SOLN: INTRAVENOUS | Status: DC

## 2023-02-20 NOTE — ED Provider Notes (Addendum)
[place] [facility] AT [facility] Provider Note   CSN: [id number] Arrival date & time: 02/20/23  2116     History  Chief Complaint  Patient presents with   Chest Pain    Nathan [name] is a 77 y.o. male.  77 year old male presents with chest pain that began today while at rest.  History of coronary artery disease.  Took 1 nitroglycerin which had no effect on his pain.  Has some associated apnea.  Denies any recent cold symptoms.  Called EMS and was given an additional 2 nitroglycerin sublingual which did not improve his pain somewhat.  Pain then came back and was given 4 mg of morphine which did help.  When he arrived here, EMS stated patient became diaphoretic and said his pain is returned.  Does have a history of MI states that this is similar.       Home Medications Prior to Admission medications   Medication Sig Start Date End Date Taking? Authorizing Provider  acetaminophen (TYLENOL) 500 MG tablet Take 500 mg by mouth daily as needed for moderate pain.    [provider]  allopurinol (ZYLOPRIM) 300 MG tablet Take 300 mg by mouth daily.    [provider]  ALPRAZolam Prudy Feeler) 0.25 MG tablet Take 0.25 mg by mouth daily as needed for anxiety.    [provider]  amLODipine (NORVASC) 5 MG tablet Take 5 mg by mouth every morning.    [provider]  amoxicillin-clavulanate (AUGMENTIN) 875-125 MG tablet Take 1 tablet by mouth every 12 (twelve) hours. 01/21/23   Netta Corrigan, PA-C  Ascorbic Acid (VITAMIN C) 1000 MG tablet Take 1,000 mg by mouth daily.    [provider]  aspirin 81 MG tablet Take 1 tablet (81 mg total) by mouth daily. 02/09/16   Jake Bathe, MD  atorvastatin (LIPITOR) 20 MG tablet Take 20 mg by mouth daily. 07/25/15   [provider]  BAYER CONTOUR TEST test strip USE 1 STRIP THREE TIMES DAILY TO CHECK BLOOD SUGARS. 11/17/15   [provider]  Cholecalciferol 25 MCG (1000 UT)  capsule Take 10,000 Units by mouth daily.    [provider]  co-enzyme Q-10 30 MG capsule Take 200 mg by mouth 2 (two) times daily.     [provider]  Cyanocobalamin (VITAMIN B-12 IJ) Inject 1,000 mcg into the muscle every 30 (thirty) days. Amt/dose is unknown    [provider]  cycloSPORINE (RESTASIS) 0.05 % ophthalmic emulsion Place 1 drop into both eyes 2 (two) times daily.    [provider]  fish oil-omega-3 fatty acids 1000 MG capsule Take 2 g by mouth daily.    [provider]  fluorouracil (EFUDEX) 5 % cream Apply topically daily. 06/10/22   [provider]  glipiZIDE (GLUCOTROL) 5 MG tablet Take 5 mg by mouth every morning.    [provider]  hydrochlorothiazide (MICROZIDE) 12.5 MG capsule Take 12.5 mg by mouth daily. Reported on 08/20/2015 10/13/10   Cassell Clement, MD  HYDROcodone-acetaminophen (VICODIN) 5-500 MG per tablet Take 1 tablet by mouth daily as needed for pain.     [provider]  isosorbide mononitrate (IMDUR) 30 MG 24 hr tablet TAKE 1 TABLET (30 MG TOTAL) BY MOUTH DAILY. **DO NOT CRUSH** 07/20/22   Swinyer, Zachary George, NP  ketoconazole (NIZORAL) 2 % cream 2 (two) times daily. 06/10/22   [provider]  meloxicam (MOBIC) 15 MG tablet Take 15 mg by mouth  as needed for pain. 05/26/22   [provider]  metFORMIN (GLUCOPHAGE) 1000 MG tablet Take 1,000 mg by mouth daily with breakfast.    [provider]  metoprolol (LOPRESSOR) 50 MG tablet Take 50 mg by mouth 2 (two) times daily.    [provider]  minoxidil (LONITEN) 10 MG tablet Take 10 mg by mouth daily.  07/20/15   [provider]  Multiple Vitamin (MULTIVITAMIN) tablet Take 1 tablet by mouth daily.    [provider]  nitroGLYCERIN (NITROSTAT) 0.4 MG SL tablet Place 1 tablet under the tongue as needed. 09/01/16   [provider]  omeprazole (PRILOSEC) 40 MG capsule Take 40 mg by mouth 2  (two) times daily. 11/12/15   [provider]  oxyCODONE-acetaminophen (PERCOCET/ROXICET) 5-325 MG tablet Take 1 tablet by mouth daily as needed for moderate pain or severe pain.     [provider]  trolamine salicylate (ASPERCREME) 10 % cream Apply 1 application topically as needed for muscle pain.    [provider]      Allergies    Shellfish-derived products and Zolpidem tartrate    Review of Systems   Review of Systems  All other systems reviewed and are negative.   Physical Exam Updated Vital Signs There were no vitals taken for this visit. Physical Exam Vitals and nursing note reviewed.  Constitutional:      General: He is not in acute distress.    Appearance: Normal appearance. He is well-developed. He is not toxic-appearing.  HENT:     Head: Normocephalic and atraumatic.  Eyes:     General: Lids are normal.     Conjunctiva/sclera: Conjunctivae normal.     Pupils: Pupils are equal, round, and reactive to light.  Neck:     Thyroid: No thyroid mass.     Trachea: No tracheal deviation.  Cardiovascular:     Rate and Rhythm: Normal rate and regular rhythm.     Heart sounds: Normal heart sounds. No murmur heard.    No gallop.  Pulmonary:     Effort: Pulmonary effort is normal. No respiratory distress.     Breath sounds: Normal breath sounds. No stridor. No decreased breath sounds, wheezing, rhonchi or rales.  Abdominal:     General: There is no distension.     Palpations: Abdomen is soft.     Tenderness: There is no abdominal tenderness. There is no rebound.  Musculoskeletal:        General: No tenderness. Normal range of motion.     Cervical back: Normal range of motion and neck supple.  Skin:    General: Skin is warm and moist.     Findings: No abrasion or rash.  Neurological:     Mental Status: He is alert and oriented to person, place, and time. Mental status is at baseline.     GCS: GCS eye subscore is 4. GCS verbal subscore is 5. GCS  motor subscore is 6.     Cranial Nerves: Cranial nerves are intact. No cranial nerve deficit.     Sensory: No sensory deficit.     Motor: Motor function is intact.  Psychiatric:        Attention and Perception: Attention normal.        Speech: Speech normal.        Behavior: Behavior normal.     ED Results / Procedures / Treatments   Labs (all labs ordered are listed, but only abnormal results are displayed) Labs Reviewed  CBC WITH DIFFERENTIAL/PLATELET  BASIC METABOLIC PANEL  TROPONIN I (HIGH SENSITIVITY)    EKG EKG Interpretation Date/Time:  Monday February 20 2023 22:19:34 EDT Ventricular Rate:  86 PR Interval:  238 QRS Duration:  115 QT Interval:  405 QTC Calculation: 485 R Axis:   -46  Text Interpretation: Sinus rhythm Prolonged PR interval Incomplete left bundle branch block Anterior Q waves, possibly due to ILBBB Confirmed by Lorre Nick ([id number]) on 02/20/2023 10:21:49 PM  Radiology No results found.  Procedures Procedures    Medications Ordered in ED Medications  lactated ringers infusion (has no administration in time range)    ED Course/ Medical Decision Making/ A&P                                 Medical Decision Making Amount and/or Complexity of Data Reviewed Labs: ordered. Radiology: ordered. ECG/medicine tests: ordered.  Risk Prescription drug management.  Patient is EKG per interpretation shows no sign of acute ischemic changes.  Chest x-ray per interpretation shows some patchy opacities.  Likely chronic coronary to radiology also reviewed the films.  Patient continued to endorse chest pain similar to his prior MI.  Given nitroglycerin as well as morphine.  Pain is somewhat improved but still remains.  First troponin here is negative.  Repeated his EKG and does show slight deepening ST depression in the lateral leads.  Concerning for ischemia.  Started on heparin drip.  Will consult cardiology  CRITICAL CARE Performed by: Toy Baker Total critical care time: 50 minutes Critical care time was exclusive of separately billable procedures and treating other patients. Critical care was necessary to treat or prevent imminent or life-threatening deterioration. Critical care was time spent personally by me on the following activities: development of treatment plan with patient and/or surrogate as well as nursing, discussions with consultants, evaluation of patient's response to treatment, examination of patient, obtaining history from patient or surrogate, ordering and performing treatments and interventions, ordering and review of laboratory studies, ordering and review of radiographic studies, pulse oximetry and re-evaluation of patient's condition.         Final Clinical Impression(s) / ED Diagnoses Final diagnoses:  None    Rx / DC Orders ED Discharge Orders     None         Lorre Nick, MD 02/20/23 6045    Lorre Nick, MD 02/20/23 2349

## 2023-02-20 NOTE — ED Provider Notes (Signed)
11:58 PM Assumed care from Dr. Freida Busman, please see their note for full history, physical and decision making until this point. In brief this is a 77 y.o. year old male who presented to the ED tonight with Chest Pain     CP similar to previous ACS, ongoing CP. Possibly mild changes on serial ECG's. On heparin/NTG infusion but still ongoing cp even with morphine intermittently. Fellow to see, pending appropriate service for admit.   Discharge instructions, including strict return precautions for new or worsening symptoms, given. Patient and/or family verbalized understanding and agreement with the plan as described.   Labs, studies and imaging reviewed by myself and considered in medical decision making if ordered. Imaging interpreted by radiology.  Labs Reviewed  CBC WITH DIFFERENTIAL/PLATELET - Abnormal; Notable for the following components:      Result Value   WBC 12.9 (*)    RBC 3.69 (*)    Hemoglobin 10.5 (*)    HCT 32.3 (*)    RDW 17.1 (*)    Neutro Abs 10.1 (*)    Monocytes Absolute 1.2 (*)    Abs Immature Granulocytes 0.14 (*)    All other components within normal limits  BASIC METABOLIC PANEL - Abnormal; Notable for the following components:   Potassium 3.4 (*)    CO2 20 (*)    Glucose, Bld 134 (*)    Creatinine, Ser 1.28 (*)    GFR, Estimated 58 (*)    All other components within normal limits  TROPONIN I (HIGH SENSITIVITY)  TROPONIN I (HIGH SENSITIVITY)    DG Chest Port 1 View  Final Result      No follow-ups on file.

## 2023-02-21 ENCOUNTER — Inpatient Hospital Stay (HOSPITAL_COMMUNITY): Payer: Medicare Other

## 2023-02-21 ENCOUNTER — Other Ambulatory Visit (HOSPITAL_COMMUNITY): Payer: Medicare Other

## 2023-02-21 DIAGNOSIS — N1832 Chronic kidney disease, stage 3b: Secondary | ICD-10-CM | POA: Diagnosis present

## 2023-02-21 DIAGNOSIS — Z66 Do not resuscitate: Secondary | ICD-10-CM | POA: Diagnosis not present

## 2023-02-21 DIAGNOSIS — I214 Non-ST elevation (NSTEMI) myocardial infarction: Secondary | ICD-10-CM | POA: Diagnosis present

## 2023-02-21 DIAGNOSIS — I25118 Atherosclerotic heart disease of native coronary artery with other forms of angina pectoris: Secondary | ICD-10-CM

## 2023-02-21 DIAGNOSIS — R262 Difficulty in walking, not elsewhere classified: Secondary | ICD-10-CM | POA: Diagnosis not present

## 2023-02-21 DIAGNOSIS — R0603 Acute respiratory distress: Secondary | ICD-10-CM

## 2023-02-21 DIAGNOSIS — I255 Ischemic cardiomyopathy: Secondary | ICD-10-CM | POA: Diagnosis not present

## 2023-02-21 DIAGNOSIS — R410 Disorientation, unspecified: Secondary | ICD-10-CM | POA: Diagnosis not present

## 2023-02-21 DIAGNOSIS — K828 Other specified diseases of gallbladder: Secondary | ICD-10-CM | POA: Diagnosis not present

## 2023-02-21 DIAGNOSIS — I10 Essential (primary) hypertension: Secondary | ICD-10-CM | POA: Diagnosis not present

## 2023-02-21 DIAGNOSIS — I2510 Atherosclerotic heart disease of native coronary artery without angina pectoris: Secondary | ICD-10-CM | POA: Diagnosis not present

## 2023-02-21 DIAGNOSIS — I480 Paroxysmal atrial fibrillation: Secondary | ICD-10-CM | POA: Diagnosis present

## 2023-02-21 DIAGNOSIS — I5043 Acute on chronic combined systolic (congestive) and diastolic (congestive) heart failure: Secondary | ICD-10-CM | POA: Diagnosis not present

## 2023-02-21 DIAGNOSIS — F05 Delirium due to known physiological condition: Secondary | ICD-10-CM | POA: Diagnosis not present

## 2023-02-21 DIAGNOSIS — I428 Other cardiomyopathies: Secondary | ICD-10-CM | POA: Diagnosis not present

## 2023-02-21 DIAGNOSIS — E785 Hyperlipidemia, unspecified: Secondary | ICD-10-CM | POA: Diagnosis not present

## 2023-02-21 DIAGNOSIS — R1011 Right upper quadrant pain: Secondary | ICD-10-CM | POA: Diagnosis not present

## 2023-02-21 DIAGNOSIS — R531 Weakness: Secondary | ICD-10-CM | POA: Diagnosis not present

## 2023-02-21 DIAGNOSIS — K5730 Diverticulosis of large intestine without perforation or abscess without bleeding: Secondary | ICD-10-CM | POA: Diagnosis not present

## 2023-02-21 DIAGNOSIS — R079 Chest pain, unspecified: Secondary | ICD-10-CM | POA: Diagnosis present

## 2023-02-21 DIAGNOSIS — R059 Cough, unspecified: Secondary | ICD-10-CM | POA: Diagnosis not present

## 2023-02-21 DIAGNOSIS — E43 Unspecified severe protein-calorie malnutrition: Secondary | ICD-10-CM | POA: Diagnosis not present

## 2023-02-21 DIAGNOSIS — I21A1 Myocardial infarction type 2: Secondary | ICD-10-CM | POA: Diagnosis present

## 2023-02-21 DIAGNOSIS — N179 Acute kidney failure, unspecified: Secondary | ICD-10-CM | POA: Diagnosis present

## 2023-02-21 DIAGNOSIS — S2232XA Fracture of one rib, left side, initial encounter for closed fracture: Secondary | ICD-10-CM | POA: Diagnosis not present

## 2023-02-21 DIAGNOSIS — I5022 Chronic systolic (congestive) heart failure: Secondary | ICD-10-CM | POA: Diagnosis not present

## 2023-02-21 DIAGNOSIS — K8000 Calculus of gallbladder with acute cholecystitis without obstruction: Secondary | ICD-10-CM | POA: Diagnosis not present

## 2023-02-21 DIAGNOSIS — Z7401 Bed confinement status: Secondary | ICD-10-CM | POA: Diagnosis not present

## 2023-02-21 DIAGNOSIS — N1831 Chronic kidney disease, stage 3a: Secondary | ICD-10-CM | POA: Diagnosis not present

## 2023-02-21 DIAGNOSIS — I5189 Other ill-defined heart diseases: Secondary | ICD-10-CM | POA: Diagnosis not present

## 2023-02-21 DIAGNOSIS — R0602 Shortness of breath: Secondary | ICD-10-CM | POA: Diagnosis not present

## 2023-02-21 DIAGNOSIS — R4182 Altered mental status, unspecified: Secondary | ICD-10-CM | POA: Diagnosis not present

## 2023-02-21 DIAGNOSIS — K8020 Calculus of gallbladder without cholecystitis without obstruction: Secondary | ICD-10-CM | POA: Diagnosis not present

## 2023-02-21 DIAGNOSIS — G9341 Metabolic encephalopathy: Secondary | ICD-10-CM | POA: Diagnosis not present

## 2023-02-21 DIAGNOSIS — Y95 Nosocomial condition: Secondary | ICD-10-CM | POA: Diagnosis not present

## 2023-02-21 DIAGNOSIS — T85590A Other mechanical complication of bile duct prosthesis, initial encounter: Secondary | ICD-10-CM | POA: Diagnosis not present

## 2023-02-21 DIAGNOSIS — I517 Cardiomegaly: Secondary | ICD-10-CM | POA: Diagnosis not present

## 2023-02-21 DIAGNOSIS — R7989 Other specified abnormal findings of blood chemistry: Secondary | ICD-10-CM | POA: Diagnosis not present

## 2023-02-21 DIAGNOSIS — I5023 Acute on chronic systolic (congestive) heart failure: Secondary | ICD-10-CM | POA: Diagnosis present

## 2023-02-21 DIAGNOSIS — R45851 Suicidal ideations: Secondary | ICD-10-CM | POA: Diagnosis not present

## 2023-02-21 DIAGNOSIS — K810 Acute cholecystitis: Secondary | ICD-10-CM | POA: Diagnosis not present

## 2023-02-21 DIAGNOSIS — R41841 Cognitive communication deficit: Secondary | ICD-10-CM | POA: Diagnosis not present

## 2023-02-21 DIAGNOSIS — Z952 Presence of prosthetic heart valve: Secondary | ICD-10-CM | POA: Insufficient documentation

## 2023-02-21 DIAGNOSIS — I25810 Atherosclerosis of coronary artery bypass graft(s) without angina pectoris: Secondary | ICD-10-CM | POA: Diagnosis present

## 2023-02-21 DIAGNOSIS — Z1152 Encounter for screening for COVID-19: Secondary | ICD-10-CM | POA: Diagnosis not present

## 2023-02-21 DIAGNOSIS — J189 Pneumonia, unspecified organism: Secondary | ICD-10-CM | POA: Diagnosis not present

## 2023-02-21 DIAGNOSIS — I4891 Unspecified atrial fibrillation: Secondary | ICD-10-CM | POA: Diagnosis not present

## 2023-02-21 DIAGNOSIS — D631 Anemia in chronic kidney disease: Secondary | ICD-10-CM | POA: Diagnosis present

## 2023-02-21 DIAGNOSIS — K838 Other specified diseases of biliary tract: Secondary | ICD-10-CM | POA: Diagnosis not present

## 2023-02-21 DIAGNOSIS — I672 Cerebral atherosclerosis: Secondary | ICD-10-CM | POA: Diagnosis not present

## 2023-02-21 DIAGNOSIS — I4819 Other persistent atrial fibrillation: Secondary | ICD-10-CM | POA: Diagnosis not present

## 2023-02-21 DIAGNOSIS — J9601 Acute respiratory failure with hypoxia: Secondary | ICD-10-CM | POA: Insufficient documentation

## 2023-02-21 DIAGNOSIS — X58XXXA Exposure to other specified factors, initial encounter: Secondary | ICD-10-CM | POA: Diagnosis not present

## 2023-02-21 DIAGNOSIS — M6281 Muscle weakness (generalized): Secondary | ICD-10-CM | POA: Diagnosis not present

## 2023-02-21 DIAGNOSIS — F0392 Unspecified dementia, unspecified severity, with psychotic disturbance: Secondary | ICD-10-CM | POA: Diagnosis not present

## 2023-02-21 DIAGNOSIS — J984 Other disorders of lung: Secondary | ICD-10-CM | POA: Diagnosis not present

## 2023-02-21 DIAGNOSIS — Z951 Presence of aortocoronary bypass graft: Secondary | ICD-10-CM | POA: Diagnosis not present

## 2023-02-21 DIAGNOSIS — I5021 Acute systolic (congestive) heart failure: Secondary | ICD-10-CM | POA: Diagnosis not present

## 2023-02-21 DIAGNOSIS — I249 Acute ischemic heart disease, unspecified: Secondary | ICD-10-CM | POA: Diagnosis not present

## 2023-02-21 DIAGNOSIS — R918 Other nonspecific abnormal finding of lung field: Secondary | ICD-10-CM | POA: Diagnosis not present

## 2023-02-21 DIAGNOSIS — D62 Acute posthemorrhagic anemia: Secondary | ICD-10-CM | POA: Diagnosis not present

## 2023-02-21 DIAGNOSIS — E119 Type 2 diabetes mellitus without complications: Secondary | ICD-10-CM | POA: Diagnosis not present

## 2023-02-21 DIAGNOSIS — Z953 Presence of xenogenic heart valve: Secondary | ICD-10-CM | POA: Diagnosis not present

## 2023-02-21 DIAGNOSIS — R2689 Other abnormalities of gait and mobility: Secondary | ICD-10-CM | POA: Diagnosis present

## 2023-02-21 DIAGNOSIS — S7012XA Contusion of left thigh, initial encounter: Secondary | ICD-10-CM | POA: Diagnosis not present

## 2023-02-21 DIAGNOSIS — E1122 Type 2 diabetes mellitus with diabetic chronic kidney disease: Secondary | ICD-10-CM | POA: Diagnosis present

## 2023-02-21 DIAGNOSIS — I429 Cardiomyopathy, unspecified: Secondary | ICD-10-CM | POA: Diagnosis present

## 2023-02-21 DIAGNOSIS — I130 Hypertensive heart and chronic kidney disease with heart failure and stage 1 through stage 4 chronic kidney disease, or unspecified chronic kidney disease: Secondary | ICD-10-CM | POA: Diagnosis not present

## 2023-02-21 DIAGNOSIS — R748 Abnormal levels of other serum enzymes: Secondary | ICD-10-CM | POA: Diagnosis not present

## 2023-02-21 LAB — CBC
HCT: 32.4 % — ABNORMAL LOW (ref 39.0–52.0)
Hemoglobin: 10.6 g/dL — ABNORMAL LOW (ref 13.0–17.0)
MCH: 28.5 pg (ref 26.0–34.0)
MCHC: 32.7 g/dL (ref 30.0–36.0)
MCV: 87.1 fL (ref 80.0–100.0)
Platelets: 177 10*3/uL (ref 150–400)
RBC: 3.72 MIL/uL — ABNORMAL LOW (ref 4.22–5.81)
RDW: 17.2 % — ABNORMAL HIGH (ref 11.5–15.5)
WBC: 14.9 10*3/uL — ABNORMAL HIGH (ref 4.0–10.5)
nRBC: 0 % (ref 0.0–0.2)

## 2023-02-21 LAB — I-STAT ARTERIAL BLOOD GAS, ED
Acid-base deficit: 8 mmol/L — ABNORMAL HIGH (ref 0.0–2.0)
Bicarbonate: 18.6 mmol/L — ABNORMAL LOW (ref 20.0–28.0)
Calcium, Ion: 1.2 mmol/L (ref 1.15–1.40)
HCT: 37 % — ABNORMAL LOW (ref 39.0–52.0)
Hemoglobin: 12.6 g/dL — ABNORMAL LOW (ref 13.0–17.0)
O2 Saturation: 99 %
Patient temperature: 98.4
Potassium: 4 mmol/L (ref 3.5–5.1)
Sodium: 138 mmol/L (ref 135–145)
TCO2: 20 mmol/L — ABNORMAL LOW (ref 22–32)
pCO2 arterial: 43 mmHg (ref 32–48)
pH, Arterial: 7.243 — ABNORMAL LOW (ref 7.35–7.45)
pO2, Arterial: 147 mmHg — ABNORMAL HIGH (ref 83–108)

## 2023-02-21 LAB — BASIC METABOLIC PANEL
Anion gap: 15 (ref 5–15)
BUN: 18 mg/dL (ref 8–23)
CO2: 20 mmol/L — ABNORMAL LOW (ref 22–32)
Calcium: 8.8 mg/dL — ABNORMAL LOW (ref 8.9–10.3)
Chloride: 100 mmol/L (ref 98–111)
Creatinine, Ser: 1.56 mg/dL — ABNORMAL HIGH (ref 0.61–1.24)
GFR, Estimated: 45 mL/min — ABNORMAL LOW (ref >60)
Glucose, Bld: 160 mg/dL — ABNORMAL HIGH (ref 70–99)
Potassium: 3.9 mmol/L (ref 3.5–5.1)
Sodium: 135 mmol/L (ref 135–145)

## 2023-02-21 LAB — CBG MONITORING, ED
Glucose-Capillary: 168 mg/dL — ABNORMAL HIGH (ref 70–99)
Glucose-Capillary: 145 mg/dL — ABNORMAL HIGH (ref 70–99)
Glucose-Capillary: 178 mg/dL — ABNORMAL HIGH (ref 70–99)
Glucose-Capillary: 157 mg/dL — ABNORMAL HIGH (ref 70–99)

## 2023-02-21 LAB — TROPONIN I (HIGH SENSITIVITY)
Troponin I (High Sensitivity): 31 ng/L — ABNORMAL HIGH (ref <18)
Troponin I (High Sensitivity): 4527 ng/L (ref <18)
Troponin I (High Sensitivity): 6445 ng/L (ref <18)

## 2023-02-21 LAB — GLUCOSE, CAPILLARY: Glucose-Capillary: 173 mg/dL — ABNORMAL HIGH (ref 70–99)

## 2023-02-21 LAB — MRSA NEXT GEN BY PCR, NASAL: MRSA by PCR Next Gen: NOT DETECTED

## 2023-02-21 LAB — HEPARIN LEVEL (UNFRACTIONATED): Heparin Unfractionated: 0.21 IU/mL — ABNORMAL LOW (ref 0.30–0.70)

## 2023-02-21 MED ORDER — LORAZEPAM 2 MG/ML IJ SOLN
1.0000 mg | Freq: Once | INTRAMUSCULAR | Status: AC
  Administered 2023-02-21: 1 mg via INTRAVENOUS
  Filled 2023-02-21: qty 1

## 2023-02-21 MED ORDER — HEPARIN BOLUS VIA INFUSION
500.0000 [IU] | Freq: Once | INTRAVENOUS | Status: AC
  Administered 2023-02-21: 500 [IU] via INTRAVENOUS
  Filled 2023-02-21: qty 500

## 2023-02-21 MED ORDER — FUROSEMIDE 10 MG/ML IJ SOLN
60.0000 mg | Freq: Once | INTRAMUSCULAR | Status: AC
  Administered 2023-02-21: 60 mg via INTRAVENOUS
  Filled 2023-02-21: qty 6

## 2023-02-21 MED ORDER — CHLORHEXIDINE GLUCONATE CLOTH 2 % EX PADS
6.0000 | MEDICATED_PAD | Freq: Every day | CUTANEOUS | Status: DC
  Administered 2023-02-21 – 2023-02-25 (×5): 6 via TOPICAL

## 2023-02-21 MED ORDER — ASPIRIN 81 MG PO CHEW: 81.0000 mg | CHEWABLE_TABLET | ORAL | Status: DC

## 2023-02-21 MED ORDER — INSULIN ASPART 100 UNIT/ML IJ SOLN
0.0000 [IU] | Freq: Three times a day (TID) | INTRAMUSCULAR | Status: DC
  Administered 2023-02-21 – 2023-02-22 (×3): 3 [IU] via SUBCUTANEOUS
  Administered 2023-02-22: 5 [IU] via SUBCUTANEOUS
  Administered 2023-02-23 – 2023-02-24 (×2): 3 [IU] via SUBCUTANEOUS
  Administered 2023-02-24: 5 [IU] via SUBCUTANEOUS
  Administered 2023-02-25 – 2023-02-26 (×4): 3 [IU] via SUBCUTANEOUS
  Administered 2023-02-26: 2 [IU] via SUBCUTANEOUS
  Administered 2023-02-26 – 2023-02-27 (×2): 5 [IU] via SUBCUTANEOUS
  Administered 2023-02-27 (×2): 3 [IU] via SUBCUTANEOUS
  Administered 2023-02-28: 2 [IU] via SUBCUTANEOUS
  Administered 2023-02-28: 3 [IU] via SUBCUTANEOUS
  Administered 2023-02-28: 5 [IU] via SUBCUTANEOUS
  Administered 2023-03-01 (×3): 3 [IU] via SUBCUTANEOUS

## 2023-02-21 MED ORDER — ASPIRIN 81 MG PO TBEC
81.0000 mg | DELAYED_RELEASE_TABLET | Freq: Every day | ORAL | Status: DC
  Administered 2023-02-21 – 2023-03-02 (×9): 81 mg via ORAL
  Filled 2023-02-21 (×11): qty 1

## 2023-02-21 MED ORDER — ORAL CARE MOUTH RINSE: 15.0000 mL | OROMUCOSAL | Status: DC | PRN

## 2023-02-21 MED ORDER — COENZYME Q10 30 MG PO CAPS: 200.0000 mg | ORAL_CAPSULE | Freq: Two times a day (BID) | ORAL | Status: DC

## 2023-02-21 MED ORDER — ALLOPURINOL 300 MG PO TABS
300.0000 mg | ORAL_TABLET | Freq: Every day | ORAL | Status: DC
  Administered 2023-02-23 – 2023-03-13 (×18): 300 mg via ORAL
  Filled 2023-02-21 (×19): qty 1

## 2023-02-21 MED ORDER — SODIUM CHLORIDE 0.9 % WEIGHT BASED INFUSION
3.0000 mL/kg/h | INTRAVENOUS | Status: AC
  Administered 2023-02-21: 3 mL/kg/h via INTRAVENOUS

## 2023-02-21 MED ORDER — HALOPERIDOL LACTATE 5 MG/ML IJ SOLN: 2.0000 mg | Freq: Once | INTRAMUSCULAR | Status: AC

## 2023-02-21 MED ORDER — SODIUM CHLORIDE 0.9 % WEIGHT BASED INFUSION
1.0000 mL/kg/h | INTRAVENOUS | Status: DC
  Administered 2023-02-21: 1 mL/kg/h via INTRAVENOUS

## 2023-02-21 MED ORDER — ATORVASTATIN CALCIUM 10 MG PO TABS
20.0000 mg | ORAL_TABLET | Freq: Every day | ORAL | Status: DC
  Administered 2023-02-21 – 2023-02-23 (×2): 20 mg via ORAL
  Filled 2023-02-21 (×2): qty 2

## 2023-02-21 MED ORDER — ACETAMINOPHEN 325 MG PO TABS
650.0000 mg | ORAL_TABLET | ORAL | Status: DC | PRN
  Administered 2023-03-02: 650 mg via ORAL
  Filled 2023-02-21: qty 2

## 2023-02-21 MED ORDER — ONDANSETRON HCL 4 MG/2ML IJ SOLN: 4.0000 mg | Freq: Four times a day (QID) | INTRAMUSCULAR | Status: DC | PRN

## 2023-02-21 MED ORDER — OXYCODONE-ACETAMINOPHEN 5-325 MG PO TABS
1.0000 | ORAL_TABLET | Freq: Every day | ORAL | Status: DC | PRN
  Administered 2023-03-03 – 2023-03-04 (×2): 1 via ORAL
  Filled 2023-02-21 (×2): qty 1

## 2023-02-21 MED ORDER — HEPARIN BOLUS VIA INFUSION
4000.0000 [IU] | Freq: Once | INTRAVENOUS | Status: AC
  Administered 2023-02-21: 4000 [IU] via INTRAVENOUS
  Filled 2023-02-21: qty 4000

## 2023-02-21 MED ORDER — HEPARIN (PORCINE) 25000 UT/250ML-% IV SOLN
1000.0000 [IU]/h | INTRAVENOUS | Status: DC
  Administered 2023-02-21: 1000 [IU]/h via INTRAVENOUS
  Administered 2023-02-21: 850 [IU]/h via INTRAVENOUS
  Administered 2023-02-23 (×2): 1250 [IU]/h via INTRAVENOUS
  Administered 2023-02-24: 1500 [IU]/h via INTRAVENOUS
  Administered 2023-02-25: 1600 [IU]/h via INTRAVENOUS
  Administered 2023-02-26: 1500 [IU]/h via INTRAVENOUS
  Administered 2023-02-26: 1600 [IU]/h via INTRAVENOUS
  Administered 2023-02-27 – 2023-03-01 (×3): 1350 [IU]/h via INTRAVENOUS
  Administered 2023-03-02: 1000 [IU]/h via INTRAVENOUS
  Filled 2023-02-21 (×12): qty 250

## 2023-02-21 MED ORDER — HALOPERIDOL LACTATE 5 MG/ML IJ SOLN
1.0000 mg | Freq: Once | INTRAMUSCULAR | Status: AC
  Administered 2023-02-21: 1 mg via INTRAVENOUS
  Filled 2023-02-21: qty 1

## 2023-02-21 MED ORDER — NITROGLYCERIN 0.4 MG SL SUBL: 0.4000 mg | SUBLINGUAL_TABLET | SUBLINGUAL | Status: DC | PRN

## 2023-02-21 MED ORDER — POTASSIUM CHLORIDE CRYS ER 20 MEQ PO TBCR
40.0000 meq | EXTENDED_RELEASE_TABLET | Freq: Once | ORAL | Status: AC
  Administered 2023-02-21: 40 meq via ORAL
  Filled 2023-02-21: qty 2

## 2023-02-21 MED ORDER — HEPARIN BOLUS VIA INFUSION
1000.0000 [IU] | Freq: Once | INTRAVENOUS | Status: DC
  Filled 2023-02-21: qty 1000

## 2023-02-21 MED ORDER — HALOPERIDOL LACTATE 5 MG/ML IJ SOLN: 1.0000 mg | Freq: Four times a day (QID) | INTRAMUSCULAR | Status: DC | PRN

## 2023-02-21 MED ORDER — ACETAMINOPHEN 500 MG PO TABS: 500.0000 mg | ORAL_TABLET | Freq: Every day | ORAL | Status: DC | PRN

## 2023-02-21 MED ORDER — AMLODIPINE BESYLATE 5 MG PO TABS: 5.0000 mg | ORAL_TABLET | Freq: Every morning | ORAL | Status: DC

## 2023-02-21 MED ORDER — MORPHINE SULFATE (PF) 2 MG/ML IV SOLN: 1.0000 mg | INTRAVENOUS | Status: DC | PRN

## 2023-02-21 MED ORDER — HALOPERIDOL LACTATE 5 MG/ML IJ SOLN
1.0000 mg | Freq: Once | INTRAMUSCULAR | Status: AC
  Administered 2023-02-21: 1 mg via INTRAVENOUS

## 2023-02-21 MED ORDER — VITAMIN C 500 MG PO TABS
1000.0000 mg | ORAL_TABLET | Freq: Every day | ORAL | Status: DC
  Administered 2023-02-21 – 2023-03-13 (×19): 1000 mg via ORAL
  Filled 2023-02-21 (×20): qty 2

## 2023-02-21 MED ORDER — HALOPERIDOL LACTATE 5 MG/ML IJ SOLN
INTRAMUSCULAR | Status: AC
  Administered 2023-02-21: 2 mg via INTRAVENOUS
  Filled 2023-02-21: qty 1

## 2023-02-21 MED ORDER — METOPROLOL TARTRATE 25 MG PO TABS: 50.0000 mg | ORAL_TABLET | Freq: Two times a day (BID) | ORAL | Status: DC

## 2023-02-21 MED ORDER — ORAL CARE MOUTH RINSE
15.0000 mL | OROMUCOSAL | Status: DC
  Administered 2023-02-23 – 2023-02-25 (×6): 15 mL via OROMUCOSAL

## 2023-02-21 NOTE — Progress Notes (Addendum)
Rounding Note    Patient Name: Nathan [name] Date of Encounter: 02/21/2023  [facility] Cardiologist: Donato Schultz, MD   Subjective   Chest pain had improved with IV nitroglycerin, but had issues with painful condom cath removal this morning and chest pain returned. Appears comfortable in bed currently.   Inpatient Medications    Scheduled Meds:  Continuous Infusions:  heparin 850 Units/hr (02/21/23 0037)   lactated ringers 125 mL/hr at 02/21/23 0758   nitroGLYCERIN 30 mcg/min (02/21/23 0352)   PRN Meds: morphine injection   Vital Signs    Vitals:   02/21/23 0715 02/21/23 0730 02/21/23 0745 02/21/23 0757  BP: (!) 91/54 (!) 94/55 95/61   Pulse: (!) 115 (!) 114 (!) 116   Resp: (!) 25 (!) 22 18   Temp:    98.4 F (36.9 C)  TempSrc:    Oral  SpO2: 95% 97% 97%   Weight:      Height:        Intake/Output Summary (Last 24 hours) at 02/21/2023 0854 Last data filed at 02/21/2023 0250 Gross per 24 hour  Intake --  Output 200 ml  Net -200 ml      02/20/2023   10:17 PM 01/26/2023    8:19 AM 01/21/2023    3:23 AM  Last 3 Weights  Weight (lbs) 150 lb 152 lb 150 lb  Weight (kg) 68.04 kg 68.947 kg 68.04 kg      Telemetry    Sinus tachycardia, PACs, atrial tach runs? - Personally Reviewed  ECG    Sinus Tachycardia, ST depression in lateral leads, ST elevation aVR - Personally Reviewed  Physical Exam   GEN: No acute distress.   Neck: No JVD Cardiac: RRR, no murmurs, rubs, or gallops.  Respiratory: Clear to auscultation bilaterally. GI: Soft, nontender, non-distended  MS: No edema; No deformity. Neuro:  Nonfocal  Psych: Normal affect   Labs    High Sensitivity Troponin:   Recent Labs  Lab 02/20/23 2120 02/20/23 2344 02/21/23 0624  TROPONINIHS 13 12 31*     Chemistry Recent Labs  Lab 02/20/23 2120  NA 137  K 3.4*  CL 103  CO2 20*  GLUCOSE 134*  BUN 16  CREATININE 1.28*  CALCIUM 8.9  GFRNONAA 58*  ANIONGAP 14    Lipids No results  for input(s): "CHOL", "TRIG", "HDL", "LABVLDL", "LDLCALC", "CHOLHDL" in the last 168 hours.  Hematology Recent Labs  Lab 02/20/23 2120  WBC 12.9*  RBC 3.69*  HGB 10.5*  HCT 32.3*  MCV 87.5  MCH 28.5  MCHC 32.5  RDW 17.1*  PLT 175   Thyroid No results for input(s): "TSH", "FREET4" in the last 168 hours.  BNPNo results for input(s): "BNP", "PROBNP" in the last 168 hours.  DDimer No results for input(s): "DDIMER" in the last 168 hours.   Radiology    DG Chest Port 1 View  Result Date: 02/20/2023 CLINICAL DATA:  Chest pain EXAM: PORTABLE CHEST 1 VIEW COMPARISON:  CT chest dated 01/21/2023 FINDINGS: Mild subpleural patchy opacities in the lungs bilaterally, favoring mild chronic interstitial lung disease when correlating with prior CT chest, although atypical infection is possible. No pleural effusion or pneumothorax. Cardiomegaly. Prosthetic valve. Thoracic aortic atherosclerosis. Postsurgical changes related to prior CABG. Median sternotomy. IMPRESSION: Mild subpleural patchy opacities, favoring mild chronic interstitial lung disease, although atypical infection is possible. Electronically Signed   By: Charline Bills M.D.   On: 02/20/2023 21:39    Cardiac Studies   Echo: pending  Patient Profile     77 y.o. male past medical history of CAD s/p 3v CABG (LIMA to LAD, SVG to OM2, SVG to PDA), aortic valve replacement, diabetes, hypertension, hyperlipidemia, gait and balance who presented with chest pain.  Assessment & Plan    Unstable angina CAD status post three-vessel CABG '10 -- Presented with severe left-sided chest pain/pressure yesterday afternoon.  Took sublingual nitroglycerin (suspect it was old) with no improvement in symptoms.  Ultimately placed on IV heparin and nitroglycerin drip in the ED with resolution of chest pain. -- High-sensitivity troponin 13>>12>>31 -- Initial EKG without significant ST/T wave abnormalities, repeat EKG this morning shows ST depression in  lateral leads with elevation in aVR -- For cardiac catheterization today -- On IV heparin and nitroglycerin, aspirin, statin -- Echo pending  Informed Consent   Shared Decision Making/Informed Consent The risks [stroke (1 in 1000), death (1 in 1000), kidney failure [usually temporary] (1 in 500), bleeding (1 in 200), allergic reaction [possibly serious] (1 in 200)], benefits (diagnostic support and management of coronary artery disease) and alternatives of a cardiac catheterization were discussed in detail with Mr. Lanton and he is willing to proceed.     Hypertension -- Currently on IV nitroglycerin, blood pressures are soft -- Hold home amlodipine and metoprolol  Hyperlipidemia -- Last lipid panel 07/2021 LDL 46, HDL 24 -- continue atorvastatin 20 mg daily  DM -- check Hgb A1c -- add SSI  Hypokalemia -- K+ 3.4 -- supp K   For questions or updates, please contact [facility] Please consult [email or web address] for contact info under        Signed, Laverda Page, NP  02/21/2023, 8:54 AM     Patient seen and examined. Agree with assessment and plan.  Currently, patient is pain-free on IV nitroglycerin and IV heparin therapy.  He has known CAD and is status post three-vessel CABG revascularization with a LIMA to his LAD, SVG to OM2, SVG to PDA.  He also has history of aortic valve replacement, diabetes mellitus, hypertension hyperlipidemia and gait and balance disturbance.  Absent cardiac catheterization in August 2017 revealed occluded SVG to RCA.  Patient now presents with recurrent chest pain symptomatology similar to his prior anginal complaints.  There is no JVD.  Lungs are relatively clear.  Rhythm is sinus with previous tachycardia.  1/6 systolic murmur.  Abdomen soft nontender.  No edema.  Previous catheterization in 2017 was initially attempted from the left radial but due to inability to advance the introducer wire was changed to the right femoral access.  ECG today  shows sinus tachycardia with a regular rate and more pronounced inferior and anterolateral ST-T abnormality.  I have recommended definitive cardiac catheterization. The risks and benefits of a cardiac catheterization including, but not limited to, death, stroke, MI, kidney damage and bleeding were discussed with the patient who indicates understanding and agrees to proceed.    Lennette Bihari, MD, [facility] 02/21/2023 12:20 PM
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
## 2023-02-21 NOTE — ED Notes (Signed)
Pt removed all cardiac monitor leads, bp cuff, and tore out his IV. Pt is A&Ox4 and cooperative, pt states "I dont know why, I think maybe I was dreaming."   IV access re-obtained and VS monitoring resumed.

## 2023-02-21 NOTE — Progress Notes (Addendum)
    Called to the bedside by RN with patient experiencing increased work of breathing and increased HR. Noted to be extremely dyspneic, use of accessory muscles, chest tightness. HR noted in the 160s on telemetry. He has been receiving IVFs pre -cath. Appears significantly volume overloaded on exam. Crackles bilaterally. -- given IV lasix 60mg  x1 now -- IV nitroglycerin to 40 mcg/hr -- placed on NRB, RT at the bedside to place Bipap -- stat CXR and ABG ordered -- foley -- upgrade to ICU   Attending at the bedside, agreed with plan. Family updated at the bedside. Cath canceled for today, moved until tomorrow pending respiratory status.   Janice Coffin, NP-C 02/21/2023, 3:56 PM Pager: [phone]   Patient seen and examined.  Called to ER with patient experiencing increasing work of breathing and tachycardia.  As noted above, he was extremely dyspneic, using accessory muscles and had some chest tightness with diffuse  rales/rhonchi.  Initial ECG showed probable sinus tachycardia at 160 without definitive P waves.  Subsequent ECG showed tachycardia at 140.  He had been receiving intravenous fluids for his planned cardiac catheterization today.  He was given 60 mg IV Lasix.  IV nitroglycerin was titrated to 40 mcg from 30 mcg.  Initially he was started on nasal rebreather oxygen, but due to continued difficulty wearing mask he ultimately was switched to BiPAP therapy with improved response.  Foley catheter was inserted.  BiPAP, oxygenation improved to 99% and heart rate decreased to sinus tachycardia at proximately 120.  Stat chest x-ray blood gas were ordered.  Cardiac catheterization was canceled for today.  Plan is to transfer patient to 2 heart for close observation.  Depending upon diuretic response may need additional Lasix 80 mg.  Will recheck troponin levels.  Repeat ECG in a.m. and tentatively plan right and left heart catheterization with grafts tomorrow.  Lennette Bihari, MD,  [facility] 02/21/2023 4:54 PM

## 2023-02-21 NOTE — Progress Notes (Signed)
RT transported patient from ED room 2 to 2H18 with RN. No complications, VS stable.

## 2023-02-21 NOTE — Progress Notes (Signed)
ANTICOAGULATION CONSULT NOTE - Initial Consult  Pharmacy Consult for Heparin  Indication: chest pain/ACS  Allergies  Allergen Reactions   Shellfish-Derived Products Anaphylaxis   Zolpidem Tartrate Other (See Comments)    Hallucinations    Patient Measurements: Height: 5\' 7"  (170.2 cm) Weight: 68 kg (150 lb) IBW/kg (Calculated) : 66.1  Vital Signs: Temp: 97.9 F (36.6 C) (08/12 2133) Temp Source: Oral (08/12 2133) BP: 133/66 (08/12 2245) Pulse Rate: 63 (08/12 2245)  Labs: Recent Labs    02/20/23 2120 02/20/23 2344  HGB 10.5*  --   HCT 32.3*  --   PLT 175  --   CREATININE 1.28*  --   TROPONINIHS 13 12    Estimated Creatinine Clearance: 45.2 mL/min (A) (by C-G formula based on SCr of 1.28 mg/dL (H)).   Medical History: Past Medical History:  Diagnosis Date   Allergy    Aortic stenosis    Arthritis    Balance problem 04/26/2022   Blood transfusion without reported diagnosis    CAD (coronary artery disease)    Cataract    both eyes surgically removed   Diabetes mellitus, type 2 (HCC)    Diverticulosis of colon (without mention of hemorrhage)    Duodenal ulcer perforation (HCC)    blood transfusion   Flesh-eating bacteria (HCC) 1995   GERD (gastroesophageal reflux disease)    Glaucoma    pre glaucoma on Xalatan drops   Heart murmur    History of diabetic retinopathy    History of gastrointestinal bleeding    History of necrotizing fasciitis    History of prosthetic aortic valve    Hypercholesteremia    Myocardial infarction (HCC) 02/23/2016   Neuropathic pain 04/26/2022   Nightmares    CHRONIC   OSA (obstructive sleep apnea)    borderline no CPAP    Personal history of colonic polyps 11/23/2009   TUBULAR ADENOMA   Postoperative anemia    Sinus bradycardia    Sleep apnea    no cpap   Stomach ulcer    Hx of   Systolic hypertension    Vitamin B12 deficiency     Assessment: 77 y/o M with chest pain at rest beginning 8/11, unrelieved by SL NTG,  some help with morphine. Starting heparin. PTA meds reviewed using available resources. Above labs reviewed.   Goal of Therapy:  Heparin level 0.3-0.7 units/ml Monitor platelets by anticoagulation protocol: Yes   Plan:  Heparin 4000 units BOLUS Start heparin drip at 850 units/hr Heparin level in 8 hours Daily CBC/Heparin level Monitor for bleeding  Abran Duke, PharmD, BCPS Clinical Pharmacist Phone: [phone]

## 2023-02-21 NOTE — ED Notes (Signed)
Rn was called to the room by his wife, he was having having work of breathing, increase heart rate. Rn assess patient lungs he has bilateral wheezing and coarseness in his lungs. Patient notified cardiology and the cardiology nurse practitioner came and assess patient at bedside. Patient was diaphoretic blood sugar was taken at 1607 and it was 157.

## 2023-02-21 NOTE — Progress Notes (Signed)
ANTICOAGULATION CONSULT NOTE - Follow-up Consult  Pharmacy Consult for Heparin  Indication: chest pain/ACS  Allergies  Allergen Reactions   Shellfish-Derived Products Anaphylaxis   Zolpidem Tartrate Other (See Comments)    Hallucinations    Patient Measurements: Height: 5\' 7"  (170.2 cm) Weight: 68 kg (150 lb) IBW/kg (Calculated) : 66.1 Heparin DW = 68 kg  Vital Signs: Temp: 98.4 F (36.9 C) (08/13 0757) Temp Source: Oral (08/13 0757) BP: 95/61 (08/13 0745) Pulse Rate: 116 (08/13 0745)  Labs: Recent Labs    02/20/23 2120 02/20/23 2344 02/21/23 0624 02/21/23 0925  HGB 10.5*  --   --  10.6*  HCT 32.3*  --   --  32.4*  PLT 175  --   --  177  HEPARINUNFRC  --   --   --  0.21*  CREATININE 1.28*  --   --  1.56*  TROPONINIHS 13 12 31*  --     Estimated Creatinine Clearance: 37.1 mL/min (A) (by C-G formula based on SCr of 1.56 mg/dL (H)).   Medical History: Past Medical History:  Diagnosis Date   Allergy    Aortic stenosis    Arthritis    Balance problem 04/26/2022   Blood transfusion without reported diagnosis    CAD (coronary artery disease)    Cataract    both eyes surgically removed   Diabetes mellitus, type 2 (HCC)    Diverticulosis of colon (without mention of hemorrhage)    Duodenal ulcer perforation (HCC)    blood transfusion   Flesh-eating bacteria (HCC) 1995   GERD (gastroesophageal reflux disease)    Glaucoma    pre glaucoma on Xalatan drops   Heart murmur    History of diabetic retinopathy    History of gastrointestinal bleeding    History of necrotizing fasciitis    History of prosthetic aortic valve    Hypercholesteremia    Myocardial infarction (HCC) 02/23/2016   Neuropathic pain 04/26/2022   Nightmares    CHRONIC   OSA (obstructive sleep apnea)    borderline no CPAP    Personal history of colonic polyps 11/23/2009   TUBULAR ADENOMA   Postoperative anemia    Sinus bradycardia    Sleep apnea    no cpap   Stomach ulcer    Hx of    Systolic hypertension    Vitamin B12 deficiency     Assessment: 77 y/o M with chest pain at rest beginning 8/11, unrelieved by SL NTG, some help with morphine.  Plan for LHC today, 8/13.  Pharmacy consulted for heparin dosing.  No anticoagulation PTA.  Heparin level 0.21 subtherapeutic on 850 units/hr.  No s/sx bleeding or infusion issues.  Hgb 10s stable, pltc wnl.  Goal of Therapy:  Heparin level 0.3-0.7 units/ml Monitor platelets by anticoagulation protocol: Yes   Plan:  Give small 500 unit bolus in anticipation of LHC today (add-on, not scheduled yet) Increase heparin drip to 1000 units/hr F/u after cath Daily CBC/Heparin level if remains on heparin Monitor for bleeding  Trixie Rude, PharmD Clinical Pharmacist 02/21/2023  12:30 PM  Please check AMION for all [facility] phone numbers After 10:00 PM, call Main Pharmacy [phone]

## 2023-02-21 NOTE — Progress Notes (Addendum)
1840: RN at bedside due to pt. disorientation, removing medical equipment, and trying to exit the bed. Pt requests the need to have a bowel movement. Rn asks pt. To hold off unit help comes to turn and place bedpan. 1844: Another RN at bedside to assist with position and bedpan placement. Pt. Attempting to exit bed despite redirection. Pt. Pulling self out of bed and RN had to assist pt. Back into bed. Pt. Swings at [employer] without contact. RN presses distress button to alert floor staff help is needed. 1845: Floor staff responds to alarm and staff at bedside to assist with combative pt. Safety mitts applied at this time to prevent pt. From pulling foley, PIV, and monitors off. 1907: Dr. Aggie Cosier paged and responded to RN via phone call. MD notified that this is not a pt. Of his and to contact nightshift APP. 1925: Nightshift APP paged to update on status of pt. No response. 1930: Sitter at bedside.

## 2023-02-21 NOTE — ED Notes (Signed)
Lab called a critical Troponin 31.Yvonna Alanis Physician Assistant for cardiology was called and RN received a callback at [phone] concerning lab.

## 2023-02-21 NOTE — H&P (Signed)
Cardiology Admission History and Physical   Patient ID: Nathan [name] MRN: [id number]; DOB: [birth year]   Admission date: 02/20/2023  PCP:  Garlan Fillers, MD   [facility] Providers Cardiologist:  Donato Schultz, MD        Chief Complaint:  chest pain  Patient Profile:   Nathan [name] is a 77 y.o. male with coronary artery disease status post CABG, aortic valve replacement in 2010, diabetes, hypertension, hyperlipidemia, gait imbalance who is being seen 02/21/2023 for the evaluation of chest pain.  History of Present Illness:   Nathan [name] is a 77 y.o. male with coronary artery disease status post CABG, aortic valve replacement in 2010, diabetes, hypertension, hyperlipidemia, gait imbalance who is being seen 02/21/2023 for the evaluation of chest pain.  On 03/01/2016 underwent cardiac catheterization which demonstrated SVG to RCA was the only occluded graft.  Medical management.  Nuclear stress test preceding the catheterization showed an anterior lateral defect.   Patient comes in with sudden onset of severe left chest pain/pressure not radiating anywhere, constant in nature no aggravating or relieving factors, took some nitroglycerin which was 20-year-old with not improvement and in the ER he had subsequent nitroglycerin with some improvement but not resolution, subsequently started on a nitroglycerin drip and heparin drip. Patient reports no recent URTI, no recent shortness of breath or heart failure symptoms, baseline functional status appears to be good, compliant with medication  He does report his chest pain symptoms are similar to heart attack symptoms before.  ER he was hemodynamically stable, became very tachycardic and anxious and complains of chest.  Nitroglycerin increased to 15 mics, initial troponin x 2 negative EKG shows ST depressions in anterolateral leads  Past Medical History:  Diagnosis Date   Allergy    Aortic stenosis    Arthritis    Balance  problem 04/26/2022   Blood transfusion without reported diagnosis    CAD (coronary artery disease)    Cataract    both eyes surgically removed   Diabetes mellitus, type 2 (HCC)    Diverticulosis of colon (without mention of hemorrhage)    Duodenal ulcer perforation (HCC)    blood transfusion   Flesh-eating bacteria (HCC) 1995   GERD (gastroesophageal reflux disease)    Glaucoma    pre glaucoma on Xalatan drops   Heart murmur    History of diabetic retinopathy    History of gastrointestinal bleeding    History of necrotizing fasciitis    History of prosthetic aortic valve    Hypercholesteremia    Myocardial infarction (HCC) 02/23/2016   Neuropathic pain 04/26/2022   Nightmares    CHRONIC   OSA (obstructive sleep apnea)    borderline no CPAP    Personal history of colonic polyps 11/23/2009   TUBULAR ADENOMA   Postoperative anemia    Sinus bradycardia    Sleep apnea    no cpap   Stomach ulcer    Hx of   Systolic hypertension    Vitamin B12 deficiency     Past Surgical History:  Procedure Laterality Date   AORTIC VALVE REPLACEMENT     BLEPHAROPLASTY  11-24-14   CARDIAC CATHETERIZATION N/A 03/01/2016   Procedure: Coronary/Graft Angiography;  Surgeon: Peter M Swaziland, MD;  Location: [facility] INVASIVE CV [facility];  Service: Cardiovascular;  Laterality: N/A;   CATARACT EXTRACTION W/ INTRAOCULAR LENS IMPLANT     OD   COLONOSCOPY     CORONARY ARTERY BYPASS GRAFT  2010   x 4   flesh  eating disease surgery      surgeries x 7    HERNIA REPAIR  1972   POLYPECTOMY     RETINAL LASER PROCEDURE     SCROTAL SURGERY  1995   resection   UMBILICAL HERNIA REPAIR N/A 04/10/2013   Procedure: HERNIA REPAIR UMBILICAL ADULT with mesh;  Surgeon: Valarie Merino, MD;  Location: [facility] ORS;  Service: General;  Laterality: N/A;     Medications Prior to Admission: Prior to Admission medications   Medication Sig Start Date End Date Taking? Authorizing Provider  acetaminophen (TYLENOL) 500 MG tablet Take 500  mg by mouth daily as needed for moderate pain.    [provider]  allopurinol (ZYLOPRIM) 300 MG tablet Take 300 mg by mouth daily.    [provider]  ALPRAZolam Prudy Feeler) 0.25 MG tablet Take 0.25 mg by mouth daily as needed for anxiety.    [provider]  amLODipine (NORVASC) 5 MG tablet Take 5 mg by mouth every morning.    [provider]  amoxicillin-clavulanate (AUGMENTIN) 875-125 MG tablet Take 1 tablet by mouth every 12 (twelve) hours. 01/21/23   Netta Corrigan, PA-C  Ascorbic Acid (VITAMIN C) 1000 MG tablet Take 1,000 mg by mouth daily.    [provider]  aspirin 81 MG tablet Take 1 tablet (81 mg total) by mouth daily. 02/09/16   Jake Bathe, MD  atorvastatin (LIPITOR) 20 MG tablet Take 20 mg by mouth daily. 07/25/15   [provider]  BAYER CONTOUR TEST test strip USE 1 STRIP THREE TIMES DAILY TO CHECK BLOOD SUGARS. 11/17/15   [provider]  Cholecalciferol 25 MCG (1000 UT) capsule Take 10,000 Units by mouth daily.    [provider]  co-enzyme Q-10 30 MG capsule Take 200 mg by mouth 2 (two) times daily.     [provider]  Cyanocobalamin (VITAMIN B-12 IJ) Inject 1,000 mcg into the muscle every 30 (thirty) days. Amt/dose is unknown    [provider]  cycloSPORINE (RESTASIS) 0.05 % ophthalmic emulsion Place 1 drop into both eyes 2 (two) times daily.    [provider]  fish oil-omega-3 fatty acids 1000 MG capsule Take 2 g by mouth daily.    [provider]  fluorouracil (EFUDEX) 5 % cream Apply topically daily. 06/10/22   [provider]  glipiZIDE (GLUCOTROL) 5 MG tablet Take 5 mg by mouth every morning.    [provider]  hydrochlorothiazide (MICROZIDE) 12.5 MG capsule Take 12.5 mg by mouth daily. Reported on 08/20/2015 10/13/10   Cassell Clement, MD  HYDROcodone-acetaminophen (VICODIN) 5-500 MG per tablet Take 1 tablet by mouth daily as needed for pain.      [provider]  isosorbide mononitrate (IMDUR) 30 MG 24 hr tablet TAKE 1 TABLET (30 MG TOTAL) BY MOUTH DAILY. **DO NOT CRUSH** 07/20/22   Swinyer, Zachary George, NP  ketoconazole (NIZORAL) 2 % cream 2 (two) times daily. 06/10/22   [provider]  meloxicam (MOBIC) 15 MG tablet Take 15 mg by mouth as needed for pain. 05/26/22   [provider]  metFORMIN (GLUCOPHAGE) 1000 MG tablet Take 1,000 mg by mouth daily with breakfast.    [provider]  metoprolol (LOPRESSOR) 50 MG tablet Take 50 mg by mouth 2 (two) times daily.    [provider]  minoxidil (LONITEN) 10 MG tablet Take 10 mg by mouth daily.  07/20/15   [provider]  Multiple Vitamin (MULTIVITAMIN) tablet Take 1  tablet by mouth daily.    [provider]  nitroGLYCERIN (NITROSTAT) 0.4 MG SL tablet Place 1 tablet under the tongue as needed. 09/01/16   [provider]  omeprazole (PRILOSEC) 40 MG capsule Take 40 mg by mouth 2 (two) times daily. 11/12/15   [provider]  oxyCODONE-acetaminophen (PERCOCET/ROXICET) 5-325 MG tablet Take 1 tablet by mouth daily as needed for moderate pain or severe pain.     [provider]  trolamine salicylate (ASPERCREME) 10 % cream Apply 1 application topically as needed for muscle pain.    [provider]     Allergies:    Allergies  Allergen Reactions   Shellfish-Derived Products Anaphylaxis   Zolpidem Tartrate Other (See Comments)    Hallucinations    Social History:   Social History   Socioeconomic History   Marital status: Married    Spouse name: Not on file   Number of children: 0   Years of education: Not on file   Highest education level: Not on file  Occupational History   Occupation: Investment banker, corporate: [employer]   Occupation: Retired  Tobacco Use   Smoking status: Never   Smokeless tobacco: Never  Vaping Use   Vaping status: Never Used  Substance and Sexual Activity   Alcohol  use: No    Alcohol/week: 0.0 standard drinks of alcohol   Drug use: No   Sexual activity: Not on file  Other Topics Concern   Not on file  Social History Narrative   Not on file   Social Determinants of Health   Financial Resource Strain: Not on file  Food Insecurity: Not on file  Transportation Needs: Not on file  Physical Activity: Not on file  Stress: Not on file  Social Connections: Not on file  Intimate Partner Violence: Not on file    Family History:   The patient's family history includes Alcohol abuse in his father; Cirrhosis in his father; Colon cancer (age of onset: 60) in his mother; Diabetes in his mother and sister; Hypertension in his mother and sister; Rectal cancer in his mother. There is no history of Heart attack, Stroke, Colon polyps, Esophageal cancer, or Stomach cancer.    ROS:  Please see the history of present illness.  All other ROS reviewed and negative.     Physical Exam/Data:   Vitals:   02/20/23 2215 02/20/23 2217 02/20/23 2230 02/20/23 2245  BP: 121/60  126/61 133/66  Pulse: 65  78 63  Resp: 18  11 19   Temp:      TempSrc:      SpO2: 100%  100% 100%  Weight:  68 kg    Height:  5\' 7"  (1.702 m)     No intake or output data in the 24 hours ending 02/21/23 0032    02/20/2023   10:17 PM 01/26/2023    8:19 AM 01/21/2023    3:23 AM  Last 3 Weights  Weight (lbs) 150 lb 152 lb 150 lb  Weight (kg) 68.04 kg 68.947 kg 68.04 kg     Body mass index is 23.49 kg/m.  General:  Well nourished, well developed, in no acute distress HEENT: normal Neck: no JVD Vascular: No carotid bruits; Distal pulses 2+ bilaterally   Cardiac:  normal S1, S2; RRR; no murmur  Lungs:  clear to auscultation bilaterally, no wheezing, rhonchi or rales  Abd: soft, nontender, no hepatomegaly  Ext: no edema Musculoskeletal:  No deformities, BUE and BLE strength normal  and equal Skin: warm and dry  Neuro:  CNs 2-12 intact, no focal abnormalities noted Psych:  Normal affect      Laboratory Data:  High Sensitivity Troponin:   Recent Labs  Lab 02/20/23 2120 02/20/23 2344  TROPONINIHS 13 12      Chemistry Recent Labs  Lab 02/20/23 2120  NA 137  K 3.4*  CL 103  CO2 20*  GLUCOSE 134*  BUN 16  CREATININE 1.28*  CALCIUM 8.9  GFRNONAA 58*  ANIONGAP 14    No results for input(s): "PROT", "ALBUMIN", "AST", "ALT", "ALKPHOS", "BILITOT" in the last 168 hours. Lipids No results for input(s): "CHOL", "TRIG", "HDL", "LABVLDL", "LDLCALC", "CHOLHDL" in the last 168 hours. Hematology Recent Labs  Lab 02/20/23 2120  WBC 12.9*  RBC 3.69*  HGB 10.5*  HCT 32.3*  MCV 87.5  MCH 28.5  MCHC 32.5  RDW 17.1*  PLT 175   Thyroid No results for input(s): "TSH", "FREET4" in the last 168 hours. BNPNo results for input(s): "BNP", "PROBNP" in the last 168 hours.  DDimer No results for input(s): "DDIMER" in the last 168 hours.   Radiology/Studies:  DG Chest Port 1 View  Result Date: 02/20/2023 CLINICAL DATA:  Chest pain EXAM: PORTABLE CHEST 1 VIEW COMPARISON:  CT chest dated 01/21/2023 FINDINGS: Mild subpleural patchy opacities in the lungs bilaterally, favoring mild chronic interstitial lung disease when correlating with prior CT chest, although atypical infection is possible. No pleural effusion or pneumothorax. Cardiomegaly. Prosthetic valve. Thoracic aortic atherosclerosis. Postsurgical changes related to prior CABG. Median sternotomy. IMPRESSION: Mild subpleural patchy opacities, favoring mild chronic interstitial lung disease, although atypical infection is possible. Electronically Signed   By: Charline Bills M.D.   On: 02/20/2023 21:39     CATH: 2017 Mid RCA lesion, 50 %stenosed. Mid RCA to Dist RCA lesion, 100 %stenosed. Prox Cx to Mid Cx lesion, 99 %stenosed. Prox LAD to Mid LAD lesion, 100 %stenosed. SVG to RCA is occluded Origin lesion, 100 %stenosed. SVG to OM2 is widely patent. LIMA and is normal in caliber and anatomically normal.   1. Severe  3 vessel occlusive CAD 2. Patent LIMA to the LAD 3. Patent SVG to large OM2 4. Occluded SVG to PDA. Distal RCA is occluded and fills by left to right collaterals.    Plan: recommend medical management.   ECHO: 2022 IMPRESSIONS     1. Left ventricular ejection fraction, by estimation, is 60 to 65%. The  left ventricle has normal function. The left ventricle has no regional  wall motion abnormalities. There is mild concentric left ventricular  hypertrophy. Left ventricular diastolic  parameters are consistent with Grade II diastolic dysfunction  (pseudonormalization). Elevated left ventricular end-diastolic pressure.   2. Right ventricular systolic function is normal. The right ventricular  size is normal. Tricuspid regurgitation signal is inadequate for assessing  PA pressure.   3. Left atrial size was moderately dilated.   4. The mitral valve is normal in structure. Trivial mitral valve  regurgitation. No evidence of mitral stenosis. Moderate mitral annular  calcification.   5. The aortic valve has been repaired/replaced. Aortic valve  regurgitation is not visualized. No aortic stenosis is present. Aortic  valve mean gradient measures 12.0 mmHg. Aortic valve Vmax measures 2.52  m/s.   6. Aortic dilatation noted. There is mild dilatation of the ascending  aorta, measuring 37 mm. There is mild dilatation of the aortic root,  measuring 38 mm.   7. The inferior vena cava is normal in size  with greater than 50%  respiratory variability, suggesting right atrial pressure of 3 mmHg.   CABG op report 10/20/08: Coronary artery bypass grafting x3 with the left  internal mammary to the left anterior descending coronary artery, reverse saphenous vein graft to circumflex coronary artery, reverse saphenous vein graft to the posterior descending coronary artery and aortic valve replacement with a pericardial tissue valve.  [employer], [name], 23-mm, [name], with  bilateral thigh endovein harvesting.  Assessment and Plan:   Chest pain CAD s/p CABG 2010 (LIMA-LAD, SVG-OOM2, SVG- PDA), 2017 cath showed patent lima, svg-om2, but occluded svg- PDA, severe native 3v dx, medical mx S/p AVR 2010 H/o GI bleed, DM-2, OSA and other medical problems   - admit to Cardiology - aspirin, statin, metoprolol and resume home meds. - Already initiated on NTG drip and heparin drip. Currently chest pain is better, EKG shows anterior/anterolateral ST depressions, trop x2 negative  - ECHO in am  - NPO for LHC/Graft angiography in am.   Risk Assessment/Risk Scores:    TIMI Risk Score for Unstable Angina or Non-ST Elevation MI:   The patient's TIMI risk score is 6, which indicates a 41% risk of all cause mortality, new or recurrent myocardial infarction or need for urgent revascularization in the next 14 days.      Code Status: Full Code  Severity of Illness: The appropriate patient status for this patient is INPATIENT. Inpatient status is judged to be reasonable and necessary in order to provide the required intensity of service to ensure the patient's safety. The patient's presenting symptoms, physical exam findings, and initial radiographic and laboratory data in the context of their chronic comorbidities is felt to place them at high risk for further clinical deterioration. Furthermore, it is not anticipated that the patient will be medically stable for discharge from the hospital within 2 midnights of admission.   * I certify that at the point of admission it is my clinical judgment that the patient will require inpatient hospital care spanning beyond 2 midnights from the point of admission due to high intensity of service, high risk for further deterioration and high frequency of surveillance required.*   For questions or updates, please contact [facility] Please consult [email or web address] for contact info under     Signed, Elmon Kirschner, MD   02/21/2023 12:32 AM

## 2023-02-22 ENCOUNTER — Inpatient Hospital Stay (HOSPITAL_COMMUNITY): Payer: Medicare Other

## 2023-02-22 ENCOUNTER — Encounter (HOSPITAL_COMMUNITY)
Admission: EM | Disposition: A | Discharge status: Skilled Nursing Facility | Payer: Self-pay | Source: Home / Self Care | Attending: Cardiovascular Disease

## 2023-02-22 DIAGNOSIS — I5043 Acute on chronic combined systolic (congestive) and diastolic (congestive) heart failure: Secondary | ICD-10-CM | POA: Diagnosis not present

## 2023-02-22 DIAGNOSIS — R079 Chest pain, unspecified: Secondary | ICD-10-CM | POA: Diagnosis not present

## 2023-02-22 DIAGNOSIS — I214 Non-ST elevation (NSTEMI) myocardial infarction: Secondary | ICD-10-CM

## 2023-02-22 DIAGNOSIS — Z952 Presence of prosthetic heart valve: Secondary | ICD-10-CM | POA: Diagnosis not present

## 2023-02-22 DIAGNOSIS — R0603 Acute respiratory distress: Secondary | ICD-10-CM | POA: Diagnosis not present

## 2023-02-22 DIAGNOSIS — R7989 Other specified abnormal findings of blood chemistry: Secondary | ICD-10-CM

## 2023-02-22 DIAGNOSIS — Z951 Presence of aortocoronary bypass graft: Secondary | ICD-10-CM

## 2023-02-22 DIAGNOSIS — N179 Acute kidney failure, unspecified: Secondary | ICD-10-CM

## 2023-02-22 LAB — HEPARIN LEVEL (UNFRACTIONATED)
Heparin Unfractionated: 0.14 [IU]/mL — ABNORMAL LOW (ref 0.30–0.70)
Heparin Unfractionated: 0.17 [IU]/mL — ABNORMAL LOW (ref 0.30–0.70)
Heparin Unfractionated: 0.31 [IU]/mL (ref 0.30–0.70)

## 2023-02-22 LAB — GLUCOSE, CAPILLARY
Glucose-Capillary: 228 mg/dL — ABNORMAL HIGH (ref 70–99)
Glucose-Capillary: 116 mg/dL — ABNORMAL HIGH (ref 70–99)
Glucose-Capillary: 173 mg/dL — ABNORMAL HIGH (ref 70–99)
Glucose-Capillary: 185 mg/dL — ABNORMAL HIGH (ref 70–99)

## 2023-02-22 LAB — MAGNESIUM: Magnesium: 1.2 mg/dL — ABNORMAL LOW (ref 1.7–2.4)

## 2023-02-22 SURGERY — LEFT HEART CATH AND CORS/GRAFTS ANGIOGRAPHY
Anesthesia: LOCAL

## 2023-02-22 MED ORDER — FUROSEMIDE 10 MG/ML IJ SOLN: 80.0000 mg | Freq: Two times a day (BID) | INTRAMUSCULAR | Status: DC

## 2023-02-22 MED ORDER — MAGNESIUM SULFATE 4 GM/100ML IV SOLN
4.0000 g | Freq: Once | INTRAVENOUS | Status: AC
  Administered 2023-02-22: 4 g via INTRAVENOUS
  Filled 2023-02-22: qty 100

## 2023-02-22 MED ORDER — POTASSIUM CHLORIDE 10 MEQ/100ML IV SOLN
10.0000 meq | INTRAVENOUS | Status: AC
  Administered 2023-02-22 (×4): 10 meq via INTRAVENOUS
  Filled 2023-02-22 (×4): qty 100

## 2023-02-22 MED ORDER — FUROSEMIDE 10 MG/ML IJ SOLN
60.0000 mg | Freq: Two times a day (BID) | INTRAMUSCULAR | Status: DC
  Administered 2023-02-22: 60 mg via INTRAVENOUS
  Filled 2023-02-22: qty 6

## 2023-02-22 MED ORDER — POTASSIUM CHLORIDE CRYS ER 20 MEQ PO TBCR: 40.0000 meq | EXTENDED_RELEASE_TABLET | Freq: Once | ORAL | Status: DC

## 2023-02-22 MED ORDER — FUROSEMIDE 10 MG/ML IJ SOLN
80.0000 mg | Freq: Once | INTRAMUSCULAR | Status: AC
  Administered 2023-02-22: 80 mg via INTRAVENOUS
  Filled 2023-02-22: qty 8

## 2023-02-22 MED ORDER — FUROSEMIDE 10 MG/ML IJ SOLN: 60.0000 mg | Freq: Two times a day (BID) | INTRAMUSCULAR | Status: DC

## 2023-02-22 MED ORDER — DEXMEDETOMIDINE HCL IN NACL 400 MCG/100ML IV SOLN
0.0000 ug/kg/h | INTRAVENOUS | Status: DC
  Administered 2023-02-22: 0.7 ug/kg/h via INTRAVENOUS
  Administered 2023-02-22: 0.4 ug/kg/h via INTRAVENOUS
  Administered 2023-02-23: 0.2 ug/kg/h via INTRAVENOUS
  Filled 2023-02-22 (×3): qty 100
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
## 2023-02-22 NOTE — TOC Initial Note (Signed)
Transition of Care [facility]) - Initial/Assessment Note    Patient Details  Name: Jefferson Hebeler MRN: [id number] Date of Birth: 12-07-1945  [facility]) CM/SW Contact:    Gala Lewandowsky, RN Phone Number: 02/22/2023, 2:59 PM  Clinical Narrative:  Patient was discussed in morning progression rounds. Patient presented for chest pain-cath cancelled. Patient on Precedex gtt. PTA patient was from home with spouse. Case Manager will continue to follow for transition of care needs as the patient progresses.                  Expected Discharge Plan: Home w Home Health Services Barriers to Discharge: Continued Medical Work up  Expected Discharge Plan and Services   Discharge Planning Services: CM Consult   Living arrangements for the past 2 months: Single Family Home  Prior Living Arrangements/Services Living arrangements for the past 2 months: Single Family Home Lives with:: Spouse Emotional Assessment   Attitude/Demeanor/Rapport: Unable to Assess Affect (typically observed): Unable to Assess   Alcohol / Substance Use: Not Applicable Psych Involvement: No (comment)  Admission diagnosis:  Chest pain [R07.9] Patient Active Problem List   Diagnosis Date Noted   Chest pain 02/21/2023   S/P AVR 02/21/2023   Acute on chronic combined systolic and diastolic CHF (congestive heart failure) (HCC) 02/21/2023   Respiratory distress 02/21/2023   Abdominal pain 01/21/2023   Tachycardia 01/21/2023   Elevated LFTs 01/21/2023   Balance problem 04/26/2022   Neuropathic pain 04/26/2022   Primary open-angle glaucoma, bilateral, mild stage 08/16/2021   Pseudophakia, both eyes 08/16/2021   Moderate nonproliferative diabetic retinopathy of both eyes without macular edema associated with type 2 diabetes mellitus (HCC) 08/16/2021   Posterior vitreous detachment of both eyes 08/16/2021   DM (diabetes mellitus), secondary, with neurologic complications (HCC) 06/13/2017   Obesity  (BMI 30-39.9) 03/15/2016   Abnormal nuclear stress test 03/01/2016    umbilical hernia repair open with Ventralex mesh Oct 2014 04/10/2013   Other general symptoms  01/06/2011   Personal history of colonic polyps 01/06/2011   Iron deficiency anemia 01/06/2011   Gastritis, chronic 01/06/2011   Benign hypertensive heart disease without heart failure 10/13/2010   Hx of CABG 10/13/2010   Systolic hypertension    Sinus bradycardia    CAD (coronary artery disease)    History of prosthetic aortic valve    History of diabetic retinopathy    History of necrotizing fasciitis    Postoperative anemia    Nightmares    B12 DEFICIENCY 12/02/2009   ESOPHAGEAL REFLUX 11/19/2009   CHEST PAIN 11/19/2009   BLOOD IN STOOL, OCCULT 11/19/2009   ANEMIA-IRON DEFICIENCY 08/19/2008   S/P aortic valve replacement with bioprosthetic valve 08/19/2008   GI BLEED 08/19/2008   Type 2 diabetes mellitus with hyperlipidemia (HCC) 08/14/2008   Dyslipidemia 08/14/2008   ANXIETY 08/14/2008   OBSTRUCTIVE SLEEP APNEA 08/14/2008   GLAUCOMA 08/14/2008   Essential hypertension 08/14/2008   PHARYNGITIS 08/14/2008   GASTRIC ULCER 08/14/2008   GASTRITIS 08/14/2008   DIVERTICULOSIS, COLON 08/14/2008   PCP:  Garlan Fillers, MD Pharmacy:   [facility] [phone] [facility] - [place], [place] - [address] [place] [address] Phone: [phone] Fax: [phone]  Social Determinants of Health (SDOH) Social History: SDOH Screenings   Tobacco Use: Low Risk  (02/15/2023)  Readmission Risk Interventions    02/22/2023    2:58 PM  Readmission Risk Prevention Plan  Transportation Screening Complete  HRI or Home Care Consult Complete  Social  Work Consult for Recovery Care Planning/Counseling Complete  Palliative Care Screening Not Applicable  Medication Review Oceanographer) Referral to Pharmacy

## 2023-02-22 NOTE — Progress Notes (Addendum)
ANTICOAGULATION CONSULT NOTE - Follow-up Consult  Pharmacy Consult for Heparin  Indication: chest pain/ACS  Allergies  Allergen Reactions   Shellfish-Derived Products Anaphylaxis   Zolpidem Tartrate Other (See Comments)    Hallucinations    Patient Measurements: Height: 5\' 7"  (170.2 cm) Weight: 65.7 kg (144 lb 13.5 oz) IBW/kg (Calculated) : 66.1 Heparin DW = 68 kg  Vital Signs: Temp: 98 F (36.7 C) (08/14 1151) Temp Source: Axillary (08/14 1151) BP: 124/76 (08/14 0800) Pulse Rate: 122 (08/14 0800)  Labs: Recent Labs    02/20/23 2120 02/20/23 2344 02/21/23 0624 02/21/23 0925 02/21/23 1618 02/21/23 1815 02/21/23 2110 02/22/23 0008 02/22/23 1055 02/22/23 1313  HGB 10.5*  --   --  10.6* 12.6*  --   --  10.8*  --   --   HCT 32.3*  --   --  32.4* 37.0*  --   --  32.5*  --   --   PLT 175  --   --  177  --   --   --  178  --   --   HEPARINUNFRC  --   --   --  0.21*  --   --   --   --  0.14* 0.17*  CREATININE 1.28*  --   --  1.56*  --   --   --  1.86*  --   --   TROPONINIHS 13   < > 31*  --   --  4,527* 6,445*  --   --   --    < > = values in this interval not displayed.    Estimated Creatinine Clearance: 30.9 mL/min (A) (by C-G formula based on SCr of 1.86 mg/dL (H)).   Medical History: Past Medical History:  Diagnosis Date   Allergy    Aortic stenosis    Arthritis    Balance problem 04/26/2022   Blood transfusion without reported diagnosis    CAD (coronary artery disease)    Cataract    both eyes surgically removed   Diabetes mellitus, type 2 (HCC)    Diverticulosis of colon (without mention of hemorrhage)    Duodenal ulcer perforation (HCC)    blood transfusion   Flesh-eating bacteria (HCC) 1995   GERD (gastroesophageal reflux disease)    Glaucoma    pre glaucoma on Xalatan drops   Heart murmur    History of diabetic retinopathy    History of gastrointestinal bleeding    History of necrotizing fasciitis    History of prosthetic aortic valve     Hypercholesteremia    Myocardial infarction (HCC) 02/23/2016   Neuropathic pain 04/26/2022   Nightmares    CHRONIC   OSA (obstructive sleep apnea)    borderline no CPAP    Personal history of colonic polyps 11/23/2009   TUBULAR ADENOMA   Postoperative anemia    Sinus bradycardia    Sleep apnea    no cpap   Stomach ulcer    Hx of   Systolic hypertension    Vitamin B12 deficiency     Assessment: 77 y/o M with chest pain at rest beginning 8/11, unrelieved by SL NTG, some help with morphine.  Plan for LHC today, 8/14, but patient was too agitated for procedure, so did not take place. No anticoagulation PTA.  Heparin level 0.14 was subtherapeutic on 1000 units/hr, but there were concerns that there the line had been misplaced as there were frequent obstruction alerts from the the Alaris pumps, so I repeat level was  ordered once new lines were placed. Repeat heparin level of 0.17 was subtherapeutic. No s/sx bleeding. Hgb 10s and Plt 170s stable.  Goal of Therapy:  Heparin level 0.3-0.7 units/ml Monitor platelets by anticoagulation protocol: Yes   Plan:  Increase heparin drip to 1250 units/hr Ordered HL for 2000, ~8 hours after dose adjustment. Monitor daily CBC/Heparin levels while on UFH Monitor for bleeding  Wilmer Floor, PharmD PGY2 Cardiology Pharmacy Resident  Please check AMION for all [facility] Pharmacy phone numbers After 10:00 PM, call Main Pharmacy [phone]

## 2023-02-22 NOTE — Progress Notes (Signed)
Rounding Note    Patient Name: Nathan [name] Date of Encounter: 02/22/2023  [facility] Cardiologist: Donato Schultz, MD   Subjective   Restless and restrained; no chest pain   Inpatient Medications    Scheduled Meds:  allopurinol  300 mg Oral Daily   vitamin C  1,000 mg Oral Daily   aspirin EC  81 mg Oral Daily   atorvastatin  20 mg Oral Daily   Chlorhexidine Gluconate Cloth  6 each Topical Daily   insulin aspart  0-15 Units Subcutaneous TID WC   mouth rinse  15 mL Mouth Rinse 4 times per day   potassium chloride  40 mEq Oral Once   Continuous Infusions:  sodium chloride Stopped (02/21/23 1508)   dexmedetomidine (PRECEDEX) IV infusion 0.2 mcg/kg/hr (02/22/23 0600)   heparin 1,000 Units/hr (02/22/23 0600)   nitroGLYCERIN Stopped (02/22/23 0338)   PRN Meds: acetaminophen, haloperidol lactate, nitroGLYCERIN, ondansetron (ZOFRAN) IV, mouth rinse, oxyCODONE-acetaminophen   Vital Signs    Vitals:   02/22/23 0635 02/22/23 0645 02/22/23 0650 02/22/23 0700  BP: 127/86 97/66 (!) 82/56 (!) 89/58  Pulse: (!) 142 (!) 122 (!) 121 (!) 119  Resp: (!) 29 (!) 26 (!) 26 (!) 27  Temp:   97.9 F (36.6 C)   TempSrc:   Axillary   SpO2: 96% 98% 98% 98%  Weight:      Height:        Intake/Output Summary (Last 24 hours) at 02/22/2023 0844 Last data filed at 02/22/2023 0630 Gross per 24 hour  Intake 785.12 ml  Output 1012 ml  Net -226.88 ml      02/22/2023    6:00 AM 02/20/2023   10:17 PM 01/26/2023    8:19 AM  Last 3 Weights  Weight (lbs) 144 lb 13.5 oz 150 lb 152 lb  Weight (kg) 65.7 kg 68.04 kg 68.947 kg      Telemetry    Sinus tachycardia at 134.  - Personally Reviewed  ECG    Sinus at 110 with Mobitz 1 block - Personally Reviewed  Physical Exam   BP 124/76 (BP Location: Right Arm)   Pulse (!) 122   Temp 97.9 F (36.6 C) (Axillary)   Resp (!) 27   Ht 5\' 7"  (1.702 m)   Wt 65.7 kg   SpO2 98%   BMI 22.69 kg/m  General: Alert, restless,   Skin:  normal turgor, no rashes, warm and dry HEENT: Normocephalic, atraumatic. Nose without nasal septal hypertrophy Mouth/Parynx benign; Mallinpatti scale 3/4 Neck: No JVD, no carotid bruits; normal carotid upstroke Lungs:  basilar rales Chest wall: without tenderness to palpitation Heart: PMI not displaced, tachycardiac, s1 s2 normal, soft s3, 2/6 systolic murmur, no diastolic murmur, Abdomen: soft, nontender; no hepatosplenomehaly, BS+; abdominal aorta nontender and not dilated by palpation. Back: no CVA tenderness Pulses 2+ Musculoskeletal: full range of motion, normal strength, no joint deformities Extremities: no clubbing cyanosis or edema, Homan's sign negative  Neurologic: grossly nonfocal; Cranial nerves grossly wnl    Labs    High Sensitivity Troponin:   Recent Labs  Lab 02/20/23 2120 02/20/23 2344 02/21/23 0624 02/21/23 1815 02/21/23 2110  TROPONINIHS 13 12 31* 4,527* 6,445*     Chemistry Recent Labs  Lab 02/20/23 2120 02/21/23 0925 02/21/23 1618 02/22/23 0008  NA 137 135 138 136  K 3.4* 3.9 4.0 3.8  CL 103 100  --  100  CO2 20* 20*  --  17*  GLUCOSE 134* 160*  --  182*  BUN 16 18  --  27*  CREATININE 1.28* 1.56*  --  1.86*  CALCIUM 8.9 8.8*  --  8.7*  PROT  --   --   --  6.1*  ALBUMIN  --   --   --  3.2*  AST  --   --   --  106*  ALT  --   --   --  69*  ALKPHOS  --   --   --  82  BILITOT  --   --   --  2.4*  GFRNONAA 58* 45*  --  37*  ANIONGAP 14 15  --  19*    Lipids No results for input(s): "CHOL", "TRIG", "HDL", "LABVLDL", "LDLCALC", "CHOLHDL" in the last 168 hours.  Hematology Recent Labs  Lab 02/20/23 2120 02/21/23 0925 02/21/23 1618 02/22/23 0008  WBC 12.9* 14.9*  --  20.7*  RBC 3.69* 3.72*  --  3.79*  HGB 10.5* 10.6* 12.6* 10.8*  HCT 32.3* 32.4* 37.0* 32.5*  MCV 87.5 87.1  --  85.8  MCH 28.5 28.5  --  28.5  MCHC 32.5 32.7  --  33.2  RDW 17.1* 17.2*  --  17.2*  PLT 175 177  --  178   Thyroid No results for input(s): "TSH", "FREET4" in  the last 168 hours.  BNPNo results for input(s): "BNP", "PROBNP" in the last 168 hours.  DDimer No results for input(s): "DDIMER" in the last 168 hours.   Radiology    ECHOCARDIOGRAM COMPLETE  Result Date: 02/22/2023    ECHOCARDIOGRAM REPORT   Patient Name:   Nathan [name] Date of Exam: 02/22/2023 Medical Rec #:  [id number]      Height:       67.0 in Accession #:    [id number]     Weight:       144.8 lb Date of Birth:  15-May-1946     BSA:          1.763 m Patient Age:    77 years       BP:           89/59 mmHg Patient Gender: M              HR:           127 bpm. Exam Location:  Inpatient Procedure: 2D Echo, Cardiac Doppler and Color Doppler Indications:    R07.9* Chest pain, unspecified  History:        Patient has prior history of Echocardiogram examinations, most                 recent 08/21/2020. CAD, Prior CABG, Aortic Valve Disease,                 Signs/Symptoms:Shortness of Breath, Chest Pain and Dyspnea; Risk                 Factors:Diabetes. AVR.                 Aortic Valve: 23 mm Edwards bioprosthetic valve is present in                 the aortic position. Procedure Date: 2010.  Sonographer:    Sheralyn Boatman RDCS Referring Phys: [id number] [facility]  Sonographer Comments: Image acquisition challenging due to patient behavioral factors. and Image acquisition challenging due to uncooperative patient. Patient rolled away from probe, in 4 piont restraints. IMPRESSIONS  1. Only limited images obtained; pt refused to continue study after initial  apical images obtained; doppler of aortic valve not complete.  2. Left ventricular ejection fraction, by estimation, is 20 to 25%. The left ventricle has severely decreased function. The left ventricle demonstrates global hypokinesis. The left ventricular internal cavity size was mildly dilated. There is mild left ventricular hypertrophy. Left ventricular diastolic function could not be evaluated.  3. Right ventricular systolic function is moderately reduced.  The right ventricular size is normal.  4. Left atrial size was moderately dilated.  5. The mitral valve is degenerative. Mild mitral valve regurgitation. No evidence of mitral stenosis. Moderate mitral annular calcification.  6. The aortic valve has been repaired/replaced. Aortic valve regurgitation is not visualized. There is a 23 mm Edwards bioprosthetic valve present in the aortic position. Procedure Date: 2010.  7. Aortic dilatation noted. There is mild dilatation of the ascending aorta, measuring 40 mm. FINDINGS  Left Ventricle: Left ventricular ejection fraction, by estimation, is 20 to 25%. The left ventricle has severely decreased function. The left ventricle demonstrates global hypokinesis. The left ventricular internal cavity size was mildly dilated. There is mild left ventricular hypertrophy. Left ventricular diastolic function could not be evaluated due to atrial fibrillation. Left ventricular diastolic function could not be evaluated. Right Ventricle: The right ventricular size is normal. Right ventricular systolic function is moderately reduced. Left Atrium: Left atrial size was moderately dilated. Right Atrium: Right atrial size was normal in size. Pericardium: There is no evidence of pericardial effusion. Mitral Valve: The mitral valve is degenerative in appearance. Moderate mitral annular calcification. Mild mitral valve regurgitation. No evidence of mitral valve stenosis. Tricuspid Valve: The tricuspid valve is normal in structure. Tricuspid valve regurgitation is mild . No evidence of tricuspid stenosis. Aortic Valve: The aortic valve has been repaired/replaced. Aortic valve regurgitation is not visualized. There is a 23 mm Edwards bioprosthetic valve present in the aortic position. Procedure Date: 2010. Pulmonic Valve: The pulmonic valve was normal in structure. Pulmonic valve regurgitation is trivial. No evidence of pulmonic stenosis. Aorta: Aortic dilatation noted. There is mild dilatation of  the ascending aorta, measuring 40 mm. IAS/Shunts: The interatrial septum was not assessed. Additional Comments: Only limited images obtained; pt refused to continue study after initial apical images obtained; doppler of aortic valve not complete.  LEFT VENTRICLE PLAX 2D LVIDd:         5.50 cm LVIDs:         5.00 cm LV PW:         1.30 cm LV IVS:        1.10 cm LVOT diam:     2.10 cm LVOT Area:     3.46 cm  LEFT ATRIUM           Index LA diam:      4.60 cm 2.61 cm/m LA Vol (A4C): 54.3 ml 30.80 ml/m   AORTA Ao Root diam: 3.60 cm Ao Asc diam:  3.90 cm  SHUNTS Systemic Diam: 2.10 cm Olga Millers MD Electronically signed by Olga Millers MD Signature Date/Time: 02/22/2023/8:44:24 AM    Final    DG CHEST PORT 1 VIEW  Result Date: 02/21/2023 CLINICAL DATA:  Shortness of breath. EXAM: PORTABLE CHEST 1 VIEW COMPARISON:  Radiograph yesterday, CT 01/21/2023 FINDINGS: Patient is rotated. Prior median sternotomy. Cardiac valve replacement. Stable cardiomegaly. Unchanged mediastinal contours. Increasing linear opacity in the right mid lung may represent fluid in the fissure. Suspect small pleural effusions. Progressive interstitial thickening which may represent pulmonary edema superimposed on chronic lung disease. No pneumothorax. IMPRESSION: 1.  Progressive interstitial thickening which may represent pulmonary edema superimposed on chronic lung disease. 2. Suspect small pleural effusions. 3. Stable cardiomegaly. Electronically Signed   By: Narda Rutherford M.D.   On: 02/21/2023 17:33   DG Chest Port 1 View  Result Date: 02/20/2023 CLINICAL DATA:  Chest pain EXAM: PORTABLE CHEST 1 VIEW COMPARISON:  CT chest dated 01/21/2023 FINDINGS: Mild subpleural patchy opacities in the lungs bilaterally, favoring mild chronic interstitial lung disease when correlating with prior CT chest, although atypical infection is possible. No pleural effusion or pneumothorax. Cardiomegaly. Prosthetic valve. Thoracic aortic atherosclerosis.  Postsurgical changes related to prior CABG. Median sternotomy. IMPRESSION: Mild subpleural patchy opacities, favoring mild chronic interstitial lung disease, although atypical infection is possible. Electronically Signed   By: Charline Bills M.D.   On: 02/20/2023 21:39    Cardiac Studies    ECHO: 02/22/2023  1. Only limited images obtained; pt refused to continue study after  initial apical images obtained; doppler of aortic valve not complete.   2. Left ventricular ejection fraction, by estimation, is 20 to 25%. The  left ventricle has severely decreased function. The left ventricle  demonstrates global hypokinesis. The left ventricular internal cavity size  was mildly dilated. There is mild left  ventricular hypertrophy. Left ventricular diastolic function could not be  evaluated.   3. Right ventricular systolic function is moderately reduced. The right  ventricular size is normal.   4. Left atrial size was moderately dilated.   5. The mitral valve is degenerative. Mild mitral valve regurgitation. No  evidence of mitral stenosis. Moderate mitral annular calcification.   6. The aortic valve has been repaired/replaced. Aortic valve  regurgitation is not visualized. There is a 23 mm Edwards bioprosthetic  valve present in the aortic position. Procedure Date: 2010.   7. Aortic dilatation noted. There is mild dilatation of the ascending  aorta, measuring 40 mm.    Patient Profile     Mr Treon is a 77 y.o. male past medical history of CAD s/p 3v CABG (LIMA to LAD, SVG to OM2, SVG to PDA), aortic valve replacement, diabetes, hypertension, hyperlipidemia, gait and balance who presented with chest pain.   Assessment & Plan    NSTEMI: Patient underwent CABG surgery x 3 in 2010 with a LIMA to LAD, SVG to OM2, and SVG to PDA.  At the same time he underwent aortic valve replacement with a bioprosthetic aortic valve.  He has been documented to have occlusion of the SVG to PDA on subsequent  catheterization in 2017.  Developed recurrent anginal symptomatology leading to present admission.  Troponins have increased to 6445 consistent with non-STEMI.  He was scheduled to undergo cardiac catheterization yesterday but developed significant volume overload/CHF leading to transient BiPAP, IV Lasix, and subsequent transfer to ICU.  He is without chest pain but continues to be tachycardic, restless, and chest x-ray suggests component of pulmonary edema superimposed on chronic lung disease. Cardiomyopathy: Will attempt a 2D echo Doppler study this morning demonstrates severe LV dysfunction with EF 20 to 25%.  The patient was very restless during the procedure and at present data was not able to be obtained with reference to aortic valve gradient.  In addition, Definity contrast was not able to be given for further assessment of potential  LV thrombus in the setting of apical akinesis. HFrEF: Ultimately will require right and left heart cardiac catheterization.  Presently this will need to be deferred.  Will reinitiate furosemide 80 mg IV now and then changed  to 60 mg IV every 12 hours.  I/O is only minimally negative at -402.  AKI: Creatinine this morning is increased at 1.86.  Albumin 3.2. Transaminase elevation with AST 106 ALT 69.  May be contributed by volume overload.  He is on atorvastatin 20 mg. Monitor LFT's.  Hold statin today. Agitation/delirium: Events noted from night.  Due to persistent agitation despite 4 mg IV Haldol and 1 mg IV Ativan he was started on Precedex drip. Anemia: H/H 10.8, 32.5. Hyperlidiemia:  will check lipid panal in AM    For questions or updates, please contact [facility] Please consult [email or web address] for contact info under    Critical care: 45 minutes    Signed, Nicki Guadalajara, MD  02/22/2023, 8:44 AM

## 2023-02-22 NOTE — Progress Notes (Signed)
  Echocardiogram 2D Echocardiogram has been performed.  Janalyn Harder 02/22/2023, 8:28 AM

## 2023-02-22 NOTE — Progress Notes (Signed)
ANTICOAGULATION CONSULT NOTE - Follow-up Consult  Pharmacy Consult for Heparin  Indication: chest pain/ACS  Allergies  Allergen Reactions   Shellfish-Derived Products Anaphylaxis   Zolpidem Tartrate Other (See Comments)    Hallucinations    Patient Measurements: Height: 5\' 7"  (170.2 cm) Weight: 65.7 kg (144 lb 13.5 oz) IBW/kg (Calculated) : 66.1 Heparin DW = 68 kg  Vital Signs: Temp: 97.7 F (36.5 C) (08/14 1631) Temp Source: Axillary (08/14 1631) BP: 136/95 (08/14 1900) Pulse Rate: 123 (08/14 1900)  Labs: Recent Labs    02/20/23 2120 02/20/23 2120 02/20/23 2344 02/21/23 0624 02/21/23 0925 02/21/23 1618 02/21/23 1815 02/21/23 2110 02/22/23 0008 02/22/23 1055 02/22/23 1313 02/22/23 2141  HGB 10.5*  --   --   --  10.6* 12.6*  --   --  10.8*  --   --   --   HCT 32.3*  --   --   --  32.4* 37.0*  --   --  32.5*  --   --   --   PLT 175  --   --   --  177  --   --   --  178  --   --   --   HEPARINUNFRC  --    < >  --   --  0.21*  --   --   --   --  0.14* 0.17* 0.31  CREATININE 1.28*  --   --   --  1.56*  --   --   --  1.86*  --   --   --   TROPONINIHS 13  --    < > 31*  --   --  4,527* 6,445*  --   --   --   --    < > = values in this interval not displayed.    Estimated Creatinine Clearance: 30.9 mL/min (A) (by C-G formula based on SCr of 1.86 mg/dL (H)).   Medical History: Past Medical History:  Diagnosis Date   Allergy    Aortic stenosis    Arthritis    Balance problem 04/26/2022   Blood transfusion without reported diagnosis    CAD (coronary artery disease)    Cataract    both eyes surgically removed   Diabetes mellitus, type 2 (HCC)    Diverticulosis of colon (without mention of hemorrhage)    Duodenal ulcer perforation (HCC)    blood transfusion   Flesh-eating bacteria (HCC) 1995   GERD (gastroesophageal reflux disease)    Glaucoma    pre glaucoma on Xalatan drops   Heart murmur    History of diabetic retinopathy    History of gastrointestinal  bleeding    History of necrotizing fasciitis    History of prosthetic aortic valve    Hypercholesteremia    Myocardial infarction (HCC) 02/23/2016   Neuropathic pain 04/26/2022   Nightmares    CHRONIC   OSA (obstructive sleep apnea)    borderline no CPAP    Personal history of colonic polyps 11/23/2009   TUBULAR ADENOMA   Postoperative anemia    Sinus bradycardia    Sleep apnea    no cpap   Stomach ulcer    Hx of   Systolic hypertension    Vitamin B12 deficiency     Assessment: 77 y/o M with chest pain at rest beginning 8/11, unrelieved by SL NTG, some help with morphine.  Plan for LHC today, 8/14, but patient was too agitated for procedure, so did not take place.  No anticoagulation PTA.  Heparin level 0.14 was subtherapeutic on 1000 units/hr, but there were concerns that there the line had been misplaced as there were frequent obstruction alerts from the the [employer] pumps, so I repeat level was ordered once new lines were placed. Repeat heparin level of 0.17 was subtherapeutic. No s/sx bleeding. Hgb 10s and Plt 170s stable.  08/14 PM: heparin level 0.31 on 1250 units/hr. No signs/symptoms of bleeding reported and Hgb 10s and Plt 170s stable.   Goal of Therapy:  Heparin level 0.3-0.7 units/ml Monitor platelets by anticoagulation protocol: Yes   Plan:  Continue heparin drip at 1250 units/hr Follow up confirmatory heparin level in 8 hours  Monitor daily CBC/Heparin levels while on UFH Monitor for bleeding  Arabella Merles, PharmD. Clinical Pharmacist 02/22/2023 10:20 PM

## 2023-02-23 DIAGNOSIS — R0603 Acute respiratory distress: Secondary | ICD-10-CM | POA: Diagnosis not present

## 2023-02-23 DIAGNOSIS — Z952 Presence of prosthetic heart valve: Secondary | ICD-10-CM | POA: Diagnosis not present

## 2023-02-23 DIAGNOSIS — I255 Ischemic cardiomyopathy: Secondary | ICD-10-CM

## 2023-02-23 DIAGNOSIS — I25118 Atherosclerotic heart disease of native coronary artery with other forms of angina pectoris: Secondary | ICD-10-CM | POA: Diagnosis not present

## 2023-02-23 DIAGNOSIS — I5043 Acute on chronic combined systolic (congestive) and diastolic (congestive) heart failure: Secondary | ICD-10-CM | POA: Diagnosis not present

## 2023-02-23 LAB — COMPREHENSIVE METABOLIC PANEL
ALT: 59 U/L — ABNORMAL HIGH (ref 0–44)
AST: 95 U/L — ABNORMAL HIGH (ref 15–41)
Albumin: 2.9 g/dL — ABNORMAL LOW (ref 3.5–5.0)
Alkaline Phosphatase: 68 U/L (ref 38–126)
Anion gap: 14 (ref 5–15)
BUN: 44 mg/dL — ABNORMAL HIGH (ref 8–23)
CO2: 20 mmol/L — ABNORMAL LOW (ref 22–32)
Calcium: 8.6 mg/dL — ABNORMAL LOW (ref 8.9–10.3)
Chloride: 103 mmol/L (ref 98–111)
Creatinine, Ser: 2.13 mg/dL — ABNORMAL HIGH (ref 0.61–1.24)
GFR, Estimated: 31 mL/min — ABNORMAL LOW (ref >60)
Glucose, Bld: 178 mg/dL — ABNORMAL HIGH (ref 70–99)
Potassium: 4.7 mmol/L (ref 3.5–5.1)
Sodium: 137 mmol/L (ref 135–145)
Total Bilirubin: 2.6 mg/dL — ABNORMAL HIGH (ref 0.3–1.2)
Total Protein: 6 g/dL — ABNORMAL LOW (ref 6.5–8.1)

## 2023-02-23 LAB — CBC
HCT: 29.8 % — ABNORMAL LOW (ref 39.0–52.0)
HCT: 29.9 % — ABNORMAL LOW (ref 39.0–52.0)
Hemoglobin: 10.2 g/dL — ABNORMAL LOW (ref 13.0–17.0)
Hemoglobin: 9.8 g/dL — ABNORMAL LOW (ref 13.0–17.0)
MCH: 28.2 pg (ref 26.0–34.0)
MCH: 28.7 pg (ref 26.0–34.0)
MCHC: 32.8 g/dL (ref 30.0–36.0)
MCHC: 34.2 g/dL (ref 30.0–36.0)
MCV: 82.3 fL (ref 80.0–100.0)
MCV: 87.7 fL (ref 80.0–100.0)
Platelets: 138 10*3/uL — ABNORMAL LOW (ref 150–400)
Platelets: 168 10*3/uL (ref 150–400)
RBC: 3.41 MIL/uL — ABNORMAL LOW (ref 4.22–5.81)
RBC: 3.62 MIL/uL — ABNORMAL LOW (ref 4.22–5.81)
RDW: 16.7 % — ABNORMAL HIGH (ref 11.5–15.5)
RDW: 17.3 % — ABNORMAL HIGH (ref 11.5–15.5)
WBC: 14 10*3/uL — ABNORMAL HIGH (ref 4.0–10.5)
WBC: 17.6 10*3/uL — ABNORMAL HIGH (ref 4.0–10.5)
nRBC: 0 % (ref 0.0–0.2)
nRBC: 0 % (ref 0.0–0.2)

## 2023-02-23 LAB — TSH: TSH: 0.953 u[IU]/mL (ref 0.350–4.500)

## 2023-02-23 LAB — GLUCOSE, CAPILLARY
Glucose-Capillary: 159 mg/dL — ABNORMAL HIGH (ref 70–99)
Glucose-Capillary: 160 mg/dL — ABNORMAL HIGH (ref 70–99)
Glucose-Capillary: 116 mg/dL — ABNORMAL HIGH (ref 70–99)
Glucose-Capillary: 112 mg/dL — ABNORMAL HIGH (ref 70–99)
Glucose-Capillary: 122 mg/dL — ABNORMAL HIGH (ref 70–99)

## 2023-02-23 LAB — LIPID PANEL
Cholesterol: 87 mg/dL (ref 0–200)
HDL: 16 mg/dL — ABNORMAL LOW (ref >40)
LDL Cholesterol: 37 mg/dL (ref 0–99)
Total CHOL/HDL Ratio: 5.4 RATIO
Triglycerides: 171 mg/dL — ABNORMAL HIGH (ref <150)
VLDL: 34 mg/dL (ref 0–40)

## 2023-02-23 LAB — HEPARIN LEVEL (UNFRACTIONATED)
Heparin Unfractionated: 0.21 [IU]/mL — ABNORMAL LOW (ref 0.30–0.70)
Heparin Unfractionated: 0.23 [IU]/mL — ABNORMAL LOW (ref 0.30–0.70)
Heparin Unfractionated: 0.72 [IU]/mL — ABNORMAL HIGH (ref 0.30–0.70)
Heparin Unfractionated: <0.1 [IU]/mL — ABNORMAL LOW (ref 0.30–0.70)
Heparin Unfractionated: <0.1 [IU]/mL — ABNORMAL LOW (ref 0.30–0.70)

## 2023-02-23 LAB — MAGNESIUM: Magnesium: 2.2 mg/dL (ref 1.7–2.4)

## 2023-02-23 LAB — APTT: aPTT: 36 seconds (ref 24–36)

## 2023-02-23 MED ORDER — ISOSORBIDE MONONITRATE ER 30 MG PO TB24
30.0000 mg | ORAL_TABLET | Freq: Every day | ORAL | Status: DC
  Administered 2023-02-23 – 2023-03-02 (×8): 30 mg via ORAL
  Filled 2023-02-23 (×8): qty 1

## 2023-02-23 MED ORDER — CARVEDILOL 3.125 MG PO TABS
3.1250 mg | ORAL_TABLET | Freq: Two times a day (BID) | ORAL | Status: DC
  Administered 2023-02-23 (×2): 3.125 mg via ORAL
  Filled 2023-02-23 (×2): qty 1

## 2023-02-23 MED ORDER — FUROSEMIDE 10 MG/ML IJ SOLN
60.0000 mg | Freq: Every day | INTRAMUSCULAR | Status: DC
  Administered 2023-02-23 – 2023-02-24 (×2): 60 mg via INTRAVENOUS
  Filled 2023-02-23 (×2): qty 6

## 2023-02-23 NOTE — Progress Notes (Signed)
Rounding Note    Patient Name: Nathan [name] Date of Encounter: 02/23/2023  [facility] Cardiologist: Donato Schultz, MD   Subjective    Feels much better; no longer agitated; no chest pain  Inpatient Medications    Scheduled Meds:  allopurinol  300 mg Oral Daily   vitamin C  1,000 mg Oral Daily   aspirin EC  81 mg Oral Daily   atorvastatin  20 mg Oral Daily   Chlorhexidine Gluconate Cloth  6 each Topical Daily   furosemide  60 mg Intravenous Q12H   insulin aspart  0-15 Units Subcutaneous TID WC   mouth rinse  15 mL Mouth Rinse 4 times per day   Continuous Infusions:  sodium chloride Stopped (02/21/23 1508)   dexmedetomidine (PRECEDEX) IV infusion 0.1 mcg/kg/hr (02/23/23 0800)   heparin 1,100 Units/hr (02/23/23 0800)   nitroGLYCERIN Stopped (02/22/23 0918)   PRN Meds: acetaminophen, haloperidol lactate, nitroGLYCERIN, ondansetron (ZOFRAN) IV, mouth rinse, oxyCODONE-acetaminophen   Vital Signs    Vitals:   02/23/23 0645 02/23/23 0700 02/23/23 0755 02/23/23 0800  BP:  (!) 131/96  133/89  Pulse: (!) 101 (!) 103  (!) 110  Resp: (!) 30 (!) 28  (!) 32  Temp:   98.2 F (36.8 C)   TempSrc:   Oral   SpO2: 99% 98%  97%  Weight:      Height:        Intake/Output Summary (Last 24 hours) at 02/23/2023 0902 Last data filed at 02/23/2023 0800 Gross per 24 hour  Intake 542.07 ml  Output 1850 ml  Net -1307.93 ml   I/O since admission -1709           02/23/2023    6:00 AM 02/22/2023    6:00 AM 02/20/2023   10:17 PM  Last 3 Weights  Weight (lbs) 151 lb 7.3 oz 144 lb 13.5 oz 150 lb  Weight (kg) 68.7 kg 65.7 kg 68.04 kg      Telemetry    Sinus tachycardia at 110.  - Personally Reviewed  ECG    Sinus at 130- Personally Reviewed  Physical Exam    BP 133/89 (BP Location: Left Arm)   Pulse (!) 110   Temp 98.2 F (36.8 C) (Oral)   Resp (!) 32   Ht 5\' 7"  (1.702 m)   Wt 68.7 kg   SpO2 97%   BMI 23.72 kg/m  General: Alert, oriented, no distress.   Skin: normal turgor, no rashes, warm and dry HEENT: Normocephalic, atraumatic.  Nose without nasal septal hypertrophy Mouth/Parynx benign; Mallinpatti scale 3/4 Neck: No JVD, no carotid bruits; normal carotid upstroke Lungs: improved aeration, decreased breath sounds at bases Chest wall: without tenderness to palpitation Heart: PMI not displaced, RRR, s1 s2 normal, 1/6 systolic murmur, no diastolic murmur, no rubs, gallops, thrills, or heaves Abdomen: soft, nontender; no hepatosplenomehaly, BS+; abdominal aorta nontender and not dilated by palpation. Back: no CVA tenderness Pulses 2+ Musculoskeletal: full range of motion, normal strength, no joint deformities Extremities: no clubbing cyanosis or edema, Homan's sign negative  Neurologic: grossly nonfocal; Cranial nerves grossly wnl Psychologic: Normal mood and affect    Labs    High Sensitivity Troponin:   Recent Labs  Lab 02/20/23 2120 02/20/23 2344 02/21/23 0624 02/21/23 1815 02/21/23 2110  TROPONINIHS 13 12 31* 4,527* 6,445*     Chemistry Recent Labs  Lab 02/21/23 0925 02/21/23 1618 02/22/23 0008 02/22/23 1055 02/23/23 0002  NA 135 138 136  --  137  K  3.9 4.0 3.8  --  4.7  CL 100  --  100  --  103  CO2 20*  --  17*  --  20*  GLUCOSE 160*  --  182*  --  178*  BUN 18  --  27*  --  44*  CREATININE 1.56*  --  1.86*  --  2.13*  CALCIUM 8.8*  --  8.7*  --  8.6*  MG  --   --   --  1.2* 2.2  PROT  --   --  6.1*  --  6.0*  ALBUMIN  --   --  3.2*  --  2.9*  AST  --   --  106*  --  95*  ALT  --   --  69*  --  59*  ALKPHOS  --   --  82  --  68  BILITOT  --   --  2.4*  --  2.6*  GFRNONAA 45*  --  37*  --  31*  ANIONGAP 15  --  19*  --  14    Lipids  Recent Labs  Lab 02/23/23 0002  CHOL 87  TRIG 171*  HDL 16*  LDLCALC 37  CHOLHDL 5.4    Hematology Recent Labs  Lab 02/21/23 0925 02/21/23 1618 02/22/23 0008 02/23/23 0002  WBC 14.9*  --  20.7* 14.0*  RBC 3.72*  --  3.79* 3.41*  HGB 10.6* 12.6* 10.8* 9.8*   HCT 32.4* 37.0* 32.5* 29.9*  MCV 87.1  --  85.8 87.7  MCH 28.5  --  28.5 28.7  MCHC 32.7  --  33.2 32.8  RDW 17.2*  --  17.2* 17.3*  PLT 177  --  178 138*   Thyroid  Recent Labs  Lab 02/23/23 0002  TSH 0.953    BNPNo results for input(s): "BNP", "PROBNP" in the last 168 hours.  DDimer No results for input(s): "DDIMER" in the last 168 hours.   Radiology    ECHOCARDIOGRAM COMPLETE  Result Date: 02/22/2023    ECHOCARDIOGRAM REPORT   Patient Name:   TONIE ADKISSON Date of Exam: 02/22/2023 Medical Rec #:  [id number]      Height:       67.0 in Accession #:    [id number]     Weight:       144.8 lb Date of Birth:  [birth year]     BSA:          1.763 m Patient Age:    76 years       BP:           89/59 mmHg Patient Gender: M              HR:           127 bpm. Exam Location:  Inpatient Procedure: 2D Echo, Cardiac Doppler and Color Doppler Indications:    R07.9* Chest pain, unspecified  History:        Patient has prior history of Echocardiogram examinations, most                 recent 08/21/2020. CAD, Prior CABG, Aortic Valve Disease,                 Signs/Symptoms:Shortness of Breath, Chest Pain and Dyspnea; Risk                 Factors:Diabetes. AVR.                 Aortic Valve:  23 mm Edwards bioprosthetic valve is present in                 the aortic position. Procedure Date: 2010.  Sonographer:    Sheralyn Boatman RDCS Referring Phys: [id number] [facility]  Sonographer Comments: Image acquisition challenging due to patient behavioral factors. and Image acquisition challenging due to uncooperative patient. Patient rolled away from probe, in 4 piont restraints. IMPRESSIONS  1. Only limited images obtained; pt refused to continue study after initial apical images obtained; doppler of aortic valve not complete.  2. Left ventricular ejection fraction, by estimation, is 20 to 25%. The left ventricle has severely decreased function. The left ventricle demonstrates global hypokinesis. The left ventricular  internal cavity size was mildly dilated. There is mild left ventricular hypertrophy. Left ventricular diastolic function could not be evaluated.  3. Right ventricular systolic function is moderately reduced. The right ventricular size is normal.  4. Left atrial size was moderately dilated.  5. The mitral valve is degenerative. Mild mitral valve regurgitation. No evidence of mitral stenosis. Moderate mitral annular calcification.  6. The aortic valve has been repaired/replaced. Aortic valve regurgitation is not visualized. There is a 23 mm Edwards bioprosthetic valve present in the aortic position. Procedure Date: 2010.  7. Aortic dilatation noted. There is mild dilatation of the ascending aorta, measuring 40 mm. FINDINGS  Left Ventricle: Left ventricular ejection fraction, by estimation, is 20 to 25%. The left ventricle has severely decreased function. The left ventricle demonstrates global hypokinesis. The left ventricular internal cavity size was mildly dilated. There is mild left ventricular hypertrophy. Left ventricular diastolic function could not be evaluated due to atrial fibrillation. Left ventricular diastolic function could not be evaluated. Right Ventricle: The right ventricular size is normal. Right ventricular systolic function is moderately reduced. Left Atrium: Left atrial size was moderately dilated. Right Atrium: Right atrial size was normal in size. Pericardium: There is no evidence of pericardial effusion. Mitral Valve: The mitral valve is degenerative in appearance. Moderate mitral annular calcification. Mild mitral valve regurgitation. No evidence of mitral valve stenosis. Tricuspid Valve: The tricuspid valve is normal in structure. Tricuspid valve regurgitation is mild . No evidence of tricuspid stenosis. Aortic Valve: The aortic valve has been repaired/replaced. Aortic valve regurgitation is not visualized. There is a 23 mm Edwards bioprosthetic valve present in the aortic position. Procedure  Date: 2010. Pulmonic Valve: The pulmonic valve was normal in structure. Pulmonic valve regurgitation is trivial. No evidence of pulmonic stenosis. Aorta: Aortic dilatation noted. There is mild dilatation of the ascending aorta, measuring 40 mm. IAS/Shunts: The interatrial septum was not assessed. Additional Comments: Only limited images obtained; pt refused to continue study after initial apical images obtained; doppler of aortic valve not complete.  LEFT VENTRICLE PLAX 2D LVIDd:         5.50 cm LVIDs:         5.00 cm LV PW:         1.30 cm LV IVS:        1.10 cm LVOT diam:     2.10 cm LVOT Area:     3.46 cm  LEFT ATRIUM           Index LA diam:      4.60 cm 2.61 cm/m LA Vol (A4C): 54.3 ml 30.80 ml/m   AORTA Ao Root diam: 3.60 cm Ao Asc diam:  3.90 cm  SHUNTS Systemic Diam: 2.10 cm Olga Millers MD Electronically signed by Olga Millers MD Signature Date/Time:  02/22/2023/8:44:24 AM    Final    DG CHEST PORT 1 VIEW  Result Date: 02/21/2023 CLINICAL DATA:  Shortness of breath. EXAM: PORTABLE CHEST 1 VIEW COMPARISON:  Radiograph yesterday, CT 01/21/2023 FINDINGS: Patient is rotated. Prior median sternotomy. Cardiac valve replacement. Stable cardiomegaly. Unchanged mediastinal contours. Increasing linear opacity in the right mid lung may represent fluid in the fissure. Suspect small pleural effusions. Progressive interstitial thickening which may represent pulmonary edema superimposed on chronic lung disease. No pneumothorax. IMPRESSION: 1. Progressive interstitial thickening which may represent pulmonary edema superimposed on chronic lung disease. 2. Suspect small pleural effusions. 3. Stable cardiomegaly. Electronically Signed   By: Narda Rutherford M.D.   On: 02/21/2023 17:33    Cardiac Studies    ECHO: 02/22/2023  1. Only limited images obtained; pt refused to continue study after  initial apical images obtained; doppler of aortic valve not complete.   2. Left ventricular ejection fraction, by  estimation, is 20 to 25%. The  left ventricle has severely decreased function. The left ventricle  demonstrates global hypokinesis. The left ventricular internal cavity size  was mildly dilated. There is mild left  ventricular hypertrophy. Left ventricular diastolic function could not be  evaluated.   3. Right ventricular systolic function is moderately reduced. The right  ventricular size is normal.   4. Left atrial size was moderately dilated.   5. The mitral valve is degenerative. Mild mitral valve regurgitation. No  evidence of mitral stenosis. Moderate mitral annular calcification.   6. The aortic valve has been repaired/replaced. Aortic valve  regurgitation is not visualized. There is a 23 mm Edwards bioprosthetic  valve present in the aortic position. Procedure Date: 2010.   7. Aortic dilatation noted. There is mild dilatation of the ascending  aorta, measuring 40 mm.    Patient Profile     Mr Manglicmot is a 77 y.o. male past medical history of CAD s/p 3v CABG (LIMA to LAD, SVG to OM2, SVG to PDA), aortic valve replacement, diabetes, hypertension, hyperlipidemia, gait and balance who presented with chest pain.   Assessment & Plan    NSTEMI: Patient underwent CABG surgery x 3 in 2010 with a LIMA to LAD, SVG to OM2, and SVG to PDA.  At the same time he underwent aortic valve replacement with a bioprosthetic aortic valve.  He has been documented to have occlusion of the SVG to PDA on subsequent catheterization in 2017.  Developed recurrent anginal symptomatology leading to present admission.  Troponins have increased to 6445 consistent with non-STEMI.  He was scheduled to undergo cardiac catheterization 8/12 but developed significant volume overload/CHF leading to transient BiPAP, IV Lasix, and subsequent transfer to ICU.  Yesterday he was extremely restless requiring restraints.  When, he is significantly improved.  He does not have obvious shortness of breath.  Sounds have improved.  He  continues to have sinus tachycardia.  ECG earlier today showed sinus tachycardia at 130.  Presently this rhythm has improved to continue metoprolol.  He denies chest pain.  Initially on heparin; level was supratherapeutic leading to slight dose reduction. Cardiomyopathy: 2D echo Doppler study straits severe LV dysfunction with EF 20 to 25%.  Status post AVR;  unable to be assessed on the echocardiogram at the time was performed due to his significant restlessness.  This morning demonstrates severe LV dysfunction with EF 20 to 25%.  The patient was very restless. Definity contrast was not able to be given for further assessment of potential  LV thrombus in  the setting of apical akinesis. HFrEF: Ultimately will require right and left heart cardiac catheterization.  He received furosemide IV 80 mg yesterday and has been on 60 mg twice daily.  I/O s today is -1834 since admission.  Will change  to 60 mg IV daily.  He is on carvedilol 3.125 twice a day and isosorbide 30 AKI: Creatinine is increased to 0.13 today from 1.86 yesterday.  Catheterization presently depending on renal function possibly tomorrow or early next week. Transaminase elevation with AST 106 ALT 69 > 95/65 today. May be contributed by volume overload.  He is on atorvastatin 20 mg. Monitor LFT's.  Statin on hold. Agitation/delirium: Essentially resolved today.  He is now on reduced dose of Precedex at 0.1. Anemia: H/H 10.8, 32.5. Hyperlidiemia: Cholesterol 87, LDL cholesterol 37.  Triglycerides 171.      For questions or updates, please contact [facility] Please consult [email or web address] for contact info under       Signed, Nicki Guadalajara, MD  02/23/2023, 9:02 AM

## 2023-02-23 NOTE — Progress Notes (Signed)
ANTICOAGULATION CONSULT NOTE - Follow-up Consult  Pharmacy Consult for Heparin  Indication: chest pain/ACS  Allergies  Allergen Reactions   Shellfish-Derived Products Anaphylaxis   Zolpidem Tartrate Other (See Comments)    Hallucinations    Patient Measurements: Height: 5\' 7"  (170.2 cm) Weight: 68.7 kg (151 lb 7.3 oz) IBW/kg (Calculated) : 66.1 Heparin DW = 68 kg  Vital Signs: Temp: 98.2 F (36.8 C) (08/15 0755) Temp Source: Oral (08/15 0755) BP: 114/67 (08/15 0900) Pulse Rate: 98 (08/15 0900)  Labs: Recent Labs    02/21/23 0624 02/21/23 0925 02/21/23 1618 02/21/23 1815 02/21/23 2110 02/22/23 0008 02/22/23 1055 02/22/23 2141 02/23/23 0002 02/23/23 0536  HGB  --  10.6* 12.6*  --   --  10.8*  --   --  9.8*  --   HCT  --  32.4* 37.0*  --   --  32.5*  --   --  29.9*  --   PLT  --  177  --   --   --  178  --   --  138*  --   HEPARINUNFRC  --  0.21*  --   --   --   --    < > 0.31 0.23* 0.72*  CREATININE  --  1.56*  --   --   --  1.86*  --   --  2.13*  --   TROPONINIHS 31*  --   --  4,527* 5,284*  --   --   --   --   --    < > = values in this interval not displayed.    Estimated Creatinine Clearance: 27.2 mL/min (A) (by C-G formula based on SCr of 2.13 mg/dL (H)).   Medical History: Past Medical History:  Diagnosis Date   Allergy    Aortic stenosis    Arthritis    Balance problem 04/26/2022   Blood transfusion without reported diagnosis    CAD (coronary artery disease)    Cataract    both eyes surgically removed   Diabetes mellitus, type 2 (HCC)    Diverticulosis of colon (without mention of hemorrhage)    Duodenal ulcer perforation (HCC)    blood transfusion   Flesh-eating bacteria (HCC) 1995   GERD (gastroesophageal reflux disease)    Glaucoma    pre glaucoma on Xalatan drops   Heart murmur    History of diabetic retinopathy    History of gastrointestinal bleeding    History of necrotizing fasciitis    History of prosthetic aortic valve     Hypercholesteremia    Myocardial infarction (HCC) 02/23/2016   Neuropathic pain 04/26/2022   Nightmares    CHRONIC   OSA (obstructive sleep apnea)    borderline no CPAP    Personal history of colonic polyps 11/23/2009   TUBULAR ADENOMA   Postoperative anemia    Sinus bradycardia    Sleep apnea    no cpap   Stomach ulcer    Hx of   Systolic hypertension    Vitamin B12 deficiency     Assessment: 77 y/o M with chest pain at rest beginning 8/11, unrelieved by SL NTG, some help with morphine.  Plan for LHC today, 8/14, but patient was too agitated for procedure, so did not take place. No anticoagulation PTA.  Heparin level 0.72 was supratherapeutic on 1250 units/hr. Issues with line have been resolved. No s/sx bleeding. Hgb 10s and Plt 170s stable.  Goal of Therapy:  Heparin level 0.3-0.7 units/ml Monitor platelets by  anticoagulation protocol: Yes   Plan:  Reduce heparin drip at 1100 units/hr Follow-up level at 1300 today (~6 hours after dose change)  Monitor daily CBC/Heparin levels while on UFH Monitor for  signs of bleeding  Wilmer Floor, PharmD PGY2 Cardiology Pharmacy Resident

## 2023-02-23 NOTE — Progress Notes (Signed)
ANTICOAGULATION CONSULT NOTE - Follow-up Consult  Pharmacy Consult for Heparin  Indication: chest pain/ACS  Allergies  Allergen Reactions   Shellfish-Derived Products Anaphylaxis   Zolpidem Tartrate Other (See Comments)    Hallucinations    Patient Measurements: Height: 5\' 7"  (170.2 cm) Weight: 68.7 kg (151 lb 7.3 oz) IBW/kg (Calculated) : 66.1 Heparin DW = 68 kg  Vital Signs: Temp: 98.2 F (36.8 C) (08/15 1631) Temp Source: Oral (08/15 1631) BP: 121/84 (08/15 1600) Pulse Rate: 137 (08/15 1600)  Labs: Recent Labs    02/21/23 0624 02/21/23 0925 02/21/23 1618 02/21/23 1815 02/21/23 2110 02/22/23 0008 02/22/23 1055 02/23/23 0002 02/23/23 0536 02/23/23 1445 02/23/23 1616  HGB  --  10.6* 12.6*  --   --  10.8*  --  9.8*  --   --   --   HCT  --  32.4* 37.0*  --   --  32.5*  --  29.9*  --   --   --   PLT  --  177  --   --   --  178  --  138*  --   --   --   APTT  --   --   --   --   --   --   --   --   --   --  36  HEPARINUNFRC  --  0.21*  --   --   --   --    < > 0.23* 0.72* <0.10* <0.10*  CREATININE  --  1.56*  --   --   --  1.86*  --  2.13*  --   --   --   TROPONINIHS 31*  --   --  4,527* 4,403*  --   --   --   --   --   --    < > = values in this interval not displayed.    Estimated Creatinine Clearance: 27.2 mL/min (A) (by C-G formula based on SCr of 2.13 mg/dL (H)).   Assessment: 77 y/o M with chest pain at rest beginning 8/11, unrelieved by SL NTG, some help with morphine.  Plan for LHC today, 8/14, but patient was too agitated for procedure, so did not take place. No anticoagulation PTA.  HL <0.10 -undetectably low; and again on repeat check D/w RN - no issues with line today and infusion has been running appropriately. No s/sx bleeding   Goal of Therapy:  Heparin level 0.3-0.7 units/ml Monitor platelets by anticoagulation protocol: Yes   Plan:  Increase heparin drip back to previously therapeutic rate of 1250 units/hr  Follow-up level at 2300 today (~6  hours after dose change)  Monitor daily CBC/Heparin levels while on UFH Monitor for  signs of bleeding  Calton Dach, PharmD, BCCCP Clinical Pharmacist 02/23/2023 5:04 PM

## 2023-02-24 ENCOUNTER — Inpatient Hospital Stay (HOSPITAL_COMMUNITY): Payer: Medicare Other

## 2023-02-24 DIAGNOSIS — I480 Paroxysmal atrial fibrillation: Secondary | ICD-10-CM

## 2023-02-24 DIAGNOSIS — Z952 Presence of prosthetic heart valve: Secondary | ICD-10-CM | POA: Diagnosis not present

## 2023-02-24 DIAGNOSIS — I25118 Atherosclerotic heart disease of native coronary artery with other forms of angina pectoris: Secondary | ICD-10-CM | POA: Diagnosis not present

## 2023-02-24 DIAGNOSIS — R0603 Acute respiratory distress: Secondary | ICD-10-CM | POA: Diagnosis not present

## 2023-02-24 DIAGNOSIS — I428 Other cardiomyopathies: Secondary | ICD-10-CM

## 2023-02-24 DIAGNOSIS — I5043 Acute on chronic combined systolic (congestive) and diastolic (congestive) heart failure: Secondary | ICD-10-CM | POA: Diagnosis not present

## 2023-02-24 LAB — HEPARIN LEVEL (UNFRACTIONATED)
Heparin Unfractionated: 0.24 [IU]/mL — ABNORMAL LOW (ref 0.30–0.70)
Heparin Unfractionated: 0.35 [IU]/mL (ref 0.30–0.70)
Heparin Unfractionated: 0.36 [IU]/mL (ref 0.30–0.70)

## 2023-02-24 LAB — COMPREHENSIVE METABOLIC PANEL
ALT: 56 U/L — ABNORMAL HIGH (ref 0–44)
AST: 82 U/L — ABNORMAL HIGH (ref 15–41)
Albumin: 2.6 g/dL — ABNORMAL LOW (ref 3.5–5.0)
Alkaline Phosphatase: 75 U/L (ref 38–126)
Anion gap: 14 (ref 5–15)
BUN: 50 mg/dL — ABNORMAL HIGH (ref 8–23)
CO2: 21 mmol/L — ABNORMAL LOW (ref 22–32)
Calcium: 8.5 mg/dL — ABNORMAL LOW (ref 8.9–10.3)
Chloride: 99 mmol/L (ref 98–111)
Creatinine, Ser: 1.99 mg/dL — ABNORMAL HIGH (ref 0.61–1.24)
GFR, Estimated: 34 mL/min — ABNORMAL LOW (ref >60)
Glucose, Bld: 115 mg/dL — ABNORMAL HIGH (ref 70–99)
Potassium: 3.5 mmol/L (ref 3.5–5.1)
Sodium: 134 mmol/L — ABNORMAL LOW (ref 135–145)
Total Bilirubin: 2.6 mg/dL — ABNORMAL HIGH (ref 0.3–1.2)
Total Protein: 5.8 g/dL — ABNORMAL LOW (ref 6.5–8.1)

## 2023-02-24 LAB — ECHOCARDIOGRAM LIMITED
AR max vel: 0.83 cm2
AV Area VTI: 0.88 cm2
AV Area mean vel: 0.84 cm2
AV Mean grad: 7.5 mmHg
AV Peak grad: 14.8 mmHg
Ao pk vel: 1.93 m/s
Height: 67 in
Weight: 2423.3 oz

## 2023-02-24 LAB — GLUCOSE, CAPILLARY
Glucose-Capillary: 118 mg/dL — ABNORMAL HIGH (ref 70–99)
Glucose-Capillary: 226 mg/dL — ABNORMAL HIGH (ref 70–99)
Glucose-Capillary: 170 mg/dL — ABNORMAL HIGH (ref 70–99)
Glucose-Capillary: 176 mg/dL — ABNORMAL HIGH (ref 70–99)

## 2023-02-24 LAB — MAGNESIUM: Magnesium: 1.7 mg/dL (ref 1.7–2.4)

## 2023-02-24 MED ORDER — AMIODARONE HCL IN DEXTROSE 360-4.14 MG/200ML-% IV SOLN
60.0000 mg/h | INTRAVENOUS | Status: AC
  Administered 2023-02-24: 60 mg/h via INTRAVENOUS
  Filled 2023-02-24 (×2): qty 200

## 2023-02-24 MED ORDER — POTASSIUM CHLORIDE CRYS ER 20 MEQ PO TBCR
20.0000 meq | EXTENDED_RELEASE_TABLET | Freq: Once | ORAL | Status: AC
  Administered 2023-02-24: 20 meq via ORAL
  Filled 2023-02-24: qty 1

## 2023-02-24 MED ORDER — MAGNESIUM SULFATE 2 GM/50ML IV SOLN
2.0000 g | Freq: Once | INTRAVENOUS | Status: AC
  Administered 2023-02-24: 2 g via INTRAVENOUS
  Filled 2023-02-24: qty 50

## 2023-02-24 MED ORDER — CARVEDILOL 6.25 MG PO TABS
6.2500 mg | ORAL_TABLET | Freq: Two times a day (BID) | ORAL | Status: DC
  Administered 2023-02-24 – 2023-02-25 (×3): 6.25 mg via ORAL
  Filled 2023-02-24 (×3): qty 1

## 2023-02-24 MED ORDER — AMIODARONE LOAD VIA INFUSION
150.0000 mg | Freq: Once | INTRAVENOUS | Status: AC
  Administered 2023-02-24: 150 mg via INTRAVENOUS
  Filled 2023-02-24: qty 83.34

## 2023-02-24 MED ORDER — SODIUM CHLORIDE 0.9 % IV SOLN: INTRAVENOUS | Status: DC | PRN

## 2023-02-24 MED ORDER — ATORVASTATIN CALCIUM 10 MG PO TABS
20.0000 mg | ORAL_TABLET | Freq: Every day | ORAL | Status: DC
  Administered 2023-02-24: 20 mg via ORAL
  Filled 2023-02-24: qty 2

## 2023-02-24 MED ORDER — AMIODARONE HCL IN DEXTROSE 360-4.14 MG/200ML-% IV SOLN
30.0000 mg/h | INTRAVENOUS | Status: DC
  Administered 2023-02-24 – 2023-02-25 (×2): 30 mg/h via INTRAVENOUS
  Filled 2023-02-24 (×2): qty 200

## 2023-02-24 NOTE — Progress Notes (Addendum)
ANTICOAGULATION CONSULT NOTE - Follow-up Consult  Pharmacy Consult for Heparin  Indication: chest pain/ACS  Allergies  Allergen Reactions   Shellfish-Derived Products Anaphylaxis   Zolpidem Tartrate Other (See Comments)    Hallucinations    Patient Measurements: Height: 5\' 7"  (170.2 cm) Weight: 68.7 kg (151 lb 7.3 oz) IBW/kg (Calculated) : 66.1 Heparin DW = 68 kg  Vital Signs: Temp: 97.7 F (36.5 C) (08/16 1600) Temp Source: Oral (08/16 1600) BP: Nathan/58 (08/16 1500) Pulse Rate: 75 (08/16 1500)  Labs: Recent Labs     0000 02/21/23 1815 02/21/23 2110 02/22/23 0008 02/22/23 1055 02/23/23 0002 02/23/23 0536 02/23/23 1616 02/23/23 2335 02/24/23 0733 02/24/23 1611  HGB   < >  --   --  10.8*  --  9.8*  --   --  10.2*  --   --   HCT  --   --   --  32.5*  --  29.9*  --   --  29.8*  --   --   PLT  --   --   --  178  --  138*  --   --  168  --   --   APTT  --   --   --   --   --   --   --  36  --   --   --   HEPARINUNFRC  --   --   --   --    < > 0.23*   < > <0.10* 0.21* 0.24* 0.35  CREATININE  --   --   --  1.Nathan*  --  2.13*  --   --  1.99*  --   --   TROPONINIHS  --  4,527* 1,914*  --   --   --   --   --   --   --   --    < > = values in this interval not displayed.    Estimated Creatinine Clearance: 29.1 mL/min (A) (by C-G formula based on SCr of 1.99 mg/dL (H)).   Assessment: 77 y/o [name] with chest pain at rest beginning 8/11, unrelieved by SL NTG, some help with morphine.  Unable to get LHC on 8/14 due to agitation, now awaiting improvement in renal function. No anticoagulation PTA, note potential LV thrombus but unable to be confirm due to inability to receive contrast. ECHO from today notes inability to exclude thrombus. Likely LHC on Monday.   Heparin level therapeutic at 0.35 on 1500 units/hr. No s/sx of bleeding or infusion issues noted.   Goal of Therapy:  Heparin level 0.3-0.7 units/ml Monitor platelets by anticoagulation protocol: Yes   Plan:  Continue  heparin at 1500u/hr.  Recheck confirmatory heparin level with AM labs.  Monitor daily CBC/Heparin levels while on UFH Monitor for  signs of bleeding  Thank you for allowing pharmacy to participate in this patient's care,  Estill Batten, PharmD, BCCCP  Clinical Pharmacist  Phone: [phone] 02/24/2023 6:05 PM  Please check AMION for all [facility] Pharmacy phone numbers After 10:00 PM, call Main Pharmacy [phone]

## 2023-02-24 NOTE — Progress Notes (Addendum)
ANTICOAGULATION CONSULT NOTE - Follow-up Consult  Pharmacy Consult for Heparin  Indication: chest pain/ACS  Allergies  Allergen Reactions   Shellfish-Derived Products Anaphylaxis   Zolpidem Tartrate Other (See Comments)    Hallucinations    Patient Measurements: Height: 5\' 7"  (170.2 cm) Weight: 68.7 kg (151 lb 7.3 oz) IBW/kg (Calculated) : 66.1 Heparin DW = 68 kg  Vital Signs: Temp: 98.9 F (37.2 C) (08/15 2300) Temp Source: Oral (08/15 1900) BP: 101/72 (08/15 2000) Pulse Rate: 122 (08/15 2030)  Labs: Recent Labs    02/21/23 0624 02/21/23 0925 02/21/23 1815 02/21/23 2110 02/22/23 0008 02/22/23 1055 02/23/23 0002 02/23/23 0536 02/23/23 1445 02/23/23 1616 02/23/23 2335  HGB  --    < >  --   --  10.8*  --  9.8*  --   --   --  10.2*  HCT  --    < >  --   --  32.5*  --  29.9*  --   --   --  29.8*  PLT  --    < >  --   --  178  --  138*  --   --   --  168  APTT  --   --   --   --   --   --   --   --   --  36  --   HEPARINUNFRC  --    < >  --   --   --    < > 0.23*   < > <0.10* <0.10* 0.21*  CREATININE  --    < >  --   --  1.86*  --  2.13*  --   --   --  1.99*  TROPONINIHS 31*  --  4,527* 6,445*  --   --   --   --   --   --   --    < > = values in this interval not displayed.    Estimated Creatinine Clearance: 29.1 mL/min (A) (by C-G formula based on SCr of 1.99 mg/dL (H)).   Assessment: 77 y/o M with chest pain at rest beginning 8/11, unrelieved by SL NTG, some help with morphine.  Plan for LHC today, 8/14, but patient was too agitated for procedure, so did not take place. No anticoagulation PTA.  Heparin level tonight came back at 0.21, on 1250 units/hr. No s/sx of bleeding or infusion issues.   Goal of Therapy:  Heparin level 0.3-0.7 units/ml Monitor platelets by anticoagulation protocol: Yes   Plan:  Increase heparin drip to 1350 units/hr  Order heparin level in 6-8 hours with AM labs  Monitor daily CBC/Heparin levels while on UFH Monitor for  signs of  bleeding  Thank you for allowing pharmacy to participate in this patient's care,  Sherron Monday, PharmD, BCCCP Clinical Pharmacist  Phone: [phone] 02/24/2023 12:25 AM  Please check AMION for all [facility] Pharmacy phone numbers After 10:00 PM, call Main Pharmacy [phone]

## 2023-02-24 NOTE — Evaluation (Signed)
Physical Therapy Evaluation Patient Details Name: Nathan [name] MRN: [id number] DOB: [birth year] Today's Date: 02/24/2023  History of Present Illness  Pt is 77 yo male presented on 02/20/23 with chest pain and found to have NSTEMI.  Pt was scheduled to undergo cardiac cath but developed overload/CHF requiring transfer to ICU. Pt with hx including but not limited to CAD s/p CABGx3 in 2010, aortic valve replacement, DM, HTN, HLD, MI, OSA  Clinical Impression  Pt admitted with above diagnosis. At baseline, pt independent and ambulatory without AD.  Today, pt requiring contact guard to min A with RW and ambulated 70'.  He did fatigue easily.  Pt with soft BP but stable (90's/60's) and on RA.  He had some c/o L LE weakness but MMT at 5/5, equal to R, and no deficits in L LE noted with gait. Pt expected to progress well with therapy and recommend HHPT at d/c.  Pt currently with functional limitations due to the deficits listed below (see PT Problem List). Pt will benefit from acute skilled PT to increase their independence and safety with mobility to allow discharge.           If plan is discharge home, recommend the following: A little help with walking and/or transfers;A little help with bathing/dressing/bathroom;Assistance with cooking/housework;Help with stairs or ramp for entrance   Can travel by private vehicle        Equipment Recommendations Rolling walker (2 wheels)  Recommendations for Other Services       Functional Status Assessment Patient has had a recent decline in their functional status and demonstrates the ability to make significant improvements in function in a reasonable and predictable amount of time.     Precautions / Restrictions Precautions Precautions: Fall      Mobility  Bed Mobility Overal bed mobility: Needs Assistance Bed Mobility: Sit to Supine       Sit to supine: Min assist        Transfers Overall transfer level: Needs assistance Equipment  used: Rolling walker (2 wheels) Transfers: Sit to/from Stand Sit to Stand: Contact guard assist           General transfer comment: Cues for safety    Ambulation/Gait Ambulation/Gait assistance: Min assist Gait Distance (Feet): 70 Feet Assistive device: Rolling walker (2 wheels) Gait Pattern/deviations: Step-through pattern, Decreased stride length Gait velocity: decreased     General Gait Details: Overall CGA to ambulate but had 2 episodes requiring min A when he fatigued.  Stairs            Wheelchair Mobility     Tilt Bed    Modified Rankin (Stroke Patients Only)       Balance Overall balance assessment: Needs assistance Sitting-balance support: No upper extremity supported Sitting balance-Leahy Scale: Good     Standing balance support: Bilateral upper extremity supported, Reliant on assistive device for balance Standing balance-Leahy Scale: Poor Standing balance comment: Requriign RW                             Pertinent Vitals/Pain Pain Assessment Pain Assessment: Faces Faces Pain Scale: Hurts a little bit Pain Location: Bil LE Pain Descriptors / Indicators: Discomfort, Tightness Pain Intervention(s): Limited activity within patient's tolerance, Monitored during session    Home Living Family/patient expects to be discharged to:: Private residence Living Arrangements: Spouse/significant other Available Help at Discharge: Family;Available 24 hours/day Type of Home: House Home Access: Stairs to enter Entrance Stairs-Rails: None  Entrance Stairs-Number of Steps: 2 at back   Home Layout: One level Home Equipment: Grab bars - tub/shower;Shower seat Additional Comments: "couple walking sticks"    Prior Function Prior Level of Function : Independent/Modified Independent;Driving             Mobility Comments: Reports independent with ambulation, denies falls, reports does not use AD unless walking down to creek then uses walking  sticks ADLs Comments: independent with adls and iadls     Extremity/Trunk Assessment   Upper Extremity Assessment Upper Extremity Assessment: Overall WFL for tasks assessed    Lower Extremity Assessment Lower Extremity Assessment: LLE deficits/detail;RLE deficits/detail RLE Deficits / Details: ROM WFL; MMT 5/5 (ankle DF, knee flex/ext, hip flex/hip add/abd tested sitting) RLE Sensation: WNL LLE Deficits / Details: Reports L LE felt weak but all ROM WFL and MMT 5/5  and equal to R (ankle DF, knee flex/ext, hip flex/hip add/abd tested sitting) LLE Sensation: WNL    Cervical / Trunk Assessment Cervical / Trunk Assessment: Other exceptions;Kyphotic Cervical / Trunk Exceptions: foward head  Communication      Cognition Arousal: Alert Behavior During Therapy: Flat affect Overall Cognitive Status: No family/caregiver present to determine baseline cognitive functioning Area of Impairment: Problem solving                             Problem Solving: Slow processing          General Comments General comments (skin integrity, edema, etc.): Pt had c/o fatigue and shortness of breath with activity but all VSS.  HR 70-100 bpm during session, O2 sats 98%,  BP was soft but stable and similar to recent reading (98/66).    Exercises     Assessment/Plan    PT Assessment Patient needs continued PT services  PT Problem List Decreased strength;Cardiopulmonary status limiting activity;Decreased knowledge of use of DME;Decreased activity tolerance;Decreased balance;Decreased mobility;Decreased knowledge of precautions       PT Treatment Interventions DME instruction;Therapeutic exercise;Gait training;Balance training;Stair training;Functional mobility training;Therapeutic activities;Patient/family education;Modalities    PT Goals (Current goals can be found in the Care Plan section)  Acute Rehab PT Goals Patient Stated Goal: return home PT Goal Formulation: With patient Time  For Goal Achievement: 03/10/23 Potential to Achieve Goals: Good    Frequency Min 1X/week     Co-evaluation               AM-PAC PT "6 Clicks" Mobility  Outcome Measure Help needed turning from your back to your side while in a flat bed without using bedrails?: A Little Help needed moving from lying on your back to sitting on the side of a flat bed without using bedrails?: A Little Help needed moving to and from a bed to a chair (including a wheelchair)?: A Little Help needed standing up from a chair using your arms (e.g., wheelchair or bedside chair)?: A Little Help needed to walk in hospital room?: A Little Help needed climbing 3-5 steps with a railing? : A Lot 6 Click Score: 17    End of Session Equipment Utilized During Treatment: Gait belt Activity Tolerance: Patient limited by fatigue Patient left: in bed;with call bell/phone within reach;with bed alarm set Nurse Communication: Mobility status PT Visit Diagnosis: Other abnormalities of gait and mobility (R26.89);Muscle weakness (generalized) (M62.81)    Time: 1610-9604 PT Time Calculation (min) (ACUTE ONLY): 29 min   Charges:   PT Evaluation $PT Eval Low Complexity: 1 Low PT  Treatments $Gait Training: 8-22 mins PT General Charges $$ ACUTE PT VISIT: 1 Visit         Anise Salvo, PT Acute Rehab Services [facility] Re[facility] [phone]   Rayetta Humphrey 02/24/2023, 1:48 PM

## 2023-02-24 NOTE — Progress Notes (Addendum)
Rounding Note    Patient Name: Nathan [name] Date of Encounter: 02/24/2023  [facility] Cardiologist: Donato Schultz, MD   Subjective    Feels much better; no longer agitated; no chest pain  Inpatient Medications    Scheduled Meds:  allopurinol  300 mg Oral Daily   amiodarone  150 mg Intravenous Once   vitamin C  1,000 mg Oral Daily   aspirin EC  81 mg Oral Daily   carvedilol  3.125 mg Oral BID WC   Chlorhexidine Gluconate Cloth  6 each Topical Daily   furosemide  60 mg Intravenous Daily   insulin aspart  0-15 Units Subcutaneous TID WC   isosorbide mononitrate  30 mg Oral Daily   mouth rinse  15 mL Mouth Rinse 4 times per day   Continuous Infusions:  sodium chloride Stopped (02/24/23 0648)   sodium chloride Stopped (02/21/23 1508)   amiodarone     Followed by   amiodarone     heparin 1,350 Units/hr (02/24/23 0700)   PRN Meds: sodium chloride, acetaminophen, nitroGLYCERIN, mouth rinse, oxyCODONE-acetaminophen   Vital Signs    Vitals:   02/24/23 0500 02/24/23 0600 02/24/23 0700 02/24/23 0730  BP: (!) 123/104 (!) 138/108 117/73   Pulse: (!) 108 99 (!) 113   Resp: (!) 30 (!) 32 (!) 35   Temp:    98.4 F (36.9 C)  TempSrc:    Oral  SpO2: 90% 95% 95%   Weight:      Height:        Intake/Output Summary (Last 24 hours) at 02/24/2023 0843 Last data filed at 02/24/2023 0700 Gross per 24 hour  Intake 997.79 ml  Output 2570 ml  Net -1572.21 ml   I/O since admission -3282 since admission          02/23/2023    6:00 AM 02/22/2023    6:00 AM 02/20/2023   10:17 PM  Last 3 Weights  Weight (lbs) 151 lb 7.3 oz 144 lb 13.5 oz 150 lb  Weight (kg) 68.7 kg 65.7 kg 68.04 kg      Telemetry    Sinus tachycardia at 110.  - Personally Reviewed  ECG    Atrial fibrillation 95 - 110 - Personally Reviewed  Physical Exam   BP 117/73   Pulse (!) 113   Temp 98.4 F (36.9 C) (Oral)   Resp (!) 35   Ht 5\' 7"  (1.702 m)   Wt 68.7 kg   SpO2 95%   BMI 23.72  kg/m  General: Alert, oriented, no distress.  Skin: normal turgor, no rashes, warm and dry HEENT: Normocephalic, atraumatic.  Nose without nasal septal hypertrophy Mouth/Parynx benign; Mallinpatti scale 3/4 Neck: No JVD, no carotid bruits; normal carotid upstroke Lungs: Improved aeration Chest wall: without tenderness to palpitation Heart: PMI not displaced, irregularly irregular with ventricular rate around 95-1 10., s1 s2 normal, 1/6 systolic murmur, no diastolic murmur, no rubs, gallops, thrills, or heaves Abdomen: soft, nontender; no hepatosplenomehaly, BS+; abdominal aorta nontender and not dilated by palpation. Back: no CVA tenderness Pulses 2+ Musculoskeletal: full range of motion, normal strength, no joint deformities Extremities: no clubbing cyanosis or edema, Homan's sign negative  Neurologic: grossly nonfocal; Cranial nerves grossly wnl Psychologic: Normal mood and affect    Labs    High Sensitivity Troponin:   Recent Labs  Lab 02/20/23 2120 02/20/23 2344 02/21/23 0624 02/21/23 1815 02/21/23 2110  TROPONINIHS 13 12 31* 4,527* 6,445*     Chemistry Recent Labs  Lab  02/22/23 0008 02/22/23 1055 02/23/23 0002 02/23/23 2335  NA 136  --  137 134*  K 3.8  --  4.7 3.5  CL 100  --  103 99  CO2 17*  --  20* 21*  GLUCOSE 182*  --  178* 115*  BUN 27*  --  44* 50*  CREATININE 1.86*  --  2.13* 1.99*  CALCIUM 8.7*  --  8.6* 8.5*  MG  --  1.2* 2.2 1.7  PROT 6.1*  --  6.0* 5.8*  ALBUMIN 3.2*  --  2.9* 2.6*  AST 106*  --  95* 82*  ALT 69*  --  59* 56*  ALKPHOS 82  --  68 75  BILITOT 2.4*  --  2.6* 2.6*  GFRNONAA 37*  --  31* 34*  ANIONGAP 19*  --  14 14    Lipids  Recent Labs  Lab 02/23/23 0002  CHOL 87  TRIG 171*  HDL 16*  LDLCALC 37  CHOLHDL 5.4    Hematology Recent Labs  Lab 02/22/23 0008 02/23/23 0002 02/23/23 2335  WBC 20.7* 14.0* 17.6*  RBC 3.79* 3.41* 3.62*  HGB 10.8* 9.8* 10.2*  HCT 32.5* 29.9* 29.8*  MCV 85.8 87.7 82.3  MCH 28.5 28.7 28.2   MCHC 33.2 32.8 34.2  RDW 17.2* 17.3* 16.7*  PLT 178 138* 168   Thyroid  Recent Labs  Lab 02/23/23 0002  TSH 0.953    BNPNo results for input(s): "BNP", "PROBNP" in the last 168 hours.  DDimer No results for input(s): "DDIMER" in the last 168 hours.   Radiology    No results found.  Cardiac Studies    ECHO: 02/22/2023  1. Only limited images obtained; pt refused to continue study after  initial apical images obtained; doppler of aortic valve not complete.   2. Left ventricular ejection fraction, by estimation, is 20 to 25%. The  left ventricle has severely decreased function. The left ventricle  demonstrates global hypokinesis. The left ventricular internal cavity size  was mildly dilated. There is mild left  ventricular hypertrophy. Left ventricular diastolic function could not be  evaluated.   3. Right ventricular systolic function is moderately reduced. The right  ventricular size is normal.   4. Left atrial size was moderately dilated.   5. The mitral valve is degenerative. Mild mitral valve regurgitation. No  evidence of mitral stenosis. Moderate mitral annular calcification.   6. The aortic valve has been repaired/replaced. Aortic valve  regurgitation is not visualized. There is a 23 mm Edwards bioprosthetic  valve present in the aortic position. Procedure Date: 2010.   7. Aortic dilatation noted. There is mild dilatation of the ascending  aorta, measuring 40 mm.    Patient Profile     Nathan [name] is a 77 y.o. male past medical history of CAD s/p 3v CABG (LIMA to LAD, SVG to OM2, SVG to PDA), aortic valve replacement, diabetes, hypertension, hyperlipidemia, gait and balance who presented with chest pain.   Assessment & Plan    NSTEMI: Patient underwent CABG surgery x 3 in 2010 with a LIMA to LAD, SVG to OM2, and SVG to PDA.  At the same time he underwent aortic valve replacement with a bioprosthetic aortic valve.  He has been documented to have occlusion of the SVG  to PDA on subsequent catheterization in 2017.  Developed recurrent anginal symptomatology leading to present admission.  Troponins have increased to 6445 consistent with non-STEMI.  He was scheduled to undergo cardiac catheterization 8/12 but  developed significant volume overload/CHF leading to transient BiPAP, IV Lasix, and subsequent transfer to ICU.  Subsequently, he became  extremely restless requiring restraints.  This is now resolved.  He has been diuresed.  He denies recurrent chest pain.  Breathing has improved.  He continues to be on heparin for anticoagulation.  Due to rising creatinine with slight improvement he is still not ready for definitive cardiac catheterization. Cardiomyopathy: 2D echo Doppler study straits severe LV dysfunction with EF 20 to 25%.  Status post AVR;  unable to be assessed on the echocardiogram at the time was performed due to his significant restlessness.  This morning demonstrates severe LV dysfunction with EF 20 to 25%.  The patient was very restless. Definity contrast was not able to be given for further assessment of potential  LV thrombus in the setting of apical akinesis. HFrEF: Ultimately will require right and left heart cardiac catheterization.  He received furosemide IV 80 mg and then was of 60 IV twice daily and changed to 60 daily yesterday.  I/O now -3282 since admission.  He is had been started on carvedilol 3.125 mg twice a day and isosorbide 30 mg daily.  Will titrate carvedilol to 6.25 mg twice a day today.   Atrial Fibrillation: ECG today confirms AF at ventricular rate at 105 bpm.  Will initiate IV amiodarone bolus plus infusion, carvedilol to increase to 6.25 mg twice a day.  Patient has been on heparin for anticoagulation. AKI: Creatinine has increased from admission value at 1.28 > 1.56 > 1.86 > 2.13 > 1.99 today.  He has good diuresis.  He is breathing more comfortably today.  Hopefully will continue to improve and defer right and left heart cardiac  catheterization possibly on Monday 8/19 stable. Transaminase elevation with AST 106 ALT 69 > 95/65  > 82/56 today. May be contributed by volume overload.  He was on atorvastatin 20 mg which was held for 2 days.   Agitation/delirium: Resolved.  He is no longer on Precedex or Haldol.   Anemia: H/H 10.8, 32.5 > 9.8/29.9 > 10.2/29.8 Hyperlidiemia: Cholesterol 87, LDL cholesterol 37.  Triglycerides 171.   Patient appears to be slowly improving.  Will plan to repeat echo Doppler study today since when done several days ago the aortic valve was unable to be assessed due to the patient's significant agitation.  tentatively plan for right and left heart cardiac catheterization on Monday if stable   For questions or updates, please contact [facility] Please consult [email or web address] for contact info under       Signed, Nicki Guadalajara, MD  02/24/2023, 8:43 AM

## 2023-02-25 ENCOUNTER — Inpatient Hospital Stay (HOSPITAL_COMMUNITY): Payer: Medicare Other

## 2023-02-25 DIAGNOSIS — R410 Disorientation, unspecified: Secondary | ICD-10-CM

## 2023-02-25 DIAGNOSIS — I4819 Other persistent atrial fibrillation: Secondary | ICD-10-CM

## 2023-02-25 DIAGNOSIS — I214 Non-ST elevation (NSTEMI) myocardial infarction: Secondary | ICD-10-CM | POA: Diagnosis not present

## 2023-02-25 DIAGNOSIS — I5021 Acute systolic (congestive) heart failure: Secondary | ICD-10-CM | POA: Diagnosis not present

## 2023-02-25 DIAGNOSIS — N179 Acute kidney failure, unspecified: Secondary | ICD-10-CM | POA: Diagnosis not present

## 2023-02-25 DIAGNOSIS — R748 Abnormal levels of other serum enzymes: Secondary | ICD-10-CM

## 2023-02-25 LAB — COMPREHENSIVE METABOLIC PANEL
ALT: 60 U/L — ABNORMAL HIGH (ref 0–44)
AST: 73 U/L — ABNORMAL HIGH (ref 15–41)
Albumin: 2.6 g/dL — ABNORMAL LOW (ref 3.5–5.0)
Alkaline Phosphatase: 95 U/L (ref 38–126)
Anion gap: 19 — ABNORMAL HIGH (ref 5–15)
BUN: 62 mg/dL — ABNORMAL HIGH (ref 8–23)
CO2: 18 mmol/L — ABNORMAL LOW (ref 22–32)
Calcium: 8.4 mg/dL — ABNORMAL LOW (ref 8.9–10.3)
Chloride: 97 mmol/L — ABNORMAL LOW (ref 98–111)
Creatinine, Ser: 2.29 mg/dL — ABNORMAL HIGH (ref 0.61–1.24)
GFR, Estimated: 29 mL/min — ABNORMAL LOW (ref >60)
Glucose, Bld: 198 mg/dL — ABNORMAL HIGH (ref 70–99)
Potassium: 3.5 mmol/L (ref 3.5–5.1)
Sodium: 134 mmol/L — ABNORMAL LOW (ref 135–145)
Total Bilirubin: 2.7 mg/dL — ABNORMAL HIGH (ref 0.3–1.2)
Total Protein: 6 g/dL — ABNORMAL LOW (ref 6.5–8.1)

## 2023-02-25 LAB — URINALYSIS, ROUTINE W REFLEX MICROSCOPIC
Bacteria, UA: NONE SEEN
Bilirubin Urine: NEGATIVE
Glucose, UA: NEGATIVE mg/dL
Ketones, ur: 5 mg/dL — AB
Nitrite: NEGATIVE
Protein, ur: 30 mg/dL — AB
RBC / HPF: >50 RBC/hpf (ref 0–5)
Specific Gravity, Urine: 1.016 (ref 1.005–1.030)
pH: 5 (ref 5.0–8.0)

## 2023-02-25 LAB — GLUCOSE, CAPILLARY
Glucose-Capillary: 194 mg/dL — ABNORMAL HIGH (ref 70–99)
Glucose-Capillary: 188 mg/dL — ABNORMAL HIGH (ref 70–99)
Glucose-Capillary: 195 mg/dL — ABNORMAL HIGH (ref 70–99)
Glucose-Capillary: 207 mg/dL — ABNORMAL HIGH (ref 70–99)

## 2023-02-25 LAB — MAGNESIUM: Magnesium: 2 mg/dL (ref 1.7–2.4)

## 2023-02-25 LAB — CBC
HCT: 30.5 % — ABNORMAL LOW (ref 39.0–52.0)
Hemoglobin: 10.5 g/dL — ABNORMAL LOW (ref 13.0–17.0)
MCH: 28.3 pg (ref 26.0–34.0)
MCHC: 34.4 g/dL (ref 30.0–36.0)
MCV: 82.2 fL (ref 80.0–100.0)
Platelets: 146 10*3/uL — ABNORMAL LOW (ref 150–400)
RBC: 3.71 MIL/uL — ABNORMAL LOW (ref 4.22–5.81)
RDW: 16.8 % — ABNORMAL HIGH (ref 11.5–15.5)
WBC: 18 10*3/uL — ABNORMAL HIGH (ref 4.0–10.5)
nRBC: 0.1 % (ref 0.0–0.2)

## 2023-02-25 LAB — HEPARIN LEVEL (UNFRACTIONATED): Heparin Unfractionated: 0.31 [IU]/mL (ref 0.30–0.70)

## 2023-02-25 MED ORDER — ATORVASTATIN CALCIUM 80 MG PO TABS
80.0000 mg | ORAL_TABLET | Freq: Every day | ORAL | Status: DC
  Administered 2023-02-25 – 2023-03-13 (×16): 80 mg via ORAL
  Filled 2023-02-25 (×17): qty 1

## 2023-02-25 MED ORDER — METOPROLOL TARTRATE 25 MG PO TABS
25.0000 mg | ORAL_TABLET | Freq: Four times a day (QID) | ORAL | Status: DC
  Administered 2023-02-25 – 2023-02-28 (×12): 25 mg via ORAL
  Filled 2023-02-25 (×12): qty 1

## 2023-02-25 MED ORDER — HYDRALAZINE HCL 25 MG PO TABS
25.0000 mg | ORAL_TABLET | Freq: Three times a day (TID) | ORAL | Status: DC
  Administered 2023-02-25 – 2023-02-27 (×6): 25 mg via ORAL
  Filled 2023-02-25 (×8): qty 1

## 2023-02-25 MED ORDER — POTASSIUM CHLORIDE CRYS ER 20 MEQ PO TBCR
20.0000 meq | EXTENDED_RELEASE_TABLET | Freq: Once | ORAL | Status: AC
  Administered 2023-02-25: 20 meq via ORAL
  Filled 2023-02-25: qty 1

## 2023-02-25 NOTE — Progress Notes (Addendum)
ANTICOAGULATION CONSULT NOTE - Follow-up Consult  Pharmacy Consult for Heparin  Indication: chest pain/ACS  Allergies  Allergen Reactions   Shellfish-Derived Products Anaphylaxis   Zolpidem Tartrate Other (See Comments)    Hallucinations    Patient Measurements: Height: 5\' 7"  (170.2 cm) Weight: 68.7 kg (151 lb 7.3 oz) IBW/kg (Calculated) : 66.1 Heparin DW = 68 kg  Vital Signs: Temp: 97.6 F (36.4 C) (08/17 0756) Temp Source: Oral (08/17 0756) BP: 117/67 (08/17 0700) Pulse Rate: 82 (08/17 0700)  Labs: Recent Labs    02/23/23 0002 02/23/23 0536 02/23/23 1616 02/23/23 2335 02/24/23 0733 02/24/23 1611 02/24/23 1755 02/25/23 0151  HGB 9.8*  --   --  10.2*  --   --   --   --   HCT 29.9*  --   --  29.8*  --   --   --   --   PLT 138*  --   --  168  --   --   --   --   APTT  --   --  36  --   --   --   --   --   HEPARINUNFRC 0.23*   < > <0.10* 0.21*   < > 0.35 0.36 0.31  CREATININE 2.13*  --   --  1.99*  --   --   --   --    < > = values in this interval not displayed.    Estimated Creatinine Clearance: 29.1 mL/min (A) (by C-G formula based on SCr of 1.99 mg/dL (H)).   Assessment: 77 y/o M with chest pain at rest beginning 8/11, unrelieved by SL NTG, some help with morphine.  Unable to get LHC on 8/14 due to agitation, now awaiting improvement in renal function. No anticoagulation PTA, note potential LV thrombus but unable to be confirm due to inability to receive contrast. ECHO from today notes inability to exclude thrombus. Likely LHC on Monday.   Heparin level therapeutic at 0.31 on 1500 units/hr, but trending down. CBC remains stable. Patient is having a mild nose bleed today, will continue to monitor.   Goal of Therapy:  Heparin level 0.3-0.7 units/ml Monitor platelets by anticoagulation protocol: Yes   Plan:  Increase heparin to 1600u/hr.  Recheck confirmatory heparin level with AM labs.  Monitor daily CBC/Heparin levels while on UFH Monitor for signs of  bleeding  Thank you for allowing pharmacy to participate in this patient's care,  Wilmer Floor, PharmD PGY2 Cardiology Pharmacy Resident  Please check AMION for all [facility] Pharmacy phone numbers After 10:00 PM, call Main Pharmacy [phone]

## 2023-02-25 NOTE — Progress Notes (Signed)
Cardiology Progress Note  Patient ID: Nathan [name] MRN: [id number] DOB: 06-27-1946 Date of Encounter: 02/25/2023  Primary Cardiologist: Donato Schultz, MD  Subjective   Chief Complaint: None.   HPI: Reports no chest pain or trouble breathing.  Kidney function is stable.  White count elevated.  Does report cough.  Discussed infection workup.  ROS:  All other ROS reviewed and negative. Pertinent positives noted in the HPI.     Inpatient Medications  Scheduled Meds:  allopurinol  300 mg Oral Daily   vitamin C  1,000 mg Oral Daily   aspirin EC  81 mg Oral Daily   atorvastatin  20 mg Oral Daily   Chlorhexidine Gluconate Cloth  6 each Topical Daily   insulin aspart  0-15 Units Subcutaneous TID WC   isosorbide mononitrate  30 mg Oral Daily   metoprolol tartrate  25 mg Oral Q6H   mouth rinse  15 mL Mouth Rinse 4 times per day   Continuous Infusions:  sodium chloride Stopped (02/24/23 0824)   sodium chloride Stopped (02/21/23 1508)   heparin 1,600 Units/hr (02/25/23 0810)   PRN Meds: sodium chloride, acetaminophen, nitroGLYCERIN, mouth rinse, oxyCODONE-acetaminophen   Vital Signs   Vitals:   02/25/23 0600 02/25/23 0700 02/25/23 0756 02/25/23 0800  BP: 124/73 117/67  121/62  Pulse: 85 82  74  Resp: 19 (!) 24  16  Temp:   97.6 F (36.4 C)   TempSrc:   Oral   SpO2: (!) 89% 91%  95%  Weight:      Height:        Intake/Output Summary (Last 24 hours) at 02/25/2023 0913 Last data filed at 02/25/2023 0800 Gross per 24 hour  Intake 1684.78 ml  Output 890 ml  Net 794.78 ml      02/23/2023    6:00 AM 02/22/2023    6:00 AM 02/20/2023   10:17 PM  Last 3 Weights  Weight (lbs) 151 lb 7.3 oz 144 lb 13.5 oz 150 lb  Weight (kg) 68.7 kg 65.7 kg 68.04 kg      Telemetry  Overnight telemetry shows Afb 70s, which I personally reviewed.    Physical Exam   Vitals:   02/25/23 0600 02/25/23 0700 02/25/23 0756 02/25/23 0800  BP: 124/73 117/67  121/62  Pulse: 85 82  74  Resp: 19  (!) 24  16  Temp:   97.6 F (36.4 C)   TempSrc:   Oral   SpO2: (!) 89% 91%  95%  Weight:      Height:        Intake/Output Summary (Last 24 hours) at 02/25/2023 0913 Last data filed at 02/25/2023 0800 Gross per 24 hour  Intake 1684.78 ml  Output 890 ml  Net 794.78 ml       02/23/2023    6:00 AM 02/22/2023    6:00 AM 02/20/2023   10:17 PM  Last 3 Weights  Weight (lbs) 151 lb 7.3 oz 144 lb 13.5 oz 150 lb  Weight (kg) 68.7 kg 65.7 kg 68.04 kg    Body mass index is 23.72 kg/m.  General: Well nourished, well developed, in no acute distress Head: Atraumatic, normal size  Eyes: PEERLA, EOMI  Neck: Supple, no JVD Endocrine: No thryomegaly Cardiac: Normal S1, S2; irregular rhythm, no murmurs Lungs: Clear to auscultation bilaterally, no wheezing, rhonchi or rales  Abd: Soft, nontender, no hepatomegaly  Ext: No edema, pulses 2+ Musculoskeletal: No deformities, BUE and BLE strength normal and equal Skin: Warm and dry, no  rashes   Neuro: Alert and oriented to person, place, time, and situation, CNII-XII grossly intact, no focal deficits  Psych: Normal mood and affect   Labs  High Sensitivity Troponin:   Recent Labs  Lab 02/20/23 2120 02/20/23 2344 02/21/23 0624 02/21/23 1815 02/21/23 2110  TROPONINIHS 13 12 31* 4,527* 6,445*     Cardiac EnzymesNo results for input(s): "TROPONINI" in the last 168 hours. No results for input(s): "TROPIPOC" in the last 168 hours.  Chemistry Recent Labs  Lab 02/23/23 0002 02/23/23 2335 02/25/23 0749  NA 137 134* 134*  K 4.7 3.5 3.5  CL 103 99 97*  CO2 20* 21* 18*  GLUCOSE 178* 115* 198*  BUN 44* 50* 62*  CREATININE 2.13* 1.99* 2.29*  CALCIUM 8.6* 8.5* 8.4*  PROT 6.0* 5.8* 6.0*  ALBUMIN 2.9* 2.6* 2.6*  AST 95* 82* 73*  ALT 59* 56* 60*  ALKPHOS 68 75 95  BILITOT 2.6* 2.6* 2.7*  GFRNONAA 31* 34* 29*  ANIONGAP 14 14 19*    Hematology Recent Labs  Lab 02/23/23 0002 02/23/23 2335 02/25/23 0749  WBC 14.0* 17.6* 18.0*  RBC 3.41*  3.62* 3.71*  HGB 9.8* 10.2* 10.5*  HCT 29.9* 29.8* 30.5*  MCV 87.7 82.3 82.2  MCH 28.7 28.2 28.3  MCHC 32.8 34.2 34.4  RDW 17.3* 16.7* 16.8*  PLT 138* 168 146*   BNPNo results for input(s): "BNP", "PROBNP" in the last 168 hours.  DDimer No results for input(s): "DDIMER" in the last 168 hours.   Radiology  ECHOCARDIOGRAM LIMITED  Result Date: 02/24/2023    ECHOCARDIOGRAM LIMITED REPORT   Patient Name:   Nathan [name] Date of Exam: 02/24/2023 Medical Rec #:  [id number]      Height:       67.0 in Accession #:    [id number]     Weight:       151.5 lb Date of Birth:  11/23/1945     BSA:          1.797 m Patient Age:    76 years       BP:           109/82 mmHg Patient Gender: M              HR:           79 bpm. Exam Location:  Inpatient Procedure: Color Doppler, Cardiac Doppler and Limited Echo Indications:    Cardiomyopathy  History:        Patient has prior history of Echocardiogram examinations, most                 recent 02/22/2023. CAD, Prior CABG and Prior Cardiac Surgery,                 Aortic Valve Disease and 23mm Edwards bioprosthetic aortic valve                 in 2010, Signs/Symptoms:Shortness of Breath and Chest Pain; Risk                 Factors:Diabetes.  Sonographer:    Milbert Coulter Referring Phys: Nicki Gu[place], A IMPRESSIONS  1. Diffuse hypokinesis worse in the inferior, inferoseptal and apical walls; distal inferior akinesis. Cannot completely exlude mural thrombus at apex, recomm limited echo wit Definity to confirm . Left ventricular ejection fraction, by estimation, is 25 to 30%. The left ventricle has severely decreased function. Left ventricular diastolic parameters are indeterminate.  2. Right ventricular systolic function is normal. The  right ventricular size is normal.  3. Left atrial size was mildly dilated.  4. Mild mitral valve regurgitation. Moderate to severe mitral annular calcification.  5. S/p AVR (23 mm Edwards Bioprosthesis; procedure date 2010) Peak and mean  gradients through the valve are 14 and 7 mm respectively. . The aortic valve has been repaired/replaced. Aortic valve regurgitation is not visualized.  6. The inferior vena cava is dilated in size with <50% respiratory variability, suggesting right atrial pressure of 15 mmHg. FINDINGS  Left Ventricle: Diffuse hypokinesis worse in the inferior, inferoseptal and apical walls; distal inferior akinesis. Cannot completely exlude mural thrombus at apex, recomm limited echo wit Definity to confirm. Left ventricular ejection fraction, by estimation, is 25 to 30%. The left ventricle has severely decreased function. The left ventricular internal cavity size was normal in size. Left ventricular diastolic parameters are indeterminate. Right Ventricle: The right ventricular size is normal. Right ventricular systolic function is normal. Left Atrium: Left atrial size was mildly dilated. Right Atrium: Right atrial size was normal in size. Pericardium: There is no evidence of pericardial effusion. Mitral Valve: There is mild thickening of the mitral valve leaflet(s). Moderate to severe mitral annular calcification. Mild mitral valve regurgitation. Tricuspid Valve: The tricuspid valve is normal in structure. Tricuspid valve regurgitation is mild. Aortic Valve: S/p AVR (23 mm Edwards Bioprosthesis; procedure date 2010) Peak and mean gradients through the valve are 14 and 7 mm respectively. The aortic valve has been repaired/replaced. Aortic valve regurgitation is not visualized. Aortic valve mean gradient measures 7.5 mmHg. Aortic valve peak gradient measures 14.8 mmHg. Aortic valve area, by VTI measures 0.88 cm. Aorta: Aortic root could not be assessed. Venous: The inferior vena cava is dilated in size with less than 50% respiratory variability, suggesting right atrial pressure of 15 mmHg. IAS/Shunts: No atrial level shunt detected by color flow Doppler. Additional Comments: Spectral Doppler performed. Color Doppler performed.  LEFT  VENTRICLE PLAX 2D LVOT diam:     1.60 cm   Diastology LV SV:         27        LV e' medial:  3.73 cm/s LV SV Index:   15        LV e' lateral: 7.22 cm/s LVOT Area:     2.01 cm  RIGHT VENTRICLE RV S prime:     9.25 cm/s TAPSE (M-mode): 1.1 cm LEFT ATRIUM             Index        RIGHT ATRIUM           Index LA Vol (A2C):   71.5 ml 39.79 ml/m  RA Area:     16.10 cm LA Vol (A4C):   58.0 ml 32.28 ml/m  RA Volume:   37.00 ml  20.59 ml/m LA Biplane Vol: 66.1 ml 36.79 ml/m  AORTIC VALVE AV Area (Vmax):    0.83 cm AV Area (Vmean):   0.84 cm AV Area (VTI):     0.88 cm AV Vmax:           192.50 cm/s AV Vmean:          126.000 cm/s AV VTI:            0.302 m AV Peak Grad:      14.8 mmHg AV Mean Grad:      7.5 mmHg LVOT Vmax:         79.70 cm/s LVOT Vmean:        52.400  cm/s LVOT VTI:          0.132 m LVOT/AV VTI ratio: 0.44  SHUNTS Systemic VTI:  0.13 m Systemic Diam: 1.60 cm Dietrich Pates MD Electronically signed by Dietrich Pates MD Signature Date/Time: 02/24/2023/5:47:57 PM    Final     Cardiac Studies  TTE 02/22/2023  1. Only limited images obtained; pt refused to continue study after  initial apical images obtained; doppler of aortic valve not complete.   2. Left ventricular ejection fraction, by estimation, is 20 to 25%. The  left ventricle has severely decreased function. The left ventricle  demonstrates global hypokinesis. The left ventricular internal cavity size  was mildly dilated. There is mild left  ventricular hypertrophy. Left ventricular diastolic function could not be  evaluated.   3. Right ventricular systolic function is moderately reduced. The right  ventricular size is normal.   4. Left atrial size was moderately dilated.   5. The mitral valve is degenerative. Mild mitral valve regurgitation. No  evidence of mitral stenosis. Moderate mitral annular calcification.   6. The aortic valve has been repaired/replaced. Aortic valve  regurgitation is not visualized. There is a 23 mm Edwards  bioprosthetic  valve present in the aortic position. Procedure Date: 2010.   7. Aortic dilatation noted. There is mild dilatation of the ascending  aorta, measuring 40 mm.   Patient Profile  Nathan [name] is a 77 y.o. male with CAD status post CABG, aortic valve replacement, CKD, diabetes, hypertension, hyperlipidemia who was admitted on 02/21/2023 with non-STEMI and acute systolic heart failure.  Assessment & Plan   # Non-STEMI -Known history of CAD status post CABG.  Now here with non-STEMI and acute systolic heart failure.  Course complicated by volume overload.  This is the latest cardiac cath as well as AKI. -Tentative plan is for left heart catheterization on Monday. -Continue aspirin and statin and heparin drip. -Also on a beta-blocker.  TSH within limits.  A1c 6.1.  Lipids are at goal.  # Acute systolic heart failure, EF 20 to 25% -Appears euvolemic on exam.  No further IV diuresis. -No signs of shock.  He does have elevated serum creatinine and minimally elevated liver enzymes.  He is not hypotensive.  Does not appear to be in cardiogenic shock. -We will stop carvedilol.  Add metoprolol to tartrate 25 mg every 6 hours for better A-fib rate control. -No ACE/ARB/ARNI/MRA given AKI.  Hold SGLT2 inhibitor.  On Imdur 30 mg daily.  Add hydralazine 25 mg 3 times daily. -He will need right heart catheterization as well.  # Leukocytosis -Reports cough and congestion.  Repeat chest x-ray.  I have asked nursing staff to pull the Foley.  We will check blood cultures, UA and urine culture as well.  Hold antibiotics for now.  # Aortic valve replacement -Stable function of prosthesis on echo. -Continue aspirin.  # New onset A-fib -New this admission.  Was placed on amiodarone due to difficult control rates.  Stop amiodarone.  Transition to metoprolol to tartrate 25 mg every 6 hours.  No signs of shock.  Would prefer he not convert back to rhythm. -Continue heparin drip.  Will need to  transition to DOAC once no more planned procedures. -Likely will benefit from restoration of sinus rhythm at some point.  However cardiac cath needed and other procedures.  # AKI -Suspect cardiorenal.  Has been diuresed.  Hold diuresis today. -Plan is for left and right heart catheterization Monday.  # Elevated transaminase -Coming down.  Do not suspect cardiogenic shock.  # Hyperlipidemia -Continue statin  # Anemia -Stable  FEN -no IVF -code: full -dvt ppx: heparin drip -diet: heart healthy -disposition: out of ICU today. RHC/LHC pending    For questions or updates, please contact [facility] Please consult [email or web address] for contact info under        Signed, Gerri Spore T. Flora Lipps, MD, [facility]  [facility]  02/25/2023 9:13 AM

## 2023-02-26 DIAGNOSIS — N179 Acute kidney failure, unspecified: Secondary | ICD-10-CM | POA: Diagnosis not present

## 2023-02-26 DIAGNOSIS — I4891 Unspecified atrial fibrillation: Secondary | ICD-10-CM

## 2023-02-26 DIAGNOSIS — J189 Pneumonia, unspecified organism: Secondary | ICD-10-CM | POA: Diagnosis not present

## 2023-02-26 DIAGNOSIS — I214 Non-ST elevation (NSTEMI) myocardial infarction: Secondary | ICD-10-CM | POA: Diagnosis not present

## 2023-02-26 DIAGNOSIS — I5021 Acute systolic (congestive) heart failure: Secondary | ICD-10-CM | POA: Diagnosis not present

## 2023-02-26 LAB — CBC
HCT: 28.5 % — ABNORMAL LOW (ref 39.0–52.0)
Hemoglobin: 10.2 g/dL — ABNORMAL LOW (ref 13.0–17.0)
MCH: 28.3 pg (ref 26.0–34.0)
MCHC: 35.8 g/dL (ref 30.0–36.0)
MCV: 78.9 fL — ABNORMAL LOW (ref 80.0–100.0)
Platelets: 173 10*3/uL (ref 150–400)
RBC: 3.61 MIL/uL — ABNORMAL LOW (ref 4.22–5.81)
RDW: 16.5 % — ABNORMAL HIGH (ref 11.5–15.5)
WBC: 17 10*3/uL — ABNORMAL HIGH (ref 4.0–10.5)
nRBC: 0.1 % (ref 0.0–0.2)

## 2023-02-26 LAB — GLUCOSE, CAPILLARY
Glucose-Capillary: 174 mg/dL — ABNORMAL HIGH (ref 70–99)
Glucose-Capillary: 205 mg/dL — ABNORMAL HIGH (ref 70–99)
Glucose-Capillary: 138 mg/dL — ABNORMAL HIGH (ref 70–99)
Glucose-Capillary: 171 mg/dL — ABNORMAL HIGH (ref 70–99)

## 2023-02-26 LAB — HEPARIN LEVEL (UNFRACTIONATED)
Heparin Unfractionated: 0.7 [IU]/mL (ref 0.30–0.70)
Heparin Unfractionated: 0.77 [IU]/mL — ABNORMAL HIGH (ref 0.30–0.70)
Heparin Unfractionated: 0.89 [IU]/mL — ABNORMAL HIGH (ref 0.30–0.70)

## 2023-02-26 LAB — COMPREHENSIVE METABOLIC PANEL
ALT: 72 U/L — ABNORMAL HIGH (ref 0–44)
AST: 94 U/L — ABNORMAL HIGH (ref 15–41)
Albumin: 2.4 g/dL — ABNORMAL LOW (ref 3.5–5.0)
Alkaline Phosphatase: 90 U/L (ref 38–126)
Anion gap: 15 (ref 5–15)
BUN: 64 mg/dL — ABNORMAL HIGH (ref 8–23)
CO2: 19 mmol/L — ABNORMAL LOW (ref 22–32)
Calcium: 8.4 mg/dL — ABNORMAL LOW (ref 8.9–10.3)
Chloride: 98 mmol/L (ref 98–111)
Creatinine, Ser: 2.07 mg/dL — ABNORMAL HIGH (ref 0.61–1.24)
GFR, Estimated: 32 mL/min — ABNORMAL LOW (ref >60)
Glucose, Bld: 202 mg/dL — ABNORMAL HIGH (ref 70–99)
Potassium: 3.8 mmol/L (ref 3.5–5.1)
Sodium: 132 mmol/L — ABNORMAL LOW (ref 135–145)
Total Bilirubin: 2.1 mg/dL — ABNORMAL HIGH (ref 0.3–1.2)
Total Protein: 5.8 g/dL — ABNORMAL LOW (ref 6.5–8.1)

## 2023-02-26 MED ORDER — SENNOSIDES-DOCUSATE SODIUM 8.6-50 MG PO TABS
2.0000 | ORAL_TABLET | Freq: Two times a day (BID) | ORAL | Status: DC | PRN
  Administered 2023-02-26 – 2023-03-06 (×2): 2 via ORAL
  Filled 2023-02-26 (×2): qty 2

## 2023-02-26 MED ORDER — PIPERACILLIN-TAZOBACTAM 3.375 G IVPB
3.3750 g | Freq: Three times a day (TID) | INTRAVENOUS | Status: DC
  Administered 2023-02-26 – 2023-02-28 (×7): 3.375 g via INTRAVENOUS
  Filled 2023-02-26 (×9): qty 50

## 2023-02-26 NOTE — Progress Notes (Addendum)
ANTICOAGULATION CONSULT NOTE - Follow-up Consult  Pharmacy Consult for Heparin  Indication: chest pain/ACS  Allergies  Allergen Reactions   Shellfish-Derived Products Anaphylaxis   Zolpidem Tartrate Other (See Comments)    Hallucinations    Patient Measurements: Height: 5\' 7"  (170.2 cm) Weight: 68.7 kg (151 lb 7.3 oz) IBW/kg (Calculated) : 66.1 Heparin DW = 68 kg  Vital Signs: Temp: 97.4 F (36.3 C) (08/18 0748) Temp Source: Oral (08/18 0748) BP: 114/68 (08/18 0828) Pulse Rate: 84 (08/18 0828)  Labs: Recent Labs    02/23/23 1616 02/23/23 1616 02/23/23 2335 02/24/23 0733 02/25/23 0151 02/25/23 0749 02/26/23 0141 02/26/23 1123  HGB  --    < > 10.2*  --   --  10.5* 10.2*  --   HCT  --   --  29.8*  --   --  30.5* 28.5*  --   PLT  --   --  168  --   --  146* 173  --   APTT 36  --   --   --   --   --   --   --   HEPARINUNFRC <0.10*  --  0.21*   < > 0.31  --  0.70 0.77*  CREATININE  --   --  1.99*  --   --  2.29* 2.07*  --    < > = values in this interval not displayed.    Estimated Creatinine Clearance: 27.9 mL/min (A) (by C-G formula based on SCr of 2.07 mg/dL (H)).   Assessment: 77 y/o M with chest pain at rest beginning 8/11, unrelieved by SL NTG, some help with morphine.  Unable to get LHC on 8/14 due to agitation, now awaiting improvement in renal function. No anticoagulation PTA, note potential LV thrombus but unable to be confirm due to inability to receive contrast. ECHO from today notes inability to exclude thrombus. Likely LHC on Monday.   8/18 AM: Heparin level came back therapeutic but on higher end of goal range at 0.7, on 1600 units/hr. No s/sx of bleeding (nosebleed improved) and infusion issues.   8/18 1200: Heparin level 0.77, supra therapeutic on 1550 units/hr. Previously therapeutic with heparin infusion at 1500 units/hr. CBC stable (Hgb 10.2, plt 173).   Goal of Therapy:  Heparin level 0.3-0.7 units/ml Monitor platelets by anticoagulation  protocol: Yes   Plan:  Reduce heparin infusion to 1500 units/hr.  Recheck heparin level in 8 hours Monitor daily CBC/Heparin levels while on UFH Monitor for signs of bleeding  Thank you for allowing pharmacy to participate in this patient's care,  Enos Fling, PharmD PGY-1 Acute Care Pharmacy Resident 02/26/2023 12:13 PM

## 2023-02-26 NOTE — Plan of Care (Signed)
  Problem: Education: Goal: Understanding of cardiac disease, CV risk reduction, and recovery process will improve Outcome: Progressing Goal: Individualized Educational Video(s) Outcome: Progressing   Problem: Activity: Goal: Ability to tolerate increased activity will improve Outcome: Progressing   Problem: Cardiac: Goal: Ability to achieve and maintain adequate cardiovascular perfusion will improve Outcome: Progressing   Problem: Health Behavior/Discharge Planning: Goal: Ability to safely manage health-related needs after discharge will improve Outcome: Progressing   Problem: Education: Goal: Ability to describe self-care measures that may prevent or decrease complications (Diabetes Survival Skills Education) will improve Outcome: Progressing Goal: Individualized Educational Video(s) Outcome: Progressing   Problem: Fluid Volume: Goal: Ability to maintain a balanced intake and output will improve Outcome: Progressing   Problem: Health Behavior/Discharge Planning: Goal: Ability to identify and utilize available resources and services will improve Outcome: Progressing Goal: Ability to manage health-related needs will improve Outcome: Progressing   Problem: Metabolic: Goal: Ability to maintain appropriate glucose levels will improve Outcome: Progressing   Problem: Nutritional: Goal: Maintenance of adequate nutrition will improve Outcome: Progressing Goal: Progress toward achieving an optimal weight will improve Outcome: Progressing   Problem: Skin Integrity: Goal: Risk for impaired skin integrity will decrease Outcome: Progressing   Problem: Tissue Perfusion: Goal: Adequacy of tissue perfusion will improve Outcome: Progressing   Problem: Education: Goal: Understanding of CV disease, CV risk reduction, and recovery process will improve Outcome: Progressing Goal: Individualized Educational Video(s) Outcome: Progressing   Problem: Activity: Goal: Ability to  return to baseline activity level will improve Outcome: Progressing   Problem: Cardiovascular: Goal: Ability to achieve and maintain adequate cardiovascular perfusion will improve Outcome: Progressing

## 2023-02-26 NOTE — Progress Notes (Signed)
ANTICOAGULATION CONSULT NOTE - Follow-up Consult  Pharmacy Consult for Heparin  Indication: chest pain/ACS  Allergies  Allergen Reactions   Shellfish-Derived Products Anaphylaxis   Zolpidem Tartrate Other (See Comments)    Hallucinations    Patient Measurements: Height: 5\' 7"  (170.2 cm) Weight: 68.7 kg (151 lb 7.3 oz) IBW/kg (Calculated) : 66.1 Heparin DW = 68 kg  Vital Signs: Temp: 98.6 F (37 C) (08/18 1926) Temp Source: Oral (08/18 1926) BP: 120/70 (08/18 2114) Pulse Rate: 90 (08/18 2114)  Labs: Recent Labs    02/23/23 2335 02/24/23 0733 02/25/23 0749 02/26/23 0141 02/26/23 1123 02/26/23 2117  HGB 10.2*  --  10.5* 10.2*  --   --   HCT 29.8*  --  30.5* 28.5*  --   --   PLT 168  --  146* 173  --   --   HEPARINUNFRC 0.21*   < >  --  0.70 0.77* 0.89*  CREATININE 1.99*  --  2.29* 2.07*  --   --    < > = values in this interval not displayed.    Estimated Creatinine Clearance: 27.9 mL/min (A) (by C-G formula based on SCr of 2.07 mg/dL (H)).   Assessment: 77 y/o M with chest pain at rest beginning 8/11, unrelieved by SL NTG, some help with morphine.  Unable to get LHC on 8/14 due to agitation, now awaiting improvement in renal function. No anticoagulation PTA, note potential LV thrombus but unable to be confirm due to inability to receive contrast. ECHO from today notes inability to exclude thrombus. Likely LHC on Monday.   8/18 AM: Heparin level came back therapeutic but on higher end of goal range at 0.7, on 1600 units/hr. No s/sx of bleeding (nosebleed improved) and infusion issues.   Heparin level came back supratherapeutic again this PM. Collect correctly per Rn. We will decrease the rate and check in AM.   Goal of Therapy:  Heparin level 0.3-0.7 units/ml Monitor platelets by anticoagulation protocol: Yes   Plan:  Reduce heparin infusion to 1350 units/hr.  Recheck heparin level in 8 hours Monitor daily CBC/Heparin levels while on UFH Monitor for signs of  bleeding  Ulyses Southward, PharmD, BCIDP, AAHIVP, CPP Infectious Disease Pharmacist 02/26/2023 10:19 PM

## 2023-02-26 NOTE — Progress Notes (Signed)
ANTICOAGULATION CONSULT NOTE - Follow-up Consult  Pharmacy Consult for Heparin  Indication: chest pain/ACS  Allergies  Allergen Reactions   Shellfish-Derived Products Anaphylaxis   Zolpidem Tartrate Other (See Comments)    Hallucinations    Patient Measurements: Height: 5\' 7"  (170.2 cm) Weight: 68.7 kg (151 lb 7.3 oz) IBW/kg (Calculated) : 66.1 Heparin DW = 68 kg  Vital Signs: Temp: 98.1 F (36.7 C) (08/18 0333) Temp Source: Oral (08/18 0333) BP: 103/60 (08/18 0333) Pulse Rate: 72 (08/18 0333)  Labs: Recent Labs    02/23/23 1616 02/23/23 1616 02/23/23 2335 02/24/23 0733 02/24/23 1755 02/25/23 0151 02/25/23 0749 02/26/23 0141  HGB  --    < > 10.2*  --   --   --  10.5* 10.2*  HCT  --   --  29.8*  --   --   --  30.5* 28.5*  PLT  --   --  168  --   --   --  146* 173  APTT 36  --   --   --   --   --   --   --   HEPARINUNFRC <0.10*  --  0.21*   < > 0.36 0.31  --  0.70  CREATININE  --   --  1.99*  --   --   --  2.29* 2.07*   < > = values in this interval not displayed.    Estimated Creatinine Clearance: 27.9 mL/min (A) (by C-G formula based on SCr of 2.07 mg/dL (H)).   Assessment: 77 y/o M with chest pain at rest beginning 8/11, unrelieved by SL NTG, some help with morphine.  Unable to get LHC on 8/14 due to agitation, now awaiting improvement in renal function. No anticoagulation PTA, note potential LV thrombus but unable to be confirm due to inability to receive contrast. ECHO from today notes inability to exclude thrombus. Likely LHC on Monday.   Heparin level came back therapeutic but on higher end of goal range at 0.7, on 1600 units/hr. No s/sx of bleeding (nosebleed improved) and infusion issues.   Goal of Therapy:  Heparin level 0.3-0.7 units/ml Monitor platelets by anticoagulation protocol: Yes   Plan:  Reduce heparin infusion to 1550 units/hr.  Monitor daily CBC/Heparin levels while on UFH Monitor for signs of bleeding  Thank you for allowing pharmacy  to participate in this patient's care,  Sherron Monday, PharmD, BCCCP Clinical Pharmacist  Phone: [phone] 02/26/2023 3:49 AM  Please check AMION for all [facility] Pharmacy phone numbers After 10:00 PM, call Main Pharmacy [phone]

## 2023-02-26 NOTE — Progress Notes (Signed)
Pharmacy Antibiotic Note  Nathan [name] is a 77 y.o. male admitted on 02/20/2023 with hospital acquired pneumonia.  Pharmacy has been consulted for Zosyn dosing. Discussed antibiotic plans directly with MD. Patient was admitted on 8/12. Patient has persistent cough and congestion and chest x-ray concerning for pneumonia. WBC are elevated.   Plan: Zosyn 3.375g IV q8h (4 hour infusion). Monitor renal function, blood cultures, and clinical signs of improvement   Height: 5\' 7"  (170.2 cm) Weight: 68.7 kg (151 lb 7.3 oz) IBW/kg (Calculated) : 66.1  Temp (24hrs), Avg:98.3 F (36.8 C), Min:97.4 F (36.3 C), Max:99.4 F (37.4 C)  Recent Labs  Lab 02/22/23 0008 02/23/23 0002 02/23/23 2335 02/25/23 0749 02/26/23 0141  WBC 20.7* 14.0* 17.6* 18.0* 17.0*  CREATININE 1.86* 2.13* 1.99* 2.29* 2.07*    Estimated Creatinine Clearance: 27.9 mL/min (A) (by C-G formula based on SCr of 2.07 mg/dL (H)).    Allergies  Allergen Reactions   Shellfish-Derived Products Anaphylaxis   Zolpidem Tartrate Other (See Comments)    Hallucinations    Antimicrobials this admission: Zosyn 8/18 >> 8/24  Microbiology results: 8/17 BCx: no growth <24 hours 8/13 MRSA PCR: negative  Thank you for allowing pharmacy to be a part of this patient's care.  Enos Fling, PharmD PGY-1 Acute Care Pharmacy Resident 02/26/2023 10:00 AM

## 2023-02-26 NOTE — Progress Notes (Signed)
Cardiology Progress Note  Patient ID: Nathan [name] MRN: [id number] DOB: [birth year] Date of Encounter: 02/26/2023  Primary Cardiologist: Donato Schultz, MD  Subjective   Chief Complaint:   HPI: Chest x-ray concerning for pneumonia.  Continued cough and congestion.  Denies chest pain or trouble breathing.  Euvolemic on exam.  ROS:  All other ROS reviewed and negative. Pertinent positives noted in the HPI.     Inpatient Medications  Scheduled Meds:  allopurinol  300 mg Oral Daily   vitamin C  1,000 mg Oral Daily   aspirin EC  81 mg Oral Daily   atorvastatin  80 mg Oral Daily   hydrALAZINE  25 mg Oral Q8H   insulin aspart  0-15 Units Subcutaneous TID WC   isosorbide mononitrate  30 mg Oral Daily   metoprolol tartrate  25 mg Oral Q6H   Continuous Infusions:  sodium chloride Stopped (02/24/23 0824)   sodium chloride Stopped (02/21/23 1508)   heparin 1,550 Units/hr (02/26/23 0500)   PRN Meds: sodium chloride, acetaminophen, nitroGLYCERIN, mouth rinse, oxyCODONE-acetaminophen   Vital Signs   Vitals:   02/26/23 0333 02/26/23 0639 02/26/23 0748 02/26/23 0828  BP: 103/60 115/74  114/68  Pulse: 72  84 84  Resp: 20  14   Temp: 98.1 F (36.7 C)  (!) 97.4 F (36.3 C)   TempSrc: Oral  Oral   SpO2: 96%     Weight:      Height:        Intake/Output Summary (Last 24 hours) at 02/26/2023 0956 Last data filed at 02/26/2023 0945 Gross per 24 hour  Intake 363.19 ml  Output 615 ml  Net -251.81 ml      02/25/2023    6:42 PM 02/23/2023    6:00 AM 02/22/2023    6:00 AM  Last 3 Weights  Weight (lbs) 151 lb 7.3 oz 151 lb 7.3 oz 144 lb 13.5 oz  Weight (kg) 68.7 kg 68.7 kg 65.7 kg      Telemetry  Overnight telemetry shows A-fib heart rate 70s, which I personally reviewed.   Physical Exam   Vitals:   02/26/23 0333 02/26/23 0639 02/26/23 0748 02/26/23 0828  BP: 103/60 115/74  114/68  Pulse: 72  84 84  Resp: 20  14   Temp: 98.1 F (36.7 C)  (!) 97.4 F (36.3 C)   TempSrc:  Oral  Oral   SpO2: 96%     Weight:      Height:        Intake/Output Summary (Last 24 hours) at 02/26/2023 0956 Last data filed at 02/26/2023 0945 Gross per 24 hour  Intake 363.19 ml  Output 615 ml  Net -251.81 ml       02/25/2023    6:42 PM 02/23/2023    6:00 AM 02/22/2023    6:00 AM  Last 3 Weights  Weight (lbs) 151 lb 7.3 oz 151 lb 7.3 oz 144 lb 13.5 oz  Weight (kg) 68.7 kg 68.7 kg 65.7 kg    Body mass index is 23.72 kg/m.  General: Well nourished, well developed, in no acute distress Head: Atraumatic, normal size  Eyes: PEERLA, EOMI  Neck: Supple, no JVD Endocrine: No thryomegaly Cardiac: Normal S1, S2; irregular rhythm Lungs: Diminished breath sounds Abd: Soft, nontender, no hepatomegaly  Ext: No edema, pulses 2+ Musculoskeletal: No deformities, BUE and BLE strength normal and equal Skin: Warm and dry, no rashes   Neuro: Alert and oriented to person, place, time, and situation, CNII-XII grossly intact, no  focal deficits  Psych: Normal mood and affect   Labs  High Sensitivity Troponin:   Recent Labs  Lab 02/20/23 2120 02/20/23 2344 02/21/23 0624 02/21/23 1815 02/21/23 2110  TROPONINIHS 13 12 31* 4,527* 6,445*     Cardiac EnzymesNo results for input(s): "TROPONINI" in the last 168 hours. No results for input(s): "TROPIPOC" in the last 168 hours.  Chemistry Recent Labs  Lab 02/23/23 2335 02/25/23 0749 02/26/23 0141  NA 134* 134* 132*  K 3.5 3.5 3.8  CL 99 97* 98  CO2 21* 18* 19*  GLUCOSE 115* 198* 202*  BUN 50* 62* 64*  CREATININE 1.99* 2.29* 2.07*  CALCIUM 8.5* 8.4* 8.4*  PROT 5.8* 6.0* 5.8*  ALBUMIN 2.6* 2.6* 2.4*  AST 82* 73* 94*  ALT 56* 60* 72*  ALKPHOS 75 95 90  BILITOT 2.6* 2.7* 2.1*  GFRNONAA 34* 29* 32*  ANIONGAP 14 19* 15    Hematology Recent Labs  Lab 02/23/23 2335 02/25/23 0749 02/26/23 0141  WBC 17.6* 18.0* 17.0*  RBC 3.62* 3.71* 3.61*  HGB 10.2* 10.5* 10.2*  HCT 29.8* 30.5* 28.5*  MCV 82.3 82.2 78.9*  MCH 28.2 28.3 28.3   MCHC 34.2 34.4 35.8  RDW 16.7* 16.8* 16.5*  PLT 168 146* 173   BNPNo results for input(s): "BNP", "PROBNP" in the last 168 hours.  DDimer No results for input(s): "DDIMER" in the last 168 hours.   Radiology  DG CHEST PORT 1 VIEW  Result Date: 02/25/2023 CLINICAL DATA:  Chest pain, cough EXAM: PORTABLE CHEST 1 VIEW COMPARISON:  02/21/2023 FINDINGS: Mild bilateral interstitial thickening with more focal left lower lobe airspace disease. No pleural effusion or pneumothorax. Stable cardiomediastinal silhouette. Prior CABG. No acute osseous abnormality. IMPRESSION: 1. Mild bilateral interstitial thickening with more focal left lower lobe airspace disease concerning for mild pulmonary edema versus pneumonia. Electronically Signed   By: Elige Ko M.D.   On: 02/25/2023 13:14   ECHOCARDIOGRAM LIMITED  Result Date: 02/24/2023    ECHOCARDIOGRAM LIMITED REPORT   Patient Name:   Nathan [name] Date of Exam: 02/24/2023 Medical Rec #:  [id number]      Height:       67.0 in Accession #:    [id number]     Weight:       151.5 lb Date of Birth:  April 28, 1946     BSA:          1.797 m Patient Age:    76 years       BP:           109/82 mmHg Patient Gender: M              HR:           79 bpm. Exam Location:  Inpatient Procedure: Color Doppler, Cardiac Doppler and Limited Echo Indications:    Cardiomyopathy  History:        Patient has prior history of Echocardiogram examinations, most                 recent 02/22/2023. CAD, Prior CABG and Prior Cardiac Surgery,                 Aortic Valve Disease and 23mm Edwards bioprosthetic aortic valve                 in 2010, Signs/Symptoms:Shortness of Breath and Chest Pain; Risk                 Factors:Diabetes.  Sonographer:  Milbert Coulter Referring Phys: Nicki [place], A IMPRESSIONS  1. Diffuse hypokinesis worse in the inferior, inferoseptal and apical walls; distal inferior akinesis. Cannot completely exlude mural thrombus at apex, recomm limited echo wit Definity to confirm  . Left ventricular ejection fraction, by estimation, is 25 to 30%. The left ventricle has severely decreased function. Left ventricular diastolic parameters are indeterminate.  2. Right ventricular systolic function is normal. The right ventricular size is normal.  3. Left atrial size was mildly dilated.  4. Mild mitral valve regurgitation. Moderate to severe mitral annular calcification.  5. S/p AVR (23 mm Edwards Bioprosthesis; procedure date 2010) Peak and mean gradients through the valve are 14 and 7 mm respectively. . The aortic valve has been repaired/replaced. Aortic valve regurgitation is not visualized.  6. The inferior vena cava is dilated in size with <50% respiratory variability, suggesting right atrial pressure of 15 mmHg. FINDINGS  Left Ventricle: Diffuse hypokinesis worse in the inferior, inferoseptal and apical walls; distal inferior akinesis. Cannot completely exlude mural thrombus at apex, recomm limited echo wit Definity to confirm. Left ventricular ejection fraction, by estimation, is 25 to 30%. The left ventricle has severely decreased function. The left ventricular internal cavity size was normal in size. Left ventricular diastolic parameters are indeterminate. Right Ventricle: The right ventricular size is normal. Right ventricular systolic function is normal. Left Atrium: Left atrial size was mildly dilated. Right Atrium: Right atrial size was normal in size. Pericardium: There is no evidence of pericardial effusion. Mitral Valve: There is mild thickening of the mitral valve leaflet(s). Moderate to severe mitral annular calcification. Mild mitral valve regurgitation. Tricuspid Valve: The tricuspid valve is normal in structure. Tricuspid valve regurgitation is mild. Aortic Valve: S/p AVR (23 mm Edwards Bioprosthesis; procedure date 2010) Peak and mean gradients through the valve are 14 and 7 mm respectively. The aortic valve has been repaired/replaced. Aortic valve regurgitation is not  visualized. Aortic valve mean gradient measures 7.5 mmHg. Aortic valve peak gradient measures 14.8 mmHg. Aortic valve area, by VTI measures 0.88 cm. Aorta: Aortic root could not be assessed. Venous: The inferior vena cava is dilated in size with less than 50% respiratory variability, suggesting right atrial pressure of 15 mmHg. IAS/Shunts: No atrial level shunt detected by color flow Doppler. Additional Comments: Spectral Doppler performed. Color Doppler performed.  LEFT VENTRICLE PLAX 2D LVOT diam:     1.60 cm   Diastology LV SV:         27        LV e' medial:  3.73 cm/s LV SV Index:   15        LV e' lateral: 7.22 cm/s LVOT Area:     2.01 cm  RIGHT VENTRICLE RV S prime:     9.25 cm/s TAPSE (M-mode): 1.1 cm LEFT ATRIUM             Index        RIGHT ATRIUM           Index LA Vol (A2C):   71.5 ml 39.79 ml/m  RA Area:     16.10 cm LA Vol (A4C):   58.0 ml 32.28 ml/m  RA Volume:   37.00 ml  20.59 ml/m LA Biplane Vol: 66.1 ml 36.79 ml/m  AORTIC VALVE AV Area (Vmax):    0.83 cm AV Area (Vmean):   0.84 cm AV Area (VTI):     0.88 cm AV Vmax:           192.50 cm/s  AV Vmean:          126.000 cm/s AV VTI:            0.302 m AV Peak Grad:      14.8 mmHg AV Mean Grad:      7.5 mmHg LVOT Vmax:         79.70 cm/s LVOT Vmean:        52.400 cm/s LVOT VTI:          0.132 m LVOT/AV VTI ratio: 0.44  SHUNTS Systemic VTI:  0.13 m Systemic Diam: 1.60 cm Dietrich Pates MD Electronically signed by Dietrich Pates MD Signature Date/Time: 02/24/2023/5:47:57 PM    Final     Cardiac Studies  TTE 02/24/2023  1. Diffuse hypokinesis worse in the inferior, inferoseptal and apical  walls; distal inferior akinesis. Cannot completely exlude mural thrombus  at apex, recomm limited echo wit Definity to confirm . Left ventricular  ejection fraction, by estimation, is  25 to 30%. The left ventricle has severely decreased function. Left  ventricular diastolic parameters are indeterminate.   2. Right ventricular systolic function is normal. The  right ventricular  size is normal.   3. Left atrial size was mildly dilated.   4. Mild mitral valve regurgitation. Moderate to severe mitral annular  calcification.   5. S/p AVR (23 mm Edwards Bioprosthesis; procedure date 2010) Peak and  mean gradients through the valve are 14 and 7 mm respectively. . The  aortic valve has been repaired/replaced. Aortic valve regurgitation is not  visualized.   6. The inferior vena cava is dilated in size with <50% respiratory  variability, suggesting right atrial pressure of 15 mmHg.   Patient Profile  Nathan [name] is a 77 y.o. male with CAD status post CABG, aortic valve replacement, CKD, diabetes, hypertension, hyperlipidemia who was admitted on 02/21/2023 with non-STEMI and acute systolic heart failure.   Assessment & Plan   # NSTEMI -History of CABG.  Now here with non-STEMI and acute systolic heart failure. - Course complicated by acute heart failure and delirium.  This delayed his cardiac cath.  Overall he is much improved.  Also had hypoxic respiratory failure in the setting of worsening heart failure.  Now out of ICU.  Doing well. -Continue aspirin statin and heparin drip.  N.p.o. for left heart catheterization tomorrow.  Continue beta-blocker.  # Acute systolic heart failure, EF 20-25% -Euvolemic on exam.  No signs of shock. -On metoprolol tartrate 25 mg every 6 hours for A-fib.  Transition to metoprolol succinate closer to discharge. -Continue Imdur 30 mg daily and hydralazine 25 mg 3 times daily.  No ACE/ARB/ARNI/MRA given CKD. -Will need right heart catheterization as well.  Does not appear volume up.  Hold further diuresis.  # New onset A-fib -New this admission.  Secondary to heart failure.  On heparin drip.  On metoprolol tartrate 25 mg every 6 hours.  Transition to DOAC after cath.  Would benefit from restoration of sinus rhythm at some point.  # Leukocytosis #Cough #Pneumonia -We will go ahead and treat him for pneumonia.  He has  cough and elevated white count.  Also with opacifications concerning for pneumonia on chest x-ray.  Discussed with pharmacy.  They will treated for HCAP. -Blood cultures are negative [place].  UA unremarkable.  # Status post aortic valve replacement -Stable on echo.  Continue aspirin.  # AKI -Likely due to cardiorenal.  Creatinine coming down.  Baseline 1.2-1.4.  Holding further diuresis.  # Elevated liver  enzymes -Secondary to heart failure.  Improving.  # Anemia -Stable  FEN -No intravenous fluids -Code: Full -DVT PPx heparin drip -Diet: Heart healthy -Disposition: Pending left heart catheterization tomorrow.  Also being treated for pneumonia.  For questions or updates, please contact [facility] Please consult [email or web address] for contact info under        Signed, Gerri Spore T. Flora Lipps, MD, [facility]  [facility]  02/26/2023 9:56 AM

## 2023-02-26 NOTE — Plan of Care (Signed)
Problem: Education: Goal: Understanding of cardiac disease, CV risk reduction, and recovery process will improve 02/26/2023 0820 by Barnabas Lister, RN Outcome: Progressing 02/26/2023 0820 by Barnabas Lister, RN Outcome: Progressing Goal: Individualized Educational Video(s) 02/26/2023 0820 by Barnabas Lister, RN Outcome: Progressing 02/26/2023 0820 by Barnabas Lister, RN Outcome: Progressing   Problem: Activity: Goal: Ability to tolerate increased activity will improve 02/26/2023 0820 by Barnabas Lister, RN Outcome: Progressing 02/26/2023 0820 by Barnabas Lister, RN Outcome: Progressing   Problem: Cardiac: Goal: Ability to achieve and maintain adequate cardiovascular perfusion will improve 02/26/2023 0820 by Barnabas Lister, RN Outcome: Progressing 02/26/2023 0820 by Barnabas Lister, RN Outcome: Progressing   Problem: Health Behavior/Discharge Planning: Goal: Ability to safely manage health-related needs after discharge will improve 02/26/2023 0820 by Barnabas Lister, RN Outcome: Progressing 02/26/2023 0820 by Barnabas Lister, RN Outcome: Progressing   Problem: Education: Goal: Ability to describe self-care measures that may prevent or decrease complications (Diabetes Survival Skills Education) will improve 02/26/2023 0820 by Barnabas Lister, RN Outcome: Progressing 02/26/2023 0820 by Barnabas Lister, RN Outcome: Progressing Goal: Individualized Educational Video(s) 02/26/2023 0820 by Barnabas Lister, RN Outcome: Progressing 02/26/2023 0820 by Barnabas Lister, RN Outcome: Progressing   Problem: Coping: Goal: Ability to adjust to condition or change in health will improve Outcome: Progressing   Problem: Fluid Volume: Goal: Ability to maintain a balanced intake and output will improve 02/26/2023 0820 by Barnabas Lister, RN Outcome: Progressing 02/26/2023 0820 by Barnabas Lister, RN Outcome: Progressing   Problem: Health Behavior/Discharge  Planning: Goal: Ability to identify and utilize available resources and services will improve 02/26/2023 0820 by Barnabas Lister, RN Outcome: Progressing 02/26/2023 0820 by Barnabas Lister, RN Outcome: Progressing Goal: Ability to manage health-related needs will improve Outcome: Progressing   Problem: Metabolic: Goal: Ability to maintain appropriate glucose levels will improve 02/26/2023 0820 by Barnabas Lister, RN Outcome: Progressing 02/26/2023 0820 by Barnabas Lister, RN Outcome: Progressing   Problem: Nutritional: Goal: Maintenance of adequate nutrition will improve 02/26/2023 0820 by Barnabas Lister, RN Outcome: Progressing 02/26/2023 0820 by Barnabas Lister, RN Outcome: Progressing Goal: Progress toward achieving an optimal weight will improve 02/26/2023 0820 by Barnabas Lister, RN Outcome: Progressing 02/26/2023 0820 by Barnabas Lister, RN Outcome: Progressing   Problem: Skin Integrity: Goal: Risk for impaired skin integrity will decrease 02/26/2023 0820 by Barnabas Lister, RN Outcome: Progressing 02/26/2023 0820 by Barnabas Lister, RN Outcome: Progressing   Problem: Tissue Perfusion: Goal: Adequacy of tissue perfusion will improve 02/26/2023 0820 by Barnabas Lister, RN Outcome: Progressing 02/26/2023 0820 by Barnabas Lister, RN Outcome: Progressing   Problem: Education: Goal: Understanding of CV disease, CV risk reduction, and recovery process will improve 02/26/2023 0820 by Barnabas Lister, RN Outcome: Progressing 02/26/2023 0820 by Barnabas Lister, RN Outcome: Progressing Goal: Individualized Educational Video(s) 02/26/2023 0820 by Barnabas Lister, RN Outcome: Progressing 02/26/2023 0820 by Barnabas Lister, RN Outcome: Progressing   Problem: Activity: Goal: Ability to return to baseline activity level will improve 02/26/2023 0820 by Barnabas Lister, RN Outcome: Progressing 02/26/2023 0820 by Barnabas Lister, RN Outcome:  Progressing   Problem: Cardiovascular: Goal: Ability to achieve and maintain adequate cardiovascular perfusion will improve 02/26/2023 0820 by Barnabas Lister, RN Outcome: Progressing 02/26/2023 0820 by Barnabas Lister, RN Outcome: Progressing Goal: Vascular access site(s) Level 0-1 will be maintained Outcome: Progressing   Problem: Health  Behavior/Discharge Planning: Goal: Ability to safely manage health-related needs after discharge will improve Outcome: Progressing   Problem: Education: Goal: Knowledge of General Education information will improve Description: Including pain rating scale, medication(s)/side effects and non-pharmacologic comfort measures Outcome: Progressing   Problem: Health Behavior/Discharge Planning: Goal: Ability to manage health-related needs will improve Outcome: Progressing   Problem: Clinical Measurements: Goal: Ability to maintain clinical measurements within normal limits will improve Outcome: Progressing Goal: Will remain free from infection Outcome: Progressing Goal: Diagnostic test results will improve Outcome: Progressing Goal: Cardiovascular complication will be avoided Outcome: Progressing   Problem: Activity: Goal: Risk for activity intolerance will decrease Outcome: Progressing   Problem: Nutrition: Goal: Adequate nutrition will be maintained Outcome: Progressing   Problem: Coping: Goal: Level of anxiety will decrease Outcome: Progressing   Problem: Elimination: Goal: Will not experience complications related to bowel motility Outcome: Progressing Goal: Will not experience complications related to urinary retention Outcome: Progressing   Problem: Pain Managment: Goal: General experience of comfort will improve Outcome: Progressing   Problem: Safety: Goal: Ability to remain free from injury will improve Outcome: Progressing   Problem: Skin Integrity: Goal: Risk for impaired skin integrity will decrease Outcome:  Progressing

## 2023-02-26 NOTE — Progress Notes (Signed)
At 1528 patient had 13 beat run ov v-tach. Patient comfortably resting in recliner and conversing with spouse. Patient assessed and found to be asymptomatic.BP of 111/69, still in A-fib with a HR of 85 at this time. Lisabeth Devoid, [place] notified via Page.

## 2023-02-27 ENCOUNTER — Encounter (HOSPITAL_COMMUNITY): Payer: Self-pay | Admitting: Cardiovascular Disease

## 2023-02-27 DIAGNOSIS — I249 Acute ischemic heart disease, unspecified: Secondary | ICD-10-CM

## 2023-02-27 LAB — COMPREHENSIVE METABOLIC PANEL
ALT: 63 U/L — ABNORMAL HIGH (ref 0–44)
AST: 67 U/L — ABNORMAL HIGH (ref 15–41)
Albumin: 2.3 g/dL — ABNORMAL LOW (ref 3.5–5.0)
Alkaline Phosphatase: 79 U/L (ref 38–126)
Anion gap: 19 — ABNORMAL HIGH (ref 5–15)
BUN: 54 mg/dL — ABNORMAL HIGH (ref 8–23)
CO2: 18 mmol/L — ABNORMAL LOW (ref 22–32)
Calcium: 8.4 mg/dL — ABNORMAL LOW (ref 8.9–10.3)
Chloride: 98 mmol/L (ref 98–111)
Creatinine, Ser: 2.01 mg/dL — ABNORMAL HIGH (ref 0.61–1.24)
GFR, Estimated: 34 mL/min — ABNORMAL LOW (ref >60)
Glucose, Bld: 182 mg/dL — ABNORMAL HIGH (ref 70–99)
Potassium: 3.3 mmol/L — ABNORMAL LOW (ref 3.5–5.1)
Sodium: 135 mmol/L (ref 135–145)
Total Bilirubin: 2.5 mg/dL — ABNORMAL HIGH (ref 0.3–1.2)
Total Protein: 6 g/dL — ABNORMAL LOW (ref 6.5–8.1)

## 2023-02-27 LAB — PROCALCITONIN: Procalcitonin: 2.03 ng/mL

## 2023-02-27 LAB — CBC
HCT: 27.5 % — ABNORMAL LOW (ref 39.0–52.0)
Hemoglobin: 9.8 g/dL — ABNORMAL LOW (ref 13.0–17.0)
MCH: 28.2 pg (ref 26.0–34.0)
MCHC: 35.6 g/dL (ref 30.0–36.0)
MCV: 79 fL — ABNORMAL LOW (ref 80.0–100.0)
Platelets: 167 10*3/uL (ref 150–400)
RBC: 3.48 MIL/uL — ABNORMAL LOW (ref 4.22–5.81)
RDW: 16.8 % — ABNORMAL HIGH (ref 11.5–15.5)
WBC: 17.9 10*3/uL — ABNORMAL HIGH (ref 4.0–10.5)
nRBC: 0.2 % (ref 0.0–0.2)

## 2023-02-27 LAB — HEPARIN LEVEL (UNFRACTIONATED)
Heparin Unfractionated: 0.7 [IU]/mL (ref 0.30–0.70)
Heparin Unfractionated: 0.59 [IU]/mL (ref 0.30–0.70)

## 2023-02-27 LAB — GLUCOSE, CAPILLARY
Glucose-Capillary: 191 mg/dL — ABNORMAL HIGH (ref 70–99)
Glucose-Capillary: 206 mg/dL — ABNORMAL HIGH (ref 70–99)
Glucose-Capillary: 155 mg/dL — ABNORMAL HIGH (ref 70–99)
Glucose-Capillary: 168 mg/dL — ABNORMAL HIGH (ref 70–99)

## 2023-02-27 MED ORDER — SODIUM CHLORIDE 0.9 % IV SOLN: INTRAVENOUS | Status: AC

## 2023-02-27 NOTE — Progress Notes (Signed)
ANTICOAGULATION CONSULT NOTE  Pharmacy Consult for Heparin  Indication: chest pain/ACS  Allergies  Allergen Reactions   Shellfish-Derived Products Anaphylaxis   Zolpidem Tartrate Other (See Comments)    Hallucinations    Patient Measurements: Height: 5\' 7"  (170.2 cm) Weight: 68.7 kg (151 lb 7.3 oz) IBW/kg (Calculated) : 66.1 Heparin DW = 68 kg  Vital Signs: Temp: 97.8 F (36.6 C) (08/19 1614) Temp Source: Oral (08/19 1614) BP: 110/56 (08/19 1614) Pulse Rate: 79 (08/19 1614)  Labs: Recent Labs    02/25/23 0749 02/26/23 0141 02/26/23 1123 02/26/23 2117 02/27/23 0727 02/27/23 1557  HGB 10.5* 10.2*  --   --  9.8*  --   HCT 30.5* 28.5*  --   --  27.5*  --   PLT 146* 173  --   --  167  --   HEPARINUNFRC  --  0.70   < > 0.89* 0.70 0.59  CREATININE 2.29* 2.07*  --   --  2.01*  --    < > = values in this interval not displayed.    Estimated Creatinine Clearance: 28.8 mL/min (A) (by C-G formula based on SCr of 2.01 mg/dL (H)).   Assessment: 77 y/o M with chest pain at rest beginning 8/11, unrelieved by SL NTG, some help with morphine.  Unable to get LHC on 8/14 due to agitation, now awaiting improvement in renal function.  No anticoagulation PTA, note potential LV thrombus but unable to be confirm due to inability to receive contrast.  8/16 ECHO noted inability to exclude thrombus.   Heparin level 0.59 therapeutic on 1350 units/hr.  No cath today w/ renal dysfunction.  No s/sx of bleeding (nosebleed improved, per RN no further episodes for several hours) or infusion issues.   Goal of Therapy:  Heparin level 0.3-0.7 units/ml Monitor platelets by anticoagulation protocol: Yes   Plan:  Continue heparin infusion at 1350 units/hr Check heparin level daily while on heparin Continue to monitor H&H and platelets  Thank you for allowing pharmacy to be a part of this patient's care.  Thelma Barge, PharmD Clinical Pharmacist

## 2023-02-27 NOTE — Progress Notes (Signed)
IV consult received to start a 2nd IV for incompatible meds. Pt meds are compatible and can be given via same IV. Primary RN states pt will need a 2nd site due to cardiac cath to be done around 12pm today. RN to place consult closer to time of procedure due to pt being confused and pulling out previous IV.

## 2023-02-27 NOTE — Progress Notes (Signed)
Physical Therapy Treatment Patient Details Name: Nathan [name] MRN: [id number] DOB: 12-05-1945 Today's Date: 02/27/2023   History of Present Illness Pt is 77 yo male presented on 02/20/23 with chest pain and found to have NSTEMI.  Pt was scheduled to undergo cardiac cath but developed overload/CHF requiring transfer to ICU. Pt with hx including but not limited to CAD s/p CABGx3 in 2010, aortic valve replacement, DM, HTN, HLD, MI, OSA    PT Comments  Pt admitted with above diagnosis. Pt was able to ambulate with RW with overall good stability . Wife present and can assist pt at home and was educated as such.  Recommend HHPT as pt would benefit from assessment of home set up and continue to work on pt balance and endurance.  Pt currently with functional limitations due to the deficits listed below (see PT Problem List). Pt will benefit from acute skilled PT to increase their independence and safety with mobility to allow discharge.       If plan is discharge home, recommend the following: A little help with walking and/or transfers;A little help with bathing/dressing/bathroom;Assistance with cooking/housework;Help with stairs or ramp for entrance   Can travel by private vehicle        Equipment Recommendations  Rolling walker (2 wheels);Other (comment) (issued gait belt)    Recommendations for Other Services       Precautions / Restrictions Precautions Precautions: Fall Restrictions Weight Bearing Restrictions: No     Mobility  Bed Mobility Overal bed mobility: Needs Assistance Bed Mobility: Supine to Sit     Supine to sit: Contact guard     General bed mobility comments: No assist needed just incr time to come to eOB.  Wife present.    Transfers Overall transfer level: Needs assistance Equipment used: Rolling walker (2 wheels) Transfers: Sit to/from Stand Sit to Stand: Contact guard assist           General transfer comment: Cues for safety     Ambulation/Gait Ambulation/Gait assistance: Min assist Gait Distance (Feet): 150 Feet Assistive device: Rolling walker (2 wheels) Gait Pattern/deviations: Step-through pattern, Decreased stride length Gait velocity: decreased Gait velocity interpretation: 1.31 - 2.62 ft/sec, indicative of limited community ambulator   General Gait Details: Overall CGA to ambulate with RW but had 2 episodes requiring min A when he fatigued. Wife wanted pt to try to walk without RW and did so however pt with more LOB without RW.  Wife agrees pt is safer with RW and states she will arrange house accodingly to accomodate for RW.   Stairs             Wheelchair Mobility     Tilt Bed    Modified Rankin (Stroke Patients Only)       Balance Overall balance assessment: Needs assistance Sitting-balance support: No upper extremity supported Sitting balance-Leahy Scale: Good     Standing balance support: Bilateral upper extremity supported, Reliant on assistive device for balance Standing balance-Leahy Scale: Poor Standing balance comment: Requring RW and +1 assist                            Cognition Arousal: Alert Behavior During Therapy: Flat affect Overall Cognitive Status: No family/caregiver present to determine baseline cognitive functioning Area of Impairment: Problem solving                             Problem Solving:  Slow processing          Exercises      General Comments General comments (skin integrity, edema, etc.): VSS      Pertinent Vitals/Pain Pain Assessment Pain Assessment: Faces Faces Pain Scale: Hurts a little bit Pain Location: Bil LE Pain Descriptors / Indicators: Discomfort, Tightness Pain Intervention(s): Limited activity within patient's tolerance, Monitored during session, Repositioned    Home Living                          Prior Function            PT Goals (current goals can now be found in the care plan  section) Acute Rehab PT Goals Patient Stated Goal: return home Progress towards PT goals: Progressing toward goals    Frequency    Min 1X/week      PT Plan      Co-evaluation              AM-PAC PT "6 Clicks" Mobility   Outcome Measure  Help needed turning from your back to your side while in a flat bed without using bedrails?: A Little Help needed moving from lying on your back to sitting on the side of a flat bed without using bedrails?: A Little Help needed moving to and from a bed to a chair (including a wheelchair)?: A Little Help needed standing up from a chair using your arms (e.g., wheelchair or bedside chair)?: A Little Help needed to walk in hospital room?: A Little Help needed climbing 3-5 steps with a railing? : A Lot 6 Click Score: 17    End of Session Equipment Utilized During Treatment: Gait belt Activity Tolerance: Patient limited by fatigue Patient left: with call bell/phone within reach;with family/visitor present (left pt on toilet with wife in room and they will use call bell when pt finished) Nurse Communication: Mobility status PT Visit Diagnosis: Other abnormalities of gait and mobility (R26.89);Muscle weakness (generalized) (M62.81)     Time: 4098-1191 PT Time Calculation (min) (ACUTE ONLY): 19 min  Charges:    $Gait Training: 8-22 mins PT General Charges $$ ACUTE PT VISIT: 1 Visit                     Ridhima Golberg M,PT Acute Rehab Services [phone]    Bevelyn Buckles 02/27/2023, 2:01 PM

## 2023-02-27 NOTE — Progress Notes (Signed)
It confused-oriented to self and year only. Pt attempting to get OOB several times without assist and becoming somewhat combative each time we re-enter room. He is trying to leave hospital-unable to reorient at this time. Pts wife Gavin Pound called and informed, wife to come stay with patient.

## 2023-02-27 NOTE — Progress Notes (Addendum)
Patient complained of dysuria. Bladder scan read 298 mL.  Patient urinated 200 mL of light brown tinged urine. This is a change from the clear yellow tinged urine that he voids.  Patients states he can not remember if he has any history with prostate, bladder, or kidney issues.

## 2023-02-27 NOTE — Progress Notes (Signed)
Mobility Specialist Progress Note:    02/27/23 1156  Mobility  Activity Ambulated with assistance in hallway  Level of Assistance Minimal assist, patient does 75% or more  Assistive Device Front wheel walker  Distance Ambulated (ft) 400 ft  Activity Response Tolerated well  Mobility Referral Yes  $Mobility charge 1 Mobility  Mobility Specialist Start Time (ACUTE ONLY) 1035  Mobility Specialist Stop Time (ACUTE ONLY) 1050  Mobility Specialist Time Calculation (min) (ACUTE ONLY) 15 min   Pt received in bed, agreeable to ambulate. Needed minor verbal cues to stay focused throughout session. MinA needed for STS and contact guard during ambulation. No c/o throughout session. Assisted back to bed w/ call bell nad personal belongings at reach. All needs met and bed alarm is on.  Thompson Grayer Mobility Specialist  Please contact vis Secure Chat or  Rehab Office [phone]

## 2023-02-27 NOTE — Progress Notes (Addendum)
Over the last two night Patient has been increasingly confused, hallucinating and unable to express thoughts. Patient removed IV, heart monitor and clothes saying "we are surrounded by police. Look!". Subsequently, safety mitten were placed on both hands. Patient has been educated about the purpose of the mittens.

## 2023-02-27 NOTE — Plan of Care (Signed)
  Problem: Education: Goal: Understanding of cardiac disease, CV risk reduction, and recovery process will improve Outcome: Progressing Goal: Individualized Educational Video(s) Outcome: Progressing   Problem: Activity: Goal: Ability to tolerate increased activity will improve Outcome: Progressing   Problem: Cardiac: Goal: Ability to achieve and maintain adequate cardiovascular perfusion will improve Outcome: Progressing   Problem: Health Behavior/Discharge Planning: Goal: Ability to safely manage health-related needs after discharge will improve Outcome: Progressing   Problem: Education: Goal: Ability to describe self-care measures that may prevent or decrease complications (Diabetes Survival Skills Education) will improve Outcome: Progressing Goal: Individualized Educational Video(s) Outcome: Progressing   Problem: Coping: Goal: Ability to adjust to condition or change in health will improve Outcome: Progressing   Problem: Fluid Volume: Goal: Ability to maintain a balanced intake and output will improve Outcome: Progressing   Problem: Health Behavior/Discharge Planning: Goal: Ability to identify and utilize available resources and services will improve Outcome: Progressing Goal: Ability to manage health-related needs will improve Outcome: Progressing   Problem: Metabolic: Goal: Ability to maintain appropriate glucose levels will improve Outcome: Progressing   Problem: Nutritional: Goal: Maintenance of adequate nutrition will improve Outcome: Progressing Goal: Progress toward achieving an optimal weight will improve Outcome: Progressing   Problem: Skin Integrity: Goal: Risk for impaired skin integrity will decrease Outcome: Progressing   Problem: Tissue Perfusion: Goal: Adequacy of tissue perfusion will improve Outcome: Progressing   Problem: Education: Goal: Understanding of CV disease, CV risk reduction, and recovery process will improve Outcome:  Progressing Goal: Individualized Educational Video(s) Outcome: Progressing   Problem: Activity: Goal: Ability to return to baseline activity level will improve Outcome: Progressing   Problem: Cardiovascular: Goal: Ability to achieve and maintain adequate cardiovascular perfusion will improve Outcome: Progressing Goal: Vascular access site(s) Level 0-1 will be maintained Outcome: Progressing   Problem: Health Behavior/Discharge Planning: Goal: Ability to safely manage health-related needs after discharge will improve Outcome: Progressing   Problem: Education: Goal: Knowledge of General Education information will improve Description: Including pain rating scale, medication(s)/side effects and non-pharmacologic comfort measures Outcome: Progressing   Problem: Health Behavior/Discharge Planning: Goal: Ability to manage health-related needs will improve Outcome: Progressing   Problem: Clinical Measurements: Goal: Ability to maintain clinical measurements within normal limits will improve Outcome: Progressing Goal: Will remain free from infection Outcome: Progressing Goal: Diagnostic test results will improve Outcome: Progressing Goal: Respiratory complications will improve Outcome: Progressing Goal: Cardiovascular complication will be avoided Outcome: Progressing   Problem: Activity: Goal: Risk for activity intolerance will decrease Outcome: Progressing   Problem: Nutrition: Goal: Adequate nutrition will be maintained Outcome: Progressing   Problem: Coping: Goal: Level of anxiety will decrease Outcome: Progressing   Problem: Elimination: Goal: Will not experience complications related to bowel motility Outcome: Progressing Goal: Will not experience complications related to urinary retention Outcome: Progressing   Problem: Pain Managment: Goal: General experience of comfort will improve Outcome: Progressing   Problem: Safety: Goal: Ability to remain free from  injury will improve Outcome: Progressing   Problem: Skin Integrity: Goal: Risk for impaired skin integrity will decrease Outcome: Progressing   

## 2023-02-27 NOTE — Progress Notes (Signed)
ANTICOAGULATION CONSULT NOTE - Follow-up Consult  Pharmacy Consult for Heparin  Indication: chest pain/ACS  Allergies  Allergen Reactions   Shellfish-Derived Products Anaphylaxis   Zolpidem Tartrate Other (See Comments)    Hallucinations    Patient Measurements: Height: 5\' 7"  (170.2 cm) Weight: 68.7 kg (151 lb 7.3 oz) IBW/kg (Calculated) : 66.1 Heparin DW = 68 kg  Vital Signs: Temp: 98.7 F (37.1 C) (08/19 0746) Temp Source: Oral (08/19 0746) BP: 109/63 (08/19 0746) Pulse Rate: 81 (08/19 0746)  Labs: Recent Labs    02/25/23 0749 02/26/23 0141 02/26/23 1123 02/26/23 2117 02/27/23 0727  HGB 10.5* 10.2*  --   --  9.8*  HCT 30.5* 28.5*  --   --  27.5*  PLT 146* 173  --   --  167  HEPARINUNFRC  --  0.70 0.77* 0.89* 0.70  CREATININE 2.29* 2.07*  --   --  2.01*    Estimated Creatinine Clearance: 28.8 mL/min (A) (by C-G formula based on SCr of 2.01 mg/dL (H)).   Assessment: 77 y/o M with chest pain at rest beginning 8/11, unrelieved by SL NTG, some help with morphine.  Unable to get LHC on 8/14 due to agitation, now awaiting improvement in renal function.  No anticoagulation PTA, note potential LV thrombus but unable to be confirm due to inability to receive contrast.  8/16 ECHO noted inability to exclude thrombus.   Heparin level 0.7 on upper end of therapeutic on 1350 units/hr.  No cath today w/ renal dysfunction.  No s/sx of bleeding (nosebleed improved) or infusion issues. Drawn appropriately.    Goal of Therapy:  Heparin level 0.3-0.7 units/ml Monitor platelets by anticoagulation protocol: Yes   Plan:  Continue heparin infusion to 1350 units/hr.  Recheck heparin level in 8 hours Monitor daily CBC/Heparin levels while on UFH Monitor for signs of bleeding  Trixie Rude, PharmD Clinical Pharmacist 02/27/2023  9:59 AM  Please check AMION for all [facility] Pharmacy phone numbers After 10:00 PM, call Main Pharmacy [phone]

## 2023-02-27 NOTE — Care Management Important Message (Signed)
Important Message  Patient Details  Name: Nathan [name] MRN: [id number] Date of Birth: [birth year]   Medicare Important Message Given:  Yes     Sherilyn Banker 02/27/2023, 3:56 PM

## 2023-02-27 NOTE — Progress Notes (Addendum)
Rounding Note    Patient Name: Nathan [name] Date of Encounter: 02/27/2023  [facility] Cardiologist: Donato Schultz, MD   Subjective   Started Zosyn yesterday 2/2 possible PNA   Cr remains ~2 (baseline last year ~1.2-1.5) and WBC up to >17  Patient has a dry cough and otherwise no complaints.  Inpatient Medications    Scheduled Meds:  allopurinol  300 mg Oral Daily   vitamin C  1,000 mg Oral Daily   aspirin EC  81 mg Oral Daily   atorvastatin  80 mg Oral Daily   hydrALAZINE  25 mg Oral Q8H   insulin aspart  0-15 Units Subcutaneous TID WC   isosorbide mononitrate  30 mg Oral Daily   metoprolol tartrate  25 mg Oral Q6H   Continuous Infusions:  sodium chloride Stopped (02/24/23 0824)   sodium chloride Stopped (02/21/23 1508)   heparin 1,350 Units/hr (02/27/23 0300)   piperacillin-tazobactam (ZOSYN)  IV 3.375 g (02/27/23 0400)   PRN Meds: sodium chloride, acetaminophen, nitroGLYCERIN, mouth rinse, oxyCODONE-acetaminophen, senna-docusate   Vital Signs    Vitals:   02/27/23 0032 02/27/23 0400 02/27/23 0547 02/27/23 0746  BP: 129/74 120/63 119/67 109/63  Pulse: 80 84  81  Resp: 16 16  17   Temp: 97.6 F (36.4 C) 99 F (37.2 C)  98.7 F (37.1 C)  TempSrc: Oral Oral  Oral  SpO2: 95%     Weight:      Height:        Intake/Output Summary (Last 24 hours) at 02/27/2023 0848 Last data filed at 02/27/2023 0437 Gross per 24 hour  Intake 384.61 ml  Output 725 ml  Net -340.39 ml      02/25/2023    6:42 PM 02/23/2023    6:00 AM 02/22/2023    6:00 AM  Last 3 Weights  Weight (lbs) 151 lb 7.3 oz 151 lb 7.3 oz 144 lb 13.5 oz  Weight (kg) 68.7 kg 68.7 kg 65.7 kg      Telemetry    SR with PVCs - Personally Reviewed  ECG    8/16  AF RVR without ischemic changes- Personally Reviewed  Physical Exam   GEN: No acute distress.   Neck: No JVD Cardiac: RRR, no murmurs, rubs, or gallops.  Respiratory: Clear to auscultation bilaterally. GI: Soft, nontender,  non-distended  MS: No edema; No deformity. Neuro:  Nonfocal  Psych: Normal affect   Labs    High Sensitivity Troponin:   Recent Labs  Lab 02/20/23 2120 02/20/23 2344 02/21/23 0624 02/21/23 1815 02/21/23 2110  TROPONINIHS 13 12 31* 4,527* 6,445*     Chemistry Recent Labs  Lab 02/23/23 0002 02/23/23 2335 02/25/23 0749 02/26/23 0141  NA 137 134* 134* 132*  K 4.7 3.5 3.5 3.8  CL 103 99 97* 98  CO2 20* 21* 18* 19*  GLUCOSE 178* 115* 198* 202*  BUN 44* 50* 62* 64*  CREATININE 2.13* 1.99* 2.29* 2.07*  CALCIUM 8.6* 8.5* 8.4* 8.4*  MG 2.2 1.7 2.0  --   PROT 6.0* 5.8* 6.0* 5.8*  ALBUMIN 2.9* 2.6* 2.6* 2.4*  AST 95* 82* 73* 94*  ALT 59* 56* 60* 72*  ALKPHOS 68 75 95 90  BILITOT 2.6* 2.6* 2.7* 2.1*  GFRNONAA 31* 34* 29* 32*  ANIONGAP 14 14 19* 15    Lipids  Recent Labs  Lab 02/23/23 0002  CHOL 87  TRIG 171*  HDL 16*  LDLCALC 37  CHOLHDL 5.4    Hematology Recent Labs  Lab  02/23/23 2335 02/25/23 0749 02/26/23 0141  WBC 17.6* 18.0* 17.0*  RBC 3.62* 3.71* 3.61*  HGB 10.2* 10.5* 10.2*  HCT 29.8* 30.5* 28.5*  MCV 82.3 82.2 78.9*  MCH 28.2 28.3 28.3  MCHC 34.2 34.4 35.8  RDW 16.7* 16.8* 16.5*  PLT 168 146* 173   Thyroid  Recent Labs  Lab 02/23/23 0002  TSH 0.953    BNPNo results for input(s): "BNP", "PROBNP" in the last 168 hours.  DDimer No results for input(s): "DDIMER" in the last 168 hours.   Radiology    DG CHEST PORT 1 VIEW  Result Date: 02/25/2023 CLINICAL DATA:  Chest pain, cough EXAM: PORTABLE CHEST 1 VIEW COMPARISON:  02/21/2023 FINDINGS: Mild bilateral interstitial thickening with more focal left lower lobe airspace disease. No pleural effusion or pneumothorax. Stable cardiomediastinal silhouette. Prior CABG. No acute osseous abnormality. IMPRESSION: 1. Mild bilateral interstitial thickening with more focal left lower lobe airspace disease concerning for mild pulmonary edema versus pneumonia. Electronically Signed   By: Elige Ko M.D.   On:  02/25/2023 13:14    Cardiac Studies   Echo 02/24/23: 1. Diffuse hypokinesis worse in the inferior, inferoseptal and apical  walls; distal inferior akinesis. Cannot completely exlude mural thrombus  at apex, recomm limited echo wit Definity to confirm . Left ventricular  ejection fraction, by estimation, is  25 to 30%. The left ventricle has severely decreased function. Left  ventricular diastolic parameters are indeterminate.   2. Right ventricular systolic function is normal. The right ventricular  size is normal.   3. Left atrial size was mildly dilated.   4. Mild mitral valve regurgitation. Moderate to severe mitral annular  calcification.   5. S/p AVR (23 mm Edwards Bioprosthesis; procedure date 2010) Peak and  mean gradients through the valve are 14 and 7 mm respectively. . The  aortic valve has been repaired/replaced. Aortic valve regurgitation is not  visualized.   6. The inferior vena cava is dilated in size with <50% respiratory  variability, suggesting right atrial pressure of 15 mmHg.   Patient Profile     77 y.o. male with CAD status post CABG, aortic valve replacement, CKD, diabetes, hypertension, hyperlipidemia who was admitted with non-STEMI and acute systolic heart failure.   Assessment & Plan    NSTEMI CAD s/p CABG (LIMA to LAD, VG to OM and VG to PDA [occluded] and AVR (2010) -CP free on heparin gtt.  Given Cr ~2 will hold off on cor + RHC today.  Diet today, NPO at MN.  Will give judicious IVF overnight, check BMP in AM.  Cont ASA, lopressor, high dose lipitor.  CKD III - sCr 2.01 (2.07) - baseline appears to be 1.4-1.5 - judicious IVF overnight (100cc/hr x 10 hr), check BMP in AM.  Acute systolic heart failure - echo this admission with LVEF 25-30%, normal RV, mild LAE, mld MR - albumin 2.3 - euvolemic (if not dry); give IVF as above. - GDMT: hydralzine 25 mg TID, 30 mg imdur, 25 mg lopressor q6hr; if Cr improves, will optimize regimen  AVR -  bioprosthetic valve 2010 - no evidence of bioprosthetic valve dysfunction  PNA -on Zosyn day 2 and without fever or productive cough.  CXR findings may represent pulmonary edema however WBC elevated.  Check procalcitonin.  T2DM -SSI, once Cr improved, will consider jardiance; ARB/Entresto  Dementia Mild, monitor       For questions or updates, please contact [facility] Please consult [email or web address] for contact info under  Romilda Joy 02/27/2023, 8:48 AM

## 2023-02-27 NOTE — Progress Notes (Signed)
    Durable Medical Equipment  (From admission, onward)           Start     Ordered   02/27/23 1401  For home use only DME Walker rolling  Once       Question Answer Comment  Walker: With 5 Inch Wheels   Patient needs a walker to treat with the following condition Weakness      02/27/23 1400

## 2023-02-27 NOTE — TOC Progression Note (Signed)
Transition of Care [facility]) - Progression Note    Patient Details  Name: Nathan [name] MRN: [id number] Date of Birth: [birth year]  [facility] CM/SW Contact  Epifanio Lesches, RN Phone Number: 02/27/2023, 1:08 PM  Clinical Narrative:    NCM shared  PT's recommendations with pt/wife.Both agreeable to home health services. Pt /wife without provider preference. Referral made with [facility], [place] and acceptance pending MD order., order requested from MD. Referral made with [facility]/ [employer] for DME: RW. Equipment will be delivered to bedside prior to d/c.  [facility] team following and will  continue assist with needs.   Expected Discharge Plan: Home w Home Health Services Barriers to Discharge: Continued Medical Work up  Expected Discharge Plan and Services   Discharge Planning Services: CM Consult   Living arrangements for the past 2 months: Single Family Home                 DME Arranged: Walker rolling DME Agency: [employer] Date DME Agency Contacted: 02/27/23 Time DME Agency Contacted: 1302 Representative spoke with at DME Agency: Ian Malkin HH Arranged: PT HH Agency: [facility] Date [facility] Agency Contacted: 02/27/23 Time HH Agency Contacted: 1302 Representative spoke with at [facility] Agency: Tresa Endo   Social Determinants of Health (SDOH) Interventions SDOH Screenings   Food Insecurity: No Food Insecurity (02/25/2023)  Housing: Low Risk  (02/25/2023)  Transportation Needs: No Transportation Needs (02/25/2023)  Utilities: Not At Risk (02/25/2023)  Tobacco Use: Low Risk  (02/27/2023)    Readmission Risk Interventions    02/22/2023    2:58 PM  Readmission Risk Prevention Plan  Transportation Screening Complete  HRI or Home Care Consult Complete  Social Work Consult for Recovery Care Planning/Counseling Complete  Palliative Care Screening Not Applicable  Medication Review Oceanographer) Referral to Pharmacy

## 2023-02-28 ENCOUNTER — Inpatient Hospital Stay (HOSPITAL_COMMUNITY): Payer: Medicare Other

## 2023-02-28 DIAGNOSIS — R4182 Altered mental status, unspecified: Secondary | ICD-10-CM | POA: Diagnosis not present

## 2023-02-28 DIAGNOSIS — I5189 Other ill-defined heart diseases: Secondary | ICD-10-CM

## 2023-02-28 DIAGNOSIS — I5043 Acute on chronic combined systolic (congestive) and diastolic (congestive) heart failure: Secondary | ICD-10-CM

## 2023-02-28 DIAGNOSIS — G9341 Metabolic encephalopathy: Secondary | ICD-10-CM | POA: Diagnosis present

## 2023-02-28 LAB — HEPARIN LEVEL (UNFRACTIONATED): Heparin Unfractionated: 0.52 [IU]/mL (ref 0.30–0.70)

## 2023-02-28 LAB — COMPREHENSIVE METABOLIC PANEL
ALT: 58 U/L — ABNORMAL HIGH (ref 0–44)
ALT: 60 U/L — ABNORMAL HIGH (ref 0–44)
AST: 72 U/L — ABNORMAL HIGH (ref 15–41)
AST: 73 U/L — ABNORMAL HIGH (ref 15–41)
Albumin: 2.2 g/dL — ABNORMAL LOW (ref 3.5–5.0)
Albumin: 2.2 g/dL — ABNORMAL LOW (ref 3.5–5.0)
Alkaline Phosphatase: 81 U/L (ref 38–126)
Alkaline Phosphatase: 74 U/L (ref 38–126)
Anion gap: 17 — ABNORMAL HIGH (ref 5–15)
Anion gap: 15 (ref 5–15)
BUN: 48 mg/dL — ABNORMAL HIGH (ref 8–23)
BUN: 45 mg/dL — ABNORMAL HIGH (ref 8–23)
CO2: 20 mmol/L — ABNORMAL LOW (ref 22–32)
CO2: 22 mmol/L (ref 22–32)
Calcium: 8.4 mg/dL — ABNORMAL LOW (ref 8.9–10.3)
Calcium: 8.5 mg/dL — ABNORMAL LOW (ref 8.9–10.3)
Chloride: 101 mmol/L (ref 98–111)
Chloride: 100 mmol/L (ref 98–111)
Creatinine, Ser: 2.12 mg/dL — ABNORMAL HIGH (ref 0.61–1.24)
Creatinine, Ser: 1.96 mg/dL — ABNORMAL HIGH (ref 0.61–1.24)
GFR, Estimated: 31 mL/min — ABNORMAL LOW (ref >60)
GFR, Estimated: 35 mL/min — ABNORMAL LOW (ref >60)
Glucose, Bld: 155 mg/dL — ABNORMAL HIGH (ref 70–99)
Glucose, Bld: 199 mg/dL — ABNORMAL HIGH (ref 70–99)
Potassium: 3.3 mmol/L — ABNORMAL LOW (ref 3.5–5.1)
Potassium: 3 mmol/L — ABNORMAL LOW (ref 3.5–5.1)
Sodium: 138 mmol/L (ref 135–145)
Sodium: 137 mmol/L (ref 135–145)
Total Bilirubin: 2.5 mg/dL — ABNORMAL HIGH (ref 0.3–1.2)
Total Bilirubin: 2.4 mg/dL — ABNORMAL HIGH (ref 0.3–1.2)
Total Protein: 5.8 g/dL — ABNORMAL LOW (ref 6.5–8.1)
Total Protein: 6.1 g/dL — ABNORMAL LOW (ref 6.5–8.1)

## 2023-02-28 LAB — GLUCOSE, CAPILLARY
Glucose-Capillary: 170 mg/dL — ABNORMAL HIGH (ref 70–99)
Glucose-Capillary: 140 mg/dL — ABNORMAL HIGH (ref 70–99)
Glucose-Capillary: 214 mg/dL — ABNORMAL HIGH (ref 70–99)
Glucose-Capillary: 125 mg/dL — ABNORMAL HIGH (ref 70–99)

## 2023-02-28 LAB — CBC WITH DIFFERENTIAL/PLATELET
Abs Immature Granulocytes: 1.41 10*3/uL — ABNORMAL HIGH (ref 0.00–0.07)
Basophils Absolute: 0.1 10*3/uL (ref 0.0–0.1)
Basophils Relative: 0 %
Eosinophils Absolute: 0 10*3/uL (ref 0.0–0.5)
Eosinophils Relative: 0 %
HCT: 22.9 % — ABNORMAL LOW (ref 39.0–52.0)
Hemoglobin: 8.2 g/dL — ABNORMAL LOW (ref 13.0–17.0)
Immature Granulocytes: 7 %
Lymphocytes Relative: 6 %
Lymphs Abs: 1.2 10*3/uL (ref 0.7–4.0)
MCH: 28.7 pg (ref 26.0–34.0)
MCHC: 35.8 g/dL (ref 30.0–36.0)
MCV: 80.1 fL (ref 80.0–100.0)
Monocytes Absolute: 2.1 10*3/uL — ABNORMAL HIGH (ref 0.1–1.0)
Monocytes Relative: 10 %
Neutro Abs: 16.1 10*3/uL — ABNORMAL HIGH (ref 1.7–7.7)
Neutrophils Relative %: 77 %
Platelets: 215 10*3/uL (ref 150–400)
RBC: 2.86 MIL/uL — ABNORMAL LOW (ref 4.22–5.81)
RDW: 17.1 % — ABNORMAL HIGH (ref 11.5–15.5)
WBC: 20.8 10*3/uL — ABNORMAL HIGH (ref 4.0–10.5)
nRBC: 0.3 % — ABNORMAL HIGH (ref 0.0–0.2)

## 2023-02-28 LAB — URINALYSIS, ROUTINE W REFLEX MICROSCOPIC
Bilirubin Urine: NEGATIVE
Glucose, UA: NEGATIVE mg/dL
Ketones, ur: 20 mg/dL — AB
Leukocytes,Ua: NEGATIVE
Nitrite: NEGATIVE
Protein, ur: NEGATIVE mg/dL
Specific Gravity, Urine: 1.016 (ref 1.005–1.030)
pH: 5 (ref 5.0–8.0)

## 2023-02-28 LAB — RESPIRATORY PANEL BY PCR

## 2023-02-28 LAB — CBC
HCT: 24 % — ABNORMAL LOW (ref 39.0–52.0)
Hemoglobin: 8.5 g/dL — ABNORMAL LOW (ref 13.0–17.0)
MCH: 28.6 pg (ref 26.0–34.0)
MCHC: 35.4 g/dL (ref 30.0–36.0)
MCV: 80.8 fL (ref 80.0–100.0)
Platelets: 202 10*3/uL (ref 150–400)
RBC: 2.97 MIL/uL — ABNORMAL LOW (ref 4.22–5.81)
RDW: 17 % — ABNORMAL HIGH (ref 11.5–15.5)
WBC: 19.9 10*3/uL — ABNORMAL HIGH (ref 4.0–10.5)
nRBC: 0.4 % — ABNORMAL HIGH (ref 0.0–0.2)

## 2023-02-28 LAB — LACTIC ACID, PLASMA
Lactic Acid, Venous: 1.3 mmol/L (ref 0.5–1.9)
Lactic Acid, Venous: 1.5 mmol/L (ref 0.5–1.9)
Lactic Acid, Venous: 2.1 mmol/L (ref 0.5–1.9)

## 2023-02-28 LAB — URINE CULTURE: Culture: NO GROWTH

## 2023-02-28 LAB — HEPATITIS PANEL, ACUTE
HCV Ab: NONREACTIVE
Hep A IgM: NONREACTIVE
Hep B C IgM: NONREACTIVE
Hepatitis B Surface Ag: NONREACTIVE

## 2023-02-28 MED ORDER — SODIUM CHLORIDE 0.9 % IV SOLN
2.0000 g | INTRAVENOUS | Status: DC
  Administered 2023-03-01: 2 g via INTRAVENOUS
  Filled 2023-02-28: qty 12.5

## 2023-02-28 MED ORDER — MELATONIN 5 MG PO TABS
5.0000 mg | ORAL_TABLET | Freq: Every day | ORAL | Status: DC
  Administered 2023-02-28 – 2023-03-12 (×12): 5 mg via ORAL
  Filled 2023-02-28 (×12): qty 1

## 2023-02-28 MED ORDER — MELATONIN 5 MG PO TABS
ORAL_TABLET | ORAL | Status: AC
  Filled 2023-02-28: qty 1

## 2023-02-28 MED ORDER — PERFLUTREN LIPID MICROSPHERE
1.0000 mL | INTRAVENOUS | Status: AC | PRN
  Administered 2023-02-28: 2 mL via INTRAVENOUS

## 2023-02-28 MED ORDER — POLYETHYLENE GLYCOL 3350 17 G PO PACK
17.0000 g | PACK | Freq: Every day | ORAL | Status: DC
  Administered 2023-03-06 – 2023-03-13 (×8): 17 g via ORAL
  Filled 2023-02-28 (×9): qty 1

## 2023-02-28 MED ORDER — METOPROLOL TARTRATE 25 MG PO TABS
25.0000 mg | ORAL_TABLET | Freq: Two times a day (BID) | ORAL | Status: AC
  Administered 2023-02-28 – 2023-03-07 (×14): 25 mg via ORAL
  Filled 2023-02-28 (×15): qty 1

## 2023-02-28 MED ORDER — VANCOMYCIN HCL 1500 MG/300ML IV SOLN
1500.0000 mg | Freq: Once | INTRAVENOUS | Status: AC
  Administered 2023-02-28: 1500 mg via INTRAVENOUS
  Filled 2023-02-28: qty 300

## 2023-02-28 MED ORDER — VANCOMYCIN HCL 1250 MG/250ML IV SOLN: 1250.0000 mg | INTRAVENOUS | Status: DC

## 2023-02-28 MED ORDER — DOCUSATE SODIUM 100 MG PO CAPS
100.0000 mg | ORAL_CAPSULE | Freq: Two times a day (BID) | ORAL | Status: DC
  Administered 2023-02-28 – 2023-03-13 (×24): 100 mg via ORAL
  Filled 2023-02-28 (×24): qty 1

## 2023-02-28 MED ORDER — METRONIDAZOLE 500 MG/100ML IV SOLN
500.0000 mg | Freq: Two times a day (BID) | INTRAVENOUS | Status: DC
  Administered 2023-02-28 – 2023-03-02 (×4): 500 mg via INTRAVENOUS
  Filled 2023-02-28 (×4): qty 100

## 2023-02-28 MED ORDER — METOPROLOL TARTRATE 25 MG PO TABS: 25.0000 mg | ORAL_TABLET | Freq: Once | ORAL | Status: DC

## 2023-02-28 MED ORDER — SODIUM CHLORIDE 0.9 % IV SOLN
2.0000 g | Freq: Once | INTRAVENOUS | Status: AC
  Administered 2023-02-28: 2 g via INTRAVENOUS
  Filled 2023-02-28: qty 12.5

## 2023-02-28 MED ORDER — METOPROLOL TARTRATE 50 MG PO TABS: 50.0000 mg | ORAL_TABLET | Freq: Two times a day (BID) | ORAL | Status: DC

## 2023-02-28 NOTE — Progress Notes (Signed)
ANTICOAGULATION CONSULT NOTE  Pharmacy Consult for Heparin  Indication: chest pain/ACS  Allergies  Allergen Reactions   Shellfish-Derived Products Anaphylaxis   Zolpidem Tartrate Other (See Comments)    Hallucinations    Patient Measurements: Height: 5\' 7"  (170.2 cm) Weight: 68.7 kg (151 lb 7.3 oz) IBW/kg (Calculated) : 66.1 Heparin DW = 68 kg  Vital Signs: Temp: 97.7 F (36.5 C) (08/20 0756) Temp Source: Oral (08/20 0756) BP: 110/67 (08/20 0756) Pulse Rate: 78 (08/20 0756)  Labs: Recent Labs    02/26/23 0141 02/26/23 1123 02/27/23 0727 02/27/23 1557 02/28/23 0659  HGB 10.2*  --  9.8*  --  8.5*  HCT 28.5*  --  27.5*  --  24.0*  PLT 173  --  167  --  202  HEPARINUNFRC 0.70   < > 0.70 0.59 0.52  CREATININE 2.07*  --  2.01*  --  2.12*   < > = values in this interval not displayed.    Estimated Creatinine Clearance: 27.3 mL/min (A) (by C-G formula based on SCr of 2.12 mg/dL (H)).   Assessment: 77 y/o M with chest pain at rest beginning 8/11, unrelieved by SL NTG, some help with morphine.  Unable to get LHC on 8/14 due to agitation, now awaiting improvement in renal function.  No anticoagulation PTA, note potential LV thrombus but unable to be confirm due to inability to receive contrast.  8/16 ECHO noted inability to exclude thrombus.   Heparin level 0.52 therapeutic on 1350 units/hr.  LHC deferred with ongoing renal dysfunction.  No s/sx of bleeding (no further reports of nosebleeds) or infusion issues.   Goal of Therapy:  Heparin level 0.3-0.7 units/ml Monitor platelets by anticoagulation protocol: Yes   Plan:  Continue heparin infusion at 1350 units/hr Check heparin level daily while on heparin Continue to monitor H&H and platelets  Thank you for allowing pharmacy to be a part of this patient's care.  Trixie Rude, PharmD Clinical Pharmacist 02/28/2023  9:00 AM  Please check AMION for all [facility] Pharmacy phone numbers After 10:00 PM, call Main Pharmacy  [phone]

## 2023-02-28 NOTE — Progress Notes (Signed)
Pharmacy Antibiotic Note  Nathan [name] is a 77 y.o. male admitted on 02/20/2023 with with chest pain and started on IV heparin for ACS.  He has been on Zosyn for possible PNA.  CXR today 8/20 is negative for acute cardiopulmonary disease.  Pharmacy has been consulted to broaden antibiotic to vancomycin and Cefepime for sepsis of unknown origin.  Flagyl is also added.  SCr 2.12, afebrile, WBC elevated at 19.9, lactate WNL.  Plan: Vanc 1500mg  IV x 1, then 1250mg  IV Q48H for AUC 498 using SCr 2.12 Cefepime 2g IV Q24H Flagyl 500mg  IV Q12H per MD Monitor renal fxn, clinical progress F/U ID consult   Height: 5\' 7"  (170.2 cm) Weight: 68.7 kg (151 lb 7.3 oz) IBW/kg (Calculated) : 66.1  Temp (24hrs), Avg:98.2 F (36.8 C), Min:97.7 F (36.5 C), Max:98.5 F (36.9 C)  Recent Labs  Lab 02/23/23 2335 02/25/23 0749 02/26/23 0141 02/27/23 0727 02/28/23 0659 02/28/23 0934  WBC 17.6* 18.0* 17.0* 17.9* 19.9*  --   CREATININE 1.99* 2.29* 2.07* 2.01* 2.12*  --   LATICACIDVEN  --   --   --   --   --  1.3    Estimated Creatinine Clearance: 27.3 mL/min (A) (by C-G formula based on SCr of 2.12 mg/dL (H)).    Allergies  Allergen Reactions   Shellfish-Derived Products Anaphylaxis   Zolpidem Tartrate Other (See Comments)    Hallucinations   Zosyn 8/18 >> 8/20 Vanc 8/20 >>  Cefepime 8/20 >> Flagyl 8/20 >>  8/13 MRSA PCR: negative 8/17 Blood Cx: ngtd 8/20 BCx -   Neeley Sedivy D. Laney Potash, PharmD, BCPS, BCCCP 02/28/2023, 4:40 PM

## 2023-02-28 NOTE — Progress Notes (Signed)
Pt was given AM dose Lopressor 25 mg. Pt BP 116/95. AM dose of Apresoline 25 mg held. Will let day shift know held. Will continue to monitor.

## 2023-02-28 NOTE — Plan of Care (Signed)
  Problem: Education: Goal: Understanding of cardiac disease, CV risk reduction, and recovery process will improve Outcome: Progressing Goal: Individualized Educational Video(s) Outcome: Progressing   Problem: Activity: Goal: Ability to tolerate increased activity will improve Outcome: Progressing   Problem: Cardiac: Goal: Ability to achieve and maintain adequate cardiovascular perfusion will improve Outcome: Progressing   Problem: Health Behavior/Discharge Planning: Goal: Ability to safely manage health-related needs after discharge will improve Outcome: Progressing   Problem: Education: Goal: Ability to describe self-care measures that may prevent or decrease complications (Diabetes Survival Skills Education) will improve Outcome: Progressing Goal: Individualized Educational Video(s) Outcome: Progressing   Problem: Coping: Goal: Ability to adjust to condition or change in health will improve Outcome: Progressing   Problem: Fluid Volume: Goal: Ability to maintain a balanced intake and output will improve Outcome: Progressing   Problem: Health Behavior/Discharge Planning: Goal: Ability to identify and utilize available resources and services will improve Outcome: Progressing Goal: Ability to manage health-related needs will improve Outcome: Progressing   Problem: Metabolic: Goal: Ability to maintain appropriate glucose levels will improve Outcome: Progressing   Problem: Nutritional: Goal: Maintenance of adequate nutrition will improve Outcome: Progressing Goal: Progress toward achieving an optimal weight will improve Outcome: Progressing   Problem: Skin Integrity: Goal: Risk for impaired skin integrity will decrease Outcome: Progressing   Problem: Tissue Perfusion: Goal: Adequacy of tissue perfusion will improve Outcome: Progressing   Problem: Education: Goal: Understanding of CV disease, CV risk reduction, and recovery process will improve Outcome:  Progressing Goal: Individualized Educational Video(s) Outcome: Progressing   Problem: Activity: Goal: Ability to return to baseline activity level will improve Outcome: Progressing   Problem: Cardiovascular: Goal: Ability to achieve and maintain adequate cardiovascular perfusion will improve Outcome: Progressing Goal: Vascular access site(s) Level 0-1 will be maintained Outcome: Progressing   Problem: Health Behavior/Discharge Planning: Goal: Ability to safely manage health-related needs after discharge will improve Outcome: Progressing   Problem: Education: Goal: Knowledge of General Education information will improve Description: Including pain rating scale, medication(s)/side effects and non-pharmacologic comfort measures Outcome: Progressing   Problem: Health Behavior/Discharge Planning: Goal: Ability to manage health-related needs will improve Outcome: Progressing   Problem: Clinical Measurements: Goal: Ability to maintain clinical measurements within normal limits will improve Outcome: Progressing Goal: Will remain free from infection Outcome: Progressing Goal: Diagnostic test results will improve Outcome: Progressing Goal: Respiratory complications will improve Outcome: Progressing Goal: Cardiovascular complication will be avoided Outcome: Progressing   Problem: Activity: Goal: Risk for activity intolerance will decrease Outcome: Progressing   Problem: Nutrition: Goal: Adequate nutrition will be maintained Outcome: Progressing   Problem: Coping: Goal: Level of anxiety will decrease Outcome: Progressing   Problem: Elimination: Goal: Will not experience complications related to bowel motility Outcome: Progressing Goal: Will not experience complications related to urinary retention Outcome: Progressing   Problem: Pain Managment: Goal: General experience of comfort will improve Outcome: Progressing   Problem: Safety: Goal: Ability to remain free from  injury will improve Outcome: Progressing   Problem: Skin Integrity: Goal: Risk for impaired skin integrity will decrease Outcome: Progressing   

## 2023-02-28 NOTE — Progress Notes (Addendum)
Rounding Note    Patient Name: Nathan [name] Date of Encounter: 02/28/2023  [facility] Cardiologist: Donato Schultz, MD   Subjective   Pt has no complaints, but recounts his hallucinations  Inpatient Medications    Scheduled Meds:  allopurinol  300 mg Oral Daily   vitamin C  1,000 mg Oral Daily   aspirin EC  81 mg Oral Daily   atorvastatin  80 mg Oral Daily   hydrALAZINE  25 mg Oral Q8H   insulin aspart  0-15 Units Subcutaneous TID WC   isosorbide mononitrate  30 mg Oral Daily   metoprolol tartrate  25 mg Oral Q6H   Continuous Infusions:  sodium chloride Stopped (02/24/23 0824)   heparin 1,350 Units/hr (02/28/23 0316)   piperacillin-tazobactam (ZOSYN)  IV 3.375 g (02/28/23 0330)   PRN Meds: sodium chloride, acetaminophen, nitroGLYCERIN, mouth rinse, oxyCODONE-acetaminophen, senna-docusate   Vital Signs    Vitals:   02/28/23 0027 02/28/23 0332 02/28/23 0504 02/28/23 0756  BP: 116/65 124/68 (!) 116/95 110/67  Pulse: 80 75 83 78  Resp:  16  16  Temp:  98 F (36.7 C)  97.7 F (36.5 C)  TempSrc:  Oral  Oral  SpO2:    98%  Weight:      Height:        Intake/Output Summary (Last 24 hours) at 02/28/2023 0820 Last data filed at 02/28/2023 0316 Gross per 24 hour  Intake 890.23 ml  Output --  Net 890.23 ml      02/25/2023    6:42 PM 02/23/2023    6:00 AM 02/22/2023    6:00 AM  Last 3 Weights  Weight (lbs) 151 lb 7.3 oz 151 lb 7.3 oz 144 lb 13.5 oz  Weight (kg) 68.7 kg 68.7 kg 65.7 kg      Telemetry    Rate controlled Afib in the 70s - Personally Reviewed  ECG    No new tracings - Personally Reviewed  Physical Exam   GEN: No acute distress.   Neck: No JVD Cardiac: RRR, no murmurs, rubs, or gallops.  Respiratory: Clear to auscultation bilaterally, cough GI: Soft, nontender, non-distended  MS: No edema; No deformity. Neuro:  Nonfocal  Psych: Normal affect   Labs    High Sensitivity Troponin:   Recent Labs  Lab 02/20/23 2120  02/20/23 2344 02/21/23 0624 02/21/23 1815 02/21/23 2110  TROPONINIHS 13 12 31* 4,527* 6,445*     Chemistry Recent Labs  Lab 02/23/23 0002 02/23/23 2335 02/25/23 0749 02/26/23 0141 02/27/23 0727  NA 137 134* 134* 132* 135  K 4.7 3.5 3.5 3.8 3.3*  CL 103 99 97* 98 98  CO2 20* 21* 18* 19* 18*  GLUCOSE 178* 115* 198* 202* 182*  BUN 44* 50* 62* 64* 54*  CREATININE 2.13* 1.99* 2.29* 2.07* 2.01*  CALCIUM 8.6* 8.5* 8.4* 8.4* 8.4*  MG 2.2 1.7 2.0  --   --   PROT 6.0* 5.8* 6.0* 5.8* 6.0*  ALBUMIN 2.9* 2.6* 2.6* 2.4* 2.3*  AST 95* 82* 73* 94* 67*  ALT 59* 56* 60* 72* 63*  ALKPHOS 68 75 95 90 79  BILITOT 2.6* 2.6* 2.7* 2.1* 2.5*  GFRNONAA 31* 34* 29* 32* 34*  ANIONGAP 14 14 19* 15 19*    Lipids  Recent Labs  Lab 02/23/23 0002  CHOL 87  TRIG 171*  HDL 16*  LDLCALC 37  CHOLHDL 5.4    Hematology Recent Labs  Lab 02/26/23 0141 02/27/23 0727 02/28/23 0659  WBC 17.0* 17.9*  19.9*  RBC 3.61* 3.48* 2.97*  HGB 10.2* 9.8* 8.5*  HCT 28.5* 27.5* 24.0*  MCV 78.9* 79.0* 80.8  MCH 28.3 28.2 28.6  MCHC 35.8 35.6 35.4  RDW 16.5* 16.8* 17.0*  PLT 173 167 202   Thyroid  Recent Labs  Lab 02/23/23 0002  TSH 0.953    BNPNo results for input(s): "BNP", "PROBNP" in the last 168 hours.  DDimer No results for input(s): "DDIMER" in the last 168 hours.   Radiology    No results found.  Cardiac Studies   Echo 02/24/23: 1. Diffuse hypokinesis worse in the inferior, inferoseptal and apical  walls; distal inferior akinesis. Cannot completely exlude mural thrombus  at apex, recomm limited echo wit Definity to confirm . Left ventricular  ejection fraction, by estimation, is  25 to 30%. The left ventricle has severely decreased function. Left  ventricular diastolic parameters are indeterminate.   2. Right ventricular systolic function is normal. The right ventricular  size is normal.   3. Left atrial size was mildly dilated.   4. Mild mitral valve regurgitation. Moderate to severe  mitral annular  calcification.   5. S/p AVR (23 mm Edwards Bioprosthesis; procedure date 2010) Peak and  mean gradients through the valve are 14 and 7 mm respectively. . The  aortic valve has been repaired/replaced. Aortic valve regurgitation is not  visualized.   6. The inferior vena cava is dilated in size with <50% respiratory  variability, suggesting right atrial pressure of 15 mmHg.   Patient Profile     77 y.o. male with CAD status post CABG, aortic valve replacement, CKD, diabetes, hypertension, hyperlipidemia who was admitted with non-STEMI and acute systolic heart failure.   Assessment & Plan    NSTEMI CAD s/p CABG (LIMA-LAD, SVG-OM, occluded SVG-PDA) - 2001 - presented with chest pain concerning for angina with elevated troponin 13 --> 6445  - no further chest pain - initially planning for LHC, but complicated by acute illness and AKI - continue heparin   S/P AVR - 2001 - stable function on recent echo   Acute systolic heart failure - echo this admission with LVEF 25-30% - albumin 2.3 - yesterday felt possibly dry - treated with gentle IVF yesterday - GDMT: hydralazine 25 mg TID, 30 mg imdur, 25 mg lopressor q6hr - missed 3 consecutive doses of hydralazine for marginal BP - will stop hydralazine for now and consolidate lopressor to 25 mg BID - still waiting for CMP to result this morning for further decisions regarding GDMT - is not volume up   Acute on chronic renal insufficiency - baseline sCr may be near 1.2-1.5 - has peaked at 2.29 this admission, CMP pending for today   Atrial fibrillation - new diagnosis this admission - rate controlled with q6 h lopressor - will consolidate to 25 mg lopressor BID and monitor rate - heparin gtt running - will need to discuss eliquis after heart cath   Need for chronic anticoagulation - should he have PCI, will likely need triple therapy for 30 days   CXR concerning for PNA Leukocytosis - WBC 19.9 (17.9) -  procalcitonin yesterday 2.03 - will draw lactic acid - received fluids yesterday - on zosyn, which should cover HCAP and UTI - continues with cough and congestion   Elevated LFTs - CMP pending today - felt related to heart failure   DM - SSI   If studies unrevealing today, may need to involve medicine service  If renal function not improved, may need  a RHC to evaluate volume status and nephrology consult.   For questions or updates, please contact [facility] Please consult [email or web address] for contact info under        Signed, Marcelino Duster, PA  02/28/2023, 8:20 AM    Patient seen, examined. Available data reviewed. Agree with findings, assessment, and plan as outlined by Bettina Gavia, PA. The patient is independently interviewed and examined. On my exam: Vitals:   02/28/23 0756 02/28/23 1141  BP: 110/67 (!) 113/57  Pulse: 78 77  Resp: 16 16  Temp: 97.7 F (36.5 C) 98.4 F (36.9 C)  SpO2: 98% 97%   Pt is alert and oriented, NAD HEENT: normal Neck: JVP - normal Lungs: CTA bilaterally CV: RRR without murmur or gallop Abd: soft, NT, Positive BS, no hepatomegaly Ext: no C/C/E, ecchymoses present on the arms Skin: warm/dry no rash  Multiple issues in this gentleman with NSTEMI and acute systolic heart failure. Baseline LVEF in 2022 is 60-65%, now in the setting of NSTEMI his LVEF is 25% with concern for LV apical thrombus - definity echo contrast study recommended will order. He has AKI and creatinine appears to be in plateau phase with today's creatinine 2.12 (trend 2.07-->2.01-->2.12). WBC 19.9 up from 17.9 yesterday but also relatively stable trend over the past week. Plan as follows: Continue IV heparin with plan to transition to DOAC post-cath Cath tomorrow if creatinine stable.  Will order diet today and make n.p.o. after midnight Medicine consult for help with management of non-cardiac issues (procalcitonin >2, leukocytosis, on broad spectrum IV abx  with concern for pneumonia) Continue current Rx with no additional diuresis today Normal lactate reassuring Patient's biggest complaint continues to be hallucinations and bad dreams.  Appreciate medicine consult Limited echo with Definity contrast for LV apical thrombus evaluation  Tonny Bollman, M.D. 02/28/2023 1:14 PM

## 2023-02-28 NOTE — H&P (Addendum)
Medical Consult  [facility] - [place] @ [facility] [place], D.O.    Patient Name: Nathan [name] MR#: [id number] Date of Birth: 17-Aug-1945 Date of Admission: 02/20/2023  Referring MD/NP/PA: Cardiology Primary Care Physician: Garlan Fillers, MD  Chief Complaint:  Chief Complaint  Patient presents with   Chest Pain  Please note the entire history is obtained from the patient's emergency department chart, emergency department provider and the patient's friend who is at the bedside. Patient's personal history is limited by poor historian and ?altered mental status.   HPI: Nathan [name] is a 77 y.o. male with a known history of Allergies, OA, s/pAVR 2001 , CAD s/p MI,, DM, GERD, HTN, HLD, OSA, chronic nightmares, presented to the emergency department and was admitted for evaluation of NSTEMI was planned for left heart cath which was not able to be done due to AKI, acute illness. During this hospitalization, in addition to his NSTEMI he has been treated for acute systolic CHF, was diagnosed with new onset atrial fibrillation, rate controlled. Has AKI with peak Cr at 2.29.  He is currently being treated with heparin.   During my discussion with the patient, his main complaint is hallucinations and nightmares that he has been having since he's been hospitalized as well as left ankle pain that started since he was admitted. He is not having any chest pain, SOB, cough/ congestion, fevers, chills.    His best friend at bedside says he is intermittently confused, but not sure whether he is joking or not.  He keeps telling us long stories about the hallucinations he is having.    Review of Systems:  CONSTITUTIONAL: No fever/chills, fatigue, weakness, weight gain/loss, headache. EYES: No blurry or double vision. ENT: No tinnitus, postnasal drip, redness or soreness of the oropharynx. RESPIRATORY: No cough, dyspnea, wheeze.  No hemoptysis.  CARDIOVASCULAR: No chest pain,  palpitations, syncope, orthopnea. No lower extremity edema.  GASTROINTESTINAL: No nausea, vomiting, abdominal pain, diarrhea, constipation.  No hematemesis, melena or hematochezia. GENITOURINARY: No dysuria, frequency, hematuria. ENDOCRINE: No polyuria or nocturia. No heat or cold intolerance. HEMATOLOGY: No anemia, bruising, bleeding. INTEGUMENTARY: No rashes, ulcers, lesions. MUSCULOSKELETAL: No arthritis, gout. NEUROLOGIC: No numbness, tingling, ataxia, seizure-type activity, weakness. PSYCHIATRIC: Positive hallucinations.  No anxiety, depression, insomnia.   Past Medical History:  Diagnosis Date   Allergy    Aortic stenosis    Arthritis    Balance problem 04/26/2022   Blood transfusion without reported diagnosis    CAD (coronary artery disease)    Cataract    both eyes surgically removed   Diabetes mellitus, type 2 (HCC)    Diverticulosis of colon (without mention of hemorrhage)    Duodenal ulcer perforation (HCC)    blood transfusion   Flesh-eating bacteria (HCC) 1995   GERD (gastroesophageal reflux disease)    Glaucoma    pre glaucoma on Xalatan drops   Heart murmur    History of diabetic retinopathy    History of gastrointestinal bleeding    History of necrotizing fasciitis    History of prosthetic aortic valve    Hypercholesteremia    Myocardial infarction (HCC) 02/23/2016   Neuropathic pain 04/26/2022   Nightmares    CHRONIC   OSA (obstructive sleep apnea)    borderline no CPAP    Personal history of colonic polyps 11/23/2009   TUBULAR ADENOMA   Postoperative anemia    Sinus bradycardia    Sleep apnea    no cpap   Stomach ulcer  Hx of   Systolic hypertension    Vitamin B12 deficiency     Past Surgical History:  Procedure Laterality Date   AORTIC VALVE REPLACEMENT     BLEPHAROPLASTY  11-24-14   CARDIAC CATHETERIZATION N/A 03/01/2016   Procedure: Coronary/Graft Angiography;  Surgeon: Peter M Swaziland, MD;  Location: [facility] INVASIVE CV LAB;  Service:  Cardiovascular;  Laterality: N/A;   CATARACT EXTRACTION W/ INTRAOCULAR LENS IMPLANT     OD   COLONOSCOPY     CORONARY ARTERY BYPASS GRAFT  2010   x 4   flesh eating disease surgery      surgeries x 7    HERNIA REPAIR  1972   POLYPECTOMY     RETINAL LASER PROCEDURE     SCROTAL SURGERY  1995   resection   UMBILICAL HERNIA REPAIR N/A 04/10/2013   Procedure: HERNIA REPAIR UMBILICAL ADULT with mesh;  Surgeon: Valarie Merino, MD;  Location: [facility] ORS;  Service: General;  Laterality: N/A;     reports that he has never smoked. He has never used smokeless tobacco. He reports that he does not drink alcohol and does not use drugs.  Allergies  Allergen Reactions   Shellfish-Derived Products Anaphylaxis   Zolpidem Tartrate Other (See Comments)    Hallucinations    Family History  Problem Relation Age of Onset   Cirrhosis Father    Alcohol abuse Father        father murdered   Rectal cancer Mother    Diabetes Mother    Colon cancer Mother 83       rectal cancer   Hypertension Mother    Hypertension Sister    Diabetes Sister    Heart attack Neg Hx    Stroke Neg Hx    Colon polyps Neg Hx    Esophageal cancer Neg Hx    Stomach cancer Neg Hx     Prior to Admission medications   Medication Sig Start Date End Date Taking? Authorizing Provider  allopurinol (ZYLOPRIM) 300 MG tablet Take 300 mg by mouth daily.   Yes [provider]  ascorbic acid (VITAMIN C) 500 MG tablet Take by mouth daily.   Yes [provider]  aspirin 81 MG tablet Take 1 tablet (81 mg total) by mouth daily. Patient taking differently: Take 81 mg by mouth at bedtime. 02/09/16  Yes Jake Bathe, MD  atorvastatin (LIPITOR) 20 MG tablet Take 20 mg by mouth daily. 07/25/15  Yes [provider]  Cholecalciferol (VITAMIN D-3 PO) Take 1 capsule by mouth in the morning and at bedtime.   Yes [provider]  Coenzyme Q10 (COQ10) 100 MG CAPS Take 100 mg by mouth daily.   Yes [provider]  fish oil-omega-3 fatty acids 1000 MG capsule Take by mouth daily.   Yes [provider]  glipiZIDE (GLUCOTROL) 5 MG tablet Take 5 mg by mouth every morning.   Yes [provider]  hydrochlorothiazide (MICROZIDE) 12.5 MG capsule Take 12.5 mg by mouth daily. Reported on 08/20/2015 10/13/10  Yes Cassell Clement, MD  isosorbide mononitrate (IMDUR) 30 MG 24 hr tablet TAKE 1 TABLET (30 MG TOTAL) BY MOUTH DAILY. **DO NOT CRUSH** 07/20/22  Yes Swinyer, Zachary George, NP  metFORMIN (GLUCOPHAGE) 1000 MG tablet Take 1,000 mg by mouth in the morning and at bedtime.   Yes [provider]  metoprolol (LOPRESSOR) 50 MG tablet Take 50 mg by mouth 2 (two) times daily.   Yes [provider]  minoxidil (  LONITEN) 10 MG tablet Take 10 mg by mouth daily.  07/20/15  Yes [provider]  VITAMIN A PO Take by mouth daily.   Yes [provider]  acetaminophen (TYLENOL) 500 MG tablet Take 500 mg by mouth daily as needed for moderate pain. Patient not taking: Reported on 02/23/2023    [provider]  ALPRAZolam Prudy Feeler) 0.25 MG tablet Take 0.25 mg by mouth daily as needed for anxiety. Patient not taking: Reported on 02/23/2023    [provider]  amLODipine (NORVASC) 5 MG tablet Take 5 mg by mouth every morning. Patient not taking: Reported on 02/23/2023    [provider]  amoxicillin-clavulanate (AUGMENTIN) 875-125 MG tablet Take 1 tablet by mouth every 12 (twelve) hours. Patient not taking: Reported on 02/23/2023 01/21/23   Evlyn Kanner T, PA-C  Cyanocobalamin (VITAMIN B-12 IJ) Inject 1,000 mcg into the muscle every 30 (thirty) days. Amt/dose is unknown Patient not taking: Reported on 02/23/2023    [provider]  cycloSPORINE (RESTASIS) 0.05 % ophthalmic emulsion Place 1 drop into both eyes 2 (two) times daily. Patient not taking: Reported on 02/23/2023    [provider]  fluorouracil (EFUDEX) 5 % cream Apply  topically daily. Patient not taking: Reported on 02/23/2023 06/10/22   [provider]  HYDROcodone-acetaminophen (VICODIN) 5-500 MG per tablet Take 1 tablet by mouth daily as needed for pain.  Patient not taking: Reported on 02/23/2023    [provider]  ketoconazole (NIZORAL) 2 % cream 2 (two) times daily. Patient not taking: Reported on 02/23/2023 06/10/22   [provider]  meloxicam (MOBIC) 15 MG tablet Take 15 mg by mouth as needed for pain. Patient not taking: Reported on 02/23/2023 05/26/22   [provider]  Multiple Vitamin (MULTIVITAMIN) tablet Take 1 tablet by mouth daily. Patient not taking: Reported on 02/23/2023    [provider]  nitroGLYCERIN (NITROSTAT) 0.4 MG SL tablet Place 1 tablet under the tongue as needed. Patient not taking: Reported on 02/23/2023 09/01/16   [provider]  oxyCODONE-acetaminophen (PERCOCET/ROXICET) 5-325 MG tablet Take 1 tablet by mouth daily as needed for moderate pain or severe pain.  Patient not taking: Reported on 02/23/2023    [provider]  trolamine salicylate (ASPERCREME) 10 % cream Apply 1 application topically as needed for muscle pain. Patient not taking: Reported on 02/23/2023    [provider]    Physical Exam: Vitals:   02/28/23 0332 02/28/23 0504 02/28/23 0756 02/28/23 1141  BP: 124/68 (!) 116/95 110/67 (!) 113/57  Pulse: 75 83 78 77  Resp: 16  16 16   Temp: 98 F (36.7 C)  97.7 F (36.5 C) 98.4 F (36.9 C)  TempSrc: Oral  Oral Oral  SpO2:   98% 97%  Weight:      Height:        GENERAL: 77 y.o.-year-old white male patient, well-developed, well-nourished lying in the bed in no acute distress, seems anxious/fearful.  HEENT: Head atraumatic, normocephalic. Pupils equal. Mucus membranes moist. NECK: Supple. No JVD. CHEST: Normal breath sounds bilaterally. No wheezing, rales, rhonchi or crackles. No use of accessory muscles of respiration.  No reproducible chest  wall tenderness.  CARDIOVASCULAR: S1, S2 normal. No murmurs, rubs, or gallops. Cap refill <2 seconds. Pulses intact distally.  ABDOMEN: Soft, nondistended, nontender. No rebound, guarding, rigidity. Normoactive bowel sounds present in all four quadrants.  EXTREMITIES: Multiple areas of ecchymoses and crusted, healing punctate lesions on anterior forearms.  No pedal edema, cyanosis,  or clubbing. No calf tenderness or Homan's sign.  NEUROLOGIC: The patient is alert and oriented x 3. Cranial nerves II through XII are grossly intact with no focal sensorimotor deficit. PSYCHIATRIC:  Normal affect, mood, thought content. SKIN: Warm, dry, and intact without obvious rash, lesion, or ulcer.    Labs on Admission:  CBC: Recent Labs  Lab 02/23/23 2335 02/25/23 0749 02/26/23 0141 02/27/23 0727 02/28/23 0659  WBC 17.6* 18.0* 17.0* 17.9* 19.9*  HGB 10.2* 10.5* 10.2* 9.8* 8.5*  HCT 29.8* 30.5* 28.5* 27.5* 24.0*  MCV 82.3 82.2 78.9* 79.0* 80.8  PLT 168 146* 173 167 202   Basic Metabolic Panel: Recent Labs  Lab 02/22/23 1055 02/23/23 0002 02/23/23 2335 02/25/23 0749 02/26/23 0141 02/27/23 0727 02/28/23 0659  NA  --  137 134* 134* 132* 135 138  K  --  4.7 3.5 3.5 3.8 3.3* 3.3*  CL  --  103 99 97* 98 98 101  CO2  --  20* 21* 18* 19* 18* 20*  GLUCOSE  --  178* 115* 198* 202* 182* 155*  BUN  --  44* 50* 62* 64* 54* 48*  CREATININE  --  2.13* 1.99* 2.29* 2.07* 2.01* 2.12*  CALCIUM  --  8.6* 8.5* 8.4* 8.4* 8.4* 8.4*  MG 1.2* 2.2 1.7 2.0  --   --   --    GFR: Estimated Creatinine Clearance: 27.3 mL/min (A) (by C-G formula based on SCr of 2.12 mg/dL (H)). Liver Function Tests: Recent Labs  Lab 02/23/23 2335 02/25/23 0749 02/26/23 0141 02/27/23 0727 02/28/23 0659  AST 82* 73* 94* 67* 72*  ALT 56* 60* 72* 63* 58*  ALKPHOS 75 95 90 79 81  BILITOT 2.6* 2.7* 2.1* 2.5* 2.5*  PROT 5.8* 6.0* 5.8* 6.0* 5.8*  ALBUMIN 2.6* 2.6* 2.4* 2.3* 2.2*   No results for input(s): "LIPASE", "AMYLASE" in  the last 168 hours. No results for input(s): "AMMONIA" in the last 168 hours. Coagulation Profile: No results for input(s): "INR", "PROTIME" in the last 168 hours. Cardiac Enzymes: No results for input(s): "CKTOTAL", "CKMB", "CKMBINDEX", "TROPONINI" in the last 168 hours. BNP (last 3 results) No results for input(s): "PROBNP" in the last 8760 hours. HbA1C: No results for input(s): "HGBA1C" in the last 72 hours. CBG: Recent Labs  Lab 02/27/23 1142 02/27/23 1616 02/27/23 2029 02/28/23 0752 02/28/23 1139  GLUCAP 206* 155* 168* 170* 140*   Lipid Profile: No results for input(s): "CHOL", "HDL", "LDLCALC", "TRIG", "CHOLHDL", "LDLDIRECT" in the last 72 hours. Thyroid Function Tests: No results for input(s): "TSH", "T4TOTAL", "FREET4", "T3FREE", "THYROIDAB" in the last 72 hours. Anemia Panel: No results for input(s): "VITAMINB12", "FOLATE", "FERRITIN", "TIBC", "IRON", "RETICCTPCT" in the last 72 hours. Urine analysis:    Component Value Date/Time   COLORURINE YELLOW 02/28/2023 0951   APPEARANCEUR HAZY (A) 02/28/2023 0951   LABSPEC 1.016 02/28/2023 0951   PHURINE 5.0 02/28/2023 0951   GLUCOSEU NEGATIVE 02/28/2023 0951   HGBUR MODERATE (A) 02/28/2023 0951   BILIRUBINUR NEGATIVE 02/28/2023 0951   KETONESUR 20 (A) 02/28/2023 0951   PROTEINUR NEGATIVE 02/28/2023 0951   UROBILINOGEN 0.2 10/16/2008 1546   NITRITE NEGATIVE 02/28/2023 0951   LEUKOCYTESUR NEGATIVE 02/28/2023 0951   Sepsis Labs: @LABRCNTIP (procalcitonin:4,lacticidven:4) ) Recent Results (from the past 240 hour(s))  MRSA Next Gen by PCR, Nasal     Status: None   Collection Time: 02/21/23 10:14 PM   Specimen: Nasal Mucosa; Nasal Swab  Result Value Ref Range Status   MRSA by PCR Next Gen NOT DETECTED  NOT DETECTED Final    Comment: (NOTE) The GeneXpert MRSA Assay (FDA approved for NASAL specimens only), is one component of a comprehensive MRSA colonization surveillance program. It is not intended to diagnose MRSA  infection nor to guide or monitor treatment for MRSA infections. Test performance is not FDA approved in patients less than 66 years old. Performed at [facility], [address], [place], [place] [address]   Culture, blood (Routine X 2) w Reflex to ID Panel     Status: None (Preliminary result)   Collection Time: 02/25/23  9:53 AM   Specimen: BLOOD RIGHT HAND  Result Value Ref Range Status   Specimen Description BLOOD RIGHT HAND  Final   Special Requests   Final    BOTTLES DRAWN AEROBIC ONLY Blood Culture adequate volume   Culture   Final    NO GROWTH 3 DAYS Performed at [facility], [address] [place], [place] [address]    Report Status PENDING  Incomplete  Culture, blood (Routine X 2) w Reflex to ID Panel     Status: None (Preliminary result)   Collection Time: 02/25/23  9:56 AM   Specimen: BLOOD RIGHT HAND  Result Value Ref Range Status   Specimen Description BLOOD RIGHT HAND  Final   Special Requests   Final    BOTTLES DRAWN AEROBIC ONLY Blood Culture adequate volume   Culture   Final    NO GROWTH 3 DAYS Performed at [facility], [address], [place], [place] [address]    Report Status PENDING  Incomplete  Urine Culture (for pregnant, neutropenic or urologic patients or patients with an indwelling urinary catheter)     Status: None   Collection Time: 02/27/23  7:45 AM   Specimen: Urine, Clean Catch  Result Value Ref Range Status   Specimen Description URINE, CLEAN CATCH  Final   Special Requests NONE  Final   Culture   Final    NO GROWTH Performed at [facility], [address], [place], [place] [address]    Report Status 02/28/2023 FINAL  Final     Radiological Exams on Admission: DG Chest 2 View  Result Date: 02/28/2023 CLINICAL DATA:  Pneumonia. EXAM: CHEST - 2 VIEW COMPARISON:  February 25, 2023. FINDINGS: Stable cardiomegaly. Status post coronary artery bypass graft and aortic valve repair. Lungs are clear. Old left rib  fractures are noted. IMPRESSION: No active cardiopulmonary disease. Electronically Signed   By: Lupita Raider M.D.   On: 02/28/2023 13:45    EKG: Afib in the 70s  ECHO: EF 25-30%.   Assessment/Plan  This is a 77 y.o. male with a history of Allergies, OA, s/pAVR 2001 , CAD s/p MI,, DM, GERD, HTN, HLD, OSA, chronic nightmares, now being admitted with:  #. Altered mental status / encephalopathy - ?Infectious, metabolic, medication - Check head CT - Add sepsis protocol without fluids - trend lactate, Add Vanco, check hepatitis panel, resp viral panel - Expand Abx coverage for unclear source considering WBC count, procal - Check blood and sputum culture - Consider ID consult - Contacted Dr. Thedore Mins.   #. AKI on CKD - Would obtain nephro consult for comanagement  #. NSTEMI / CHF / afib - Management per cardiology - Continue heparin, lopressor, imdur  #. H/o Diabetes - Continue Accuchecks achs with RISS coverage  All the records are reviewed and case discussed with cardiology PA.  Management plans discussed with the patient and/or family  who express understanding and agree with plan of care.  Tonye Royalty D.O. on 02/28/2023 at 3:42 PM CC: Primary care physician; Garlan Fillers, MD   02/28/2023, 3:42 PM

## 2023-03-01 ENCOUNTER — Inpatient Hospital Stay (HOSPITAL_COMMUNITY): Payer: Medicare Other

## 2023-03-01 ENCOUNTER — Encounter (HOSPITAL_COMMUNITY)
Admission: EM | Disposition: A | Discharge status: Skilled Nursing Facility | Payer: Self-pay | Source: Home / Self Care | Attending: Cardiovascular Disease

## 2023-03-01 ENCOUNTER — Other Ambulatory Visit (HOSPITAL_COMMUNITY): Payer: Self-pay

## 2023-03-01 DIAGNOSIS — R079 Chest pain, unspecified: Secondary | ICD-10-CM | POA: Diagnosis not present

## 2023-03-01 DIAGNOSIS — I5043 Acute on chronic combined systolic (congestive) and diastolic (congestive) heart failure: Secondary | ICD-10-CM | POA: Diagnosis not present

## 2023-03-01 DIAGNOSIS — N179 Acute kidney failure, unspecified: Secondary | ICD-10-CM

## 2023-03-01 DIAGNOSIS — N183 Chronic kidney disease, stage 3 unspecified: Secondary | ICD-10-CM | POA: Insufficient documentation

## 2023-03-01 DIAGNOSIS — K810 Acute cholecystitis: Secondary | ICD-10-CM

## 2023-03-01 DIAGNOSIS — I249 Acute ischemic heart disease, unspecified: Secondary | ICD-10-CM | POA: Diagnosis not present

## 2023-03-01 DIAGNOSIS — N1831 Chronic kidney disease, stage 3a: Secondary | ICD-10-CM | POA: Diagnosis not present

## 2023-03-01 LAB — BASIC METABOLIC PANEL
Anion gap: 14 (ref 5–15)
BUN: 45 mg/dL — ABNORMAL HIGH (ref 8–23)
CO2: 20 mmol/L — ABNORMAL LOW (ref 22–32)
Calcium: 7.9 mg/dL — ABNORMAL LOW (ref 8.9–10.3)
Chloride: 102 mmol/L (ref 98–111)
Creatinine, Ser: 1.92 mg/dL — ABNORMAL HIGH (ref 0.61–1.24)
GFR, Estimated: 35 mL/min — ABNORMAL LOW (ref >60)
Glucose, Bld: 143 mg/dL — ABNORMAL HIGH (ref 70–99)
Potassium: 2.8 mmol/L — ABNORMAL LOW (ref 3.5–5.1)
Sodium: 136 mmol/L (ref 135–145)

## 2023-03-01 LAB — SARS CORONAVIRUS 2 BY RT PCR: SARS Coronavirus 2 by RT PCR: NEGATIVE

## 2023-03-01 LAB — ECHOCARDIOGRAM LIMITED
Calc EF: 30.8 %
Height: 67 in
S' Lateral: 4.4 cm
Single Plane A2C EF: 28 %
Single Plane A4C EF: 30.9 %
Weight: 2423.3 [oz_av]

## 2023-03-01 LAB — GLUCOSE, CAPILLARY
Glucose-Capillary: 159 mg/dL — ABNORMAL HIGH (ref 70–99)
Glucose-Capillary: 162 mg/dL — ABNORMAL HIGH (ref 70–99)
Glucose-Capillary: 165 mg/dL — ABNORMAL HIGH (ref 70–99)
Glucose-Capillary: 169 mg/dL — ABNORMAL HIGH (ref 70–99)
Glucose-Capillary: 188 mg/dL — ABNORMAL HIGH (ref 70–99)

## 2023-03-01 LAB — HEPARIN LEVEL (UNFRACTIONATED)
Heparin Unfractionated: 0.96 [IU]/mL — ABNORMAL HIGH (ref 0.30–0.70)
Heparin Unfractionated: 0.96 [IU]/mL — ABNORMAL HIGH (ref 0.30–0.70)

## 2023-03-01 LAB — LACTIC ACID, PLASMA
Lactic Acid, Venous: 1.5 mmol/L (ref 0.5–1.9)
Lactic Acid, Venous: 1.6 mmol/L (ref 0.5–1.9)

## 2023-03-01 LAB — CBC
HCT: 20.5 % — ABNORMAL LOW (ref 39.0–52.0)
Hemoglobin: 7.4 g/dL — ABNORMAL LOW (ref 13.0–17.0)
MCH: 29.5 pg (ref 26.0–34.0)
MCHC: 36.1 g/dL — ABNORMAL HIGH (ref 30.0–36.0)
MCV: 81.7 fL (ref 80.0–100.0)
Platelets: 184 10*3/uL (ref 150–400)
RBC: 2.51 MIL/uL — ABNORMAL LOW (ref 4.22–5.81)
RDW: 17.2 % — ABNORMAL HIGH (ref 11.5–15.5)
WBC: 19.6 10*3/uL — ABNORMAL HIGH (ref 4.0–10.5)
nRBC: 0.3 % — ABNORMAL HIGH (ref 0.0–0.2)

## 2023-03-01 LAB — HIV ANTIBODY (ROUTINE TESTING W REFLEX): HIV Screen 4th Generation wRfx: NONREACTIVE

## 2023-03-01 SURGERY — RIGHT/LEFT HEART CATH AND CORONARY ANGIOGRAPHY
Anesthesia: LOCAL

## 2023-03-01 MED ORDER — POTASSIUM CHLORIDE CRYS ER 20 MEQ PO TBCR
40.0000 meq | EXTENDED_RELEASE_TABLET | ORAL | Status: AC
  Administered 2023-03-01 (×2): 40 meq via ORAL
  Filled 2023-03-01 (×2): qty 2

## 2023-03-01 MED ORDER — INSULIN ASPART 100 UNIT/ML IJ SOLN
0.0000 [IU] | INTRAMUSCULAR | Status: DC
  Administered 2023-03-01 – 2023-03-02 (×4): 2 [IU] via SUBCUTANEOUS
  Administered 2023-03-02: 3 [IU] via SUBCUTANEOUS
  Administered 2023-03-02 – 2023-03-03 (×3): 2 [IU] via SUBCUTANEOUS
  Administered 2023-03-03: 5 [IU] via SUBCUTANEOUS
  Administered 2023-03-03 (×3): 1 [IU] via SUBCUTANEOUS
  Administered 2023-03-03: 3 [IU] via SUBCUTANEOUS
  Administered 2023-03-03: 5 [IU] via SUBCUTANEOUS
  Administered 2023-03-04 – 2023-03-05 (×6): 2 [IU] via SUBCUTANEOUS
  Administered 2023-03-05 (×2): 3 [IU] via SUBCUTANEOUS
  Administered 2023-03-06 (×2): 1 [IU] via SUBCUTANEOUS
  Administered 2023-03-06: 2 [IU] via SUBCUTANEOUS

## 2023-03-01 NOTE — Plan of Care (Signed)
  Problem: Activity: Goal: Ability to tolerate increased activity will improve Outcome: Progressing   Problem: Cardiac: Goal: Ability to achieve and maintain adequate cardiovascular perfusion will improve Outcome: Progressing   Problem: Fluid Volume: Goal: Ability to maintain a balanced intake and output will improve Outcome: Progressing   Problem: Metabolic: Goal: Ability to maintain appropriate glucose levels will improve Outcome: Progressing   Problem: Nutritional: Goal: Progress toward achieving an optimal weight will improve Outcome: Progressing   Problem: Skin Integrity: Goal: Risk for impaired skin integrity will decrease Outcome: Progressing   Problem: Activity: Goal: Ability to return to baseline activity level will improve Outcome: Progressing   Problem: Clinical Measurements: Goal: Ability to maintain clinical measurements within normal limits will improve Outcome: Progressing Goal: Will remain free from infection Outcome: Progressing Goal: Diagnostic test results will improve Outcome: Progressing Goal: Respiratory complications will improve Outcome: Progressing   Problem: Activity: Goal: Risk for activity intolerance will decrease Outcome: Progressing   Problem: Elimination: Goal: Will not experience complications related to urinary retention Outcome: Progressing   Problem: Pain Managment: Goal: General experience of comfort will improve Outcome: Progressing   Problem: Education: Goal: Understanding of cardiac disease, CV risk reduction, and recovery process will improve Outcome: Not Progressing   Problem: Health Behavior/Discharge Planning: Goal: Ability to safely manage health-related needs after discharge will improve Outcome: Not Progressing   Problem: Coping: Goal: Ability to adjust to condition or change in health will improve Outcome: Not Progressing   Problem: Health Behavior/Discharge Planning: Goal: Ability to identify and utilize  available resources and services will improve Outcome: Not Progressing Goal: Ability to manage health-related needs will improve Outcome: Not Progressing   Problem: Nutritional: Goal: Maintenance of adequate nutrition will improve Outcome: Not Progressing   Problem: Education: Goal: Understanding of CV disease, CV risk reduction, and recovery process will improve Outcome: Not Progressing   Problem: Elimination: Goal: Will not experience complications related to bowel motility Outcome: Not Progressing

## 2023-03-01 NOTE — Progress Notes (Signed)
Physical Therapy Treatment Patient Details Name: Nathan [name] MRN: [id number] DOB: October 28, 1945 Today's Date: 03/01/2023   History of Present Illness Pt is 77 yo male presented on 02/20/23 with chest pain and found to have NSTEMI.  Pt was scheduled to undergo cardiac cath but developed overload/CHF requiring transfer to ICU. Pt with complication 8/21 of sepsis and antibiotics changed.  Also pending work up for COVID infection and placed on COVID precautions.   PMH:  CAD s/p CABGx3 in 2010, aortic valve replacement, DM, HTN, HLD, MI, OSA    PT Comments  Pt admitted with above diagnosis. Pt was able to ambulate with RW with min assist as pt needs continued cues for safety with RW as he tends to flex posture and has overall poor safety awareness.  Pt also confused overnight even though oriented with PT today.  Will continue to progress toward goals. Pt currently with functional limitations due to the deficits listed below (see PT Problem List). Pt will benefit from acute skilled PT to increase their independence and safety with mobility to allow discharge.       If plan is discharge home, recommend the following: A little help with walking and/or transfers;A little help with bathing/dressing/bathroom;Assistance with cooking/housework;Help with stairs or ramp for entrance   Can travel by private vehicle        Equipment Recommendations  Rolling walker (2 wheels);Other (comment) (issued gait belt)    Recommendations for Other Services       Precautions / Restrictions Precautions Precautions: Fall Restrictions Weight Bearing Restrictions: No     Mobility  Bed Mobility Overal bed mobility: Needs Assistance Bed Mobility: Supine to Sit     Supine to sit: Contact guard     General bed mobility comments: No assist needed just incr time to come to eOB.    Transfers Overall transfer level: Needs assistance Equipment used: Rolling walker (2 wheels) Transfers: Sit to/from Stand Sit to  Stand: Min assist           General transfer comment: Needed assist to stand to RW and cues for hand placement. Pt rocking and used momentum to stand.    Ambulation/Gait Ambulation/Gait assistance: Min assist Gait Distance (Feet): 125 Feet Assistive device: Rolling walker (2 wheels) Gait Pattern/deviations: Step-through pattern, Decreased stride length, Trunk flexed, Wide base of support, Drifts right/left, Leaning posteriorly Gait velocity: decreased Gait velocity interpretation: 1.31 - 2.62 ft/sec, indicative of limited community ambulator   General Gait Details: Min assist  to ambulate with RW for safety as pt tends to need cues to stay close to RW and for upright stance as he leans forward as he fatigues.  Pt needs cues to slow down as he moves impulsively at times.   Stairs             Wheelchair Mobility     Tilt Bed    Modified Rankin (Stroke Patients Only)       Balance Overall balance assessment: Needs assistance Sitting-balance support: No upper extremity supported Sitting balance-Leahy Scale: Good     Standing balance support: Bilateral upper extremity supported, Reliant on assistive device for balance Standing balance-Leahy Scale: Poor Standing balance comment: Requiring RW and min assist                            Cognition Arousal: Alert Behavior During Therapy: Flat affect Overall Cognitive Status: No family/caregiver present to determine baseline cognitive functioning Area of Impairment: Problem solving  Problem Solving: Slow processing          Exercises General Exercises - Lower Extremity Long Arc Quad: AROM, Both, 10 reps, Seated Hip Flexion/Marching: AROM, Both, 10 reps, Seated    General Comments General comments (skin integrity, edema, etc.): VSS      Pertinent Vitals/Pain Pain Assessment Pain Assessment: Faces Faces Pain Scale: Hurts little more Pain Location: Bil  LE Pain Descriptors / Indicators: Discomfort, Grimacing Pain Intervention(s): Limited activity within patient's tolerance, Monitored during session, Repositioned    Home Living                          Prior Function            PT Goals (current goals can now be found in the care plan section) Acute Rehab PT Goals Patient Stated Goal: return home Progress towards PT goals: Progressing toward goals    Frequency    Min 1X/week      PT Plan      Co-evaluation              AM-PAC PT "6 Clicks" Mobility   Outcome Measure  Help needed turning from your back to your side while in a flat bed without using bedrails?: A Little Help needed moving from lying on your back to sitting on the side of a flat bed without using bedrails?: A Little Help needed moving to and from a bed to a chair (including a wheelchair)?: A Little Help needed standing up from a chair using your arms (e.g., wheelchair or bedside chair)?: A Little Help needed to walk in hospital room?: A Little Help needed climbing 3-5 steps with a railing? : A Lot 6 Click Score: 17    End of Session Equipment Utilized During Treatment: Gait belt Activity Tolerance: Patient limited by fatigue Patient left: with call bell/phone within reach;in chair;with chair alarm set Nurse Communication: Mobility status PT Visit Diagnosis: Other abnormalities of gait and mobility (R26.89);Muscle weakness (generalized) (M62.81)     Time: 1136-1202 PT Time Calculation (min) (ACUTE ONLY): 26 min  Charges:    $Gait Training: 8-22 mins $Therapeutic Exercise: 8-22 mins PT General Charges $$ ACUTE PT VISIT: 1 Visit                     Amro Winebarger M,PT Acute Rehab Services [phone]    Bevelyn Buckles 03/01/2023, 2:05 PM

## 2023-03-01 NOTE — Consult Note (Addendum)
[facility]    Date of Admission:  02/20/2023           Reason for Consult: Acute encephalopathy    Principal Problem:   Chest pain Active Problems:   S/P AVR   Acute on chronic combined systolic and diastolic CHF (congestive heart failure) (HCC)   Respiratory distress   Altered mental status   AKI (acute kidney injury) (HCC)   CKD stage 3a, GFR 45-59 ml/min [facility])   Assessment: 77 year old male with history of hallucinations, diabetes A1c 6.1, admitted with NSTEMI found to have new onset A-fib.  He had hallucinations or hospitalization message ID engaged for possible infectious etiology. #Intermittent confusion with history of hallucinations. #Leukocytosis -Patient was able to provide history today.  He does note an odd history of flesh eating bacteria on his legs, although no scars seen. - Etiology of his leukocytosis is unclear as blood cultures from 8/17 have been with no growth.  Pt denies shortness of breath, cough, abdominal pain, nausea vomiting diarrhea. Recommendations:  -CT chest abdomen pelvis - If blood cultures negative and CT unrevealing stop antibiotics. - Patient has a long history of hallucinations, I do not think it is infectious - HIV ab - Acute hep panel was negative from 8/20 - COVID testing(neagtive on 8/21) - Continue to monitor WBC, unclear if it is related to NSTEMI.  No signs of phlebitis seen on skin review.  #AKI Microbiology:   Antibiotics:  Pip-tazo 8/18-present Vancomycin Metronidazole Cultures: Blood 8/17 no growth 8/20 pending Urine 8/19 no growth    HPI: Nathan [name] is a 77 y.o. male history of allergies, awake, status post AVR 2001, CAD status post MI, diabetes mellitus, GERD, hypertension, hyperlipidemia, OSA, chronic nightmares admitted for NSTEMI with planned left heart Which was not done due to AKI, acute illness on 8/12.  During hospitalization found to have new onset A-fib as well.  On  heparin drip.  Patient has been intermittently confused, with history of hallucinations.  ID engaged for possible infectious etiologies for encephalopathy.   Review of Systems: Review of Systems  All other systems reviewed and are negative.   Past Medical History:  Diagnosis Date   Allergy    Aortic stenosis    Arthritis    Balance problem 04/26/2022   Blood transfusion without reported diagnosis    CAD (coronary artery disease)    Cataract    both eyes surgically removed   Diabetes mellitus, type 2 (HCC)    Diverticulosis of colon (without mention of hemorrhage)    Duodenal ulcer perforation (HCC)    blood transfusion   Flesh-eating bacteria (HCC) 1995   GERD (gastroesophageal reflux disease)    Glaucoma    pre glaucoma on Xalatan drops   Heart murmur    History of diabetic retinopathy    History of gastrointestinal bleeding    History of necrotizing fasciitis    History of prosthetic aortic valve    Hypercholesteremia    Myocardial infarction (HCC) 02/23/2016   Neuropathic pain 04/26/2022   Nightmares    CHRONIC   OSA (obstructive sleep apnea)    borderline no CPAP    Personal history of colonic polyps 11/23/2009   TUBULAR ADENOMA   Postoperative anemia    Sinus bradycardia    Sleep apnea    no cpap   Stomach ulcer    Hx of   Systolic hypertension    Vitamin B12 deficiency  Social History   Tobacco Use   Smoking status: Never   Smokeless tobacco: Never  Vaping Use   Vaping status: Never Used  Substance Use Topics   Alcohol use: No    Alcohol/week: 0.0 standard drinks of alcohol   Drug use: No    Family History  Problem Relation Age of Onset   Cirrhosis Father    Alcohol abuse Father        father murdered   Rectal cancer Mother    Diabetes Mother    Colon cancer Mother 83       rectal cancer   Hypertension Mother    Hypertension Sister    Diabetes Sister    Heart attack Neg Hx    Stroke Neg Hx    Colon polyps Neg Hx    Esophageal  cancer Neg Hx    Stomach cancer Neg Hx    Scheduled Meds:  allopurinol  300 mg Oral Daily   vitamin C  1,000 mg Oral Daily   aspirin EC  81 mg Oral Daily   atorvastatin  80 mg Oral Daily   docusate sodium  100 mg Oral BID   insulin aspart  0-15 Units Subcutaneous TID WC   isosorbide mononitrate  30 mg Oral Daily   melatonin  5 mg Oral QHS   metoprolol tartrate  25 mg Oral BID   polyethylene glycol  17 g Oral Daily   Continuous Infusions:  sodium chloride Stopped (02/24/23 0824)   ceFEPime (MAXIPIME) IV     heparin 1,200 Units/hr (03/01/23 0526)   metronidazole 500 mg (03/01/23 0436)   [START ON 03/02/2023] vancomycin     PRN Meds:.sodium chloride, acetaminophen, nitroGLYCERIN, mouth rinse, oxyCODONE-acetaminophen, senna-docusate Allergies  Allergen Reactions   Shellfish-Derived Products Anaphylaxis   Zolpidem Tartrate Other (See Comments)    Hallucinations    OBJECTIVE: Blood pressure 90/62, pulse 82, temperature 97.6 F (36.4 C), temperature source Oral, resp. rate 16, height 5\' 7"  (1.702 m), weight 68.7 kg, SpO2 98%.  Physical Exam Constitutional:      General: He is not in acute distress.    Appearance: He is normal weight. He is not toxic-appearing.  HENT:     Head: Normocephalic and atraumatic.     Right Ear: External ear normal.     Left Ear: External ear normal.     Nose: No congestion or rhinorrhea.     Mouth/Throat:     Mouth: Mucous membranes are moist.     Pharynx: Oropharynx is clear.  Eyes:     Extraocular Movements: Extraocular movements intact.     Conjunctiva/sclera: Conjunctivae normal.     Pupils: Pupils are equal, round, and reactive to light.  Cardiovascular:     Rate and Rhythm: Normal rate and regular rhythm.     Heart sounds: No murmur heard.    No friction rub. No gallop.  Pulmonary:     Effort: Pulmonary effort is normal.     Breath sounds: Normal breath sounds.  Abdominal:     General: Abdomen is flat. Bowel sounds are normal.      Palpations: Abdomen is soft.  Musculoskeletal:        General: No swelling. Normal range of motion.     Cervical back: Normal range of motion and neck supple.  Skin:    General: Skin is warm and dry.  Psychiatric:        Mood and Affect: Mood normal.     Lab Results Lab Results  Component  Value Date   WBC 19.6 (H) 03/01/2023   HGB 7.4 (L) 03/01/2023   HCT 20.5 (L) 03/01/2023   MCV 81.7 03/01/2023   PLT 184 03/01/2023    Lab Results  Component Value Date   CREATININE 1.92 (H) 03/01/2023   BUN 45 (H) 03/01/2023   NA 136 03/01/2023   K 2.8 (L) 03/01/2023   CL 102 03/01/2023   CO2 20 (L) 03/01/2023    Lab Results  Component Value Date   ALT 60 (H) 02/28/2023   AST 73 (H) 02/28/2023   ALKPHOS 74 02/28/2023   BILITOT 2.4 (H) 02/28/2023       Danelle Earthly, MD [facility] 03/01/2023, 2:38 PM  I have personally spent 82 minutes involved in face-to-face and non-face-to-face activities for this patient on the day of the visit. Professional time spent includes the following activities: Preparing to see the patient (review of tests), Obtaining and/or reviewing separately obtained history (admission/discharge record), Performing a medically appropriate examination and/or evaluation , Ordering medications/tests/procedures, referring and communicating with other health care professionals, Documenting clinical information in the EMR, Independently interpreting results (not separately reported), Communicating results to the patient/family/caregiver, Counseling and educating the patient/family/caregiver and Care coordination (not separately reported).

## 2023-03-01 NOTE — Progress Notes (Addendum)
Notified by radiology that CT findings are concerning for acute cholecystitis. Patient is already receiving cefepime and Flagyl.   Routine surgery consult was requested and patient made NPO. Surgery will see him tomorrow and requests [place]. RUQ [place] ordered.

## 2023-03-01 NOTE — Progress Notes (Addendum)
ANTICOAGULATION CONSULT NOTE- Follow Up  Pharmacy Consult for Heparin  Indication: chest pain/ACS  Allergies  Allergen Reactions   Shellfish-Derived Products Anaphylaxis   Zolpidem Tartrate Other (See Comments)    Hallucinations    Patient Measurements: Height: 5\' 7"  (170.2 cm) Weight: 68.7 kg (151 lb 7.3 oz) IBW/kg (Calculated) : 66.1 Heparin DW = 68 kg  Vital Signs: Temp: 97.6 F (36.4 C) (08/21 1215) Temp Source: Oral (08/21 1215) BP: 90/62 (08/21 1215) Pulse Rate: 82 (08/21 1215)  Labs: Recent Labs    02/28/23 0659 02/28/23 1644 03/01/23 0248 03/01/23 1235  HGB 8.5* 8.2* 7.4*  --   HCT 24.0* 22.9* 20.5*  --   PLT 202 215 184  --   HEPARINUNFRC 0.52  --  0.96* 0.96*  CREATININE 2.12* 1.96* 1.92*  --     Estimated Creatinine Clearance: 30.1 mL/min (A) (by C-G formula based on SCr of 1.92 mg/dL (H)).   Assessment: 77 y/o M with chest pain at rest beginning 8/11, unrelieved by SL NTG, some help with morphine.  Unable to get LHC on 8/14 due to agitation, now awaiting improvement in renal function.  No anticoagulation PTA, note potential LV thrombus but unable to be confirm due to inability to receive contrast.  8/16 ECHO noted inability to exclude thrombus.   -heparin level remains 0.96 on 1200 units/hr -RN reported slight nose bleed this morning but it has resolved -plans for [facility] when he is more stable  Goal of Therapy:  Heparin level 0.3-0.7 units/ml Monitor platelets by anticoagulation protocol: Yes   Plan:  -Decrease heparin to 1000 units/hr -Heparin level in 8 hours and daily wth CBC daily  Harland German, PharmD Clinical Pharmacist **Pharmacist phone directory can now be found on [email or web address] ([email or web address]).  Listed under [facility].

## 2023-03-01 NOTE — Assessment & Plan Note (Deleted)
Baseline 1.3-1.5.  Here, 1.9 on admission, up to 2.3.  Unchanged today at 1.9

## 2023-03-01 NOTE — Progress Notes (Addendum)
Rounding Note    Patient Name: Nathan [name] Date of Encounter: 03/01/2023  [facility] Cardiologist: Donato Schultz, MD   Subjective   No acute overnight events. He denies any recurrent hallucinations but still seems confused. He is very tangential and talking about things that occurred several years ago. He does not think he has had any recurrent chest pain overnight. No shortness of breath. He remains in rate controlled atrial fibrillation but is unaware of this. No palpitations.   Inpatient Medications    Scheduled Meds:  allopurinol  300 mg Oral Daily   vitamin C  1,000 mg Oral Daily   aspirin EC  81 mg Oral Daily   atorvastatin  80 mg Oral Daily   docusate sodium  100 mg Oral BID   insulin aspart  0-15 Units Subcutaneous TID WC   isosorbide mononitrate  30 mg Oral Daily   melatonin  5 mg Oral QHS   metoprolol tartrate  25 mg Oral BID   polyethylene glycol  17 g Oral Daily   Continuous Infusions:  sodium chloride Stopped (02/24/23 0824)   ceFEPime (MAXIPIME) IV     heparin 1,200 Units/hr (03/01/23 0526)   metronidazole 500 mg (03/01/23 0436)   [START ON 03/02/2023] vancomycin     PRN Meds: sodium chloride, acetaminophen, nitroGLYCERIN, mouth rinse, oxyCODONE-acetaminophen, senna-docusate   Vital Signs    Vitals:   02/28/23 1141 02/28/23 1604 02/28/23 1948 03/01/23 0353  BP: (!) 113/57 118/65 128/73 95/74  Pulse: 77 80 87 84  Resp: 16 18 16 18   Temp: 98.4 F (36.9 C) 98.5 F (36.9 C) 98.4 F (36.9 C) 98.6 F (37 C)  TempSrc: Oral Oral Oral Oral  SpO2: 97% 95% 97% 97%  Weight:      Height:        Intake/Output Summary (Last 24 hours) at 03/01/2023 0724 Last data filed at 02/28/2023 1836 Gross per 24 hour  Intake 479.3 ml  Output --  Net 479.3 ml      02/25/2023    6:42 PM 02/23/2023    6:00 AM 02/22/2023    6:00 AM  Last 3 Weights  Weight (lbs) 151 lb 7.3 oz 151 lb 7.3 oz 144 lb 13.5 oz  Weight (kg) 68.7 kg 68.7 kg 65.7 kg      Telemetry     Atrial fibrillation with PVCs and ventricular couplets. Rates in the 70s to 80s. - Personally Reviewed  ECG    No new ECG tracing today. - Personally Reviewed  Physical Exam   GEN: No acute distress.   Neck: No JVD.  Cardiac: Irregularly irregular rhythm with normal rate. No murmurs, rubs, or gallops. Radial pulses 2+ and equal bilaterally. Respiratory: Clear to auscultation bilaterally. No wheezes, rhonchi, or rales. GI: Soft, non-distended, and non-tender.  MS: No lower extremity edema. No deformity. Skin: Warm and skin. Neuro:  Confusion. No focal deficits.  Psych: Confusion. Tangential thought process.  Labs    High Sensitivity Troponin:   Recent Labs  Lab 02/20/23 2120 02/20/23 2344 02/21/23 0624 02/21/23 1815 02/21/23 2110  TROPONINIHS 13 12 31* 4,527* 6,445*     Chemistry Recent Labs  Lab 02/23/23 0002 02/23/23 2335 02/25/23 0749 02/26/23 0141 02/27/23 0727 02/28/23 0659 02/28/23 1644 03/01/23 0248  NA 137 134* 134*   < > 135 138 137 136  K 4.7 3.5 3.5   < > 3.3* 3.3* 3.0* 2.8*  CL 103 99 97*   < > 98 101 100 102  CO2  20* 21* 18*   < > 18* 20* 22 20*  GLUCOSE 178* 115* 198*   < > 182* 155* 199* 143*  BUN 44* 50* 62*   < > 54* 48* 45* 45*  CREATININE 2.13* 1.99* 2.29*   < > 2.01* 2.12* 1.96* 1.92*  CALCIUM 8.6* 8.5* 8.4*   < > 8.4* 8.4* 8.5* 7.9*  MG 2.2 1.7 2.0  --   --   --   --   --   PROT 6.0* 5.8* 6.0*   < > 6.0* 5.8* 6.1*  --   ALBUMIN 2.9* 2.6* 2.6*   < > 2.3* 2.2* 2.2*  --   AST 95* 82* 73*   < > 67* 72* 73*  --   ALT 59* 56* 60*   < > 63* 58* 60*  --   ALKPHOS 68 75 95   < > 79 81 74  --   BILITOT 2.6* 2.6* 2.7*   < > 2.5* 2.5* 2.4*  --   GFRNONAA 31* 34* 29*   < > 34* 31* 35* 35*  ANIONGAP 14 14 19*   < > 19* 17* 15 14   < > = values in this interval not displayed.    Lipids  Recent Labs  Lab 02/23/23 0002  CHOL 87  TRIG 171*  HDL 16*  LDLCALC 37  CHOLHDL 5.4    Hematology Recent Labs  Lab 02/28/23 0659 02/28/23 1644  03/01/23 0248  WBC 19.9* 20.8* 19.6*  RBC 2.97* 2.86* 2.51*  HGB 8.5* 8.2* 7.4*  HCT 24.0* 22.9* 20.5*  MCV 80.8 80.1 81.7  MCH 28.6 28.7 29.5  MCHC 35.4 35.8 36.1*  RDW 17.0* 17.1* 17.2*  PLT 202 215 184   Thyroid  Recent Labs  Lab 02/23/23 0002  TSH 0.953    BNPNo results for input(s): "BNP", "PROBNP" in the last 168 hours.  DDimer No results for input(s): "DDIMER" in the last 168 hours.   Radiology    CT HEAD WO CONTRAST ( )  Result Date: 03/01/2023 CLINICAL DATA:  Mental status change, unknown cause EXAM: CT HEAD WITHOUT CONTRAST TECHNIQUE: Contiguous axial images were obtained from the base of the skull through the vertex without intravenous contrast. RADIATION DOSE REDUCTION: This exam was performed according to the departmental dose-optimization program which includes automated exposure control, adjustment of the mA and/or kV according to patient size and/or use of iterative reconstruction technique. COMPARISON:  None Available. FINDINGS: Brain: No evidence of acute infarction, hemorrhage, mass, mass effect, or midline shift. No hydrocephalus or extra-axial fluid collection. Vascular: No hyperdense vessel. Atherosclerotic calcifications in the intracranial carotid and vertebral arteries. Skull: Negative for fracture or focal lesion. Sinuses/Orbits: Left maxillary mucous retention cyst. Status post bilateral lens replacements. Other: The mastoid air cells are well aerated. IMPRESSION: No acute intracranial process. Electronically Signed   By: Wiliam Ke M.D.   On: 03/01/2023 03:32   DG Chest 2 View  Result Date: 02/28/2023 CLINICAL DATA:  Pneumonia. EXAM: CHEST - 2 VIEW COMPARISON:  February 25, 2023. FINDINGS: Stable cardiomegaly. Status post coronary artery bypass graft and aortic valve repair. Lungs are clear. Old left rib fractures are noted. IMPRESSION: No active cardiopulmonary disease. Electronically Signed   By: Lupita Raider M.D.   On: 02/28/2023 13:45    Cardiac  Studies   Complete Echocardiogram 02/22/2023: Impressions: 1. Only limited images obtained; pt refused to continue study after  initial apical images obtained; doppler of aortic valve not complete.   2.  Left ventricular ejection fraction, by estimation, is 20 to 25%. The  left ventricle has severely decreased function. The left ventricle  demonstrates global hypokinesis. The left ventricular internal cavity size  was mildly dilated. There is mild left  ventricular hypertrophy. Left ventricular diastolic function could not be  evaluated.   3. Right ventricular systolic function is moderately reduced. The right  ventricular size is normal.   4. Left atrial size was moderately dilated.   5. The mitral valve is degenerative. Mild mitral valve regurgitation. No  evidence of mitral stenosis. Moderate mitral annular calcification.   6. The aortic valve has been repaired/replaced. Aortic valve  regurgitation is not visualized. There is a 23 mm Edwards bioprosthetic  valve present in the aortic position. Procedure Date: 2010.   7. Aortic dilatation noted. There is mild dilatation of the ascending  aorta, measuring 40 mm.  _______________  Limited Echocardiogram 02/24/2023: Impressions:  1. Diffuse hypokinesis worse in the inferior, inferoseptal and apical  walls; distal inferior akinesis. Cannot completely exlude mural thrombus  at apex, recomm limited echo wit Definity to confirm . Left ventricular  ejection fraction, by estimation, is  25 to 30%. The left ventricle has severely decreased function. Left  ventricular diastolic parameters are indeterminate.   2. Right ventricular systolic function is normal. The right ventricular  size is normal.   3. Left atrial size was mildly dilated.   4. Mild mitral valve regurgitation. Moderate to severe mitral annular  calcification.   5. S/p AVR (23 mm Edwards Bioprosthesis; procedure date 2010) Peak and  mean gradients through the valve are 14 and  7 mm respectively. . The  aortic valve has been repaired/replaced. Aortic valve regurgitation is not  visualized.   6. The inferior vena cava is dilated in size with <50% respiratory  variability, suggesting right atrial pressure of 15 mmHg.  _______________  Patient Profile     77 y.o. male with a history of CAD s/p CABG x4 in 10/2008, severe aortic stenosis  s/p AVR in 10/2008 at time of CABG, hypertension, hyperlipidemia, type 2 diabetes mellitus, CKD stage III, GERD, and obstructive sleep apnea who was admitted on 02/20/2023 for NSTEMI after presenting with chest pain. Hospitalization has been complicated by new onset atrial fibrillation, AKI superimposed on CKD, and altered mental status/ encephalopathy which has delayed cardiac catheterization.  Assessment & Plan    NSTEMI CAD s/p CABG in 2010 Patient has a history of CAD with prior CABG in 2010. He now presents with chest pain and ruled in for NSTEMI. High-sensitivity  troponin peaked at 6,445. Complete Echo on 8/14 showed LVEF of 20-25% with global hypokinesis but repeat limited Echo on 8/16 showed diffuse hypokinesis but worse in the inferior, inferospetal, and apical walls as well as distal inferior akinesis. Plan was for [facility] cardiac catheterization but this has been delayed due to worsening renal function, altered mental status, and chest x-ray concerning for pneumonia.  - No chest pain. - Continue IV Heparin. - Continue Imdur 30mg  daily and Lopressor 25mg  twice daily. - Continue aspirin and high-intensity statin. - He is tentatively on the board for R/ LHC today but he still seems some confused and renal function still above baseline so think this likely needs to be delayed. Will discuss with MD.  Acute HFrEF Echo this admission on 8/14 showed LVEF of 20-25% with global hypokinesis. Repeat limited Echo 8/16 showed LVEF of 25-30% with diffuse hypokinesis but worse in the inferior, inferospetal, and apical walls as well  as distal  inferior akinesis. He was diuresed with IV Lasix but then developed worsening renal function (last dose was 8/16) and was actual given gentle IVF on 8/10. Net negative 1.69 L this admission (however there as been no documented output since 8/18). Renal function stable. - Euvolemic on exam. - Continue Lopressor 25mg  twice daily. Will transition to Toprol-XL prior to discharge. - No ACEi, ARB, ARNI or MRA for now given renal function. Will hold off on SLGT2 inhibitor for now as well but can consider starting prior to discharge. - Plan was for [facility] this admission but has been delayed due to acute illness and AKI. Will discuss timing with MD.  New Onset Atrial Fibrillation Rate controlled. - Continue Lopressor 25mg  twice daily.  - Previously on IV Amiodarone but this has been stopped. - Continue IV Heparin for now. Will ultimately need Eliquis after cardiac catheterization.  Severe Aortic Stenosis s/p AVR in 2010 Echo this admission showed stable aortic valve with mean gradient of and peak gradient of 14 mmHg.  Hypertension BP soft at times but overall stable. - Continue Lopressor as above.  Hyperlipidemia Lipid panel this admission: Total Cholesterol 87, Triglycerides 171, HDL 16, LDL 37.  LDL goal <55.  - Home Lipitor increased from 20mg  to 80mg  daily. - Will need repeat lipid panel and LFTs in 6-8 weeks.  Type 2 Diabetes Mellitus Hemoglobin A1c 6.1% this admission. - Continue sliding scale insulin.  AKI on CKD Stage III Creatinine 1.28 on admission and peaked at 2.29 on 8/17. Baseline looks to be around 1.2 to 1.4.  - Stable at 1.92 today. - Continue to avoid Nephrotoxic agents.  - Continue to monitor closely.  Hypokalemia Potassium 2.8 today.  - Will repete.  Altered Mental Status/ Encephalopathy Head CT showed no acute intracranial findings. Internal Medicine team consulted - appreciated their assistance. Unclear etiology at this time - possibly infectious, metabolic,  or medication. Repeat chest x-ray showed on 8/20 showed no acute findings. Blood cultures negative to date. Urine culture negative. Lactic acid 2.1. - Currently on Vancomycin. Zosyn was discontinued. - Management per Internal Medicine.   For questions or updates, please contact [facility] Please consult [email or web address] for contact info under        Signed, Corrin Parker, PA-C  03/01/2023, 7:24 AM     ATTENDING ATTESTATION:  After conducting a review of all available clinical information with the care team, interviewing the patient, and performing a physical exam, I agree with the findings and plan described in this note.   GEN: No acute distress.   HEENT:  MMM, no JVD, no scleral icterus Cardiac: iRRR, no murmurs, rubs, or gallops.  Respiratory: Clear to auscultation bilaterally. GI: Soft, nontender, non-distended  MS: No edema; No deformity. Neuro:  Nonfocal  Vasc:  +2 radial pulses; warm and well perfused   Procal > 2; all culture data negative. CXR yesterday relatively clear.  This does not appear to represent cardiogenic shock (though patient's EF is ~30%).  He is warm and well perfused.  He seems better after starting broad spectrum abx which would support a provisional diagnosis of sepsis.  Will defer cor angiography for now until medical issues are better stabilized.  Lactate 2.1 yesterday.  Recheck lactate x 2 now. Appreciate hospital medicine input.  Alverda Skeans, MD Pager [phone]

## 2023-03-01 NOTE — Progress Notes (Signed)
Pt stated he had a slight nose bleed this AM but it stopped. No current sign of bleeding at this time. Pharmacy made aware and stated to continue to monitor pt and leave heparin gtt on for now. Will continue to monitor pt.

## 2023-03-01 NOTE — Progress Notes (Signed)
  Progress Note   Patient: Nathan [name] YNW:[id number] DOB: February 07, 1946 DOA: 02/20/2023     8 DOS: the patient was seen and examined on 03/01/2023 at 12:37PM      Brief hospital course: Nathan [name] is a 77 y.o. M with hx nec fasc years ago, hx OSA and nightmares, hx CAD s/p CABG and AVR, HTN, HLD who presented for chest pressure, admitted to Cardiology for NSTEMI.  Subsequent hospitalization complicated by respiratory failure requiring BiPAP and then later delirium requiring Precedex.  Hospitalist service consulted to evaluate for pneumonia and delirium.     Assessment and Plan:  NSTEMI Acute on chronic systolic CHF Seems stable. - Heparin gtt, aspirin, statin, Imdur, diuretics, metoprolol/spironolactone/Entresto/etc. Per Cardiology   Delirium This peaked after he was transferred to ICU, shortly after the episode when he had respiratory distress, rales on exam and required a foley and BiPAP.  He required Precedex for a time.  It has waxed and waned since then, seems somewhat mild today, but today is my first day seeing him.   Nothing focal on neuro exam.  Nothing to suggest status, nor HE, hypothyroidism, B12 deficiency.  I suspect this is garden variety hospital delirium in a 77 y.o  - Standard delirium precautions: blinds open and lights on during day, TV off, minimize interruptions at night, glasses/hearing aids, PT/OT, avoiding Beers list medications    Pneumonia With the WBC, new cough, CXR findings on 8/17, I agree this is probably a pneumonia.  5 days cephalosporin should be fine.  No harm to ID consult and pan-scan.  Blood cultures from 8/17 negative. - Follow CT - Continue antibiotics for now  Paroxysmal atrial fibrillation New onset. - Blood thinner, rate control per Cardiology  Acute kidney injury on CKD stage 3a, GFR 45-59 ml/min (HCC) Baseline 1.3-1.5.  Here, 1.9 on admission, up to 2.3.  Unchanged today at 1.9   Elevated LFTs Mild, resolving, due to  CHF  Anemia Stable  Hyperlipidemia - Statin per Cardiology  History of aortic valve repair Blood cultures negative       Subjective: Patient still mildly confused, and tired of being in the hospital, but no new chest pain, no dyspnea, no focal complaints.  No nursing concerns.     Physical Exam: BP 97/62 (BP Location: Left Arm)   Pulse 83   Temp 97.7 F (36.5 C) (Oral)   Resp 18   Ht 5\' 7"  (1.702 m)   Wt 68.7 kg   SpO2 98%   BMI 23.72 kg/m   Elderly adult male, lying in bed, no acute RRR, no murmurs, no peripheral edema Respiratory normal, lungs clear without rales or wheezes  Data Reviewed: With blood cell count 19, no change Respiratory virus panel negative COVID-negative CT head unremarkable Potassium down to 2.8 Hemoglobin down to 7.4 Creatinine stable at 1.9 Lactic acid 2.1, resolved today  Family Communication: None present    Disposition: Status is: Inpatient         Author: Alberteen Sam, MD 03/01/2023 5:38 PM  For on call review [email or web address].

## 2023-03-01 NOTE — TOC Benefit Eligibility Note (Signed)
Pharmacy Patient Advocate Encounter  Insurance verification completed.    The patient is insured through [employer]. Patient has Medicare and is not eligible for a copay card, but may be able to apply for patient assistance, if available.    Ran test claim for Eliquis 5MG   and the current 30 day co-pay is 240.49 due to 545.00 deductible.  Ran test claim for Xarelto 20MG   and the current 30 day co-pay is 236.58 due to 545.00 deductible.  This test claim was processed through [facility]- copay amounts may vary at other pharmacies due to pharmacy/plan contracts, or as the patient moves through the different stages of their insurance plan.

## 2023-03-01 NOTE — Progress Notes (Addendum)
ANTICOAGULATION CONSULT NOTE- Follow Up  Pharmacy Consult for Heparin  Indication: chest pain/ACS  Allergies  Allergen Reactions   Shellfish-Derived Products Anaphylaxis   Zolpidem Tartrate Other (See Comments)    Hallucinations    Patient Measurements: Height: 5\' 7"  (170.2 cm) Weight: 68.7 kg (151 lb 7.3 oz) IBW/kg (Calculated) : 66.1 Heparin DW = 68 kg  Vital Signs: Temp: 98.6 F (37 C) (08/21 0353) Temp Source: Oral (08/21 0353) BP: 95/74 (08/21 0353) Pulse Rate: 84 (08/21 0353)  Labs: Recent Labs    02/27/23 1557 02/28/23 0659 02/28/23 1644 03/01/23 0248  HGB  --  8.5* 8.2* 7.4*  HCT  --  24.0* 22.9* 20.5*  PLT  --  202 215 184  HEPARINUNFRC 0.59 0.52  --  0.96*  CREATININE  --  2.12* 1.96* 1.92*    Estimated Creatinine Clearance: 30.1 mL/min (A) (by C-G formula based on SCr of 1.92 mg/dL (H)).   Assessment: 77 y/o M with chest pain at rest beginning 8/11, unrelieved by SL NTG, some help with morphine.  Unable to get LHC on 8/14 due to agitation, now awaiting improvement in renal function.  No anticoagulation PTA, note potential LV thrombus but unable to be confirm due to inability to receive contrast.  8/16 ECHO noted inability to exclude thrombus.   Heparin level 0.52 therapeutic on 1350 units/hr.  LHC deferred with ongoing renal dysfunction.  No s/sx of bleeding (no further reports of nosebleeds) or infusion issues.   8/21 AM: heparin level 0.96 on 1350 units/hr (supratherapeutic). Per RN, no issues with the heaprin infusion or signs/symptoms of bleeding. Hgb decreased from 8.2>7.4 and plts down to 215>184-will continue to monitor.  Goal of Therapy:  Heparin level 0.3-0.7 units/ml Monitor platelets by anticoagulation protocol: Yes   Plan:  Decrease heparin infusion to 1200 units/hr Check 8h heparin level Check heparin level daily while on heparin Continue to monitor H&H and platelets  Thank you for allowing pharmacy to be a part of this patient's  care.  Arabella Merles, PharmD. Clinical Pharmacist 03/01/2023 5:17 AM

## 2023-03-02 ENCOUNTER — Inpatient Hospital Stay (HOSPITAL_COMMUNITY): Payer: Medicare Other

## 2023-03-02 ENCOUNTER — Telehealth: Payer: Self-pay | Admitting: Cardiology

## 2023-03-02 DIAGNOSIS — N179 Acute kidney failure, unspecified: Secondary | ICD-10-CM | POA: Diagnosis not present

## 2023-03-02 DIAGNOSIS — E876 Hypokalemia: Secondary | ICD-10-CM | POA: Insufficient documentation

## 2023-03-02 DIAGNOSIS — N1831 Chronic kidney disease, stage 3a: Secondary | ICD-10-CM | POA: Diagnosis not present

## 2023-03-02 DIAGNOSIS — K810 Acute cholecystitis: Secondary | ICD-10-CM | POA: Insufficient documentation

## 2023-03-02 DIAGNOSIS — T148XXA Other injury of unspecified body region, initial encounter: Secondary | ICD-10-CM

## 2023-03-02 DIAGNOSIS — I480 Paroxysmal atrial fibrillation: Secondary | ICD-10-CM

## 2023-03-02 DIAGNOSIS — D62 Acute posthemorrhagic anemia: Secondary | ICD-10-CM | POA: Diagnosis present

## 2023-03-02 DIAGNOSIS — I249 Acute ischemic heart disease, unspecified: Secondary | ICD-10-CM | POA: Diagnosis not present

## 2023-03-02 DIAGNOSIS — R079 Chest pain, unspecified: Secondary | ICD-10-CM | POA: Diagnosis not present

## 2023-03-02 DIAGNOSIS — I5043 Acute on chronic combined systolic (congestive) and diastolic (congestive) heart failure: Secondary | ICD-10-CM | POA: Diagnosis not present

## 2023-03-02 HISTORY — PX: IR PERC CHOLECYSTOSTOMY: IMG2326

## 2023-03-02 LAB — HEPATIC FUNCTION PANEL
ALT: 52 U/L — ABNORMAL HIGH (ref 0–44)
AST: 71 U/L — ABNORMAL HIGH (ref 15–41)
Albumin: 2.2 g/dL — ABNORMAL LOW (ref 3.5–5.0)
Alkaline Phosphatase: 64 U/L (ref 38–126)
Bilirubin, Direct: 0.6 mg/dL — ABNORMAL HIGH (ref 0.0–0.2)
Indirect Bilirubin: 1.2 mg/dL — ABNORMAL HIGH (ref 0.3–0.9)
Total Bilirubin: 1.8 mg/dL — ABNORMAL HIGH (ref 0.3–1.2)
Total Protein: 5.8 g/dL — ABNORMAL LOW (ref 6.5–8.1)

## 2023-03-02 LAB — CBC
HCT: 17.9 % — ABNORMAL LOW (ref 39.0–52.0)
Hemoglobin: 6.2 g/dL — CL (ref 13.0–17.0)
MCH: 28.8 pg (ref 26.0–34.0)
MCHC: 34.6 g/dL (ref 30.0–36.0)
MCV: 83.3 fL (ref 80.0–100.0)
Platelets: 226 10*3/uL (ref 150–400)
RBC: 2.15 MIL/uL — ABNORMAL LOW (ref 4.22–5.81)
RDW: 17.4 % — ABNORMAL HIGH (ref 11.5–15.5)
WBC: 20.5 10*3/uL — ABNORMAL HIGH (ref 4.0–10.5)
nRBC: 0.3 % — ABNORMAL HIGH (ref 0.0–0.2)

## 2023-03-02 LAB — BASIC METABOLIC PANEL
Anion gap: 14 (ref 5–15)
BUN: 54 mg/dL — ABNORMAL HIGH (ref 8–23)
CO2: 19 mmol/L — ABNORMAL LOW (ref 22–32)
Calcium: 8.3 mg/dL — ABNORMAL LOW (ref 8.9–10.3)
Chloride: 106 mmol/L (ref 98–111)
Creatinine, Ser: 2.01 mg/dL — ABNORMAL HIGH (ref 0.61–1.24)
GFR, Estimated: 34 mL/min — ABNORMAL LOW (ref >60)
Glucose, Bld: 179 mg/dL — ABNORMAL HIGH (ref 70–99)
Potassium: 3.7 mmol/L (ref 3.5–5.1)
Sodium: 139 mmol/L (ref 135–145)

## 2023-03-02 LAB — CULTURE, BLOOD (ROUTINE X 2)
Culture: NO GROWTH
Culture: NO GROWTH
Special Requests: ADEQUATE
Special Requests: ADEQUATE

## 2023-03-02 LAB — PREPARE RBC (CROSSMATCH)

## 2023-03-02 LAB — GLUCOSE, CAPILLARY
Glucose-Capillary: 156 mg/dL — ABNORMAL HIGH (ref 70–99)
Glucose-Capillary: 157 mg/dL — ABNORMAL HIGH (ref 70–99)
Glucose-Capillary: 162 mg/dL — ABNORMAL HIGH (ref 70–99)
Glucose-Capillary: 171 mg/dL — ABNORMAL HIGH (ref 70–99)
Glucose-Capillary: 184 mg/dL — ABNORMAL HIGH (ref 70–99)
Glucose-Capillary: 200 mg/dL — ABNORMAL HIGH (ref 70–99)
Glucose-Capillary: 205 mg/dL — ABNORMAL HIGH (ref 70–99)

## 2023-03-02 LAB — HEPARIN LEVEL (UNFRACTIONATED): Heparin Unfractionated: 0.59 [IU]/mL (ref 0.30–0.70)

## 2023-03-02 LAB — MAGNESIUM: Magnesium: 2.1 mg/dL (ref 1.7–2.4)

## 2023-03-02 MED ORDER — MIDODRINE HCL 5 MG PO TABS
2.5000 mg | ORAL_TABLET | Freq: Once | ORAL | Status: AC
  Administered 2023-03-02: 2.5 mg via ORAL
  Filled 2023-03-02: qty 1

## 2023-03-02 MED ORDER — SODIUM CHLORIDE 0.9 % IV SOLN
INTRAVENOUS | Status: DC | PRN
  Administered 2023-03-02: 2 g via INTRAVENOUS

## 2023-03-02 MED ORDER — SODIUM CHLORIDE 0.9% IV SOLUTION: Freq: Once | INTRAVENOUS | Status: AC

## 2023-03-02 MED ORDER — SODIUM CHLORIDE 0.9% FLUSH
5.0000 mL | Freq: Three times a day (TID) | INTRAVENOUS | Status: DC
  Administered 2023-03-02 – 2023-03-13 (×27): 5 mL

## 2023-03-02 MED ORDER — LIDOCAINE-EPINEPHRINE 1 %-1:100000 IJ SOLN
20.0000 mL | Freq: Once | INTRAMUSCULAR | Status: AC
  Administered 2023-03-02: 10 mL via INTRADERMAL

## 2023-03-02 MED ORDER — KETOROLAC TROMETHAMINE 30 MG/ML IJ SOLN
INTRAMUSCULAR | Status: AC
  Filled 2023-03-02: qty 1

## 2023-03-02 MED ORDER — SODIUM CHLORIDE 0.9 % IV SOLN
INTRAVENOUS | Status: AC
  Filled 2023-03-02: qty 2

## 2023-03-02 MED ORDER — KETOROLAC TROMETHAMINE 15 MG/ML IJ SOLN
INTRAMUSCULAR | Status: DC | PRN
  Administered 2023-03-02: 15 mg via INTRAVENOUS

## 2023-03-02 MED ORDER — MORPHINE SULFATE (PF) 4 MG/ML IV SOLN
2.8000 mg | Freq: Once | INTRAVENOUS | Status: AC
  Administered 2023-03-02: 2.8 mg via INTRAVENOUS
  Filled 2023-03-02: qty 1

## 2023-03-02 MED ORDER — IOHEXOL 300 MG/ML  SOLN
50.0000 mL | Freq: Once | INTRAMUSCULAR | Status: AC | PRN
  Administered 2023-03-02: 15 mL

## 2023-03-02 MED ORDER — LIDOCAINE-EPINEPHRINE 1 %-1:100000 IJ SOLN
INTRAMUSCULAR | Status: AC
  Filled 2023-03-02: qty 1

## 2023-03-02 MED ORDER — TECHNETIUM TC 99M MEBROFENIN IV KIT
5.3000 | PACK | Freq: Once | INTRAVENOUS | Status: AC | PRN
  Administered 2023-03-02: 5.3 via INTRAVENOUS

## 2023-03-02 MED ORDER — FUROSEMIDE 10 MG/ML IJ SOLN
20.0000 mg | Freq: Once | INTRAMUSCULAR | Status: AC
  Administered 2023-03-02: 20 mg via INTRAVENOUS
  Filled 2023-03-02: qty 2

## 2023-03-02 MED ORDER — MIDAZOLAM HCL 2 MG/2ML IJ SOLN
INTRAMUSCULAR | Status: AC
  Filled 2023-03-02: qty 2

## 2023-03-02 MED ORDER — OFF THE BEAT BOOK
Freq: Once | Status: AC
  Filled 2023-03-02: qty 1

## 2023-03-02 MED ORDER — SODIUM CHLORIDE 0.9 % IV SOLN
3.0000 g | Freq: Three times a day (TID) | INTRAVENOUS | Status: DC
  Administered 2023-03-02 – 2023-03-03 (×3): 3 g via INTRAVENOUS
  Filled 2023-03-02 (×3): qty 8

## 2023-03-02 MED ORDER — MIDAZOLAM HCL 2 MG/2ML IJ SOLN
INTRAMUSCULAR | Status: DC | PRN
  Administered 2023-03-02: 1 mg via INTRAVENOUS

## 2023-03-02 NOTE — Hospital Course (Addendum)
Mr. Geyman was admitted to the hospital with the working diagnosis of NSTEMI.   77 y.o. M with DM, HTN, CAD s/p CABG and AVR, hx OSA, and history severe necrotizing fasciitis some years ago who presented with chest discomfort.  Constant left chest pressure, that prompted him to come to the hospital. On his initial physical examination his blood pressure was 121/60, HR 65, RR 18 and 02 saturation 100%, lungs with no wheezing or rales, heart with S1 and S2 present and regular with no gallops, abdomen with no distention and no lower extremity edema.   High sensitive troponin 12, 31, 4,527, 6,445    EKG 90 bpm, left axis deviation, normal intervals, sinus rhythm with old Q waves V1 to V3, with no ST segment changes, new negative T wave lead I and AvL.   He was placed on nitroglycerin drip for pain control and heparin drip for anticoagulation.  8/14 echo: EF 20-25% 8/20 echo: EF 25-30% with RWMA, no thrombus; AV repair noted  8/13: Admitted on heparin to Cardiology for NSTEMI --> evening of HD1, developed respiratory distress and rales requiring BiPAP and ICU transfer 8/14: Developed delirium requiring Precedex; Transaminitis noted 8/15: Cr peaks at 2.15 8/16: Weaned off Precedex; new onset Atrial fibrillation noted 8/17: Work up for leukocytosis started 8/18: Antibiotics started for Pneumonia based on CXR 8/20: Hgb down to 8s 8/21: ID consulted; CT C/A/P shows abnormal gallbladder, new hematoma; heparin stopped 8/22: IR and Gen Surg consulted; HIDA confirms cholecystitis; transfused 2 units; Perc chole drain placed 8/23: Worsening agitation/delirium  08/24 further droop in Hgb down to 6,9, hypotension. Stopped heparin drip and ordered PRBC transfusion x1.  08/25 mentation has improved but not back to baseline, continue to use intermittently soft wrist restrains.  08/26 encephalopathy slowly improving, today off restrains and improved po intake.  08/27 patient pulled his biliary drain and will  need to be replaced per IR. Antibiotic therapy changed to oral Augmentin. Encephalopathy is improving, continue off restrains.

## 2023-03-02 NOTE — Assessment & Plan Note (Addendum)
Cr up again today to 2.5 in setting of anemia, hypotension post-procedure yesterday. - Daily BMP - IV fluids cautiously  - Avoid nephrotoxins - Hold Imdur, avoid hypotension

## 2023-03-02 NOTE — Assessment & Plan Note (Deleted)
On hospital day 1, in setting of CHF.  Resolved

## 2023-03-02 NOTE — Assessment & Plan Note (Addendum)
New onset.  CHA2DS2-Vasc 6 (age, HTN, CHF, DM, vascular disease).  Continue rate control with metoprolol, holding anticoagulation due to acute bleed (left thigh hematoma)

## 2023-03-02 NOTE — Progress Notes (Addendum)
Rounding Note    Patient Name: Nathan [name] Date of Encounter: 03/02/2023  [facility] Cardiologist: Donato Schultz, MD   Subjective   He was noted to have left groin hematoma overnight and heparin was stopped. CT yesterday also showed acute cholecystitis and RUQ ultrasound confirmed this. He notes a little left upper quadrant pain but no chest pain. No shortness of breath. He remains in atrial fibrillation with rates in the 100s to 110s. Of note, his confusion tends to worsen at night.  Inpatient Medications    Scheduled Meds:  allopurinol  300 mg Oral Daily   vitamin C  1,000 mg Oral Daily   aspirin EC  81 mg Oral Daily   atorvastatin  80 mg Oral Daily   docusate sodium  100 mg Oral BID   insulin aspart  0-9 Units Subcutaneous Q4H   isosorbide mononitrate  30 mg Oral Daily   melatonin  5 mg Oral QHS   metoprolol tartrate  25 mg Oral BID   polyethylene glycol  17 g Oral Daily   Continuous Infusions:  sodium chloride Stopped (02/24/23 0824)   ceFEPime (MAXIPIME) IV 200 mL/hr at 03/01/23 1852   metronidazole 500 mg (03/02/23 0416)   vancomycin     PRN Meds: sodium chloride, acetaminophen, nitroGLYCERIN, mouth rinse, oxyCODONE-acetaminophen, senna-docusate   Vital Signs    Vitals:   03/01/23 1925 03/02/23 0409 03/02/23 0841 03/02/23 0907  BP: 110/74   105/64  Pulse: 82  (!) 115 (!) 123  Resp: 16   20  Temp: (!) 97.5 F (36.4 C) 97.7 F (36.5 C)  97.8 F (36.6 C)  TempSrc: Oral Oral  Axillary  SpO2: 97%   95%  Weight:      Height:        Intake/Output Summary (Last 24 hours) at 03/02/2023 0927 Last data filed at 03/01/2023 1927 Gross per 24 hour  Intake 547.01 ml  Output 250 ml  Net 297.01 ml      02/25/2023    6:42 PM 02/23/2023    6:00 AM 02/22/2023    6:00 AM  Last 3 Weights  Weight (lbs) 151 lb 7.3 oz 151 lb 7.3 oz 144 lb 13.5 oz  Weight (kg) 68.7 kg 68.7 kg 65.7 kg      Telemetry    Atrial fibrillation with rates in the low 100s to  110s. PVCs and ventricular couplets noted.  - Personally Reviewed  ECG    No new ECG tracing today. - Personally Reviewed  Physical Exam   Physical Exam per MD:  General: 77 y.o. Caucasian male resting comfortably in no acute distress. HEENT: Normocephalic and atraumatic. Sclera clear.  Neck: Supple.  Heart: Tachycardic with irregularly irregular rhythm. No murmurs, gallops, or rubs.  Lungs: No increased work of breathing. Clear to ausculation bilaterally. No wheezes, rhonchi, or rales.  Abdomen: Soft, non-distended, and non-tender to palpation.  Extremities: Soft left groin hematoma. No lower extremity edema.   Skin: Warm and dry. Neuro: Alert and oriented x3. No focal deficits. Psych: Normal affect.    Labs    High Sensitivity Troponin:   Recent Labs  Lab 02/20/23 2120 02/20/23 2344 02/21/23 0624 02/21/23 1815 02/21/23 2110  TROPONINIHS 13 12 31* 4,527* 6,445*     Chemistry Recent Labs  Lab 02/23/23 2335 02/25/23 0749 02/26/23 0141 02/27/23 0727 02/28/23 0659 02/28/23 1644 03/01/23 0248  NA 134* 134*   < > 135 138 137 136  K 3.5 3.5   < > 3.3*  3.3* 3.0* 2.8*  CL 99 97*   < > 98 101 100 102  CO2 21* 18*   < > 18* 20* 22 20*  GLUCOSE 115* 198*   < > 182* 155* 199* 143*  BUN 50* 62*   < > 54* 48* 45* 45*  CREATININE 1.99* 2.29*   < > 2.01* 2.12* 1.96* 1.92*  CALCIUM 8.5* 8.4*   < > 8.4* 8.4* 8.5* 7.9*  MG 1.7 2.0  --   --   --   --   --   PROT 5.8* 6.0*   < > 6.0* 5.8* 6.1*  --   ALBUMIN 2.6* 2.6*   < > 2.3* 2.2* 2.2*  --   AST 82* 73*   < > 67* 72* 73*  --   ALT 56* 60*   < > 63* 58* 60*  --   ALKPHOS 75 95   < > 79 81 74  --   BILITOT 2.6* 2.7*   < > 2.5* 2.5* 2.4*  --   GFRNONAA 34* 29*   < > 34* 31* 35* 35*  ANIONGAP 14 19*   < > 19* 17* 15 14   < > = values in this interval not displayed.    Lipids No results for input(s): "CHOL", "TRIG", "HDL", "LABVLDL", "LDLCALC", "CHOLHDL" in the last 168 hours.  Hematology Recent Labs  Lab 02/28/23 0659  02/28/23 1644 03/01/23 0248  WBC 19.9* 20.8* 19.6*  RBC 2.97* 2.86* 2.51*  HGB 8.5* 8.2* 7.4*  HCT 24.0* 22.9* 20.5*  MCV 80.8 80.1 81.7  MCH 28.6 28.7 29.5  MCHC 35.4 35.8 36.1*  RDW 17.0* 17.1* 17.2*  PLT 202 215 184   Thyroid No results for input(s): "TSH", "FREET4" in the last 168 hours.  BNPNo results for input(s): "BNP", "PROBNP" in the last 168 hours.  DDimer No results for input(s): "DDIMER" in the last 168 hours.   Radiology    US Abdomen Limited RUQ (LIVER/GB)  Result Date: 03/02/2023 CLINICAL DATA:  Findings on CT concerning for acute cholecystitis EXAM: ULTRASOUND ABDOMEN LIMITED RIGHT UPPER QUADRANT COMPARISON:  CT abdomen and pelvis 03/01/2023 FINDINGS: Gallbladder: Mild wall thickening and pericholecystic fluid. Sludge and stones in the gallbladder including in the gallbladder neck measuring up to 1.3 cm. Common bile duct: Diameter: 5 mm.  No intrahepatic biliary dilation. Liver: No focal lesion identified. Within normal limits in parenchymal echogenicity. Portal vein is patent on color Doppler imaging with normal direction of blood flow towards the liver. Other: None. IMPRESSION: Findings compatible with acute cholecystitis. Electronically Signed   By: Minerva Fester M.D.   On: 03/02/2023 02:46   CT CHEST ABDOMEN PELVIS WO CONTRAST  Result Date: 03/01/2023 CLINICAL DATA:  Chest pain, infection. EXAM: CT CHEST, ABDOMEN AND PELVIS WITHOUT CONTRAST TECHNIQUE: Multidetector CT imaging of the chest, abdomen and pelvis was performed following the standard protocol without IV contrast. RADIATION DOSE REDUCTION: This exam was performed according to the departmental dose-optimization program which includes automated exposure control, adjustment of the mA and/or kV according to patient size and/or use of iterative reconstruction technique. COMPARISON:  CT chest abdomen and pelvis 01/21/2023 FINDINGS: CT CHEST FINDINGS Cardiovascular: Heart is mildly enlarged. Aorta is normal in size.  Patient is status post cardiac surgery. There are atherosclerotic calcifications of the aorta and coronary arteries. There is no pericardial effusion. Mediastinum/Nodes: No enlarged mediastinal, hilar, or axillary lymph nodes. Thyroid gland, trachea, and esophagus demonstrate no significant findings. Lungs/Pleura: There are diffuse peripheral scattered reticular  opacities throughout both lungs, slightly increased from prior. There is no lung consolidation, pleural effusion or pneumothorax. Musculoskeletal: There are chronic left third, fourth, fifth, sixth rib fractures. There is subacute/acute nondisplaced posterolateral left eighth rib fracture. Sternotomy wires are present. Degenerative changes affect the spine. CT ABDOMEN PELVIS FINDINGS Hepatobiliary: Gallbladder is dilated. There is diffuse gallbladder wall thickening and surrounding inflammatory stranding. Gallstones are present. There is no biliary ductal dilatation. Liver is within normal limits. Pancreas: Unremarkable. No pancreatic ductal dilatation or surrounding inflammatory changes. Spleen: There are calcified granulomas in the spleen. The spleen is otherwise within normal limits. Adrenals/Urinary Tract: There is a 4.4 cm left renal cyst. There is no hydronephrosis. Vascular calcifications are noted in both kidneys. The bladder and adrenal glands are within normal limits. Stomach/Bowel: Stomach is within normal limits. Appendix appears normal. No evidence of bowel wall thickening, distention, or inflammatory changes. There is sigmoid and descending colon diverticulosis. Vascular/Lymphatic: Aortic atherosclerosis. No enlarged abdominal or pelvic lymph nodes. Reproductive: Prostate is unremarkable. Other: There is a small fat containing right inguinal hernia. There is mild presacral edema. There is no ascites. Musculoskeletal: No acute fracture or dislocation. Degenerative changes affect the spine. There is incompletely imaged heterogeneous mixed hyper  and hypodensity in the region of the adductor musculature measuring 9.0 x 8.6 cm image 3/125. IMPRESSION: 1. Findings compatible with acute cholecystitis. 2. Subacute/acute nondisplaced left eighth rib fracture. 3. Diffuse peripheral reticular opacities throughout both lungs, slightly increased from prior, worrisome for chronic interstitial lung disease. 4. Incompletely imaged heterogeneous mixed hyper and hypodensity in the region of the right adductor musculature measuring 9.0 x 8.6 cm. Findings may represent hematoma, but other etiologies are not excluded. Correlate clinically for infection. 5. Colonic diverticulosis. Aortic Atherosclerosis (ICD10-I70.0). Electronically Signed   By: Darliss Cheney M.D.   On: 03/01/2023 19:25   ECHOCARDIOGRAM LIMITED  Result Date: 03/01/2023    ECHOCARDIOGRAM LIMITED REPORT   Patient Name:   ONYX KINNARD Date of Exam: 02/28/2023 Medical Rec #:  [id number]      Height:       67.0 in Accession #:    [id number]     Weight:       151.5 lb Date of Birth:  11-21-1945     BSA:          1.797 m Patient Age:    76 years       BP:           113/57 mmHg Patient Gender: M              HR:           77 bpm. Exam Location:  Inpatient Procedure: Limited Echo, Cardiac Doppler, Color Doppler and Intracardiac            Opacification Agent Indications:    I51.9 Decreased LV function. Rule out LV thrombus.  History:        Patient has prior history of Echocardiogram examinations, most                 recent 02/24/2023. CAD, Prior CABG, Aortic Valve Disease,                 Signs/Symptoms:Chest Pain; Risk Factors:Hypertension, Sleep                 Apnea, Dyslipidemia and Diabetes. AVR.                 Aortic Valve: 23 mm Edwards bioprosthetic valve is present  in                 the aortic position.  Sonographer:    Sheralyn Boatman RDCS Referring Phys: [phone] MICHAEL COOPER  Sonographer Comments: Technically difficult study due to poor echo windows. IMPRESSIONS  1. Left ventricular ejection fraction, by  estimation, is 25 to 30%. The left ventricle has severely decreased function. The left ventricle demonstrates regional wall motion abnormalities (see scoring diagram/findings for description). There is mild concentric left ventricular hypertrophy. There is global hypokinesis but more severe hypokinesis of the left ventricular, inferior wall, inferoseptal wall and apical segment.  2. The mitral valve is degenerative. Trivial mitral valve regurgitation. Moderate mitral annular calcification.  3. The aortic valve has been repaired/replaced. There is a 23 mm Edwards bioprosthetic valve present in the aortic position.  4. No evidence of apical LV thrombus by definity contrast. FINDINGS  Left Ventricle: Left ventricular ejection fraction, by estimation, is 25 to 30%. The left ventricle has severely decreased function. The left ventricle demonstrates regional wall motion abnormalities. Definity contrast agent was given IV to delineate the left ventricular endocardial borders. There is mild concentric left ventricular hypertrophy. Mitral Valve: The mitral valve is degenerative in appearance. There is moderate thickening of the mitral valve leaflet(s). There is moderate calcification of the mitral valve leaflet(s). Moderately decreased mobility of the mitral valve leaflets. Moderate mitral annular calcification. Trivial mitral valve regurgitation. Aortic Valve: The aortic valve has been repaired/replaced. There is a 23 mm Edwards bioprosthetic valve present in the aortic position. Additional Comments: Spectral Doppler performed. Color Doppler performed.  LEFT VENTRICLE PLAX 2D LVIDd:         5.10 cm LVIDs:         4.40 cm LV PW:         1.30 cm LV IVS:        1.20 cm  LV Volumes (MOD) LV vol d, MOD A2C: 143.0 ml LV vol d, MOD A4C: 168.5 ml LV vol s, MOD A2C: 103.0 ml LV vol s, MOD A4C: 116.5 ml LV SV MOD A2C:     40.0 ml LV SV MOD A4C:     168.5 ml LV SV MOD BP:      48.7 ml IVC IVC diam: 2.60 cm Armanda Magic MD  Electronically signed by Armanda Magic MD Signature Date/Time: 03/01/2023/8:15:09 AM    Final    CT HEAD WO CONTRAST ( )  Result Date: 03/01/2023 CLINICAL DATA:  Mental status change, unknown cause EXAM: CT HEAD WITHOUT CONTRAST TECHNIQUE: Contiguous axial images were obtained from the base of the skull through the vertex without intravenous contrast. RADIATION DOSE REDUCTION: This exam was performed according to the departmental dose-optimization program which includes automated exposure control, adjustment of the mA and/or kV according to patient size and/or use of iterative reconstruction technique. COMPARISON:  None Available. FINDINGS: Brain: No evidence of acute infarction, hemorrhage, mass, mass effect, or midline shift. No hydrocephalus or extra-axial fluid collection. Vascular: No hyperdense vessel. Atherosclerotic calcifications in the intracranial carotid and vertebral arteries. Skull: Negative for fracture or focal lesion. Sinuses/Orbits: Left maxillary mucous retention cyst. Status post bilateral lens replacements. Other: The mastoid air cells are well aerated. IMPRESSION: No acute intracranial process. Electronically Signed   By: Wiliam Ke M.D.   On: 03/01/2023 03:32   DG Chest 2 View  Result Date: 02/28/2023 CLINICAL DATA:  Pneumonia. EXAM: CHEST - 2 VIEW COMPARISON:  February 25, 2023. FINDINGS: Stable cardiomegaly. Status post coronary artery bypass graft  and aortic valve repair. Lungs are clear. Old left rib fractures are noted. IMPRESSION: No active cardiopulmonary disease. Electronically Signed   By: Lupita Raider M.D.   On: 02/28/2023 13:45    Cardiac Studies   Complete Echocardiogram 02/22/2023: Impressions: 1. Only limited images obtained; pt refused to continue study after  initial apical images obtained; doppler of aortic valve not complete.   2. Left ventricular ejection fraction, by estimation, is 20 to 25%. The  left ventricle has severely decreased function. The left  ventricle  demonstrates global hypokinesis. The left ventricular internal cavity size  was mildly dilated. There is mild left  ventricular hypertrophy. Left ventricular diastolic function could not be  evaluated.   3. Right ventricular systolic function is moderately reduced. The right  ventricular size is normal.   4. Left atrial size was moderately dilated.   5. The mitral valve is degenerative. Mild mitral valve regurgitation. No  evidence of mitral stenosis. Moderate mitral annular calcification.   6. The aortic valve has been repaired/replaced. Aortic valve  regurgitation is not visualized. There is a 23 mm Edwards bioprosthetic  valve present in the aortic position. Procedure Date: 2010.   7. Aortic dilatation noted. There is mild dilatation of the ascending  aorta, measuring 40 mm.  _______________   Limited Echocardiogram 02/24/2023: Impressions:  1. Diffuse hypokinesis worse in the inferior, inferoseptal and apical  walls; distal inferior akinesis. Cannot completely exlude mural thrombus  at apex, recomm limited echo wit Definity to confirm . Left ventricular  ejection fraction, by estimation, is  25 to 30%. The left ventricle has severely decreased function. Left  ventricular diastolic parameters are indeterminate.   2. Right ventricular systolic function is normal. The right ventricular  size is normal.   3. Left atrial size was mildly dilated.   4. Mild mitral valve regurgitation. Moderate to severe mitral annular  calcification.   5. S/p AVR (23 mm Edwards Bioprosthesis; procedure date 2010) Peak and  mean gradients through the valve are 14 and 7 mm respectively. . The  aortic valve has been repaired/replaced. Aortic valve regurgitation is not  visualized.   6. The inferior vena cava is dilated in size with <50% respiratory  variability, suggesting right atrial pressure of 15 mmHg.   Patient Profile     77 y.o. male with a history of CAD s/p CABG x4 in 10/2008,  severe aortic stenosis  s/p AVR in 10/2008 at time of CABG, hypertension, hyperlipidemia, type 2 diabetes mellitus, CKD stage III, GERD, and obstructive sleep apnea who was admitted on 02/20/2023 for NSTEMI after presenting with chest pain. Hospitalization has been complicated by new onset atrial fibrillation, AKI superimposed on CKD, pneumonia, acute cholecystitis, and encephalopathy/ delirium which has delayed cardiac catheterization.   Assessment & Plan    NSTEMI CAD s/p CABG in 2010 Patient has a history of CAD with prior CABG in 2010. He now presents with chest pain and ruled in for NSTEMI. High-sensitivity  troponin peaked at 6,445. Complete Echo on 8/14 showed LVEF of 20-25% with global hypokinesis but repeat limited Echo on 8/16 showed diffuse hypokinesis but worse in the inferior, inferospetal, and apical walls as well as distal inferior akinesis. Plan was for [facility] cardiac catheterization but this has been delayed due to worsening renal function, altered mental status, and chest x-ray concerning for pneumonia.  - No chest pain. - IV Heparin has been stopped due to spontaneous left groin hematoma. - Continue Imdur 30mg  daily and Lopressor 25mg   twice daily. - Continue aspirin and high-intensity statin. - Will defer [facility] for now and allow for treatment of acute cholecystitis.    Acute HFrEF Echo this admission on 8/14 showed LVEF of 20-25% with global hypokinesis. Repeat limited Echo 8/16 showed LVEF of 25-30% with diffuse hypokinesis but worse in the inferior, inferospetal, and apical walls as well as distal inferior akinesis. He was diuresed with IV Lasix but then developed worsening renal function (last dose was 8/16) and was actual given gentle IVF on 8/10. Net negative 1.69 L this admission (however there as been no documented output since 8/18). Renal function stable. - Euvolemic on exam. - Continue Lopressor 25mg  twice daily. Will transition to Toprol-XL prior to discharge. - No ACEi,  ARB, ARNI or MRA for now given renal function. Will hold off on SLGT2 inhibitor for now as well but can consider starting prior to discharge. - Plan was for [facility] this admission but has been delayed due to acute illness and AKI.   New Onset Atrial Fibrillation Rates currently in the low 100s to 110s which are reasonable in the setting of his acute infection. - Continue Lopressor 25mg  twice daily. Can uptitrate if needed. - Previously on IV Amiodarone but this has been stopped. - IV Heparin was stopped due to left groin hematoma. Will ultimately need to discuss starting Eliquis after all procedures are done.   Severe Aortic Stenosis s/p AVR in 2010 Echo this admission showed stable aortic valve with mean gradient of and peak gradient of 14 mmHg.   Hypertension BP soft at times but overall stable. - Continue Lopressor as above.   Hyperlipidemia Lipid panel this admission: Total Cholesterol 87, Triglycerides 171, HDL 16, LDL 37.  LDL goal <55.  - Home Lipitor increased from 20mg  to 80mg  daily. - Will need repeat lipid panel and LFTs in 6-8 weeks.   Type 2 Diabetes Mellitus Hemoglobin A1c 6.1% this admission. - Continue sliding scale insulin.   AKI on CKD Stage III Creatinine 1.28 on admission and peaked at 2.29 on 8/17. Baseline looks to be around 1.2 to 1.4.  - Stable at 1.92 yesterday. - Continue to avoid Nephrotoxic agents.  - Today's labs pending.   Hypokalemia Potassium 2.8 yesterday. Repleted. - Today's labs pending.  Altered Mental Status/ Encephalopathy Delirium Head CT showed no acute intracranial findings. Internal Medicine team consulted - appreciated their assistance. Unclear etiology at this time - possibly infectious, metabolic, or medication. Repeat chest x-ray showed on 8/20 showed no acute findings. Blood cultures negative to date. Urine culture negative. Procalcitonin elevated. Lactic acid 2.1. CT showed acute cholecystitis and RUQ ultrasounds has confirmed  this. - Management per Internal Medicine.  Acute Cholecystitis  CT showed acute cholecystitis and RUQ ultrasounds has confirmed this. - Continue antibiotics per Internal Medicine. - General Surgery and IR have been consulted for possible percutaneous drain placement.  Pneumonia Chest x-ray on 8/17 was concerning for pneumonia but repeat chest x-ray no 8/20 showed no acute findings. Proclination elevated and WBC elevated.  - Continue antibiotics per Internal Medicine.  Of note, Dr. Maryfrances Bunnell has graciously offered to take over as primary attending and we are very appreciative of this. Therefore, patient has been transferred to Internal Medicine service. We will continue to follow in consultation.    For questions or updates, please contact [facility] Please consult [email or web address] for contact info under        Signed, Corrin Parker, PA-C  03/02/2023, 9:27 AM  ATTENDING ATTESTATION:  After conducting a review of all available clinical information with the care team, interviewing the patient, and performing a physical exam, I agree with the findings and plan described in this note.   GEN: Mildly confused HEENT:  MMM, no JVD, no scleral icterus Cardiac: iRRR, no murmurs, rubs, or gallops.  Respiratory: Clear to auscultation bilaterally. GI: Soft, nontender, non-distended  MS: No edema; No deformity.  Left thigh hematoma Neuro:  Nonfocal  Vasc:  +2 radial pulses  Patient diagnosed with acute cholecystitis on right upper quadrant ultrasound yesterday.  Hospital medicine service has graciously offered to assume primary team management.  Heparin drip is being held due to left thigh hematoma.  Additionally the patient will likely be getting a IR guided percutaneous drain.  For now we will continue current management.  We will revisit the need for coronary angiography for acute coronary syndrome and worsening cardiomyopathy once medical issues have been stabilized.  Alverda Skeans, MD Pager [phone]

## 2023-03-02 NOTE — Assessment & Plan Note (Addendum)
See longer summary from 8/21. This is multifactorial delirium from NSTEMI, cardiomyopathy and cholecystitis.  Some agitation today with nursing. - Start seroquel 25 nightly - PRN Haldol for agitation - Treat cholecystitis - Standard delirium precautions: blinds open and lights on during day, TV off, minimize interruptions at night, glasses/hearing aids, PT/OT, avoiding Beers list medications

## 2023-03-02 NOTE — Progress Notes (Signed)
Patient has large hematoma involving adductor musculature of left leg. He is hemodynamically stable; this morning's CBC is not yet resulted. Plan to stop IV heparin infusion for now and continue close monitoring.

## 2023-03-02 NOTE — Progress Notes (Signed)
Progress Note   Patient: Nathan [name] SAY:[id number] DOB: September 02, 1945 DOA: 02/20/2023     9 DOS: the patient was seen and examined on 03/02/2023 at 10:38 AM      Brief hospital course: Nathan [name] is a 77 y.o. M with DM, HTN, CAD s/p CABG and AVR, hx OSA, chronic nightmares, and history severe necrotizing fasciitis some years ago who presented with chest discomfort, admitted to Cardiology for NSTEMI.    Subsequent hospitalization complicated by respiratory failure requiring BiPAP, later delirium.    Hospitalist service consulted for persistent delirium and leukocytosis.   Significant events: 8/13: Admitted on heparin to Cardiology for NSTEMI --> evening of HD1, developed respiratory distress and rales requiring BiPAP and ICU transfer 8/14: Developed delirium requiring Precedex; Transaminitis noted 8/15: Cr peaks at 2.15 8/16: Weaned off Precedex; new onset Atrial fibrillation noted 8/17: Work up for leukocytosis started 8/18: Antibiotics started for Pneumonia based on CXR 8/20: Hgb down to 8s, Hospitalists consulted for persistent delirium, leukocytosis 8/21: ID consulted; CT C/A/P shows abnormal gallbladder, new hematoma; heparin stopped 8/22: IR and Gen Surg consulted; HIDA confirms cholecystitis; transfused 2 units   Significant studies: 8/14 echo: EF 20-25% 8/20 echo: EF 25-30% with RWMA, no thrombus; AV repair noted 8/20 CT head: unremarkable 8/21 CT C/A/P: hematoma in groin and cholecystitis 8/21 US abdomen: cholecystitis and gallstone 8/22 HIDA: abnormal   Significant microbiology data: 8/17 blood cx x2: NG 8/19 urine cx: NG 8/20 blood cx x2: NG 8/20 RVP: neg 8/20 COVID: neg    Procedures: 8/22: Percutaneous cholecystostomy tube    Consults: Cardiology General Surgery ID IR         Assessment and Plan: * NSTEMI (non-ST elevated myocardial infarction) (HCC) Completed several days of heparin, which was held 8/21 due to hematoma and worsening  anemia. - Hold aspirin - Continue Lipitor - Continue Imdur, metoprolol - Defer timing of angiography to cardiology   Acute on chronic combined systolic and diastolic CHF (congestive heart failure) (HCC) See prior notes.  Admitted with NSTEMI, found to have EF 25-30%, on hospital day 1 developed dyspnea, rales, bilateral infiltrates and required BiPAP.    Treated with diuretics and appears euvolemic off diuretics at this point. - Continue metoprolol - Defer diuretics and other GDMT to Cardiology    Acute posthemorrhagic anemia Hgb stable at 10 g/dL for first week.  Starting around 8/20, Hgb started to drop.  On CT abdomen/pelvis 8/21, hematoma noted.  No compartment syndrome. - Hold heparin - Transufse 2 units now - Hgb threshold 8 g/dL given CHF, NSTEMI - Post-transfusion Lasix - Trend Hgb  Acute metabolic encephalopathy See longer summary from 8/21. This is multifactorial delirium from NSTEMI, cardiomyopathy and cholecystitis. - Treat cholecystitis - Standard delirium precautions: blinds open and lights on during day, TV off, minimize interruptions at night, glasses/hearing aids, PT/OT, avoiding Beers list medications    Acute cholecystitis LFTs previously interpreted as congestive.  Imaging 8/21 showed signs of gallstones and cholecystitis, confirmed with HIDA. - Consult Gen Surg - Consult IR for perc drain  Paroxysmal atrial fibrillation (HCC) New onset.  CHA2DS2-Vasc 6 (age, HTN, CHF, DM, vascular disease).  Rate controlled at present - Continue metoprolol - Hold anticoagulation  Acute kidney injury on CKD stage 3a, GFR 45-59 ml/min (HCC) Cr unchanged at 2.0 today in setting of CHF, infection. - Daily BMP - Avoid nephrotoxins  Hypokalemia Supplemented and resolved  CKD stage 3a, GFR 45-59 ml/min (HCC) Baseline 1.3-1.5.    Acute respiratory failure with hypoxia (  HCC) On hospital day 1, in setting of CHF.  Resolved  S/P AVR    Dyslipidemia - Continue  Lipitor          Subjective: Patient still somewhat delirious, but no headache, chest pain, dyspnea, swelling.  He has no abdominal pain.  He is mostly just frustrated with being in the hospital, although he is tangential and disorganized in his thought processes.     Physical Exam: BP 110/74 (BP Location: Right Arm)   Pulse (!) 123   Temp 97.7 F (36.5 C) (Oral)   Resp 20   Ht 5\' 7"  (1.702 m)   Wt 68.7 kg   SpO2 99%   BMI 23.72 kg/m   Pale elderly adult male, lying in bed, appears listless and weak Tachycardic, irregular, no murmurs, no peripheral edema Respiratory normal, lungs clear without rales or wheezes Abdomen soft nontender palpation or guarding, no ascites or distention Attention diminished, affect blunted, judgment and insight appear impaired  Data Reviewed: Discussed with general surgery, cardiology, interventional radiology CBC shows white blood cell count up to 20 BMP shows potassium up to normal, normal magnesium Creatinine slightly up to 2.0 Hemoglobin down to 6.8 LFTs stable, unchanged  Family Communication: Wife by phone    Disposition: Status is: Inpatient         Author: Alberteen Sam, MD 03/02/2023 5:21 PM  For on call review [email or web address].

## 2023-03-02 NOTE — Procedures (Addendum)
Interventional Radiology Procedure Note  Procedure: Image guided drain placement, perc chole.  11F pigtail drain.  Complications: None  EBL: None Sample: Culture sent  Recommendations: - Routine drain care, with sterile flushes, record output - follow up Cx - routine wound care - ok for anti-coagulation restart in 2 hours  Signed,  Yvone Neu. Loreta Ave, DO

## 2023-03-02 NOTE — Assessment & Plan Note (Addendum)
Continue with statin therapy.  ?

## 2023-03-02 NOTE — Telephone Encounter (Signed)
[facility] is calling inform PA Micah Flesher that the patient will not start Physical Therapy until 08/28. [facility] stated if there is any questions or concerns to call them at [phone]. Please advise.

## 2023-03-02 NOTE — Progress Notes (Signed)
Lab called with critical hgb 6.2. Danford, MD paged and MD stated he will be up to assess pt shortly and will be placing blood transfusion orders.

## 2023-03-02 NOTE — Assessment & Plan Note (Addendum)
Not noted on admission.  Transaminitis noted early in hospitalization, interpreted as congestive.  Imaging 8/21 showed signs of gallstones and cholecystitis, confirmed with HIDA.  Perc tube placed 8/22 - Cotinue Zosyn  - Plan for 3 weeks antibiotics - Plan for 6 weeks drain, follow up in IR clinic - Drain flushes per IR - Consult ID and IR

## 2023-03-02 NOTE — Assessment & Plan Note (Deleted)
-   Supplemented and resolved 

## 2023-03-02 NOTE — Progress Notes (Signed)
Patient continued to be confused and restless overnight, unable to sleep.  Also, around 0500, complaining of increase pain to L inner thigh.  Assessment of area showed a large diffuse bruise that was firm to touch.  Notified MD on call, who instructed me to place heparin drip on hold.  Also notified pharmacy regarding heparin being held and the justification.

## 2023-03-02 NOTE — Progress Notes (Signed)
PT Cancellation Note  Patient Details Name: Nathan [name] MRN: [id number] DOB: 01-22-1946   Cancelled Treatment:    Reason Eval/Treat Not Completed: Medical issues which prohibited therapy (Nurse stated pt going to Nuc Med as well as pt with critical Hgb awaiting transfusion. Will return at later date.)   Bevelyn Buckles 03/02/2023, 1:20 PM Pina Sirianni M,PT Acute Rehab Services [phone]

## 2023-03-02 NOTE — Progress Notes (Signed)
ANTICOAGULATION CONSULT NOTE- Follow Up  Pharmacy Consult for Heparin  Indication: chest pain/ACS  Allergies  Allergen Reactions   Shellfish-Derived Products Anaphylaxis   Zolpidem Tartrate Other (See Comments)    Hallucinations    Patient Measurements: Height: 5\' 7"  (170.2 cm) Weight: 68.7 kg (151 lb 7.3 oz) IBW/kg (Calculated) : 66.1 Heparin DW = 68 kg  Vital Signs: Temp: 97.5 F (36.4 C) (08/21 1925) Temp Source: Oral (08/21 1925) BP: 110/74 (08/21 1925) Pulse Rate: 82 (08/21 1925)  Labs: Recent Labs    02/28/23 0659 02/28/23 1644 03/01/23 0248 03/01/23 1235 03/01/23 2328  HGB 8.5* 8.2* 7.4*  --   --   HCT 24.0* 22.9* 20.5*  --   --   PLT 202 215 184  --   --   HEPARINUNFRC 0.52  --  0.96* 0.96* 0.59  CREATININE 2.12* 1.96* 1.92*  --   --     Estimated Creatinine Clearance: 30.1 mL/min (A) (by C-G formula based on SCr of 1.92 mg/dL (H)).   Assessment: 77 y/o M with chest pain at rest beginning 8/11, unrelieved by SL NTG, some help with morphine.  Unable to get LHC on 8/14 due to agitation, now awaiting improvement in renal function.  No anticoagulation PTA, note potential LV thrombus but unable to be confirm due to inability to receive contrast.  8/16 ECHO noted inability to exclude thrombus.    8/22 AM update:  Heparin level therapeutic after rate decrease  Goal of Therapy:  Heparin level 0.3-0.7 units/ml Monitor platelets by anticoagulation protocol: Yes   Plan:  -Cont heparin 1000 units/hr -Heparin level with AM labs  Abran Duke, PharmD, BCPS Clinical Pharmacist Phone: [phone]

## 2023-03-02 NOTE — Consult Note (Signed)
Chief Complaint: Patient was seen in consultation today for acute cholecystitis   Referring Physician(s): Joen Laura, MD  Supervising Physician: Gilmer Mor  Patient Status: [facility] - In-pt  History of Present Illness: Momodou Fracasso is a 77 y.o. male with complex PMH including for aortic stenosis s/p aortic valve replacement, CAD s/p CABG, type 2 diabetes mellitus, hx of myocardial infarction, hypertension, and obstructive sleep apnea being seen today in relation acute cholecystitis. Patient initially admitted to Cardiology with NSTEMI on 02/20/23. Patient's hospitalized further complicated by respiratory failure requiring BiPAP and development of delirium. CT CAP on 03/01/23 revealed concern for possible acute cholecystitis. Ultrasound and HIDA scan later positive for acute cholecystitis. IR consulted for image-guided percutaneous cholecystostomy placement.   Past Medical History:  Diagnosis Date   Allergy    Aortic stenosis    Arthritis    Balance problem 04/26/2022   Blood transfusion without reported diagnosis    CAD (coronary artery disease)    Cataract    both eyes surgically removed   Diabetes mellitus, type 2 (HCC)    Diverticulosis of colon (without mention of hemorrhage)    Duodenal ulcer perforation (HCC)    blood transfusion   Flesh-eating bacteria (HCC) 1995   GERD (gastroesophageal reflux disease)    Glaucoma    pre glaucoma on Xalatan drops   Heart murmur    History of diabetic retinopathy    History of gastrointestinal bleeding    History of necrotizing fasciitis    History of prosthetic aortic valve    Hypercholesteremia    Myocardial infarction (HCC) 02/23/2016   Neuropathic pain 04/26/2022   Nightmares    CHRONIC   OSA (obstructive sleep apnea)    borderline no CPAP    Personal history of colonic polyps 11/23/2009   TUBULAR ADENOMA   Postoperative anemia    Sinus bradycardia    Sleep apnea    no cpap   Stomach ulcer    Hx of   Systolic  hypertension    Vitamin B12 deficiency     Past Surgical History:  Procedure Laterality Date   AORTIC VALVE REPLACEMENT     BLEPHAROPLASTY  11-24-14   CARDIAC CATHETERIZATION N/A 03/01/2016   Procedure: Coronary/Graft Angiography;  Surgeon: Peter M Swaziland, MD;  Location: [facility] IN[facility];  Service: Cardiovascular;  Laterality: N/A;   CATARACT EXTRACTION W/ INTRAOCULAR LENS IMPLANT     OD   COLONOSCOPY     CORONARY ARTERY BYPASS GRAFT  2010   x 4   flesh eating disease surgery      surgeries x 7    HERNIA REPAIR  1972   POLYPECTOMY     RETINAL LASER PROCEDURE     SCROTAL SURGERY  1995   resection   UMBILICAL HERNIA REPAIR N/A 04/10/2013   Procedure: HERNIA REPAIR UMBILICAL ADULT with mesh;  Surgeon: Valarie Merino, MD;  Location: [facility];  Service: General;  Laterality: N/A;    Allergies: Shellfish-derived products and Zolpidem tartrate  Medications: Prior to Admission medications   Medication Sig Start Date End Date Taking? Authorizing Provider  allopurinol (ZYLOPRIM) 300 MG tablet Take 300 mg by mouth daily.   Yes [provider]  ascorbic acid (VITAMIN C) 500 MG tablet Take by mouth daily.   Yes [provider]  aspirin 81 MG tablet Take 1 tablet (81 mg total) by mouth daily. Patient taking differently: Take 81 mg by mouth at bedtime. 02/09/16  Yes Jake Bathe, MD  atorvastatin (LIPITOR)  20 MG tablet Take 20 mg by mouth daily. 07/25/15  Yes [provider]  Cholecalciferol (VITAMIN D-3 PO) Take 1 capsule by mouth in the morning and at bedtime.   Yes [provider]  Coenzyme Q10 (COQ10) 100 MG CAPS Take 100 mg by mouth daily.   Yes [provider]  fish oil-omega-3 fatty acids 1000 MG capsule Take by mouth daily.   Yes [provider]  glipiZIDE (GLUCOTROL) 5 MG tablet Take 5 mg by mouth every morning.   Yes [provider]  hydrochlorothiazide (MICROZIDE) 12.5 MG capsule Take 12.5 mg by mouth daily. Reported on  08/20/2015 10/13/10  Yes Cassell Clement, MD  isosorbide mononitrate (IMDUR) 30 MG 24 hr tablet TAKE 1 TABLET (30 MG TOTAL) BY MOUTH DAILY. **DO NOT CRUSH** 07/20/22  Yes Swinyer, Zachary George, NP  metFORMIN (GLUCOPHAGE) 1000 MG tablet Take 1,000 mg by mouth in the morning and at bedtime.   Yes [provider]  metoprolol (LOPRESSOR) 50 MG tablet Take 50 mg by mouth 2 (two) times daily.   Yes [provider]  minoxidil (LONITEN) 10 MG tablet Take 10 mg by mouth daily.  07/20/15  Yes [provider]  VITAMIN A PO Take by mouth daily.   Yes [provider]  acetaminophen (TYLENOL) 500 MG tablet Take 500 mg by mouth daily as needed for moderate pain. Patient not taking: Reported on 02/23/2023    [provider]  ALPRAZolam Prudy Feeler) 0.25 MG tablet Take 0.25 mg by mouth daily as needed for anxiety. Patient not taking: Reported on 02/23/2023    [provider]  amLODipine (NORVASC) 5 MG tablet Take 5 mg by mouth every morning. Patient not taking: Reported on 02/23/2023    [provider]  amoxicillin-clavulanate (AUGMENTIN) 875-125 MG tablet Take 1 tablet by mouth every 12 (twelve) hours. Patient not taking: Reported on 02/23/2023 01/21/23   Evlyn Kanner T, PA-C  Cyanocobalamin (VITAMIN B-12 IJ) Inject 1,000 mcg into the muscle every 30 (thirty) days. Amt/dose is unknown Patient not taking: Reported on 02/23/2023    [provider]  cycloSPORINE (RESTASIS) 0.05 % ophthalmic emulsion Place 1 drop into both eyes 2 (two) times daily. Patient not taking: Reported on 02/23/2023    [provider]  fluorouracil (EFUDEX) 5 % cream Apply topically daily. Patient not taking: Reported on 02/23/2023 06/10/22   [provider]  HYDROcodone-acetaminophen (VICODIN) 5-500 MG per tablet Take 1 tablet by mouth daily as needed for pain.  Patient not taking: Reported on 02/23/2023    [provider]  ketoconazole (NIZORAL) 2 % cream 2  (two) times daily. Patient not taking: Reported on 02/23/2023 06/10/22   [provider]  meloxicam (MOBIC) 15 MG tablet Take 15 mg by mouth as needed for pain. Patient not taking: Reported on 02/23/2023 05/26/22   [provider]  Multiple Vitamin (MULTIVITAMIN) tablet Take 1 tablet by mouth daily. Patient not taking: Reported on 02/23/2023    [provider]  nitroGLYCERIN (NITROSTAT) 0.4 MG SL tablet Place 1 tablet under the tongue as needed. Patient not taking: Reported on 02/23/2023 09/01/16   [provider]  oxyCODONE-acetaminophen (PERCOCET/ROXICET) 5-325 MG tablet Take 1 tablet by mouth daily as needed for moderate pain or severe pain.  Patient not taking: Reported on 02/23/2023    [provider]  trolamine salicylate (ASPERCREME) 10 % cream Apply 1 application topically as needed for muscle pain. Patient not taking: Reported on 02/23/2023    [provider]     Family History  Problem Relation Age of Onset   Cirrhosis Father    Alcohol abuse Father        father murdered   Rectal cancer Mother    Diabetes Mother    Colon cancer Mother 8       rectal cancer   Hypertension Mother    Hypertension Sister    Diabetes Sister    Heart attack Neg Hx    Stroke Neg Hx    Colon polyps Neg Hx    Esophageal cancer Neg Hx    Stomach cancer Neg Hx     Social History   Socioeconomic History   Marital status: Married    Spouse name: Not on file   Number of children: 0   Years of education: Not on file   Highest education level: Not on file  Occupational History   Occupation: Investment banker, corporate: [employer]   Occupation: Retired  Tobacco Use   Smoking status: Never   Smokeless tobacco: Never  Vaping Use   Vaping status: Never Used  Substance and Sexual Activity   Alcohol use: No    Alcohol/week: 0.0 standard drinks of alcohol   Drug use: No   Sexual activity: Not on file  Other Topics Concern   Not on file  Social  History Narrative   Not on file   Social Determinants of Health   Financial Resource Strain: Not on file  Food Insecurity: No Food Insecurity (02/25/2023)   Hunger Vital Sign    Worried About Running Out of Food in the Last Year: Never true    Ran Out of Food in the Last Year: Never true  Transportation Needs: No Transportation Needs (02/25/2023)   PRAPARE - Administrator, Civil Service (Medical): No    Lack of Transportation (Non-Medical): No  Physical Activity: Not on file  Stress: Not on file  Social Connections: Not on file    Code Status: Full code  Review of Systems: A 12 point ROS discussed and pertinent positives are indicated in the HPI above.  All other systems are negative.  Review of Systems  Reason unable to perform ROS: Patient confused on exam, unable to appropriately answer questions.    Vital Signs: BP 95/65 (BP Location: Right Arm)   Pulse 71   Temp 97.7 F (36.5 C) (Oral)   Resp 20   Ht 5\' 7"  (1.702 m)   Wt 151 lb 7.3 oz (68.7 kg)   SpO2 100%   BMI 23.72 kg/m   Advance Care Plan: The advanced care plan/surrogate decision maker was discussed at the time of visit and documented in the medical record.  Patient's surrogate decision maker is his wife, Lew Dawes.  Physical Exam Vitals reviewed.  Constitutional:      General: He is not in acute distress.    Appearance: He is ill-appearing.  HENT:     Mouth/Throat:     Mouth: Mucous membranes are moist.  Eyes:     Pupils: Pupils are equal, round, and reactive to light.  Cardiovascular:     Rate and Rhythm: Normal rate and regular rhythm.     Pulses: Normal pulses.     Heart sounds: Normal heart sounds.  Pulmonary:     Effort: Pulmonary effort is normal.     Breath sounds: Normal breath sounds.  Abdominal:     Palpations: Abdomen is soft.     Tenderness: There is no  abdominal tenderness.  Musculoskeletal:     Right lower leg: No edema.     Left lower leg: No edema.  Skin:     General: Skin is warm and dry.  Neurological:     Mental Status: He is alert. He is disoriented.     Comments: Patient Aox1  Psychiatric:     Comments: Pleasantly delirious     Imaging: NM Hepatobiliary Liver Func  Result Date: 03/02/2023 CLINICAL DATA:  Concern for cholecystitis. RIGHT upper quadrant pain EXAM: NUCLEAR MEDICINE HEPATOBILIARY IMAGING TECHNIQUE: Sequential images of the abdomen were obtained out to 60 minutes following intravenous administration of radiopharmaceutical. RADIOPHARMACEUTICALS:  5.2 mCi Tc-18m  Choletec IV COMPARISON:  CT 03/01/2023 FINDINGS: Prompt clearance radiotracer from blood pool and homogeneous uptake in liver. Counts are evident in the small bowel by 15 minutes. Gallbladder fails to fill at 60 minutes. 2.8 mg IV morphine was administered to augment filling of the gallbladder. Gallbladder fails to fill following morphine augmentation. IMPRESSION: 1. Non filling of the gallbladder consistent with cystic duct obstruction/ACUTE CHOLECYSTITIS. 2. Patent common bile duct. These results will be called to the ordering clinician or representative by the Radiologist Assistant, and communication documented in the PACS or [employer]. Electronically Signed   By: Genevive Bi M.D.   On: 03/02/2023 15:56   US Abdomen Limited RUQ (LIVER/GB)  Result Date: 03/02/2023 CLINICAL DATA:  Findings on CT concerning for acute cholecystitis EXAM: ULTRASOUND ABDOMEN LIMITED RIGHT UPPER QUADRANT COMPARISON:  CT abdomen and pelvis 03/01/2023 FINDINGS: Gallbladder: Mild wall thickening and pericholecystic fluid. Sludge and stones in the gallbladder including in the gallbladder neck measuring up to 1.3 cm. Common bile duct: Diameter: 5 mm.  No intrahepatic biliary dilation. Liver: No focal lesion identified. Within normal limits in parenchymal echogenicity. Portal vein is patent on color Doppler imaging with normal direction of blood flow towards the liver. Other: None. IMPRESSION:  Findings compatible with acute cholecystitis. Electronically Signed   By: Minerva Fester M.D.   On: 03/02/2023 02:46   CT CHEST ABDOMEN PELVIS WO CONTRAST  Result Date: 03/01/2023 CLINICAL DATA:  Chest pain, infection. EXAM: CT CHEST, ABDOMEN AND PELVIS WITHOUT CONTRAST TECHNIQUE: Multidetector CT imaging of the chest, abdomen and pelvis was performed following the standard protocol without IV contrast. RADIATION DOSE REDUCTION: This exam was performed according to the departmental dose-optimization program which includes automated exposure control, adjustment of the mA and/or kV according to patient size and/or use of iterative reconstruction technique. COMPARISON:  CT chest abdomen and pelvis 01/21/2023 FINDINGS: CT CHEST FINDINGS Cardiovascular: Heart is mildly enlarged. Aorta is normal in size. Patient is status post cardiac surgery. There are atherosclerotic calcifications of the aorta and coronary arteries. There is no pericardial effusion. Mediastinum/Nodes: No enlarged mediastinal, hilar, or axillary lymph nodes. Thyroid gland, trachea, and esophagus demonstrate no significant findings. Lungs/Pleura: There are diffuse peripheral scattered reticular opacities throughout both lungs, slightly increased from prior. There is no lung consolidation, pleural effusion or pneumothorax. Musculoskeletal: There are chronic left third, fourth, fifth, sixth rib fractures. There is subacute/acute nondisplaced posterolateral left eighth rib fracture. Sternotomy wires are present. Degenerative changes affect the spine. CT ABDOMEN PELVIS FINDINGS Hepatobiliary: Gallbladder is dilated. There is diffuse gallbladder wall thickening and surrounding inflammatory stranding. Gallstones are present. There is no biliary ductal dilatation. Liver is within normal limits. Pancreas: Unremarkable. No pancreatic ductal dilatation or surrounding inflammatory changes. Spleen: There are calcified granulomas in the spleen. The spleen is  otherwise within normal limits. Adrenals/Urinary Tract: There  is a 4.4 cm left renal cyst. There is no hydronephrosis. Vascular calcifications are noted in both kidneys. The bladder and adrenal glands are within normal limits. Stomach/Bowel: Stomach is within normal limits. Appendix appears normal. No evidence of bowel wall thickening, distention, or inflammatory changes. There is sigmoid and descending colon diverticulosis. Vascular/Lymphatic: Aortic atherosclerosis. No enlarged abdominal or pelvic lymph nodes. Reproductive: Prostate is unremarkable. Other: There is a small fat containing right inguinal hernia. There is mild presacral edema. There is no ascites. Musculoskeletal: No acute fracture or dislocation. Degenerative changes affect the spine. There is incompletely imaged heterogeneous mixed hyper and hypodensity in the region of the adductor musculature measuring 9.0 x 8.6 cm image 3/125. IMPRESSION: 1. Findings compatible with acute cholecystitis. 2. Subacute/acute nondisplaced left eighth rib fracture. 3. Diffuse peripheral reticular opacities throughout both lungs, slightly increased from prior, worrisome for chronic interstitial lung disease. 4. Incompletely imaged heterogeneous mixed hyper and hypodensity in the region of the right adductor musculature measuring 9.0 x 8.6 cm. Findings may represent hematoma, but other etiologies are not excluded. Correlate clinically for infection. 5. Colonic diverticulosis. Aortic Atherosclerosis (ICD10-I70.0). Electronically Signed   By: Darliss Cheney M.D.   On: 03/01/2023 19:25   ECHOCARDIOGRAM LIMITED  Result Date: 03/01/2023    ECHOCARDIOGRAM LIMITED REPORT   Patient Name:   ANVITH HARNISCH Date of Exam: 02/28/2023 Medical Rec #:  [id number]      Height:       67.0 in Accession #:    [id number]     Weight:       151.5 lb Date of Birth:  1946/03/12     BSA:          1.797 m Patient Age:    76 years       BP:           113/57 mmHg Patient Gender: M               HR:           77 bpm. Exam Location:  Inpatient Procedure: Limited Echo, Cardiac Doppler, Color Doppler and Intracardiac            Opacification Agent Indications:    I51.9 Decreased LV function. Rule out LV thrombus.  History:        Patient has prior history of Echocardiogram examinations, most                 recent 02/24/2023. CAD, Prior CABG, Aortic Valve Disease,                 Signs/Symptoms:Chest Pain; Risk Factors:Hypertension, Sleep                 Apnea, Dyslipidemia and Diabetes. AVR.                 Aortic Valve: 23 mm Edwards bioprosthetic valve is present in                 the aortic position.  Sonographer:    Sheralyn Boatman RDCS Referring Phys: [phone] MICHAEL COOPER  Sonographer Comments: Technically difficult study due to poor echo windows. IMPRESSIONS  1. Left ventricular ejection fraction, by estimation, is 25 to 30%. The left ventricle has severely decreased function. The left ventricle demonstrates regional wall motion abnormalities (see scoring diagram/findings for description). There is mild concentric left ventricular hypertrophy. There is global hypokinesis but more severe hypokinesis of the left ventricular, inferior wall, inferoseptal wall and apical segment.  2. The  mitral valve is degenerative. Trivial mitral valve regurgitation. Moderate mitral annular calcification.  3. The aortic valve has been repaired/replaced. There is a 23 mm Edwards bioprosthetic valve present in the aortic position.  4. No evidence of apical LV thrombus by definity contrast. FINDINGS  Left Ventricle: Left ventricular ejection fraction, by estimation, is 25 to 30%. The left ventricle has severely decreased function. The left ventricle demonstrates regional wall motion abnormalities. Definity contrast agent was given IV to delineate the left ventricular endocardial borders. There is mild concentric left ventricular hypertrophy. Mitral Valve: The mitral valve is degenerative in appearance. There is moderate thickening  of the mitral valve leaflet(s). There is moderate calcification of the mitral valve leaflet(s). Moderately decreased mobility of the mitral valve leaflets. Moderate mitral annular calcification. Trivial mitral valve regurgitation. Aortic Valve: The aortic valve has been repaired/replaced. There is a 23 mm Edwards bioprosthetic valve present in the aortic position. Additional Comments: Spectral Doppler performed. Color Doppler performed.  LEFT VENTRICLE PLAX 2D LVIDd:         5.10 cm LVIDs:         4.40 cm LV PW:         1.30 cm LV IVS:        1.20 cm  LV Volumes (MOD) LV vol d, MOD A2C: 143.0 ml LV vol d, MOD A4C: 168.5 ml LV vol s, MOD A2C: 103.0 ml LV vol s, MOD A4C: 116.5 ml LV SV MOD A2C:     40.0 ml LV SV MOD A4C:     168.5 ml LV SV MOD BP:      48.7 ml IVC IVC diam: 2.60 cm Armanda Magic MD Electronically signed by Armanda Magic MD Signature Date/Time: 03/01/2023/8:15:09 AM    Final    CT HEAD WO CONTRAST ( )  Result Date: 03/01/2023 CLINICAL DATA:  Mental status change, unknown cause EXAM: CT HEAD WITHOUT CONTRAST TECHNIQUE: Contiguous axial images were obtained from the base of the skull through the vertex without intravenous contrast. RADIATION DOSE REDUCTION: This exam was performed according to the departmental dose-optimization program which includes automated exposure control, adjustment of the mA and/or kV according to patient size and/or use of iterative reconstruction technique. COMPARISON:  None Available. FINDINGS: Brain: No evidence of acute infarction, hemorrhage, mass, mass effect, or midline shift. No hydrocephalus or extra-axial fluid collection. Vascular: No hyperdense vessel. Atherosclerotic calcifications in the intracranial carotid and vertebral arteries. Skull: Negative for fracture or focal lesion. Sinuses/Orbits: Left maxillary mucous retention cyst. Status post bilateral lens replacements. Other: The mastoid air cells are well aerated. IMPRESSION: No acute intracranial process.  Electronically Signed   By: Wiliam Ke M.D.   On: 03/01/2023 03:32   DG Chest 2 View  Result Date: 02/28/2023 CLINICAL DATA:  Pneumonia. EXAM: CHEST - 2 VIEW COMPARISON:  February 25, 2023. FINDINGS: Stable cardiomegaly. Status post coronary artery bypass graft and aortic valve repair. Lungs are clear. Old left rib fractures are noted. IMPRESSION: No active cardiopulmonary disease. Electronically Signed   By: Lupita Raider M.D.   On: 02/28/2023 13:45   DG CHEST PORT 1 VIEW  Result Date: 02/25/2023 CLINICAL DATA:  Chest pain, cough EXAM: PORTABLE CHEST 1 VIEW COMPARISON:  02/21/2023 FINDINGS: Mild bilateral interstitial thickening with more focal left lower lobe airspace disease. No pleural effusion or pneumothorax. Stable cardiomediastinal silhouette. Prior CABG. No acute osseous abnormality. IMPRESSION: 1. Mild bilateral interstitial thickening with more focal left lower lobe airspace disease concerning for mild pulmonary edema versus pneumonia.  Electronically Signed   By: Elige Ko M.D.   On: 02/25/2023 13:14   ECHOCARDIOGRAM LIMITED  Result Date: 02/24/2023    ECHOCARDIOGRAM LIMITED REPORT   Patient Name:   Raney Schubbe Date of Exam: 02/24/2023 Medical Rec #:  [id number]      Height:       67.0 in Accession #:    [id number]     Weight:       151.5 lb Date of Birth:  [birth year]     BSA:          1.797 m Patient Age:    76 years       BP:           109/82 mmHg Patient Gender: M              HR:           79 bpm. Exam Location:  Inpatient Procedure: Color Doppler, Cardiac Doppler and Limited Echo Indications:    Cardiomyopathy  History:        Patient has prior history of Echocardiogram examinations, most                 recent 02/22/2023. CAD, Prior CABG and Prior Cardiac Surgery,                 Aortic Valve Disease and 23mm Edwards bioprosthetic aortic valve                 in 2010, Signs/Symptoms:Shortness of Breath and Chest Pain; Risk                 Factors:Diabetes.  Sonographer:    Milbert Coulter Referring Phys: Nicki [place], A IMPRESSIONS  1. Diffuse hypokinesis worse in the inferior, inferoseptal and apical walls; distal inferior akinesis. Cannot completely exlude mural thrombus at apex, recomm limited echo wit Definity to confirm . Left ventricular ejection fraction, by estimation, is 25 to 30%. The left ventricle has severely decreased function. Left ventricular diastolic parameters are indeterminate.  2. Right ventricular systolic function is normal. The right ventricular size is normal.  3. Left atrial size was mildly dilated.  4. Mild mitral valve regurgitation. Moderate to severe mitral annular calcification.  5. S/p AVR (23 mm Edwards Bioprosthesis; procedure date 2010) Peak and mean gradients through the valve are 14 and 7 mm respectively. . The aortic valve has been repaired/replaced. Aortic valve regurgitation is not visualized.  6. The inferior vena cava is dilated in size with <50% respiratory variability, suggesting right atrial pressure of 15 mmHg. FINDINGS  Left Ventricle: Diffuse hypokinesis worse in the inferior, inferoseptal and apical walls; distal inferior akinesis. Cannot completely exlude mural thrombus at apex, recomm limited echo wit Definity to confirm. Left ventricular ejection fraction, by estimation, is 25 to 30%. The left ventricle has severely decreased function. The left ventricular internal cavity size was normal in size. Left ventricular diastolic parameters are indeterminate. Right Ventricle: The right ventricular size is normal. Right ventricular systolic function is normal. Left Atrium: Left atrial size was mildly dilated. Right Atrium: Right atrial size was normal in size. Pericardium: There is no evidence of pericardial effusion. Mitral Valve: There is mild thickening of the mitral valve leaflet(s). Moderate to severe mitral annular calcification. Mild mitral valve regurgitation. Tricuspid Valve: The tricuspid valve is normal in structure. Tricuspid valve  regurgitation is mild. Aortic Valve: S/p AVR (23 mm Edwards Bioprosthesis; procedure date 2010) Peak and mean gradients through the valve are 14 and 7  mm respectively. The aortic valve has been repaired/replaced. Aortic valve regurgitation is not visualized. Aortic valve mean gradient measures 7.5 mmHg. Aortic valve peak gradient measures 14.8 mmHg. Aortic valve area, by VTI measures 0.88 cm. Aorta: Aortic root could not be assessed. Venous: The inferior vena cava is dilated in size with less than 50% respiratory variability, suggesting right atrial pressure of 15 mmHg. IAS/Shunts: No atrial level shunt detected by color flow Doppler. Additional Comments: Spectral Doppler performed. Color Doppler performed.  LEFT VENTRICLE PLAX 2D LVOT diam:     1.60 cm   Diastology LV SV:         27        LV e' medial:  3.73 cm/s LV SV Index:   15        LV e' lateral: 7.22 cm/s LVOT Area:     2.01 cm  RIGHT VENTRICLE RV S prime:     9.25 cm/s TAPSE (M-mode): 1.1 cm LEFT ATRIUM             Index        RIGHT ATRIUM           Index LA Vol (A2C):   71.5 ml 39.79 ml/m  RA Area:     16.10 cm LA Vol (A4C):   58.0 ml 32.28 ml/m  RA Volume:   37.00 ml  20.59 ml/m LA Biplane Vol: 66.1 ml 36.79 ml/m  AORTIC VALVE AV Area (Vmax):    0.83 cm AV Area (Vmean):   0.84 cm AV Area (VTI):     0.88 cm AV Vmax:           192.50 cm/s AV Vmean:          126.000 cm/s AV VTI:            0.302 m AV Peak Grad:      14.8 mmHg AV Mean Grad:      7.5 mmHg LVOT Vmax:         79.70 cm/s LVOT Vmean:        52.400 cm/s LVOT VTI:          0.132 m LVOT/AV VTI ratio: 0.44  SHUNTS Systemic VTI:  0.13 m Systemic Diam: 1.60 cm Dietrich Pates MD Electronically signed by Dietrich Pates MD Signature Date/Time: 02/24/2023/5:47:57 PM    Final    ECHOCARDIOGRAM COMPLETE  Result Date: 02/22/2023    ECHOCARDIOGRAM REPORT   Patient Name:   PEYTON FRIGO Date of Exam: 02/22/2023 Medical Rec #:  [id number]      Height:       67.0 in Accession #:    [id number]     Weight:        144.8 lb Date of Birth:  1945/09/01     BSA:          1.763 m Patient Age:    76 years       BP:           89/59 mmHg Patient Gender: M              HR:           127 bpm. Exam Location:  Inpatient Procedure: 2D Echo, Cardiac Doppler and Color Doppler Indications:    R07.9* Chest pain, unspecified  History:        Patient has prior history of Echocardiogram examinations, most                 recent 08/21/2020. CAD, Prior CABG,  Aortic Valve Disease,                 Signs/Symptoms:Shortness of Breath, Chest Pain and Dyspnea; Risk                 Factors:Diabetes. AVR.                 Aortic Valve: 23 mm Edwards bioprosthetic valve is present in                 the aortic position. Procedure Date: 2010.  Sonographer:    Sheralyn Boatman RDCS Referring Phys: [id number] [facility]  Sonographer Comments: Image acquisition challenging due to patient behavioral factors. and Image acquisition challenging due to uncooperative patient. Patient rolled away from probe, in 4 piont restraints. IMPRESSIONS  1. Only limited images obtained; pt refused to continue study after initial apical images obtained; doppler of aortic valve not complete.  2. Left ventricular ejection fraction, by estimation, is 20 to 25%. The left ventricle has severely decreased function. The left ventricle demonstrates global hypokinesis. The left ventricular internal cavity size was mildly dilated. There is mild left ventricular hypertrophy. Left ventricular diastolic function could not be evaluated.  3. Right ventricular systolic function is moderately reduced. The right ventricular size is normal.  4. Left atrial size was moderately dilated.  5. The mitral valve is degenerative. Mild mitral valve regurgitation. No evidence of mitral stenosis. Moderate mitral annular calcification.  6. The aortic valve has been repaired/replaced. Aortic valve regurgitation is not visualized. There is a 23 mm Edwards bioprosthetic valve present in the aortic position.  Procedure Date: 2010.  7. Aortic dilatation noted. There is mild dilatation of the ascending aorta, measuring 40 mm. FINDINGS  Left Ventricle: Left ventricular ejection fraction, by estimation, is 20 to 25%. The left ventricle has severely decreased function. The left ventricle demonstrates global hypokinesis. The left ventricular internal cavity size was mildly dilated. There is mild left ventricular hypertrophy. Left ventricular diastolic function could not be evaluated due to atrial fibrillation. Left ventricular diastolic function could not be evaluated. Right Ventricle: The right ventricular size is normal. Right ventricular systolic function is moderately reduced. Left Atrium: Left atrial size was moderately dilated. Right Atrium: Right atrial size was normal in size. Pericardium: There is no evidence of pericardial effusion. Mitral Valve: The mitral valve is degenerative in appearance. Moderate mitral annular calcification. Mild mitral valve regurgitation. No evidence of mitral valve stenosis. Tricuspid Valve: The tricuspid valve is normal in structure. Tricuspid valve regurgitation is mild . No evidence of tricuspid stenosis. Aortic Valve: The aortic valve has been repaired/replaced. Aortic valve regurgitation is not visualized. There is a 23 mm Edwards bioprosthetic valve present in the aortic position. Procedure Date: 2010. Pulmonic Valve: The pulmonic valve was normal in structure. Pulmonic valve regurgitation is trivial. No evidence of pulmonic stenosis. Aorta: Aortic dilatation noted. There is mild dilatation of the ascending aorta, measuring 40 mm. IAS/Shunts: The interatrial septum was not assessed. Additional Comments: Only limited images obtained; pt refused to continue study after initial apical images obtained; doppler of aortic valve not complete.  LEFT VENTRICLE PLAX 2D LVIDd:         5.50 cm LVIDs:         5.00 cm LV PW:         1.30 cm LV IVS:        1.10 cm LVOT diam:     2.10 cm LVOT Area:      3.46 cm  LEFT ATRIUM           Index LA diam:      4.60 cm 2.61 cm/m LA Vol (A4C): 54.3 ml 30.80 ml/m   AORTA Ao Root diam: 3.60 cm Ao Asc diam:  3.90 cm  SHUNTS Systemic Diam: 2.10 cm Olga Millers MD Electronically signed by Olga Millers MD Signature Date/Time: 02/22/2023/8:44:24 AM    Final    DG CHEST PORT 1 VIEW  Result Date: 02/21/2023 CLINICAL DATA:  Shortness of breath. EXAM: PORTABLE CHEST 1 VIEW COMPARISON:  Radiograph yesterday, CT 01/21/2023 FINDINGS: Patient is rotated. Prior median sternotomy. Cardiac valve replacement. Stable cardiomegaly. Unchanged mediastinal contours. Increasing linear opacity in the right mid lung may represent fluid in the fissure. Suspect small pleural effusions. Progressive interstitial thickening which may represent pulmonary edema superimposed on chronic lung disease. No pneumothorax. IMPRESSION: 1. Progressive interstitial thickening which may represent pulmonary edema superimposed on chronic lung disease. 2. Suspect small pleural effusions. 3. Stable cardiomegaly. Electronically Signed   By: Narda Rutherford M.D.   On: 02/21/2023 17:33   DG Chest Port 1 View  Result Date: 02/20/2023 CLINICAL DATA:  Chest pain EXAM: PORTABLE CHEST 1 VIEW COMPARISON:  CT chest dated 01/21/2023 FINDINGS: Mild subpleural patchy opacities in the lungs bilaterally, favoring mild chronic interstitial lung disease when correlating with prior CT chest, although atypical infection is possible. No pleural effusion or pneumothorax. Cardiomegaly. Prosthetic valve. Thoracic aortic atherosclerosis. Postsurgical changes related to prior CABG. Median sternotomy. IMPRESSION: Mild subpleural patchy opacities, favoring mild chronic interstitial lung disease, although atypical infection is possible. Electronically Signed   By: Charline Bills M.D.   On: 02/20/2023 21:39    Labs:  CBC: Recent Labs    02/28/23 0659 02/28/23 1644 03/01/23 0248 03/02/23 0942  WBC 19.9* 20.8* 19.6* 20.5*   HGB 8.5* 8.2* 7.4* 6.2*  HCT 24.0* 22.9* 20.5* 17.9*  PLT 202 215 184 226    COAGS: Recent Labs    02/23/23 1616  APTT 36    BMP: Recent Labs    02/28/23 0659 02/28/23 1644 03/01/23 0248 03/02/23 0942  NA 138 137 136 139  K 3.3* 3.0* 2.8* 3.7  CL 101 100 102 106  CO2 20* 22 20* 19*  GLUCOSE 155* 199* 143* 179*  BUN 48* 45* 45* 54*  CALCIUM 8.4* 8.5* 7.9* 8.3*  CREATININE 2.12* 1.96* 1.92* 2.01*  GFRNONAA 31* 35* 35* 34*    LIVER FUNCTION TESTS: Recent Labs    02/27/23 0727 02/28/23 0659 02/28/23 1644 03/02/23 0942  BILITOT 2.5* 2.5* 2.4* 1.8*  AST 67* 72* 73* 71*  ALT 63* 58* 60* 52*  ALKPHOS 79 81 74 64  PROT 6.0* 5.8* 6.1* 5.8*  ALBUMIN 2.3* 2.2* 2.2* 2.2*    TUMOR MARKERS: No results for input(s): "AFPTM", "CEA", "CA199", "CHROMGRNA" in the last 8760 hours.  Assessment and Plan:  Salman Bortner is a 77 yo male being seen today in relation to newly discovered acute cholecystitis. CT, [place], and HIDA scan concerning for acute cholecystitis. Patient's case complicated by significant anemia of 6.2 and waxing and waning hospital delirium. Imaging and case have been reviewed and approved for image-guided percutaneous cholecystostomy by Dr Loreta Ave. Patient currently receiving Unasyn. Hgb 6.2, PT/INR pending.   Risks and benefits discussed with the patient including, but not limited to bleeding, infection, gallbladder perforation, bile leak, sepsis or even death.  All of the patient's questions were answered, patient is agreeable to proceed. Consent signed and in chart.   Thank you  for this interesting consult.  I greatly enjoyed meeting Azhar Subia and look forward to participating in their care.  A copy of this report was sent to the requesting provider on this date.  Electronically Signed: Kennieth Francois, PA-C 03/02/2023, 4:32 PM   I spent a total of 40 Minutes  in face to face in clinical consultation, greater than 50% of which was  counseling/coordinating care for acute cholecystitis.

## 2023-03-02 NOTE — Assessment & Plan Note (Addendum)
See prior notes.  Admitted with NSTEMI, found to have EF 25-30%.  On hospital day 1 developed dyspnea, rales, bilateral infiltrates and required BiPAP.    Treated with diuretics and appears euvolemic off diuretics at this point. - Continue metoprolol - Defer diuretics and other GDMT to Cardiology

## 2023-03-02 NOTE — Assessment & Plan Note (Addendum)
Coronary artery disease sp CABG 2010 Echocardiogram with new reduction on LV systolic function, to 20 to 25%, global hypokinesis.   Coronary angiography initially cancelled due to volume overload and delirium.  Currently no further invasive testing planned due to persistent delirium and development of renal failure.  Holding anticoagulation today due to worsening anemia.  Continue with aspirin, statin, isosorbide and metoprolol.  Not reported current chest pain.

## 2023-03-02 NOTE — Consult Note (Signed)
Nathan [name] [birth year]  [id number].    Requesting MD: Dr. Joen Laura Chief Complaint/Reason for Consult: Possible Cholecystitis   HPI: Nathan [name] is a 77 y.o. male with a history of CAD s/p CABG, DM2, OSA, aortic stenosis s/p AVR who presented to ED on 8/12 for chest pain.  Initially admitted by cardiology NSTEMI.  Hospital admission has been complicated by fluid/volume overload from CHF that is required ICU stay.  Echo showed EF 20-25% with severely decreased LV function and global hypokinesis of the LV.  He also has developed new onset atrial fibrillation and was started on heparin gtt.  Patient was noted to have hospital delirium/encephalopathy and rising leukocytosis.  He was started on Zosyn for possible HCAP versus UTI.  TRH was consulted on 8/20.  ID was consulted on 8/21. CT CAP was obtained and showed possible acute cholecystitis. RUQ [place] was obtained that showed mild gallbladder wall thickening and pericholecystitis fluid with sludge and stones in the gallbladder including a stone in the gallbladder neck.   Patient is currently afebrile without tachycardia or hypotension.  His last BP was 95/65.  Last lactic acid within normal limits.  WBC 20.5.  Hemoglobin 6.2.  He is currently getting PRBCs. AST 71, ALT 52, Alk Phos 64, T. Bili 1.8. Hepatitis panel negative. BC NGTD.   Patient is A&O x 4 currently and his wife is at bedside.  His history is very hard to obtain as he does very in his statements during history taking.  It appears that he has had abdominal pain over the last several days that seems to be most in the left upper quadrant.  He initially denied any RUQ pain but later stated that he had some. His pain is worse after eating per his wife's report.  No nausea or vomiting.  Last BM 2 days ago.  No frequent history of NSAID use.  No alcohol use.  No history of issues with his gallbladder or pancreas in the past.  Remote history of prior umbilical hernia  repair.  ROS: ROS As above, see hpi  Family History  Problem Relation Age of Onset   Cirrhosis Father    Alcohol abuse Father        father murdered   Rectal cancer Mother    Diabetes Mother    Colon cancer Mother 47       rectal cancer   Hypertension Mother    Hypertension Sister    Diabetes Sister    Heart attack Neg Hx    Stroke Neg Hx    Colon polyps Neg Hx    Esophageal cancer Neg Hx    Stomach cancer Neg Hx     Past Medical History:  Diagnosis Date   Allergy    Aortic stenosis    Arthritis    Balance problem 04/26/2022   Blood transfusion without reported diagnosis    CAD (coronary artery disease)    Cataract    both eyes surgically removed   Diabetes mellitus, type 2 (HCC)    Diverticulosis of colon (without mention of hemorrhage)    Duodenal ulcer perforation (HCC)    blood transfusion   Flesh-eating bacteria (HCC) 1995   GERD (gastroesophageal reflux disease)    Glaucoma    pre glaucoma on Xalatan drops   Heart murmur    History of diabetic retinopathy    History of gastrointestinal bleeding    History of necrotizing fasciitis    History of prosthetic aortic valve  Hypercholesteremia    Myocardial infarction (HCC) 02/23/2016   Neuropathic pain 04/26/2022   Nightmares    CHRONIC   OSA (obstructive sleep apnea)    borderline no CPAP    Personal history of colonic polyps 11/23/2009   TUBULAR ADENOMA   Postoperative anemia    Sinus bradycardia    Sleep apnea    no cpap   Stomach ulcer    Hx of   Systolic hypertension    Vitamin B12 deficiency     Past Surgical History:  Procedure Laterality Date   AORTIC VALVE REPLACEMENT     BLEPHAROPLASTY  11-24-14   CARDIAC CATHETERIZATION N/A 03/01/2016   Procedure: Coronary/Graft Angiography;  Surgeon: Peter M Swaziland, MD;  Location: [facility] INVASIVE CV LAB;  Service: Cardiovascular;  Laterality: N/A;   CATARACT EXTRACTION W/ INTRAOCULAR LENS IMPLANT     OD   COLONOSCOPY     CORONARY ARTERY BYPASS GRAFT   2010   x 4   flesh eating disease surgery      surgeries x 7    HERNIA REPAIR  1972   POLYPECTOMY     RETINAL LASER PROCEDURE     SCROTAL SURGERY  1995   resection   UMBILICAL HERNIA REPAIR N/A 04/10/2013   Procedure: HERNIA REPAIR UMBILICAL ADULT with mesh;  Surgeon: Valarie Merino, MD;  Location: [facility];  Service: General;  Laterality: N/A;    Social History:  reports that he has never smoked. He has never used smokeless tobacco. He reports that he does not drink alcohol and does not use drugs.  Allergies:  Allergies  Allergen Reactions   Shellfish-Derived Products Anaphylaxis   Zolpidem Tartrate Other (See Comments)    Hallucinations    Medications Prior to Admission  Medication Sig Dispense Refill   allopurinol (ZYLOPRIM) 300 MG tablet Take 300 mg by mouth daily.     ascorbic acid (VITAMIN C) 500 MG tablet Take by mouth daily.     aspirin 81 MG tablet Take 1 tablet (81 mg total) by mouth daily. (Patient taking differently: Take 81 mg by mouth at bedtime.)     atorvastatin (LIPITOR) 20 MG tablet Take 20 mg by mouth daily.  3   Cholecalciferol (VITAMIN D-3 PO) Take 1 capsule by mouth in the morning and at bedtime.     Coenzyme Q10 (COQ10) 100 MG CAPS Take 100 mg by mouth daily.     fish oil-omega-3 fatty acids 1000 MG capsule Take by mouth daily.     glipiZIDE (GLUCOTROL) 5 MG tablet Take 5 mg by mouth every morning.     hydrochlorothiazide (MICROZIDE) 12.5 MG capsule Take 12.5 mg by mouth daily. Reported on 08/20/2015     isosorbide mononitrate (IMDUR) 30 MG 24 hr tablet TAKE 1 TABLET (30 MG TOTAL) BY MOUTH DAILY. **DO NOT CRUSH** 90 tablet 3   metFORMIN (GLUCOPHAGE) 1000 MG tablet Take 1,000 mg by mouth in the morning and at bedtime.     metoprolol (LOPRESSOR) 50 MG tablet Take 50 mg by mouth 2 (two) times daily.     minoxidil (LONITEN) 10 MG tablet Take 10 mg by mouth daily.   5   VITAMIN A PO Take by mouth daily.     acetaminophen (TYLENOL) 500 MG tablet Take 500 mg by  mouth daily as needed for moderate pain. (Patient not taking: Reported on 02/23/2023)     ALPRAZolam (XANAX) 0.25 MG tablet Take 0.25 mg by mouth daily as needed for anxiety. (Patient not taking: Reported  on 02/23/2023)     amLODipine (NORVASC) 5 MG tablet Take 5 mg by mouth every morning. (Patient not taking: Reported on 02/23/2023)     amoxicillin-clavulanate (AUGMENTIN) 875-125 MG tablet Take 1 tablet by mouth every 12 (twelve) hours. (Patient not taking: Reported on 02/23/2023) 14 tablet 0   Cyanocobalamin (VITAMIN B-12 IJ) Inject 1,000 mcg into the muscle every 30 (thirty) days. Amt/dose is unknown (Patient not taking: Reported on 02/23/2023)     cycloSPORINE (RESTASIS) 0.05 % ophthalmic emulsion Place 1 drop into both eyes 2 (two) times daily. (Patient not taking: Reported on 02/23/2023)     fluorouracil (EFUDEX) 5 % cream Apply topically daily. (Patient not taking: Reported on 02/23/2023)     HYDROcodone-acetaminophen (VICODIN) 5-500 MG per tablet Take 1 tablet by mouth daily as needed for pain.  (Patient not taking: Reported on 02/23/2023)     ketoconazole (NIZORAL) 2 % cream 2 (two) times daily. (Patient not taking: Reported on 02/23/2023)     meloxicam (MOBIC) 15 MG tablet Take 15 mg by mouth as needed for pain. (Patient not taking: Reported on 02/23/2023)     Multiple Vitamin (MULTIVITAMIN) tablet Take 1 tablet by mouth daily. (Patient not taking: Reported on 02/23/2023)     nitroGLYCERIN (NITROSTAT) 0.4 MG SL tablet Place 1 tablet under the tongue as needed. (Patient not taking: Reported on 02/23/2023)  12   oxyCODONE-acetaminophen (PERCOCET/ROXICET) 5-325 MG tablet Take 1 tablet by mouth daily as needed for moderate pain or severe pain.  (Patient not taking: Reported on 02/23/2023)     trolamine salicylate (ASPERCREME) 10 % cream Apply 1 application topically as needed for muscle pain. (Patient not taking: Reported on 02/23/2023)       Physical Exam: Blood pressure 95/65, pulse 71, temperature 97.7  F (36.5 C), temperature source Oral, resp. rate 20, height 5\' 7"  (1.702 m), weight 68.7 kg, SpO2 100%. Gen:  Alert, NAD, pleasant Card:  Reg rate Pulm:  Rate and effort normal Abd: Soft, ND, completely NT.  Psych: A&Ox4   Results for orders placed or performed during the hospital encounter of 02/20/23 (from the past 48 hour(s))  Glucose, capillary     Status: Abnormal   Collection Time: 02/28/23 11:39 AM  Result Value Ref Range   Glucose-Capillary 140 (H) 70 - 99 mg/dL    Comment: Glucose reference range applies only to samples taken after fasting for at least 8 hours.  Glucose, capillary     Status: Abnormal   Collection Time: 02/28/23  4:03 PM  Result Value Ref Range   Glucose-Capillary 214 (H) 70 - 99 mg/dL    Comment: Glucose reference range applies only to samples taken after fasting for at least 8 hours.  Hepatitis panel, acute     Status: None   Collection Time: 02/28/23  4:44 PM  Result Value Ref Range   Hepatitis B Surface Ag NON REACTIVE NON REACTIVE   HCV Ab NON REACTIVE NON REACTIVE    Comment: (NOTE) Nonreactive HCV antibody screen is consistent with no HCV infections,  unless recent infection is suspected or other evidence exists to indicate HCV infection.     Hep A IgM NON REACTIVE NON REACTIVE   Hep B C IgM NON REACTIVE NON REACTIVE    Comment: Performed at [facility], [address] [place], [place] [address]  Culture, blood (x 2)     Status: None (Preliminary result)   Collection Time: 02/28/23  4:44 PM   Specimen: BLOOD LEFT ARM  Result Value Ref Range   Specimen Description BLOOD LEFT ARM    Special Requests      BOTTLES DRAWN AEROBIC AND ANAEROBIC Blood Culture adequate volume   Culture      NO GROWTH 2 DAYS Performed at [facility], [address], [place], [place] [address]    Report Status PENDING   CBC with Differential     Status: Abnormal   Collection Time: 02/28/23  4:44 PM  Result Value Ref Range   WBC 20.8 (H) 4.0 - 10.5  K/uL   RBC 2.86 (L) 4.22 - 5.81 MIL/uL   Hemoglobin 8.2 (L) 13.0 - 17.0 g/dL   HCT 24.4 (L) 01.0 - 27.2 %   MCV 80.1 80.0 - 100.0 fL   MCH 28.7 26.0 - 34.0 pg   MCHC 35.8 30.0 - 36.0 g/dL   RDW 53.6 (H) 64.4 - 03.4 %   Platelets 215 150 - 400 K/uL   nRBC 0.3 (H) 0.0 - 0.2 %   Neutrophils Relative % 77 %   Neutro Abs 16.1 (H) 1.7 - 7.7 K/uL   Lymphocytes Relative 6 %   Lymphs Abs 1.2 0.7 - 4.0 K/uL   Monocytes Relative 10 %   Monocytes Absolute 2.1 (H) 0.1 - 1.0 K/uL   Eosinophils Relative 0 %   Eosinophils Absolute 0.0 0.0 - 0.5 K/uL   Basophils Relative 0 %   Basophils Absolute 0.1 0.0 - 0.1 K/uL   Immature Granulocytes 7 %   Abs Immature Granulocytes 1.41 (H) 0.00 - 0.07 K/uL    Comment: Performed at [facility], [address] [place], [place] [address]  Comprehensive metabolic panel     Status: Abnormal   Collection Time: 02/28/23  4:44 PM  Result Value Ref Range   Sodium 137 135 - 145 mmol/L   Potassium 3.0 (L) 3.5 - 5.1 mmol/L   Chloride 100 98 - 111 mmol/L   CO2 22 22 - 32 mmol/L   Glucose, Bld 199 (H) 70 - 99 mg/dL    Comment: Glucose reference range applies only to samples taken after fasting for at least 8 hours.   BUN 45 (H) 8 - 23 mg/dL   Creatinine, Ser 5.63 (H) 0.61 - 1.24 mg/dL   Calcium 8.5 (L) 8.9 - 10.3 mg/dL   Total Protein 6.1 (L) 6.5 - 8.1 g/dL   Albumin 2.2 (L) 3.5 - 5.0 g/dL   AST 73 (H) 15 - 41 U/L   ALT 60 (H) 0 - 44 U/L   Alkaline Phosphatase 74 38 - 126 U/L   Total Bilirubin 2.4 (H) 0.3 - 1.2 mg/dL   GFR, Estimated 35 (L) >60 mL/min    Comment: (NOTE) Calculated using the CKD-EPI Creatinine Equation (2021)    Anion gap 15 5 - 15    Comment: Performed at [facility], [address], [place], [place] [address]  Lactic acid, plasma     Status: None   Collection Time: 02/28/23  4:44 PM  Result Value Ref Range   Lactic Acid, Venous 1.5 0.5 - 1.9 mmol/L    Comment: Performed at [facility], [address] [place],  [place] [address]  Culture, blood (x 2)     Status: None (Preliminary result)   Collection Time: 02/28/23  4:58 PM   Specimen: BLOOD LEFT ARM  Result Value Ref Range   Specimen Description BLOOD LEFT ARM    Special Requests      BOTTLES  DRAWN AEROBIC AND ANAEROBIC Blood Culture results may not be optimal due to an excessive volume of blood received in culture bottles   Culture      NO GROWTH 2 DAYS Performed at [facility], [address], [place], [place] [address]    Report Status PENDING   Respiratory (~20 pathogens) panel by PCR     Status: None   Collection Time: 02/28/23  6:10 PM   Specimen: Nasopharyngeal Swab; Respiratory  Result Value Ref Range   Adenovirus NOT DETECTED NOT DETECTED   Coronavirus 229E NOT DETECTED NOT DETECTED    Comment: (NOTE) The Coronavirus on the Respiratory Panel, DOES NOT test for the novel  Coronavirus (2019 nCoV)    Coronavirus HKU1 NOT DETECTED NOT DETECTED   Coronavirus NL63 NOT DETECTED NOT DETECTED   Coronavirus OC43 NOT DETECTED NOT DETECTED   Metapneumovirus NOT DETECTED NOT DETECTED   Rhinovirus / Enterovirus NOT DETECTED NOT DETECTED   Influenza A NOT DETECTED NOT DETECTED   Influenza B NOT DETECTED NOT DETECTED   Parainfluenza Virus 1 NOT DETECTED NOT DETECTED   Parainfluenza Virus 2 NOT DETECTED NOT DETECTED   Parainfluenza Virus 3 NOT DETECTED NOT DETECTED   Parainfluenza Virus 4 NOT DETECTED NOT DETECTED   Respiratory Syncytial Virus NOT DETECTED NOT DETECTED   Bordetella pertussis NOT DETECTED NOT DETECTED   Bordetella Parapertussis NOT DETECTED NOT DETECTED   Chlamydophila pneumoniae NOT DETECTED NOT DETECTED   Mycoplasma pneumoniae NOT DETECTED NOT DETECTED    Comment: Performed at [facility], [address] [place], [place] [address]  Lactic acid, plasma     Status: Abnormal   Collection Time: 02/28/23  6:30 PM  Result Value Ref Range   Lactic Acid, Venous 2.1 (HH) 0.5 - 1.9 mmol/L    Comment: CRITICAL RESULT CALLED  TO, READ BACK BY AND VERIFIED WITH E. Dala Dock, RN @ [phone] 02/28/23 BY [facility] Performed at [facility], [address], [place], [place] [address]   Glucose, capillary     Status: Abnormal   Collection Time: 02/28/23  9:09 PM  Result Value Ref Range   Glucose-Capillary 125 (H) 70 - 99 mg/dL    Comment: Glucose reference range applies only to samples taken after fasting for at least 8 hours.  CBC     Status: Abnormal   Collection Time: 03/01/23  2:48 AM  Result Value Ref Range   WBC 19.6 (H) 4.0 - 10.5 K/uL   RBC 2.51 (L) 4.22 - 5.81 MIL/uL   Hemoglobin 7.4 (L) 13.0 - 17.0 g/dL   HCT 86.5 (L) 78.4 - 69.6 %   MCV 81.7 80.0 - 100.0 fL   MCH 29.5 26.0 - 34.0 pg   MCHC 36.1 (H) 30.0 - 36.0 g/dL   RDW 29.5 (H) 28.4 - 13.2 %   Platelets 184 150 - 400 K/uL   nRBC 0.3 (H) 0.0 - 0.2 %    Comment: Performed at [facility], [address], [place], [place] [address]  Heparin level (unfractionated)     Status: Abnormal   Collection Time: 03/01/23  2:48 AM  Result Value Ref Range   Heparin Unfractionated 0.96 (H) 0.30 - 0.70 IU/mL    Comment: (NOTE) The clinical reportable range upper limit is being lowered to >1.10 to align with the FDA approved guidance for the current laboratory assay.  If heparin results are below expected values, and patient dosage has  been confirmed, suggest follow up testing of  antithrombin III levels. Performed at [facility], [address] [place], [place] [address]   Basic metabolic panel     Status: Abnormal   Collection Time: 03/01/23  2:48 AM  Result Value Ref Range   Sodium 136 135 - 145 mmol/L   Potassium 2.8 (L) 3.5 - 5.1 mmol/L   Chloride 102 98 - 111 mmol/L   CO2 20 (L) 22 - 32 mmol/L   Glucose, Bld 143 (H) 70 - 99 mg/dL    Comment: Glucose reference range applies only to samples taken after fasting for at least 8 hours.   BUN 45 (H) 8 - 23 mg/dL   Creatinine, Ser 1.91 (H) 0.61 - 1.24 mg/dL   Calcium 7.9 (L) 8.9 - 10.3 mg/dL    GFR, Estimated 35 (L) >60 mL/min    Comment: (NOTE) Calculated using the CKD-EPI Creatinine Equation (2021)    Anion gap 14 5 - 15    Comment: Performed at [facility], [address], [place], [place] [address]  Glucose, capillary     Status: Abnormal   Collection Time: 03/01/23  7:22 AM  Result Value Ref Range   Glucose-Capillary 159 (H) 70 - 99 mg/dL    Comment: Glucose reference range applies only to samples taken after fasting for at least 8 hours.  SARS Coronavirus 2 by RT PCR (hospital order, performed in [facility] hospital lab) *cepheid single result test* Anterior Nasal Swab     Status: None   Collection Time: 03/01/23 11:19 AM   Specimen: Anterior Nasal Swab  Result Value Ref Range   SARS Coronavirus 2 by RT PCR NEGATIVE NEGATIVE    Comment: Performed at [facility] [place] [facility], [address] [place], [place] [address]  Glucose, capillary     Status: Abnormal   Collection Time: 03/01/23 12:12 PM  Result Value Ref Range   Glucose-Capillary 162 (H) 70 - 99 mg/dL    Comment: Glucose reference range applies only to samples taken after fasting for at least 8 hours.  Heparin level (unfractionated)     Status: Abnormal   Collection Time: 03/01/23 12:35 PM  Result Value Ref Range   Heparin Unfractionated 0.96 (H) 0.30 - 0.70 IU/mL    Comment: (NOTE) The clinical reportable range upper limit is being lowered to >1.10 to align with the FDA approved guidance for the current laboratory assay.  If heparin results are below expected values, and patient dosage has  been confirmed, suggest follow up testing of antithrombin III levels. Performed at [facility], [address] [place], [place] [address]   Lactic acid, plasma     Status: None   Collection Time: 03/01/23 12:35 PM  Result Value Ref Range   Lactic Acid, Venous 1.5 0.5 - 1.9 mmol/L    Comment: Performed at [facility], [address], [place], [place] [address]  Glucose, capillary     Status: Abnormal    Collection Time: 03/01/23  3:36 PM  Result Value Ref Range   Glucose-Capillary 169 (H) 70 - 99 mg/dL    Comment: Glucose reference range applies only to samples taken after fasting for at least 8 hours.  Lactic acid, plasma     Status: None   Collection Time: 03/01/23  3:56 PM  Result Value Ref Range   Lactic Acid, Venous 1.6 0.5 - 1.9 mmol/L    Comment: Performed at [facility], [address], [place], [place] [address]  Glucose, capillary  Status: Abnormal   Collection Time: 03/01/23  9:00 PM  Result Value Ref Range   Glucose-Capillary 188 (H) 70 - 99 mg/dL    Comment: Glucose reference range applies only to samples taken after fasting for at least 8 hours.  Heparin level (unfractionated)     Status: None   Collection Time: 03/01/23 11:28 PM  Result Value Ref Range   Heparin Unfractionated 0.59 0.30 - 0.70 IU/mL    Comment: (NOTE) The clinical reportable range upper limit is being lowered to >1.10 to align with the FDA approved guidance for the current laboratory assay.  If heparin results are below expected values, and patient dosage has  been confirmed, suggest follow up testing of antithrombin III levels. Performed at [facility], [address] [place], [place] [address]   Glucose, capillary     Status: Abnormal   Collection Time: 03/01/23 11:30 PM  Result Value Ref Range   Glucose-Capillary 165 (H) 70 - 99 mg/dL    Comment: Glucose reference range applies only to samples taken after fasting for at least 8 hours.  Glucose, capillary     Status: Abnormal   Collection Time: 03/02/23  4:03 AM  Result Value Ref Range   Glucose-Capillary 162 (H) 70 - 99 mg/dL    Comment: Glucose reference range applies only to samples taken after fasting for at least 8 hours.  Glucose, capillary     Status: Abnormal   Collection Time: 03/02/23  7:39 AM  Result Value Ref Range   Glucose-Capillary 156 (H) 70 - 99 mg/dL    Comment: Glucose reference range applies only to  samples taken after fasting for at least 8 hours.   US Abdomen Limited RUQ (LIVER/GB)  Result Date: 03/02/2023 CLINICAL DATA:  Findings on CT concerning for acute cholecystitis EXAM: ULTRASOUND ABDOMEN LIMITED RIGHT UPPER QUADRANT COMPARISON:  CT abdomen and pelvis 03/01/2023 FINDINGS: Gallbladder: Mild wall thickening and pericholecystic fluid. Sludge and stones in the gallbladder including in the gallbladder neck measuring up to 1.3 cm. Common bile duct: Diameter: 5 mm.  No intrahepatic biliary dilation. Liver: No focal lesion identified. Within normal limits in parenchymal echogenicity. Portal vein is patent on color Doppler imaging with normal direction of blood flow towards the liver. Other: None. IMPRESSION: Findings compatible with acute cholecystitis. Electronically Signed   By: Minerva Fester M.D.   On: 03/02/2023 02:46   CT CHEST ABDOMEN PELVIS WO CONTRAST  Result Date: 03/01/2023 CLINICAL DATA:  Chest pain, infection. EXAM: CT CHEST, ABDOMEN AND PELVIS WITHOUT CONTRAST TECHNIQUE: Multidetector CT imaging of the chest, abdomen and pelvis was performed following the standard protocol without IV contrast. RADIATION DOSE REDUCTION: This exam was performed according to the departmental dose-optimization program which includes automated exposure control, adjustment of the mA and/or kV according to patient size and/or use of iterative reconstruction technique. COMPARISON:  CT chest abdomen and pelvis 01/21/2023 FINDINGS: CT CHEST FINDINGS Cardiovascular: Heart is mildly enlarged. Aorta is normal in size. Patient is status post cardiac surgery. There are atherosclerotic calcifications of the aorta and coronary arteries. There is no pericardial effusion. Mediastinum/Nodes: No enlarged mediastinal, hilar, or axillary lymph nodes. Thyroid gland, trachea, and esophagus demonstrate no significant findings. Lungs/Pleura: There are diffuse peripheral scattered reticular opacities throughout both lungs, slightly  increased from prior. There is no lung consolidation, pleural effusion or pneumothorax. Musculoskeletal: There are chronic left third, fourth, fifth, sixth rib fractures. There is subacute/acute nondisplaced posterolateral left eighth rib fracture. Sternotomy wires are present. Degenerative changes  affect the spine. CT ABDOMEN PELVIS FINDINGS Hepatobiliary: Gallbladder is dilated. There is diffuse gallbladder wall thickening and surrounding inflammatory stranding. Gallstones are present. There is no biliary ductal dilatation. Liver is within normal limits. Pancreas: Unremarkable. No pancreatic ductal dilatation or surrounding inflammatory changes. Spleen: There are calcified granulomas in the spleen. The spleen is otherwise within normal limits. Adrenals/Urinary Tract: There is a 4.4 cm left renal cyst. There is no hydronephrosis. Vascular calcifications are noted in both kidneys. The bladder and adrenal glands are within normal limits. Stomach/Bowel: Stomach is within normal limits. Appendix appears normal. No evidence of bowel wall thickening, distention, or inflammatory changes. There is sigmoid and descending colon diverticulosis. Vascular/Lymphatic: Aortic atherosclerosis. No enlarged abdominal or pelvic lymph nodes. Reproductive: Prostate is unremarkable. Other: There is a small fat containing right inguinal hernia. There is mild presacral edema. There is no ascites. Musculoskeletal: No acute fracture or dislocation. Degenerative changes affect the spine. There is incompletely imaged heterogeneous mixed hyper and hypodensity in the region of the adductor musculature measuring 9.0 x 8.6 cm image 3/125. IMPRESSION: 1. Findings compatible with acute cholecystitis. 2. Subacute/acute nondisplaced left eighth rib fracture. 3. Diffuse peripheral reticular opacities throughout both lungs, slightly increased from prior, worrisome for chronic interstitial lung disease. 4. Incompletely imaged heterogeneous mixed hyper  and hypodensity in the region of the right adductor musculature measuring 9.0 x 8.6 cm. Findings may represent hematoma, but other etiologies are not excluded. Correlate clinically for infection. 5. Colonic diverticulosis. Aortic Atherosclerosis (ICD10-I70.0). Electronically Signed   By: Darliss Cheney M.D.   On: 03/01/2023 19:25   ECHOCARDIOGRAM LIMITED  Result Date: 03/01/2023    ECHOCARDIOGRAM LIMITED REPORT   Patient Name:   MACEDONIO MINCE[facility] Date of Exam: 02/28/2023 Medical Rec #:  [id number]      Height:       67.0 in Accession #:    [id number]     Weight:       151.5 lb Date of Birth:  [birth year]     BSA:          1.797 m Patient Age:    76 years       BP:           113/57 mmHg Patient Gender: M              HR:           77 bpm. Exam Location:  Inpatient Procedure: Limited Echo, Cardiac Doppler, Color Doppler and Intracardiac            Opacification Agent Indications:    I51.9 Decreased LV function. Rule out LV thrombus.  History:        Patient has prior history of Echocardiogram examinations, most                 recent 02/24/2023. CAD, Prior CABG, Aortic Valve Disease,                 Signs/Symptoms:Chest Pain; Risk Factors:Hypertension, Sleep                 Apnea, Dyslipidemia and Diabetes. AVR.                 Aortic Valve: 23 mm Edwards bioprosthetic valve is present in                 the aortic position.  Sonographer:    Sheralyn Boatman RDCS Referring Phys: [phone] Camdon Saetern COOPER  Sonographer Comments: Technically difficult study due to poor echo  windows. IMPRESSIONS  1. Left ventricular ejection fraction, by estimation, is 25 to 30%. The left ventricle has severely decreased function. The left ventricle demonstrates regional wall motion abnormalities (see scoring diagram/findings for description). There is mild concentric left ventricular hypertrophy. There is global hypokinesis but more severe hypokinesis of the left ventricular, inferior wall, inferoseptal wall and apical segment.  2. The mitral valve is  degenerative. Trivial mitral valve regurgitation. Moderate mitral annular calcification.  3. The aortic valve has been repaired/replaced. There is a 23 mm Edwards bioprosthetic valve present in the aortic position.  4. No evidence of apical LV thrombus by definity contrast. FINDINGS  Left Ventricle: Left ventricular ejection fraction, by estimation, is 25 to 30%. The left ventricle has severely decreased function. The left ventricle demonstrates regional wall motion abnormalities. Definity contrast agent was given IV to delineate the left ventricular endocardial borders. There is mild concentric left ventricular hypertrophy. Mitral Valve: The mitral valve is degenerative in appearance. There is moderate thickening of the mitral valve leaflet(s). There is moderate calcification of the mitral valve leaflet(s). Moderately decreased mobility of the mitral valve leaflets. Moderate mitral annular calcification. Trivial mitral valve regurgitation. Aortic Valve: The aortic valve has been repaired/replaced. There is a 23 mm Edwards bioprosthetic valve present in the aortic position. Additional Comments: Spectral Doppler performed. Color Doppler performed.  LEFT VENTRICLE PLAX 2D LVIDd:         5.10 cm LVIDs:         4.40 cm LV PW:         1.30 cm LV IVS:        1.20 cm  LV Volumes (MOD) LV vol d, MOD A2C: 143.0 ml LV vol d, MOD A4C: 168.5 ml LV vol s, MOD A2C: 103.0 ml LV vol s, MOD A4C: 116.5 ml LV SV MOD A2C:     40.0 ml LV SV MOD A4C:     168.5 ml LV SV MOD BP:      48.7 ml IVC IVC diam: 2.60 cm Armanda Magic MD Electronically signed by Armanda Magic MD Signature Date/Time: 03/01/2023/8:15:09 AM    Final    CT HEAD WO CONTRAST ( )  Result Date: 03/01/2023 CLINICAL DATA:  Mental status change, unknown cause EXAM: CT HEAD WITHOUT CONTRAST TECHNIQUE: Contiguous axial images were obtained from the base of the skull through the vertex without intravenous contrast. RADIATION DOSE REDUCTION: This exam was performed according  to the departmental dose-optimization program which includes automated exposure control, adjustment of the mA and/or kV according to patient size and/or use of iterative reconstruction technique. COMPARISON:  None Available. FINDINGS: Brain: No evidence of acute infarction, hemorrhage, mass, mass effect, or midline shift. No hydrocephalus or extra-axial fluid collection. Vascular: No hyperdense vessel. Atherosclerotic calcifications in the intracranial carotid and vertebral arteries. Skull: Negative for fracture or focal lesion. Sinuses/Orbits: Left maxillary mucous retention cyst. Status post bilateral lens replacements. Other: The mastoid air cells are well aerated. IMPRESSION: No acute intracranial process. Electronically Signed   By: Wiliam Ke M.D.   On: 03/01/2023 03:32    Anti-infectives (From admission, onward)    Start     Dose/Rate Route Frequency Ordered Stop   03/02/23 1800  vancomycin (VANCOREADY) IVPB 1250 mg/250 mL  Status:  Discontinued        1,250 mg 166.7 mL/hr over 90 Minutes Intravenous Every 48 hours 02/28/23 1643 03/02/23 0934   03/02/23 1400  Ampicillin-Sulbactam (UNASYN) 3 g in sodium chloride 0.9 % 100 mL IVPB  3 g 200 mL/hr over 30 Minutes Intravenous Every 8 hours 03/02/23 0934     03/01/23 1800  ceFEPIme (MAXIPIME) 2 g in sodium chloride 0.9 % 100 mL IVPB  Status:  Discontinued        2 g 200 mL/hr over 30 Minutes Intravenous Every 24 hours 02/28/23 1643 03/02/23 0934   02/28/23 1715  ceFEPIme (MAXIPIME) 2 g in sodium chloride 0.9 % 100 mL IVPB        2 g 200 mL/hr over 30 Minutes Intravenous  Once 02/28/23 1622 02/28/23 1728   02/28/23 1715  vancomycin (VANCOREADY) IVPB 1500 mg/300 mL        1,500 mg 150 mL/hr over 120 Minutes Intravenous  Once 02/28/23 1622 02/28/23 2035   02/28/23 1700  metroNIDAZOLE (FLAGYL) IVPB 500 mg  Status:  Discontinued        500 mg 100 mL/hr over 60 Minutes Intravenous Every 12 hours 02/28/23 1622 03/02/23 0934   02/26/23 1100   piperacillin-tazobactam (ZOSYN) IVPB 3.375 g  Status:  Discontinued        3.375 g 12.5 mL/hr over 240 Minutes Intravenous Every 8 hours 02/26/23 1008 02/28/23 1622       Assessment/Plan Possible Acute Cholecystitis  This is a 77 year old male who initially presented with chest pain and was found to have NSTEMI.  His hospital stay has been complicated by exacerbation of his CHF and new onset A-fib.  He is also had hospital delirium and leukocytosis.  He is currently getting treated with Unasyn.  His imaging is suggestive of possible cholecystitis, however patient has currently completely nontender on exam.  His last dose of prn pain medication was at 0039 this morning (Tylenol).  It is possible his GB wall thickening and pericholecystic fluid noted on imaging could be 2/2 to hepatic congestion from his CHF.  Given he is currently NT on exam and family reports of him pulling at leads/lines at night I would like to ensure he truly has Cholecystis before ordering Perc Chole. Will obtain HIDA.  If HIDA scan is positive I would not recommend cholecystectomy as I believe he would be high risk for general anesthesia and rather would recommend cholecystostomy tube placement by IR.  Discussed plan with [facility] and IR who agree's with plan. To note, he does have a 1.3cm stone in GB neck. If he was to develop symptoms, low threshold to proceed with perc chole.  I discussed the plan with my attending as well who agrees.  Will follow-up on HIDA. Cont abx.   FEN - NPO for HIDA. IVF per TRH VTE - SCDs, on hold for anemai ID - Unasyn per ID  I reviewed nursing notes, Consultant (ID) notes, hospitalist notes, last 24 h vitals and pain scores, last 48 h intake and output, last 24 h labs and trends, and last 24 h imaging results.   Jacinto Halim, [facility] 03/02/2023, 10:01 AM Please see Amion for pager number during day hours 7:00am-4:30pm

## 2023-03-02 NOTE — Plan of Care (Signed)
  Problem: Clinical Measurements: Goal: Will remain free from infection Outcome: Progressing Goal: Respiratory complications will improve Outcome: Progressing Goal: Cardiovascular complication will be avoided Outcome: Progressing   Problem: Activity: Goal: Risk for activity intolerance will decrease Outcome: Progressing   Problem: Coping: Goal: Level of anxiety will decrease Outcome: Progressing   Problem: Elimination: Goal: Will not experience complications related to bowel motility Outcome: Progressing Goal: Will not experience complications related to urinary retention Outcome: Progressing   Problem: Pain Managment: Goal: General experience of comfort will improve Outcome: Progressing   Problem: Safety: Goal: Ability to remain free from injury will improve Outcome: Progressing   Problem: Skin Integrity: Goal: Risk for impaired skin integrity will decrease Outcome: Progressing   Problem: Education: Goal: Knowledge of General Education information will improve Description: Including pain rating scale, medication(s)/side effects and non-pharmacologic comfort measures Outcome: Not Progressing   Problem: Health Behavior/Discharge Planning: Goal: Ability to manage health-related needs will improve Outcome: Not Progressing   Problem: Clinical Measurements: Goal: Ability to maintain clinical measurements within normal limits will improve Outcome: Not Progressing Goal: Diagnostic test results will improve Outcome: Not Progressing   Problem: Nutrition: Goal: Adequate nutrition will be maintained Outcome: Not Progressing

## 2023-03-02 NOTE — Assessment & Plan Note (Addendum)
Hgb stable at 10 g/dL for first week.  Starting around 8/20, Hgb started to drop.  On CT abdomen/pelvis 8/21, groin hematoma noted.  No compartment syndrome.    Transfused 2 units 8/22.  Hgb up appropriately today - Hold heparin - Hgb threshold 8 g/dL given CHF, NSTEMI - Trend Hgb

## 2023-03-03 DIAGNOSIS — I5043 Acute on chronic combined systolic (congestive) and diastolic (congestive) heart failure: Secondary | ICD-10-CM | POA: Diagnosis not present

## 2023-03-03 DIAGNOSIS — R079 Chest pain, unspecified: Secondary | ICD-10-CM | POA: Diagnosis not present

## 2023-03-03 DIAGNOSIS — N1831 Chronic kidney disease, stage 3a: Secondary | ICD-10-CM | POA: Diagnosis not present

## 2023-03-03 DIAGNOSIS — S301XXA Contusion of abdominal wall, initial encounter: Secondary | ICD-10-CM | POA: Insufficient documentation

## 2023-03-03 DIAGNOSIS — N179 Acute kidney failure, unspecified: Secondary | ICD-10-CM | POA: Diagnosis not present

## 2023-03-03 DIAGNOSIS — I214 Non-ST elevation (NSTEMI) myocardial infarction: Secondary | ICD-10-CM | POA: Diagnosis not present

## 2023-03-03 LAB — PROTIME-INR
INR: 1.3 — ABNORMAL HIGH (ref 0.8–1.2)
Prothrombin Time: 16.1 seconds — ABNORMAL HIGH (ref 11.4–15.2)

## 2023-03-03 LAB — CBC
HCT: 23.2 % — ABNORMAL LOW (ref 39.0–52.0)
Hemoglobin: 8.2 g/dL — ABNORMAL LOW (ref 13.0–17.0)
MCH: 29.7 pg (ref 26.0–34.0)
MCHC: 35.3 g/dL (ref 30.0–36.0)
MCV: 84.1 fL (ref 80.0–100.0)
Platelets: 173 10*3/uL (ref 150–400)
RBC: 2.76 MIL/uL — ABNORMAL LOW (ref 4.22–5.81)
RDW: 17.9 % — ABNORMAL HIGH (ref 11.5–15.5)
WBC: 25.7 10*3/uL — ABNORMAL HIGH (ref 4.0–10.5)
nRBC: 0.2 % (ref 0.0–0.2)

## 2023-03-03 LAB — HEPATIC FUNCTION PANEL
ALT: 47 U/L — ABNORMAL HIGH (ref 0–44)
AST: 69 U/L — ABNORMAL HIGH (ref 15–41)
Albumin: 2 g/dL — ABNORMAL LOW (ref 3.5–5.0)
Alkaline Phosphatase: 61 U/L (ref 38–126)
Bilirubin, Direct: 0.5 mg/dL — ABNORMAL HIGH (ref 0.0–0.2)
Indirect Bilirubin: 0.9 mg/dL (ref 0.3–0.9)
Total Bilirubin: 1.4 mg/dL — ABNORMAL HIGH (ref 0.3–1.2)
Total Protein: 5.2 g/dL — ABNORMAL LOW (ref 6.5–8.1)

## 2023-03-03 LAB — BASIC METABOLIC PANEL
Anion gap: 14 (ref 5–15)
BUN: 64 mg/dL — ABNORMAL HIGH (ref 8–23)
CO2: 18 mmol/L — ABNORMAL LOW (ref 22–32)
Calcium: 8.3 mg/dL — ABNORMAL LOW (ref 8.9–10.3)
Chloride: 108 mmol/L (ref 98–111)
Creatinine, Ser: 2.49 mg/dL — ABNORMAL HIGH (ref 0.61–1.24)
GFR, Estimated: 26 mL/min — ABNORMAL LOW (ref >60)
Glucose, Bld: 159 mg/dL — ABNORMAL HIGH (ref 70–99)
Potassium: 3.7 mmol/L (ref 3.5–5.1)
Sodium: 140 mmol/L (ref 135–145)

## 2023-03-03 LAB — GLUCOSE, CAPILLARY
Glucose-Capillary: 144 mg/dL — ABNORMAL HIGH (ref 70–99)
Glucose-Capillary: 142 mg/dL — ABNORMAL HIGH (ref 70–99)
Glucose-Capillary: 254 mg/dL — ABNORMAL HIGH (ref 70–99)
Glucose-Capillary: 205 mg/dL — ABNORMAL HIGH (ref 70–99)
Glucose-Capillary: 263 mg/dL — ABNORMAL HIGH (ref 70–99)
Glucose-Capillary: 135 mg/dL — ABNORMAL HIGH (ref 70–99)

## 2023-03-03 MED ORDER — SODIUM CHLORIDE 0.9 % IV BOLUS
500.0000 mL | Freq: Once | INTRAVENOUS | Status: AC
  Administered 2023-03-03: 500 mL via INTRAVENOUS

## 2023-03-03 MED ORDER — PIPERACILLIN-TAZOBACTAM 3.375 G IVPB
3.3750 g | Freq: Three times a day (TID) | INTRAVENOUS | Status: DC
  Administered 2023-03-03 – 2023-03-04 (×3): 3.375 g via INTRAVENOUS
  Filled 2023-03-03 (×5): qty 50

## 2023-03-03 MED ORDER — HEPARIN (PORCINE) 25000 UT/250ML-% IV SOLN
1050.0000 [IU]/h | INTRAVENOUS | Status: DC
  Administered 2023-03-03: 950 [IU]/h via INTRAVENOUS
  Filled 2023-03-03 (×2): qty 250

## 2023-03-03 MED ORDER — QUETIAPINE FUMARATE 25 MG PO TABS
25.0000 mg | ORAL_TABLET | Freq: Every day | ORAL | Status: DC
  Administered 2023-03-03 – 2023-03-10 (×8): 25 mg via ORAL
  Filled 2023-03-03 (×8): qty 1

## 2023-03-03 MED ORDER — HALOPERIDOL LACTATE 5 MG/ML IJ SOLN
1.0000 mg | Freq: Four times a day (QID) | INTRAMUSCULAR | Status: DC | PRN
  Administered 2023-03-03 – 2023-03-04 (×2): 1 mg via INTRAVENOUS
  Filled 2023-03-03 (×2): qty 1

## 2023-03-03 MED ORDER — SODIUM CHLORIDE 0.9 % IV SOLN: 3.0000 g | Freq: Two times a day (BID) | INTRAVENOUS | Status: DC

## 2023-03-03 NOTE — Progress Notes (Signed)
Progress Note   Patient: Nathan [name] NFA:[id number] DOB: [birth year] DOA: 02/20/2023     10 DOS: the patient was seen and examined on 03/03/2023 at 8:55 AM      Brief hospital course: Mr. Bettin is a 77 y.o. M with DM, HTN, CAD s/p CABG and AVR, hx OSA, chronic nightmares, and history severe necrotizing fasciitis some years ago who presented with chest discomfort, admitted to Cardiology for NSTEMI.    Subsequent hospitalization complicated by respiratory failure requiring BiPAP, later delirium.    Hospitalist service consulted for persistent delirium and leukocytosis.   Significant events: 8/13: Admitted on heparin to Cardiology for NSTEMI --> evening of HD1, developed respiratory distress and rales requiring BiPAP and ICU transfer 8/14: Developed delirium requiring Precedex; Transaminitis noted 8/15: Cr peaks at 2.15 8/16: Weaned off Precedex; new onset Atrial fibrillation noted 8/17: Work up for leukocytosis started 8/18: Antibiotics started for Pneumonia based on CXR 8/20: Hgb down to 8s, Hospitalists consulted for persistent delirium, leukocytosis 8/21: ID consulted; CT C/A/P shows abnormal gallbladder, new hematoma; heparin stopped 8/22: IR and Gen Surg consulted; HIDA confirms cholecystitis; transfused 2 units; Perc chole drain placed 8/23: Worsening agitation/delirium   Significant studies: 8/14 echo: EF 20-25% 8/20 echo: EF 25-30% with RWMA, no thrombus; AV repair noted 8/20 CT head: unremarkable 8/21 CT C/A/P: hematoma in groin and cholecystitis 8/21 US abdomen: cholecystitis and gallstone 8/22 HIDA: abnormal   Significant microbiology data: 8/17 blood cx x2: NG 8/19 urine cx: NG 8/20 blood cx x2: NG 8/20 RVP: neg 8/20 COVID: neg 8/22: Perc drain   Procedures: 8/22: Percutaneous cholecystostomy tube    Consults: Cardiology General Surgery ID IR         Assessment and Plan: * NSTEMI (non-ST elevated myocardial infarction) (HCC) Completed  several days of heparin, which was held 8/21 due to hematoma and worsening anemia.  Defer timing of angiography to Cardiology - Hold aspirin given bleeding - Continue Lipitor, metoprolol - Hold Imdur given hypotension     Acute on chronic combined systolic and diastolic CHF (congestive heart failure) (HCC) See prior notes.  Admitted with NSTEMI, found to have EF 25-30%.  On hospital day 1 developed dyspnea, rales, bilateral infiltrates and required BiPAP.    Treated with diuretics and appears euvolemic off diuretics at this point. - Continue metoprolol - Defer diuretics and other GDMT to Cardiology    Acute posthemorrhagic anemia Hgb stable at 10 g/dL for first week.  Starting around 8/20, Hgb started to drop.  On CT abdomen/pelvis 8/21, groin hematoma noted.  No compartment syndrome.    Transfused 2 units 8/22.  Hgb up appropriately today - Hold heparin - Hgb threshold 8 g/dL given CHF, NSTEMI - Trend Hgb  Acute metabolic encephalopathy See longer summary from 8/21. This is multifactorial delirium from NSTEMI, cardiomyopathy and cholecystitis.  Some agitation today with nursing. - Start seroquel 25 nightly - PRN Haldol for agitation - Treat cholecystitis - Standard delirium precautions: blinds open and lights on during day, TV off, minimize interruptions at night, glasses/hearing aids, PT/OT, avoiding Beers list medications    Acute cholecystitis Not noted on admission.  Transaminitis noted early in hospitalization, interpreted as congestive.  Imaging 8/21 showed signs of gallstones and cholecystitis, confirmed with HIDA.  Perc tube placed 8/22 - Cotinue Zosyn  - Plan for 3 weeks antibiotics - Plan for 6 weeks drain, follow up in IR clinic - Drain flushes per IR - Consult ID and IR  Paroxysmal atrial fibrillation (HCC) New onset.  CHA2DS2-Vasc 6 (age, HTN, CHF, DM, vascular disease).  Rate controlled at present - Continue metoprolol - Hold anticoagulation - If  Hgb trending up in next 24-48 hours, will cautiously resume heparin gtt prior to starting NOAC at discharge  Acute kidney injury on CKD stage 3a, GFR 45-59 ml/min (HCC) Cr up again today to 2.5 in setting of anemia, hypotension post-procedure yesterday. - Daily BMP - IV fluids cautiously  - Avoid nephrotoxins - Hold Imdur, avoid hypotension  Groin hematoma See above re: anemia - Hold anticoagulation for now  Hypokalemia Supplemented and resolved  CKD stage 3a, GFR 45-59 ml/min (HCC) Baseline 1.3-1.5.    Acute respiratory failure with hypoxia (HCC) On hospital day 1, in setting of CHF.  Resolved  S/P AVR    Dyslipidemia - Continue Lipitor          Subjective: Patient has no complaints.  Nursing reports he was having nightmares overnight, 10 during the day today after his wife left, was agitated and uncooperative.  No fever, respiratory symptoms, swelling.     Physical Exam: BP 113/64 (BP Location: Left Arm)   Pulse 61   Temp 98.3 F (36.8 C) (Oral)   Resp 18   Ht 5\' 7"  (1.702 m)   Wt 69.3 kg   SpO2 97%   BMI 23.93 kg/m   Pale elderly adult male, lying in bed, appears listless and weak Tachycardic, irregular, no murmurs, no peripheral edema Respiratory normal, lungs clear without rales or wheezes Abdomen soft nontender palpation or guarding, no ascites or distention Attention diminished, affect blunted, judgment and insight appear impaired  Data Reviewed: Patient metabolic panel and CBC reviewed, case discussed with general surgery   Family Communication: Wife at the bedside    Disposition: Status is: Inpatient The patient presented for chest pain, found to have NSTEMI.  He was admitted to cardiology, subsequently suffered episode of congestive heart failure, then developed leukocytosis and delirium, and on further workup was found to have coincident cholecystitis  He has now had percutaneous cholecystostomy tube and CHF is stabilized  However,  his course has also been complicated by acute blood loss anemia into a groin hematoma  We will trend his blood level for the next few days, if stable we can resume heparin, then a DOAC, and if stable on DOAC he should be able to discharge to rehab.  All of this course will be complicated by ongoing delirium        Author: Alberteen Sam, MD 03/03/2023 5:04 PM  For on call review [email or web address].

## 2023-03-03 NOTE — Assessment & Plan Note (Signed)
See above re: anemia - Hold anticoagulation for now

## 2023-03-03 NOTE — Progress Notes (Signed)
[facility]  Date of Admission:  02/20/2023   Total days of inpatient antibiotics 6  Principal Problem:   NSTEMI (non-ST elevated myocardial infarction) (HCC) Active Problems:   Dyslipidemia   S/P AVR   Acute on chronic combined systolic and diastolic CHF (congestive heart failure) (HCC)   Acute respiratory failure with hypoxia (HCC)   Acute metabolic encephalopathy   Acute kidney injury on CKD stage 3a, GFR 45-59 ml/min (HCC)   CKD stage 3a, GFR 45-59 ml/min (HCC)   Acute cholecystitis   Paroxysmal atrial fibrillation (HCC)   Acute posthemorrhagic anemia   Hypokalemia          Assessment: 77 year old male with history of hallucinations, diabetes A1c 6.1, admitted with NSTEMI found to have new onset A-fib.  He had hallucinations or hospitalization message ID engaged for possible infectious etiology.  #Leukocytosis 2/2 cholecystitis - CT abdomen pelvis showed signs of cholecystitis.  There was mild elevation in transaminases.  General surgery was engaged, recommended HIDA scan.  He had noted that even if HIDA scan is positive, would not recommend cholecystostomy due to high risk for general anesthesia. - HIDA scan showed acute cholecystitis.  Right general surgery recommended cholecystostomy tube placed by IR on 8/22  Recommendations: -GNR shown on Gram stain, will escalate back to pip-tazo pending sensitivities - Follow aspirate cultures, hopefully have p.o. options for intra-abdominal infection.  Plan on at least 3 weeks of antibiotics, final duration pending repeat imaging. -Follow-up with infectious disease, myself on 9/9.  #Intermittent confusion with history of hallucinations. -Patient has a longstanding history of hallucinations.  Do not think it is infectious in etiology    Dr. Algis Liming will be covering this weekend, new ID team next week. Microbiology:   Antibiotics: Pip-tazo 8/18-20 Cefepime and metronidazole 02/28/2020 Unasyn  8/22 Cultures: Blood 8/20 no growth Urine  Other Drain culture 8/22 Gram stain showed GNR, cultures reintubating SUBJECTIVE: Resting in bed.  Wife at bedside. Interval: WBC 20 5.7K, afebrile overnight.  Review of Systems: Review of Systems  All other systems reviewed and are negative.    Scheduled Meds:  allopurinol  300 mg Oral Daily   vitamin C  1,000 mg Oral Daily   atorvastatin  80 mg Oral Daily   docusate sodium  100 mg Oral BID   insulin aspart  0-9 Units Subcutaneous Q4H   melatonin  5 mg Oral QHS   metoprolol tartrate  25 mg Oral BID   polyethylene glycol  17 g Oral Daily   sodium chloride flush  5 mL Intracatheter Q8H   Continuous Infusions:  sodium chloride Stopped (02/24/23 0824)   cefOXitin Stopped (03/02/23 1900)   piperacillin-tazobactam (ZOSYN)  IV     PRN Meds:.sodium chloride, acetaminophen, cefOXitin, midazolam, nitroGLYCERIN, mouth rinse, oxyCODONE-acetaminophen, senna-docusate Allergies  Allergen Reactions   Shellfish-Derived Products Anaphylaxis   Zolpidem Tartrate Other (See Comments)    Hallucinations    OBJECTIVE: Vitals:   03/02/23 2359 03/03/23 0045 03/03/23 0419 03/03/23 0842  BP: 94/60 112/76 110/61 101/70  Pulse: 72 92 89 91  Resp: 16 18 20 20   Temp: 99 F (37.2 C) 97.9 F (36.6 C) 98.8 F (37.1 C)   TempSrc: Oral Oral Axillary Oral  SpO2: 100% 100% 100% 98%  Weight:   69.3 kg   Height:       Body mass index is 23.93 kg/m.  Physical Exam Constitutional:      General: He is not in acute  distress.    Appearance: He is normal weight. He is not toxic-appearing.  HENT:     Head: Normocephalic and atraumatic.     Right Ear: External ear normal.     Left Ear: External ear normal.     Nose: No congestion or rhinorrhea.     Mouth/Throat:     Mouth: Mucous membranes are moist.     Pharynx: Oropharynx is clear.  Eyes:     Extraocular Movements: Extraocular movements intact.     Conjunctiva/sclera: Conjunctivae normal.      Pupils: Pupils are equal, round, and reactive to light.  Cardiovascular:     Rate and Rhythm: Normal rate and regular rhythm.     Heart sounds: No murmur heard.    No friction rub. No gallop.  Pulmonary:     Effort: Pulmonary effort is normal.     Breath sounds: Normal breath sounds.  Abdominal:     General: Abdomen is flat. Bowel sounds are normal.     Palpations: Abdomen is soft.  Musculoskeletal:        General: No swelling. Normal range of motion.     Cervical back: Normal range of motion and neck supple.     Comments: RUQ drain  Skin:    General: Skin is warm and dry.  Neurological:     General: No focal deficit present.  Psychiatric:        Mood and Affect: Mood normal.       Lab Results Lab Results  Component Value Date   WBC 25.7 (H) 03/03/2023   HGB 8.2 (L) 03/03/2023   HCT 23.2 (L) 03/03/2023   MCV 84.1 03/03/2023   PLT 173 03/03/2023    Lab Results  Component Value Date   CREATININE 2.49 (H) 03/03/2023   BUN 64 (H) 03/03/2023   NA 140 03/03/2023   K 3.7 03/03/2023   CL 108 03/03/2023   CO2 18 (L) 03/03/2023    Lab Results  Component Value Date   ALT 47 (H) 03/03/2023   AST 69 (H) 03/03/2023   ALKPHOS 61 03/03/2023   BILITOT 1.4 (H) 03/03/2023        Danelle Earthly, MD [facility] 03/03/2023, 11:37 AM I have personally spent 52 minutes involved in face-to-face and non-face-to-face activities for this patient on the day of the visit. Professional time spent includes the following activities: Preparing to see the patient (review of tests), Obtaining and/or reviewing separately obtained history (admission/discharge record), Performing a medically appropriate examination and/or evaluation , Ordering medications/tests/procedures, referring and communicating with other health care professionals, Documenting clinical information in the EMR, Independently interpreting results (not separately reported),  Communicating results to the patient/family/caregiver, Counseling and educating the patient/family/caregiver and Care coordination (not separately reported).

## 2023-03-03 NOTE — Progress Notes (Signed)
Subjective: CC: Patients wife at bedside.  Patient s/p perc chole drain placement by IR yesterday, 8/22 He reports no abdominal pain this morning. Patient's wife reports he ate the best that he has recently. No n/v.   Objective: Vital signs in last 24 hours: Temp:  [97.4 F (36.3 C)-99 F (37.2 C)] 98.8 F (37.1 C) (08/23 0419) Pulse Rate:  [71-151] 91 (08/23 0842) Resp:  [16-20] 20 (08/23 0842) BP: (75-112)/(46-88) 101/70 (08/23 0842) SpO2:  [95 %-100 %] 98 % (08/23 0842) Weight:  [69.3 kg] 69.3 kg (08/23 0419) Last BM Date : 03/02/23  Intake/Output from previous day: 08/22 0701 - 08/23 0700 In: 918.2 [I.V.:0.5; Blood:712; IV Piggyback:200.7] Out: 575 [Urine:250; Drains:325] Intake/Output this shift: No intake/output data recorded.  PE: Gen:  Alert, NAD, pleasant Abd: Soft, ND, NT, IR perc chole drain bilious   Lab Results:  Recent Labs    03/02/23 0942 03/03/23 0418  WBC 20.5* 25.7*  HGB 6.2* 8.2*  HCT 17.9* 23.2*  PLT 226 173   BMET Recent Labs    03/02/23 0942 03/03/23 0418  NA 139 140  K 3.7 3.7  CL 106 108  CO2 19* 18*  GLUCOSE 179* 159*  BUN 54* 64*  CREATININE 2.01* 2.49*  CALCIUM 8.3* 8.3*   PT/INR Recent Labs    03/03/23 0418  LABPROT 16.1*  INR 1.3*   CMP     Component Value Date/Time   NA 140 03/03/2023 0418   NA 140 07/20/2021 0846   K 3.7 03/03/2023 0418   CL 108 03/03/2023 0418   CO2 18 (L) 03/03/2023 0418   GLUCOSE 159 (H) 03/03/2023 0418   BUN 64 (H) 03/03/2023 0418   BUN 18 07/20/2021 0846   CREATININE 2.49 (H) 03/03/2023 0418   CREATININE 1.14 02/25/2016 0858   CALCIUM 8.3 (L) 03/03/2023 0418   PROT 5.2 (L) 03/03/2023 0418   PROT 6.5 07/20/2021 0846   ALBUMIN 2.0 (L) 03/03/2023 0418   ALBUMIN 4.5 07/20/2021 0846   AST 69 (H) 03/03/2023 0418   ALT 47 (H) 03/03/2023 0418   ALKPHOS 61 03/03/2023 0418   BILITOT 1.4 (H) 03/03/2023 0418   BILITOT 0.5 07/20/2021 0846   GFRNONAA 26 (L) 03/03/2023 0418   GFRAA 59  (L) 04/04/2018 1604   Lipase  No results found for: "LIPASE"  Studies/Results: CT CHEST ABDOMEN PELVIS WO CONTRAST  Addendum Date: 03/02/2023   ADDENDUM REPORT: 03/02/2023 17:46 ADDENDUM: These results were called by telephone at the time of interpretation on 03/01/2023 at 5:55 pm to provider Dr. Antionette Char, who verbally acknowledged these results. Electronically Signed   By: Darliss Cheney M.D.   On: 03/02/2023 17:46   Result Date: 03/02/2023 CLINICAL DATA:  Chest pain, infection. EXAM: CT CHEST, ABDOMEN AND PELVIS WITHOUT CONTRAST TECHNIQUE: Multidetector CT imaging of the chest, abdomen and pelvis was performed following the standard protocol without IV contrast. RADIATION DOSE REDUCTION: This exam was performed according to the departmental dose-optimization program which includes automated exposure control, adjustment of the mA and/or kV according to patient size and/or use of iterative reconstruction technique. COMPARISON:  CT chest abdomen and pelvis 01/21/2023 FINDINGS: CT CHEST FINDINGS Cardiovascular: Heart is mildly enlarged. Aorta is normal in size. Patient is status post cardiac surgery. There are atherosclerotic calcifications of the aorta and coronary arteries. There is no pericardial effusion. Mediastinum/Nodes: No enlarged mediastinal, hilar, or axillary lymph nodes. Thyroid gland, trachea, and esophagus demonstrate no significant findings. Lungs/Pleura: There are diffuse peripheral scattered  reticular opacities throughout both lungs, slightly increased from prior. There is no lung consolidation, pleural effusion or pneumothorax. Musculoskeletal: There are chronic left third, fourth, fifth, sixth rib fractures. There is subacute/acute nondisplaced posterolateral left eighth rib fracture. Sternotomy wires are present. Degenerative changes affect the spine. CT ABDOMEN PELVIS FINDINGS Hepatobiliary: Gallbladder is dilated. There is diffuse gallbladder wall thickening and surrounding inflammatory  stranding. Gallstones are present. There is no biliary ductal dilatation. Liver is within normal limits. Pancreas: Unremarkable. No pancreatic ductal dilatation or surrounding inflammatory changes. Spleen: There are calcified granulomas in the spleen. The spleen is otherwise within normal limits. Adrenals/Urinary Tract: There is a 4.4 cm left renal cyst. There is no hydronephrosis. Vascular calcifications are noted in both kidneys. The bladder and adrenal glands are within normal limits. Stomach/Bowel: Stomach is within normal limits. Appendix appears normal. No evidence of bowel wall thickening, distention, or inflammatory changes. There is sigmoid and descending colon diverticulosis. Vascular/Lymphatic: Aortic atherosclerosis. No enlarged abdominal or pelvic lymph nodes. Reproductive: Prostate is unremarkable. Other: There is a small fat containing right inguinal hernia. There is mild presacral edema. There is no ascites. Musculoskeletal: No acute fracture or dislocation. Degenerative changes affect the spine. There is incompletely imaged heterogeneous mixed hyper and hypodensity in the region of the adductor musculature measuring 9.0 x 8.6 cm image 3/125. IMPRESSION: 1. Findings compatible with acute cholecystitis. 2. Subacute/acute nondisplaced left eighth rib fracture. 3. Diffuse peripheral reticular opacities throughout both lungs, slightly increased from prior, worrisome for chronic interstitial lung disease. 4. Incompletely imaged heterogeneous mixed hyper and hypodensity in the region of the right adductor musculature measuring 9.0 x 8.6 cm. Findings may represent hematoma, but other etiologies are not excluded. Correlate clinically for infection. 5. Colonic diverticulosis. Aortic Atherosclerosis (ICD10-I70.0). Electronically Signed: By: Darliss Cheney M.D. On: 03/01/2023 19:25   IR Perc Cholecystostomy  Result Date: 03/02/2023 INDICATION: 77 year old male with calculus cholecystitis referred for  percutaneous cholecystostomy EXAM: CHOLECYSTOSTOMY MEDICATIONS: None ANESTHESIA/SEDATION: Moderate (conscious) sedation was not employed during this procedure. A total of Versed 1.0 mg and Fentanyl 0 mcg was administered intravenously. Moderate Sedation Time: 0 minutes. The patient's level of consciousness and vital signs were monitored continuously by radiology nursing throughout the procedure under my direct supervision. FLUOROSCOPY TIME:  Fluoroscopy Time:  (3 mGy). COMPLICATIONS: None PROCEDURE: Informed written consent was obtained from the patient and the patient's family after a thorough discussion of the procedural risks, benefits and alternatives. All questions were addressed. Maximal Sterile Barrier Technique was utilized including caps, mask, sterile gowns, sterile gloves, sterile drape, hand hygiene and skin antiseptic. A timeout was performed prior to the initiation of the procedure. Ultrasound survey of the right upper quadrant was performed for planning purposes. Once the patient is prepped and draped in the usual sterile fashion, the skin and subcutaneous tissues overlying the gallbladder were generously infiltrated 1% lidocaine for local anesthesia. A coaxial needle was advanced under ultrasound guidance through the skin subcutaneous tissues and a small segment of liver into the gallbladder lumen. With removal of the stylet, spontaneous dark bile drainage occurred. Using modified Seldinger technique, a 10 French drain was placed into the gallbladder fossa, with aspiration of the sample for the lab. Contrast injection confirmed position of the tube within the gallbladder lumen. Drainage catheter was attached to gravity drain with a suture retention placed. Patient tolerated the procedure well and remained hemodynamically stable throughout. No complications were encountered and no significant blood loss encountered. IMPRESSION: Status post image guided percutaneous cholecystostomy. Signed, Marijean Niemann  Kenna Gilbert, DO, ABVM, RPVI [facility] Radiology Electronically Signed   By: Gilmer Mor D.O.   On: 03/02/2023 17:21   NM Hepatobiliary Liver Func  Result Date: 03/02/2023 CLINICAL DATA:  Concern for cholecystitis. RIGHT upper quadrant pain EXAM: NUCLEAR MEDICINE HEPATOBILIARY IMAGING TECHNIQUE: Sequential images of the abdomen were obtained out to 60 minutes following intravenous administration of radiopharmaceutical. RADIOPHARMACEUTICALS:  5.2 mCi Tc-43m  Choletec IV COMPARISON:  CT 03/01/2023 FINDINGS: Prompt clearance radiotracer from blood pool and homogeneous uptake in liver. Counts are evident in the small bowel by 15 minutes. Gallbladder fails to fill at 60 minutes. 2.8 mg IV morphine was administered to augment filling of the gallbladder. Gallbladder fails to fill following morphine augmentation. IMPRESSION: 1. Non filling of the gallbladder consistent with cystic duct obstruction/ACUTE CHOLECYSTITIS. 2. Patent common bile duct. These results will be called to the ordering clinician or representative by the Radiologist Assistant, and communication documented in the PACS or [employer]. Electronically Signed   By: Genevive Bi M.D.   On: 03/02/2023 15:56   US Abdomen Limited RUQ (LIVER/GB)  Result Date: 03/02/2023 CLINICAL DATA:  Findings on CT concerning for acute cholecystitis EXAM: ULTRASOUND ABDOMEN LIMITED RIGHT UPPER QUADRANT COMPARISON:  CT abdomen and pelvis 03/01/2023 FINDINGS: Gallbladder: Mild wall thickening and pericholecystic fluid. Sludge and stones in the gallbladder including in the gallbladder neck measuring up to 1.3 cm. Common bile duct: Diameter: 5 mm.  No intrahepatic biliary dilation. Liver: No focal lesion identified. Within normal limits in parenchymal echogenicity. Portal vein is patent on color Doppler imaging with normal direction of blood flow towards the liver. Other: None. IMPRESSION: Findings compatible with acute  cholecystitis. Electronically Signed   By: Minerva Fester M.D.   On: 03/02/2023 02:46    Anti-infectives: Anti-infectives (From admission, onward)    Start     Dose/Rate Route Frequency Ordered Stop   03/03/23 1800  Ampicillin-Sulbactam (UNASYN) 3 g in sodium chloride 0.9 % 100 mL IVPB  Status:  Discontinued        3 g 200 mL/hr over 30 Minutes Intravenous Every 12 hours 03/03/23 0734 03/03/23 0948   03/03/23 1200  piperacillin-tazobactam (ZOSYN) IVPB 3.375 g        3.375 g 12.5 mL/hr over 240 Minutes Intravenous Every 8 hours 03/03/23 0948     03/02/23 1800  vancomycin (VANCOREADY) IVPB 1250 mg/250 mL  Status:  Discontinued        1,250 mg 166.7 mL/hr over 90 Minutes Intravenous Every 48 hours 02/28/23 1643 03/02/23 0934   03/02/23 1642  cefOXitin (MEFOXIN) 2 g in sodium chloride 0.9 % 100 mL IVPB        over 30 Minutes  Continuous PRN 03/02/23 1642     03/02/23 1400  Ampicillin-Sulbactam (UNASYN) 3 g in sodium chloride 0.9 % 100 mL IVPB  Status:  Discontinued        3 g 200 mL/hr over 30 Minutes Intravenous Every 8 hours 03/02/23 0934 03/03/23 0734   03/01/23 1800  ceFEPIme (MAXIPIME) 2 g in sodium chloride 0.9 % 100 mL IVPB  Status:  Discontinued        2 g 200 mL/hr over 30 Minutes Intravenous Every 24 hours 02/28/23 1643 03/02/23 0934   02/28/23 1715  ceFEPIme (MAXIPIME) 2 g in sodium chloride 0.9 % 100 mL IVPB        2 g 200 mL/hr over 30 Minutes Intravenous  Once 02/28/23 1622 02/28/23 1728   02/28/23  1715  vancomycin (VANCOREADY) IVPB 1500 mg/300 mL        1,500 mg 150 mL/hr over 120 Minutes Intravenous  Once 02/28/23 1622 02/28/23 2035   02/28/23 1700  metroNIDAZOLE (FLAGYL) IVPB 500 mg  Status:  Discontinued        500 mg 100 mL/hr over 60 Minutes Intravenous Every 12 hours 02/28/23 1622 03/02/23 0934   02/26/23 1100  piperacillin-tazobactam (ZOSYN) IVPB 3.375 g  Status:  Discontinued        3.375 g 12.5 mL/hr over 240 Minutes Intravenous Every 8 hours 02/26/23 1008  02/28/23 1622        Assessment/Plan Acute Cholecystitis  - S/p Perc Chole drain placement by IR on 8/22. Drain and drain flushes per their team - LFT's downtrending. - Cx pending. Cont abx. Defer selection and duration to ID as they are following.  - Patient symptomatically improved. No abdominal pain and tolerating diet without n/v. NT on exam. We will sign off. Will arrange f/u with Dr. Derrell Lolling in ~6 weeks to discuss elective Cholecystectomy. He should have appointment with IR prior to this appointment for drain Cholangiogram. Would also be helpful to have cardiac evaluation for general anesthesia/operative risk stratification prior to his appointment with Dr. Derrell Lolling.  - Please call back with any questions or concerns.   I reviewed nursing notes, Consultant (IR) notes, hospitalist notes, last 24 h vitals and pain scores, last 48 h intake and output, last 24 h labs and trends, and last 24 h imaging results.   LOS: 10 days    Jacinto Halim , [facility] Surgery 03/03/2023, 10:34 AM Please see Amion for pager number during day hours 7:00am-4:30pm

## 2023-03-03 NOTE — Progress Notes (Signed)
Referring Physician(s): Nathan Mediate, MD  Supervising Physician: Nathan [name]  Patient Status:  [name] - In-pt  Chief Complaint: Acute cholecystitis   Subjective: Patient eating at time of visit.  Accompanied by wife who is assisting. States his appetite has impoved since yesterday.  Denies abdominal pain.   Allergies: Shellfish-derived products and Zolpidem tartrate  Medications: Prior to Admission medications   Medication Sig Start Date End Date Taking? Authorizing Provider  allopurinol (ZYLOPRIM) 300 MG tablet Take 300 mg by mouth daily.   Yes [provider]  ascorbic acid (VITAMIN C) 500 MG tablet Take by mouth daily.   Yes [provider]  aspirin 81 MG tablet Take 1 tablet (81 mg total) by mouth daily. Patient taking differently: Take 81 mg by mouth at bedtime. 02/09/16  Yes Nathan Bathe, MD  atorvastatin (LIPITOR) 20 MG tablet Take 20 mg by mouth daily. 07/25/15  Yes [provider]  Cholecalciferol (VITAMIN D-3 PO) Take 1 capsule by mouth in the morning and at bedtime.   Yes [provider]  Coenzyme Q10 (COQ10) 100 MG CAPS Take 100 mg by mouth daily.   Yes [provider]  fish oil-omega-3 fatty acids 1000 MG capsule Take by mouth daily.   Yes [provider]  glipiZIDE (GLUCOTROL) 5 MG tablet Take 5 mg by mouth every morning.   Yes [provider]  hydrochlorothiazide (MICROZIDE) 12.5 MG capsule Take 12.5 mg by mouth daily. Reported on 08/20/2015 10/13/10  Yes Nathan Clement, MD  isosorbide mononitrate (IMDUR) 30 MG 24 hr tablet TAKE 1 TABLET (30 MG TOTAL) BY MOUTH DAILY. **DO NOT CRUSH** 07/20/22  Yes [name], Nathan George, NP  metFORMIN (GLUCOPHAGE) 1000 MG tablet Take 1,000 mg by mouth in the morning and at bedtime.   Yes [provider]  metoprolol (LOPRESSOR) 50 MG tablet Take 50 mg by mouth 2 (two) times daily.   Yes [provider]  minoxidil (LONITEN) 10 MG tablet Take 10 mg by  mouth daily.  07/20/15  Yes [provider]  VITAMIN A PO Take by mouth daily.   Yes [provider]  acetaminophen (TYLENOL) 500 MG tablet Take 500 mg by mouth daily as needed for moderate pain. Patient not taking: Reported on 02/23/2023    [provider]  ALPRAZolam Prudy Feeler) 0.25 MG tablet Take 0.25 mg by mouth daily as needed for anxiety. Patient not taking: Reported on 02/23/2023    [provider]  amLODipine (NORVASC) 5 MG tablet Take 5 mg by mouth every morning. Patient not taking: Reported on 02/23/2023    [provider]  amoxicillin-clavulanate (AUGMENTIN) 875-125 MG tablet Take 1 tablet by mouth every 12 (twelve) hours. Patient not taking: Reported on 02/23/2023 01/21/23   Nathan Kanner T, PA-C  Cyanocobalamin (VITAMIN B-12 IJ) Inject 1,000 mcg into the muscle every 30 (thirty) days. Amt/dose is unknown Patient not taking: Reported on 02/23/2023    [provider]  cycloSPORINE (RESTASIS) 0.05 % ophthalmic emulsion Place 1 drop into both eyes 2 (two) times daily. Patient not taking: Reported on 02/23/2023    [provider]  fluorouracil (EFUDEX) 5 % cream Apply topically daily. Patient not taking: Reported on 02/23/2023 06/10/22   [provider]  HYDROcodone-acetaminophen (VICODIN) 5-500 MG per tablet Take 1 tablet by mouth daily as needed for pain.  Patient not taking: Reported on 02/23/2023    [provider]  ketoconazole (NIZORAL) 2 % cream 2 (two) times daily. Patient not taking: Reported  on 02/23/2023 06/10/22   [provider]  meloxicam (MOBIC) 15 MG tablet Take 15 mg by mouth as needed for pain. Patient not taking: Reported on 02/23/2023 05/26/22   [provider]  Multiple Vitamin (MULTIVITAMIN) tablet Take 1 tablet by mouth daily. Patient not taking: Reported on 02/23/2023    [provider]  nitroGLYCERIN (NITROSTAT) 0.4 MG SL tablet Place 1 tablet under the tongue as  needed. Patient not taking: Reported on 02/23/2023 09/01/16   [provider]  oxyCODONE-acetaminophen (PERCOCET/ROXICET) 5-325 MG tablet Take 1 tablet by mouth daily as needed for moderate pain or severe pain.  Patient not taking: Reported on 02/23/2023    [provider]  trolamine salicylate (ASPERCREME) 10 % cream Apply 1 application topically as needed for muscle pain. Patient not taking: Reported on 02/23/2023    [provider]     Vital Signs: BP (!) 91/54 (BP Location: Left Arm)   Pulse 79   Temp 97.7 F (36.5 C) (Oral)   Resp 18   Ht 5\' 7"  (1.702 m)   Wt 152 lb 12.5 oz (69.3 kg)   SpO2 100%   BMI 23.93 kg/m   Physical Exam NAD, alert Abdomen: RUQ drain in place.  Insertion site with small amount of dried blood, no active oozing or signs of recent bleeding.  Dressing also with small, quarter-sized amount of dried blood.  Stitch intact.  Dark bilious output in gravity bag.   Imaging: CT CHEST ABDOMEN PELVIS WO CONTRAST  Addendum Date: 03/02/2023   ADDENDUM REPORT: 03/02/2023 17:46 ADDENDUM: These results were called by telephone at the time of interpretation on 03/01/2023 at 5:55 pm to provider Nathan [name], who verbally acknowledged these results. Electronically Signed   By: Darliss Cheney M.D.   On: 03/02/2023 17:46   Result Date: 03/02/2023 CLINICAL DATA:  Chest pain, infection. EXAM: CT CHEST, ABDOMEN AND PELVIS WITHOUT CONTRAST TECHNIQUE: Multidetector CT imaging of the chest, abdomen and pelvis was performed following the standard protocol without IV contrast. RADIATION DOSE REDUCTION: This exam was performed according to the departmental dose-optimization program which includes automated exposure control, adjustment of the mA and/or kV according to patient size and/or use of iterative reconstruction technique. COMPARISON:  CT chest abdomen and pelvis 01/21/2023 FINDINGS: CT CHEST FINDINGS Cardiovascular: Heart is mildly enlarged. Aorta is normal in size.  Patient is status post cardiac surgery. There are atherosclerotic calcifications of the aorta and coronary arteries. There is no pericardial effusion. Mediastinum/Nodes: No enlarged mediastinal, hilar, or axillary lymph nodes. Thyroid gland, trachea, and esophagus demonstrate no significant findings. Lungs/Pleura: There are diffuse peripheral scattered reticular opacities throughout both lungs, slightly increased from prior. There is no lung consolidation, pleural effusion or pneumothorax. Musculoskeletal: There are chronic left third, fourth, fifth, sixth rib fractures. There is subacute/acute nondisplaced posterolateral left eighth rib fracture. Sternotomy wires are present. Degenerative changes affect the spine. CT ABDOMEN PELVIS FINDINGS Hepatobiliary: Gallbladder is dilated. There is diffuse gallbladder wall thickening and surrounding inflammatory stranding. Gallstones are present. There is no biliary ductal dilatation. Liver is within normal limits. Pancreas: Unremarkable. No pancreatic ductal dilatation or surrounding inflammatory changes. Spleen: There are calcified granulomas in the spleen. The spleen is otherwise within normal limits. Adrenals/Urinary Tract: There is a 4.4 cm left renal cyst. There is no hydronephrosis. Vascular calcifications are noted in both kidneys. The bladder and adrenal glands are within normal limits. Stomach/Bowel: Stomach is within normal limits. Appendix appears normal. No evidence of bowel wall thickening, distention, or inflammatory  changes. There is sigmoid and descending colon diverticulosis. Vascular/Lymphatic: Aortic atherosclerosis. No enlarged abdominal or pelvic lymph nodes. Reproductive: Prostate is unremarkable. Other: There is a small fat containing right inguinal hernia. There is mild presacral edema. There is no ascites. Musculoskeletal: No acute fracture or dislocation. Degenerative changes affect the spine. There is incompletely imaged heterogeneous mixed hyper  and hypodensity in the region of the adductor musculature measuring 9.0 x 8.6 cm image 3/125. IMPRESSION: 1. Findings compatible with acute cholecystitis. 2. Subacute/acute nondisplaced left eighth rib fracture. 3. Diffuse peripheral reticular opacities throughout both lungs, slightly increased from prior, worrisome for chronic interstitial lung disease. 4. Incompletely imaged heterogeneous mixed hyper and hypodensity in the region of the right adductor musculature measuring 9.0 x 8.6 cm. Findings may represent hematoma, but other etiologies are not excluded. Correlate clinically for infection. 5. Colonic diverticulosis. Aortic Atherosclerosis (ICD10-I70.0). Electronically Signed: By: Darliss Cheney M.D. On: 03/01/2023 19:25   IR Perc Cholecystostomy  Result Date: 03/02/2023 INDICATION: 78 year old male with calculus cholecystitis referred for percutaneous cholecystostomy EXAM: CHOLECYSTOSTOMY MEDICATIONS: None ANESTHESIA/SEDATION: Moderate (conscious) sedation was not employed during this procedure. A total of Versed 1.0 mg and Fentanyl 0 mcg was administered intravenously. Moderate Sedation Time: 0 minutes. The patient's level of consciousness and vital signs were monitored continuously by radiology nursing throughout the procedure under my direct supervision. FLUOROSCOPY TIME:  Fluoroscopy Time:  (3 mGy). COMPLICATIONS: None PROCEDURE: Informed written consent was obtained from the patient and the patient's family after a thorough discussion of the procedural risks, benefits and alternatives. All questions were addressed. Maximal Sterile Barrier Technique was utilized including caps, mask, sterile gowns, sterile gloves, sterile drape, hand hygiene and skin antiseptic. A timeout was performed prior to the initiation of the procedure. Ultrasound survey of the right upper quadrant was performed for planning purposes. Once the patient is prepped and draped in the usual sterile fashion, the skin and subcutaneous  tissues overlying the gallbladder were generously infiltrated 1% lidocaine for local anesthesia. A coaxial needle was advanced under ultrasound guidance through the skin subcutaneous tissues and a small segment of liver into the gallbladder lumen. With removal of the stylet, spontaneous dark bile drainage occurred. Using modified Seldinger technique, a 10 French drain was placed into the gallbladder fossa, with aspiration of the sample for the lab. Contrast injection confirmed position of the tube within the gallbladder lumen. Drainage catheter was attached to gravity drain with a suture retention placed. Patient tolerated the procedure well and remained hemodynamically stable throughout. No complications were encountered and no significant blood loss encountered. IMPRESSION: Status post image guided percutaneous cholecystostomy. Signed, Yvone Neu. Miachel Roux, RPVI [facility] Radiology Electronically Signed   By: Gilmer Mor D.O.   On: 03/02/2023 17:21   NM Hepatobiliary Liver Func  Result Date: 03/02/2023 CLINICAL DATA:  Concern for cholecystitis. RIGHT upper quadrant pain EXAM: NUCLEAR MEDICINE HEPATOBILIARY IMAGING TECHNIQUE: Sequential images of the abdomen were obtained out to 60 minutes following intravenous administration of radiopharmaceutical. RADIOPHARMACEUTICALS:  5.2 mCi Tc-20m  Choletec IV COMPARISON:  CT 03/01/2023 FINDINGS: Prompt clearance radiotracer from blood pool and homogeneous uptake in liver. Counts are evident in the small bowel by 15 minutes. Gallbladder fails to fill at 60 minutes. 2.8 mg IV morphine was administered to augment filling of the gallbladder. Gallbladder fails to fill following morphine augmentation. IMPRESSION: 1. Non filling of the gallbladder consistent with cystic duct obstruction/ACUTE CHOLECYSTITIS. 2. Patent common bile duct. These results will be called to  the ordering clinician or representative by the  Radiologist Assistant, and communication documented in the PACS or [employer]. Electronically Signed   By: Genevive Bi M.D.   On: 03/02/2023 15:56   US Abdomen Limited RUQ (LIVER/GB)  Result Date: 03/02/2023 CLINICAL DATA:  Findings on CT concerning for acute cholecystitis EXAM: ULTRASOUND ABDOMEN LIMITED RIGHT UPPER QUADRANT COMPARISON:  CT abdomen and pelvis 03/01/2023 FINDINGS: Gallbladder: Mild wall thickening and pericholecystic fluid. Sludge and stones in the gallbladder including in the gallbladder neck measuring up to 1.3 cm. Common bile duct: Diameter: 5 mm.  No intrahepatic biliary dilation. Liver: No focal lesion identified. Within normal limits in parenchymal echogenicity. Portal vein is patent on color Doppler imaging with normal direction of blood flow towards the liver. Other: None. IMPRESSION: Findings compatible with acute cholecystitis. Electronically Signed   By: Minerva Fester M.D.   On: 03/02/2023 02:46   ECHOCARDIOGRAM LIMITED  Result Date: 03/01/2023    ECHOCARDIOGRAM LIMITED REPORT   Patient Name:   Nathan [name] Date of Exam: 02/28/2023 Medical Rec #:  [id number]      Height:       67.0 in Accession #:    [id number]     Weight:       151.5 lb Date of Birth:  [birth year]     BSA:          1.797 m Patient Age:    76 years       BP:           113/57 mmHg Patient Gender: M              HR:           77 bpm. Exam Location:  Inpatient Procedure: Limited Echo, Cardiac Doppler, Color Doppler and Intracardiac            Opacification Agent Indications:    I51.9 Decreased LV function. Rule out LV thrombus.  History:        Patient has prior history of Echocardiogram examinations, most                 recent 02/24/2023. CAD, Prior CABG, Aortic Valve Disease,                 Signs/Symptoms:Chest Pain; Risk Factors:Hypertension, Sleep                 Apnea, Dyslipidemia and Diabetes. AVR.                 Aortic Valve: 23 mm Edwards bioprosthetic valve is present in                 the  aortic position.  Sonographer:    Sheralyn Boatman RDCS Referring Phys: [phone] MICHAEL COOPER  Sonographer Comments: Technically difficult study due to poor echo windows. IMPRESSIONS  1. Left ventricular ejection fraction, by estimation, is 25 to 30%. The left ventricle has severely decreased function. The left ventricle demonstrates regional wall motion abnormalities (see scoring diagram/findings for description). There is mild concentric left ventricular hypertrophy. There is global hypokinesis but more severe hypokinesis of the left ventricular, inferior wall, inferoseptal wall and apical segment.  2. The mitral valve is degenerative. Trivial mitral valve regurgitation. Moderate mitral annular calcification.  3. The aortic valve has been repaired/replaced. There is a 23 mm Edwards bioprosthetic valve present in the aortic position.  4. No evidence of apical LV thrombus by definity contrast. FINDINGS  Left Ventricle: Left ventricular ejection fraction, by estimation,  is 25 to 30%. The left ventricle has severely decreased function. The left ventricle demonstrates regional wall motion abnormalities. Definity contrast agent was given IV to delineate the left ventricular endocardial borders. There is mild concentric left ventricular hypertrophy. Mitral Valve: The mitral valve is degenerative in appearance. There is moderate thickening of the mitral valve leaflet(s). There is moderate calcification of the mitral valve leaflet(s). Moderately decreased mobility of the mitral valve leaflets. Moderate mitral annular calcification. Trivial mitral valve regurgitation. Aortic Valve: The aortic valve has been repaired/replaced. There is a 23 mm Edwards bioprosthetic valve present in the aortic position. Additional Comments: Spectral Doppler performed. Color Doppler performed.  LEFT VENTRICLE PLAX 2D LVIDd:         5.10 cm LVIDs:         4.40 cm LV PW:         1.30 cm LV IVS:        1.20 cm  LV Volumes (MOD) LV vol d, MOD A2C: 143.0 ml  LV vol d, MOD A4C: 168.5 ml LV vol s, MOD A2C: 103.0 ml LV vol s, MOD A4C: 116.5 ml LV SV MOD A2C:     40.0 ml LV SV MOD A4C:     168.5 ml LV SV MOD BP:      48.7 ml IVC IVC diam: 2.60 cm Armanda Magic MD Electronically signed by Armanda Magic MD Signature Date/Time: 03/01/2023/8:15:09 AM    Final    CT HEAD WO CONTRAST ( )  Result Date: 03/01/2023 CLINICAL DATA:  Mental status change, unknown cause EXAM: CT HEAD WITHOUT CONTRAST TECHNIQUE: Contiguous axial images were obtained from the base of the skull through the vertex without intravenous contrast. RADIATION DOSE REDUCTION: This exam was performed according to the departmental dose-optimization program which includes automated exposure control, adjustment of the mA and/or kV according to patient size and/or use of iterative reconstruction technique. COMPARISON:  None Available. FINDINGS: Brain: No evidence of acute infarction, hemorrhage, mass, mass effect, or midline shift. No hydrocephalus or extra-axial fluid collection. Vascular: No hyperdense vessel. Atherosclerotic calcifications in the intracranial carotid and vertebral arteries. Skull: Negative for fracture or focal lesion. Sinuses/Orbits: Left maxillary mucous retention cyst. Status post bilateral lens replacements. Other: The mastoid air cells are well aerated. IMPRESSION: No acute intracranial process. Electronically Signed   By: Wiliam Ke M.D.   On: 03/01/2023 03:32   DG Chest 2 View  Result Date: 02/28/2023 CLINICAL DATA:  Pneumonia. EXAM: CHEST - 2 VIEW COMPARISON:  February 25, 2023. FINDINGS: Stable cardiomegaly. Status post coronary artery bypass graft and aortic valve repair. Lungs are clear. Old left rib fractures are noted. IMPRESSION: No active cardiopulmonary disease. Electronically Signed   By: Lupita Raider M.D.   On: 02/28/2023 13:45    Labs:  CBC: Recent Labs    02/28/23 1644 03/01/23 0248 03/02/23 0942 03/03/23 0418  WBC 20.8* 19.6* 20.5* 25.7*  HGB 8.2* 7.4*  6.2* 8.2*  HCT 22.9* 20.5* 17.9* 23.2*  PLT 215 184 226 173    COAGS: Recent Labs    02/23/23 1616 03/03/23 0418  INR  --  1.3*  APTT 36  --     BMP: Recent Labs    02/28/23 1644 03/01/23 0248 03/02/23 0942 03/03/23 0418  NA 137 136 139 140  K 3.0* 2.8* 3.7 3.7  CL 100 102 106 108  CO2 22 20* 19* 18*  GLUCOSE 199* 143* 179* 159*  BUN 45* 45* 54* 64*  CALCIUM 8.5* 7.9* 8.3* 8.3*  CREATININE 1.96* 1.92* 2.01* 2.49*  GFRNONAA 35* 35* 34* 26*    LIVER FUNCTION TESTS: Recent Labs    02/28/23 0659 02/28/23 1644 03/02/23 0942 03/03/23 0418  BILITOT 2.5* 2.4* 1.8* 1.4*  AST 72* 73* 71* 69*  ALT 58* 60* 52* 47*  ALKPHOS 81 74 64 61  PROT 5.8* 6.1* 5.8* 5.2*  ALBUMIN 2.2* 2.2* 2.2* 2.0*    Assessment and Plan: Acute cholecystitis s/p percutaneous cholecystostomy tube placement 8/22 by Dr. Loreta Ave Patient assessed at bedside this AM.   Nathan [name] Location: RUQ Size: Fr size: 10 Fr Date of placement: 8/22  Currently to: Drain collection device: gravity 24 hour output:  Output by Drain (mL) 03/01/23 0701 - 03/01/23 1900 03/01/23 1901 - 03/02/23 0700 03/02/23 0701 - 03/02/23 1900 03/02/23 1901 - 03/03/23 0700 03/03/23 0701 - 03/03/23 1410  Biliary Tube Cook slip-coat 10.2 Fr. RUQ    325     Plan: Continue TID flushes with 5 cc NS. Record output Q shift. Dressing changes QD or PRN if soiled.  Call IR APP or on call IR MD if difficulty flushing or sudden change in drain output.   Discharge planning: Percutaneous cholecystostomy drain to remain in place at least 6 weeks. Recommend fluoroscopy with injection of the drain in IR to evaluate for patency of the cystic duct in 8-12 weeks.  IR will continue to follow - please call with questions or concerns.   Electronically Signed: Hoyt Koch, PA 03/03/2023, 2:05 PM   I spent a total of 15 Minutes at the the patient's bedside AND on the patient's hospital floor or unit, greater than 50% of which was  counseling/coordinating care for acute cholecystitis.

## 2023-03-03 NOTE — Progress Notes (Signed)
PHARMACY NOTE:  ANTIMICROBIAL RENAL DOSAGE ADJUSTMENT  Current antimicrobial regimen includes a mismatch between antimicrobial dosage and estimated renal function.  As per policy approved by the Pharmacy & Therapeutics and Medical Executive Committees, the antimicrobial dosage will be adjusted accordingly.  Current antimicrobial dosage:  Unasyn 3gm IV q8h  Indication: acute cholecystitis   Renal Function:  Estimated Creatinine Clearance: 23.2 mL/min (A) (by C-G formula based on SCr of 2.49 mg/dL (H)). []      On intermittent HD, scheduled: []      On CRRT    Antimicrobial dosage has been changed to:  Unasyn 3gm IV q12h  Additional comments:   Thank you for allowing pharmacy to be a part of this patient's care.  Harland German, PharmD Clinical Pharmacist **Pharmacist phone directory can now be found on [email or web address] ([email or web address]).  Listed under [facility].

## 2023-03-03 NOTE — Progress Notes (Signed)
Physical Therapy Treatment Patient Details Name: Nathan [name] MRN: [id number] DOB: 1946/06/15 Today's Date: 03/03/2023   History of Present Illness Pt is 77 yo male presented on 02/20/23 with chest pain and found to have NSTEMI.  Pt was scheduled to undergo cardiac cath but developed overload/CHF requiring transfer to ICU. Pt found to be septic with cholecystitis with MD placing chole drain on 8/22.  Cardiac cath will be postponed until sepsis improved.  PMH: CAD s/p CABGx3 in 2010, aortic valve replacement, DM, HTN, HLD, MI, OSA    PT Comments  Pt admitted with above diagnosis. Pt was able to ambulate with RW but limited distance today as pt with incr confusion and having some knee buckling as well.  Will continue to follow pt acutely.  Pt currently with functional limitations due to the deficits listed below (see PT Problem List). Pt will benefit from acute skilled PT to increase their independence and safety with mobility to allow discharge.       If plan is discharge home, recommend the following: A little help with walking and/or transfers;A little help with bathing/dressing/bathroom;Assistance with cooking/housework;Help with stairs or ramp for entrance   Can travel by private vehicle        Equipment Recommendations  Rolling walker (2 wheels);Other (comment) (issued gait belt)    Recommendations for Other Services       Precautions / Restrictions Precautions Precautions: Fall Precaution Comments: Chole drain right side Restrictions Weight Bearing Restrictions: No     Mobility  Bed Mobility Overal bed mobility: Needs Assistance Bed Mobility: Supine to Sit     Supine to sit: Min assist Sit to supine: Min assist   General bed mobility comments: Assist needed to come to bed and to lie back down.  Pt with difficulty processing commands.    Transfers Overall transfer level: Needs assistance Equipment used: Rolling walker (2 wheels) Transfers: Sit to/from Stand Sit to  Stand: Mod assist           General transfer comment: Needed assist to stand to RW and cues for hand placement. Pt rocking and used momentum to stand.  Needed more assist to stand today than last visit with intiial posterior lean.    Ambulation/Gait Ambulation/Gait assistance: Mod assist Gait Distance (Feet): 40 Feet Assistive device: Rolling walker (2 wheels) Gait Pattern/deviations: Step-through pattern, Decreased stride length, Trunk flexed, Wide base of support, Drifts right/left, Leaning posteriorly Gait velocity: decreased Gait velocity interpretation: <1.31 ft/sec, indicative of household ambulator   General Gait Details: Mod assist  to ambulate with RW for safety as pt tends to need cues to stay close to RW and for upright stance as he leans forward as he fatigues. Pt could only ambulate to door and back to bed due to confusion and weakness. Pt not following commands well and determined to have pt not walk as far due to this. Knees buckled x 2 during walk.  Pt needs cues to slow down as he moves impulsively at times.   Stairs             Wheelchair Mobility     Tilt Bed    Modified Rankin (Stroke Patients Only)       Balance Overall balance assessment: Needs assistance Sitting-balance support: No upper extremity supported Sitting balance-Leahy Scale: Good     Standing balance support: Bilateral upper extremity supported, Reliant on assistive device for balance Standing balance-Leahy Scale: Poor Standing balance comment: Requiring RW and min assist static stance, mod assist dynamically  Cognition Arousal: Alert Behavior During Therapy: Flat affect Overall Cognitive Status: No family/caregiver present to determine baseline cognitive functioning Area of Impairment: Problem solving                             Problem Solving: Slow processing General Comments: Pt with incr confusion today. Not oriented to  place, time or situation. Pt kept stating," I just dont understand what is going on."        Exercises General Exercises - Lower Extremity Long Arc Quad: AROM, Both, 10 reps, Seated Hip Flexion/Marching: AROM, Both, 10 reps, Seated    General Comments General comments (skin integrity, edema, etc.): VSS      Pertinent Vitals/Pain Pain Assessment Pain Assessment: No/denies pain    Home Living                          Prior Function            PT Goals (current goals can now be found in the care plan section) Acute Rehab PT Goals Patient Stated Goal: return home Progress towards PT goals: Progressing toward goals    Frequency    Min 1X/week      PT Plan      Co-evaluation              AM-PAC PT "6 Clicks" Mobility   Outcome Measure  Help needed turning from your back to your side while in a flat bed without using bedrails?: A Little Help needed moving from lying on your back to sitting on the side of a flat bed without using bedrails?: A Little Help needed moving to and from a bed to a chair (including a wheelchair)?: A Little Help needed standing up from a chair using your arms (e.g., wheelchair or bedside chair)?: A Little Help needed to walk in hospital room?: A Little Help needed climbing 3-5 steps with a railing? : A Lot 6 Click Score: 17    End of Session Equipment Utilized During Treatment: Gait belt Activity Tolerance: Patient limited by fatigue Patient left: with call bell/phone within reach;in bed;with bed alarm set Nurse Communication: Mobility status PT Visit Diagnosis: Other abnormalities of gait and mobility (R26.89);Muscle weakness (generalized) (M62.81)     Time: 5188-4166 PT Time Calculation (min) (ACUTE ONLY): 17 min  Charges:    $Gait Training: 8-22 mins PT General Charges $$ ACUTE PT VISIT: 1 Visit                     Annella Prowell M,PT Acute Rehab Services [phone]    Bevelyn Buckles 03/03/2023, 5:02 PM

## 2023-03-03 NOTE — Progress Notes (Signed)
ANTICOAGULATION CONSULT NOTE- Follow Up  Pharmacy Consult for Heparin  Indication: chest pain/ACS  Allergies  Allergen Reactions   Shellfish-Derived Products Anaphylaxis   Zolpidem Tartrate Other (See Comments)    Hallucinations    Patient Measurements: Height: 5\' 7"  (170.2 cm) Weight: 69.3 kg (152 lb 12.5 oz) IBW/kg (Calculated) : 66.1 Heparin DW = 68 kg  Vital Signs: Temp: 97.7 F (36.5 C) (08/23 1145) Temp Source: Oral (08/23 1145) BP: 91/54 (08/23 1145) Pulse Rate: 79 (08/23 1145)  Labs: Recent Labs    03/01/23 0248 03/01/23 1235 03/01/23 2328 03/02/23 0942 03/03/23 0418  HGB 7.4*  --   --  6.2* 8.2*  HCT 20.5*  --   --  17.9* 23.2*  PLT 184  --   --  226 173  LABPROT  --   --   --   --  16.1*  INR  --   --   --   --  1.3*  HEPARINUNFRC 0.96* 0.96* 0.59  --   --   CREATININE 1.92*  --   --  2.01* 2.49*    Estimated Creatinine Clearance: 23.2 mL/min (A) (by C-G formula based on SCr of 2.49 mg/dL (H)).   Assessment: 77 y/o M with chest pain at rest beginning 8/11, unrelieved by SL NTG, some help with morphine.  Unable to get LHC on 8/14 due to agitation, now awaiting improvement in renal function.  No anticoagulation PTA,   Most recent echo with no thrombus but he is noted with afib. Heparin has been off for leg hematoma and hg= 7.6 on 8/22. Heparin to restart today with no bolus -hg= 8.2 -heparin level was 0.59 on 1000 units/hr   Goal of Therapy:  Heparin level 0.3-0.5 Monitor platelets by anticoagulation protocol: Yes   Plan:  -restart heparin at 950 units/hr -Heparin level in 8 hours and daily wth CBC daily  Harland German, PharmD Clinical Pharmacist **Pharmacist phone directory can now be found on [email or web address] ([email or web address]).  Listed under [facility].

## 2023-03-03 NOTE — Progress Notes (Signed)
Pt's wife requesting to be contacted daily by rounding physicians if she is not at bedside

## 2023-03-03 NOTE — Progress Notes (Addendum)
Rounding Note    Patient Name: Nathan [name] Date of Encounter: 03/03/2023  [facility] Cardiologist: Donato Schultz, MD   Subjective   No complaints today, denies SOB. Alert and oriented to self and place. Does not recall the pain that initially brought him in.   Inpatient Medications    Scheduled Meds:  allopurinol  300 mg Oral Daily   vitamin C  1,000 mg Oral Daily   atorvastatin  80 mg Oral Daily   docusate sodium  100 mg Oral BID   insulin aspart  0-9 Units Subcutaneous Q4H   melatonin  5 mg Oral QHS   metoprolol tartrate  25 mg Oral BID   polyethylene glycol  17 g Oral Daily   sodium chloride flush  5 mL Intracatheter Q8H   Continuous Infusions:  sodium chloride Stopped (02/24/23 0824)   cefOXitin Stopped (03/02/23 1900)   piperacillin-tazobactam (ZOSYN)  IV     PRN Meds: sodium chloride, acetaminophen, cefOXitin, midazolam, nitroGLYCERIN, mouth rinse, oxyCODONE-acetaminophen, senna-docusate   Vital Signs    Vitals:   03/02/23 2359 03/03/23 0045 03/03/23 0419 03/03/23 0842  BP: 94/60 112/76 110/61 101/70  Pulse: 72 92 89 91  Resp: 16 18 20 20   Temp: 99 F (37.2 C) 97.9 F (36.6 C) 98.8 F (37.1 C)   TempSrc: Oral Oral Axillary Oral  SpO2: 100% 100% 100% 98%  Weight:   69.3 kg   Height:        Intake/Output Summary (Last 24 hours) at 03/03/2023 1044 Last data filed at 03/03/2023 0421 Gross per 24 hour  Intake 918.17 ml  Output 575 ml  Net 343.17 ml      03/03/2023    4:19 AM 02/25/2023    6:42 PM 02/23/2023    6:00 AM  Last 3 Weights  Weight (lbs) 152 lb 12.5 oz 151 lb 7.3 oz 151 lb 7.3 oz  Weight (kg) 69.3 kg 68.7 kg 68.7 kg      Telemetry    Afib in the 80s - Personally Reviewed  ECG    No new tracings - Personally Reviewed  Physical Exam   GEN: No acute distress.   Neck: + JVD Cardiac: irregular rhythm, regular rate Respiratory: crackles in bases GI: Soft, nontender, non-distended, left perc drain in place MS: No edema;  No deformity. Neuro:  Nonfocal  Psych: Normal affect   Labs    High Sensitivity Troponin:   Recent Labs  Lab 02/20/23 2120 02/20/23 2344 02/21/23 0624 02/21/23 1815 02/21/23 2110  TROPONINIHS 13 12 31* 4,527* 6,445*     Chemistry Recent Labs  Lab 02/25/23 0749 02/26/23 0141 02/28/23 1644 03/01/23 0248 03/02/23 0942 03/03/23 0418  NA 134*   < > 137 136 139 140  K 3.5   < > 3.0* 2.8* 3.7 3.7  CL 97*   < > 100 102 106 108  CO2 18*   < > 22 20* 19* 18*  GLUCOSE 198*   < > 199* 143* 179* 159*  BUN 62*   < > 45* 45* 54* 64*  CREATININE 2.29*   < > 1.96* 1.92* 2.01* 2.49*  CALCIUM 8.4*   < > 8.5* 7.9* 8.3* 8.3*  MG 2.0  --   --   --  2.1  --   PROT 6.0*   < > 6.1*  --  5.8* 5.2*  ALBUMIN 2.6*   < > 2.2*  --  2.2* 2.0*  AST 73*   < > 73*  --  71* 69*  ALT 60*   < > 60*  --  52* 47*  ALKPHOS 95   < > 74  --  64 61  BILITOT 2.7*   < > 2.4*  --  1.8* 1.4*  GFRNONAA 29*   < > 35* 35* 34* 26*  ANIONGAP 19*   < > 15 14 14 14    < > = values in this interval not displayed.    Lipids No results for input(s): "CHOL", "TRIG", "HDL", "LABVLDL", "LDLCALC", "CHOLHDL" in the last 168 hours.  Hematology Recent Labs  Lab 03/01/23 0248 03/02/23 0942 03/03/23 0418  WBC 19.6* 20.5* 25.7*  RBC 2.51* 2.15* 2.76*  HGB 7.4* 6.2* 8.2*  HCT 20.5* 17.9* 23.2*  MCV 81.7 83.3 84.1  MCH 29.5 28.8 29.7  MCHC 36.1* 34.6 35.3  RDW 17.2* 17.4* 17.9*  PLT 184 226 173   Thyroid No results for input(s): "TSH", "FREET4" in the last 168 hours.  BNPNo results for input(s): "BNP", "PROBNP" in the last 168 hours.  DDimer No results for input(s): "DDIMER" in the last 168 hours.   Radiology    CT CHEST ABDOMEN PELVIS WO CONTRAST  Addendum Date: 03/02/2023   ADDENDUM REPORT: 03/02/2023 17:46 ADDENDUM: These results were called by telephone at the time of interpretation on 03/01/2023 at 5:55 pm to provider Dr. Antionette Char, who verbally acknowledged these results. Electronically Signed   By: Darliss Cheney M.D.    On: 03/02/2023 17:46   Result Date: 03/02/2023 CLINICAL DATA:  Chest pain, infection. EXAM: CT CHEST, ABDOMEN AND PELVIS WITHOUT CONTRAST TECHNIQUE: Multidetector CT imaging of the chest, abdomen and pelvis was performed following the standard protocol without IV contrast. RADIATION DOSE REDUCTION: This exam was performed according to the departmental dose-optimization program which includes automated exposure control, adjustment of the mA and/or kV according to patient size and/or use of iterative reconstruction technique. COMPARISON:  CT chest abdomen and pelvis 01/21/2023 FINDINGS: CT CHEST FINDINGS Cardiovascular: Heart is mildly enlarged. Aorta is normal in size. Patient is status post cardiac surgery. There are atherosclerotic calcifications of the aorta and coronary arteries. There is no pericardial effusion. Mediastinum/Nodes: No enlarged mediastinal, hilar, or axillary lymph nodes. Thyroid gland, trachea, and esophagus demonstrate no significant findings. Lungs/Pleura: There are diffuse peripheral scattered reticular opacities throughout both lungs, slightly increased from prior. There is no lung consolidation, pleural effusion or pneumothorax. Musculoskeletal: There are chronic left third, fourth, fifth, sixth rib fractures. There is subacute/acute nondisplaced posterolateral left eighth rib fracture. Sternotomy wires are present. Degenerative changes affect the spine. CT ABDOMEN PELVIS FINDINGS Hepatobiliary: Gallbladder is dilated. There is diffuse gallbladder wall thickening and surrounding inflammatory stranding. Gallstones are present. There is no biliary ductal dilatation. Liver is within normal limits. Pancreas: Unremarkable. No pancreatic ductal dilatation or surrounding inflammatory changes. Spleen: There are calcified granulomas in the spleen. The spleen is otherwise within normal limits. Adrenals/Urinary Tract: There is a 4.4 cm left renal cyst. There is no hydronephrosis. Vascular  calcifications are noted in both kidneys. The bladder and adrenal glands are within normal limits. Stomach/Bowel: Stomach is within normal limits. Appendix appears normal. No evidence of bowel wall thickening, distention, or inflammatory changes. There is sigmoid and descending colon diverticulosis. Vascular/Lymphatic: Aortic atherosclerosis. No enlarged abdominal or pelvic lymph nodes. Reproductive: Prostate is unremarkable. Other: There is a small fat containing right inguinal hernia. There is mild presacral edema. There is no ascites. Musculoskeletal: No acute fracture or dislocation. Degenerative changes affect the spine. There is incompletely imaged heterogeneous  mixed hyper and hypodensity in the region of the adductor musculature measuring 9.0 x 8.6 cm image 3/125. IMPRESSION: 1. Findings compatible with acute cholecystitis. 2. Subacute/acute nondisplaced left eighth rib fracture. 3. Diffuse peripheral reticular opacities throughout both lungs, slightly increased from prior, worrisome for chronic interstitial lung disease. 4. Incompletely imaged heterogeneous mixed hyper and hypodensity in the region of the right adductor musculature measuring 9.0 x 8.6 cm. Findings may represent hematoma, but other etiologies are not excluded. Correlate clinically for infection. 5. Colonic diverticulosis. Aortic Atherosclerosis (ICD10-I70.0). Electronically Signed: By: Darliss Cheney M.D. On: 03/01/2023 19:25   IR Perc Cholecystostomy  Result Date: 03/02/2023 INDICATION: 77 year old male with calculus cholecystitis referred for percutaneous cholecystostomy EXAM: CHOLECYSTOSTOMY MEDICATIONS: None ANESTHESIA/SEDATION: Moderate (conscious) sedation was not employed during this procedure. A total of Versed 1.0 mg and Fentanyl 0 mcg was administered intravenously. Moderate Sedation Time: 0 minutes. The patient's level of consciousness and vital signs were monitored continuously by radiology nursing throughout the procedure  under my direct supervision. FLUOROSCOPY TIME:  Fluoroscopy Time:  (3 mGy). COMPLICATIONS: None PROCEDURE: Informed written consent was obtained from the patient and the patient's family after a thorough discussion of the procedural risks, benefits and alternatives. All questions were addressed. Maximal Sterile Barrier Technique was utilized including caps, mask, sterile gowns, sterile gloves, sterile drape, hand hygiene and skin antiseptic. A timeout was performed prior to the initiation of the procedure. Ultrasound survey of the right upper quadrant was performed for planning purposes. Once the patient is prepped and draped in the usual sterile fashion, the skin and subcutaneous tissues overlying the gallbladder were generously infiltrated 1% lidocaine for local anesthesia. A coaxial needle was advanced under ultrasound guidance through the skin subcutaneous tissues and a small segment of liver into the gallbladder lumen. With removal of the stylet, spontaneous dark bile drainage occurred. Using modified Seldinger technique, a 10 French drain was placed into the gallbladder fossa, with aspiration of the sample for the lab. Contrast injection confirmed position of the tube within the gallbladder lumen. Drainage catheter was attached to gravity drain with a suture retention placed. Patient tolerated the procedure well and remained hemodynamically stable throughout. No complications were encountered and no significant blood loss encountered. IMPRESSION: Status post image guided percutaneous cholecystostomy. Signed, Yvone Neu. Miachel Roux, RPVI [facility] Electronically Signed   By: Gilmer Mor D.O.   On: 03/02/2023 17:21   NM Hepatobiliary Liver Func  Result Date: 03/02/2023 CLINICAL DATA:  Concern for cholecystitis. RIGHT upper quadrant pain EXAM: NUCLEAR MEDICINE HEPATOBILIARY IMAGING TECHNIQUE: Sequential images of the abdomen were obtained out  to 60 minutes following intravenous administration of radiopharmaceutical. RADIOPHARMACEUTICALS:  5.2 mCi Tc-62m  Choletec IV COMPARISON:  CT 03/01/2023 FINDINGS: Prompt clearance radiotracer from blood pool and homogeneous uptake in liver. Counts are evident in the small bowel by 15 minutes. Gallbladder fails to fill at 60 minutes. 2.8 mg IV morphine was administered to augment filling of the gallbladder. Gallbladder fails to fill following morphine augmentation. IMPRESSION: 1. Non filling of the gallbladder consistent with cystic duct obstruction/ACUTE CHOLECYSTITIS. 2. Patent common bile duct. These results will be called to the ordering clinician or representative by the Radiologist Assistant, and communication documented in the PACS or [employer]. Electronically Signed   By: Genevive Bi M.D.   On: 03/02/2023 15:56   US Abdomen Limited RUQ (LIVER/GB)  Result Date: 03/02/2023 CLINICAL DATA:  Findings on CT concerning for acute cholecystitis EXAM: ULTRASOUND ABDOMEN LIMITED  RIGHT UPPER QUADRANT COMPARISON:  CT abdomen and pelvis 03/01/2023 FINDINGS: Gallbladder: Mild wall thickening and pericholecystic fluid. Sludge and stones in the gallbladder including in the gallbladder neck measuring up to 1.3 cm. Common bile duct: Diameter: 5 mm.  No intrahepatic biliary dilation. Liver: No focal lesion identified. Within normal limits in parenchymal echogenicity. Portal vein is patent on color Doppler imaging with normal direction of blood flow towards the liver. Other: None. IMPRESSION: Findings compatible with acute cholecystitis. Electronically Signed   By: Minerva Fester M.D.   On: 03/02/2023 02:46    Cardiac Studies   Limited echo 02/28/23: 1. Left ventricular ejection fraction, by estimation, is 25 to 30%. The  left ventricle has severely decreased function. The left ventricle  demonstrates regional wall motion abnormalities (see scoring  diagram/findings for description). There is mild   concentric left ventricular hypertrophy. There is global hypokinesis but  more severe hypokinesis of the left ventricular, inferior wall,  inferoseptal wall and apical segment.   2. The mitral valve is degenerative. Trivial mitral valve regurgitation.  Moderate mitral annular calcification.   3. The aortic valve has been repaired/replaced. There is a 23 mm Edwards  bioprosthetic valve present in the aortic position.   4. No evidence of apical LV thrombus by definity contrast.    Echo 02/24/23:   1. Diffuse hypokinesis worse in the inferior, inferoseptal and apical  walls; distal inferior akinesis. Cannot completely exlude mural thrombus  at apex, recomm limited echo wit Definity to confirm . Left ventricular  ejection fraction, by estimation, is  25 to 30%. The left ventricle has severely decreased function. Left  ventricular diastolic parameters are indeterminate.   2. Right ventricular systolic function is normal. The right ventricular  size is normal.   3. Left atrial size was mildly dilated.   4. Mild mitral valve regurgitation. Moderate to severe mitral annular  calcification.   5. S/p AVR (23 mm Edwards Bioprosthesis; procedure date 2010) Peak and  mean gradients through the valve are 14 and 7 mm respectively. . The  aortic valve has been repaired/replaced. Aortic valve regurgitation is not  visualized.   6. The inferior vena cava is dilated in size with <50% respiratory  variability, suggesting right atrial pressure of 15 mmHg.   Patient Profile     77 y.o. male with a history of CAD s/p CABG x4 in 10/2008, severe aortic stenosis  s/p AVR in 10/2008 at time of CABG, hypertension, hyperlipidemia, type 2 diabetes mellitus, CKD stage III, GERD, and obstructive sleep apnea who was admitted on 02/20/2023 for NSTEMI after presenting with chest pain. Hospitalization has been complicated by new onset atrial fibrillation, AKI superimposed on CKD, pneumonia, acute cholecystitis, and  encephalopathy/ delirium which has delayed cardiac catheterization.   Due to persistent leukocytosis and waxing confusion, internal medicine was consulted. CT yesterday with acute cholecystitis confirmed on HIDA scan. CT also showed right groin hematoma prompting heparin gtt hold. IR consulted and placed perc drain yesterday. Confusion tends to worsen at night, may be sundowning.  Assessment & Plan    NSTEMI CAD s/p AVR + CABG in 2010 Presented with chest pain concerning for angina and elevated troponin to 6445. Echo, as below, with reduced EF and WMA.  Initial plan was for right and left heart catheterization, but this has been delayed due to worsening renal function, altered mental status -Patient denies chest pain -Heparin drip on hold due to -Continue aspirin 80, statin, 30 mg imdur and 25 mg lopressor BID  Acute on chronic combined systolic and diastolic Echo this admission 02/22/23 showed LVEF 20-25% with global hypokinesis. Repeat echo 02/24/23 with LVEF of 25-30% with diffuse hypokinesis but worse in the inferior, inferospetal, and apical walls as well as distal inferior akinesis. Could not rule out mural thrombus. Repeat limited echo 02/28/23 with definity did not show mural thrombus.  - diuresed with IV lasix but developed worsening renal function - gentle hydration due to possibly intravascularly dry on exam 02/27/23 - will need to follow renal function for guidance on GDMT - will still plan for heart cath once recovered from PNA and cholecystitis - transition lopressor to toprol prior to discharge - hold off on ACEI/ARB/ARNI/MRA - can consider SGLT2i at discharge depending on mobility - on exam, crackles in bases, no edema - can plan for targeted doses of lasix   Hypotension - required midodrine last evening - BP appears stable today   Atrial fibrillation - new diagnosis this admission - rate controlled in the 80-90s, improved from 100-110s - continue lopressor 25 mg BID,  can uptitrate if needed - IV amiodarone was discontinued - rates improving after drain placement, which is reassuring   Need for chronic anticoagulation Anemia  Right groin hematoma - for Afib and NSTEMI - would like to transition to eliquis prior to discharge - anticoagulation has been complicated by groin hematoma seen on CT and anemia, Hb today 8.2 - appears stable - will discuss timing of re-trial of heparin   Hx of AVR 2010 - appears stable valve function on echo   Delirium - worse at night, complicated by infection - head CT unrevealing - urine and blood cultures negative - lactic 2.1 on 02/28/23, now 1.6   PNA Acute cholecystitis s/p perc drain 03/02/23 - ABX per primary   Plans for angiography now on hold as he is being treated for acute cholecystitis. If plan is for cholecystectomy, will likely need an ischemic evaluation prior to surgery, however would not be able to complete PCI with DAPT if surgery needed in the next 6 months. Could consider nuclear stress test for risk stratification.       For questions or updates, please contact [facility] Please consult [email or web address] for contact info under        Signed, Marcelino Duster, PA  03/03/2023, 10:44 AM     ATTENDING ATTESTATION:  After conducting a review of all available clinical information with the care team, interviewing the patient, and performing a physical exam, I agree with the findings and plan described in this note.   GEN: No acute distress.  Confused HEENT:  MMM, no JVD, no scleral icterus Cardiac: iRRR, no murmurs, rubs, or gallops.  Respiratory: Clear to auscultation bilaterally. GI: Soft, nontender, non-distended; drain in place MS: No edema; No deformity. L thigh hematoma improved Neuro:  Nonfocal  Vasc:  +2 radial pulses  The patient had a drain placed by IR to treat acute cholecystitis.  His white count is elevated however to 25.  He is more confused today than he was  yesterday.  His creatinine is now up to 2.49.  We will defer coronary angiography at this time.  It is possible that the patient has developed a septic cardiomyopathy as his ejection fraction in 2022 was normal. His echocardiogram on August 20 also suggest possibly a Takotsubo cardiomyopathy.   Will refer the patient for a limited echocardiogram on Monday to evaluate for LV function to see if this is improved.  If he still  demonstrates severe LV dysfunction I think he probably warrants coronary angiography when his kidney function is improved.  His previous coronary angiography and bypass angiography study in 2017 demonstrated a patent LIMA to LAD and patent vein graft to obtuse marginal however the vein graft to PDA was occluded.  I doubt that his LIMA to LAD is an issue and I suspect nonischemic etiology of his cardiomyopathy.   L thigh hematoma improved, will restart hep gtt without a bolus.    Alverda Skeans, MD Pager [phone]

## 2023-03-04 DIAGNOSIS — I5023 Acute on chronic systolic (congestive) heart failure: Secondary | ICD-10-CM

## 2023-03-04 DIAGNOSIS — G9341 Metabolic encephalopathy: Secondary | ICD-10-CM | POA: Diagnosis not present

## 2023-03-04 DIAGNOSIS — E785 Hyperlipidemia, unspecified: Secondary | ICD-10-CM

## 2023-03-04 DIAGNOSIS — D62 Acute posthemorrhagic anemia: Secondary | ICD-10-CM | POA: Diagnosis not present

## 2023-03-04 DIAGNOSIS — I214 Non-ST elevation (NSTEMI) myocardial infarction: Secondary | ICD-10-CM | POA: Diagnosis not present

## 2023-03-04 LAB — GLUCOSE, CAPILLARY
Glucose-Capillary: 166 mg/dL — ABNORMAL HIGH (ref 70–99)
Glucose-Capillary: 158 mg/dL — ABNORMAL HIGH (ref 70–99)
Glucose-Capillary: 165 mg/dL — ABNORMAL HIGH (ref 70–99)
Glucose-Capillary: 179 mg/dL — ABNORMAL HIGH (ref 70–99)
Glucose-Capillary: 196 mg/dL — ABNORMAL HIGH (ref 70–99)

## 2023-03-04 LAB — BASIC METABOLIC PANEL
Anion gap: 11 (ref 5–15)
Anion gap: 16 — ABNORMAL HIGH (ref 5–15)
BUN: 73 mg/dL — ABNORMAL HIGH (ref 8–23)
BUN: 73 mg/dL — ABNORMAL HIGH (ref 8–23)
CO2: 18 mmol/L — ABNORMAL LOW (ref 22–32)
CO2: 18 mmol/L — ABNORMAL LOW (ref 22–32)
Calcium: 8 mg/dL — ABNORMAL LOW (ref 8.9–10.3)
Calcium: 8.4 mg/dL — ABNORMAL LOW (ref 8.9–10.3)
Chloride: 106 mmol/L (ref 98–111)
Chloride: 110 mmol/L (ref 98–111)
Creatinine, Ser: 2.69 mg/dL — ABNORMAL HIGH (ref 0.61–1.24)
Creatinine, Ser: 2.75 mg/dL — ABNORMAL HIGH (ref 0.61–1.24)
GFR, Estimated: 23 mL/min — ABNORMAL LOW (ref >60)
GFR, Estimated: 24 mL/min — ABNORMAL LOW (ref >60)
Glucose, Bld: 146 mg/dL — ABNORMAL HIGH (ref 70–99)
Glucose, Bld: 166 mg/dL — ABNORMAL HIGH (ref 70–99)
Potassium: 3.7 mmol/L (ref 3.5–5.1)
Potassium: 3.8 mmol/L (ref 3.5–5.1)
Sodium: 139 mmol/L (ref 135–145)
Sodium: 140 mmol/L (ref 135–145)

## 2023-03-04 LAB — CBC
HCT: 19.5 % — ABNORMAL LOW (ref 39.0–52.0)
HCT: 21.2 % — ABNORMAL LOW (ref 39.0–52.0)
Hemoglobin: 6.9 g/dL — CL (ref 13.0–17.0)
Hemoglobin: 7.5 g/dL — ABNORMAL LOW (ref 13.0–17.0)
MCH: 29.9 pg (ref 26.0–34.0)
MCH: 30.7 pg (ref 26.0–34.0)
MCHC: 35.4 g/dL (ref 30.0–36.0)
MCHC: 35.4 g/dL (ref 30.0–36.0)
MCV: 84.5 fL (ref 80.0–100.0)
MCV: 86.7 fL (ref 80.0–100.0)
Platelets: 158 10*3/uL (ref 150–400)
Platelets: 184 10*3/uL (ref 150–400)
RBC: 2.25 MIL/uL — ABNORMAL LOW (ref 4.22–5.81)
RBC: 2.51 MIL/uL — ABNORMAL LOW (ref 4.22–5.81)
RDW: 18.2 % — ABNORMAL HIGH (ref 11.5–15.5)
RDW: 18.6 % — ABNORMAL HIGH (ref 11.5–15.5)
WBC: 21.1 10*3/uL — ABNORMAL HIGH (ref 4.0–10.5)
WBC: 23.3 10*3/uL — ABNORMAL HIGH (ref 4.0–10.5)
nRBC: 0.1 % (ref 0.0–0.2)
nRBC: 0.2 % (ref 0.0–0.2)

## 2023-03-04 LAB — HEMOGLOBIN AND HEMATOCRIT, BLOOD
HCT: 27.3 % — ABNORMAL LOW (ref 39.0–52.0)
Hemoglobin: 9.1 g/dL — ABNORMAL LOW (ref 13.0–17.0)

## 2023-03-04 LAB — PREPARE RBC (CROSSMATCH)

## 2023-03-04 LAB — HEPARIN LEVEL (UNFRACTIONATED)
Heparin Unfractionated: 0.2 [IU]/mL — ABNORMAL LOW (ref 0.30–0.70)
Heparin Unfractionated: 0.33 [IU]/mL (ref 0.30–0.70)

## 2023-03-04 MED ORDER — PIPERACILLIN-TAZOBACTAM IN DEX 2-0.25 GM/50ML IV SOLN
2.2500 g | Freq: Three times a day (TID) | INTRAVENOUS | Status: DC
  Administered 2023-03-04 – 2023-03-05 (×3): 2.25 g via INTRAVENOUS
  Filled 2023-03-04 (×5): qty 50

## 2023-03-04 MED ORDER — HYDROMORPHONE HCL 1 MG/ML IJ SOLN
0.5000 mg | INTRAMUSCULAR | Status: DC | PRN
  Administered 2023-03-04 – 2023-03-06 (×4): 0.5 mg via INTRAVENOUS
  Filled 2023-03-04 (×5): qty 1

## 2023-03-04 MED ORDER — SODIUM CHLORIDE 0.9% IV SOLUTION: Freq: Once | INTRAVENOUS | Status: AC

## 2023-03-04 MED ORDER — HALOPERIDOL LACTATE 5 MG/ML IJ SOLN
1.0000 mg | Freq: Four times a day (QID) | INTRAMUSCULAR | Status: DC | PRN
  Administered 2023-03-09: 1 mg via INTRAMUSCULAR
  Filled 2023-03-04: qty 1

## 2023-03-04 NOTE — Plan of Care (Signed)
  Problem: Education: Goal: Understanding of cardiac disease, CV risk reduction, and recovery process will improve Outcome: Not Progressing Goal: Individualized Educational Video(s) Outcome: Not Progressing   Problem: Activity: Goal: Ability to tolerate increased activity will improve Outcome: Not Progressing  Patient is confuse

## 2023-03-04 NOTE — Progress Notes (Signed)
ANTICOAGULATION CONSULT NOTE Pharmacy Consult for Heparin  Indication: chest pain/ACS Brief A/P: Heparin level subtherapeutic Increase Heparin rate  Allergies  Allergen Reactions   Shellfish-Derived Products Anaphylaxis   Zolpidem Tartrate Other (See Comments)    Hallucinations    Patient Measurements: Height: 5\' 7"  (170.2 cm) Weight: 69.3 kg (152 lb 12.5 oz) IBW/kg (Calculated) : 66.1 Heparin DW = 68 kg  Vital Signs: Temp: 97.8 F (36.6 C) (08/23 2312) Temp Source: Axillary (08/23 2312) BP: 86/55 (08/23 2312) Pulse Rate: 88 (08/23 2312)  Labs: Recent Labs    03/01/23 1235 03/01/23 2328 03/02/23 0942 03/03/23 0418 03/03/23 2341  HGB  --   --  6.2* 8.2* 7.5*  HCT  --   --  17.9* 23.2* 21.2*  PLT  --   --  226 173 184  LABPROT  --   --   --  16.1*  --   INR  --   --   --  1.3*  --   HEPARINUNFRC 0.96* 0.59  --   --  0.20*  CREATININE  --   --  2.01* 2.49* 2.69*    Estimated Creatinine Clearance: 21.5 mL/min (A) (by C-G formula based on SCr of 2.69 mg/dL (H)).  Assessment: 77 y.o. male with NSTEMI and Afib for heparin  Goal of Therapy:  Heparin level 0.3-0.5 units/mL Monitor platelets by anticoagulation protocol: Yes   Plan:  Increase Heparin 1050 units/hr Check heparin level in 8 hours.   Geannie Risen, PharmD, BCPS

## 2023-03-04 NOTE — Progress Notes (Signed)
   Rounding Note    Patient Name: Nathan [name] Date of Encounter: 03/04/2023  [facility] Cardiologist: Donato Schultz, MD   Subjective   Hgb <7 this AM. No clear source of bleeding. Awake, taking medications this AM.  Vital Signs    Vitals:   03/03/23 2312 03/04/23 0408 03/04/23 0807 03/04/23 0955  BP: (!) 86/55 (!) 103/51 (!) 84/56 121/64  Pulse: 88 97 77 97  Resp: 20 18 19    Temp: 97.8 F (36.6 C) 97.6 F (36.4 C) (!) 97.5 F (36.4 C)   TempSrc: Axillary Axillary Axillary   SpO2: 100% 97% 94%   Weight:  59.4 kg    Height:        Intake/Output Summary (Last 24 hours) at 03/04/2023 1008 Last data filed at 03/04/2023 0418 Gross per 24 hour  Intake 10 ml  Output 605 ml  Net -595 ml      03/04/2023    4:08 AM 03/03/2023    4:19 AM 02/25/2023    6:42 PM  Last 3 Weights  Weight (lbs) 130 lb 15.3 oz 152 lb 12.5 oz 151 lb 7.3 oz  Weight (kg) 59.4 kg 69.3 kg 68.7 kg      Telemetry    AF - Personally Reviewed  ECG    Personally Reviewed  Physical Exam   GEN: No acute distress.   Cardiac: irregularly irregular, tachycardic.L inner thigh with hematoma. Soft. No other signs of bleeding. Respiratory: Clear to auscultation bilaterally.   Assessment & Plan    #NSTEMI #CAD s/p CABG in 2010 No heparin given drop in Hgb. I did send a msg to the hospitalist re: drop in Hgb and personally asked RN to stop Heparin gtt. Cont asa Cont statin Cont imdur Cont lopressor No invasive procedures planned  #Hypotensino #Anemia #R groin hematoma Hgb dropped to < 7. 1u PRBC ordered by primary. I did reach out to primary team re: new drp. Hold heparin.  #AF Hold heparin Cont lopressor  #Acute cholecystitis S/p perc drain Treatment per primary team.      Sheria Lang T. Lalla Brothers, MD, [facility], [facility] Cardiac Electrophysiology

## 2023-03-04 NOTE — Progress Notes (Signed)
PHARMACY NOTE:  ANTIMICROBIAL RENAL DOSAGE ADJUSTMENT  Current antimicrobial regimen includes a mismatch between antimicrobial dosage and estimated renal function.  As per policy approved by the Pharmacy & Therapeutics and Medical Executive Committees, the antimicrobial dosage will be adjusted accordingly.  Current antimicrobial dosage:  Zosyn 3.375 g q8h  Indication: intra-abdominal infection  Renal Function:  Estimated Creatinine Clearance: 19.3 mL/min (A) (by C-G formula based on SCr of 2.69 mg/dL (H)). []      On intermittent HD, scheduled: []      On CRRT    Antimicrobial dosage has been changed to: Zosyn 2.25 g q8h   Additional comments:   Thank you for allowing pharmacy to be a part of this patient's care.  Griffin Dakin, [facility] 03/04/2023 1:34 PM

## 2023-03-04 NOTE — IPAL (Signed)
I spoke with Mrs. Nathan [name], about her husband critical condition, cholecystitis, hypotension, delirium, and acute anemia. High risk of worsening. Considering patient's advance directives and poor prognosis in case of cardiac arrest, CODE status is changed to DNR, per his wife request.  She would like her husband to see his cat, will check if that is possible.

## 2023-03-04 NOTE — Progress Notes (Addendum)
Progress Note   Patient: Nathan [name] ZOX:[id number] DOB: [birth year] DOA: 02/20/2023     11 DOS: the patient was seen and examined on 03/04/2023   Brief hospital course: Nathan [name] was admitted to the hospital with the working diagnosis of NSTEMI.   77 y.o. M with DM, HTN, CAD s/p CABG and AVR, hx OSA, and history severe necrotizing fasciitis some years ago who presented with chest discomfort.  Constant left chest pressure, that prompted him to come to the hospital. On his initial physical examination his blood pressure was 121/60, HR 65, RR 18 and 02 saturation 100%, lungs with no wheezing or rales, heart with S1 and S2 present and regular with no gallops, abdomen with no distention and no lower extremity edema.   High sensitive troponin 12, 31, 4,527, 6,445    EKG 90 bpm, left axis deviation, normal intervals, sinus rhythm with old Q waves V1 to V3, with no ST segment changes, new negative T wave lead I and AvL.   He was placed on nitroglycerin drip for pain control and heparin drip for anticoagulation.  8/14 echo: EF 20-25% 8/20 echo: EF 25-30% with RWMA, no thrombus; AV repair noted  8/13: Admitted on heparin to Cardiology for NSTEMI --> evening of HD1, developed respiratory distress and rales requiring BiPAP and ICU transfer 8/14: Developed delirium requiring Precedex; Transaminitis noted 8/15: Cr peaks at 2.15 8/16: Weaned off Precedex; new onset Atrial fibrillation noted 8/17: Work up for leukocytosis started 8/18: Antibiotics started for Pneumonia based on CXR 8/20: Hgb down to 8s 8/21: ID consulted; CT C/A/P shows abnormal gallbladder, new hematoma; heparin stopped 8/22: IR and Gen Surg consulted; HIDA confirms cholecystitis; transfused 2 units; Perc chole drain placed 8/23: Worsening agitation/delirium  08/24 further droop in Hgb down to 6,9, hypotension. Stopped heparin drip and ordered PRBC transfusion x1.   Assessment and Plan: * NSTEMI (non-ST elevated myocardial  infarction) (HCC) Coronary artery disease sp CABG 2010 Echocardiogram with new reduction on LV systolic function, to 20 to 25%, global hypokinesis.   Coronary angiography initially cancelled due to volume overload and delirium.  Currently no further invasive testing planned due to persistent delirium and development of renal failure.  Holding anticoagulation today due to worsening anemia.  Continue with aspirin, statin, isosorbide and metoprolol.  Not reported current chest pain.   Acute on chronic systolic CHF (congestive heart failure) (HCC) Echocardiogram with new reduction in LV systolic function 20 to 25%, with global hypokinesis, mild dilatated LV cavity, mild LVH, RV systolic function with moderate reduction, LA with moderate dilatation, mild TR.   Urine output is 300 cc Systolic blood pressure today 91, 84, 120 mmHg.   Limited medical therapy due to hypotension. Continue with metoprolol with holding parameters.   Acute hypoxemic respiratory failure due to acute cardiogenic pulmonary edema. Required non invasive mechanical ventilation.  02 saturation today is 94% on room air.   Acute metabolic encephalopathy Delirium due to cardiomyopathy and cholecystitis.  Continue as needed haloperidol and will add Iv hydromorphone for pain control.  Quetiapine 25 mg po daily.   Acute cholecystitis Not noted on admission.  Imaging 8/21 showed signs of gallstones and cholecystitis, confirmed with HIDA.  Perc tube placed 8/22 - Cotinue Zosyn  - Plan for 3 weeks antibiotics - Plan for 6 weeks drain, follow up in IR clinic - Drain flushes per IR - Consult ID and IR  Acute posthemorrhagic anemia 08/21 CT with incompletely imaged heterogeneous mixed hyper and hypodensity in the region of  the right adductor musculature measuring 9,0 x 8,6 cm,  likely hematoma.   08/22 2 units PRBC transfusion.   Today with hypotension and further drop in hgb down to 6,9  Plan to stop heparin drip  and transfuse 1 unit PRBC. Follow up Hgb and keep 8 or above.  If persistent anemia, will plan to repeat Ct right thigh to follow up on hematoma.   Acute kidney injury on CKD stage 3a, GFR 45-59 ml/min (HCC) AKI, hypokalemia.   Patient with poor oral intake due to acute metabolic encephalopathy. Renal function with serum cr at 2,69 with K at 3,7 and serum bicarbonate at 18. Na 140  Plan to check renal function today. PRBC transfusion today. Continue to hold on diuretic therapy. Follow up renal function in am. Avoid hypotension and nephrotoxic medications.   Dyslipidemia - Continue Lipitor  Paroxysmal atrial fibrillation (HCC) New onset.  CHA2DS2-Vasc 6 (age, HTN, CHF, DM, vascular disease).  Rate controlled at present - Continue metoprolol - Hold anticoagulation  S/P AVR         Subjective: Patient has been confused and agitated, continue with soft restrains bilateral wrists. No chest pain, but persistent back pain. Tender right thigh hematoma.   Physical Exam: Vitals:   03/04/23 1107 03/04/23 1114 03/04/23 1130 03/04/23 1145  BP:    110/69  Pulse:   (!) 51   Resp:   15   Temp:  (!) 96.8 F (36 C) (!) 97 F (36.1 C)   TempSrc:   Axillary   SpO2: 100%  97%   Weight:      Height:       Neurology awake and alert, confused and disorientated. Deconditioned and ill looking appearing, responds to simple questions and follows commands ENT with positive pallor, oral mucosa dry Cardiovascular with S1 and S2 present and tachycardic, irregularly irregular with no gallops Respiratory with no rales or wheezing, no rhonchi Abdomen with no distention  No lower extremity edema  Data Reviewed:    Family Communication: no family at the bedside   Disposition: Status is: Inpatient Remains inpatient appropriate because: renal failure, heart failure, acute anemia and encephalopathy   Planned Discharge Destination: Home      Author: Coralie Keens, MD 03/04/2023  11:50 AM  For on call review [email or web address].

## 2023-03-05 DIAGNOSIS — I214 Non-ST elevation (NSTEMI) myocardial infarction: Secondary | ICD-10-CM | POA: Diagnosis not present

## 2023-03-05 DIAGNOSIS — I5023 Acute on chronic systolic (congestive) heart failure: Secondary | ICD-10-CM | POA: Diagnosis not present

## 2023-03-05 DIAGNOSIS — G9341 Metabolic encephalopathy: Secondary | ICD-10-CM | POA: Diagnosis not present

## 2023-03-05 DIAGNOSIS — K810 Acute cholecystitis: Secondary | ICD-10-CM | POA: Diagnosis not present

## 2023-03-05 LAB — CULTURE, BLOOD (ROUTINE X 2)
Culture: NO GROWTH
Culture: NO GROWTH
Special Requests: ADEQUATE

## 2023-03-05 LAB — BPAM RBC
Blood Product Expiration Date: 202408282359
Blood Product Expiration Date: 202409062359
Blood Product Expiration Date: 202409082359
ISSUE DATE / TIME: 202408221754
ISSUE DATE / TIME: 202408222152
ISSUE DATE / TIME: 202408241137
Unit Type and Rh: 6200
Unit Type and Rh: 6200
Unit Type and Rh: 6200

## 2023-03-05 LAB — TYPE AND SCREEN
ABO/RH(D): A POS
Antibody Screen: NEGATIVE
Unit division: 0
Unit division: 0
Unit division: 0

## 2023-03-05 LAB — BASIC METABOLIC PANEL
Anion gap: 12 (ref 5–15)
BUN: 65 mg/dL — ABNORMAL HIGH (ref 8–23)
CO2: 19 mmol/L — ABNORMAL LOW (ref 22–32)
Calcium: 8.5 mg/dL — ABNORMAL LOW (ref 8.9–10.3)
Chloride: 108 mmol/L (ref 98–111)
Creatinine, Ser: 2.54 mg/dL — ABNORMAL HIGH (ref 0.61–1.24)
GFR, Estimated: 25 mL/min — ABNORMAL LOW (ref >60)
Glucose, Bld: 119 mg/dL — ABNORMAL HIGH (ref 70–99)
Potassium: 3.3 mmol/L — ABNORMAL LOW (ref 3.5–5.1)
Sodium: 139 mmol/L (ref 135–145)

## 2023-03-05 LAB — GLUCOSE, CAPILLARY
Glucose-Capillary: 119 mg/dL — ABNORMAL HIGH (ref 70–99)
Glucose-Capillary: 145 mg/dL — ABNORMAL HIGH (ref 70–99)
Glucose-Capillary: 161 mg/dL — ABNORMAL HIGH (ref 70–99)
Glucose-Capillary: 172 mg/dL — ABNORMAL HIGH (ref 70–99)
Glucose-Capillary: 176 mg/dL — ABNORMAL HIGH (ref 70–99)
Glucose-Capillary: 209 mg/dL — ABNORMAL HIGH (ref 70–99)
Glucose-Capillary: 212 mg/dL — ABNORMAL HIGH (ref 70–99)

## 2023-03-05 LAB — CBC
HCT: 25.4 % — ABNORMAL LOW (ref 39.0–52.0)
Hemoglobin: 8.8 g/dL — ABNORMAL LOW (ref 13.0–17.0)
MCH: 29.2 pg (ref 26.0–34.0)
MCHC: 34.6 g/dL (ref 30.0–36.0)
MCV: 84.4 fL (ref 80.0–100.0)
Platelets: 173 10*3/uL (ref 150–400)
RBC: 3.01 MIL/uL — ABNORMAL LOW (ref 4.22–5.81)
RDW: 18.6 % — ABNORMAL HIGH (ref 11.5–15.5)
WBC: 20.7 10*3/uL — ABNORMAL HIGH (ref 4.0–10.5)
nRBC: 0.3 % — ABNORMAL HIGH (ref 0.0–0.2)

## 2023-03-05 MED ORDER — POTASSIUM CHLORIDE CRYS ER 20 MEQ PO TBCR
40.0000 meq | EXTENDED_RELEASE_TABLET | Freq: Once | ORAL | Status: AC
  Administered 2023-03-05: 40 meq via ORAL
  Filled 2023-03-05: qty 2

## 2023-03-05 MED ORDER — SODIUM CHLORIDE 0.9 % IV SOLN
3.0000 g | Freq: Two times a day (BID) | INTRAVENOUS | Status: DC
  Administered 2023-03-05 – 2023-03-07 (×4): 3 g via INTRAVENOUS
  Filled 2023-03-05 (×4): qty 8

## 2023-03-05 NOTE — Plan of Care (Signed)
  Problem: Activity: Goal: Ability to tolerate increased activity will improve Outcome: Not Progressing   Problem: Cardiac: Goal: Ability to achieve and maintain adequate cardiovascular perfusion will improve Outcome: Not Progressing   Problem: Health Behavior/Discharge Planning: Goal: Ability to safely manage health-related needs after discharge will improve Outcome: Not Progressing  Patient is still confuse

## 2023-03-05 NOTE — Progress Notes (Signed)
Patient noted to have bleeding from urethra.  Pt thinks he may have gotten penis stuck under bedside commode while using bathroom. Patient does not have any scratches or redness or open areas on penis only scant bleeding from urethra. MD notified.

## 2023-03-05 NOTE — Progress Notes (Signed)
   Rounding Note    Patient Name: Nathan [name] Date of Encounter: 03/05/2023  [facility] Cardiologist: Donato Schultz, MD   Subjective   Received 1 unit PRBC.  Appropriate hemoglobin rise.  Vital Signs    Vitals:   03/04/23 1937 03/05/23 0038 03/05/23 0339 03/05/23 0814  BP: (!) 113/58 119/63 130/87 131/70  Pulse: 73 79 96 90  Resp: 18 (!) 24 (!) 22 (!) 22  Temp:  98.2 F (36.8 C) (!) 97.5 F (36.4 C) 98.2 F (36.8 C)  TempSrc:  Oral Oral Oral  SpO2: 95% 96% 92% 99%  Weight:   65.4 kg   Height:        Intake/Output Summary (Last 24 hours) at 03/05/2023 1004 Last data filed at 03/05/2023 0530 Gross per 24 hour  Intake 40 ml  Output 1325 ml  Net -1285 ml      03/05/2023    3:39 AM 03/04/2023    4:08 AM 03/03/2023    4:19 AM  Last 3 Weights  Weight (lbs) 144 lb 2.9 oz 130 lb 15.3 oz 152 lb 12.5 oz  Weight (kg) 65.4 kg 59.4 kg 69.3 kg      Telemetry    AF - Personally Reviewed  ECG    Personally Reviewed  Physical Exam   GEN: No acute distress.   Cardiac: irregularly irregular, tachycardic.L inner thigh with hematoma. Soft. No other signs of bleeding. Respiratory: Clear to auscultation bilaterally.   Assessment & Plan    #NSTEMI #CAD s/p CABG in 2010 Cont asa Cont statin Cont imdur Cont lopressor No invasive procedures planned given anemia.  #Hypotensino #Anemia #R groin hematoma Hgb dropped to < 7.  Hold heparin.  #AF Hold heparin Cont lopressor.  Rate controlled.  #Acute cholecystitis S/p perc drain Treatment per primary team.  He is now DNR.    Sheria Lang T. Lalla Brothers, MD, [facility], [facility] Cardiac Electrophysiology

## 2023-03-05 NOTE — Progress Notes (Signed)
Pharmacy Antibiotic Note  Nathan [name] is a 77 y.o. male admitted on 02/20/2023 with  intra-abdominal infection .  Pt was previously on Zosyn per ID, now being switched to Unasyn. Can go out on Augmentin once stable for discharge. Pharmacy has been consulted for Unasyn  dosing.  WBC 20.7, afebrile, Scr 2.54.   Plan: Start Unasyn 3 g IV every 12 hours Monitor WBC, temperature, renal function, and clinical picture    Height: 5\' 7"  (170.2 cm) Weight: 65.4 kg (144 lb 2.9 oz) IBW/kg (Calculated) : 66.1  Temp (24hrs), Avg:97.7 F (36.5 C), Min:97.2 F (36.2 C), Max:98.2 F (36.8 C)  Recent Labs  Lab 02/28/23 0934 02/28/23 1644 02/28/23 1830 03/01/23 0248 03/01/23 1235 03/01/23 1556 03/02/23 0942 03/03/23 0418 03/03/23 2341 03/04/23 0746 03/05/23 0341  WBC  --  20.8*  --    < >  --   --  20.5* 25.7* 23.3* 21.1* 20.7*  CREATININE  --  1.96*  --    < >  --   --  2.01* 2.49* 2.69* 2.75* 2.54*  LATICACIDVEN 1.3 1.5 2.1*  --  1.5 1.6  --   --   --   --   --    < > = values in this interval not displayed.    Estimated Creatinine Clearance: 22.5 mL/min (A) (by C-G formula based on SCr of 2.54 mg/dL (H)).    Allergies  Allergen Reactions   Shellfish-Derived Products Anaphylaxis   Zolpidem Tartrate Other (See Comments)    Hallucinations    Antimicrobials this admission: Zosyn 8/23  >> 8/25 Unasyn 8/25 >>   Microbiology results: 8/22 Wound cx: E. Coli (I-amp)   Thank you for allowing pharmacy to be a part of this patient's care.  Griffin Dakin 03/05/2023 1:38 PM

## 2023-03-05 NOTE — Progress Notes (Signed)
         Date: 03/05/2023  Patient name: Nathan [name]  Medical record number: [id number]  Date of birth: 1945/07/12   E coli I to AMP but S to AMP/SUL  I have changed him from Zosyn to Unasyn.  When he is ready to discharge would send him out on Augmentin 500 mg/125mg  PO BID  Would ensure he has a 30 day supply via [facility] pharmacy  He has a follow-up with Dr. Thedore Mins.  I will sign off for now please call with further questions.  Acey Lav 03/05/2023, 1:34 PM

## 2023-03-05 NOTE — Progress Notes (Signed)
Progress Note   Patient: Nathan [name] ZOX:[id number] DOB: Jul 15, 1945 DOA: 02/20/2023     12 DOS: the patient was seen and examined on 03/05/2023   Brief hospital course: Nathan [name] was admitted to the hospital with the working diagnosis of NSTEMI.   77 y.o. M with DM, HTN, CAD s/p CABG and AVR, hx OSA, and history severe necrotizing fasciitis some years ago who presented with chest discomfort.  Constant left chest pressure, that prompted him to come to the hospital. On his initial physical examination his blood pressure was 121/60, HR 65, RR 18 and 02 saturation 100%, lungs with no wheezing or rales, heart with S1 and S2 present and regular with no gallops, abdomen with no distention and no lower extremity edema.   High sensitive troponin 12, 31, 4,527, 6,445    EKG 90 bpm, left axis deviation, normal intervals, sinus rhythm with old Q waves V1 to V3, with no ST segment changes, new negative T wave lead I and AvL.   He was placed on nitroglycerin drip for pain control and heparin drip for anticoagulation.  8/14 echo: EF 20-25% 8/20 echo: EF 25-30% with RWMA, no thrombus; AV repair noted  8/13: Admitted on heparin to Cardiology for NSTEMI --> evening of HD1, developed respiratory distress and rales requiring BiPAP and ICU transfer 8/14: Developed delirium requiring Precedex; Transaminitis noted 8/15: Cr peaks at 2.15 8/16: Weaned off Precedex; new onset Atrial fibrillation noted 8/17: Work up for leukocytosis started 8/18: Antibiotics started for Pneumonia based on CXR 8/20: Hgb down to 8s 8/21: ID consulted; CT C/A/P shows abnormal gallbladder, new hematoma; heparin stopped 8/22: IR and Gen Surg consulted; HIDA confirms cholecystitis; transfused 2 units; Perc chole drain placed 8/23: Worsening agitation/delirium  08/24 further droop in Hgb down to 6,9, hypotension. Stopped heparin drip and ordered PRBC transfusion x1.  08/25 mentation has improved but not back to baseline, continue  to use intermittently soft wrist restrains.   Assessment and Plan: * NSTEMI (non-ST elevated myocardial infarction) (HCC) Coronary artery disease sp CABG 2010 Echocardiogram with new reduction on LV systolic function, to 20 to 25%, global hypokinesis.   Coronary angiography initially cancelled due to volume overload and delirium.  Currently no further invasive testing planned due to persistent delirium and development of renal failure.  Holding anticoagulation today due to worsening anemia.  Continue with aspirin, statin, isosorbide and metoprolol.  Not reported current chest pain.   Acute on chronic systolic CHF (congestive heart failure) (HCC) Echocardiogram with new reduction in LV systolic function 20 to 25%, with global hypokinesis, mild dilatated LV cavity, mild LVH, RV systolic function with moderate reduction, LA with moderate dilatation, mild TR.   Urine output is 900 cc Systolic blood pressure 120 to 130 mmHg.   Limited medical therapy due to hypotension. Continue with metoprolol with holding parameters.   Acute hypoxemic respiratory failure due to acute cardiogenic pulmonary edema. Required non invasive mechanical ventilation on admission.  02 saturation today is 99% on room air.   Acute metabolic encephalopathy Delirium due to cardiomyopathy and cholecystitis.  Continue as needed haloperidol and will add Iv hydromorphone for pain control.  Quetiapine 25 mg po daily.  Mentation is improving, but not yet back to baseline, continue to use intermittently soft wrist restrains.   Acute cholecystitis Not noted on admission.  Imaging 8/21 showed signs of gallstones and cholecystitis, confirmed with HIDA.  Perc tube placed 8/22 - Plan for 6 weeks drain, follow up in IR clinic - Drain flushes  per IR Antibiotic therapy changed to Unasyn IV Wbc today is 20,7 trending down from 21.1   Acute posthemorrhagic anemia 08/21 CT with incompletely imaged heterogeneous mixed hyper  and hypodensity in the region of the right adductor musculature measuring 9,0 x 8,6 cm,  likely hematoma.   08/22 2 units PRBC transfusion.  08/24 1 unit PRBC transfusion.   Follow up Hgb is 8.8  Continue to hold on IV heparin.   Acute kidney injury on CKD stage 3a, GFR 45-59 ml/min (HCC) AKI, hypokalemia.   Renal function with serum cr at 2.54 with K at 3.3 and serum bicarbonate at 19. Na 139.   Continue K correction with Kcl 40 meq.  Continue to hold on diuretic therapy. Follow up renal function in am. Avoid hypotension and nephrotoxic medications.   Dyslipidemia - Continue Lipitor  Paroxysmal atrial fibrillation (HCC) New onset.  CHA2DS2-Vasc 6 (age, HTN, CHF, DM, vascular disease).  Rate controlled at present - Continue metoprolol - Hold anticoagulation  S/P AVR          Subjective: Patient with improvement in his mentation, but not back to baseline, continue to have confusion and disorientation.   Physical Exam: Vitals:   03/05/23 0038 03/05/23 0339 03/05/23 0814 03/05/23 1205  BP: 119/63 130/87 131/70 (!) 111/56  Pulse: 79 96 90   Resp: (!) 24 (!) 22 (!) 22   Temp: 98.2 F (36.8 C) (!) 97.5 F (36.4 C) 98.2 F (36.8 C) 97.7 F (36.5 C)  TempSrc: Oral Oral Oral Axillary  SpO2: 96% 92% 99%   Weight:  65.4 kg    Height:       Neurology awake and alert, confused and disorientated, able to follow commands and answer simple questions.  ENT with mild pallor Cardiovascular with S1 and S2 present, irregularly irregular with no gallops, rubs or murmurs No JVD No lower extremity edema Respiratory with no rales or wheezing, no rhonchi Abdomen with no distention  Data Reviewed:    Family Communication: no family at the bedside at the time of my examination   Disposition: Status is: Inpatient Remains inpatient appropriate because: IV antibiotic therapy, recovering heart failure and cholecystitis.   Planned Discharge Destination:  Home     Author: Coralie Keens, MD 03/05/2023 3:57 PM  For on call review [email or web address].

## 2023-03-06 ENCOUNTER — Inpatient Hospital Stay (HOSPITAL_COMMUNITY): Payer: Medicare Other

## 2023-03-06 DIAGNOSIS — I214 Non-ST elevation (NSTEMI) myocardial infarction: Secondary | ICD-10-CM

## 2023-03-06 DIAGNOSIS — G9341 Metabolic encephalopathy: Secondary | ICD-10-CM | POA: Diagnosis not present

## 2023-03-06 DIAGNOSIS — N179 Acute kidney failure, unspecified: Secondary | ICD-10-CM | POA: Diagnosis not present

## 2023-03-06 DIAGNOSIS — K810 Acute cholecystitis: Secondary | ICD-10-CM | POA: Diagnosis not present

## 2023-03-06 DIAGNOSIS — I5023 Acute on chronic systolic (congestive) heart failure: Secondary | ICD-10-CM | POA: Diagnosis not present

## 2023-03-06 LAB — BASIC METABOLIC PANEL
Anion gap: 10 (ref 5–15)
BUN: 52 mg/dL — ABNORMAL HIGH (ref 8–23)
CO2: 19 mmol/L — ABNORMAL LOW (ref 22–32)
Calcium: 8.4 mg/dL — ABNORMAL LOW (ref 8.9–10.3)
Chloride: 112 mmol/L — ABNORMAL HIGH (ref 98–111)
Creatinine, Ser: 2.05 mg/dL — ABNORMAL HIGH (ref 0.61–1.24)
GFR, Estimated: 33 mL/min — ABNORMAL LOW (ref >60)
Glucose, Bld: 146 mg/dL — ABNORMAL HIGH (ref 70–99)
Potassium: 4.2 mmol/L (ref 3.5–5.1)
Sodium: 141 mmol/L (ref 135–145)

## 2023-03-06 LAB — CBC
HCT: 24.5 % — ABNORMAL LOW (ref 39.0–52.0)
Hemoglobin: 8.2 g/dL — ABNORMAL LOW (ref 13.0–17.0)
MCH: 29.5 pg (ref 26.0–34.0)
MCHC: 33.5 g/dL (ref 30.0–36.0)
MCV: 88.1 fL (ref 80.0–100.0)
Platelets: 177 10*3/uL (ref 150–400)
RBC: 2.78 MIL/uL — ABNORMAL LOW (ref 4.22–5.81)
RDW: 20 % — ABNORMAL HIGH (ref 11.5–15.5)
WBC: 15.2 10*3/uL — ABNORMAL HIGH (ref 4.0–10.5)
nRBC: 0.7 % — ABNORMAL HIGH (ref 0.0–0.2)

## 2023-03-06 LAB — GLUCOSE, CAPILLARY
Glucose-Capillary: 130 mg/dL — ABNORMAL HIGH (ref 70–99)
Glucose-Capillary: 141 mg/dL — ABNORMAL HIGH (ref 70–99)
Glucose-Capillary: 156 mg/dL — ABNORMAL HIGH (ref 70–99)
Glucose-Capillary: 158 mg/dL — ABNORMAL HIGH (ref 70–99)
Glucose-Capillary: 158 mg/dL — ABNORMAL HIGH (ref 70–99)
Glucose-Capillary: 250 mg/dL — ABNORMAL HIGH (ref 70–99)

## 2023-03-06 LAB — ECHOCARDIOGRAM LIMITED
Height: 67 in
S' Lateral: 3.7 cm
Weight: 2373.91 [oz_av]

## 2023-03-06 MED ORDER — PERFLUTREN LIPID MICROSPHERE
1.0000 mL | INTRAVENOUS | Status: AC | PRN
  Administered 2023-03-06: 2 mL via INTRAVENOUS

## 2023-03-06 MED ORDER — INSULIN ASPART 100 UNIT/ML IJ SOLN
0.0000 [IU] | Freq: Three times a day (TID) | INTRAMUSCULAR | Status: DC
  Administered 2023-03-06: 3 [IU] via SUBCUTANEOUS
  Administered 2023-03-07 (×3): 2 [IU] via SUBCUTANEOUS
  Administered 2023-03-08: 3 [IU] via SUBCUTANEOUS
  Administered 2023-03-08 – 2023-03-09 (×3): 2 [IU] via SUBCUTANEOUS
  Administered 2023-03-09: 3 [IU] via SUBCUTANEOUS
  Administered 2023-03-09: 1 [IU] via SUBCUTANEOUS
  Administered 2023-03-10: 2 [IU] via SUBCUTANEOUS
  Administered 2023-03-10 (×2): 1 [IU] via SUBCUTANEOUS
  Administered 2023-03-11: 2 [IU] via SUBCUTANEOUS
  Administered 2023-03-11: 1 [IU] via SUBCUTANEOUS
  Administered 2023-03-11 – 2023-03-13 (×4): 3 [IU] via SUBCUTANEOUS
  Administered 2023-03-13: 2 [IU] via SUBCUTANEOUS

## 2023-03-06 NOTE — Progress Notes (Signed)
Called to bedside by NT who responded to bed alarm.  Patient was out of bed, sitting in a chair and had removed telemetry and was putting on clothing from home.  He has been oriented to self and place only since the beginning of this shift, but has been compliant with commands despite very poor safety awareness and poor judgment.  This has not changed; however, currently exhibits very disorganized thinking and delusions (stated that he is planning to move his family somewhere safe in [place] away from rioters that are "going crazy about the politics").  Noted that patient had also completely separated his biliary drain from the Luer-lock system port.  This cannot be reassembled and was steadily dripping bile on the floor.  This RN cleared the spilled fluids and wrapped the end of the drain with a towel.  Paged Dr. Antionette Char to make aware and reported to RN for night shift.

## 2023-03-06 NOTE — Progress Notes (Signed)
Physical Therapy Treatment Patient Details Name: Nathan [name] MRN: [id number] DOB: 09-17-1945 Today's Date: 03/06/2023   History of Present Illness Pt is 77 yo male presented on 02/20/23 with chest pain and found to have NSTEMI.  Pt was scheduled to undergo cardiac cath but developed overload/CHF requiring transfer to ICU. Pt found to be septic with cholecystitis with MD placing chole drain on 8/22.  Cardiac cath will be postponed until sepsis improved.  PMH: CAD s/p CABGx3 in 2010, aortic valve replacement, DM, HTN, HLD, MI, OSA    PT Comments  Pt admitted with above diagnosis. Pt was able to ambulate incr distance today with RW since last session limited by confusion. Pt did have an episode in the hallway where nurse had to bring chair to pt and he sat down as his HR was up to 121 bpm and nurse stated he may have been in afib.  Once sitting, HR to 84 bpm.  Rolled pt to room and settled in bed with nurse by bedside.  Wife  present and wants to take the pt home if she can and was questioning if pt needs SNF.  Discussed with wife that pt did better today and if she feels confident that he would probably be best off at home with HHPT.  Plan to practice steps in am as pt has about 10 to get into house. Will follow acutely. Pt currently with functional limitations due to the deficits listed below (see PT Problem List). Pt will benefit from acute skilled PT to increase their independence and safety with mobility to allow discharge.       If plan is discharge home, recommend the following: A little help with walking and/or transfers;A little help with bathing/dressing/bathroom;Assistance with cooking/housework;Help with stairs or ramp for entrance   Can travel by private vehicle        Equipment Recommendations  Rolling walker (2 wheels);Other (comment) (issued gait belt)    Recommendations for Other Services       Precautions / Restrictions Precautions Precautions: Fall Precaution Comments:  Chole drain right side Restrictions Weight Bearing Restrictions: No     Mobility  Bed Mobility Overal bed mobility: Needs Assistance Bed Mobility: Supine to Sit     Supine to sit: Min assist Sit to supine: Min assist   General bed mobility comments: LE Assist needed to come to EOB and to lie back down.  Pt with difficulty processing commands.    Transfers Overall transfer level: Needs assistance Equipment used: Rolling walker (2 wheels) Transfers: Sit to/from Stand Sit to Stand: Min assist, From elevated surface           General transfer comment: Needed assist to stand to RW and cues for hand placement. Pt rocking and used momentum to stand.  Needed less assist to stand today than last visit with continued  intiial posterior lean.    Ambulation/Gait Ambulation/Gait assistance: Mod assist Gait Distance (Feet): 145 Feet Assistive device: Rolling walker (2 wheels) Gait Pattern/deviations: Step-through pattern, Decreased stride length, Trunk flexed, Wide base of support, Drifts right/left, Leaning posteriorly Gait velocity: decreased Gait velocity interpretation: <1.31 ft/sec, indicative of household ambulator   General Gait Details: Initially, pt was Min assist  to ambulate with RW for safety as pt tends to need cues to stay close to RW and for upright stance as he leans forward as he fatigues. Pt HR initially 81 bpm and incr to 121 bpm in hallway and pt stopped and stated that he felt weak.  Pt with LEs starting to flex and nurse in hallway brought chair behind pt to sit.  Nurse stated pt may have gone into afib for a few minutes.  Rolled pt back to the bed and took VS as well as helped him back to bed.  VSS.  Nurse in room.  Pt continues to need cues as he moves impulsively at times.   Stairs             Wheelchair Mobility     Tilt Bed    Modified Rankin (Stroke Patients Only)       Balance Overall balance assessment: Needs assistance Sitting-balance  support: No upper extremity supported Sitting balance-Leahy Scale: Good     Standing balance support: Bilateral upper extremity supported, Reliant on assistive device for balance Standing balance-Leahy Scale: Poor Standing balance comment: Requiring RW and min assist static stance, mod assist dynamically                            Cognition Arousal: Alert Behavior During Therapy: Flat affect Overall Cognitive Status: No family/caregiver present to determine baseline cognitive functioning Area of Impairment: Problem solving                             Problem Solving: Slow processing General Comments: Pt was oriented to person, place, time, and situation.        Exercises General Exercises - Lower Extremity Long Arc Quad: AROM, Both, 10 reps, Seated Hip Flexion/Marching: AROM, Both, 10 reps, Seated    General Comments        Pertinent Vitals/Pain Pain Assessment Pain Assessment: Faces Faces Pain Scale: Hurts little more Breathing: normal Negative Vocalization: occasional moan/groan, low speech, negative/disapproving quality Facial Expression: sad, frightened, frown Body Language: relaxed Consolability: no need to console PAINAD Score: 2 Pain Location: Bil LE Pain Descriptors / Indicators: Discomfort, Grimacing Pain Intervention(s): Monitored during session, Limited activity within patient's tolerance, Repositioned    Home Living                          Prior Function            PT Goals (current goals can now be found in the care plan section) Acute Rehab PT Goals Patient Stated Goal: return home Progress towards PT goals: Progressing toward goals    Frequency    Min 1X/week      PT Plan      Co-evaluation              AM-PAC PT "6 Clicks" Mobility   Outcome Measure  Help needed turning from your back to your side while in a flat bed without using bedrails?: A Little Help needed moving from lying on your  back to sitting on the side of a flat bed without using bedrails?: A Little Help needed moving to and from a bed to a chair (including a wheelchair)?: A Little Help needed standing up from a chair using your arms (e.g., wheelchair or bedside chair)?: A Little Help needed to walk in hospital room?: A Lot Help needed climbing 3-5 steps with a railing? : A Lot 6 Click Score: 16    End of Session Equipment Utilized During Treatment: Gait belt Activity Tolerance: Patient limited by fatigue Patient left: with call bell/phone within reach;in bed;with bed alarm set;with family/visitor present Nurse Communication: Mobility status PT Visit  Diagnosis: Other abnormalities of gait and mobility (R26.89);Muscle weakness (generalized) (M62.81)     Time: 4098-1191 PT Time Calculation (min) (ACUTE ONLY): 27 min  Charges:    $Gait Training: 23-37 mins PT General Charges $$ ACUTE PT VISIT: 1 Visit                     Saori Umholtz M,PT Acute Rehab Services [phone]    Bevelyn Buckles 03/06/2023, 3:54 PM

## 2023-03-06 NOTE — Progress Notes (Signed)
Progress Note   Patient: Nathan [name] OZH:[id number] DOB: 1946/06/01 DOA: 02/20/2023     13 DOS: the patient was seen and examined on 03/06/2023   Brief hospital course: Nathan [name] was admitted to the hospital with the working diagnosis of NSTEMI.   77 y.o. M with DM, HTN, CAD s/p CABG and AVR, hx OSA, and history severe necrotizing fasciitis some years ago who presented with chest discomfort.  Constant left chest pressure, that prompted him to come to the hospital. On his initial physical examination his blood pressure was 121/60, HR 65, RR 18 and 02 saturation 100%, lungs with no wheezing or rales, heart with S1 and S2 present and regular with no gallops, abdomen with no distention and no lower extremity edema.   High sensitive troponin 12, 31, 4,527, 6,445    EKG 90 bpm, left axis deviation, normal intervals, sinus rhythm with old Q waves V1 to V3, with no ST segment changes, new negative T wave lead I and AvL.   He was placed on nitroglycerin drip for pain control and heparin drip for anticoagulation.  8/14 echo: EF 20-25% 8/20 echo: EF 25-30% with RWMA, no thrombus; AV repair noted  8/13: Admitted on heparin to Cardiology for NSTEMI --> evening of HD1, developed respiratory distress and rales requiring BiPAP and ICU transfer 8/14: Developed delirium requiring Precedex; Transaminitis noted 8/15: Cr peaks at 2.15 8/16: Weaned off Precedex; new onset Atrial fibrillation noted 8/17: Work up for leukocytosis started 8/18: Antibiotics started for Pneumonia based on CXR 8/20: Hgb down to 8s 8/21: ID consulted; CT C/A/P shows abnormal gallbladder, new hematoma; heparin stopped 8/22: IR and Gen Surg consulted; HIDA confirms cholecystitis; transfused 2 units; Perc chole drain placed 8/23: Worsening agitation/delirium  08/24 further droop in Hgb down to 6,9, hypotension. Stopped heparin drip and ordered PRBC transfusion x1.  08/25 mentation has improved but not back to baseline, continue  to use intermittently soft wrist restrains.  08/26 encephalopathy slowly improving, today off restrains and improved po intake.   Assessment and Plan: * NSTEMI (non-ST elevated myocardial infarction) (HCC) Coronary artery disease sp CABG 2010 Echocardiogram with new reduction on LV systolic function, to 20 to 25%, global hypokinesis.   Coronary angiography initially cancelled due to volume overload and delirium.  Currently no further invasive testing planned due to persistent delirium and development of renal failure.  Holding anticoagulation today due to worsening anemia.  Continue with aspirin, statin, isosorbide and metoprolol.  Not reported current chest pain.   Acute on chronic systolic CHF (congestive heart failure) (HCC) Echocardiogram with new reduction in LV systolic function 20 to 25%, with global hypokinesis, mild dilatated LV cavity, mild LVH, RV systolic function with moderate reduction, LA with moderate dilatation, mild TR.   Volume balance is negative, -2,362 ml, since admission.  Systolic blood pressure 99 to 629 mmHg.   Limited medical therapy due to hypotension. Continue with metoprolol with holding parameters.   Acute hypoxemic respiratory failure due to acute cardiogenic pulmonary edema. Required non invasive mechanical ventilation on admission.  02 saturation is 99% on room air.   Acute metabolic encephalopathy Delirium due to cardiomyopathy and cholecystitis.  Continue as needed haloperidol and IV hydromorphone for pain control.  Quetiapine 25 mg po daily.  Today off restrains.   Acute cholecystitis Not noted on admission.  Imaging 8/21 showed signs of gallstones and cholecystitis, confirmed with HIDA.  Perc tube placed 8/22 - Plan for 6 weeks drain, follow up in IR clinic - Drain flushes  per IR Antibiotic therapy changed to Unasyn IV Wbc today is 15.2   Acute posthemorrhagic anemia 08/21 CT with incompletely imaged heterogeneous mixed hyper and  hypodensity in the region of the right adductor musculature measuring 9,0 x 8,6 cm,  likely hematoma.   08/22 2 units PRBC transfusion.  08/24 1 unit PRBC transfusion.   Follow up Hgb is 8.2   Acute kidney injury on CKD stage 3a, GFR 45-59 ml/min (HCC) AKI, hypokalemia.   Renal function with serum cr at 2,0 with K at 4,2 and serum bicarbonate at 19. Na 141.  Follow up renal function in am.  Will need diuretic therapy at the time of discharge.   Dyslipidemia Continue with statin therapy.   Paroxysmal atrial fibrillation (HCC) New onset.  CHA2DS2-Vasc 6 (age, HTN, CHF, DM, vascular disease).  Continue rate control with metoprolol, holding anticoagulation due to acute bleed (left thigh hematoma)   S/P AVR          Subjective: Patient with no chest pain, or dyspnea, his confusion has improved but not back to baseline, he is off restrains and his wife was at the bedside at the time of my visit.   Physical Exam: Vitals:   03/05/23 1706 03/05/23 2009 03/06/23 0426 03/06/23 1207  BP: 120/80 123/68 (!) 99/53 117/71  Pulse:  80 95 95  Resp: 16 20 19 12   Temp:  97.7 F (36.5 C) 97.7 F (36.5 C) (!) 97.5 F (36.4 C)  TempSrc:  Oral Oral Oral  SpO2:  96% 100% 96%  Weight:   67.3 kg   Height:       Neurology awake and alert, deconditioned, follows commands and responds to simple questions Mild pallor Cardiovascular with S1 and S2 present irregularly irregular with no gallops, rubs or murmurs No JVD No lower extremity edema Respiratory with no rales or wheezing, Abdomen with no distention  Data Reviewed:    Family Communication: I spoke with patient's wife at the bedside, we talked in detail about patient's condition, plan of care and prognosis and all questions were addressed.   Disposition: Status is: Inpatient Remains inpatient appropriate because: recovering from acute metabolic encephalopathy, with delirium.   Planned Discharge Destination:  to be determined      Author: Coralie Keens, MD 03/06/2023 2:20 PM  For on call review [email or web address].

## 2023-03-06 NOTE — Progress Notes (Addendum)
Patient Name: Nathan [name] Date of Encounter: 03/06/2023 [facility] Cardiologist: Donato Schultz, MD   Interval Summary  .    Patient currently unhappy.  Stated that he is tired of this place and cannot last another week.  He did state that he wanted a gun to shoot himself.  Also has history of some delirium needing Precedex here.  This has been relayed to primary team.  Further details done below.  He states that he has diffuse pain  Vital Signs .    Vitals:   03/05/23 1622 03/05/23 1706 03/05/23 2009 03/06/23 0426  BP: (!) 119/103 120/80 123/68 (!) 99/53  Pulse: 83  80 95  Resp: (!) 31 16 20 19   Temp: 97.7 F (36.5 C)  97.7 F (36.5 C) 97.7 F (36.5 C)  TempSrc: Oral  Oral Oral  SpO2: 90%  96% 100%  Weight:    67.3 kg  Height:        Intake/Output Summary (Last 24 hours) at 03/06/2023 1152 Last data filed at 03/06/2023 8119 Gross per 24 hour  Intake 305 ml  Output 100 ml  Net 205 ml      03/06/2023    4:26 AM 03/05/2023    3:39 AM 03/04/2023    4:08 AM  Last 3 Weights  Weight (lbs) 148 lb 5.9 oz 144 lb 2.9 oz 130 lb 15.3 oz  Weight (kg) 67.3 kg 65.4 kg 59.4 kg      Telemetry/ECG    Atrial fibrillation heart rate 80s- Personally Reviewed   Cardiac Studies   TTE 02/24/2023: EF 25 to 30%.  Diffuse HK in the inferior, inferoseptal and apical walls as well as distal pole akinesis.  Cannot exclude mural thrombus.  Normal RV with severely elevated RAP of 15 mmHg..  Moderate to severe MAC with mild MR.  Status post AVR with 23 mm Edwards prosthesis.  Mean 8 PG 7 mmHg. TTE 02/28/2023: EF 25 to 30%. TTE 03/06/2023: EF now estimated 35 to 40% global HK.  Unable to assess endocardial borders to determine wall motion.  Unable to assess diastolic function.  Stable 23 mm Edwards prosthetic aortic valve with no notable AI or AAS.  RAP now 3 mmHg.  Physical Exam .    GEN: No acute distress.  Sitting up in bed somewhat confused (mention to PA that he wanted a gun to shoot  himself so he did not have to live like this) Neck: No visible JVD Cardiac: Irregularly irregular rhythm with normal rate, no murmurs, rubs, or gallops.  Respiratory: Clear to auscultation bilaterally. GI: Soft, nontender, non-distended  MS: No edema  Patient Profile    Nathan [name] is a 77 y.o. male has hx of   CAD s/p CABG x4 in 10/2008, severe aortic stenosis  s/p AVR in 10/2008 at time of CABG, hypertension, hyperlipidemia, type 2 diabetes mellitus, CKD stage III, GERD, and obstructive sleep apnea, severe necrotizing fasciitis and admitted on 02/20/2023 for the evaluation of NSTEMI.   Assessment & Plan .     NSTEMI  CAD status post CABG in 2010 Patient presenting with elevated troponins in the 6000's with newly reduced EF 20 to 25% with wall motion abnormalities in the inferior, inferoseptal, apical walls.  We have had plans for right and left heart catheterization however this has been delayed due to worsening renal function, altered mental status, procedures, etc. Still not a candidate currently. => At this rate, I do not see that we would need to consider  invasive evaluation prior to discharge unless as dramatic recovery of renal and neurologic function. Continue aspirin, statin Continue Imdur 30 mg, Lopressor 25 mg twice daily (this will need to be transition to Toprol-XL at discharge)  Acute HFrEF Strong suspicion for ischemic cardiomyopathy.  Overall euvolemic now.  GDMT significantly limited by renal dysfunction and hypotension.  Further titration to be done once renal function stabilizes. Follow-up echo today showed improved EF with less prominent regional wall motion abnormality.  Quite reassuring.  Also noted that RAP is better controlled indicating adequate diuresis.  Would also question the possibility of being some component of stress myopathy.  New onset atrial fibrillation Rate controlled with heart rates in the 80s.  Not able to anticoagulate due to unsteady  hemoglobin/bleed requiring blood transfusion.   Continue beta-blocker as above. Quite possible that his EF is improved with better rate control.  Severe aortic stenosis status post AVR in 2010 Echo this admission shows stable.  Mean gradient 7, peak gradient 14.  Continue to monitor outpatient.    Hyperlipidemia LDL 37.  Continue atorvastatin 80 mg  Altered mental status/encephalopathy Suicidal ideations Has been placed on delirium precautions requiring Precedex during this admission.  When evaluated today patient states that he wanted a gun to shoot himself because he cannot take another week of his condition.  Seems to also be confused during my evaluation.  Unclear whether this is a true representation of his mental state given significant concern for encephalopathy.  Event today has been relayed to primary team.  CKD with AKI Renal function on admission 1.28.  Has consistently been in the twos.  Starting to downtrend.  Currently 2.05.  Acute cholecystitis Status post PERC drain on 03/02/2023.  Management per primary team.  Hemorrhagic anemia CT on 03/01/2023 indicating mixed hyper/hypodensity region in the right adductor musculature suggesting hematoma.  Has required multiple transfusions.  Management per primary team.  Hemoglobin 8.2. 03/02/2023 had 2 units PRBC transfusion 03/04/2023 had 1 PRBC transfusion Spontaneous bleeding on anticoagulation is somewhat worrisome and therefore would probably benefit from avoiding long-term OAC for now until he stabilizes in the outpatient setting.      Signed, Abagail Kitchens, PA-C    ATTENDING ATTESTATION  I have seen, examined and evaluated the patient this morning on rounds along with Abagail Kitchens, PA-C.  After reviewing all the available data and chart, we discussed the patients laboratory, study & physical findings as well as symptoms in detail.  I agree with his findings, examination as well as impression recommendations as per our  discussion.    Attending adjustments noted in italics.   Slow recovery of neurologic and renal function but also now with echocardiogram notable improvement on EF.  Seems to be more euvolemic. For now no plans for invasive evaluation given renal insufficiency, bleeding/hemorrhage and altered mental status.  Holding off on either IV or oral anticoagulation at this point.  Would continue medical management for CAD and presumed ICM.  Not presently on diuretic because he seems to be more euvolemic. => Will need to consolidate beta-blocker on discharge.  Continue statin  Will follow along with no new recommendations.    Marykay Lex, MD, MS Bryan Lemma, M.D., M.S. Interventional Cardiologist  [facility]  Pager # [phone] Phone # [phone] [address] [place], [place] [address]     For questions or updates, please contact [facility] Please consult [email or web address] for contact info under

## 2023-03-06 NOTE — Progress Notes (Signed)
  Echocardiogram 2D Echocardiogram has been performed.  Milda Smart 03/06/2023, 11:51 AM

## 2023-03-07 ENCOUNTER — Inpatient Hospital Stay (HOSPITAL_COMMUNITY): Payer: Medicare Other

## 2023-03-07 DIAGNOSIS — G9341 Metabolic encephalopathy: Secondary | ICD-10-CM | POA: Diagnosis not present

## 2023-03-07 DIAGNOSIS — I214 Non-ST elevation (NSTEMI) myocardial infarction: Secondary | ICD-10-CM | POA: Diagnosis not present

## 2023-03-07 DIAGNOSIS — K810 Acute cholecystitis: Secondary | ICD-10-CM | POA: Diagnosis not present

## 2023-03-07 DIAGNOSIS — I5023 Acute on chronic systolic (congestive) heart failure: Secondary | ICD-10-CM | POA: Diagnosis not present

## 2023-03-07 HISTORY — PX: IR EXCHANGE BILIARY DRAIN: IMG6046

## 2023-03-07 LAB — HEMOGLOBIN AND HEMATOCRIT, BLOOD
HCT: 27.2 % — ABNORMAL LOW (ref 39.0–52.0)
Hemoglobin: 8.9 g/dL — ABNORMAL LOW (ref 13.0–17.0)

## 2023-03-07 LAB — RENAL FUNCTION PANEL
Albumin: 2 g/dL — ABNORMAL LOW (ref 3.5–5.0)
Anion gap: 12 (ref 5–15)
BUN: 40 mg/dL — ABNORMAL HIGH (ref 8–23)
CO2: 19 mmol/L — ABNORMAL LOW (ref 22–32)
Calcium: 8.6 mg/dL — ABNORMAL LOW (ref 8.9–10.3)
Chloride: 109 mmol/L (ref 98–111)
Creatinine, Ser: 1.77 mg/dL — ABNORMAL HIGH (ref 0.61–1.24)
GFR, Estimated: 39 mL/min — ABNORMAL LOW (ref >60)
Glucose, Bld: 213 mg/dL — ABNORMAL HIGH (ref 70–99)
Phosphorus: 2.5 mg/dL (ref 2.5–4.6)
Potassium: 3.9 mmol/L (ref 3.5–5.1)
Sodium: 140 mmol/L (ref 135–145)

## 2023-03-07 LAB — AEROBIC/ANAEROBIC CULTURE W GRAM STAIN (SURGICAL/DEEP WOUND): Gram Stain: NONE SEEN

## 2023-03-07 LAB — GLUCOSE, CAPILLARY
Glucose-Capillary: 148 mg/dL — ABNORMAL HIGH (ref 70–99)
Glucose-Capillary: 157 mg/dL — ABNORMAL HIGH (ref 70–99)
Glucose-Capillary: 163 mg/dL — ABNORMAL HIGH (ref 70–99)
Glucose-Capillary: 172 mg/dL — ABNORMAL HIGH (ref 70–99)
Glucose-Capillary: 184 mg/dL — ABNORMAL HIGH (ref 70–99)
Glucose-Capillary: 196 mg/dL — ABNORMAL HIGH (ref 70–99)

## 2023-03-07 MED ORDER — AMOXICILLIN-POT CLAVULANATE 875-125 MG PO TABS
1.0000 | ORAL_TABLET | Freq: Two times a day (BID) | ORAL | Status: DC
  Administered 2023-03-07 – 2023-03-13 (×12): 1 via ORAL
  Filled 2023-03-07 (×13): qty 1

## 2023-03-07 MED ORDER — LATANOPROST 0.005 % OP SOLN
1.0000 [drp] | Freq: Every day | OPHTHALMIC | Status: DC
  Administered 2023-03-07 – 2023-03-12 (×6): 1 [drp] via OPHTHALMIC
  Filled 2023-03-07 (×2): qty 2.5

## 2023-03-07 MED ORDER — LIDOCAINE-EPINEPHRINE 1 %-1:100000 IJ SOLN
INTRAMUSCULAR | Status: AC
  Filled 2023-03-07: qty 1

## 2023-03-07 MED ORDER — METOPROLOL SUCCINATE ER 50 MG PO TB24
50.0000 mg | ORAL_TABLET | Freq: Every day | ORAL | Status: DC
  Administered 2023-03-08 – 2023-03-13 (×6): 50 mg via ORAL
  Filled 2023-03-07 (×6): qty 1

## 2023-03-07 MED ORDER — IOHEXOL 300 MG/ML  SOLN
50.0000 mL | Freq: Once | INTRAMUSCULAR | Status: AC | PRN
  Administered 2023-03-07: 10 mL

## 2023-03-07 NOTE — Progress Notes (Addendum)
Progress Note   Patient: Nathan [name] EXB:[id number] DOB: 04-27-1946 DOA: 02/20/2023     14 DOS: the patient was seen and examined on 03/07/2023   Brief hospital course: [name] was admitted to the hospital with the working diagnosis of NSTEMI.   77 y.o. M with DM, HTN, CAD s/p CABG and AVR, hx OSA, and history severe necrotizing fasciitis some years ago who presented with chest discomfort.  Constant left chest pressure, that prompted him to come to the hospital. On his initial physical examination his blood pressure was 121/60, HR 65, RR 18 and 02 saturation 100%, lungs with no wheezing or rales, heart with S1 and S2 present and regular with no gallops, abdomen with no distention and no lower extremity edema.   High sensitive troponin 12, 31, 4,527, 6,445    EKG 90 bpm, left axis deviation, normal intervals, sinus rhythm with old Q waves V1 to V3, with no ST segment changes, new negative T wave lead I and AvL.   He was placed on nitroglycerin drip for pain control and heparin drip for anticoagulation.  8/14 echo: EF 20-25% 8/20 echo: EF 25-30% with RWMA, no thrombus; AV repair noted  8/13: Admitted on heparin to Cardiology for NSTEMI --> evening of HD1, developed respiratory distress and rales requiring BiPAP and ICU transfer 8/14: Developed delirium requiring Precedex; Transaminitis noted 8/15: Cr peaks at 2.15 8/16: Weaned off Precedex; new onset Atrial fibrillation noted 8/17: Work up for leukocytosis started 8/18: Antibiotics started for Pneumonia based on CXR 8/20: Hgb down to 8s 8/21: ID consulted; CT C/A/P shows abnormal gallbladder, new hematoma; heparin stopped 8/22: IR and Gen Surg consulted; HIDA confirms cholecystitis; transfused 2 units; Perc chole drain placed 8/23: Worsening agitation/delirium  08/24 further droop in Hgb down to 6,9, hypotension. Stopped heparin drip and ordered PRBC transfusion x1.  08/25 mentation has improved but not back to baseline, continue  to use intermittently soft wrist restrains.  08/26 encephalopathy slowly improving, today off restrains and improved po intake.  08/27 patient pulled his biliary drain and will need to be replaced per IR. Antibiotic therapy changed to oral Augmentin. Encephalopathy is improving, continue off restrains.   Assessment and Plan: * NSTEMI (non-ST elevated myocardial infarction) (HCC) Coronary artery disease sp CABG 2010 Echocardiogram with new reduction on LV systolic function, to 20 to 25%, global hypokinesis.   Coronary angiography initially cancelled due to volume overload and delirium.  Currently no further invasive testing planned due to persistent delirium and development of renal failure.  Holding anticoagulation today due to worsening anemia.  Continue with aspirin, statin, isosorbide and metoprolol.  Not reported current chest pain.   Acute on chronic systolic CHF (congestive heart failure) (HCC) Echocardiogram with new reduction in LV systolic function 20 to 25%, with global hypokinesis, mild dilatated LV cavity, mild LVH, RV systolic function with moderate reduction, LA with moderate dilatation, mild TR.   Volume balance is negative, -2,062 ml, since admission.  Systolic blood pressure 105 to 133 mmHg.   Limited medical therapy due to hypotension. Continue with metoprolol with holding parameters.   Acute hypoxemic respiratory failure due to acute cardiogenic pulmonary edema. Required non invasive mechanical ventilation on admission.  Currently 02 saturation is 100% on room air.   Acute metabolic encephalopathy Delirium due to cardiomyopathy and cholecystitis.  Continue as needed haloperidol and IV hydromorphone for pain control.  Quetiapine 25 mg po daily.  Today off restrains.  Mentation is improving.   Acute cholecystitis Not noted on  admission.  Imaging 8/21 showed signs of gallstones and cholecystitis, confirmed with HIDA.  Perc tube placed 8/22 Plan for 6 weeks  drain, follow up in IR clinic Today will transition to oral Augmentin, will need to complete 3 weeks total, and final duration pending repeat imaging.  Today drain pulled by patient, IR consulted to be replaced.    Acute posthemorrhagic anemia 08/21 CT with incompletely imaged heterogeneous mixed hyper and hypodensity in the region of the right adductor musculature measuring 9,0 x 8,6 cm,  likely hematoma.   08/22 2 units PRBC transfusion.  08/24 1 unit PRBC transfusion.   Follow up Hgb is 8.9   Acute kidney injury on CKD stage 3a, GFR 45-59 ml/min (HCC) AKI, hypokalemia.   Patient with improvement in volume status, renal function with serum cr at 1,7 with K at 3,9 and serum bicarbonate at 19. Na 140   Continue to hold on diuretic therapy, will need to resume at the time of his discharge.    Dyslipidemia Continue with statin therapy.   Paroxysmal atrial fibrillation (HCC) New onset.  CHA2DS2-Vasc 6 (age, HTN, CHF, DM, vascular disease).  Continue rate control with metoprolol, holding anticoagulation due to acute bleed (left thigh hematoma)   S/P AVR          Subjective: Patient with no agitation, he is off restrain, noted poor oral intake. No chest pain or dyspnea.   Physical Exam: Vitals:   03/07/23 0449 03/07/23 0827 03/07/23 1127 03/07/23 1219  BP: (!) 111/92 133/71  105/73  Pulse: 82 84  90  Resp: 20 (!) 23  20  Temp: 98.6 F (37 C) 97.7 F (36.5 C)  97.6 F (36.4 C)  TempSrc: Oral Oral  Oral  SpO2: 96% 98%  100%  Weight:   66 kg   Height:       Neurology awake and alert ENT With mild pallor Cardiovascular with S1 and S2 present with no gallops or rubs Respiratory with no rales or wheezing, no rhonchi Abdomen with no distention  No lower extremity edema  Data Reviewed:    Family Communication: no family at the bedside.  I spoke with patient's wife over the phone we talked in detail about patient's condition, plan of care and prognosis and all  questions were addressed.   Disposition: Status is: Inpatient Remains inpatient appropriate because: pending placement   Planned Discharge Destination: Skilled nursing facility    T  Author: Coralie Keens, MD 03/07/2023 3:21 PM  For on call review [email or web address].

## 2023-03-07 NOTE — Progress Notes (Signed)
   Percutaneous chole drain placed in IR 03/02/23  Had been draining well - until today Apparently pt has pulled tubing I have seen and examined drain Tubing is broken It is now crimped and taped to dressing by RN so as not to spill out.  Will need exchange in IR We will call for pt when can

## 2023-03-07 NOTE — Progress Notes (Incomplete)
PROGRESS NOTE    Nathan [name]  UJW:[id number] DOB: 12-26-1945 DOA: 02/20/2023 PCP: Garlan Fillers, MD  77 y.o. M with DM, HTN, CAD s/p CABG and AVR, hx OSA, and history severe necrotizing fasciitis few years ago who presented with chest discomfort. High sensitive troponin 12, 31, 4,527, 6,445   -placed on nitroglycerin drip for pain control and heparin drip for anticoagulation.  8/14 echo: EF 20-25% 8/20 echo: EF 25-30% with RWMA, no thrombus; AV repair noted   8/13: Admitted on heparin to Cardiology for NSTEMI --> evening of HD1, developed respiratory distress and rales requiring BiPAP and ICU transfer 8/14: Developed delirium requiring Precedex; Transaminitis noted 8/15: Cr peaks at 2.15 8/16: Weaned off Precedex; new onset Atrial fibrillation noted 8/17: Work up for leukocytosis started 8/18: Antibiotics started for Pneumonia based on CXR 8/20: Hgb down to 8s 8/21: ID consulted; CT C/A/P shows abnormal gallbladder, new hematoma; heparin stopped 8/22: IR and Gen Surg consulted; HIDA confirms cholecystitis; transfused 2 units; Perc chole drain placed 8/23: Worsening agitation/delirium 8/24 further droop in Hgb down to 6,9, hypotension. Stopped heparin drip and ordered PRBC transfusion x1.  8/25 mentation has improved but not back to baseline, continue to use intermittently soft wrist restrains.  8/26 encephalopathy slowly improving, off restrains and improved po intake.  8/27 patient pulled his biliary drain and will need to be replaced per IR. Antibiotic therapy changed to oral Augmentin. Encephalopathy is improving, continue off restrains.    Subjective:   Assessment and Plan:  NSTEMI (non-ST elevated myocardial infarction) (HCC) Coronary artery disease sp CABG 2010 Echocardiogram with new reduction on LV systolic function, to 20 to 25%, global hypokinesis.  -no further invasive testing planned due to persistent delirium and development of renal failure. -conservative  management Holding anticoagulation today due to worsening anemia.  Continue with aspirin, statin, isosorbide and metoprolol.  Acute on chronic systolic CHF (congestive heart failure) (HCC) Echo w/ EF 20 to 25%, with global hypokinesis, mild dilatated LV cavity, mild LVH, RV systolic function with moderate reduction -Limited medical therapy due to hypotension. Continue with metoprolol   Acute hypoxemic respiratory failure due to acute cardiogenic pulmonary edema. Required non invasive mechanical ventilation on admission.  -resolved Currently 02 saturation is 100% on room air.   Acute metabolic encephalopathy Delirium due to cardiomyopathy and cholecystitis. -PRN haloperidol and IV hydromorphone for pain control.  Quetiapine 25 mg po daily.  Mentation is improving.   Acute cholecystitis -Imaging 8/21 showed signs of gallstones and cholecystitis, confirmed with HIDA. -Perc tube placed 8/22 -Plan for 6 weeks drain, follow up in IR clinic -8/27 transition to oral Augmentin, will need to complete 3 weeks total, and final duration pending repeat imaging.  -8/27 drain pulled by patient, IR consulted to be replaced.   Acute posthemorrhagic anemia 8/21 CT with incompletely imaged heterogeneous mixed hyper and hypodensity in the region of the right adductor musculature measuring 9,0 x 8,6 cm,  likely hematoma.  -sp 3 units PRBC last 8/24  Acute kidney injury on CKD stage 3a, GFR 45-59 ml/min (HCC) AKI, hypokalemia.  -hold on diuretic therapy, will need to resume at the time of his discharge.    Dyslipidemia Continue with statin therapy.   Paroxysmal atrial fibrillation (HCC) New onset.  CHA2DS2-Vasc 6 (age, HTN, CHF, DM, vascular disease).  Continue rate control with metoprolol, holding anticoagulation due to acute bleed (left thigh hematoma)   S/P AVR     DVT prophylaxis: SCDS, anticoag held w/ Large hematoma and anemia Code  Status:  Family Communication: Disposition Plan:    Consultants:    Procedures:   Antimicrobials:    Objective: Vitals:   03/07/23 0827 03/07/23 1127 03/07/23 1219 03/07/23 1633  BP: 133/71  105/73 (!) 116/99  Pulse: 84  90 90  Resp: (!) 23  20 18   Temp: 97.7 F (36.5 C)  97.6 F (36.4 C) 97.8 F (36.6 C)  TempSrc: Oral  Oral Oral  SpO2: 98%  100% 96%  Weight:  66 kg    Height:        Intake/Output Summary (Last 24 hours) at 03/07/2023 1759 Last data filed at 03/07/2023 0600 Gross per 24 hour  Intake 300 ml  Output --  Net 300 ml   Filed Weights   03/05/23 0339 03/06/23 0426 03/07/23 1127  Weight: 65.4 kg 67.3 kg 66 kg    Examination:     Data Reviewed:   CBC: Recent Labs  Lab 03/03/23 0418 03/03/23 2341 03/04/23 0746 03/04/23 1810 03/05/23 0341 03/06/23 0500 03/07/23 0538  WBC 25.7* 23.3* 21.1*  --  20.7* 15.2*  --   HGB 8.2* 7.5* 6.9* 9.1* 8.8* 8.2* 8.9*  HCT 23.2* 21.2* 19.5* 27.3* 25.4* 24.5* 27.2*  MCV 84.1 84.5 86.7  --  84.4 88.1  --   PLT 173 184 158  --  173 177  --    Basic Metabolic Panel: Recent Labs  Lab 03/02/23 0942 03/03/23 0418 03/03/23 2341 03/04/23 0746 03/05/23 0341 03/06/23 0500 03/07/23 0538  NA 139   < > 140 139 139 141 140  K 3.7   < > 3.7 3.8 3.3* 4.2 3.9  CL 106   < > 106 110 108 112* 109  CO2 19*   < > 18* 18* 19* 19* 19*  GLUCOSE 179*   < > 146* 166* 119* 146* 213*  BUN 54*   < > 73* 73* 65* 52* 40*  CREATININE 2.01*   < > 2.69* 2.75* 2.54* 2.05* 1.77*  CALCIUM 8.3*   < > 8.4* 8.0* 8.5* 8.4* 8.6*  MG 2.1  --   --   --   --   --   --   PHOS  --   --   --   --   --   --  2.5   < > = values in this interval not displayed.   GFR: Estimated Creatinine Clearance: 32.6 mL/min (A) (by C-G formula based on SCr of 1.77 mg/dL (H)). Liver Function Tests: Recent Labs  Lab 03/02/23 0942 03/03/23 0418 03/07/23 0538  AST 71* 69*  --   ALT 52* 47*  --   ALKPHOS 64 61  --   BILITOT 1.8* 1.4*  --   PROT 5.8* 5.2*  --   ALBUMIN 2.2* 2.0* 2.0*   No results for  input(s): "LIPASE", "AMYLASE" in the last 168 hours. No results for input(s): "AMMONIA" in the last 168 hours. Coagulation Profile: Recent Labs  Lab 03/03/23 0418  INR 1.3*   Cardiac Enzymes: No results for input(s): "CKTOTAL", "CKMB", "CKMBINDEX", "TROPONINI" in the last 168 hours. BNP (last 3 results) No results for input(s): "PROBNP" in the last 8760 hours. HbA1C: No results for input(s): "HGBA1C" in the last 72 hours. CBG: Recent Labs  Lab 03/07/23 0024 03/07/23 0448 03/07/23 0822 03/07/23 1215 03/07/23 1631  GLUCAP 172* 184* 196* 163* 157*   Lipid Profile: No results for input(s): "CHOL", "HDL", "LDLCALC", "TRIG", "CHOLHDL", "LDLDIRECT" in the last 72 hours. Thyroid Function Tests: No results for  input(s): "TSH", "T4TOTAL", "FREET4", "T3FREE", "THYROIDAB" in the last 72 hours. Anemia Panel: No results for input(s): "VITAMINB12", "FOLATE", "FERRITIN", "TIBC", "IRON", "RETICCTPCT" in the last 72 hours. Urine analysis:    Component Value Date/Time   COLORURINE YELLOW 02/28/2023 0951   APPEARANCEUR HAZY (A) 02/28/2023 0951   LABSPEC 1.016 02/28/2023 0951   PHURINE 5.0 02/28/2023 0951   GLUCOSEU NEGATIVE 02/28/2023 0951   HGBUR MODERATE (A) 02/28/2023 0951   BILIRUBINUR NEGATIVE 02/28/2023 0951   KETONESUR 20 (A) 02/28/2023 0951   PROTEINUR NEGATIVE 02/28/2023 0951   UROBILINOGEN 0.2 10/16/2008 1546   NITRITE NEGATIVE 02/28/2023 0951   LEUKOCYTESUR NEGATIVE 02/28/2023 0951   Sepsis Labs: @LABRCNTIP (procalcitonin:4,lacticidven:4)  ) Recent Results (from the past 240 hour(s))  Urine Culture (for pregnant, neutropenic or urologic patients or patients with an indwelling urinary catheter)     Status: None   Collection Time: 02/27/23  7:45 AM   Specimen: Urine, Clean Catch  Result Value Ref Range Status   Specimen Description URINE, CLEAN CATCH  Final   Special Requests NONE  Final   Culture   Final    NO GROWTH Performed at [facility], [address] [place], [place] [address]    Report Status 02/28/2023 FINAL  Final  Culture, blood (x 2)     Status: None   Collection Time: 02/28/23  4:44 PM   Specimen: BLOOD LEFT ARM  Result Value Ref Range Status   Specimen Description BLOOD LEFT ARM  Final   Special Requests   Final    BOTTLES DRAWN AEROBIC AND ANAEROBIC Blood Culture adequate volume   Culture   Final    NO GROWTH 5 DAYS Performed at [facility], [address] [place], [place] [address]    Report Status 03/05/2023 FINAL  Final  Culture, blood (x 2)     Status: None   Collection Time: 02/28/23  4:58 PM   Specimen: BLOOD LEFT ARM  Result Value Ref Range Status   Specimen Description BLOOD LEFT ARM  Final   Special Requests   Final    BOTTLES DRAWN AEROBIC AND ANAEROBIC Blood Culture results may not be optimal due to an excessive volume of blood received in culture bottles   Culture   Final    NO GROWTH 5 DAYS Performed at [facility], [address], [place], [place] [address]    Report Status 03/05/2023 FINAL  Final  Respiratory (~20 pathogens) panel by PCR     Status: None   Collection Time: 02/28/23  6:10 PM   Specimen: Nasopharyngeal Swab; Respiratory  Result Value Ref Range Status   Adenovirus NOT DETECTED NOT DETECTED Final   Coronavirus 229E NOT DETECTED NOT DETECTED Final    Comment: (NOTE) The Coronavirus on the Respiratory Panel, DOES NOT test for the novel  Coronavirus (2019 nCoV)    Coronavirus HKU1 NOT DETECTED NOT DETECTED Final   Coronavirus NL63 NOT DETECTED NOT DETECTED Final   Coronavirus OC43 NOT DETECTED NOT DETECTED Final   Metapneumovirus NOT DETECTED NOT DETECTED Final   Rhinovirus / Enterovirus NOT DETECTED NOT DETECTED Final   Influenza A NOT DETECTED NOT DETECTED Final   Influenza B NOT DETECTED NOT DETECTED Final   Parainfluenza Virus 1 NOT DETECTED NOT DETECTED Final   Parainfluenza Virus 2 NOT DETECTED NOT DETECTED Final   Parainfluenza Virus 3 NOT DETECTED NOT DETECTED  Final   Parainfluenza Virus 4 NOT DETECTED NOT DETECTED Final   Respiratory Syncytial Virus  NOT DETECTED NOT DETECTED Final   Bordetella pertussis NOT DETECTED NOT DETECTED Final   Bordetella Parapertussis NOT DETECTED NOT DETECTED Final   Chlamydophila pneumoniae NOT DETECTED NOT DETECTED Final   Mycoplasma pneumoniae NOT DETECTED NOT DETECTED Final    Comment: Performed at [facility], [address] [place], [place] [address]  SARS Coronavirus 2 by RT PCR (hospital order, performed in [facility] hospital lab) *cepheid single result test* Anterior Nasal Swab     Status: None   Collection Time: 03/01/23 11:19 AM   Specimen: Anterior Nasal Swab  Result Value Ref Range Status   SARS Coronavirus 2 by RT PCR NEGATIVE NEGATIVE Final    Comment: Performed at [facility], [address], [place], [place] [address]  Aerobic/Anaerobic Culture w Gram Stain (surgical/deep wound)     Status: None   Collection Time: 03/02/23  5:03 PM   Specimen: Wound; Bile  Result Value Ref Range Status   Specimen Description WOUND  Final   Special Requests bile, gallbladder  Final   Gram Stain NO WBC SEEN FEW GRAM NEGATIVE RODS   Final   Culture   Final    MODERATE ESCHERICHIA COLI NO ANAEROBES ISOLATED Performed at [facility], [address], [place], [place] [address]    Report Status 03/07/2023 FINAL  Final   Organism ID, Bacteria ESCHERICHIA COLI  Final      Susceptibility   Escherichia coli - MIC*    AMPICILLIN 16 INTERMEDIATE Intermediate     CEFEPIME <=0.12 SENSITIVE Sensitive     CEFTAZIDIME <=1 SENSITIVE Sensitive     CEFTRIAXONE <=0.25 SENSITIVE Sensitive     CIPROFLOXACIN <=0.25 SENSITIVE Sensitive     GENTAMICIN <=1 SENSITIVE Sensitive     IMIPENEM <=0.25 SENSITIVE Sensitive     TRIMETH/SULFA <=20 SENSITIVE Sensitive     AMPICILLIN/SULBACTAM 4 SENSITIVE Sensitive     PIP/TAZO <=4 SENSITIVE Sensitive     * MODERATE ESCHERICHIA COLI     Radiology  Studies: ECHOCARDIOGRAM LIMITED  Result Date: 03/06/2023    ECHOCARDIOGRAM LIMITED REPORT   Patient Name:   JAMERE OSHER Date of Exam: 03/06/2023 Medical Rec #:  [id number]      Height:       67.0 in Accession #:    [id number]     Weight:       148.4 lb Date of Birth:  [birth year]     BSA:          1.781 m Patient Age:    76 years       BP:           99/53 mmHg Patient Gender: M              HR:           73 bpm. Exam Location:  Inpatient Procedure: Limited Echo, Limited Color Doppler and Intracardiac Opacification            Agent Indications:    Acute MI  History:        Patient has prior history of Echocardiogram examinations, most                 recent 02/28/2023. Acute MI, Prior CABG, Aortic Valve Disease,                 Arrythmias:Bradycardia, Signs/Symptoms:Murmur; Risk                 Factors:Hypertension, Sleep Apnea and Diabetes.  Aortic Valve: 23 mm Edwards bioprosthetic valve is present in                 the aortic position.  Sonographer:    Milda Smart Referring Phys: [id number] Orbie Pyo  Sonographer Comments: Suboptimal parasternal window. Image acquisition challenging due to patient body habitus and Image acquisition challenging due to respiratory motion. IMPRESSIONS  1. Left ventricular ejection fraction, by estimation, is 35 to 40%. The left ventricle has moderately decreased function. The left ventricle demonstrates global hypokinesis. Left ventricular diastolic function could not be evaluated.  2. Right ventricular systolic function is normal. The right ventricular size is normal.  3. The mitral valve is normal in structure. Mild mitral valve regurgitation. No evidence of mitral stenosis. Moderate mitral annular calcification.  4. The aortic valve has been repaired/replaced. Aortic valve regurgitation is not visualized. There is a 23 mm Edwards bioprosthetic valve present in the aortic position.  5. The inferior vena cava is normal in size with greater than 50%  respiratory variability, suggesting right atrial pressure of 3 mmHg. FINDINGS  Left Ventricle: Left ventricular ejection fraction, by estimation, is 35 to 40%. The left ventricle has moderately decreased function. The left ventricle demonstrates global hypokinesis. Definity contrast agent was given IV to delineate the left ventricular endocardial borders. The left ventricular internal cavity size was normal in size. There is no left ventricular hypertrophy. Left ventricular diastolic function could not be evaluated. Right Ventricle: The right ventricular size is normal. No increase in right ventricular wall thickness. Right ventricular systolic function is normal. Left Atrium: Left atrial size was normal in size. Right Atrium: Right atrial size was normal in size. Pericardium: There is no evidence of pericardial effusion. Mitral Valve: The mitral valve is normal in structure. Moderate mitral annular calcification. Mild mitral valve regurgitation. No evidence of mitral valve stenosis. Tricuspid Valve: The tricuspid valve is normal in structure. Tricuspid valve regurgitation is mild . No evidence of tricuspid stenosis. Aortic Valve: The aortic valve has been repaired/replaced. Aortic valve regurgitation is not visualized. There is a 23 mm Edwards bioprosthetic valve present in the aortic position. Pulmonic Valve: The pulmonic valve was normal in structure. Pulmonic valve regurgitation is trivial. No evidence of pulmonic stenosis. Aorta: The aortic root is normal in size and structure. Venous: The inferior vena cava is normal in size with greater than 50% respiratory variability, suggesting right atrial pressure of 3 mmHg. IAS/Shunts: No atrial level shunt detected by color flow Doppler. Additional Comments: Color Doppler performed.  LEFT VENTRICLE PLAX 2D LVIDd:         4.30 cm LVIDs:         3.70 cm  Armanda Magic MD Electronically signed by Armanda Magic MD Signature Date/Time: 03/06/2023/12:51:11 PM    Final       Scheduled Meds:  allopurinol  300 mg Oral Daily   amoxicillin-clavulanate  1 tablet Oral Q12H   vitamin C  1,000 mg Oral Daily   atorvastatin  80 mg Oral Daily   docusate sodium  100 mg Oral BID   insulin aspart  0-9 Units Subcutaneous TID WC   latanoprost  1 drop Both Eyes QHS   melatonin  5 mg Oral QHS   [START ON 03/08/2023] metoprolol succinate  50 mg Oral Daily   metoprolol tartrate  25 mg Oral BID   polyethylene glycol  17 g Oral Daily   QUEtiapine  25 mg Oral QHS   sodium chloride flush  5 mL Intracatheter  Q8H   Continuous Infusions:  sodium chloride Stopped (02/24/23 0824)     LOS: 14 days    Time spent:    Zannie Cove, MD [facility]   03/07/2023, 5:59 PM

## 2023-03-07 NOTE — Progress Notes (Signed)
Patient broke the drain from drainage bag. The bag could not be reconnected. IR team was contacted. Nurse advised to clamp drain using tape and the day team will assess 03/07/2023.

## 2023-03-07 NOTE — Plan of Care (Signed)
  Problem: Activity: Goal: Ability to tolerate increased activity will improve Outcome: Progressing   Problem: Cardiac: Goal: Ability to achieve and maintain adequate cardiovascular perfusion will improve Outcome: Progressing   Problem: Health Behavior/Discharge Planning: Goal: Ability to manage health-related needs will improve Outcome: Progressing   Problem: Metabolic: Goal: Ability to maintain appropriate glucose levels will improve Outcome: Progressing   Problem: Nutritional: Goal: Maintenance of adequate nutrition will improve Outcome: Progressing   Problem: Skin Integrity: Goal: Risk for impaired skin integrity will decrease Outcome: Progressing   Problem: Tissue Perfusion: Goal: Adequacy of tissue perfusion will improve Outcome: Progressing   Problem: Education: Goal: Understanding of CV disease, CV risk reduction, and recovery process will improve Outcome: Progressing Goal: Individualized Educational Video(s) Outcome: Progressing   Problem: Activity: Goal: Ability to tolerate increased activity will improve Outcome: Progressing   Problem: Cardiac: Goal: Ability to achieve and maintain adequate cardiovascular perfusion will improve Outcome: Progressing   Problem: Health Behavior/Discharge Planning: Goal: Ability to manage health-related needs will improve Outcome: Progressing   Problem: Metabolic: Goal: Ability to maintain appropriate glucose levels will improve Outcome: Progressing   Problem: Nutritional: Goal: Maintenance of adequate nutrition will improve Outcome: Progressing   Problem: Skin Integrity: Goal: Risk for impaired skin integrity will decrease Outcome: Progressing   Problem: Tissue Perfusion: Goal: Adequacy of tissue perfusion will improve Outcome: Progressing   Problem: Education: Goal: Understanding of CV disease, CV risk reduction, and recovery process will improve Outcome: Progressing Goal: Individualized Educational  Video(s) Outcome: Progressing

## 2023-03-07 NOTE — Progress Notes (Signed)
Rounding Note    Patient Name: Nathan [name] Date of Encounter: 03/07/2023  [facility] Cardiologist: Donato Schultz, MD   Subjective   Sitting up in bed, offers no complaint this morning. Hopeful to go home soon. Did not eat breakfast   Inpatient Medications    Scheduled Meds:  allopurinol  300 mg Oral Daily   vitamin C  1,000 mg Oral Daily   atorvastatin  80 mg Oral Daily   docusate sodium  100 mg Oral BID   insulin aspart  0-9 Units Subcutaneous TID WC   melatonin  5 mg Oral QHS   metoprolol tartrate  25 mg Oral BID   polyethylene glycol  17 g Oral Daily   QUEtiapine  25 mg Oral QHS   sodium chloride flush  5 mL Intracatheter Q8H   Continuous Infusions:  sodium chloride Stopped (02/24/23 0824)   ampicillin-sulbactam (UNASYN) IV Stopped (03/07/23 0318)   PRN Meds: sodium chloride, acetaminophen, haloperidol lactate, HYDROmorphone (DILAUDID) injection, nitroGLYCERIN, mouth rinse, oxyCODONE-acetaminophen, senna-docusate   Vital Signs    Vitals:   03/06/23 2228 03/07/23 0027 03/07/23 0449 03/07/23 0827  BP: 129/73 127/72 (!) 111/92 133/71  Pulse: 95 74 82 84  Resp:  (!) 23 20 (!) 23  Temp:  98.2 F (36.8 C) 98.6 F (37 C) 97.7 F (36.5 C)  TempSrc:  Oral Oral Oral  SpO2:  96% 96% 98%  Weight:      Height:        Intake/Output Summary (Last 24 hours) at 03/07/2023 1035 Last data filed at 03/07/2023 0600 Gross per 24 hour  Intake 300 ml  Output 100 ml  Net 200 ml      03/06/2023    4:26 AM 03/05/2023    3:39 AM 03/04/2023    4:08 AM  Last 3 Weights  Weight (lbs) 148 lb 5.9 oz 144 lb 2.9 oz 130 lb 15.3 oz  Weight (kg) 67.3 kg 65.4 kg 59.4 kg      Telemetry    Atrial fibrillation, rates controlled - Personally Reviewed  Physical Exam   GEN: No acute distress.   Neck: No JVD Cardiac: Irreg Irreg, no murmurs, rubs, or gallops.  Respiratory: Clear to auscultation bilaterally. GI: Soft, nontender, non-distended  MS: No edema; No deformity.  Perc drain Neuro:  Nonfocal  Psych: Normal affect   Labs    High Sensitivity Troponin:   Recent Labs  Lab 02/20/23 2120 02/20/23 2344 02/21/23 0624 02/21/23 1815 02/21/23 2110  TROPONINIHS 13 12 31* 4,527* 6,445*     Chemistry Recent Labs  Lab 02/28/23 1644 03/01/23 0248 03/02/23 0942 03/03/23 0418 03/03/23 2341 03/05/23 0341 03/06/23 0500 03/07/23 0538  NA 137   < > 139 140   < > 139 141 140  K 3.0*   < > 3.7 3.7   < > 3.3* 4.2 3.9  CL 100   < > 106 108   < > 108 112* 109  CO2 22   < > 19* 18*   < > 19* 19* 19*  GLUCOSE 199*   < > 179* 159*   < > 119* 146* 213*  BUN 45*   < > 54* 64*   < > 65* 52* 40*  CREATININE 1.96*   < > 2.01* 2.49*   < > 2.54* 2.05* 1.77*  CALCIUM 8.5*   < > 8.3* 8.3*   < > 8.5* 8.4* 8.6*  MG  --   --  2.1  --   --   --   --   --  PROT 6.1*  --  5.8* 5.2*  --   --   --   --   ALBUMIN 2.2*  --  2.2* 2.0*  --   --   --  2.0*  AST 73*  --  71* 69*  --   --   --   --   ALT 60*  --  52* 47*  --   --   --   --   ALKPHOS 74  --  64 61  --   --   --   --   BILITOT 2.4*  --  1.8* 1.4*  --   --   --   --   GFRNONAA 35*   < > 34* 26*   < > 25* 33* 39*  ANIONGAP 15   < > 14 14   < > 12 10 12    < > = values in this interval not displayed.    Lipids No results for input(s): "CHOL", "TRIG", "HDL", "LABVLDL", "LDLCALC", "CHOLHDL" in the last 168 hours.  Hematology Recent Labs  Lab 03/04/23 0746 03/04/23 1810 03/05/23 0341 03/06/23 0500 03/07/23 0538  WBC 21.1*  --  20.7* 15.2*  --   RBC 2.25*  --  3.01* 2.78*  --   HGB 6.9*   < > 8.8* 8.2* 8.9*  HCT 19.5*   < > 25.4* 24.5* 27.2*  MCV 86.7  --  84.4 88.1  --   MCH 30.7  --  29.2 29.5  --   MCHC 35.4  --  34.6 33.5  --   RDW 18.6*  --  18.6* 20.0*  --   PLT 158  --  173 177  --    < > = values in this interval not displayed.   Thyroid No results for input(s): "TSH", "FREET4" in the last 168 hours.  BNPNo results for input(s): "BNP", "PROBNP" in the last 168 hours.  DDimer No results for  input(s): "DDIMER" in the last 168 hours.   Cardiac Studies   TTE 02/24/2023: EF 25 to 30%.  Diffuse HK in the inferior, inferoseptal and apical walls as well as distal pole akinesis.  Cannot exclude mural thrombus.  Normal RV with severely elevated RAP of 15 mmHg..  Moderate to severe MAC with mild MR.  Status post AVR with 23 mm Edwards prosthesis.  Mean 8 PG 7 mmHg. TTE 02/28/2023: EF 25 to 30%. TTE 03/06/2023: EF now estimated 35 to 40% global HK.  Unable to assess endocardial borders to determine wall motion.  Unable to assess diastolic function.  Stable 23 mm Edwards prosthetic aortic valve with no notable AI or AAS.  RAP now 3 mmHg.  Patient Profile     77 y.o. male with  hx of CAD s/p CABG x4 in 10/2008, severe aortic stenosis  s/p AVR in 10/2008 at time of CABG, hypertension, hyperlipidemia, type 2 diabetes mellitus, CKD stage III, GERD, and obstructive sleep apnea, severe necrotizing fasciitis and admitted on 02/20/2023 for the evaluation of NSTEMI.    Assessment & Plan    NSTEMI  CAD status post CABG in 2010 -- initially presented with elevated hsTn 4527>>6445. Found to have newly reduced EF 20 to 25% with wall motion abnormalities in the inferior, inferoseptal, apical walls. Plans were for [facility] but have been delayed 2/2 declining renal function, anemia, and altered mental status. At this time will continue to treat medically, no plans for invasive evaluation at this time. -- Continue aspirin, statin, Imdur 30 mg,  Lopressor 25 mg twice daily--> Consolidate to Toprol XL 50mg  daily   Acute HFrEF -- LVEF decline of 20-25% on admission, improved to 35-40%, global hypokinesis, normal RV. Initially required diuresis, but now euvolemic -- GDMT: metoprolol transitioned to Toprol XL, no room for ARB/ARNi/MRA with renal disease   Paroxsymal atrial fibrillation -- Rate controlled with heart rates in the 80s.  Unable to anticoagulate with anemia -- continue Toprol XL   Severe aortic stenosis  status post AVR in 2010 -- Echo this admission shows stable.  Mean gradient 7, peak gradient 14.    Hyperlipidemia -- LDL 37 -- Continue atorvastatin 80 mg   Altered mental status/encephalopathy Suicidal ideations -- currently on delirium precautions, did require precedex earlier in admission. Was expressing suicidal ideations  -- management per primary   CKD with AKI -- Renal function on admission 1.28, peaked at 2.75, improved to 1.77   Acute cholecystitis -- Status post PERC drain on 03/02/2023 -- per primary   Hemorrhagic anemia -- CT on 03/01/2023 indicating mixed hyper/hypodensity region in the right adductor musculature suggesting hematoma.  -- Hgb dropped to 6.9. S/p 3 units PRBCs.  -- Spontaneous bleeding on anticoagulation, therefore would not resume long term OAC at this time. Could reconsider as an outpatient   For questions or updates, please contact [facility] Please consult [email or web address] for contact info under        Signed, Laverda Page, NP  03/07/2023, 10:35 AM

## 2023-03-07 NOTE — Progress Notes (Signed)
PT Cancellation Note  Patient Details Name: Zaydin Aughenbaugh MRN: [id number] DOB: [birth year]   Cancelled Treatment:    Reason Eval/Treat Not Completed: Medical issues which prohibited therapy (HR to 120 bpm with walk to bathroom per nurse and she asked for PT to HOLD this am.)   Bevelyn Buckles 03/07/2023, 11:41 AM Tanee Henery M,PT Acute Rehab Services [phone]

## 2023-03-07 NOTE — Progress Notes (Addendum)
Patient is having increased poor food intake.

## 2023-03-08 DIAGNOSIS — I214 Non-ST elevation (NSTEMI) myocardial infarction: Secondary | ICD-10-CM | POA: Diagnosis not present

## 2023-03-08 LAB — CBC
HCT: 25.6 % — ABNORMAL LOW (ref 39.0–52.0)
Hemoglobin: 8.2 g/dL — ABNORMAL LOW (ref 13.0–17.0)
MCH: 29.4 pg (ref 26.0–34.0)
MCHC: 32 g/dL (ref 30.0–36.0)
MCV: 91.8 fL (ref 80.0–100.0)
Platelets: 188 10*3/uL (ref 150–400)
RBC: 2.79 MIL/uL — ABNORMAL LOW (ref 4.22–5.81)
RDW: 21.4 % — ABNORMAL HIGH (ref 11.5–15.5)
WBC: 11.9 10*3/uL — ABNORMAL HIGH (ref 4.0–10.5)
nRBC: 0.4 % — ABNORMAL HIGH (ref 0.0–0.2)

## 2023-03-08 LAB — BASIC METABOLIC PANEL
Anion gap: 11 (ref 5–15)
BUN: 31 mg/dL — ABNORMAL HIGH (ref 8–23)
CO2: 19 mmol/L — ABNORMAL LOW (ref 22–32)
Calcium: 8.4 mg/dL — ABNORMAL LOW (ref 8.9–10.3)
Chloride: 109 mmol/L (ref 98–111)
Creatinine, Ser: 1.42 mg/dL — ABNORMAL HIGH (ref 0.61–1.24)
GFR, Estimated: 51 mL/min — ABNORMAL LOW (ref >60)
Glucose, Bld: 228 mg/dL — ABNORMAL HIGH (ref 70–99)
Potassium: 3.7 mmol/L (ref 3.5–5.1)
Sodium: 139 mmol/L (ref 135–145)

## 2023-03-08 LAB — GLUCOSE, CAPILLARY
Glucose-Capillary: 158 mg/dL — ABNORMAL HIGH (ref 70–99)
Glucose-Capillary: 166 mg/dL — ABNORMAL HIGH (ref 70–99)
Glucose-Capillary: 167 mg/dL — ABNORMAL HIGH (ref 70–99)
Glucose-Capillary: 195 mg/dL — ABNORMAL HIGH (ref 70–99)
Glucose-Capillary: 214 mg/dL — ABNORMAL HIGH (ref 70–99)
Glucose-Capillary: 218 mg/dL — ABNORMAL HIGH (ref 70–99)
Glucose-Capillary: 259 mg/dL — ABNORMAL HIGH (ref 70–99)

## 2023-03-08 MED ORDER — ENSURE ENLIVE PO LIQD
237.0000 mL | Freq: Two times a day (BID) | ORAL | Status: DC
  Administered 2023-03-08 (×2): 237 mL via ORAL

## 2023-03-08 MED ORDER — ADULT MULTIVITAMIN W/MINERALS CH
1.0000 | ORAL_TABLET | Freq: Every day | ORAL | Status: DC
  Administered 2023-03-08 – 2023-03-13 (×6): 1 via ORAL
  Filled 2023-03-08 (×6): qty 1

## 2023-03-08 MED ORDER — EMPAGLIFLOZIN 10 MG PO TABS
10.0000 mg | ORAL_TABLET | Freq: Every day | ORAL | Status: DC
  Administered 2023-03-08 – 2023-03-10 (×3): 10 mg via ORAL
  Filled 2023-03-08 (×3): qty 1

## 2023-03-08 MED ORDER — HYDROMORPHONE HCL 1 MG/ML IJ SOLN
0.5000 mg | INTRAMUSCULAR | Status: DC | PRN
  Administered 2023-03-10: 0.5 mg via INTRAVENOUS
  Filled 2023-03-08: qty 1

## 2023-03-08 NOTE — NC FL2 (Addendum)
[place] MEDICAID FL2 LEVEL OF CARE FORM     IDENTIFICATION  Patient Name: Nathan [name] Birthdate: 1945-08-08 Sex: male Admission Date (Current Location): 02/20/2023  [facility] and [place] Number:  Producer, television/film/video and Address:  [facility], [address], [place], [place] [address]      Provider Number: [id number]  Attending Physician Name and Address:  Zannie Cove, MD  Relative Name and Phone Number:  Gavin Pound (spouse) [phone]    Current Level of Care: Hospital Recommended Level of Care: Skilled Nursing Facility Prior Approval Number:    Date Approved/Denied:   PASRR Number: [id number]  Discharge Plan: SNF    Current Diagnoses: Patient Active Problem List   Diagnosis Date Noted   Groin hematoma 03/03/2023   Acute cholecystitis 03/02/2023   Paroxysmal atrial fibrillation (HCC) 03/02/2023   Acute posthemorrhagic anemia 03/02/2023   Hypokalemia 03/02/2023   Acute kidney injury on CKD stage 3a, GFR 45-59 ml/min (HCC) 03/01/2023   CKD stage 3a, GFR 45-59 ml/min (HCC) 03/01/2023   Acute metabolic encephalopathy 02/28/2023   NSTEMI (non-ST elevated myocardial infarction) (HCC) 02/21/2023   S/P AVR 02/21/2023   Acute on chronic systolic CHF (congestive heart failure) (HCC) 02/21/2023   Acute respiratory failure with hypoxia (HCC) 02/21/2023   Abdominal pain 01/21/2023   Tachycardia 01/21/2023   Elevated LFTs 01/21/2023   Balance problem 04/26/2022   Neuropathic pain 04/26/2022   Primary open-angle glaucoma, bilateral, mild stage 08/16/2021   Pseudophakia, both eyes 08/16/2021   Moderate nonproliferative diabetic retinopathy of both eyes without macular edema associated with type 2 diabetes mellitus (HCC) 08/16/2021   Posterior vitreous detachment of both eyes 08/16/2021   DM (diabetes mellitus), secondary, with neurologic complications (HCC) 06/13/2017   Obesity (BMI 30-39.9) 03/15/2016   Abnormal nuclear stress test 03/01/2016     umbilical hernia repair open with Ventralex mesh Oct 2014 04/10/2013   Other general symptoms  01/06/2011   Personal history of colonic polyps 01/06/2011   Iron deficiency anemia 01/06/2011   Gastritis, chronic 01/06/2011   Benign hypertensive heart disease without heart failure 10/13/2010   Hx of CABG 10/13/2010   Systolic hypertension    Sinus bradycardia    CAD (coronary artery disease)    History of prosthetic aortic valve    History of diabetic retinopathy    History of necrotizing fasciitis    Postoperative anemia    Nightmares    B12 DEFICIENCY 12/02/2009   ESOPHAGEAL REFLUX 11/19/2009   CHEST PAIN 11/19/2009   BLOOD IN STOOL, OCCULT 11/19/2009   ANEMIA-IRON DEFICIENCY 08/19/2008   S/P aortic valve replacement with bioprosthetic valve 08/19/2008   GI BLEED 08/19/2008   Type 2 diabetes mellitus with hyperlipidemia (HCC) 08/14/2008   Dyslipidemia 08/14/2008   ANXIETY 08/14/2008   OBSTRUCTIVE SLEEP APNEA 08/14/2008   GLAUCOMA 08/14/2008   Essential hypertension 08/14/2008   PHARYNGITIS 08/14/2008   GASTRIC ULCER 08/14/2008   GASTRITIS 08/14/2008   DIVERTICULOSIS, COLON 08/14/2008    Orientation RESPIRATION BLADDER Height & Weight     Self, Time, Place  Normal Continent Weight: 145 lb 8 oz (66 kg) Height:  5\' 7"  (170.2 cm)  BEHAVIORAL SYMPTOMS/MOOD NEUROLOGICAL BOWEL NUTRITION STATUS      Continent Diet (Please see discharge summary)  AMBULATORY STATUS COMMUNICATION OF NEEDS Skin   Limited Assist Verbally Other (Comment) (Abrasion,Leg,Left,Right,Ecchymosis,arm,Left,Right,Erythema,Groin,Left,Right,Wound/Incision LDAs)                       Personal Care Assistance Level  of Assistance  Bathing, Feeding, Dressing Bathing Assistance: Limited assistance Feeding assistance: Independent Dressing Assistance: Limited assistance     Functional Limitations Info  Sight, Hearing, Speech Sight Info: Impaired Financial trader) Hearing Info: Impaired Speech Info:  Adequate    SPECIAL CARE FACTORS FREQUENCY  PT (By licensed PT), OT (By licensed OT)     PT Frequency: 5x min weekly OT Frequency: 5x min weekly            Contractures Contractures Info: Not present    Additional Factors Info  Code Status, Allergies, Insulin Sliding Scale, Psychotropic Code Status Info: DNR Allergies Info: Shellfish-derived Products,Zolpidem Tartrate Psychotropic Info: QUEtiapine (SEROQUEL) tablet 25 mg daily at bedtime,melatonin tablet 5 mg daily at bedtime Insulin Sliding Scale Info: insulin aspart (novoLOG) injection 0-9 Units 3 times daily with meals       Current Medications (03/08/2023):  This is the current hospital active medication list Current Facility-Administered Medications  Medication Dose Route Frequency Provider Last Rate Last Admin   0.9 %  sodium chloride infusion   Intravenous PRN O'Neal, Ronnald Ramp, MD   Stopped at 02/24/23 0824   acetaminophen (TYLENOL) tablet 650 mg  650 mg Oral Q4H PRN Sande Rives, MD   650 mg at 03/02/23 0039   allopurinol (ZYLOPRIM) tablet 300 mg  300 mg Oral Daily Sande Rives, MD   300 mg at 03/08/23 0919   amoxicillin-clavulanate (AUGMENTIN) 875-125 MG per tablet 1 tablet  1 tablet Oral Q12H Arrien, York Ram, MD   1 tablet at 03/08/23 4098   ascorbic acid (VITAMIN C) tablet 1,000 mg  1,000 mg Oral Daily Sande Rives, MD   1,000 mg at 03/08/23 0920   atorvastatin (LIPITOR) tablet 80 mg  80 mg Oral Daily Sande Rives, MD   80 mg at 03/08/23 0920   docusate sodium (COLACE) capsule 100 mg  100 mg Oral BID Marcelino Duster, PA   100 mg at 03/08/23 1191   empagliflozin (JARDIANCE) tablet 10 mg  10 mg Oral Daily Zannie Cove, MD   10 mg at 03/08/23 1204   feeding supplement (ENSURE ENLIVE / ENSURE PLUS) liquid 237 mL  237 mL Oral BID BM Zannie Cove, MD   237 mL at 03/08/23 1328   haloperidol lactate (HALDOL) injection 1 mg  1 mg Intramuscular Q6H PRN Arrien, York Ram, MD       HYDROmorphone (DILAUDID) injection 0.5 mg  0.5 mg Intravenous Q4H PRN Zannie Cove, MD       insulin aspart (novoLOG) injection 0-9 Units  0-9 Units Subcutaneous TID WC Arrien, York Ram, MD   2 Units at 03/08/23 1201   latanoprost (XALATAN) 0.005 % ophthalmic solution 1 drop  1 drop Both Eyes QHS Coralie Keens, MD   1 drop at 03/07/23 2317   melatonin tablet 5 mg  5 mg Oral QHS Sundil, Subrina, MD   5 mg at 03/07/23 2226   metoprolol succinate (TOPROL-XL) 24 hr tablet 50 mg  50 mg Oral Daily Laverda Page B, NP   50 mg at 03/08/23 4782   nitroGLYCERIN (NITROSTAT) SL tablet 0.4 mg  0.4 mg Sublingual Q5 Min x 3 PRN Sande Rives, MD       Oral care mouth rinse  15 mL Mouth Rinse PRN O'Neal, Ronnald Ramp, MD       oxyCODONE-acetaminophen (PERCOCET/ROXICET) 5-325 MG per tablet 1 tablet  1 tablet Oral Daily PRN Sande Rives, MD   1 tablet at 03/04/23  0544   polyethylene glycol (MIRALAX / GLYCOLAX) packet 17 g  17 g Oral Daily Marcelino Duster, [place]   17 g at 03/08/23 5956   QUEtiapine (SEROQUEL) tablet 25 mg  25 mg Oral QHS Alberteen Sam, MD   25 mg at 03/07/23 2226   senna-docusate (Senokot-S) tablet 2 tablet  2 tablet Oral BID PRN Sande Rives, MD   2 tablet at 03/06/23 2228   sodium chloride flush (NS) 0.9 % injection 5 mL  5 mL Intracatheter Q8H Gilmer Mor, DO   5 mL at 03/08/23 1329     Discharge Medications: Please see discharge summary for a list of discharge medications.  Relevant Imaging Results:  Relevant Lab Results:   Additional Information [id number]  Delilah Shan, LCSWA

## 2023-03-08 NOTE — Progress Notes (Signed)
    Brief Note  -- Remains relatively stable from a CV standpoint  No new recommendations.  Will continue to follow at a distance.  Contact if issues arise.  Bryan Lemma, MD

## 2023-03-08 NOTE — Progress Notes (Signed)
Initial Nutrition Assessment  DOCUMENTATION CODES:   Severe malnutrition in context of chronic illness  INTERVENTION:   Liberalize diet to Carb Modified  Strawberry Ensure Plus High Protein po BID, each supplement provides 350 kcal and 20 grams of protein.  MVI   Diet edu on heart healthy/consistent carb/low sodium diet   NUTRITION DIAGNOSIS:   Severe Malnutrition related to chronic illness as evidenced by severe muscle depletion, severe fat depletion.  GOAL:   Patient will meet greater than or equal to 90% of their needs  MONITOR:   PO intake, Weight trends, Supplement acceptance, Skin, I & O's, Labs  REASON FOR ASSESSMENT:   Consult Assessment of nutrition requirement/status, Poor PO  ASSESSMENT:    77 y.o. male with a known history of Allergies, OA, s/p AVR 2001, CAD s/p MI, DM, GERD, HTN, HLD, OSA, chronic nightmares who presents for evaluation of NSTEMI and was planned to have a L heart cath which was not indicated due to AKI and acute illness  Per MD notes:  - new dx of A-fib - acute systolic CHF   Visited patient at bedside whose wife was present. He reports a poor appetite due to not liking the food. Patient is s/p CABG. He reports 50# wt loss which was partially intentional. In addition to taking Jardiance, patient reports he was following an intense exercise regimen which he believes may have contributed to the cause of his NSTEMI  He dislikes most meat but states he would eat a cheeseburger. Patient reports preferred proteins are eggs, beans and some lean meats. He is willing to try Ensure. No c/o trouble chewing/swallowing. No N/V/D/C.    Labs: CBG 195, BUN 31, Cr 1.42 Meds: augmentin, vitamin C, lipitor, colace, jardiance, Ensure BID, insulin, miralax, seroquel, NS, miralax  Wt: 11.6 kg (15%) wt loss x 7 months  03/07/23 66 kg  01/26/23 68.9 kg  01/21/23 68 kg  07/20/22 73 kg  07/20/21 76.8 kg  08/03/20 77.6 kg  07/26/19 83 kg  06/21/18 85.4 kg   06/21/17 86.5 kg  06/13/17 88.9 kg   PO: 25-35% x last 6 documented meals   NUTRITION - FOCUSED PHYSICAL EXAM:  Flowsheet Row Most Recent Value  Orbital Region Severe depletion  Upper Arm Region Severe depletion  Thoracic and Lumbar Region Unable to assess  Buccal Region Moderate depletion  Temple Region Severe depletion  Clavicle Bone Region Severe depletion  Clavicle and Acromion Bone Region Severe depletion  Scapular Bone Region Unable to assess  Dorsal Hand Severe depletion  Patellar Region Severe depletion  Anterior Thigh Region Severe depletion  Posterior Calf Region Moderate depletion  Edema (RD Assessment) None  Hair Reviewed  Eyes Reviewed  Mouth Reviewed  Skin Reviewed  Nails Reviewed       Diet Order:   Diet Order             Diet heart healthy/carb modified Room service appropriate? Yes; Fluid consistency: Thin  Diet effective now           Diet - low sodium heart healthy                   EDUCATION NEEDS:   Education needs have been addressed  Skin:  Skin Assessment: Reviewed RN Assessment  Last BM:  8/27 type 6  Height:   Ht Readings from Last 1 Encounters:  02/25/23 5\' 7"  (1.702 m)    Weight:   Wt Readings from Last 1 Encounters:  03/07/23 66 kg  Ideal Body Weight:     BMI:  Body mass index is 22.79 kg/m.  Estimated Nutritional Needs:   Kcal:  1650-1980  Protein:  80-100 g  Fluid:  >1.9 L   Leodis Rains, RDN, LDN  Clinical Nutrition

## 2023-03-08 NOTE — TOC Initial Note (Addendum)
Transition of Care [facility]) - Initial/Assessment Note    Patient Details  Name: Nathan [name] MRN: [id number] Date of Birth: 08-06-1945  Transition of [facility]) CM/SW Contact:    Delilah Shan, LCSWA Phone Number: 03/08/2023, 1:40 PM  Clinical Narrative:                  CSW received consult for possible SNF placement at time of discharge. Due to patients current orientation CSW LVM for patients spouse Gavin Pound. CSW awaiting callback to discuss PT recommendations for patient.No further questions reported at this time. CSW to continue to follow and assist with discharge planning needs  Update-3:34pm- CSW spoke with patients spouse Gavin Pound regarding PT recommendations for SNF placement.  Patients spouse reports PTA patient comes from home with her.  Patients spouse expressed understanding of PT recommendation and is agreeable to SNF placement for patient at time of discharge. Patients spouse reports preference for [facility] or [facility] . Patients spouse gave CSW permission to fax out initial referral near the [place] area for possible SNF placement.CSW discussed insurance authorization process. No further questions reported at this time. CSW to continue to follow and assist with discharge planning needs.   Patients Passr is pending. CSW submitted requested clinicals to [place] must for review.   Expected Discharge Plan: Home w Home Health Services Barriers to Discharge: Continued Medical Work up   Patient Goals and CMS Choice     Choice offered to / list presented to : Spouse, Patient      Expected Discharge Plan and Services   Discharge Planning Services: CM Consult   Living arrangements for the past 2 months: Single Family Home                 DME Arranged: Walker rolling DME Agency: [employer] Date DME Agency Contacted: 02/27/23 Time DME Agency Contacted: 1302 Representative spoke with at DME Agency: Ian Malkin HH Arranged: PT HH Agency: [facility] Date [facility] Agency  Contacted: 02/27/23 Time HH Agency Contacted: 1302 Representative spoke with at [facility] Agency: Tresa Endo  Prior Living Arrangements/Services Living arrangements for the past 2 months: Single Family Home Lives with:: Spouse Patient language and need for interpreter reviewed:: Yes Do you feel safe going back to the place where you live?: Yes      Need for Family Participation in Patient Care: Yes (Comment) Care giver support system in place?: Yes (comment)   Criminal Activity/Legal Involvement Pertinent to Current Situation/Hospitalization: No - Comment as needed  Activities of Daily Living Home Assistive Devices/Equipment: None ADL Screening (condition at time of admission) Patient's cognitive ability adequate to safely complete daily activities?: Yes Is the patient deaf or have difficulty hearing?: Yes Does the patient have difficulty seeing, even when wearing glasses/contacts?: No Does the patient have difficulty concentrating, remembering, or making decisions?: Yes Patient able to express need for assistance with ADLs?: No Does the patient have difficulty dressing or bathing?: No Independently performs ADLs?: Yes (appropriate for developmental age) Does the patient have difficulty walking or climbing stairs?: No Weakness of Legs: None Weakness of Arms/Hands: None  Permission Sought/Granted   Permission granted to share information with : Yes, Verbal Permission Granted  Share Information with NAME: Lake Bells  [facility]  [phone]           Emotional Assessment   Attitude/Demeanor/Rapport: Unable to Assess Affect (typically observed): Unable to Assess Orientation: : Oriented to Self, Oriented to Place, Oriented to  Time, Oriented to Situation Alcohol / Substance Use: Not  Applicable Psych Involvement: No (comment)  Admission diagnosis:  Chest pain [R07.9] Patient Active Problem List   Diagnosis Date Noted   Groin hematoma 03/03/2023   Acute cholecystitis 03/02/2023    Paroxysmal atrial fibrillation (HCC) 03/02/2023   Acute posthemorrhagic anemia 03/02/2023   Hypokalemia 03/02/2023   Acute kidney injury on CKD stage 3a, GFR 45-59 ml/min (HCC) 03/01/2023   CKD stage 3a, GFR 45-59 ml/min (HCC) 03/01/2023   Acute metabolic encephalopathy 02/28/2023   NSTEMI (non-ST elevated myocardial infarction) (HCC) 02/21/2023   S/P AVR 02/21/2023   Acute on chronic systolic CHF (congestive heart failure) (HCC) 02/21/2023   Acute respiratory failure with hypoxia (HCC) 02/21/2023   Abdominal pain 01/21/2023   Tachycardia 01/21/2023   Elevated LFTs 01/21/2023   Balance problem 04/26/2022   Neuropathic pain 04/26/2022   Primary open-angle glaucoma, bilateral, mild stage 08/16/2021   Pseudophakia, both eyes 08/16/2021   Moderate nonproliferative diabetic retinopathy of both eyes without macular edema associated with type 2 diabetes mellitus (HCC) 08/16/2021   Posterior vitreous detachment of both eyes 08/16/2021   DM (diabetes mellitus), secondary, with neurologic complications (HCC) 06/13/2017   Obesity (BMI 30-39.9) 03/15/2016   Abnormal nuclear stress test 03/01/2016    umbilical hernia repair open with Ventralex mesh Oct 2014 04/10/2013   Other general symptoms  01/06/2011   Personal history of colonic polyps 01/06/2011   Iron deficiency anemia 01/06/2011   Gastritis, chronic 01/06/2011   Benign hypertensive heart disease without heart failure 10/13/2010   Hx of CABG 10/13/2010   Systolic hypertension    Sinus bradycardia    CAD (coronary artery disease)    History of prosthetic aortic valve    History of diabetic retinopathy    History of necrotizing fasciitis    Postoperative anemia    Nightmares    B12 DEFICIENCY 12/02/2009   ESOPHAGEAL REFLUX 11/19/2009   CHEST PAIN 11/19/2009   BLOOD IN STOOL, OCCULT 11/19/2009   ANEMIA-IRON DEFICIENCY 08/19/2008   S/P aortic valve replacement with bioprosthetic valve 08/19/2008   GI BLEED 08/19/2008   Type 2  diabetes mellitus with hyperlipidemia (HCC) 08/14/2008   Dyslipidemia 08/14/2008   ANXIETY 08/14/2008   OBSTRUCTIVE SLEEP APNEA 08/14/2008   GLAUCOMA 08/14/2008   Essential hypertension 08/14/2008   PHARYNGITIS 08/14/2008   GASTRIC ULCER 08/14/2008   GASTRITIS 08/14/2008   DIVERTICULOSIS, COLON 08/14/2008   PCP:  Garlan Fillers, MD Pharmacy:   [facility] [phone] [facility] - [place], [place] - [address] [address] Phone: [phone] Fax: [phone]     Social Determinants of Health (SDOH) Social History: SDOH Screenings   Food Insecurity: No Food Insecurity (02/25/2023)  Housing: Low Risk  (02/25/2023)  Transportation Needs: No Transportation Needs (02/25/2023)  Utilities: Not At Risk (02/25/2023)  Tobacco Use: Low Risk  (02/27/2023)   SDOH Interventions:     Readmission Risk Interventions    02/22/2023    2:58 PM  Readmission Risk Prevention Plan  Transportation Screening Complete  HRI or Home Care Consult Complete  Social Work Consult for Recovery Care Planning/Counseling Complete  Palliative Care Screening Not Applicable  Medication Review Oceanographer) Referral to Pharmacy

## 2023-03-08 NOTE — Progress Notes (Signed)
Referring Physician(s): Dr Ella Jubilee  Supervising Physician: Oley Balm  Patient Status:  [name] - In-pt  Chief Complaint:  Cholecystitis   Subjective:  Perc chole drain placed in IR 8/22 Called to bedside per RN--- pt had pulled tube and seems broken IR replaced chole drain 8/27 Draining well this am Soft binder around abdomen in place  Allergies: Shellfish-derived products and Zolpidem tartrate  Medications: Prior to Admission medications   Medication Sig Start Date End Date Taking? Authorizing Provider  allopurinol (ZYLOPRIM) 300 MG tablet Take 300 mg by mouth daily.   Yes [provider]  ascorbic acid (VITAMIN C) 500 MG tablet Take by mouth daily.   Yes [provider]  aspirin 81 MG tablet Take 1 tablet (81 mg total) by mouth daily. Patient taking differently: Take 81 mg by mouth at bedtime. 02/09/16  Yes Jake Bathe, MD  atorvastatin (LIPITOR) 20 MG tablet Take 20 mg by mouth daily. 07/25/15  Yes [provider]  Cholecalciferol (VITAMIN D-3 PO) Take 1 capsule by mouth in the morning and at bedtime.   Yes [provider]  Coenzyme Q10 (COQ10) 100 MG CAPS Take 100 mg by mouth daily.   Yes [provider]  fish oil-omega-3 fatty acids 1000 MG capsule Take by mouth daily.   Yes [provider]  glipiZIDE (GLUCOTROL) 5 MG tablet Take 5 mg by mouth every morning.   Yes [provider]  hydrochlorothiazide (MICROZIDE) 12.5 MG capsule Take 12.5 mg by mouth daily. Reported on 08/20/2015 10/13/10  Yes Cassell Clement, MD  isosorbide mononitrate (IMDUR) 30 MG 24 hr tablet TAKE 1 TABLET (30 MG TOTAL) BY MOUTH DAILY. **DO NOT CRUSH** 07/20/22  Yes Swinyer, Zachary George, NP  metFORMIN (GLUCOPHAGE) 1000 MG tablet Take 1,000 mg by mouth in the morning and at bedtime.   Yes [provider]  metoprolol (LOPRESSOR) 50 MG tablet Take 50 mg by mouth 2 (two) times daily.   Yes [provider]  minoxidil (LONITEN)  10 MG tablet Take 10 mg by mouth daily.  07/20/15  Yes [provider]  VITAMIN A PO Take by mouth daily.   Yes [provider]  acetaminophen (TYLENOL) 500 MG tablet Take 500 mg by mouth daily as needed for moderate pain. Patient not taking: Reported on 02/23/2023    [provider]  ALPRAZolam Prudy Feeler) 0.25 MG tablet Take 0.25 mg by mouth daily as needed for anxiety. Patient not taking: Reported on 02/23/2023    [provider]  amLODipine (NORVASC) 5 MG tablet Take 5 mg by mouth every morning. Patient not taking: Reported on 02/23/2023    [provider]  amoxicillin-clavulanate (AUGMENTIN) 875-125 MG tablet Take 1 tablet by mouth every 12 (twelve) hours. Patient not taking: Reported on 02/23/2023 01/21/23   Evlyn Kanner T, PA-C  Cyanocobalamin (VITAMIN B-12 IJ) Inject 1,000 mcg into the muscle every 30 (thirty) days. Amt/dose is unknown Patient not taking: Reported on 02/23/2023    [provider]  cycloSPORINE (RESTASIS) 0.05 % ophthalmic emulsion Place 1 drop into both eyes 2 (two) times daily. Patient not taking: Reported on 02/23/2023    [provider]  fluorouracil (EFUDEX) 5 % cream Apply topically daily. Patient not taking: Reported on 02/23/2023 06/10/22   [provider]  HYDROcodone-acetaminophen (VICODIN) 5-500 MG per tablet Take 1 tablet by mouth daily as needed for pain.  Patient not taking: Reported on 02/23/2023    [provider]  ketoconazole (NIZORAL) 2 %  cream 2 (two) times daily. Patient not taking: Reported on 02/23/2023 06/10/22   [provider]  meloxicam (MOBIC) 15 MG tablet Take 15 mg by mouth as needed for pain. Patient not taking: Reported on 02/23/2023 05/26/22   [provider]  Multiple Vitamin (MULTIVITAMIN) tablet Take 1 tablet by mouth daily. Patient not taking: Reported on 02/23/2023    [provider]  nitroGLYCERIN (NITROSTAT) 0.4 MG SL tablet Place 1 tablet  under the tongue as needed. Patient not taking: Reported on 02/23/2023 09/01/16   [provider]  oxyCODONE-acetaminophen (PERCOCET/ROXICET) 5-325 MG tablet Take 1 tablet by mouth daily as needed for moderate pain or severe pain.  Patient not taking: Reported on 02/23/2023    [provider]  trolamine salicylate (ASPERCREME) 10 % cream Apply 1 application topically as needed for muscle pain. Patient not taking: Reported on 02/23/2023    [provider]     Vital Signs: BP 124/65 (BP Location: Right Arm)   Pulse 85   Temp 97.7 F (36.5 C) (Oral)   Resp 18   Ht 5\' 7"  (1.702 m)   Wt 145 lb 8 oz (66 kg)   SpO2 96%   BMI 22.79 kg/m   Physical Exam Vitals reviewed.  Skin:    General: Skin is warm.     Comments: Chole drain in place Draining well Site is clean and dry No bleeding No sign of infection     Imaging: ECHOCARDIOGRAM LIMITED  Result Date: 03/06/2023    ECHOCARDIOGRAM LIMITED REPORT   Patient Name:   Nathan [name] Date of Exam: 03/06/2023 Medical Rec #:  [id number]      Height:       67.0 in Accession #:    [id number]     Weight:       148.4 lb Date of Birth:  12-13-1945     BSA:          1.781 m Patient Age:    77 years       BP:           99/53 mmHg Patient Gender: M              HR:           73 bpm. Exam Location:  Inpatient Procedure: Limited Echo, Limited Color Doppler and Intracardiac Opacification            Agent Indications:    Acute MI  History:        Patient has prior history of Echocardiogram examinations, most                 recent 02/28/2023. Acute MI, Prior CABG, Aortic Valve Disease,                 Arrythmias:Bradycardia, Signs/Symptoms:Murmur; Risk                 Factors:Hypertension, Sleep Apnea and Diabetes.                 Aortic Valve: 23 mm Edwards bioprosthetic valve is present in                 the aortic position.  Sonographer:    Milda Smart Referring Phys: [id number] Orbie Pyo  Sonographer Comments: Suboptimal  parasternal window. Image acquisition challenging due to patient body habitus and Image acquisition challenging due to respiratory motion. IMPRESSIONS  1. Left ventricular ejection fraction, by estimation, is 35 to 40%. The left ventricle has  moderately decreased function. The left ventricle demonstrates global hypokinesis. Left ventricular diastolic function could not be evaluated.  2. Right ventricular systolic function is normal. The right ventricular size is normal.  3. The mitral valve is normal in structure. Mild mitral valve regurgitation. No evidence of mitral stenosis. Moderate mitral annular calcification.  4. The aortic valve has been repaired/replaced. Aortic valve regurgitation is not visualized. There is a 23 mm Edwards bioprosthetic valve present in the aortic position.  5. The inferior vena cava is normal in size with greater than 50% respiratory variability, suggesting right atrial pressure of 3 mmHg. FINDINGS  Left Ventricle: Left ventricular ejection fraction, by estimation, is 35 to 40%. The left ventricle has moderately decreased function. The left ventricle demonstrates global hypokinesis. Definity contrast agent was given IV to delineate the left ventricular endocardial borders. The left ventricular internal cavity size was normal in size. There is no left ventricular hypertrophy. Left ventricular diastolic function could not be evaluated. Right Ventricle: The right ventricular size is normal. No increase in right ventricular wall thickness. Right ventricular systolic function is normal. Left Atrium: Left atrial size was normal in size. Right Atrium: Right atrial size was normal in size. Pericardium: There is no evidence of pericardial effusion. Mitral Valve: The mitral valve is normal in structure. Moderate mitral annular calcification. Mild mitral valve regurgitation. No evidence of mitral valve stenosis. Tricuspid Valve: The tricuspid valve is normal in structure. Tricuspid valve  regurgitation is mild . No evidence of tricuspid stenosis. Aortic Valve: The aortic valve has been repaired/replaced. Aortic valve regurgitation is not visualized. There is a 23 mm Edwards bioprosthetic valve present in the aortic position. Pulmonic Valve: The pulmonic valve was normal in structure. Pulmonic valve regurgitation is trivial. No evidence of pulmonic stenosis. Aorta: The aortic root is normal in size and structure. Venous: The inferior vena cava is normal in size with greater than 50% respiratory variability, suggesting right atrial pressure of 3 mmHg. IAS/Shunts: No atrial level shunt detected by color flow Doppler. Additional Comments: Color Doppler performed.  LEFT VENTRICLE PLAX 2D LVIDd:         4.30 cm LVIDs:         3.70 cm  Armanda Magic MD Electronically signed by Armanda Magic MD Signature Date/Time: 03/06/2023/12:51:11 PM    Final     Labs:  CBC: Recent Labs    03/03/23 2341 03/04/23 0746 03/04/23 1810 03/05/23 0341 03/06/23 0500 03/07/23 0538  WBC 23.3* 21.1*  --  20.7* 15.2*  --   HGB 7.5* 6.9* 9.1* 8.8* 8.2* 8.9*  HCT 21.2* 19.5* 27.3* 25.4* 24.5* 27.2*  PLT 184 158  --  173 177  --     COAGS: Recent Labs    02/23/23 1616 03/03/23 0418  INR  --  1.3*  APTT 36  --     BMP: Recent Labs    03/04/23 0746 03/05/23 0341 03/06/23 0500 03/07/23 0538  NA 139 139 141 140  K 3.8 3.3* 4.2 3.9  CL 110 108 112* 109  CO2 18* 19* 19* 19*  GLUCOSE 166* 119* 146* 213*  BUN 73* 65* 52* 40*  CALCIUM 8.0* 8.5* 8.4* 8.6*  CREATININE 2.75* 2.54* 2.05* 1.77*  GFRNONAA 23* 25* 33* 39*    LIVER FUNCTION TESTS: Recent Labs    02/28/23 0659 02/28/23 1644 03/02/23 0942 03/03/23 0418 03/07/23 0538  BILITOT 2.5* 2.4* 1.8* 1.4*  --   AST 72* 73* 71* 69*  --   ALT 58* 60* 52*  47*  --   ALKPHOS 81 74 64 61  --   PROT 5.8* 6.1* 5.8* 5.2*  --   ALBUMIN 2.2* 2.2* 2.2* 2.0* 2.0*    Drain Location: RUQ Size: Fr size: 10 Fr Date of placement: 8/22  Currently to: Drain  collection device: gravity 24 hour output:  Output by Drain (mL) 03/06/23 0701 - 03/06/23 1900 03/06/23 1901 - 03/07/23 0700 03/07/23 0701 - 03/07/23 1900 03/07/23 1901 - 03/08/23 0700 03/08/23 0701 - 03/08/23 0903  Closed System Drain 1 Right Abdomen 10 Fr.    175     Interval imaging/drain manipulation:  Drain exchange 8/28 in IR  Current examination: Flushes/aspirates easily.  Insertion site unremarkable. Suture and stat lock in place. Dressed appropriately.  Draining bilious fluid--- draining well  Plan: Continue TID flushes with 5 cc NS. Record output Q shift. Dressing changes QD or PRN if soiled.  Call IR APP or on call IR MD if difficulty flushing or sudden change in drain output.    Discharge planning: Percutaneous cholecystostomy drain to remain in place at least 6 weeks.  Recommend fluoroscopy with injection of the drain in IR to evaluate for patency of the cystic duct. If the duct is patent and general surgery feels patient is stable for cholecystectomy, the drain would be removed at time of surgery. If the duct is patent and general surgery feels patient is NEVER a candidate for cholecystectomy, drain can be capped for a trial. If symptoms recur, then place to gravity bag again. If trial is successful, discuss possible removal of the drain. If trial in unsuccessful, then patient will need routine exchanges of the  chole tube about every 8-10 weeks.  OP orders are in place  Please call the IR PA at [phone] when patient is about to be discharged and we will arrange the follow up drain injection (ok to leave message).    Electronically Signed: Robet Leu, PA-C 03/08/2023, 9:03 AM   I spent a total of 15 Minutes at the the patient's bedside AND on the patient's hospital floor or unit, greater than 50% of which was counseling/coordinating care for perc chole drain

## 2023-03-08 NOTE — Progress Notes (Signed)
   03/08/23 1300  PT Visit Information  Last PT Received On 03/08/23   PT - Assessment/Plan  Follow Up Recommendations Skilled nursing-short term rehab (<3 hours/day)  PT equipment Rolling walker (2 wheels)   Updated d/c plan as pt has had medical issues as well as intermittent weakness that will make it difficult for wife to care for him on her own at home. Will benefit from post acute Rehab < 3 hours day.  Wife agrees.  Will continue acute PT.  Nathin Saran M,PT Acute Rehab Services [phone]

## 2023-03-08 NOTE — Progress Notes (Addendum)
RE: Nathan [name]  Date of Birth: 05/01/1946  Date: 03/08/2023  To Whom It May Concern:  Please be advised that the above-named patient will require a short-term nursing home stay - anticipated 30 days or less for rehabilitation and strengthening. The plan is for return home.

## 2023-03-08 NOTE — Progress Notes (Signed)
Mobility Specialist Progress Note:    03/08/23 1207  Mobility  Activity Ambulated with assistance in hallway  Level of Assistance Minimal assist, patient does 75% or more  Assistive Device Front wheel walker  Distance Ambulated (ft) 300 ft  Activity Response Tolerated well  Mobility Referral Yes  $Mobility charge 1 Mobility  Mobility Specialist Start Time (ACUTE ONLY) 1132  Mobility Specialist Stop Time (ACUTE ONLY) 1153  Mobility Specialist Time Calculation (min) (ACUTE ONLY) 21 min   Received pt in bed having no complaints and agreeable to mobility. Pt was asymptomatic throughout ambulation and returned to room w/o fault. Left in bed w/ call bell in reach and all needs met.   Thompson Grayer Mobility Specialist  Please contact vis Secure Chat or  Rehab Office [phone]

## 2023-03-09 DIAGNOSIS — E43 Unspecified severe protein-calorie malnutrition: Secondary | ICD-10-CM | POA: Insufficient documentation

## 2023-03-09 DIAGNOSIS — I214 Non-ST elevation (NSTEMI) myocardial infarction: Secondary | ICD-10-CM | POA: Diagnosis not present

## 2023-03-09 LAB — CBC
HCT: 26.6 % — ABNORMAL LOW (ref 39.0–52.0)
Hemoglobin: 8.8 g/dL — ABNORMAL LOW (ref 13.0–17.0)
MCH: 29.7 pg (ref 26.0–34.0)
MCHC: 33.1 g/dL (ref 30.0–36.0)
MCV: 89.9 fL (ref 80.0–100.0)
Platelets: 206 10*3/uL (ref 150–400)
RBC: 2.96 MIL/uL — ABNORMAL LOW (ref 4.22–5.81)
RDW: 21.4 % — ABNORMAL HIGH (ref 11.5–15.5)
WBC: 12.9 10*3/uL — ABNORMAL HIGH (ref 4.0–10.5)
nRBC: 0.2 % (ref 0.0–0.2)

## 2023-03-09 LAB — BASIC METABOLIC PANEL
Anion gap: 11 (ref 5–15)
BUN: 30 mg/dL — ABNORMAL HIGH (ref 8–23)
CO2: 21 mmol/L — ABNORMAL LOW (ref 22–32)
Calcium: 8.7 mg/dL — ABNORMAL LOW (ref 8.9–10.3)
Chloride: 107 mmol/L (ref 98–111)
Creatinine, Ser: 1.36 mg/dL — ABNORMAL HIGH (ref 0.61–1.24)
GFR, Estimated: 54 mL/min — ABNORMAL LOW (ref >60)
Glucose, Bld: 200 mg/dL — ABNORMAL HIGH (ref 70–99)
Potassium: 3.6 mmol/L (ref 3.5–5.1)
Sodium: 139 mmol/L (ref 135–145)

## 2023-03-09 LAB — GLUCOSE, CAPILLARY
Glucose-Capillary: 197 mg/dL — ABNORMAL HIGH (ref 70–99)
Glucose-Capillary: 204 mg/dL — ABNORMAL HIGH (ref 70–99)
Glucose-Capillary: 150 mg/dL — ABNORMAL HIGH (ref 70–99)
Glucose-Capillary: 175 mg/dL — ABNORMAL HIGH (ref 70–99)
Glucose-Capillary: 162 mg/dL — ABNORMAL HIGH (ref 70–99)

## 2023-03-09 MED ORDER — SACCHAROMYCES BOULARDII 250 MG PO CAPS
250.0000 mg | ORAL_CAPSULE | Freq: Two times a day (BID) | ORAL | Status: DC
  Administered 2023-03-09 – 2023-03-13 (×9): 250 mg via ORAL
  Filled 2023-03-09 (×10): qty 1

## 2023-03-09 MED ORDER — INSULIN GLARGINE-YFGN 100 UNIT/ML ~~LOC~~ SOLN
10.0000 [IU] | Freq: Every day | SUBCUTANEOUS | Status: DC
  Administered 2023-03-09 – 2023-03-13 (×5): 10 [IU] via SUBCUTANEOUS
  Filled 2023-03-09 (×5): qty 0.1

## 2023-03-09 MED ORDER — GLUCERNA SHAKE PO LIQD
237.0000 mL | Freq: Three times a day (TID) | ORAL | Status: DC
  Administered 2023-03-09 – 2023-03-13 (×12): 237 mL via ORAL

## 2023-03-09 NOTE — Progress Notes (Signed)
Mobility Specialist Progress Note:    03/09/23 1553  Mobility  Activity Ambulated with assistance in hallway  Level of Assistance Minimal assist, patient does 75% or more  Assistive Device Front wheel walker  Distance Ambulated (ft) 300 ft  Activity Response Tolerated well  Mobility Referral Yes  $Mobility charge 1 Mobility  Mobility Specialist Start Time (ACUTE ONLY) 1515  Mobility Specialist Stop Time (ACUTE ONLY) 1531  Mobility Specialist Time Calculation (min) (ACUTE ONLY) 16 min   Received pt in bed having no complaints and agreeable to mobility. Needed MinA w/ bed mobility and STS. Contact guard during ambulation. Pt was asymptomatic throughout ambulation and returned to room w/o fault. Left in bed w/ call bell in reach and all needs met.   Thompson Grayer Mobility Specialist  Please contact vis Secure Chat or  Rehab Office [phone]

## 2023-03-09 NOTE — TOC Progression Note (Signed)
Transition of Care [facility]) - Progression Note    Patient Details  Name: Nathan [name] MRN: [id number] Date of Birth: 1946-04-28  [facility] of Care [facility]) CM/SW Contact  Delilah Shan, LCSWA Phone Number: 03/09/2023, 2:22 PM  Clinical Narrative:     CSW provided SNF bed offers to patients spouse. Patients spouse accepted SNF bed offer with [facility]. CSW spoke with Wille Celeste with [facility] who confirmed SNF bed offer for patient. Janie informed CSW that patient will need to be off telesitter for 24 to 48 hours before can dc over. Patients passr was approved [id number]. CSW added passr number to patients FL2. CSW will continue to follow.  Expected Discharge Plan: Skilled Nursing Facility Barriers to Discharge: Continued Medical Work up  Expected Discharge Plan and Services In-house Referral: Clinical Social Work Discharge Planning Services: CM Consult   Living arrangements for the past 2 months: Single Family Home                 DME Arranged: Walker rolling DME Agency: [employer] Date DME Agency Contacted: 02/27/23 Time DME Agency Contacted: 1302 Representative spoke with at DME Agency: Ian Malkin HH Arranged: PT HH Agency: [facility] Date [facility] Agency Contacted: 02/27/23 Time HH Agency Contacted: 1302 Representative spoke with at [facility] Agency: Tresa Endo   Social Determinants of Health (SDOH) Interventions SDOH Screenings   Food Insecurity: No Food Insecurity (02/25/2023)  Housing: Low Risk  (02/25/2023)  Transportation Needs: No Transportation Needs (02/25/2023)  Utilities: Not At Risk (02/25/2023)  Tobacco Use: Low Risk  (02/27/2023)    Readmission Risk Interventions    02/22/2023    2:58 PM  Readmission Risk Prevention Plan  Transportation Screening Complete  HRI or Home Care Consult Complete  Social Work Consult for Recovery Care Planning/Counseling Complete  Palliative Care Screening Not Applicable  Medication Review Oceanographer) Referral to Pharmacy

## 2023-03-09 NOTE — Progress Notes (Addendum)
Nutrition Brief Note  Visited patient at bedside who reports he has been eating better.   RD briefly informed patient of ONS changed from Ensure Plus High Protein po BID, each supplement provides 350 kcal and 20 grams of protein, to Glucerna Shake po TID, each supplement provides 220 kcal and 10 grams of protein, for better CBG management since they were increasing to the 200s.   He is willing to try it. Will continue to monitor glucose.   Leodis Rains, RDN, LDN  Clinical Nutrition

## 2023-03-09 NOTE — Progress Notes (Signed)
Heart Failure Navigator Progress Note  Assessed for Heart & Vascular TOC clinic readiness.  Patient does not meet criteria due to persistent delirium and discharge to SNF.   Navigator will sign off at this time.  Daleon Willinger,RN, BSN,MSN Heart Failure Nurse Navigator. Contact by secure chat only.

## 2023-03-09 NOTE — Plan of Care (Signed)
  Problem: Education: Goal: Understanding of cardiac disease, CV risk reduction, and recovery process will improve Outcome: Progressing Goal: Individualized Educational Video(s) Outcome: Progressing   Problem: Activity: Goal: Ability to tolerate increased activity will improve Outcome: Progressing   Problem: Cardiac: Goal: Ability to achieve and maintain adequate cardiovascular perfusion will improve Outcome: Progressing   Problem: Health Behavior/Discharge Planning: Goal: Ability to safely manage health-related needs after discharge will improve Outcome: Progressing   Problem: Education: Goal: Ability to describe self-care measures that may prevent or decrease complications (Diabetes Survival Skills Education) will improve Outcome: Progressing Goal: Individualized Educational Video(s) Outcome: Progressing   Problem: Coping: Goal: Ability to adjust to condition or change in health will improve Outcome: Progressing   Problem: Fluid Volume: Goal: Ability to maintain a balanced intake and output will improve Outcome: Progressing   Problem: Health Behavior/Discharge Planning: Goal: Ability to identify and utilize available resources and services will improve Outcome: Progressing Goal: Ability to manage health-related needs will improve Outcome: Progressing   Problem: Metabolic: Goal: Ability to maintain appropriate glucose levels will improve Outcome: Progressing   Problem: Nutritional: Goal: Maintenance of adequate nutrition will improve Outcome: Progressing Goal: Progress toward achieving an optimal weight will improve Outcome: Progressing   Problem: Skin Integrity: Goal: Risk for impaired skin integrity will decrease Outcome: Progressing   Problem: Tissue Perfusion: Goal: Adequacy of tissue perfusion will improve Outcome: Progressing   Problem: Education: Goal: Understanding of CV disease, CV risk reduction, and recovery process will improve Outcome:  Progressing Goal: Individualized Educational Video(s) Outcome: Progressing   Problem: Activity: Goal: Ability to return to baseline activity level will improve Outcome: Progressing   Problem: Cardiovascular: Goal: Ability to achieve and maintain adequate cardiovascular perfusion will improve Outcome: Progressing Goal: Vascular access site(s) Level 0-1 will be maintained Outcome: Progressing   Problem: Health Behavior/Discharge Planning: Goal: Ability to safely manage health-related needs after discharge will improve Outcome: Progressing   Problem: Education: Goal: Knowledge of General Education information will improve Description: Including pain rating scale, medication(s)/side effects and non-pharmacologic comfort measures Outcome: Progressing   Problem: Health Behavior/Discharge Planning: Goal: Ability to manage health-related needs will improve Outcome: Progressing   Problem: Clinical Measurements: Goal: Ability to maintain clinical measurements within normal limits will improve Outcome: Progressing Goal: Will remain free from infection Outcome: Progressing Goal: Diagnostic test results will improve Outcome: Progressing Goal: Respiratory complications will improve Outcome: Progressing Goal: Cardiovascular complication will be avoided Outcome: Progressing   Problem: Activity: Goal: Risk for activity intolerance will decrease Outcome: Progressing   Problem: Nutrition: Goal: Adequate nutrition will be maintained Outcome: Progressing   Problem: Coping: Goal: Level of anxiety will decrease Outcome: Progressing   Problem: Elimination: Goal: Will not experience complications related to bowel motility Outcome: Progressing Goal: Will not experience complications related to urinary retention Outcome: Progressing   Problem: Pain Managment: Goal: General experience of comfort will improve Outcome: Progressing   Problem: Safety: Goal: Ability to remain free from  injury will improve Outcome: Progressing   Problem: Skin Integrity: Goal: Risk for impaired skin integrity will decrease Outcome: Progressing   Problem: Fluid Volume: Goal: Hemodynamic stability will improve Outcome: Progressing   Problem: Clinical Measurements: Goal: Diagnostic test results will improve Outcome: Progressing Goal: Signs and symptoms of infection will decrease Outcome: Progressing   Problem: Respiratory: Goal: Ability to maintain adequate ventilation will improve Outcome: Progressing   Problem: Safety: Goal: Violent Restraint(s) Outcome: Progressing

## 2023-03-09 NOTE — Progress Notes (Addendum)
PROGRESS NOTE    Lear Martinetti  NWG:[id number] DOB: 1945/11/09 DOA: 02/20/2023 PCP: Garlan Fillers, MD  77 y.o. M with DM, HTN, CAD s/p CABG and AVR, hx OSA, and history severe necrotizing fasciitis few years ago who presented with chest discomfort. High sensitive troponin 12, 31, 4,527, 6,445   -placed on nitroglycerin drip for pain control and heparin drip for anticoagulation.  8/14 echo: EF 20-25% 8/20 echo: EF 25-30% with RWMA, no thrombus; AV repair noted   8/13: Admitted on heparin to Cardiology for NSTEMI --> evening of HD1, developed respiratory distress and rales requiring BiPAP and ICU transfer 8/14: Developed delirium requiring Precedex; Transaminitis noted 8/15: Cr peaks at 2.15 8/16: Weaned off Precedex; new onset Atrial fibrillation noted 8/17: Leukocytosis noted 8/18: Antibiotics started for Pneumonia based on CXR 8/20: Hgb down to 8s 8/21: ID consulted; CT C/A/P shows abnormal gallbladder, new hematoma; heparin stopped 8/22: IR and Gen Surg consulted; HIDA confirms cholecystitis; transfused 2 units; Perc chole drain placed 8/23: Worsening agitation/delirium 8/24 further droop in Hgb down to 6,9, hypotension. Stopped heparin drip and ordered PRBC transfusion x1.  8/25 mentation has improved but not back to baseline, continues to use intermittently soft wrist restrains.  8/26 encephalopathy slowly improving, off restrains and improved po intake.  8/27 patient pulled his biliary drain and will need to be replaced per IR. Antibiotic therapy changed to oral Augmentin. Encephalopathy is improving, continue off restrains.  8/28: Drain exchanged in IR   Subjective: -Feels okay overall, still with some confusion but overall improving, denies dyspnea  Assessment and Plan:  NSTEMI (non-ST elevated myocardial infarction) (HCC) Coronary artery disease sp CABG 2010 Echocardiogram with new reduction on LV systolic function, to 20 to 25%, global hypokinesis.  -Followed by  cardiology, no plans for invasive workup in the setting of delirium renal failure frailty etc. -conservative management -Continue aspirin, statin, isosorbide and metoprolol  Acute on chronic systolic CHF  Echo w/ EF 20 to 25%, with global hypokinesis, mild dilatated LV cavity, mild LVH, RV systolic function with moderate reduction -GDMT was limited by hypotension and AKI -Repeat echo 03/06/2023 noted improvement in EF to 35-40% -Volume status appears stable, blood pressure improved, started Jardiance yesterday  Acute hypoxemic respiratory failure due to acute cardiogenic pulmonary edema. Required non invasive mechanical ventilation on admission.  -resolved  Acute metabolic encephalopathy -Delirium in the setting of cholecystitis, pain and prolonged hospitalization -PRN haloperidol and Quetiapine 25 mg po daily.  -Mental status slowly improving -Discontinue TeleSitter today, discharge planning, SNF  Acute cholecystitis -Imaging 8/21 showed signs of gallstones and cholecystitis, confirmed with HIDA. -Perc tube placed 8/22 -Plan for 6 weeks drain, follow up in IR clinic -8/27 transition to oral Augmentin, will need to complete 3 weeks total, and final duration pending repeat imaging.  -8/27 drain pulled by patient, drain exchanged 8/28  Acute posthemorrhagic anemia 8/21 CT with incompletely imaged heterogeneous mixed hyper and hypodensity in the region of the right adductor musculature measuring 9,0 x 8,6 cm,  likely hematoma.  -sp 3 units PRBC last 8/24 -hb stable, continue to trend  Acute kidney injury on CKD stage 3a, GFR 45-59 ml/min (HCC) AKI, hypokalemia.  -Improving  Dyslipidemia Continue with statin therapy.   Paroxysmal atrial fibrillation (HCC) New onset.  CHA2DS2-Vasc 6 (age, HTN, CHF, DM, vascular disease).  Continue rate control with metoprolol, holding anticoagulation due to acute bleed (left thigh hematoma)   S/P AVR  Type 2 diabetes mellitus -CBGs poorly  controlled, add Semglee, continue [employer]  DVT prophylaxis: SCDS, anticoag held w/ Large hematoma and anemia Code Status: DNR Family Communication: None present, called and updated spouse Disposition Plan: SNF in 1-2days  Consultants:    Procedures:   Antimicrobials:    Objective: Vitals:   03/08/23 2352 03/09/23 0247 03/09/23 0808 03/09/23 1208  BP: 130/74 134/76 116/76 133/64  Pulse: 90 95 94   Resp: 18 17 14 17   Temp: 98.3 F (36.8 C) 98.6 F (37 C) 98 F (36.7 C) 98.1 F (36.7 C)  TempSrc: Oral Oral Oral Oral  SpO2: 98% 96% 98%   Weight:  65.1 kg    Height:        Intake/Output Summary (Last 24 hours) at 03/09/2023 1241 Last data filed at 03/09/2023 0800 Gross per 24 hour  Intake 437 ml  Output 1400 ml  Net -963 ml   Filed Weights   03/06/23 0426 03/07/23 1127 03/09/23 0247  Weight: 67.3 kg 66 kg 65.1 kg    Examination:  Gen: Awake, Alert, Oriented X 2,  HEENT: no JVD Lungs: Good air movement bilaterally, CTAB CVS: S1S2/RRR Abd: soft, Non tender, non distended, BS present Extremities: No edema Skin: no new rashes on exposed skin    Data Reviewed:   CBC: Recent Labs  Lab 03/04/23 0746 03/04/23 1810 03/05/23 0341 03/06/23 0500 03/07/23 0538 03/08/23 1038 03/09/23 0448  WBC 21.1*  --  20.7* 15.2*  --  11.9* 12.9*  HGB 6.9*   < > 8.8* 8.2* 8.9* 8.2* 8.8*  HCT 19.5*   < > 25.4* 24.5* 27.2* 25.6* 26.6*  MCV 86.7  --  84.4 88.1  --  91.8 89.9  PLT 158  --  173 177  --  188 206   < > = values in this interval not displayed.   Basic Metabolic Panel: Recent Labs  Lab 03/05/23 0341 03/06/23 0500 03/07/23 0538 03/08/23 1038 03/09/23 0448  NA 139 141 140 139 139  K 3.3* 4.2 3.9 3.7 3.6  CL 108 112* 109 109 107  CO2 19* 19* 19* 19* 21*  GLUCOSE 119* 146* 213* 228* 200*  BUN 65* 52* 40* 31* 30*  CREATININE 2.54* 2.05* 1.77* 1.42* 1.36*  CALCIUM 8.5* 8.4* 8.6* 8.4* 8.7*  PHOS  --   --  2.5  --   --    GFR: Estimated Creatinine  Clearance: 41.9 mL/min (A) (by C-G formula based on SCr of 1.36 mg/dL (H)). Liver Function Tests: Recent Labs  Lab 03/03/23 0418 03/07/23 0538  AST 69*  --   ALT 47*  --   ALKPHOS 61  --   BILITOT 1.4*  --   PROT 5.2*  --   ALBUMIN 2.0* 2.0*   No results for input(s): "LIPASE", "AMYLASE" in the last 168 hours. No results for input(s): "AMMONIA" in the last 168 hours. Coagulation Profile: Recent Labs  Lab 03/03/23 0418  INR 1.3*   Cardiac Enzymes: No results for input(s): "CKTOTAL", "CKMB", "CKMBINDEX", "TROPONINI" in the last 168 hours. BNP (last 3 results) No results for input(s): "PROBNP" in the last 8760 hours. HbA1C: No results for input(s): "HGBA1C" in the last 72 hours. CBG: Recent Labs  Lab 03/08/23 2005 03/08/23 2351 03/09/23 0402 03/09/23 0743 03/09/23 1201  GLUCAP 214* 259* 197* 204* 150*   Lipid Profile: No results for input(s): "CHOL", "HDL", "LDLCALC", "TRIG", "CHOLHDL", "LDLDIRECT" in the last 72 hours. Thyroid Function Tests: No results for input(s): "TSH", "T4TOTAL", "FREET4", "T3FREE", "THYROIDAB" in the last 72 hours. Anemia Panel: No results for input(s): "  VITAMINB12", "FOLATE", "FERRITIN", "TIBC", "IRON", "RETICCTPCT" in the last 72 hours. Urine analysis:    Component Value Date/Time   COLORURINE YELLOW 02/28/2023 0951   APPEARANCEUR HAZY (A) 02/28/2023 0951   LABSPEC 1.016 02/28/2023 0951   PHURINE 5.0 02/28/2023 0951   GLUCOSEU NEGATIVE 02/28/2023 0951   HGBUR MODERATE (A) 02/28/2023 0951   BILIRUBINUR NEGATIVE 02/28/2023 0951   KETONESUR 20 (A) 02/28/2023 0951   PROTEINUR NEGATIVE 02/28/2023 0951   UROBILINOGEN 0.2 10/16/2008 1546   NITRITE NEGATIVE 02/28/2023 0951   LEUKOCYTESUR NEGATIVE 02/28/2023 0951   Sepsis Labs: @LABRCNTIP (procalcitonin:4,lacticidven:4)  ) Recent Results (from the past 240 hour(s))  Culture, blood (x 2)     Status: None   Collection Time: 02/28/23  4:44 PM   Specimen: BLOOD LEFT ARM  Result Value Ref  Range Status   Specimen Description BLOOD LEFT ARM  Final   Special Requests   Final    BOTTLES DRAWN AEROBIC AND ANAEROBIC Blood Culture adequate volume   Culture   Final    NO GROWTH 5 DAYS Performed at [facility] Lab, [address] [place], [place] [address]    Report Status 03/05/2023 FINAL  Final  Culture, blood (x 2)     Status: None   Collection Time: 02/28/23  4:58 PM   Specimen: BLOOD LEFT ARM  Result Value Ref Range Status   Specimen Description BLOOD LEFT ARM  Final   Special Requests   Final    BOTTLES DRAWN AEROBIC AND ANAEROBIC Blood Culture results may not be optimal due to an excessive volume of blood received in culture bottles   Culture   Final    NO GROWTH 5 DAYS Performed at [facility] Lab, [address], [place], [place] [address]    Report Status 03/05/2023 FINAL  Final  Respiratory (~20 pathogens) panel by PCR     Status: None   Collection Time: 02/28/23  6:10 PM   Specimen: Nasopharyngeal Swab; Respiratory  Result Value Ref Range Status   Adenovirus NOT DETECTED NOT DETECTED Final   Coronavirus 229E NOT DETECTED NOT DETECTED Final    Comment: (NOTE) The Coronavirus on the Respiratory Panel, DOES NOT test for the novel  Coronavirus (2019 nCoV)    Coronavirus HKU1 NOT DETECTED NOT DETECTED Final   Coronavirus NL63 NOT DETECTED NOT DETECTED Final   Coronavirus OC43 NOT DETECTED NOT DETECTED Final   Metapneumovirus NOT DETECTED NOT DETECTED Final   Rhinovirus / Enterovirus NOT DETECTED NOT DETECTED Final   Influenza A NOT DETECTED NOT DETECTED Final   Influenza B NOT DETECTED NOT DETECTED Final   Parainfluenza Virus 1 NOT DETECTED NOT DETECTED Final   Parainfluenza Virus 2 NOT DETECTED NOT DETECTED Final   Parainfluenza Virus 3 NOT DETECTED NOT DETECTED Final   Parainfluenza Virus 4 NOT DETECTED NOT DETECTED Final   Respiratory Syncytial Virus NOT DETECTED NOT DETECTED Final   Bordetella pertussis NOT DETECTED NOT DETECTED Final   Bordetella  Parapertussis NOT DETECTED NOT DETECTED Final   Chlamydophila pneumoniae NOT DETECTED NOT DETECTED Final   Mycoplasma pneumoniae NOT DETECTED NOT DETECTED Final    Comment: Performed at [facility] Lab, [address] [place], [place] [address]  SARS Coronavirus 2 by RT PCR (hospital order, performed in [facility] hospital lab) *cepheid single result test* Anterior Nasal Swab     Status: None   Collection Time: 03/01/23 11:19 AM   Specimen: Anterior Nasal Swab  Result Value Ref Range Status   SARS  Coronavirus 2 by RT PCR NEGATIVE NEGATIVE Final    Comment: Performed at [facility] Lab, [address], [place], [place] [address]  Aerobic/Anaerobic Culture w Gram Stain (surgical/deep wound)     Status: None   Collection Time: 03/02/23  5:03 PM   Specimen: Wound; Bile  Result Value Ref Range Status   Specimen Description WOUND  Final   Special Requests bile, gallbladder  Final   Gram Stain NO WBC SEEN FEW GRAM NEGATIVE RODS   Final   Culture   Final    MODERATE ESCHERICHIA COLI NO ANAEROBES ISOLATED Performed at [facility] Lab, [address], [place], [place] [address]    Report Status 03/07/2023 FINAL  Final   Organism ID, Bacteria ESCHERICHIA COLI  Final      Susceptibility   Escherichia coli - MIC*    AMPICILLIN 16 INTERMEDIATE Intermediate     CEFEPIME <=0.12 SENSITIVE Sensitive     CEFTAZIDIME <=1 SENSITIVE Sensitive     CEFTRIAXONE <=0.25 SENSITIVE Sensitive     CIPROFLOXACIN <=0.25 SENSITIVE Sensitive     GENTAMICIN <=1 SENSITIVE Sensitive     IMIPENEM <=0.25 SENSITIVE Sensitive     TRIMETH/SULFA <=20 SENSITIVE Sensitive     AMPICILLIN/SULBACTAM 4 SENSITIVE Sensitive     PIP/TAZO <=4 SENSITIVE Sensitive     * MODERATE ESCHERICHIA COLI     Radiology Studies: IR EXCHANGE BILIARY DRAIN  Result Date: 03/08/2023 INDICATION: Malfunctioning drainage tube. History of acute cholecystitis, post ultrasound fluoroscopic guided cholecystostomy tube placement on  03/02/2023. EXAM: FLUOROSCOPIC GUIDED CHOLECYSTOSTOMY TUBE EXCHANGE COMPARISON:  IR fluoroscopy, 03/02/2023.  CT CAP, 03/01/2023. MEDICATIONS: None ANESTHESIA/SEDATION: Local anesthetic was administered. CONTRAST:  10mL OMNIPAQUE IOHEXOL 300 MG/ML SOLN - administered into the gallbladder lumen. FLUOROSCOPY TIME:  Fluoroscopic dose; 16 mGy COMPLICATIONS: None immediate. PROCEDURE: The patient was positioned supine on the fluoroscopy table. The external portion of the existing cholecystostomy tube as well as the surrounding skin was prepped and draped in usual sterile fashion. A time-out was performed prior to the initiation of the procedure. A preprocedural spot fluoroscopic image was obtained of the right upper abdominal quadrant existing cholecystostomy tube. The skin surrounding the cholecystostomy tube was anesthetized with 1% lidocaine. The external portion of the cholecystostomy tube was cut and cannulated with a short Amplatz wire which was advanced through the tube and coiled within the gallbladder lumen Next, under intermittent fluoroscopic guidance, the existing 10 [place] cholecystostomy tube was exchanged for a new, 10 Fr cholecystostomy tube which was repositioned into the more central aspect of the gallbladder lumen. Contrast injection confirms appropriate positioning and functionality of the cholecystomy tube. The cholecystostomy tube was flushed with a small amount of saline and reconnected to a gravity bag. The cholecystostomy tube was secured with an interrupted suture and a Stat Lock device. A dressing was applied. The patient tolerated the procedure well without immediate postprocedural complication. FINDINGS: *Preprocedural spot fluoroscopic image demonstrates unchanged positioning of cholecystostomy tube with end coiled and locked over the expected location of the fundus of the gallbladder *Antegrade cholangiogram demonstrating a patent cystic duct and no extrahepatic biliary ductal dilatation.  Question small filling defect at the ampulla, suspicious for choledocholithiasis. See key image. *After fluoroscopic guided exchange, the new, 10 Fr cholecystostomy tube is more ideally positioned with end coiled and locked within the central aspect of the gallbladder lumen. *Post exchange cholangiogram demonstrates appropriate positioning and functionality of the new cholecystostomy tube. IMPRESSION: Successful fluoroscopic guided exchange, and repositioning for a new 10  Fr cholecystostomy tube. Question small filling defect at the ampulla, suspicious for choledocholithiasis. No extrahepatic biliary ductal dilatation. PLAN: The patient will return to Vascular Interventional Radiology (VIR) for routine drainage catheter evaluation and exchange in 6-8 weeks. Roanna Banning, MD [facility] Radiology Electronically Signed   By: Roanna Banning M.D.   On: 03/08/2023 09:25     Scheduled Meds:  allopurinol  300 mg Oral Daily   amoxicillin-clavulanate  1 tablet Oral Q12H   vitamin C  1,000 mg Oral Daily   atorvastatin  80 mg Oral Daily   docusate sodium  100 mg Oral BID   empagliflozin  10 mg Oral Daily   feeding supplement (GLUCERNA SHAKE)  237 mL Oral TID BM   insulin aspart  0-9 Units Subcutaneous TID WC   latanoprost  1 drop Both Eyes QHS   melatonin  5 mg Oral QHS   metoprolol succinate  50 mg Oral Daily   multivitamin with minerals  1 tablet Oral Daily   polyethylene glycol  17 g Oral Daily   QUEtiapine  25 mg Oral QHS   sodium chloride flush  5 mL Intracatheter Q8H   Continuous Infusions:  sodium chloride Stopped (02/24/23 0824)     LOS: 16 days    Time spent:    Zannie Cove, MD [facility]   03/09/2023, 12:41 PM

## 2023-03-09 NOTE — Progress Notes (Signed)
Patient was restless, agitated, pulling the cords and trying to get out of bed. He states "there are people trying to break in, call the police". Haldol PRN given. Will report any changes.

## 2023-03-09 NOTE — Inpatient Diabetes Management (Signed)
Inpatient Diabetes Program Recommendations  AACE/ADA: New Consensus Statement on Inpatient Glycemic Control   Target Ranges:  Prepandial:   less than 140 mg/dL      Peak postprandial:   less than 180 mg/dL (1-2 hours)      Critically ill patients:  140 - 180 mg/dL    Latest Reference Range & Units 03/08/23 08:16 03/08/23 11:30 03/08/23 16:16 03/08/23 20:05 03/08/23 23:51 03/09/23 04:02 03/09/23 07:43  Glucose-Capillary 70 - 99 mg/dL 865 (H) 784 (H) 696 (H) 214 (H) 259 (H) 197 (H) 204 (H)   Review of Glycemic Control  Diabetes history: DM2 Outpatient Diabetes medications: Glipizide 5 mg daily Current orders for Inpatient glycemic control: Jardiance 10 mg daily, Novolog 0-9 units TID with meals; Ensure Enlive BID  Inpatient Diabetes Program Recommendations:    Supplements: Noted glucose has been rising and noted to be in the 200's for past 16 hours. If appropriate for patient needs, may want to consider discontinuing Ensure Enlive and use Ensure Max as supplement since it has less carbohydrates.   Thanks, Orlando Penner, RN, MSN, CDCES Diabetes Coordinator Inpatient Diabetes Program [phone] (Team Pager from 8am to 5pm)

## 2023-03-09 NOTE — Plan of Care (Signed)
  Problem: Cardiac: Goal: Ability to achieve and maintain adequate cardiovascular perfusion will improve Outcome: Progressing   Problem: Health Behavior/Discharge Planning: Goal: Ability to safely manage health-related needs after discharge will improve Outcome: Progressing   Problem: Health Behavior/Discharge Planning: Goal: Ability to manage health-related needs will improve Outcome: Progressing   Problem: Clinical Measurements: Goal: Ability to maintain clinical measurements within normal limits will improve Outcome: Progressing Goal: Will remain free from infection Outcome: Progressing Goal: Diagnostic test results will improve Outcome: Progressing Goal: Respiratory complications will improve Outcome: Progressing Goal: Cardiovascular complication will be avoided Outcome: Progressing   Problem: Activity: Goal: Risk for activity intolerance will decrease Outcome: Progressing   Problem: Safety: Goal: Ability to remain free from injury will improve Outcome: Progressing   Problem: Skin Integrity: Goal: Risk for impaired skin integrity will decrease Outcome: Progressing

## 2023-03-09 NOTE — Progress Notes (Signed)
Physical Therapy Treatment Patient Details Name: Nathan [name] MRN: [id number] DOB: January 22, 1946 Today's Date: 03/09/2023   History of Present Illness Pt is 77 yo male presented on 02/20/23 with chest pain and found to have NSTEMI.  Pt was scheduled to undergo cardiac cath but developed overload/CHF requiring transfer to ICU. Pt found to be septic with cholecystitis with MD placing chole drain on 8/22.  Cardiac cath will be postponed until sepsis improved.  PMH: CAD s/p CABGx3 in 2010, aortic valve replacement, DM, HTN, HLD, MI, OSA    PT Comments  Pt admitted with above diagnosis. Pt met 0/5 goals set due to multiple medical issues as well as fluctuations in cognitive status. Revised goals today.  Pt needs continued therapy to maintain endurance and continue to progress toward goals. Continue to recommend post acute rehab < 3 hours therapy day.  Pt currently with functional limitations due to the deficits listed below (see PT Problem List). Pt will benefit from acute skilled PT to increase their independence and safety with mobility to allow discharge.       If plan is discharge home, recommend the following: A little help with walking and/or transfers;A little help with bathing/dressing/bathroom;Assistance with cooking/housework;Help with stairs or ramp for entrance   Can travel by private vehicle        Equipment Recommendations  Rolling walker (2 wheels)    Recommendations for Other Services       Precautions / Restrictions Precautions Precautions: Fall Precaution Comments: Chole drain right side Restrictions Weight Bearing Restrictions: No     Mobility  Bed Mobility Overal bed mobility: Needs Assistance Bed Mobility: Supine to Sit     Supine to sit: Min assist Sit to supine: Min assist   General bed mobility comments: LE Assist needed to come to EOB and to lie back down.  Pt with difficulty processing commands.    Transfers Overall transfer level: Needs  assistance Equipment used: Rolling walker (2 wheels) Transfers: Sit to/from Stand Sit to Stand: Min assist, From elevated surface           General transfer comment: Needed assist to stand to RW and cues for hand placement. Pt rocking and used momentum to stand.    Ambulation/Gait Ambulation/Gait assistance: Min assist Gait Distance (Feet): 150 Feet Assistive device: Rolling walker (2 wheels) Gait Pattern/deviations: Step-through pattern, Decreased stride length, Trunk flexed, Wide base of support, Drifts right/left, Leaning posteriorly Gait velocity: decreased Gait velocity interpretation: <1.31 ft/sec, indicative of household ambulator   General Gait Details: Initially, pt was Min assist  to ambulate with RW for safety as pt tends to need cues to stay close to RW and for upright stance as he leans forward as he fatigues. Pt HR initially 87 bpm and incr to 119 bpm.  Pt continues to need cues as he moves impulsively at times.   Stairs             Wheelchair Mobility     Tilt Bed    Modified Rankin (Stroke Patients Only)       Balance Overall balance assessment: Needs assistance Sitting-balance support: No upper extremity supported Sitting balance-Leahy Scale: Good     Standing balance support: Bilateral upper extremity supported, Reliant on assistive device for balance Standing balance-Leahy Scale: Poor Standing balance comment: Requiring RW and min assist static stance, mod assist dynamically  Cognition Arousal: Alert Behavior During Therapy: Flat affect Overall Cognitive Status: No family/caregiver present to determine baseline cognitive functioning Area of Impairment: Problem solving                             Problem Solving: Slow processing General Comments: Pt was oriented to person, place, time, and situation.        Exercises General Exercises - Lower Extremity Long Arc Quad: AROM, Both, 10  reps, Seated Hip Flexion/Marching: AROM, Both, 10 reps, Seated    General Comments        Pertinent Vitals/Pain Pain Assessment Pain Assessment: Faces Faces Pain Scale: Hurts little more Breathing: normal Negative Vocalization: occasional moan/groan, low speech, negative/disapproving quality Facial Expression: sad, frightened, frown Body Language: relaxed Consolability: no need to console PAINAD Score: 2 Pain Location: Bil LE Pain Descriptors / Indicators: Discomfort, Grimacing Pain Intervention(s): Limited activity within patient's tolerance, Monitored during session, Repositioned    Home Living                          Prior Function            PT Goals (current goals can now be found in the care plan section) Acute Rehab PT Goals Patient Stated Goal: return home PT Goal Formulation: With patient Time For Goal Achievement: 03/23/23 Potential to Achieve Goals: Good Progress towards PT goals: Progressing toward goals    Frequency    Min 1X/week      PT Plan      Co-evaluation              AM-PAC PT "6 Clicks" Mobility   Outcome Measure  Help needed turning from your back to your side while in a flat bed without using bedrails?: A Little Help needed moving from lying on your back to sitting on the side of a flat bed without using bedrails?: A Little Help needed moving to and from a bed to a chair (including a wheelchair)?: A Little Help needed standing up from a chair using your arms (e.g., wheelchair or bedside chair)?: A Little Help needed to walk in hospital room?: A Lot Help needed climbing 3-5 steps with a railing? : A Lot 6 Click Score: 16    End of Session Equipment Utilized During Treatment: Gait belt Activity Tolerance: Patient limited by fatigue Patient left: with call bell/phone within reach;in bed;with bed alarm set (for safety) Nurse Communication: Mobility status PT Visit Diagnosis: Other abnormalities of gait and mobility  (R26.89);Muscle weakness (generalized) (M62.81)     Time: 0630-1601 PT Time Calculation (min) (ACUTE ONLY): 20 min  Charges:    $Gait Training: 8-22 mins PT General Charges $$ ACUTE PT VISIT: 1 Visit                     Nathan [name],PT Acute Rehab Services [phone]    Nathan [name] 03/09/2023, 2:16 PM

## 2023-03-10 DIAGNOSIS — I214 Non-ST elevation (NSTEMI) myocardial infarction: Secondary | ICD-10-CM | POA: Diagnosis not present

## 2023-03-10 DIAGNOSIS — K810 Acute cholecystitis: Secondary | ICD-10-CM | POA: Diagnosis not present

## 2023-03-10 DIAGNOSIS — E43 Unspecified severe protein-calorie malnutrition: Secondary | ICD-10-CM

## 2023-03-10 DIAGNOSIS — E785 Hyperlipidemia, unspecified: Secondary | ICD-10-CM | POA: Diagnosis not present

## 2023-03-10 DIAGNOSIS — I480 Paroxysmal atrial fibrillation: Secondary | ICD-10-CM | POA: Diagnosis not present

## 2023-03-10 LAB — GLUCOSE, CAPILLARY
Glucose-Capillary: 132 mg/dL — ABNORMAL HIGH (ref 70–99)
Glucose-Capillary: 148 mg/dL — ABNORMAL HIGH (ref 70–99)
Glucose-Capillary: 157 mg/dL — ABNORMAL HIGH (ref 70–99)
Glucose-Capillary: 164 mg/dL — ABNORMAL HIGH (ref 70–99)
Glucose-Capillary: 170 mg/dL — ABNORMAL HIGH (ref 70–99)
Glucose-Capillary: 203 mg/dL — ABNORMAL HIGH (ref 70–99)

## 2023-03-10 LAB — BASIC METABOLIC PANEL
Anion gap: 12 (ref 5–15)
BUN: 33 mg/dL — ABNORMAL HIGH (ref 8–23)
CO2: 23 mmol/L (ref 22–32)
Calcium: 8.8 mg/dL — ABNORMAL LOW (ref 8.9–10.3)
Chloride: 106 mmol/L (ref 98–111)
Creatinine, Ser: 1.45 mg/dL — ABNORMAL HIGH (ref 0.61–1.24)
GFR, Estimated: 50 mL/min — ABNORMAL LOW (ref >60)
Glucose, Bld: 157 mg/dL — ABNORMAL HIGH (ref 70–99)
Potassium: 3.3 mmol/L — ABNORMAL LOW (ref 3.5–5.1)
Sodium: 141 mmol/L (ref 135–145)

## 2023-03-10 LAB — CBC
HCT: 27.7 % — ABNORMAL LOW (ref 39.0–52.0)
Hemoglobin: 8.9 g/dL — ABNORMAL LOW (ref 13.0–17.0)
MCH: 30.3 pg (ref 26.0–34.0)
MCHC: 32.1 g/dL (ref 30.0–36.0)
MCV: 94.2 fL (ref 80.0–100.0)
Platelets: 195 10*3/uL (ref 150–400)
RBC: 2.94 MIL/uL — ABNORMAL LOW (ref 4.22–5.81)
RDW: 22 % — ABNORMAL HIGH (ref 11.5–15.5)
WBC: 9.8 10*3/uL (ref 4.0–10.5)
nRBC: 0 % (ref 0.0–0.2)

## 2023-03-10 MED ORDER — POTASSIUM CHLORIDE CRYS ER 20 MEQ PO TBCR
40.0000 meq | EXTENDED_RELEASE_TABLET | Freq: Once | ORAL | Status: AC
  Administered 2023-03-10: 40 meq via ORAL
  Filled 2023-03-10: qty 2

## 2023-03-10 MED ORDER — SPIRONOLACTONE 12.5 MG HALF TABLET
12.5000 mg | ORAL_TABLET | Freq: Every day | ORAL | Status: DC
  Administered 2023-03-10: 12.5 mg via ORAL
  Filled 2023-03-10: qty 1

## 2023-03-10 NOTE — Plan of Care (Signed)
  Problem: Cardiac: Goal: Ability to achieve and maintain adequate cardiovascular perfusion will improve Outcome: Progressing   Problem: Clinical Measurements: Goal: Ability to maintain clinical measurements within normal limits will improve Outcome: Progressing Goal: Will remain free from infection Outcome: Progressing Goal: Diagnostic test results will improve Outcome: Progressing Goal: Respiratory complications will improve Outcome: Progressing Goal: Cardiovascular complication will be avoided Outcome: Progressing   Problem: Nutrition: Goal: Adequate nutrition will be maintained Outcome: Progressing   Problem: Safety: Goal: Ability to remain free from injury will improve Outcome: Progressing   Problem: Skin Integrity: Goal: Risk for impaired skin integrity will decrease Outcome: Progressing

## 2023-03-10 NOTE — Progress Notes (Signed)
Referring Physician(s): Dr. Pilar Grammes  Supervising Physician: Marliss Coots  Patient Status:  [facility] - In-pt  Chief Complaint:  Acute cholecystis s/p cholecystomy tube placemen ton 8.22.24 by IR Attending Dr. Ruthy Dick.  Exchanged and repositioned on 8.28.24  Subjective:  Patient is alert and confused. States that he is unable to reach his wife. Repeating "they told me I have 2 days to live"  Allergies: Shellfish-derived products and Zolpidem tartrate  Medications: Prior to Admission medications   Medication Sig Start Date End Date Taking? Authorizing Provider  allopurinol (ZYLOPRIM) 300 MG tablet Take 300 mg by mouth daily.   Yes [provider]  ascorbic acid (VITAMIN C) 500 MG tablet Take by mouth daily.   Yes [provider]  aspirin 81 MG tablet Take 1 tablet (81 mg total) by mouth daily. Patient taking differently: Take 81 mg by mouth at bedtime. 02/09/16  Yes Jake Bathe, MD  atorvastatin (LIPITOR) 20 MG tablet Take 20 mg by mouth daily. 07/25/15  Yes [provider]  Cholecalciferol (VITAMIN D-3 PO) Take 1 capsule by mouth in the morning and at bedtime.   Yes [provider]  Coenzyme Q10 (COQ10) 100 MG CAPS Take 100 mg by mouth daily.   Yes [provider]  fish oil-omega-3 fatty acids 1000 MG capsule Take by mouth daily.   Yes [provider]  glipiZIDE (GLUCOTROL) 5 MG tablet Take 5 mg by mouth every morning.   Yes [provider]  hydrochlorothiazide (MICROZIDE) 12.5 MG capsule Take 12.5 mg by mouth daily. Reported on 08/20/2015 10/13/10  Yes Cassell Clement, MD  isosorbide mononitrate (IMDUR) 30 MG 24 hr tablet TAKE 1 TABLET (30 MG TOTAL) BY MOUTH DAILY. **DO NOT CRUSH** 07/20/22  Yes Swinyer, Zachary George, NP  metFORMIN (GLUCOPHAGE) 1000 MG tablet Take 1,000 mg by mouth in the morning and at bedtime.   Yes [provider]  metoprolol (LOPRESSOR) 50 MG tablet Take 50 mg by mouth 2 (two) times daily.    Yes [provider]  minoxidil (LONITEN) 10 MG tablet Take 10 mg by mouth daily.  07/20/15  Yes [provider]  VITAMIN A PO Take by mouth daily.   Yes [provider]  acetaminophen (TYLENOL) 500 MG tablet Take 500 mg by mouth daily as needed for moderate pain. Patient not taking: Reported on 02/23/2023    [provider]  ALPRAZolam Prudy Feeler) 0.25 MG tablet Take 0.25 mg by mouth daily as needed for anxiety. Patient not taking: Reported on 02/23/2023    [provider]  amLODipine (NORVASC) 5 MG tablet Take 5 mg by mouth every morning. Patient not taking: Reported on 02/23/2023    [provider]  amoxicillin-clavulanate (AUGMENTIN) 875-125 MG tablet Take 1 tablet by mouth every 12 (twelve) hours. Patient not taking: Reported on 02/23/2023 01/21/23   Evlyn Kanner T, PA-C  Cyanocobalamin (VITAMIN B-12 IJ) Inject 1,000 mcg into the muscle every 30 (thirty) days. Amt/dose is unknown Patient not taking: Reported on 02/23/2023    [provider]  cycloSPORINE (RESTASIS) 0.05 % ophthalmic emulsion Place 1 drop into both eyes 2 (two) times daily. Patient not taking: Reported on 02/23/2023    [provider]  fluorouracil (EFUDEX) 5 % cream Apply topically daily. Patient not taking: Reported on 02/23/2023 06/10/22   [provider]  HYDROcodone-acetaminophen (VICODIN) 5-500 MG per tablet Take 1 tablet by mouth daily as needed for pain.  Patient not taking: Reported on 02/23/2023  [provider]  ketoconazole (NIZORAL) 2 % cream 2 (two) times daily. Patient not taking: Reported on 02/23/2023 06/10/22   [provider]  meloxicam (MOBIC) 15 MG tablet Take 15 mg by mouth as needed for pain. Patient not taking: Reported on 02/23/2023 05/26/22   [provider]  Multiple Vitamin (MULTIVITAMIN) tablet Take 1 tablet by mouth daily. Patient not taking: Reported on 02/23/2023    [provider]   nitroGLYCERIN (NITROSTAT) 0.4 MG SL tablet Place 1 tablet under the tongue as needed. Patient not taking: Reported on 02/23/2023 09/01/16   [provider]  oxyCODONE-acetaminophen (PERCOCET/ROXICET) 5-325 MG tablet Take 1 tablet by mouth daily as needed for moderate pain or severe pain.  Patient not taking: Reported on 02/23/2023    [provider]  trolamine salicylate (ASPERCREME) 10 % cream Apply 1 application topically as needed for muscle pain. Patient not taking: Reported on 02/23/2023    [provider]     Vital Signs: BP (!) 112/52 (BP Location: Right Arm)   Pulse 83   Temp 97.6 F (36.4 C) (Oral)   Resp 16   Ht 5\' 7"  (1.702 m)   Wt 143 lb 4.8 oz (65 kg)   SpO2 95%   BMI 22.44 kg/m   Physical Exam Vitals and nursing note reviewed.  Constitutional:      Appearance: He is well-developed.  HENT:     Head: Normocephalic.  Pulmonary:     Effort: Pulmonary effort is normal.  Abdominal:     Comments: Positive RUQ  drain to gravity bag. Site is unremarkable with no erythema, edema, tenderness, bleeding or drainage noted at exit site. Suture,  stat lock abd binger in place. Dressing is clean dry and intact. 50 ml of  dark brown colored fluid noted in  bulb  gravity bag. Per RN drain is able to be flushed easily.   Musculoskeletal:        General: Normal range of motion.     Cervical back: Normal range of motion.  Skin:    General: Skin is warm and dry.  Neurological:     Mental Status: He is alert. He is disoriented.     Imaging: IR EXCHANGE BILIARY DRAIN  Result Date: 03/08/2023 INDICATION: Malfunctioning drainage tube. History of acute cholecystitis, post ultrasound fluoroscopic guided cholecystostomy tube placement on 03/02/2023. EXAM: FLUOROSCOPIC GUIDED CHOLECYSTOSTOMY TUBE EXCHANGE COMPARISON:  IR fluoroscopy, 03/02/2023.  CT CAP, 03/01/2023. MEDICATIONS: None ANESTHESIA/SEDATION: Local anesthetic was administered. CONTRAST:  10mL OMNIPAQUE  IOHEXOL 300 MG/ML SOLN - administered into the gallbladder lumen. FLUOROSCOPY TIME:  Fluoroscopic dose; 16 mGy COMPLICATIONS: None immediate. PROCEDURE: The patient was positioned supine on the fluoroscopy table. The external portion of the existing cholecystostomy tube as well as the surrounding skin was prepped and draped in usual sterile fashion. A time-out was performed prior to the initiation of the procedure. A preprocedural spot fluoroscopic image was obtained of the right upper abdominal quadrant existing cholecystostomy tube. The skin surrounding the cholecystostomy tube was anesthetized with 1% lidocaine. The external portion of the cholecystostomy tube was cut and cannulated with a short Amplatz wire which was advanced through the tube and coiled within the gallbladder lumen Next, under intermittent fluoroscopic guidance, the existing 10 [place] cholecystostomy tube was exchanged for a new, 10 Fr cholecystostomy tube which was repositioned into the more central aspect of the gallbladder lumen. Contrast injection confirms appropriate positioning and functionality of the cholecystomy tube. The cholecystostomy tube was flushed with a small  amount of saline and reconnected to a gravity bag. The cholecystostomy tube was secured with an interrupted suture and a Stat Lock device. A dressing was applied. The patient tolerated the procedure well without immediate postprocedural complication. FINDINGS: *Preprocedural spot fluoroscopic image demonstrates unchanged positioning of cholecystostomy tube with end coiled and locked over the expected location of the fundus of the gallbladder *Antegrade cholangiogram demonstrating a patent cystic duct and no extrahepatic biliary ductal dilatation. Question small filling defect at the ampulla, suspicious for choledocholithiasis. See key image. *After fluoroscopic guided exchange, the new, 10 Fr cholecystostomy tube is more ideally positioned with end coiled and locked within  the central aspect of the gallbladder lumen. *Post exchange cholangiogram demonstrates appropriate positioning and functionality of the new cholecystostomy tube. IMPRESSION: Successful fluoroscopic guided exchange, and repositioning for a new 10 Fr cholecystostomy tube. Question small filling defect at the ampulla, suspicious for choledocholithiasis. No extrahepatic biliary ductal dilatation. PLAN: The patient will return to Vascular Interventional Radiology (VIR) for routine drainage catheter evaluation and exchange in 6-8 weeks. Roanna Banning, MD [facility] Radiology Electronically Signed   By: Roanna Banning M.D.   On: 03/08/2023 09:25   ECHOCARDIOGRAM LIMITED  Result Date: 03/06/2023    ECHOCARDIOGRAM LIMITED REPORT   Patient Name:   ROYZELL ARMINIO Date of Exam: 03/06/2023 Medical Rec #:  [id number]      Height:       67.0 in Accession #:    [id number]     Weight:       148.4 lb Date of Birth:  1945/08/30     BSA:          1.781 m Patient Age:    76 years       BP:           99/53 mmHg Patient Gender: M              HR:           73 bpm. Exam Location:  Inpatient Procedure: Limited Echo, Limited Color Doppler and Intracardiac Opacification            Agent Indications:    Acute MI  History:        Patient has prior history of Echocardiogram examinations, most                 recent 02/28/2023. Acute MI, Prior CABG, Aortic Valve Disease,                 Arrythmias:Bradycardia, Signs/Symptoms:Murmur; Risk                 Factors:Hypertension, Sleep Apnea and Diabetes.                 Aortic Valve: 23 mm Edwards bioprosthetic valve is present in                 the aortic position.  Sonographer:    Milda Smart Referring Phys: [id number] Orbie Pyo  Sonographer Comments: Suboptimal parasternal window. Image acquisition challenging due to patient body habitus and Image acquisition challenging due to respiratory motion. IMPRESSIONS  1. Left ventricular ejection  fraction, by estimation, is 35 to 40%. The left ventricle has moderately decreased function. The left ventricle demonstrates global hypokinesis. Left ventricular diastolic function could not be evaluated.  2. Right ventricular systolic function is normal. The right ventricular size is normal.  3. The mitral valve is normal in structure. Mild mitral valve regurgitation. No evidence  of mitral stenosis. Moderate mitral annular calcification.  4. The aortic valve has been repaired/replaced. Aortic valve regurgitation is not visualized. There is a 23 mm Edwards bioprosthetic valve present in the aortic position.  5. The inferior vena cava is normal in size with greater than 50% respiratory variability, suggesting right atrial pressure of 3 mmHg. FINDINGS  Left Ventricle: Left ventricular ejection fraction, by estimation, is 35 to 40%. The left ventricle has moderately decreased function. The left ventricle demonstrates global hypokinesis. Definity contrast agent was given IV to delineate the left ventricular endocardial borders. The left ventricular internal cavity size was normal in size. There is no left ventricular hypertrophy. Left ventricular diastolic function could not be evaluated. Right Ventricle: The right ventricular size is normal. No increase in right ventricular wall thickness. Right ventricular systolic function is normal. Left Atrium: Left atrial size was normal in size. Right Atrium: Right atrial size was normal in size. Pericardium: There is no evidence of pericardial effusion. Mitral Valve: The mitral valve is normal in structure. Moderate mitral annular calcification. Mild mitral valve regurgitation. No evidence of mitral valve stenosis. Tricuspid Valve: The tricuspid valve is normal in structure. Tricuspid valve regurgitation is mild . No evidence of tricuspid stenosis. Aortic Valve: The aortic valve has been repaired/replaced. Aortic valve regurgitation is not visualized. There is a 23 mm Edwards  bioprosthetic valve present in the aortic position. Pulmonic Valve: The pulmonic valve was normal in structure. Pulmonic valve regurgitation is trivial. No evidence of pulmonic stenosis. Aorta: The aortic root is normal in size and structure. Venous: The inferior vena cava is normal in size with greater than 50% respiratory variability, suggesting right atrial pressure of 3 mmHg. IAS/Shunts: No atrial level shunt detected by color flow Doppler. Additional Comments: Color Doppler performed.  LEFT VENTRICLE PLAX 2D LVIDd:         4.30 cm LVIDs:         3.70 cm  Armanda Magic MD Electronically signed by Armanda Magic MD Signature Date/Time: 03/06/2023/12:51:11 PM    Final     Labs:  CBC: Recent Labs    03/06/23 0500 03/07/23 0538 03/08/23 1038 03/09/23 0448 03/10/23 0322  WBC 15.2*  --  11.9* 12.9* 9.8  HGB 8.2* 8.9* 8.2* 8.8* 8.9*  HCT 24.5* 27.2* 25.6* 26.6* 27.7*  PLT 177  --  188 206 195    COAGS: Recent Labs    02/23/23 1616 03/03/23 0418  INR  --  1.3*  APTT 36  --     BMP: Recent Labs    03/07/23 0538 03/08/23 1038 03/09/23 0448 03/10/23 0322  NA 140 139 139 141  K 3.9 3.7 3.6 3.3*  CL 109 109 107 106  CO2 19* 19* 21* 23  GLUCOSE 213* 228* 200* 157*  BUN 40* 31* 30* 33*  CALCIUM 8.6* 8.4* 8.7* 8.8*  CREATININE 1.77* 1.42* 1.36* 1.45*  GFRNONAA 39* 51* 54* 50*    LIVER FUNCTION TESTS: Recent Labs    02/28/23 0659 02/28/23 1644 03/02/23 0942 03/03/23 0418 03/07/23 0538  BILITOT 2.5* 2.4* 1.8* 1.4*  --   AST 72* 73* 71* 69*  --   ALT 58* 60* 52* 47*  --   ALKPHOS 81 74 64 61  --   PROT 5.8* 6.1* 5.8* 5.2*  --   ALBUMIN 2.2* 2.2* 2.2* 2.0* 2.0*    Assessment and Plan:  77 y.o. male inpatient. History of aortic stenosis s/p aortic valve replacement, CAD s/p CABG, type 2 diabetes mellitus, hx  of myocardial infarction, hypertension, and obstructive sleep apnea being seen today in relation acute cholecystitis. Patient initially admitted to Cardiology with  NSTEMI on 02/20/23. Patient's hospitalized further complicated by respiratory failure requiring BiPAP and development of delirium. CT CAP on 03/01/23 revealed concern for possible acute cholecystitis. Ultrasound and HIDA scan later positive for acute cholecystitis. IR placed a cholecystomy tube on 8.22.24. The drain was exchanged on 8.28.24 due to malposition  Drain Location: RUQ Size: Fr size: 10 Fr Date of placement: 8.22.24  Currently to: Drain collection device: gravity  24 hour output:  Output by Drain (mL) 03/08/23 0701 - 03/08/23 1900 03/08/23 1901 - 03/09/23 0700 03/09/23 0701 - 03/09/23 1900 03/09/23 1901 - 03/10/23 0700 03/10/23 0701 - 03/10/23 0934  Closed System Drain 1 Right Abdomen 10 Fr. 75 40  75   Cultures grew e.coli  Interval imaging/drain manipulation:  None since exchange and reposition on 8.28.24  Current examination: Flushes/aspirates easily.  Insertion site unremarkable. Suture and stat lock in place. Dressed appropriately.   Plan: Continue TID flushes with 5 cc NS. Record output Q shift. Dressing changes QD or PRN if soiled.  Call IR APP or on call IR MD if difficulty flushing or sudden change in drain output.   Discharge planning: Follow up plans are in place. Typically patient will follow up 6-8 week for drain exchange. Cholecystomy tube drain will need to remain in place at least 6 -8  weeks unless gallbladder surgically removed in interim; Once daily with 5 ml sterile normal saline as outpatient; will schedule f/u cholangiogram/ drain/ exchange in 6-8 weeks. IR scheduler will contact patient with date/time of appointment. Patient will need to flush drain QD with 5 cc NS, record output QD, dressing changes every 2-3 days or earlier if soiled.   IR will continue to follow - please call with questions or concerns.      Electronically Signed: Alene Mires, NP 03/10/2023, 9:31 AM   I spent a total of 15 Minutes at the patient's bedside AND on the  patient's hospital floor or unit, greater than 50% of which was counseling/coordinating care for cholecystomy tube

## 2023-03-10 NOTE — Plan of Care (Signed)
Patient has to be reminded to call when he is getting out of bed. He typically sundown at night and will request his wife. Patient will need SNF placement. Vitals are stable and will continue to monitor patient/

## 2023-03-10 NOTE — Progress Notes (Signed)
Mobility Specialist Progress Note:    03/10/23 1558  Mobility  Activity Ambulated with assistance in hallway  Level of Assistance Contact guard assist, steadying assist  Assistive Device Front wheel walker  Distance Ambulated (ft) 400 ft  Activity Response Tolerated well  Mobility Referral Yes  $Mobility charge 1 Mobility  Mobility Specialist Start Time (ACUTE ONLY) 1533  Mobility Specialist Stop Time (ACUTE ONLY) 1546  Mobility Specialist Time Calculation (min) (ACUTE ONLY) 13 min   Received pt in bed having no complaints and agreeable to mobility. Pt was asymptomatic throughout ambulation and returned to room w/o fault. Left in bed w/ call bell in reach and all needs met. Bed alarm on.   Thompson Grayer Mobility Specialist  Please contact vis Secure Chat or  Rehab Office [phone]

## 2023-03-10 NOTE — Plan of Care (Signed)
  Problem: Activity: Goal: Ability to tolerate increased activity will improve Outcome: Not Applicable   Problem: Cardiac: Goal: Ability to achieve and maintain adequate cardiovascular perfusion will improve Outcome: Not Applicable   Problem: Health Behavior/Discharge Planning: Goal: Ability to safely manage health-related needs after discharge will improve Outcome: Not Applicable  Patient is confused

## 2023-03-10 NOTE — Progress Notes (Addendum)
PROGRESS NOTE    Nathan [name]  ZOX:[id number] DOB: 02-16-1946 DOA: 02/20/2023 PCP: Garlan Fillers, MD  77 y.o. M with DM, HTN, CAD s/p CABG and AVR, hx OSA, and history severe necrotizing fasciitis few years ago who presented with chest discomfort. High sensitive troponin 12, 31, 4,527, 6,445   -placed on nitroglycerin drip for pain control and heparin drip for anticoagulation.  8/14 echo: EF 20-25% 8/20 echo: EF 25-30% with RWMA, no thrombus; AV repair noted   8/13: Admitted on heparin to Cardiology for NSTEMI --> evening of HD1, developed respiratory distress and rales requiring BiPAP and ICU transfer 8/14: Developed delirium requiring Precedex; Transaminitis noted 8/15: Cr peaks at 2.15 8/16: Weaned off Precedex; new onset Atrial fibrillation noted 8/17: Leukocytosis noted 8/18: Antibiotics started for Pneumonia based on CXR 8/20: Hgb down to 8s 8/21: ID consulted; CT C/A/P shows abnormal gallbladder, new hematoma; heparin stopped 8/22: IR and Gen Surg consulted; HIDA confirms cholecystitis; transfused 2 units; Perc chole drain placed 8/23: Worsening agitation/delirium 8/24 further droop in Hgb down to 6,9, hypotension. Stopped heparin drip and ordered PRBC transfusion x1.  8/25 mentation has improved but not back to baseline, continues to use intermittently soft wrist restrains.  8/26 encephalopathy slowly improving, off restrains and improved po intake.  8/27 patient pulled his biliary drain and will need to be replaced per IR. Antibiotic therapy changed to oral Augmentin. Encephalopathy is improving, continue off restrains.  8/28: Drain exchanged in IR   Subjective: -Feels okay overall, still with some confusion but overall improving, denies dyspnea  Assessment and Plan:  NSTEMI (non-ST elevated myocardial infarction) (HCC) Coronary artery disease sp CABG 2010 Echocardiogram with new reduction on LV systolic function, to 20 to 25%, global hypokinesis.  -Followed by  cardiology, no plans for invasive workup in the setting of delirium renal failure frailty etc. -conservative management -Continue aspirin, statin, isosorbide and metoprolol  Acute on chronic systolic CHF  Echo w/ EF 20 to 25%, with global hypokinesis, mild dilatated LV cavity, mild LVH, RV systolic function with moderate reduction -GDMT was limited by hypotension and AKI -Repeat echo 03/06/2023 noted improvement in EF to 35-40% -Volume status appears stable, blood pressure improved, started Jardiance, adding Aldactone today  Acute hypoxemic respiratory failure due to acute cardiogenic pulmonary edema. Required non invasive mechanical ventilation on admission.  -resolved  Acute metabolic encephalopathy -Delirium in the setting of cholecystitis, pain and prolonged hospitalization -PRN haloperidol and Quetiapine 25 mg po daily.  -Mental status slowly improving -Telemetry sitter discontinued yesterday, discharge planning  Acute cholecystitis -Imaging 8/21 showed signs of gallstones and cholecystitis, confirmed with HIDA. -Perc tube placed 8/22 -Plan for 6 weeks drain, follow up in IR clinic -8/27 transition to oral Augmentin, will need to complete 3 weeks total, and final duration pending repeat imaging.  -8/27 drain pulled by patient, drain exchanged 8/28, FU with Radiology in 6 weeks and Gen Surgery in 1-83months  Acute posthemorrhagic anemia 8/21 CT with incompletely imaged heterogeneous mixed hyper and hypodensity in the region of the right adductor musculature measuring 9,0 x 8,6 cm,  likely hematoma.  -sp 3 units PRBC last 8/24 -hb stable, continue to trend  Acute kidney injury on CKD stage 3a, GFR 45-59 ml/min (HCC) AKI, hypokalemia.  -Improving  Dyslipidemia Continue with statin therapy.   Paroxysmal atrial fibrillation (HCC) New onset.  CHA2DS2-Vasc 6 (age, HTN, CHF, DM, vascular disease).  Continue rate control with metoprolol, holding anticoagulation due to acute bleed  (left thigh hematoma)   S/P  AVR  Type 2 diabetes mellitus -CBGs poorly controlled, on Semglee, continue Jardiance   DVT prophylaxis: SCDS, anticoag held w/ Large hematoma and anemia Code Status: DNR Family Communication: None present, called and updated spouse Disposition Plan: SNF in 1-2days  Consultants:    Procedures:   Antimicrobials:    Objective: Vitals:   03/09/23 1946 03/10/23 0010 03/10/23 0310 03/10/23 0741  BP:  97/61 124/65 (!) 112/52  Pulse:  88 81 83  Resp: 18 20 15 16   Temp:  98.1 F (36.7 C) 97.6 F (36.4 C) 97.6 F (36.4 C)  TempSrc:  Oral Oral Oral  SpO2: 98% 100% 100% 95%  Weight:   65 kg   Height:        Intake/Output Summary (Last 24 hours) at 03/10/2023 1206 Last data filed at 03/10/2023 0708 Gross per 24 hour  Intake 359 ml  Output 1070 ml  Net -711 ml   Filed Weights   03/07/23 1127 03/09/23 0247 03/10/23 0310  Weight: 66 kg 65.1 kg 65 kg    Examination:  Gen: Frail elderly male sitting up in bed, AAOx2, moderate cognitive deficits HEENT: No JVD CVS: S1-S2, regular rhythm Lungs: Clear anteriorly Abdomen: Soft, abdominal binder on, gallbladder drain noted, bowel sounds present Extremities: No edema  Skin: no new rashes on exposed skin    Data Reviewed:   CBC: Recent Labs  Lab 03/05/23 0341 03/06/23 0500 03/07/23 0538 03/08/23 1038 03/09/23 0448 03/10/23 0322  WBC 20.7* 15.2*  --  11.9* 12.9* 9.8  HGB 8.8* 8.2* 8.9* 8.2* 8.8* 8.9*  HCT 25.4* 24.5* 27.2* 25.6* 26.6* 27.7*  MCV 84.4 88.1  --  91.8 89.9 94.2  PLT 173 177  --  188 206 195   Basic Metabolic Panel: Recent Labs  Lab 03/06/23 0500 03/07/23 0538 03/08/23 1038 03/09/23 0448 03/10/23 0322  NA 141 140 139 139 141  K 4.2 3.9 3.7 3.6 3.3*  CL 112* 109 109 107 106  CO2 19* 19* 19* 21* 23  GLUCOSE 146* 213* 228* 200* 157*  BUN 52* 40* 31* 30* 33*  CREATININE 2.05* 1.77* 1.42* 1.36* 1.45*  CALCIUM 8.4* 8.6* 8.4* 8.7* 8.8*  PHOS  --  2.5  --   --   --     GFR: Estimated Creatinine Clearance: 39.2 mL/min (A) (by C-G formula based on SCr of 1.45 mg/dL (H)). Liver Function Tests: Recent Labs  Lab 03/07/23 0538  ALBUMIN 2.0*   No results for input(s): "LIPASE", "AMYLASE" in the last 168 hours. No results for input(s): "AMMONIA" in the last 168 hours. Coagulation Profile: No results for input(s): "INR", "PROTIME" in the last 168 hours.  Cardiac Enzymes: No results for input(s): "CKTOTAL", "CKMB", "CKMBINDEX", "TROPONINI" in the last 168 hours. BNP (last 3 results) No results for input(s): "PROBNP" in the last 8760 hours. HbA1C: No results for input(s): "HGBA1C" in the last 72 hours. CBG: Recent Labs  Lab 03/09/23 1557 03/09/23 2005 03/10/23 0004 03/10/23 0312 03/10/23 0740  GLUCAP 175* 162* 203* 157* 132*   Lipid Profile: No results for input(s): "CHOL", "HDL", "LDLCALC", "TRIG", "CHOLHDL", "LDLDIRECT" in the last 72 hours. Thyroid Function Tests: No results for input(s): "TSH", "T4TOTAL", "FREET4", "T3FREE", "THYROIDAB" in the last 72 hours. Anemia Panel: No results for input(s): "VITAMINB12", "FOLATE", "FERRITIN", "TIBC", "IRON", "RETICCTPCT" in the last 72 hours. Urine analysis:    Component Value Date/Time   COLORURINE YELLOW 02/28/2023 0951   APPEARANCEUR HAZY (A) 02/28/2023 0951   LABSPEC 1.016 02/28/2023 0951   PHURINE  5.0 02/28/2023 0951   GLUCOSEU NEGATIVE 02/28/2023 0951   HGBUR MODERATE (A) 02/28/2023 0951   BILIRUBINUR NEGATIVE 02/28/2023 0951   KETONESUR 20 (A) 02/28/2023 0951   PROTEINUR NEGATIVE 02/28/2023 0951   UROBILINOGEN 0.2 10/16/2008 1546   NITRITE NEGATIVE 02/28/2023 0951   LEUKOCYTESUR NEGATIVE 02/28/2023 0951   Sepsis Labs: @LABRCNTIP (procalcitonin:4,lacticidven:4)  ) Recent Results (from the past 240 hour(s))  Culture, blood (x 2)     Status: None   Collection Time: 02/28/23  4:44 PM   Specimen: BLOOD LEFT ARM  Result Value Ref Range Status   Specimen Description BLOOD LEFT ARM   Final   Special Requests   Final    BOTTLES DRAWN AEROBIC AND ANAEROBIC Blood Culture adequate volume   Culture   Final    NO GROWTH 5 DAYS Performed at [facility] Lab, [address] [place], [place] [address]    Report Status 03/05/2023 FINAL  Final  Culture, blood (x 2)     Status: None   Collection Time: 02/28/23  4:58 PM   Specimen: BLOOD LEFT ARM  Result Value Ref Range Status   Specimen Description BLOOD LEFT ARM  Final   Special Requests   Final    BOTTLES DRAWN AEROBIC AND ANAEROBIC Blood Culture results may not be optimal due to an excessive volume of blood received in culture bottles   Culture   Final    NO GROWTH 5 DAYS Performed at [facility], [address], [place], [place] [address]    Report Status 03/05/2023 FINAL  Final  Respiratory (~20 pathogens) panel by PCR     Status: None   Collection Time: 02/28/23  6:10 PM   Specimen: Nasopharyngeal Swab; Respiratory  Result Value Ref Range Status   Adenovirus NOT DETECTED NOT DETECTED Final   Coronavirus 229E NOT DETECTED NOT DETECTED Final    Comment: (NOTE) The Coronavirus on the Respiratory Panel, DOES NOT test for the novel  Coronavirus (2019 nCoV)    Coronavirus HKU1 NOT DETECTED NOT DETECTED Final   Coronavirus NL63 NOT DETECTED NOT DETECTED Final   Coronavirus OC43 NOT DETECTED NOT DETECTED Final   Metapneumovirus NOT DETECTED NOT DETECTED Final   Rhinovirus / Enterovirus NOT DETECTED NOT DETECTED Final   Influenza A NOT DETECTED NOT DETECTED Final   Influenza B NOT DETECTED NOT DETECTED Final   Parainfluenza Virus 1 NOT DETECTED NOT DETECTED Final   Parainfluenza Virus 2 NOT DETECTED NOT DETECTED Final   Parainfluenza Virus 3 NOT DETECTED NOT DETECTED Final   Parainfluenza Virus 4 NOT DETECTED NOT DETECTED Final   Respiratory Syncytial Virus NOT DETECTED NOT DETECTED Final   Bordetella pertussis NOT DETECTED NOT DETECTED Final   Bordetella Parapertussis NOT DETECTED NOT DETECTED Final    Chlamydophila pneumoniae NOT DETECTED NOT DETECTED Final   Mycoplasma pneumoniae NOT DETECTED NOT DETECTED Final    Comment: Performed at [facility], [address], [place], [place] [address]  SARS Coronavirus 2 by RT PCR (hospital order, performed in [facility] hospital lab) *cepheid single result test* Anterior Nasal Swab     Status: None   Collection Time: 03/01/23 11:19 AM   Specimen: Anterior Nasal Swab  Result Value Ref Range Status   SARS Coronavirus 2 by RT PCR NEGATIVE NEGATIVE Final    Comment: Performed at [facility], [address] [place], [place] [address]  Aerobic/Anaerobic Culture w Gram Stain (surgical/deep wound)     Status: None  Collection Time: 03/02/23  5:03 PM   Specimen: Wound; Bile  Result Value Ref Range Status   Specimen Description WOUND  Final   Special Requests bile, gallbladder  Final   Gram Stain NO WBC SEEN FEW GRAM NEGATIVE RODS   Final   Culture   Final    MODERATE ESCHERICHIA COLI NO ANAEROBES ISOLATED Performed at [facility] Lab, [address] [place], [place] [address]    Report Status 03/07/2023 FINAL  Final   Organism ID, Bacteria ESCHERICHIA COLI  Final      Susceptibility   Escherichia coli - MIC*    AMPICILLIN 16 INTERMEDIATE Intermediate     CEFEPIME <=0.12 SENSITIVE Sensitive     CEFTAZIDIME <=1 SENSITIVE Sensitive     CEFTRIAXONE <=0.25 SENSITIVE Sensitive     CIPROFLOXACIN <=0.25 SENSITIVE Sensitive     GENTAMICIN <=1 SENSITIVE Sensitive     IMIPENEM <=0.25 SENSITIVE Sensitive     TRIMETH/SULFA <=20 SENSITIVE Sensitive     AMPICILLIN/SULBACTAM 4 SENSITIVE Sensitive     PIP/TAZO <=4 SENSITIVE Sensitive     * MODERATE ESCHERICHIA COLI     Radiology Studies: No results found.   Scheduled Meds:  allopurinol  300 mg Oral Daily   amoxicillin-clavulanate  1 tablet Oral Q12H   vitamin C  1,000 mg Oral Daily   atorvastatin  80 mg Oral Daily   docusate sodium  100 mg Oral BID   empagliflozin  10 mg Oral  Daily   feeding supplement (GLUCERNA SHAKE)  237 mL Oral TID BM   insulin aspart  0-9 Units Subcutaneous TID WC   insulin glargine-yfgn  10 Units Subcutaneous Daily   latanoprost  1 drop Both Eyes QHS   melatonin  5 mg Oral QHS   metoprolol succinate  50 mg Oral Daily   multivitamin with minerals  1 tablet Oral Daily   polyethylene glycol  17 g Oral Daily   QUEtiapine  25 mg Oral QHS   saccharomyces boulardii  250 mg Oral BID   sodium chloride flush  5 mL Intracatheter Q8H   spironolactone  12.5 mg Oral Daily   Continuous Infusions:  sodium chloride Stopped (02/24/23 0824)     LOS: 17 days    Time spent:    Zannie Cove, MD [facility]   03/10/2023, 12:06 PM

## 2023-03-10 NOTE — TOC Progression Note (Signed)
Transition of Care [facility]) - Progression Note    Patient Details  Name: Nathan [name] MRN: [id number] Date of Birth: [birth year]  Transition of Care [facility]) CM/SW Contact  Delilah Shan, LCSWA Phone Number: 03/10/2023, 11:52 AM  Clinical Narrative:     CSW spoke with Wille Celeste with [facility] who informed CSW patient will need to be off telesitter for 24 more hours and off haldol for 48 hours before can accept patient for SNF. CSW will continue to follow and assist with patients dc planning needs.  Expected Discharge Plan: Skilled Nursing Facility Barriers to Discharge: Continued Medical Work up  Expected Discharge Plan and Services In-house Referral: Clinical Social Work Discharge Planning Services: CM Consult   Living arrangements for the past 2 months: Single Family Home                 DME Arranged: Walker rolling DME Agency: [employer] Date DME Agency Contacted: 02/27/23 Time DME Agency Contacted: 1302 Representative spoke with at DME Agency: Ian Malkin HH Arranged: PT HH Agency: [facility] Date [facility] Agency Contacted: 02/27/23 Time HH Agency Contacted: 1302 Representative spoke with at [facility] Agency: Tresa Endo   Social Determinants of Health (SDOH) Interventions SDOH Screenings   Food Insecurity: No Food Insecurity (02/25/2023)  Housing: Low Risk  (02/25/2023)  Transportation Needs: No Transportation Needs (02/25/2023)  Utilities: Not At Risk (02/25/2023)  Tobacco Use: Low Risk  (02/27/2023)    Readmission Risk Interventions    02/22/2023    2:58 PM  Readmission Risk Prevention Plan  Transportation Screening Complete  HRI or Home Care Consult Complete  Social Work Consult for Recovery Care Planning/Counseling Complete  Palliative Care Screening Not Applicable  Medication Review Oceanographer) Referral to Pharmacy

## 2023-03-10 NOTE — Progress Notes (Signed)
Rounding Note    Patient Name: Nathan [name] Date of Encounter: 03/10/2023  [facility] Cardiologist: Donato Schultz, MD   Subjective   Sitting up in bed, wearing an abd binder because he has been pulling out lines/drain   Inpatient Medications    Scheduled Meds:  allopurinol  300 mg Oral Daily   amoxicillin-clavulanate  1 tablet Oral Q12H   vitamin C  1,000 mg Oral Daily   atorvastatin  80 mg Oral Daily   docusate sodium  100 mg Oral BID   empagliflozin  10 mg Oral Daily   feeding supplement (GLUCERNA SHAKE)  237 mL Oral TID BM   insulin aspart  0-9 Units Subcutaneous TID WC   insulin glargine-yfgn  10 Units Subcutaneous Daily   latanoprost  1 drop Both Eyes QHS   melatonin  5 mg Oral QHS   metoprolol succinate  50 mg Oral Daily   multivitamin with minerals  1 tablet Oral Daily   polyethylene glycol  17 g Oral Daily   QUEtiapine  25 mg Oral QHS   saccharomyces boulardii  250 mg Oral BID   sodium chloride flush  5 mL Intracatheter Q8H   Continuous Infusions:  sodium chloride Stopped (02/24/23 0824)   PRN Meds: sodium chloride, acetaminophen, haloperidol lactate, HYDROmorphone (DILAUDID) injection, nitroGLYCERIN, mouth rinse, oxyCODONE-acetaminophen, senna-docusate   Vital Signs    Vitals:   03/09/23 1946 03/10/23 0010 03/10/23 0310 03/10/23 0741  BP:  97/61 124/65 (!) 112/52  Pulse:  88 81 83  Resp: 18 20 15 16   Temp:  98.1 F (36.7 C) 97.6 F (36.4 C) 97.6 F (36.4 C)  TempSrc:  Oral Oral Oral  SpO2: 98% 100% 100% 95%  Weight:   65 kg   Height:        Intake/Output Summary (Last 24 hours) at 03/10/2023 1030 Last data filed at 03/10/2023 0708 Gross per 24 hour  Intake 359 ml  Output 1070 ml  Net -711 ml      03/10/2023    3:10 AM 03/09/2023    2:47 AM 03/07/2023   11:27 AM  Last 3 Weights  Weight (lbs) 143 lb 4.8 oz 143 lb 8 oz 145 lb 8 oz  Weight (kg) 65 kg 65.091 kg 65.998 kg      Telemetry    Atrial fibrillation, PVCs - Personally  Reviewed  Physical Exam   GEN: No acute distress.   Neck: No JVD Cardiac: Irreg Irreg, no murmurs, rubs, or gallops.  Respiratory: Clear to auscultation bilaterally. GI: Soft, nontender, non-distended  MS: No edema; No deformity. Neuro:  Nonfocal  Psych: Normal affect   Labs    High Sensitivity Troponin:   Recent Labs  Lab 02/20/23 2120 02/20/23 2344 02/21/23 0624 02/21/23 1815 02/21/23 2110  TROPONINIHS 13 12 31* 4,527* 6,445*     Chemistry Recent Labs  Lab 03/07/23 0538 03/08/23 1038 03/09/23 0448 03/10/23 0322  NA 140 139 139 141  K 3.9 3.7 3.6 3.3*  CL 109 109 107 106  CO2 19* 19* 21* 23  GLUCOSE 213* 228* 200* 157*  BUN 40* 31* 30* 33*  CREATININE 1.77* 1.42* 1.36* 1.45*  CALCIUM 8.6* 8.4* 8.7* 8.8*  ALBUMIN 2.0*  --   --   --   GFRNONAA 39* 51* 54* 50*  ANIONGAP 12 11 11 12     Lipids No results for input(s): "CHOL", "TRIG", "HDL", "LABVLDL", "LDLCALC", "CHOLHDL" in the last 168 hours.  Hematology Recent Labs  Lab 03/08/23 1038 03/09/23  0448 03/10/23 0322  WBC 11.9* 12.9* 9.8  RBC 2.79* 2.96* 2.94*  HGB 8.2* 8.8* 8.9*  HCT 25.6* 26.6* 27.7*  MCV 91.8 89.9 94.2  MCH 29.4 29.7 30.3  MCHC 32.0 33.1 32.1  RDW 21.4* 21.4* 22.0*  PLT 188 206 195   Thyroid No results for input(s): "TSH", "FREET4" in the last 168 hours.  BNPNo results for input(s): "BNP", "PROBNP" in the last 168 hours.  DDimer No results for input(s): "DDIMER" in the last 168 hours.   Radiology    No results found.  Cardiac Studies   TTE 02/24/2023: EF 25 to 30%.  Diffuse HK in the inferior, inferoseptal and apical walls as well as distal pole akinesis.  Cannot exclude mural thrombus.  Normal RV with severely elevated RAP of 15 mmHg..  Moderate to severe MAC with mild MR.  Status post AVR with 23 mm Edwards prosthesis.  Mean 8 PG 7 mmHg.  TTE 02/28/2023: EF 25 to 30%.  TTE 03/06/2023: EF now estimated 35 to 40% global HK.  Unable to assess endocardial borders to determine wall  motion.  Unable to assess diastolic function.  Stable 23 mm Edwards prosthetic aortic valve with no notable AI or AAS.  RAP now 3 mmHg.  Patient Profile     77 y.o. male with hx of CAD s/p CABG x4 in 10/2008, severe aortic stenosis  s/p AVR in 10/2008 at time of CABG, hypertension, hyperlipidemia, type 2 diabetes mellitus, CKD stage III, GERD, and obstructive sleep apnea, severe necrotizing fasciitis and admitted on 02/20/2023 for the evaluation of NSTEMI.   Assessment & Plan    NSTEMI  CAD status post CABG in 2010 -- initially presented with elevated hsTn [id number]. Found to have newly reduced EF 20 to 25% with wall motion abnormalities in the inferior, inferoseptal, apical walls. Plans were for [facility] but have been delayed 2/2 declining renal function, anemia, and altered mental status. At this time will continue to treat medically, no plans for invasive evaluation right now. -- Continue aspirin, statin, Imdur 30 mg, Toprol XL 50mg  daily   Acute HFrEF -- LVEF decline of 20-25% on admission, improved to 35-40%, global hypokinesis, normal RV. Initially required diuresis, but now euvolemic -- GDMT: Toprol XL 50mg  daily, Jardiance, no room for ARB/ARNi with renal disease (improving). Add low dose spiro 12.5mg  daily    Paroxsymal atrial fibrillation -- Rate controlled with heart rates in the 80s.  Unable to anticoagulate with anemia -- continue Toprol XL 50mg  daily   Severe aortic stenosis status post AVR in 2010 -- Echo this admission with mean gradient 7, peak gradient 14.     Hyperlipidemia -- LDL 37 -- Continue atorvastatin 80 mg   Altered mental status/encephalopathy Suicidal ideations -- currently on delirium precautions, did require precedex earlier in admission. Was expressing suicidal ideations  -- improving, management per primary   CKD with AKI -- Renal function on admission 1.28, peaked at 2.75, improved to 1.45   Acute cholecystitis -- Status post PERC drain on  03/02/2023 -- per primary   Hemorrhagic anemia -- CT on 03/01/2023 indicating mixed hyper/hypodensity region in the right adductor musculature suggesting hematoma.  -- Hgb dropped to 6.9. S/p 3 units PRBCs. Stable at 8.9  -- Spontaneous bleeding on anticoagulation, therefore would not resume long term OAC at this time. Could reconsider as an outpatient  Hypokalemia -- K+ 3.3, supplemented   For questions or updates, please contact [facility] Please consult [email or web address] for contact info under  Signed, Laverda Page, NP  03/10/2023, 10:30 AM

## 2023-03-11 DIAGNOSIS — I21A1 Myocardial infarction type 2: Secondary | ICD-10-CM

## 2023-03-11 LAB — BASIC METABOLIC PANEL
Anion gap: 9 (ref 5–15)
BUN: 33 mg/dL — ABNORMAL HIGH (ref 8–23)
CO2: 24 mmol/L (ref 22–32)
Calcium: 8.8 mg/dL — ABNORMAL LOW (ref 8.9–10.3)
Chloride: 109 mmol/L (ref 98–111)
Creatinine, Ser: 1.79 mg/dL — ABNORMAL HIGH (ref 0.61–1.24)
GFR, Estimated: 39 mL/min — ABNORMAL LOW (ref >60)
Glucose, Bld: 192 mg/dL — ABNORMAL HIGH (ref 70–99)
Potassium: 4.5 mmol/L (ref 3.5–5.1)
Sodium: 142 mmol/L (ref 135–145)

## 2023-03-11 LAB — CBC
HCT: 32.1 % — ABNORMAL LOW (ref 39.0–52.0)
Hemoglobin: 10 g/dL — ABNORMAL LOW (ref 13.0–17.0)
MCH: 29.5 pg (ref 26.0–34.0)
MCHC: 31.2 g/dL (ref 30.0–36.0)
MCV: 94.7 fL (ref 80.0–100.0)
Platelets: 207 10*3/uL (ref 150–400)
RBC: 3.39 MIL/uL — ABNORMAL LOW (ref 4.22–5.81)
RDW: 22.4 % — ABNORMAL HIGH (ref 11.5–15.5)
WBC: 9.7 10*3/uL (ref 4.0–10.5)
nRBC: 0 % (ref 0.0–0.2)

## 2023-03-11 LAB — GLUCOSE, CAPILLARY
Glucose-Capillary: 189 mg/dL — ABNORMAL HIGH (ref 70–99)
Glucose-Capillary: 209 mg/dL — ABNORMAL HIGH (ref 70–99)
Glucose-Capillary: 130 mg/dL — ABNORMAL HIGH (ref 70–99)
Glucose-Capillary: 149 mg/dL — ABNORMAL HIGH (ref 70–99)

## 2023-03-11 MED ORDER — QUETIAPINE FUMARATE 50 MG PO TABS
50.0000 mg | ORAL_TABLET | Freq: Every evening | ORAL | Status: DC
  Administered 2023-03-11 – 2023-03-12 (×2): 50 mg via ORAL
  Filled 2023-03-11 (×2): qty 1

## 2023-03-11 MED ORDER — EMPAGLIFLOZIN 10 MG PO TABS
10.0000 mg | ORAL_TABLET | Freq: Every day | ORAL | Status: DC
  Administered 2023-03-12 – 2023-03-13 (×2): 10 mg via ORAL
  Filled 2023-03-11 (×2): qty 1

## 2023-03-11 MED ORDER — SPIRONOLACTONE 12.5 MG HALF TABLET
12.5000 mg | ORAL_TABLET | Freq: Every day | ORAL | Status: DC
  Administered 2023-03-12 – 2023-03-13 (×2): 12.5 mg via ORAL
  Filled 2023-03-11 (×2): qty 1

## 2023-03-11 NOTE — Progress Notes (Signed)
PROGRESS NOTE    Nathan [name]  NWG:[id number] DOB: 12-17-1945 DOA: 02/20/2023 PCP: Garlan Fillers, MD  77 y.o. M with DM, HTN, CAD s/p CABG and AVR, hx OSA, and history severe necrotizing fasciitis few years ago who presented with chest discomfort. High sensitive troponin 12, 31, 4,527, 6,445   -placed on nitroglycerin drip for pain control and heparin drip for anticoagulation.  8/14 echo: EF 20-25% 8/20 echo: EF 25-30% with RWMA, no thrombus; AV repair noted   8/13: Admitted on heparin to Cardiology for NSTEMI --> evening of HD1, developed respiratory distress and rales requiring BiPAP and ICU transfer 8/14: Developed delirium requiring Precedex; Transaminitis noted 8/15: Cr peaks at 2.15 8/16: Weaned off Precedex; new onset Atrial fibrillation noted 8/17: Leukocytosis noted 8/18: Antibiotics started for Pneumonia based on CXR 8/20: Hgb down to 8s 8/21: ID consulted; CT C/A/P shows abnormal gallbladder, new hematoma; heparin stopped 8/22: IR and Gen Surg consulted; HIDA confirms cholecystitis; transfused 2 units; Perc chole drain placed 8/23: Worsening agitation/delirium 8/24 further droop in Hgb down to 6,9, hypotension. Stopped heparin drip and ordered PRBC transfusion x1.  8/25 mentation has improved but not back to baseline, continues to use intermittently soft wrist restrains.  8/26 encephalopathy slowly improving, off restrains and improved po intake.  8/27 patient pulled his biliary drain and will need to be replaced per IR. Antibiotic therapy changed to oral Augmentin. Encephalopathy is improving, continue off restrains.  8/28: Drain exchanged in IR   Subjective: -No events overnight, confusion improving  Assessment and Plan:  NSTEMI (non-ST elevated myocardial infarction) (HCC) Coronary artery disease sp CABG 2010 Echocardiogram with new reduction on LV systolic function, to 20 to 25%, global hypokinesis.  -Followed by cardiology, no plans for invasive workup in the  setting of delirium renal failure frailty etc. -conservative management -Continue aspirin, statin, isosorbide and metoprolol  Acute on chronic systolic CHF  Echo w/ EF 20 to 25%, with global hypokinesis, mild dilatated LV cavity, mild LVH, RV systolic function with moderate reduction -GDMT was limited by hypotension and AKI -Repeat echo 03/06/2023 noted improvement in EF to 35-40% -Volume status appears stable, blood pressure improved, -creatinine trending up, hold today's dose of Jardiance and Aldactone  Acute hypoxemic respiratory failure due to acute cardiogenic pulmonary edema. Required non invasive mechanical ventilation on admission.  -resolved  Acute metabolic encephalopathy -Delirium in the setting of cholecystitis, pain and prolonged hospitalization -PRN haloperidol and Quetiapine 25 mg po daily.  -Mental status slowly improving -Telemetry sitter discontinued, discharge planning  Acute cholecystitis -Imaging 8/21 showed signs of gallstones and cholecystitis, confirmed with HIDA. -Perc tube placed 8/22 -Plan for 6 weeks drain, follow up in IR clinic -8/27 transition to oral Augmentin, will need to complete 3 weeks total, and final duration pending repeat imaging.  -8/27 drain pulled by patient, drain exchanged 8/28, FU with Radiology in 6 weeks and Gen Surgery in 1-71months  Acute posthemorrhagic anemia 8/21 CT with incompletely imaged heterogeneous mixed hyper and hypodensity in the region of the right adductor musculature measuring 9,0 x 8,6 cm, hematoma, Hb dropped to 6.9 -sp 3 units PRBC last 8/24 -hb stable, continue to trend -Anticoagulation discontinued, reconsider as outpatient in 1 to 2 months  Acute kidney injury on CKD stage 3a, GFR 45-59 ml/min (HCC) AKI, hypokalemia.  -Improving  Dyslipidemia Continue with statin therapy.   Paroxysmal atrial fibrillation (HCC) New onset.  CHA2DS2-Vasc 6 (age, HTN, CHF, DM, vascular disease).  Continue rate control with  metoprolol, holding anticoagulation due to  acute bleed (left thigh hematoma)   S/P AVR  Type 2 diabetes mellitus -CBGs poorly controlled, on Semglee, continue Jardiance   DVT prophylaxis: SCDS, anticoag held w/ Large hematoma and anemia Code Status: DNR Family Communication: None present, called and updated spouse Disposition Plan: SNF in 1-2days  Consultants:    Procedures:   Antimicrobials:    Objective: Vitals:   03/10/23 1932 03/11/23 0509 03/11/23 0745 03/11/23 0900  BP: 113/63 121/63 110/73 130/74  Pulse: 85 95    Resp: 20 16    Temp: (!) 97.3 F (36.3 C) (!) 97.4 F (36.3 C) 98.1 F (36.7 C)   TempSrc: Oral Oral Oral   SpO2: 96% 99%    Weight:  63.4 kg    Height:        Intake/Output Summary (Last 24 hours) at 03/11/2023 1146 Last data filed at 03/11/2023 0900 Gross per 24 hour  Intake 242 ml  Output 750 ml  Net -508 ml   Filed Weights   03/09/23 0247 03/10/23 0310 03/11/23 0509  Weight: 65.1 kg 65 kg 63.4 kg    Examination:  Gen: Frail elderly male sitting up in bed, AAO x 2, moderate cognitive deficits, improving confusion HEENT: No JVD CVS: S1-S2, regular rhythm Lungs: Clear anteriorly Abdomen: Soft, abdominal binder on, gallbladder drain noted, bowel sounds present Extremities: No edema Skin: no new rashes on exposed skin    Data Reviewed:   CBC: Recent Labs  Lab 03/06/23 0500 03/07/23 0538 03/08/23 1038 03/09/23 0448 03/10/23 0322 03/11/23 0511  WBC 15.2*  --  11.9* 12.9* 9.8 9.7  HGB 8.2* 8.9* 8.2* 8.8* 8.9* 10.0*  HCT 24.5* 27.2* 25.6* 26.6* 27.7* 32.1*  MCV 88.1  --  91.8 89.9 94.2 94.7  PLT 177  --  188 206 195 207   Basic Metabolic Panel: Recent Labs  Lab 03/07/23 0538 03/08/23 1038 03/09/23 0448 03/10/23 0322 03/11/23 0511  NA 140 139 139 141 142  K 3.9 3.7 3.6 3.3* 4.5  CL 109 109 107 106 109  CO2 19* 19* 21* 23 24  GLUCOSE 213* 228* 200* 157* 192*  BUN 40* 31* 30* 33* 33*  CREATININE 1.77* 1.42* 1.36* 1.45*  1.79*  CALCIUM 8.6* 8.4* 8.7* 8.8* 8.8*  PHOS 2.5  --   --   --   --    GFR: Estimated Creatinine Clearance: 31 mL/min (A) (by C-G formula based on SCr of 1.79 mg/dL (H)). Liver Function Tests: Recent Labs  Lab 03/07/23 0538  ALBUMIN 2.0*   No results for input(s): "LIPASE", "AMYLASE" in the last 168 hours. No results for input(s): "AMMONIA" in the last 168 hours. Coagulation Profile: No results for input(s): "INR", "PROTIME" in the last 168 hours.  Cardiac Enzymes: No results for input(s): "CKTOTAL", "CKMB", "CKMBINDEX", "TROPONINI" in the last 168 hours. BNP (last 3 results) No results for input(s): "PROBNP" in the last 8760 hours. HbA1C: No results for input(s): "HGBA1C" in the last 72 hours. CBG: Recent Labs  Lab 03/10/23 0740 03/10/23 1157 03/10/23 1550 03/10/23 2057 03/11/23 0751  GLUCAP 132* 170* 148* 164* 189*   Lipid Profile: No results for input(s): "CHOL", "HDL", "LDLCALC", "TRIG", "CHOLHDL", "LDLDIRECT" in the last 72 hours. Thyroid Function Tests: No results for input(s): "TSH", "T4TOTAL", "FREET4", "T3FREE", "THYROIDAB" in the last 72 hours. Anemia Panel: No results for input(s): "VITAMINB12", "FOLATE", "FERRITIN", "TIBC", "IRON", "RETICCTPCT" in the last 72 hours. Urine analysis:    Component Value Date/Time   COLORURINE YELLOW 02/28/2023 0951   APPEARANCEUR HAZY (  A) 02/28/2023 0951   LABSPEC 1.016 02/28/2023 0951   PHURINE 5.0 02/28/2023 0951   GLUCOSEU NEGATIVE 02/28/2023 0951   HGBUR MODERATE (A) 02/28/2023 0951   BILIRUBINUR NEGATIVE 02/28/2023 0951   KETONESUR 20 (A) 02/28/2023 0951   PROTEINUR NEGATIVE 02/28/2023 0951   UROBILINOGEN 0.2 10/16/2008 1546   NITRITE NEGATIVE 02/28/2023 0951   LEUKOCYTESUR NEGATIVE 02/28/2023 0951   Sepsis Labs: @LABRCNTIP (procalcitonin:4,lacticidven:4)  ) Recent Results (from the past 240 hour(s))  Aerobic/Anaerobic Culture w Gram Stain (surgical/deep wound)     Status: None   Collection Time: 03/02/23   5:03 PM   Specimen: Wound; Bile  Result Value Ref Range Status   Specimen Description WOUND  Final   Special Requests bile, gallbladder  Final   Gram Stain NO WBC SEEN FEW GRAM NEGATIVE RODS   Final   Culture   Final    MODERATE ESCHERICHIA COLI NO ANAEROBES ISOLATED Performed at [facility], [address] [place], [place] [address]    Report Status 03/07/2023 FINAL  Final   Organism ID, Bacteria ESCHERICHIA COLI  Final      Susceptibility   Escherichia coli - MIC*    AMPICILLIN 16 INTERMEDIATE Intermediate     CEFEPIME <=0.12 SENSITIVE Sensitive     CEFTAZIDIME <=1 SENSITIVE Sensitive     CEFTRIAXONE <=0.25 SENSITIVE Sensitive     CIPROFLOXACIN <=0.25 SENSITIVE Sensitive     GENTAMICIN <=1 SENSITIVE Sensitive     IMIPENEM <=0.25 SENSITIVE Sensitive     TRIMETH/SULFA <=20 SENSITIVE Sensitive     AMPICILLIN/SULBACTAM 4 SENSITIVE Sensitive     PIP/TAZO <=4 SENSITIVE Sensitive     * MODERATE ESCHERICHIA COLI     Radiology Studies: No results found.   Scheduled Meds:  allopurinol  300 mg Oral Daily   amoxicillin-clavulanate  1 tablet Oral Q12H   vitamin C  1,000 mg Oral Daily   atorvastatin  80 mg Oral Daily   docusate sodium  100 mg Oral BID   [START ON 03/12/2023] empagliflozin  10 mg Oral Daily   feeding supplement (GLUCERNA SHAKE)  237 mL Oral TID BM   insulin aspart  0-9 Units Subcutaneous TID WC   insulin glargine-yfgn  10 Units Subcutaneous Daily   latanoprost  1 drop Both Eyes QHS   melatonin  5 mg Oral QHS   metoprolol succinate  50 mg Oral Daily   multivitamin with minerals  1 tablet Oral Daily   polyethylene glycol  17 g Oral Daily   QUEtiapine  25 mg Oral QHS   saccharomyces boulardii  250 mg Oral BID   sodium chloride flush  5 mL Intracatheter Q8H   [START ON 03/12/2023] spironolactone  12.5 mg Oral Daily   Continuous Infusions:  sodium chloride Stopped (02/24/23 0824)     LOS: 18 days    Time spent:    Zannie Cove, MD [facility]   03/11/2023, 11:46 AM

## 2023-03-11 NOTE — Progress Notes (Addendum)
   Patient Name: Nathan [name] Date of Encounter: 03/11/2023 [facility] Cardiologist: Donato Schultz, MD   Interval Summary  .    Confusion  Vital Signs .    Vitals:   03/10/23 1932 03/11/23 0509 03/11/23 0745 03/11/23 0900  BP: 113/63 121/63 110/73 130/74  Pulse: 85 95    Resp: 20 16    Temp: (!) 97.3 F (36.3 C) (!) 97.4 F (36.3 C) 98.1 F (36.7 C)   TempSrc: Oral Oral Oral   SpO2: 96% 99%    Weight:  63.4 kg    Height:        Intake/Output Summary (Last 24 hours) at 03/11/2023 1001 Last data filed at 03/11/2023 0900 Gross per 24 hour  Intake 242 ml  Output 750 ml  Net -508 ml      03/11/2023    5:09 AM 03/10/2023    3:10 AM 03/09/2023    2:47 AM  Last 3 Weights  Weight (lbs) 139 lb 12.8 oz 143 lb 4.8 oz 143 lb 8 oz  Weight (kg) 63.413 kg 65 kg 65.091 kg      Telemetry/ECG    Atrial fibrillation rate controlled- Personally Reviewed  Physical Exam .   GEN: No acute distress.  Confusion Neck: No JVD Cardiac: IRRR, soft systolic murmur,no rubs, or gallops.  Respiratory: Clear to auscultation bilaterally. GI: Soft, nontender, non-distended  MS: No edema  Assessment & Plan .     Acute systolic heart failure CABG 2010 CAD - EF from 20% up to 40%. -Recent addition of spironolactone 12.5 mg a day, no Entresto due to AKI, continue with Toprol-XL 50 and Jardiance 10.  No need for loop diuretic.  Appears euvolemic.  Follow as outpatient.  Repeat echo as outpatient.  At this time, forego ischemic evaluation.  Delirium noted.  Acute cholecystitis with PERC drain 03/02/2023 Hemorrhagic anemia in the right adductor musculature hemoglobin down to 6.9 previously with 3 units of blood, currently stable. -Would not resume long-term anticoagulation.  Can always reconsider as outpatient.  Aortic valve replacement in 2010-stable  Paroxysmal atrial fibrillation - Rate controlled in the 80s no anticoagulation because of severe hemorrhage.  For questions or  updates, please contact [facility] Please consult [email or web address] for contact info under        Signed, Donato Schultz, MD

## 2023-03-11 NOTE — Progress Notes (Signed)
Mobility Specialist Progress Note    03/11/23 1616  Mobility  Activity Ambulated with assistance in hallway;Ambulated with assistance to bathroom  Level of Assistance Minimal assist, patient does 75% or more  Assistive Device Front wheel walker  Distance Ambulated (ft) 200 ft  Activity Response Tolerated fair  Mobility Referral Yes  $Mobility charge 1 Mobility  Mobility Specialist Start Time (ACUTE ONLY) 1556  Mobility Specialist Stop Time (ACUTE ONLY) 1616  Mobility Specialist Time Calculation (min) (ACUTE ONLY) 20 min   Pre-Mobility: 94 HR, 98% SpO2 During Mobility: 136 HR  Pt received sitting EOB and tearful w/ visitor. With encouragement, pt agreeable. No complaints. Returned to void in bathroom with RN and visitor present.   [place] Mobility Specialist  Please Neurosurgeon or Rehab Office at [phone]

## 2023-03-11 NOTE — Plan of Care (Signed)
Patient has made some progression today. He walked the hall with the walker and his wife. Patient has minimal confusion spells today. Heart Rate elevated to the 130s on his walk. He became briefly SOB but recovred well with no oxygen. Patient has remained stable and will continue to monitor.

## 2023-03-12 DIAGNOSIS — I21A1 Myocardial infarction type 2: Secondary | ICD-10-CM | POA: Diagnosis not present

## 2023-03-12 LAB — GLUCOSE, CAPILLARY
Glucose-Capillary: 107 mg/dL — ABNORMAL HIGH (ref 70–99)
Glucose-Capillary: 182 mg/dL — ABNORMAL HIGH (ref 70–99)

## 2023-03-12 LAB — BASIC METABOLIC PANEL
Anion gap: 13 (ref 5–15)
BUN: 32 mg/dL — ABNORMAL HIGH (ref 8–23)
CO2: 20 mmol/L — ABNORMAL LOW (ref 22–32)
Calcium: 8.4 mg/dL — ABNORMAL LOW (ref 8.9–10.3)
Chloride: 105 mmol/L (ref 98–111)
Creatinine, Ser: 1.37 mg/dL — ABNORMAL HIGH (ref 0.61–1.24)
GFR, Estimated: 53 mL/min — ABNORMAL LOW (ref >60)
Glucose, Bld: 126 mg/dL — ABNORMAL HIGH (ref 70–99)
Potassium: 3.8 mmol/L (ref 3.5–5.1)
Sodium: 138 mmol/L (ref 135–145)

## 2023-03-12 NOTE — Progress Notes (Signed)
   Patient Name: Nathan [name] Date of Encounter: 03/12/2023 [facility] Cardiologist: Donato Schultz, MD   Interval Summary  .    Still with some mild confusion  Vital Signs .    Vitals:   03/11/23 1306 03/11/23 1618 03/11/23 2112 03/12/23 0313  BP: 108/66 (!) 142/93 117/72 111/76  Pulse: 80 (!) 102 88   Resp: 18 20 19 18   Temp:  (!) 97.3 F (36.3 C) (!) 97.4 F (36.3 C) 97.6 F (36.4 C)  TempSrc:  Oral Oral Oral  SpO2:  94% 97% 99%  Weight:      Height:        Intake/Output Summary (Last 24 hours) at 03/12/2023 1041 Last data filed at 03/12/2023 0930 Gross per 24 hour  Intake 120 ml  Output 970 ml  Net -850 ml      03/11/2023    5:09 AM 03/10/2023    3:10 AM 03/09/2023    2:47 AM  Last 3 Weights  Weight (lbs) 139 lb 12.8 oz 143 lb 4.8 oz 143 lb 8 oz  Weight (kg) 63.413 kg 65 kg 65.091 kg      Telemetry/ECG    A-fib, occasional rapid response- Personally Reviewed  Physical Exam .   GEN: No acute distress.  Some confusion Neck: No JVD Cardiac: IRRR, no murmurs, rubs, or gallops.  Respiratory: Clear to auscultation bilaterally. GI: Soft, nontender, non-distended  MS: No edema  Assessment & Plan .     Paroxysmal atrial fibrillation  - When walking heart rate 130s.  At rest heart rate currently in the 80s. -Toprol 50 mg a day -Okay to continue this regimen.  Acute cholecystitis with percutaneous drain -Had hemorrhagic anemia in the right adductor musculature as well hemoglobin down to 6.9 previously with 3 units of blood currently stable. - Because of this, I would not resume long-term anticoagulation.  We can always reconsider down the road.  Aortic valve replacement 2010 - Currently stable echocardiogram reviewed.  Acute systolic heart failure with CABG 2010 CAD - EF from 20% up to 40% - Recent addition of spironolactone 12.5 mg a day.  No Entresto due to AKI. - Continue with Toprol-XL 50 mg - Jardiance 10 mg - No need for loop diuretic.  For  questions or updates, please contact [facility] Please consult [email or web address] for contact info under        Signed, Donato Schultz, MD

## 2023-03-12 NOTE — Plan of Care (Signed)
Patient has been pleasant. Takes medications crushed in applesauce. Abdominal binder in place. Biliary tube intact. No complains of pain and resting. Will continue to monitor patient.

## 2023-03-12 NOTE — Progress Notes (Signed)
Pt had 7 bts NSVT on telemetry.  Pt asymptomatic.  Will continue to monitor.

## 2023-03-12 NOTE — Progress Notes (Signed)
PROGRESS NOTE    Nathan [name]  ZOX:[id number] DOB: March 11, 1946 DOA: 02/20/2023 PCP: Garlan Fillers, MD  77 y.o. M with DM, HTN, CAD s/p CABG and AVR, hx OSA, and history severe necrotizing fasciitis few years ago who presented with chest discomfort. High sensitive troponin 12, 31, 4,527, 6,445   -placed on nitroglycerin drip for pain control and heparin drip for anticoagulation.  8/14 echo: EF 20-25% 8/20 echo: EF 25-30% with RWMA, no thrombus; AV repair noted   8/13: Admitted on heparin to Cardiology for NSTEMI --> evening of HD1, developed respiratory distress and rales requiring BiPAP and ICU transfer 8/14: Developed delirium requiring Precedex; Transaminitis noted 8/15: Cr peaks at 2.15 8/16: Weaned off Precedex; new onset Atrial fibrillation noted 8/17: Leukocytosis noted 8/18: Antibiotics started for Pneumonia based on CXR 8/20: Hgb down to 8s 8/21: ID consulted; CT C/A/P shows abnormal gallbladder, new hematoma; heparin stopped 8/22: IR and Gen Surg consulted; HIDA confirms cholecystitis; transfused 2 units; Perc chole drain placed 8/23: Worsening agitation/delirium 8/24 further droop in Hgb down to 6,9, hypotension. Stopped heparin drip and ordered PRBC transfusion x1.  8/25 mentation has improved but not back to baseline, continues to use intermittently soft wrist restrains.  8/26 encephalopathy slowly improving, off restrains and improved po intake.  8/27 patient pulled his biliary drain and will need to be replaced per IR. Antibiotic therapy changed to oral Augmentin. Encephalopathy is improving, continue off restrains.  8/28: Drain exchanged in IR   Subjective: -Feels weak and tired all over, some confusion, overall improving  Assessment and Plan:  NSTEMI (non-ST elevated myocardial infarction) (HCC) Coronary artery disease sp CABG 2010 Echocardiogram with new reduction on LV systolic function, to 20 to 25%, global hypokinesis.  -Followed by cardiology, no plans  for invasive workup in the setting of delirium renal failure frailty etc. -conservative management -Continue aspirin, statin, isosorbide and metoprolol  Acute on chronic systolic CHF  Echo w/ EF 20 to 25%, with global hypokinesis, mild dilatated LV cavity, mild LVH, RV systolic function with moderate reduction -GDMT was limited by hypotension and AKI -Repeat echo 03/06/2023 noted improvement in EF to 35-40% -Volume status appears stable, blood pressure improved, -creatinine improving, resume Jardiance and Aldactone  Acute hypoxemic respiratory failure due to acute cardiogenic pulmonary edema. Required non invasive mechanical ventilation on admission.  -resolved  Acute metabolic encephalopathy -Delirium in the setting of cholecystitis, pain and prolonged hospitalization -PRN haloperidol and Quetiapine increased to 50 Mg nightly -Mental status slowly improving -Telemetry sitter discontinued, discharge planning -SNF tomorrow  Acute cholecystitis -Imaging 8/21 showed signs of gallstones and cholecystitis, confirmed with HIDA. -Perc tube placed 8/22 -Plan for 6 weeks drain, follow up in IR clinic -8/27 transition to oral Augmentin, will need to complete 3 weeks total, and final duration pending repeat imaging.  -8/27 drain pulled by patient, drain exchanged 8/28, FU with Radiology in 6 weeks and Gen Surgery in 1-65months  Acute posthemorrhagic anemia 8/21 CT with incompletely imaged heterogeneous mixed hyper and hypodensity in the region of the right adductor musculature measuring 9,0 x 8,6 cm, hematoma, Hb dropped to 6.9 -sp 3 units PRBC last 8/24 -hb stable, continue to trend -Anticoagulation discontinued, reconsider as outpatient in 1 to 2 months  Acute kidney injury on CKD stage 3a, GFR 45-59 ml/min (HCC) AKI, hypokalemia.  -Improving  Dyslipidemia Continue with statin therapy.   Paroxysmal atrial fibrillation (HCC) New onset.  CHA2DS2-Vasc 6 (age, HTN, CHF, DM, vascular  disease).  Continue rate control with metoprolol, holding  anticoagulation due to acute bleed (left thigh hematoma)   S/P AVR  Type 2 diabetes mellitus -CBGs poorly controlled, on Semglee, continue Jardiance   DVT prophylaxis: SCDS, anticoag held w/ Large hematoma and anemia Code Status: DNR Family Communication: None present, called and updated spouse Disposition Plan: SNF tomorrow if stable  Consultants:    Procedures:   Antimicrobials:    Objective: Vitals:   03/11/23 1306 03/11/23 1618 03/11/23 2112 03/12/23 0313  BP: 108/66 (!) 142/93 117/72 111/76  Pulse: 80 (!) 102 88   Resp: 18 20 19 18   Temp:  (!) 97.3 F (36.3 C) (!) 97.4 F (36.3 C) 97.6 F (36.4 C)  TempSrc:  Oral Oral Oral  SpO2:  94% 97% 99%  Weight:      Height:        Intake/Output Summary (Last 24 hours) at 03/12/2023 1112 Last data filed at 03/12/2023 1057 Gross per 24 hour  Intake 120 ml  Output 1170 ml  Net -1050 ml   Filed Weights   03/09/23 0247 03/10/23 0310 03/11/23 0509  Weight: 65.1 kg 65 kg 63.4 kg    Examination:  Gen: Frail elderly male sitting up in bed, AAO x 2, moderate cognitive deficits, improving confusion HEENT: No JVD CVS: S1-S2, regular rhythm Lungs: Clear anteriorly Abdomen: Soft, nontender, abdominal binder on, gallbladder drain noted, bowel sounds present  Extremities: No edema Skin: no new rashes on exposed skin    Data Reviewed:   CBC: Recent Labs  Lab 03/06/23 0500 03/07/23 0538 03/08/23 1038 03/09/23 0448 03/10/23 0322 03/11/23 0511  WBC 15.2*  --  11.9* 12.9* 9.8 9.7  HGB 8.2* 8.9* 8.2* 8.8* 8.9* 10.0*  HCT 24.5* 27.2* 25.6* 26.6* 27.7* 32.1*  MCV 88.1  --  91.8 89.9 94.2 94.7  PLT 177  --  188 206 195 207   Basic Metabolic Panel: Recent Labs  Lab 03/07/23 0538 03/08/23 1038 03/09/23 0448 03/10/23 0322 03/11/23 0511 03/12/23 0350  NA 140 139 139 141 142 138  K 3.9 3.7 3.6 3.3* 4.5 3.8  CL 109 109 107 106 109 105  CO2 19* 19* 21* 23 24 20*   GLUCOSE 213* 228* 200* 157* 192* 126*  BUN 40* 31* 30* 33* 33* 32*  CREATININE 1.77* 1.42* 1.36* 1.45* 1.79* 1.37*  CALCIUM 8.6* 8.4* 8.7* 8.8* 8.8* 8.4*  PHOS 2.5  --   --   --   --   --    GFR: Estimated Creatinine Clearance: 40.5 mL/min (A) (by C-G formula based on SCr of 1.37 mg/dL (H)). Liver Function Tests: Recent Labs  Lab 03/07/23 0538  ALBUMIN 2.0*   No results for input(s): "LIPASE", "AMYLASE" in the last 168 hours. No results for input(s): "AMMONIA" in the last 168 hours. Coagulation Profile: No results for input(s): "INR", "PROTIME" in the last 168 hours.  Cardiac Enzymes: No results for input(s): "CKTOTAL", "CKMB", "CKMBINDEX", "TROPONINI" in the last 168 hours. BNP (last 3 results) No results for input(s): "PROBNP" in the last 8760 hours. HbA1C: No results for input(s): "HGBA1C" in the last 72 hours. CBG: Recent Labs  Lab 03/11/23 0751 03/11/23 1234 03/11/23 1648 03/11/23 2136 03/12/23 0742  GLUCAP 189* 209* 130* 149* 107*   Lipid Profile: No results for input(s): "CHOL", "HDL", "LDLCALC", "TRIG", "CHOLHDL", "LDLDIRECT" in the last 72 hours. Thyroid Function Tests: No results for input(s): "TSH", "T4TOTAL", "FREET4", "T3FREE", "THYROIDAB" in the last 72 hours. Anemia Panel: No results for input(s): "VITAMINB12", "FOLATE", "FERRITIN", "TIBC", "IRON", "RETICCTPCT" in the last  72 hours. Urine analysis:    Component Value Date/Time   COLORURINE YELLOW 02/28/2023 0951   APPEARANCEUR HAZY (A) 02/28/2023 0951   LABSPEC 1.016 02/28/2023 0951   PHURINE 5.0 02/28/2023 0951   GLUCOSEU NEGATIVE 02/28/2023 0951   HGBUR MODERATE (A) 02/28/2023 0951   BILIRUBINUR NEGATIVE 02/28/2023 0951   KETONESUR 20 (A) 02/28/2023 0951   PROTEINUR NEGATIVE 02/28/2023 0951   UROBILINOGEN 0.2 10/16/2008 1546   NITRITE NEGATIVE 02/28/2023 0951   LEUKOCYTESUR NEGATIVE 02/28/2023 0951   Sepsis Labs: @LABRCNTIP (procalcitonin:4,lacticidven:4)  ) Recent Results (from the past  240 hour(s))  Aerobic/Anaerobic Culture w Gram Stain (surgical/deep wound)     Status: None   Collection Time: 03/02/23  5:03 PM   Specimen: Wound; Bile  Result Value Ref Range Status   Specimen Description WOUND  Final   Special Requests bile, gallbladder  Final   Gram Stain NO WBC SEEN FEW GRAM NEGATIVE RODS   Final   Culture   Final    MODERATE ESCHERICHIA COLI NO ANAEROBES ISOLATED Performed at [facility], [address], [place], [place] [address]    Report Status 03/07/2023 FINAL  Final   Organism ID, Bacteria ESCHERICHIA COLI  Final      Susceptibility   Escherichia coli - MIC*    AMPICILLIN 16 INTERMEDIATE Intermediate     CEFEPIME <=0.12 SENSITIVE Sensitive     CEFTAZIDIME <=1 SENSITIVE Sensitive     CEFTRIAXONE <=0.25 SENSITIVE Sensitive     CIPROFLOXACIN <=0.25 SENSITIVE Sensitive     GENTAMICIN <=1 SENSITIVE Sensitive     IMIPENEM <=0.25 SENSITIVE Sensitive     TRIMETH/SULFA <=20 SENSITIVE Sensitive     AMPICILLIN/SULBACTAM 4 SENSITIVE Sensitive     PIP/TAZO <=4 SENSITIVE Sensitive     * MODERATE ESCHERICHIA COLI     Radiology Studies: No results found.   Scheduled Meds:  allopurinol  300 mg Oral Daily   amoxicillin-clavulanate  1 tablet Oral Q12H   vitamin C  1,000 mg Oral Daily   atorvastatin  80 mg Oral Daily   docusate sodium  100 mg Oral BID   empagliflozin  10 mg Oral Daily   feeding supplement (GLUCERNA SHAKE)  237 mL Oral TID BM   insulin aspart  0-9 Units Subcutaneous TID WC   insulin glargine-yfgn  10 Units Subcutaneous Daily   latanoprost  1 drop Both Eyes QHS   melatonin  5 mg Oral QHS   metoprolol succinate  50 mg Oral Daily   multivitamin with minerals  1 tablet Oral Daily   polyethylene glycol  17 g Oral Daily   QUEtiapine  50 mg Oral QPM   saccharomyces boulardii  250 mg Oral BID   sodium chloride flush  5 mL Intracatheter Q8H   spironolactone  12.5 mg Oral Daily   Continuous Infusions:  sodium chloride Stopped (02/24/23  0824)     LOS: 19 days    Time spent:    Zannie Cove, MD [facility]   03/12/2023, 11:12 AM

## 2023-03-13 DIAGNOSIS — G9340 Encephalopathy, unspecified: Secondary | ICD-10-CM | POA: Diagnosis not present

## 2023-03-13 DIAGNOSIS — B20 Human immunodeficiency virus [HIV] disease: Secondary | ICD-10-CM | POA: Diagnosis not present

## 2023-03-13 DIAGNOSIS — K810 Acute cholecystitis: Secondary | ICD-10-CM | POA: Diagnosis not present

## 2023-03-13 DIAGNOSIS — I129 Hypertensive chronic kidney disease with stage 1 through stage 4 chronic kidney disease, or unspecified chronic kidney disease: Secondary | ICD-10-CM | POA: Diagnosis not present

## 2023-03-13 DIAGNOSIS — E1122 Type 2 diabetes mellitus with diabetic chronic kidney disease: Secondary | ICD-10-CM | POA: Diagnosis not present

## 2023-03-13 DIAGNOSIS — Z7401 Bed confinement status: Secondary | ICD-10-CM | POA: Diagnosis not present

## 2023-03-13 DIAGNOSIS — E119 Type 2 diabetes mellitus without complications: Secondary | ICD-10-CM | POA: Diagnosis not present

## 2023-03-13 DIAGNOSIS — K819 Cholecystitis, unspecified: Secondary | ICD-10-CM | POA: Diagnosis not present

## 2023-03-13 DIAGNOSIS — R262 Difficulty in walking, not elsewhere classified: Secondary | ICD-10-CM | POA: Diagnosis not present

## 2023-03-13 DIAGNOSIS — I5022 Chronic systolic (congestive) heart failure: Secondary | ICD-10-CM | POA: Diagnosis not present

## 2023-03-13 DIAGNOSIS — I10 Essential (primary) hypertension: Secondary | ICD-10-CM | POA: Diagnosis not present

## 2023-03-13 DIAGNOSIS — I214 Non-ST elevation (NSTEMI) myocardial infarction: Secondary | ICD-10-CM | POA: Diagnosis not present

## 2023-03-13 DIAGNOSIS — R531 Weakness: Secondary | ICD-10-CM | POA: Diagnosis not present

## 2023-03-13 DIAGNOSIS — I21A1 Myocardial infarction type 2: Secondary | ICD-10-CM | POA: Diagnosis not present

## 2023-03-13 DIAGNOSIS — N183 Chronic kidney disease, stage 3 unspecified: Secondary | ICD-10-CM | POA: Diagnosis not present

## 2023-03-13 DIAGNOSIS — J9600 Acute respiratory failure, unspecified whether with hypoxia or hypercapnia: Secondary | ICD-10-CM | POA: Diagnosis not present

## 2023-03-13 DIAGNOSIS — I25810 Atherosclerosis of coronary artery bypass graft(s) without angina pectoris: Secondary | ICD-10-CM | POA: Diagnosis not present

## 2023-03-13 DIAGNOSIS — I4891 Unspecified atrial fibrillation: Secondary | ICD-10-CM | POA: Diagnosis not present

## 2023-03-13 DIAGNOSIS — R41841 Cognitive communication deficit: Secondary | ICD-10-CM | POA: Diagnosis not present

## 2023-03-13 DIAGNOSIS — M109 Gout, unspecified: Secondary | ICD-10-CM | POA: Diagnosis not present

## 2023-03-13 DIAGNOSIS — M545 Low back pain, unspecified: Secondary | ICD-10-CM | POA: Diagnosis not present

## 2023-03-13 DIAGNOSIS — N1831 Chronic kidney disease, stage 3a: Secondary | ICD-10-CM | POA: Diagnosis not present

## 2023-03-13 DIAGNOSIS — E785 Hyperlipidemia, unspecified: Secondary | ICD-10-CM | POA: Diagnosis not present

## 2023-03-13 DIAGNOSIS — M6281 Muscle weakness (generalized): Secondary | ICD-10-CM | POA: Diagnosis not present

## 2023-03-13 DIAGNOSIS — G9341 Metabolic encephalopathy: Secondary | ICD-10-CM | POA: Diagnosis not present

## 2023-03-13 LAB — BASIC METABOLIC PANEL
Anion gap: 6 (ref 5–15)
BUN: 33 mg/dL — ABNORMAL HIGH (ref 8–23)
CO2: 23 mmol/L (ref 22–32)
Calcium: 8.5 mg/dL — ABNORMAL LOW (ref 8.9–10.3)
Chloride: 107 mmol/L (ref 98–111)
Creatinine, Ser: 1.5 mg/dL — ABNORMAL HIGH (ref 0.61–1.24)
GFR, Estimated: 48 mL/min — ABNORMAL LOW (ref >60)
Glucose, Bld: 147 mg/dL — ABNORMAL HIGH (ref 70–99)
Potassium: 3.8 mmol/L (ref 3.5–5.1)
Sodium: 136 mmol/L (ref 135–145)

## 2023-03-13 LAB — GLUCOSE, CAPILLARY
Glucose-Capillary: 225 mg/dL — ABNORMAL HIGH (ref 70–99)
Glucose-Capillary: 171 mg/dL — ABNORMAL HIGH (ref 70–99)

## 2023-03-13 LAB — CBC
HCT: 32.1 % — ABNORMAL LOW (ref 39.0–52.0)
Hemoglobin: 10.4 g/dL — ABNORMAL LOW (ref 13.0–17.0)
MCH: 31.1 pg (ref 26.0–34.0)
MCHC: 32.4 g/dL (ref 30.0–36.0)
MCV: 96.1 fL (ref 80.0–100.0)
Platelets: 180 10*3/uL (ref 150–400)
RBC: 3.34 MIL/uL — ABNORMAL LOW (ref 4.22–5.81)
RDW: 21.6 % — ABNORMAL HIGH (ref 11.5–15.5)
WBC: 9.3 10*3/uL (ref 4.0–10.5)
nRBC: 0 % (ref 0.0–0.2)

## 2023-03-13 MED ORDER — AMOXICILLIN-POT CLAVULANATE 875-125 MG PO TABS: 1.0000 | ORAL_TABLET | Freq: Two times a day (BID) | ORAL | Status: AC

## 2023-03-13 MED ORDER — EMPAGLIFLOZIN 10 MG PO TABS: 10.0000 mg | ORAL_TABLET | Freq: Every day | ORAL | Status: DC

## 2023-03-13 MED ORDER — METOPROLOL SUCCINATE ER 50 MG PO TB24: 50.0000 mg | ORAL_TABLET | Freq: Every day | ORAL | Status: DC

## 2023-03-13 MED ORDER — ATORVASTATIN CALCIUM 80 MG PO TABS: 80.0000 mg | ORAL_TABLET | Freq: Every day | ORAL | Status: DC

## 2023-03-13 MED ORDER — QUETIAPINE FUMARATE 50 MG PO TABS: 50.0000 mg | ORAL_TABLET | Freq: Every evening | ORAL | Status: DC

## 2023-03-13 MED ORDER — SPIRONOLACTONE 25 MG PO TABS: 12.5000 mg | ORAL_TABLET | Freq: Every day | ORAL | Status: DC

## 2023-03-13 MED ORDER — SACCHAROMYCES BOULARDII 250 MG PO CAPS: 250.0000 mg | ORAL_CAPSULE | Freq: Two times a day (BID) | ORAL | Status: DC

## 2023-03-13 NOTE — Progress Notes (Signed)
   Patient Name: Nathan [name] Date of Encounter: 03/13/2023 [facility] Cardiologist: Donato Schultz, MD   Interval Summary  .    Mildly confused but pleasant nursing notes reviewed  Vital Signs .    Vitals:   03/12/23 1716 03/12/23 2015 03/13/23 0236 03/13/23 0703  BP: 114/69 120/70 97/65 124/86  Pulse: 85 84 68   Resp: 20 18 20    Temp: 97.6 F (36.4 C) 97.6 F (36.4 C) 97.6 F (36.4 C)   TempSrc: Oral Oral Oral   SpO2:  93% 93%   Weight:   62.8 kg   Height:        Intake/Output Summary (Last 24 hours) at 03/13/2023 0831 Last data filed at 03/13/2023 0658 Gross per 24 hour  Intake 609 ml  Output 1025 ml  Net -416 ml      03/13/2023    2:36 AM 03/12/2023    7:01 AM 03/11/2023    5:09 AM  Last 3 Weights  Weight (lbs) 138 lb 7.2 oz 143 lb 4.8 oz 139 lb 12.8 oz  Weight (kg) 62.8 kg 65 kg 63.413 kg      Telemetry/ECG    Atrial fibrillation 90s to 100- Personally Reviewed  Physical Exam .   GEN: No acute distress.  Confusion Neck: No JVD Cardiac: IRRR, no murmurs, rubs, or gallops.  Respiratory: Clear to auscultation bilaterally. GI: Soft, nontender, non-distended  MS: No edema  Assessment & Plan .     77 year old with non-ST elevation myocardial infarction Coronary artery disease status post CABG 2010 -Atorvastatin 80 mg -No aspirin secondary to adductor bleed  Acute systolic heart failure EF 20 to 25% with global hypokinesis -Ejection fraction improved from 20% up to 40%, recent addition of spironolactone 12.5 mg a day-potassium 3.8.  No Entresto secondary to AKI, continue with Toprol-XL 50 mg which also helps with rate control of atrial fibrillation, Jardiance 10 mg look for any signs of urinary tract infection, no current need for loop diuretics  Acute cholecystitis with percutaneous drain -Abdominal binder to help maintain stability given his confusion  Anemia with right adductor musculature bleed status post 3 units of blood -Avoiding  anticoagulation at this time reassess as outpatient  Acute kidney injury on top of chronic kidney disease stage III a -Creatinine improved to 1.5  Paroxysmal atrial fibrillation - CHA2DS2-VASc 6, metoprolol, no anticoagulation secondary to acute bleed -Toprol XL 50 mg for rate control -Telemetry does show heart rates in the 130s upon movement, will tolerate.  Aortic valve replacement - In the setting of CABG  Delirium - Slowly improving, affected by infection  For questions or updates, please contact [facility] Please consult [email or web address] for contact info under        Signed, Donato Schultz, MD

## 2023-03-13 NOTE — TOC Progression Note (Signed)
Transition of Care [facility]) - Progression Note    Patient Details  Name: Nathan [name] MRN: [id number] Date of Birth: Feb 14, 1946  Transition of Care [facility]) CM/SW Contact  Delilah Shan, LCSWA Phone Number: 03/13/2023, 9:39 AM  Clinical Narrative:     [facility] confirmed they can accept patient today if medically ready for dc. CSW informed MD. CSW will continue to follow.  Expected Discharge Plan: Skilled Nursing Facility Barriers to Discharge: Continued Medical Work up  Expected Discharge Plan and Services In-house Referral: Clinical Social Work Discharge Planning Services: CM Consult   Living arrangements for the past 2 months: Single Family Home                 DME Arranged: Walker rolling DME Agency: [employer] Date DME Agency Contacted: 02/27/23 Time DME Agency Contacted: 1302 Representative spoke with at DME Agency: Ian Malkin HH Arranged: PT HH Agency: [facility] Date [facility] Agency Contacted: 02/27/23 Time HH Agency Contacted: 1302 Representative spoke with at [facility] Agency: Tresa Endo   Social Determinants of Health (SDOH) Interventions SDOH Screenings   Food Insecurity: No Food Insecurity (02/25/2023)  Housing: Low Risk  (02/25/2023)  Transportation Needs: No Transportation Needs (02/25/2023)  Utilities: Not At Risk (02/25/2023)  Tobacco Use: Low Risk  (02/27/2023)    Readmission Risk Interventions    02/22/2023    2:58 PM  Readmission Risk Prevention Plan  Transportation Screening Complete  HRI or Home Care Consult Complete  Social Work Consult for Recovery Care Planning/Counseling Complete  Palliative Care Screening Not Applicable  Medication Review Oceanographer) Referral to Pharmacy

## 2023-03-13 NOTE — Progress Notes (Signed)
Called [facility] to give report and was told that a nurse who would be receiving him would call me back.  Hinton Dyer, RN

## 2023-03-13 NOTE — TOC Transition Note (Signed)
Transition of Care [facility]) - CM/SW Discharge Note   Patient Details  Name: Nathan [name] MRN: [id number] Date of Birth: [birth year]  Transition of Care [facility]) CM/SW Contact:  Delilah Shan, LCSWA Phone Number: 03/13/2023, 11:27 AM   Clinical Narrative:     Patient will DC to: [facility]  Anticipated DC date: 03/13/2023  Family notified: Gavin Pound  Transport by: Sharin Mons  ?  Per MD patient ready for DC to [facility] . RN, patient, patient's family, and facility notified of DC. Discharge Summary sent to facility. RN given number for report [phone] RM# [phone]. DC packet on chart. DNR signed by MD attached to patients DC packet.Ambulance transport requested for patient.  CSW signing off.   Final next level of care: Skilled Nursing Facility Barriers to Discharge: No Barriers Identified   Patient Goals and CMS Choice CMS Medicare.gov Compare Post Acute Care list provided to:: Patient Represenative (must comment) (spouse) Choice offered to / list presented to : Spouse  Discharge Placement                Patient chooses bed at: [facility] Patient to be transferred to facility by: [facility] Name of family member notified: Gavin Pound Patient and family notified of of transfer: 03/13/23  Discharge Plan and Services Additional resources added to the After Visit Summary for   In-house Referral: Clinical Social Work Discharge Planning Services: CM Consult            DME Arranged: Dan Humphreys rolling DME Agency: [employer] Date DME Agency Contacted: 02/27/23 Time DME Agency Contacted: 1302 Representative spoke with at DME Agency: Ian Malkin HH Arranged: PT HH Agency: [facility] Date [facility] Agency Contacted: 02/27/23 Time HH Agency Contacted: 1302 Representative spoke with at [facility] Agency: Tresa Endo  Social Determinants of Health (SDOH) Interventions SDOH Screenings   Food Insecurity: No Food Insecurity (02/25/2023)  Housing: Low Risk  (02/25/2023)  Transportation Needs: No  Transportation Needs (02/25/2023)  Utilities: Not At Risk (02/25/2023)  Tobacco Use: Low Risk  (02/27/2023)     Readmission Risk Interventions    02/22/2023    2:58 PM  Readmission Risk Prevention Plan  Transportation Screening Complete  HRI or Home Care Consult Complete  Social Work Consult for Recovery Care Planning/Counseling Complete  Palliative Care Screening Not Applicable  Medication Review Oceanographer) Referral to Pharmacy

## 2023-03-13 NOTE — Discharge Summary (Addendum)
Physician Discharge Summary  Nathan [name] NGE:[id number] DOB: [birth year] DOA: 02/20/2023  PCP: Garlan Fillers, MD  Admit date: 02/20/2023 Discharge date: 03/13/2023  Time spent:45 minutes  Recommendations for Outpatient Follow-up:  Cardiology Dr. Anne Fu in 2 to 3 weeks [place] interventional radiology in 4-6 weeks for gallbladder drain follow-up Consider rechallenge with anticoagulation in 1 to 2 months Outpatient palliative care   Discharge Diagnoses:  Principal Problem: NSTEMI Acute systolic CHF Acute hypoxic respiratory failure Left thigh hematoma Acute blood loss anemia Paroxysmal atrial fibrillation Type 2 diabetes mellitus   Acute metabolic encephalopathy   Acute cholecystitis   Acute posthemorrhagic anemia   Acute kidney injury on CKD stage 3a, GFR 45-59 ml/min (HCC)   Dyslipidemia   Paroxysmal atrial fibrillation (HCC)   Protein-calorie malnutrition, severe   Discharge Condition: improved  Diet recommendation:  Diabetic, low sodium  Filed Weights   03/11/23 0509 03/12/23 0701 03/13/23 0236  Weight: 63.4 kg 65 kg 62.8 kg    History of present illness:  77 y.o. M with DM, HTN, CAD s/p CABG and AVR, hx OSA, and history severe necrotizing fasciitis few years ago who presented with chest discomfort. High sensitive troponin 12, 31, 4,527, 6,445   -placed on nitroglycerin drip for pain control and heparin drip for anticoagulation.  8/14 echo: EF 20-25% 8/20 echo: EF 25-30% with RWMA,   8/13: Admitted on heparin to Cardiology for NSTEMI --> evening of HD1, developed respiratory distress and rales requiring BiPAP and ICU transfer 8/14: Developed delirium requiring Precedex; Transaminitis noted 8/15: Cr peaks at 2.15 8/16: Weaned off Precedex; new onset Atrial fibrillation noted 8/17: Leukocytosis noted 8/18: Antibiotics started for Pneumonia based on CXR 8/20: Hgb down to 8s 8/21: ID consulted; CT C/A/P shows abnormal gallbladder, new hematoma; heparin  stopped 8/22: IR and Gen Surg consulted; HIDA confirms cholecystitis; transfused 2 units; Perc chole drain placed 8/23: Worsening agitation/delirium 8/24 further droop in Hgb down to 6,9, hypotension. Stopped heparin drip and ordered PRBC transfusion x1.  8/25 mentation has improved but not back to baseline, continues to use intermittently soft wrist restrains.  8/26 encephalopathy slowly improving, off restrains and improved po intake.  8/27 patient pulled his biliary drain and will need to be replaced per IR. Antibiotic therapy changed to oral Augmentin. Encephalopathy is improving, continue off restrains.  8/28: Drain exchanged in [facility] Course:   NSTEMI (non-ST elevated myocardial infarction) [facility]) Coronary artery disease sp CABG 2010 Echocardiogram with new reduction on LV systolic function, to 20 to 25%, global hypokinesis.  -Followed by cardiology, no plans for invasive workup in the setting of delirium renal failure frailty etc. -conservative management -Continue aspirin, statin, and metoprolol   Acute on chronic systolic CHF  Echo w/ EF 20 to 25%, with global hypokinesis, mild dilatated LV cavity, mild LVH, RV systolic function with moderate reduction -GDMT was limited by hypotension and AKI -Repeat echo 03/06/2023 noted improvement in EF to 35-40% -Volume status appears stable, blood pressure improved, -creatinine improving, resumed Jardiance and Aldactone -Follow-up with cardiology Dr. Anne Fu   Acute hypoxemic respiratory failure due to acute cardiogenic pulmonary edema. Required non invasive mechanical ventilation on admission.  -resolved   Acute metabolic encephalopathy -Delirium in the setting of cholecystitis, pain and prolonged hospitalization -Quetiapine QHS started for agitation, this can be slowly weaned off down the road -Mental status improving -Telemetry sitter discontinued, discharge planning -SNF for short-term rehab   Acute cholecystitis -Imaging  8/21 showed signs of gallstones and cholecystitis, confirmed  with HIDA. -Perc tube placed 8/22 -Plan for 6 weeks drain, follow up in IR clinic -8/27 transition to oral Augmentin, will need to complete 3 weeks total, and final duration pending repeat imaging.  -8/27 drain pulled by patient, drain exchanged 8/28, FU with Radiology in 6 weeks and Gen Surgery in 1-72months   Acute posthemorrhagic anemia 8/21 CT with incompletely imaged heterogeneous mixed hyper and hypodensity in the region of the right adductor musculature measuring 9,0 x 8,6 cm, hematoma, Hb dropped to 6.9 -sp 3 units PRBC last 8/24 -hb stable, continue to trend -Anticoagulation discontinued, reconsider as outpatient in 1 to 2 months   Acute kidney injury on CKD stage 3a, GFR 45-59 ml/min (HCC) AKI, hypokalemia.  -Improving   Dyslipidemia Continue with statin therapy.    Paroxysmal atrial fibrillation (HCC) New onset.  CHA2DS2-Vasc 6 (age, HTN, CHF, DM, vascular disease).  Continue rate control with metoprolol, holding anticoagulation due to acute bleed (left thigh hematoma)    S/P AVR   Type 2 diabetes mellitus -CBGs improving, continue Jardiance  Discharge Exam: Vitals:   03/13/23 0236 03/13/23 0703  BP: 97/65 124/86  Pulse: 68   Resp: 20   Temp: 97.6 F (36.4 C)   SpO2: 93%    Gen: Frail elderly male sitting up in bed, AAO x 2,  cognitive deficits improving  HEENT: No JVD CVS: S1-S2, regular rhythm Lungs: Clear anteriorly Abdomen: Soft, nontender, abdominal binder on, gallbladder drain noted, bowel sounds present  Extremities: No edema Skin: no new rashes on exposed skin     Discharge Instructions   Discharge Instructions     Change dressing (specify)   Complete by: As directed    Dressing change:  as needed to keep clean and dry   Diet - low sodium heart healthy   Complete by: As directed    Diet - low sodium heart healthy   Complete by: As directed    Diet Carb Modified   Complete by: As  directed    Discharge instructions   Complete by: As directed    Discharge planning: Percutaneous cholecystostomy drain to remain in place at least 6 weeks. Recommend fluoroscopy with injection of the drain in IR to evaluate for patency of the cystic duct in 8-12 weeks. Schedulers will call to arrange.   Patient should flush drain once daily with sterile saline Keep log of daily output and bring with you to all appointments.   Change dressing as needed to keep site clean and dry.   Discharge wound care:   Complete by: As directed    Routine wound care and drain care   Increase activity slowly   Complete by: As directed       Allergies as of 03/13/2023       Reactions   Shellfish-derived Products Anaphylaxis   Zolpidem Tartrate Other (See Comments)   Hallucinations        Medication List     STOP taking these medications    ALPRAZolam 0.25 MG tablet Commonly known as: XANAX   amLODipine 5 MG tablet Commonly known as: NORVASC   aspirin 81 MG tablet   hydrochlorothiazide 12.5 MG capsule Commonly known as: MICROZIDE   HYDROcodone-acetaminophen 5-500 MG tablet Commonly known as: VICODIN   isosorbide mononitrate 30 MG 24 hr tablet Commonly known as: IMDUR   meloxicam 15 MG tablet Commonly known as: MOBIC   metoprolol tartrate 50 MG tablet Commonly known as: LOPRESSOR   oxyCODONE-acetaminophen 5-325 MG tablet Commonly known as: PERCOCET/ROXICET  TAKE these medications    acetaminophen 500 MG tablet Commonly known as: TYLENOL Take 500 mg by mouth daily as needed for moderate pain.   allopurinol 300 MG tablet Commonly known as: ZYLOPRIM Take 300 mg by mouth daily.   amoxicillin-clavulanate 875-125 MG tablet Commonly known as: AUGMENTIN Take 1 tablet by mouth every 12 (twelve) hours for 10 days. What changed: when to take this   ascorbic acid 500 MG tablet Commonly known as: VITAMIN C Take by mouth daily.   atorvastatin 80 MG tablet Commonly  known as: LIPITOR Take 1 tablet (80 mg total) by mouth daily. What changed:  medication strength how much to take   CoQ10 100 MG Caps Take 100 mg by mouth daily.   cycloSPORINE 0.05 % ophthalmic emulsion Commonly known as: RESTASIS Place 1 drop into both eyes 2 (two) times daily.   empagliflozin 10 MG Tabs tablet Commonly known as: JARDIANCE Take 1 tablet (10 mg total) by mouth daily. Start taking on: March 14, 2023   fish oil-omega-3 fatty acids 1000 MG capsule Take by mouth daily.   fluorouracil 5 % cream Commonly known as: EFUDEX Apply topically daily.   glipiZIDE 5 MG tablet Commonly known as: GLUCOTROL Take 5 mg by mouth every morning.   ketoconazole 2 % cream Commonly known as: NIZORAL 2 (two) times daily.   metFORMIN 1000 MG tablet Commonly known as: GLUCOPHAGE Take 1,000 mg by mouth in the morning and at bedtime.   metoprolol succinate 50 MG 24 hr tablet Commonly known as: TOPROL-XL Take 1 tablet (50 mg total) by mouth daily. Take with or immediately following a meal. Start taking on: March 14, 2023   minoxidil 10 MG tablet Commonly known as: LONITEN Take 10 mg by mouth daily.   multivitamin tablet Take 1 tablet by mouth daily.   nitroGLYCERIN 0.4 MG SL tablet Commonly known as: NITROSTAT Place 1 tablet under the tongue as needed.   QUEtiapine 50 MG tablet Commonly known as: SEROQUEL Take 1 tablet (50 mg total) by mouth every evening.   saccharomyces boulardii 250 MG capsule Commonly known as: FLORASTOR Take 1 capsule (250 mg total) by mouth 2 (two) times daily.   spironolactone 25 MG tablet Commonly known as: ALDACTONE Take 0.5 tablets (12.5 mg total) by mouth daily. Start taking on: March 14, 2023   trolamine salicylate 10 % cream Commonly known as: ASPERCREME Apply 1 application topically as needed for muscle pain.   VITAMIN A PO Take by mouth daily.   VITAMIN B-12 IJ Inject 1,000 mcg into the muscle every 30 (thirty) days.  Amt/dose is unknown   VITAMIN D-3 PO Take 1 capsule by mouth in the morning and at bedtime.               Durable Medical Equipment  (From admission, onward)           Start     Ordered   02/27/23 1401  For home use only DME Walker rolling  Once       Question Answer Comment  Walker: With 5 Inch Wheels   Patient needs a walker to treat with the following condition Weakness      02/27/23 1400              Discharge Care Instructions  (From admission, onward)           Start     Ordered   03/13/23 0000  Discharge wound care:       Comments:  Routine wound care and drain care   03/13/23 1037   03/03/23 0000  Change dressing (specify)       Comments: Dressing change:  as needed to keep clean and dry   03/03/23 1415           Allergies  Allergen Reactions   Shellfish-Derived Products Anaphylaxis   Zolpidem Tartrate Other (See Comments)    Hallucinations    Contact information for follow-up providers     Garlan Fillers, MD Follow up.   Specialty: Internal Medicine Contact information: [address] [place] [address] [phone]         [facility] Follow up.   Specialty: Home Health Services Why: Home health services will be provide by [facility] , start of care within 48 hiurs post discharge Contact information: [address] [place] [address] [phone]         [facility] IR Imaging Follow up.   Specialty: Radiology Why: Schedulers will call with date and time of follow-up appointment Contact information: [address] [place] [address] [phone]             Contact information for after-discharge care     Destination     [facility] Preferred SNF .   Service: Skilled Nursing Contact information: [address] [place] [address] [phone]                      The results of  significant diagnostics from this hospitalization (including imaging, microbiology, ancillary and laboratory) are listed below for reference.    Significant Diagnostic Studies: IR EXCHANGE BILIARY DRAIN  Result Date: 03/08/2023 INDICATION: Malfunctioning drainage tube. History of acute cholecystitis, post ultrasound fluoroscopic guided cholecystostomy tube placement on 03/02/2023. EXAM: FLUOROSCOPIC GUIDED CHOLECYSTOSTOMY TUBE EXCHANGE COMPARISON:  IR fluoroscopy, 03/02/2023.  CT CAP, 03/01/2023. MEDICATIONS: None ANESTHESIA/SEDATION: Local anesthetic was administered. CONTRAST:  10mL OMNIPAQUE IOHEXOL 300 MG/ML SOLN - administered into the gallbladder lumen. FLUOROSCOPY TIME:  Fluoroscopic dose; 16 mGy COMPLICATIONS: None immediate. PROCEDURE: The patient was positioned supine on the fluoroscopy table. The external portion of the existing cholecystostomy tube as well as the surrounding skin was prepped and draped in usual sterile fashion. A time-out was performed prior to the initiation of the procedure. A preprocedural spot fluoroscopic image was obtained of the right upper abdominal quadrant existing cholecystostomy tube. The skin surrounding the cholecystostomy tube was anesthetized with 1% lidocaine. The external portion of the cholecystostomy tube was cut and cannulated with a short Amplatz wire which was advanced through the tube and coiled within the gallbladder lumen Next, under intermittent fluoroscopic guidance, the existing 10 [place] cholecystostomy tube was exchanged for a new, 10 Fr cholecystostomy tube which was repositioned into the more central aspect of the gallbladder lumen. Contrast injection confirms appropriate positioning and functionality of the cholecystomy tube. The cholecystostomy tube was flushed with a small amount of saline and reconnected to a gravity bag. The cholecystostomy tube was secured with an interrupted suture and a Stat Lock device. A dressing was applied. The patient  tolerated the procedure well without immediate postprocedural complication. FINDINGS: *Preprocedural spot fluoroscopic image demonstrates unchanged positioning of cholecystostomy tube with end coiled and locked over the expected location of the fundus of the gallbladder *Antegrade cholangiogram demonstrating a patent cystic duct and no extrahepatic biliary ductal dilatation. Question small filling defect at the ampulla, suspicious for choledocholithiasis. See key image. *After fluoroscopic guided exchange,  the new, 10 Fr cholecystostomy tube is more ideally positioned with end coiled and locked within the central aspect of the gallbladder lumen. *Post exchange cholangiogram demonstrates appropriate positioning and functionality of the new cholecystostomy tube. IMPRESSION: Successful fluoroscopic guided exchange, and repositioning for a new 10 Fr cholecystostomy tube. Question small filling defect at the ampulla, suspicious for choledocholithiasis. No extrahepatic biliary ductal dilatation. PLAN: The patient will return to Vascular Interventional Radiology (VIR) for routine drainage catheter evaluation and exchange in 6-8 weeks. Roanna Banning, MD [facility] Electronically Signed   By: Roanna Banning M.D.   On: 03/08/2023 09:25   ECHOCARDIOGRAM LIMITED  Result Date: 03/06/2023    ECHOCARDIOGRAM LIMITED REPORT   Patient Name:   EFRAYIM FILAK Date of Exam: 03/06/2023 Medical Rec #:  [id number]      Height:       67.0 in Accession #:    [id number]     Weight:       148.4 lb Date of Birth:  1946/02/02     BSA:          1.781 m Patient Age:    76 years       BP:           99/53 mmHg Patient Gender: M              HR:           73 bpm. Exam Location:  Inpatient Procedure: Limited Echo, Limited Color Doppler and Intracardiac Opacification            Agent Indications:    Acute MI  History:        Patient has prior history of Echocardiogram examinations, most                  recent 02/28/2023. Acute MI, Prior CABG, Aortic Valve Disease,                 Arrythmias:Bradycardia, Signs/Symptoms:Murmur; Risk                 Factors:Hypertension, Sleep Apnea and Diabetes.                 Aortic Valve: 23 mm Edwards bioprosthetic valve is present in                 the aortic position.  Sonographer:    Milda Smart Referring Phys: [id number] Orbie Pyo  Sonographer Comments: Suboptimal parasternal window. Image acquisition challenging due to patient body habitus and Image acquisition challenging due to respiratory motion. IMPRESSIONS  1. Left ventricular ejection fraction, by estimation, is 35 to 40%. The left ventricle has moderately decreased function. The left ventricle demonstrates global hypokinesis. Left ventricular diastolic function could not be evaluated.  2. Right ventricular systolic function is normal. The right ventricular size is normal.  3. The mitral valve is normal in structure. Mild mitral valve regurgitation. No evidence of mitral stenosis. Moderate mitral annular calcification.  4. The aortic valve has been repaired/replaced. Aortic valve regurgitation is not visualized. There is a 23 mm Edwards bioprosthetic valve present in the aortic position.  5. The inferior vena cava is normal in size with greater than 50% respiratory variability, suggesting right atrial pressure of 3 mmHg. FINDINGS  Left Ventricle: Left ventricular ejection fraction, by estimation, is 35 to 40%. The left ventricle has moderately decreased function. The left ventricle demonstrates global hypokinesis. Definity contrast agent was given IV to delineate the left  ventricular endocardial borders. The left ventricular internal cavity size was normal in size. There is no left ventricular hypertrophy. Left ventricular diastolic function could not be evaluated. Right Ventricle: The right ventricular size is normal. No increase in right ventricular wall thickness. Right ventricular systolic function  is normal. Left Atrium: Left atrial size was normal in size. Right Atrium: Right atrial size was normal in size. Pericardium: There is no evidence of pericardial effusion. Mitral Valve: The mitral valve is normal in structure. Moderate mitral annular calcification. Mild mitral valve regurgitation. No evidence of mitral valve stenosis. Tricuspid Valve: The tricuspid valve is normal in structure. Tricuspid valve regurgitation is mild . No evidence of tricuspid stenosis. Aortic Valve: The aortic valve has been repaired/replaced. Aortic valve regurgitation is not visualized. There is a 23 mm Edwards bioprosthetic valve present in the aortic position. Pulmonic Valve: The pulmonic valve was normal in structure. Pulmonic valve regurgitation is trivial. No evidence of pulmonic stenosis. Aorta: The aortic root is normal in size and structure. Venous: The inferior vena cava is normal in size with greater than 50% respiratory variability, suggesting right atrial pressure of 3 mmHg. IAS/Shunts: No atrial level shunt detected by color flow Doppler. Additional Comments: Color Doppler performed.  LEFT VENTRICLE PLAX 2D LVIDd:         4.30 cm LVIDs:         3.70 cm  Armanda Magic MD Electronically signed by Armanda Magic MD Signature Date/Time: 03/06/2023/12:51:11 PM    Final    CT CHEST ABDOMEN PELVIS WO CONTRAST  Addendum Date: 03/02/2023   ADDENDUM REPORT: 03/02/2023 17:46 ADDENDUM: These results were called by telephone at the time of interpretation on 03/01/2023 at 5:55 pm to provider Dr. Antionette Char, who verbally acknowledged these results. Electronically Signed   By: Darliss Cheney M.D.   On: 03/02/2023 17:46   Result Date: 03/02/2023 CLINICAL DATA:  Chest pain, infection. EXAM: CT CHEST, ABDOMEN AND PELVIS WITHOUT CONTRAST TECHNIQUE: Multidetector CT imaging of the chest, abdomen and pelvis was performed following the standard protocol without IV contrast. RADIATION DOSE REDUCTION: This exam was performed according to the  departmental dose-optimization program which includes automated exposure control, adjustment of the mA and/or kV according to patient size and/or use of iterative reconstruction technique. COMPARISON:  CT chest abdomen and pelvis 01/21/2023 FINDINGS: CT CHEST FINDINGS Cardiovascular: Heart is mildly enlarged. Aorta is normal in size. Patient is status post cardiac surgery. There are atherosclerotic calcifications of the aorta and coronary arteries. There is no pericardial effusion. Mediastinum/Nodes: No enlarged mediastinal, hilar, or axillary lymph nodes. Thyroid gland, trachea, and esophagus demonstrate no significant findings. Lungs/Pleura: There are diffuse peripheral scattered reticular opacities throughout both lungs, slightly increased from prior. There is no lung consolidation, pleural effusion or pneumothorax. Musculoskeletal: There are chronic left third, fourth, fifth, sixth rib fractures. There is subacute/acute nondisplaced posterolateral left eighth rib fracture. Sternotomy wires are present. Degenerative changes affect the spine. CT ABDOMEN PELVIS FINDINGS Hepatobiliary: Gallbladder is dilated. There is diffuse gallbladder wall thickening and surrounding inflammatory stranding. Gallstones are present. There is no biliary ductal dilatation. Liver is within normal limits. Pancreas: Unremarkable. No pancreatic ductal dilatation or surrounding inflammatory changes. Spleen: There are calcified granulomas in the spleen. The spleen is otherwise within normal limits. Adrenals/Urinary Tract: There is a 4.4 cm left renal cyst. There is no hydronephrosis. Vascular calcifications are noted in both kidneys. The bladder and adrenal glands are within normal limits. Stomach/Bowel: Stomach is within normal limits. Appendix appears normal. No evidence  of bowel wall thickening, distention, or inflammatory changes. There is sigmoid and descending colon diverticulosis. Vascular/Lymphatic: Aortic atherosclerosis. No  enlarged abdominal or pelvic lymph nodes. Reproductive: Prostate is unremarkable. Other: There is a small fat containing right inguinal hernia. There is mild presacral edema. There is no ascites. Musculoskeletal: No acute fracture or dislocation. Degenerative changes affect the spine. There is incompletely imaged heterogeneous mixed hyper and hypodensity in the region of the adductor musculature measuring 9.0 x 8.6 cm image 3/125. IMPRESSION: 1. Findings compatible with acute cholecystitis. 2. Subacute/acute nondisplaced left eighth rib fracture. 3. Diffuse peripheral reticular opacities throughout both lungs, slightly increased from prior, worrisome for chronic interstitial lung disease. 4. Incompletely imaged heterogeneous mixed hyper and hypodensity in the region of the right adductor musculature measuring 9.0 x 8.6 cm. Findings may represent hematoma, but other etiologies are not excluded. Correlate clinically for infection. 5. Colonic diverticulosis. Aortic Atherosclerosis (ICD10-I70.0). Electronically Signed: By: Darliss Cheney M.D. On: 03/01/2023 19:25   IR Perc Cholecystostomy  Result Date: 03/02/2023 INDICATION: 77 year old male with calculus cholecystitis referred for percutaneous cholecystostomy EXAM: CHOLECYSTOSTOMY MEDICATIONS: None ANESTHESIA/SEDATION: Moderate (conscious) sedation was not employed during this procedure. A total of Versed 1.0 mg and Fentanyl 0 mcg was administered intravenously. Moderate Sedation Time: 0 minutes. The patient's level of consciousness and vital signs were monitored continuously by radiology nursing throughout the procedure under my direct supervision. FLUOROSCOPY TIME:  Fluoroscopy Time:  (3 mGy). COMPLICATIONS: None PROCEDURE: Informed written consent was obtained from the patient and the patient's family after a thorough discussion of the procedural risks, benefits and alternatives. All questions were addressed. Maximal Sterile Barrier Technique was utilized  including caps, mask, sterile gowns, sterile gloves, sterile drape, hand hygiene and skin antiseptic. A timeout was performed prior to the initiation of the procedure. Ultrasound survey of the right upper quadrant was performed for planning purposes. Once the patient is prepped and draped in the usual sterile fashion, the skin and subcutaneous tissues overlying the gallbladder were generously infiltrated 1% lidocaine for local anesthesia. A coaxial needle was advanced under ultrasound guidance through the skin subcutaneous tissues and a small segment of liver into the gallbladder lumen. With removal of the stylet, spontaneous dark bile drainage occurred. Using modified Seldinger technique, a 10 French drain was placed into the gallbladder fossa, with aspiration of the sample for the lab. Contrast injection confirmed position of the tube within the gallbladder lumen. Drainage catheter was attached to gravity drain with a suture retention placed. Patient tolerated the procedure well and remained hemodynamically stable throughout. No complications were encountered and no significant blood loss encountered. IMPRESSION: Status post image guided percutaneous cholecystostomy. Signed, Yvone Neu. Miachel Roux, RPVI [facility] Electronically Signed   By: Gilmer Mor D.O.   On: 03/02/2023 17:21   NM Hepatobiliary Liver Func  Result Date: 03/02/2023 CLINICAL DATA:  Concern for cholecystitis. RIGHT upper quadrant pain EXAM: NUCLEAR MEDICINE HEPATOBILIARY IMAGING TECHNIQUE: Sequential images of the abdomen were obtained out to 60 minutes following intravenous administration of radiopharmaceutical. RADIOPHARMACEUTICALS:  5.2 mCi Tc-69m  Choletec IV COMPARISON:  CT 03/01/2023 FINDINGS: Prompt clearance radiotracer from blood pool and homogeneous uptake in liver. Counts are evident in the small bowel by 15 minutes. Gallbladder fails to fill at 60 minutes. 2.8 mg IV  morphine was administered to augment filling of the gallbladder. Gallbladder fails to fill following morphine augmentation. IMPRESSION: 1. Non filling of the gallbladder consistent with cystic duct obstruction/ACUTE CHOLECYSTITIS. 2. Patent common  bile duct. These results will be called to the ordering clinician or representative by the Radiologist Assistant, and communication documented in the PACS or [employer]. Electronically Signed   By: Genevive Bi M.D.   On: 03/02/2023 15:56   US Abdomen Limited RUQ (LIVER/GB)  Result Date: 03/02/2023 CLINICAL DATA:  Findings on CT concerning for acute cholecystitis EXAM: ULTRASOUND ABDOMEN LIMITED RIGHT UPPER QUADRANT COMPARISON:  CT abdomen and pelvis 03/01/2023 FINDINGS: Gallbladder: Mild wall thickening and pericholecystic fluid. Sludge and stones in the gallbladder including in the gallbladder neck measuring up to 1.3 cm. Common bile duct: Diameter: 5 mm.  No intrahepatic biliary dilation. Liver: No focal lesion identified. Within normal limits in parenchymal echogenicity. Portal vein is patent on color Doppler imaging with normal direction of blood flow towards the liver. Other: None. IMPRESSION: Findings compatible with acute cholecystitis. Electronically Signed   By: Minerva Fester M.D.   On: 03/02/2023 02:46   ECHOCARDIOGRAM LIMITED  Result Date: 03/01/2023    ECHOCARDIOGRAM LIMITED REPORT   Patient Name:   PATRICKJAMES HACKER Date of Exam: 02/28/2023 Medical Rec #:  [id number]      Height:       67.0 in Accession #:    [id number]     Weight:       151.5 lb Date of Birth:  Jan 27, 1946     BSA:          1.797 m Patient Age:    76 years       BP:           113/57 mmHg Patient Gender: M              HR:           77 bpm. Exam Location:  Inpatient Procedure: Limited Echo, Cardiac Doppler, Color Doppler and Intracardiac            Opacification Agent Indications:    I51.9 Decreased LV function. Rule out LV thrombus.  History:        Patient has prior history  of Echocardiogram examinations, most                 recent 02/24/2023. CAD, Prior CABG, Aortic Valve Disease,                 Signs/Symptoms:Chest Pain; Risk Factors:Hypertension, Sleep                 Apnea, Dyslipidemia and Diabetes. AVR.                 Aortic Valve: 23 mm Edwards bioprosthetic valve is present in                 the aortic position.  Sonographer:    Sheralyn Boatman RDCS Referring Phys: [phone] MICHAEL COOPER  Sonographer Comments: Technically difficult study due to poor echo windows. IMPRESSIONS  1. Left ventricular ejection fraction, by estimation, is 25 to 30%. The left ventricle has severely decreased function. The left ventricle demonstrates regional wall motion abnormalities (see scoring diagram/findings for description). There is mild concentric left ventricular hypertrophy. There is global hypokinesis but more severe hypokinesis of the left ventricular, inferior wall, inferoseptal wall and apical segment.  2. The mitral valve is degenerative. Trivial mitral valve regurgitation. Moderate mitral annular calcification.  3. The aortic valve has been repaired/replaced. There is a 23 mm Edwards bioprosthetic valve present in the aortic position.  4. No evidence of apical LV thrombus by definity contrast. FINDINGS  Left  Ventricle: Left ventricular ejection fraction, by estimation, is 25 to 30%. The left ventricle has severely decreased function. The left ventricle demonstrates regional wall motion abnormalities. Definity contrast agent was given IV to delineate the left ventricular endocardial borders. There is mild concentric left ventricular hypertrophy. Mitral Valve: The mitral valve is degenerative in appearance. There is moderate thickening of the mitral valve leaflet(s). There is moderate calcification of the mitral valve leaflet(s). Moderately decreased mobility of the mitral valve leaflets. Moderate mitral annular calcification. Trivial mitral valve regurgitation. Aortic Valve: The aortic valve  has been repaired/replaced. There is a 23 mm Edwards bioprosthetic valve present in the aortic position. Additional Comments: Spectral Doppler performed. Color Doppler performed.  LEFT VENTRICLE PLAX 2D LVIDd:         5.10 cm LVIDs:         4.40 cm LV PW:         1.30 cm LV IVS:        1.20 cm  LV Volumes (MOD) LV vol d, MOD A2C: 143.0 ml LV vol d, MOD A4C: 168.5 ml LV vol s, MOD A2C: 103.0 ml LV vol s, MOD A4C: 116.5 ml LV SV MOD A2C:     40.0 ml LV SV MOD A4C:     168.5 ml LV SV MOD BP:      48.7 ml IVC IVC diam: 2.60 cm Armanda Magic MD Electronically signed by Armanda Magic MD Signature Date/Time: 03/01/2023/8:15:09 AM    Final    CT HEAD WO CONTRAST ( )  Result Date: 03/01/2023 CLINICAL DATA:  Mental status change, unknown cause EXAM: CT HEAD WITHOUT CONTRAST TECHNIQUE: Contiguous axial images were obtained from the base of the skull through the vertex without intravenous contrast. RADIATION DOSE REDUCTION: This exam was performed according to the departmental dose-optimization program which includes automated exposure control, adjustment of the mA and/or kV according to patient size and/or use of iterative reconstruction technique. COMPARISON:  None Available. FINDINGS: Brain: No evidence of acute infarction, hemorrhage, mass, mass effect, or midline shift. No hydrocephalus or extra-axial fluid collection. Vascular: No hyperdense vessel. Atherosclerotic calcifications in the intracranial carotid and vertebral arteries. Skull: Negative for fracture or focal lesion. Sinuses/Orbits: Left maxillary mucous retention cyst. Status post bilateral lens replacements. Other: The mastoid air cells are well aerated. IMPRESSION: No acute intracranial process. Electronically Signed   By: Wiliam Ke M.D.   On: 03/01/2023 03:32   DG Chest 2 View  Result Date: 02/28/2023 CLINICAL DATA:  Pneumonia. EXAM: CHEST - 2 VIEW COMPARISON:  February 25, 2023. FINDINGS: Stable cardiomegaly. Status post coronary artery bypass graft  and aortic valve repair. Lungs are clear. Old left rib fractures are noted. IMPRESSION: No active cardiopulmonary disease. Electronically Signed   By: Lupita Raider M.D.   On: 02/28/2023 13:45   DG CHEST PORT 1 VIEW  Result Date: 02/25/2023 CLINICAL DATA:  Chest pain, cough EXAM: PORTABLE CHEST 1 VIEW COMPARISON:  02/21/2023 FINDINGS: Mild bilateral interstitial thickening with more focal left lower lobe airspace disease. No pleural effusion or pneumothorax. Stable cardiomediastinal silhouette. Prior CABG. No acute osseous abnormality. IMPRESSION: 1. Mild bilateral interstitial thickening with more focal left lower lobe airspace disease concerning for mild pulmonary edema versus pneumonia. Electronically Signed   By: Elige Ko M.D.   On: 02/25/2023 13:14   ECHOCARDIOGRAM LIMITED  Result Date: 02/24/2023    ECHOCARDIOGRAM LIMITED REPORT   Patient Name:   Naziah Urda Date of Exam: 02/24/2023 Medical Rec #:  [id number]  Height:       67.0 in Accession #:    [id number]     Weight:       151.5 lb Date of Birth:  Dec 06, 1945     BSA:          1.797 m Patient Age:    76 years       BP:           109/82 mmHg Patient Gender: M              HR:           79 bpm. Exam Location:  Inpatient Procedure: Color Doppler, Cardiac Doppler and Limited Echo Indications:    Cardiomyopathy  History:        Patient has prior history of Echocardiogram examinations, most                 recent 02/22/2023. CAD, Prior CABG and Prior Cardiac Surgery,                 Aortic Valve Disease and 23mm Edwards bioprosthetic aortic valve                 in 2010, Signs/Symptoms:Shortness of Breath and Chest Pain; Risk                 Factors:Diabetes.  Sonographer:    Milbert Coulter Referring Phys: Nicki [place], A IMPRESSIONS  1. Diffuse hypokinesis worse in the inferior, inferoseptal and apical walls; distal inferior akinesis. Cannot completely exlude mural thrombus at apex, recomm limited echo wit Definity to confirm . Left ventricular  ejection fraction, by estimation, is 25 to 30%. The left ventricle has severely decreased function. Left ventricular diastolic parameters are indeterminate.  2. Right ventricular systolic function is normal. The right ventricular size is normal.  3. Left atrial size was mildly dilated.  4. Mild mitral valve regurgitation. Moderate to severe mitral annular calcification.  5. S/p AVR (23 mm Edwards Bioprosthesis; procedure date 2010) Peak and mean gradients through the valve are 14 and 7 mm respectively. . The aortic valve has been repaired/replaced. Aortic valve regurgitation is not visualized.  6. The inferior vena cava is dilated in size with <50% respiratory variability, suggesting right atrial pressure of 15 mmHg. FINDINGS  Left Ventricle: Diffuse hypokinesis worse in the inferior, inferoseptal and apical walls; distal inferior akinesis. Cannot completely exlude mural thrombus at apex, recomm limited echo wit Definity to confirm. Left ventricular ejection fraction, by estimation, is 25 to 30%. The left ventricle has severely decreased function. The left ventricular internal cavity size was normal in size. Left ventricular diastolic parameters are indeterminate. Right Ventricle: The right ventricular size is normal. Right ventricular systolic function is normal. Left Atrium: Left atrial size was mildly dilated. Right Atrium: Right atrial size was normal in size. Pericardium: There is no evidence of pericardial effusion. Mitral Valve: There is mild thickening of the mitral valve leaflet(s). Moderate to severe mitral annular calcification. Mild mitral valve regurgitation. Tricuspid Valve: The tricuspid valve is normal in structure. Tricuspid valve regurgitation is mild. Aortic Valve: S/p AVR (23 mm Edwards Bioprosthesis; procedure date 2010) Peak and mean gradients through the valve are 14 and 7 mm respectively. The aortic valve has been repaired/replaced. Aortic valve regurgitation is not visualized. Aortic valve  mean gradient measures 7.5 mmHg. Aortic valve peak gradient measures 14.8 mmHg. Aortic valve area, by VTI measures 0.88 cm. Aorta: Aortic root could not be assessed. Venous: The inferior vena cava is dilated  in size with less than 50% respiratory variability, suggesting right atrial pressure of 15 mmHg. IAS/Shunts: No atrial level shunt detected by color flow Doppler. Additional Comments: Spectral Doppler performed. Color Doppler performed.  LEFT VENTRICLE PLAX 2D LVOT diam:     1.60 cm   Diastology LV SV:         27        LV e' medial:  3.73 cm/s LV SV Index:   15        LV e' lateral: 7.22 cm/s LVOT Area:     2.01 cm  RIGHT VENTRICLE RV S prime:     9.25 cm/s TAPSE (M-mode): 1.1 cm LEFT ATRIUM             Index        RIGHT ATRIUM           Index LA Vol (A2C):   71.5 ml 39.79 ml/m  RA Area:     16.10 cm LA Vol (A4C):   58.0 ml 32.28 ml/m  RA Volume:   37.00 ml  20.59 ml/m LA Biplane Vol: 66.1 ml 36.79 ml/m  AORTIC VALVE AV Area (Vmax):    0.83 cm AV Area (Vmean):   0.84 cm AV Area (VTI):     0.88 cm AV Vmax:           192.50 cm/s AV Vmean:          126.000 cm/s AV VTI:            0.302 m AV Peak Grad:      14.8 mmHg AV Mean Grad:      7.5 mmHg LVOT Vmax:         79.70 cm/s LVOT Vmean:        52.400 cm/s LVOT VTI:          0.132 m LVOT/AV VTI ratio: 0.44  SHUNTS Systemic VTI:  0.13 m Systemic Diam: 1.60 cm Dietrich Pates MD Electronically signed by Dietrich Pates MD Signature Date/Time: 02/24/2023/5:47:57 PM    Final    ECHOCARDIOGRAM COMPLETE  Result Date: 02/22/2023    ECHOCARDIOGRAM REPORT   Patient Name:   TYKEEM ZAYA Date of Exam: 02/22/2023 Medical Rec #:  [id number]      Height:       67.0 in Accession #:    [id number]     Weight:       144.8 lb Date of Birth:  1945-10-29     BSA:          1.763 m Patient Age:    76 years       BP:           89/59 mmHg Patient Gender: M              HR:           127 bpm. Exam Location:  Inpatient Procedure: 2D Echo, Cardiac Doppler and Color Doppler Indications:     R07.9* Chest pain, unspecified  History:        Patient has prior history of Echocardiogram examinations, most                 recent 08/21/2020. CAD, Prior CABG, Aortic Valve Disease,                 Signs/Symptoms:Shortness of Breath, Chest Pain and Dyspnea; Risk                 Factors:Diabetes. AVR.  Aortic Valve: 23 mm Edwards bioprosthetic valve is present in                 the aortic position. Procedure Date: 2010.  Sonographer:    Sheralyn Boatman RDCS Referring Phys: [id number] [facility]  Sonographer Comments: Image acquisition challenging due to patient behavioral factors. and Image acquisition challenging due to uncooperative patient. Patient rolled away from probe, in 4 piont restraints. IMPRESSIONS  1. Only limited images obtained; pt refused to continue study after initial apical images obtained; doppler of aortic valve not complete.  2. Left ventricular ejection fraction, by estimation, is 20 to 25%. The left ventricle has severely decreased function. The left ventricle demonstrates global hypokinesis. The left ventricular internal cavity size was mildly dilated. There is mild left ventricular hypertrophy. Left ventricular diastolic function could not be evaluated.  3. Right ventricular systolic function is moderately reduced. The right ventricular size is normal.  4. Left atrial size was moderately dilated.  5. The mitral valve is degenerative. Mild mitral valve regurgitation. No evidence of mitral stenosis. Moderate mitral annular calcification.  6. The aortic valve has been repaired/replaced. Aortic valve regurgitation is not visualized. There is a 23 mm Edwards bioprosthetic valve present in the aortic position. Procedure Date: 2010.  7. Aortic dilatation noted. There is mild dilatation of the ascending aorta, measuring 40 mm. FINDINGS  Left Ventricle: Left ventricular ejection fraction, by estimation, is 20 to 25%. The left ventricle has severely decreased function. The left ventricle  demonstrates global hypokinesis. The left ventricular internal cavity size was mildly dilated. There is mild left ventricular hypertrophy. Left ventricular diastolic function could not be evaluated due to atrial fibrillation. Left ventricular diastolic function could not be evaluated. Right Ventricle: The right ventricular size is normal. Right ventricular systolic function is moderately reduced. Left Atrium: Left atrial size was moderately dilated. Right Atrium: Right atrial size was normal in size. Pericardium: There is no evidence of pericardial effusion. Mitral Valve: The mitral valve is degenerative in appearance. Moderate mitral annular calcification. Mild mitral valve regurgitation. No evidence of mitral valve stenosis. Tricuspid Valve: The tricuspid valve is normal in structure. Tricuspid valve regurgitation is mild . No evidence of tricuspid stenosis. Aortic Valve: The aortic valve has been repaired/replaced. Aortic valve regurgitation is not visualized. There is a 23 mm Edwards bioprosthetic valve present in the aortic position. Procedure Date: 2010. Pulmonic Valve: The pulmonic valve was normal in structure. Pulmonic valve regurgitation is trivial. No evidence of pulmonic stenosis. Aorta: Aortic dilatation noted. There is mild dilatation of the ascending aorta, measuring 40 mm. IAS/Shunts: The interatrial septum was not assessed. Additional Comments: Only limited images obtained; pt refused to continue study after initial apical images obtained; doppler of aortic valve not complete.  LEFT VENTRICLE PLAX 2D LVIDd:         5.50 cm LVIDs:         5.00 cm LV PW:         1.30 cm LV IVS:        1.10 cm LVOT diam:     2.10 cm LVOT Area:     3.46 cm  LEFT ATRIUM           Index LA diam:      4.60 cm 2.61 cm/m LA Vol (A4C): 54.3 ml 30.80 ml/m   AORTA Ao Root diam: 3.60 cm Ao Asc diam:  3.90 cm  SHUNTS Systemic Diam: 2.10 cm Olga Millers MD Electronically signed by Olga Millers MD Signature  Date/Time:  02/22/2023/8:44:24 AM    Final    DG CHEST PORT 1 VIEW  Result Date: 02/21/2023 CLINICAL DATA:  Shortness of breath. EXAM: PORTABLE CHEST 1 VIEW COMPARISON:  Radiograph yesterday, CT 01/21/2023 FINDINGS: Patient is rotated. Prior median sternotomy. Cardiac valve replacement. Stable cardiomegaly. Unchanged mediastinal contours. Increasing linear opacity in the right mid lung may represent fluid in the fissure. Suspect small pleural effusions. Progressive interstitial thickening which may represent pulmonary edema superimposed on chronic lung disease. No pneumothorax. IMPRESSION: 1. Progressive interstitial thickening which may represent pulmonary edema superimposed on chronic lung disease. 2. Suspect small pleural effusions. 3. Stable cardiomegaly. Electronically Signed   By: Narda Rutherford M.D.   On: 02/21/2023 17:33   DG Chest Port 1 View  Result Date: 02/20/2023 CLINICAL DATA:  Chest pain EXAM: PORTABLE CHEST 1 VIEW COMPARISON:  CT chest dated 01/21/2023 FINDINGS: Mild subpleural patchy opacities in the lungs bilaterally, favoring mild chronic interstitial lung disease when correlating with prior CT chest, although atypical infection is possible. No pleural effusion or pneumothorax. Cardiomegaly. Prosthetic valve. Thoracic aortic atherosclerosis. Postsurgical changes related to prior CABG. Median sternotomy. IMPRESSION: Mild subpleural patchy opacities, favoring mild chronic interstitial lung disease, although atypical infection is possible. Electronically Signed   By: Charline Bills M.D.   On: 02/20/2023 21:39    Microbiology: No results found for this or any previous visit (from the past 240 hour(s)).   Labs: Basic Metabolic Panel: Recent Labs  Lab 03/07/23 0538 03/08/23 1038 03/09/23 0448 03/10/23 0322 03/11/23 0511 03/12/23 0350 03/13/23 0252  NA 140   < > 139 141 142 138 136  K 3.9   < > 3.6 3.3* 4.5 3.8 3.8  CL 109   < > 107 106 109 105 107  CO2 19*   < > 21* 23 24 20* 23   GLUCOSE 213*   < > 200* 157* 192* 126* 147*  BUN 40*   < > 30* 33* 33* 32* 33*  CREATININE 1.77*   < > 1.36* 1.45* 1.79* 1.37* 1.50*  CALCIUM 8.6*   < > 8.7* 8.8* 8.8* 8.4* 8.5*  PHOS 2.5  --   --   --   --   --   --    < > = values in this interval not displayed.   Liver Function Tests: Recent Labs  Lab 03/07/23 0538  ALBUMIN 2.0*   No results for input(s): "LIPASE", "AMYLASE" in the last 168 hours. No results for input(s): "AMMONIA" in the last 168 hours. CBC: Recent Labs  Lab 03/08/23 1038 03/09/23 0448 03/10/23 0322 03/11/23 0511 03/13/23 0252  WBC 11.9* 12.9* 9.8 9.7 9.3  HGB 8.2* 8.8* 8.9* 10.0* 10.4*  HCT 25.6* 26.6* 27.7* 32.1* 32.1*  MCV 91.8 89.9 94.2 94.7 96.1  PLT 188 206 195 207 180   Cardiac Enzymes: No results for input(s): "CKTOTAL", "CKMB", "CKMBINDEX", "TROPONINI" in the last 168 hours. BNP: BNP (last 3 results) No results for input(s): "BNP" in the last 8760 hours.  ProBNP (last 3 results) No results for input(s): "PROBNP" in the last 8760 hours.  CBG: Recent Labs  Lab 03/11/23 2136 03/12/23 0742 03/12/23 1154 03/13/23 0803 03/13/23 1114  GLUCAP 149* 107* 182* 225* 171*       Signed:  Zannie Cove MD.  [facility] 03/13/2023, 1:21 PM

## 2023-03-13 NOTE — Care Management Important Message (Signed)
Important Message  Patient Details  Name: Kedric Setters MRN: [id number] Date of Birth: 01/11/1946   Medicare Important Message Given:  Yes     Renie Ora 03/13/2023, 10:50 AM

## 2023-03-14 DIAGNOSIS — N1831 Chronic kidney disease, stage 3a: Secondary | ICD-10-CM | POA: Diagnosis not present

## 2023-03-14 DIAGNOSIS — K810 Acute cholecystitis: Secondary | ICD-10-CM | POA: Diagnosis not present

## 2023-03-14 DIAGNOSIS — I4891 Unspecified atrial fibrillation: Secondary | ICD-10-CM | POA: Diagnosis not present

## 2023-03-14 DIAGNOSIS — J9600 Acute respiratory failure, unspecified whether with hypoxia or hypercapnia: Secondary | ICD-10-CM | POA: Diagnosis not present

## 2023-03-14 DIAGNOSIS — I214 Non-ST elevation (NSTEMI) myocardial infarction: Secondary | ICD-10-CM | POA: Diagnosis not present

## 2023-03-14 DIAGNOSIS — M109 Gout, unspecified: Secondary | ICD-10-CM | POA: Diagnosis not present

## 2023-03-14 DIAGNOSIS — M545 Low back pain, unspecified: Secondary | ICD-10-CM | POA: Diagnosis not present

## 2023-03-14 DIAGNOSIS — I25810 Atherosclerosis of coronary artery bypass graft(s) without angina pectoris: Secondary | ICD-10-CM | POA: Diagnosis not present

## 2023-03-14 DIAGNOSIS — E785 Hyperlipidemia, unspecified: Secondary | ICD-10-CM | POA: Diagnosis not present

## 2023-03-14 DIAGNOSIS — I5022 Chronic systolic (congestive) heart failure: Secondary | ICD-10-CM | POA: Diagnosis not present

## 2023-03-14 DIAGNOSIS — E1122 Type 2 diabetes mellitus with diabetic chronic kidney disease: Secondary | ICD-10-CM | POA: Diagnosis not present

## 2023-03-14 DIAGNOSIS — I129 Hypertensive chronic kidney disease with stage 1 through stage 4 chronic kidney disease, or unspecified chronic kidney disease: Secondary | ICD-10-CM | POA: Diagnosis not present

## 2023-03-15 ENCOUNTER — Other Ambulatory Visit: Payer: Self-pay | Admitting: General Surgery

## 2023-03-15 DIAGNOSIS — K810 Acute cholecystitis: Secondary | ICD-10-CM

## 2023-03-17 DIAGNOSIS — K819 Cholecystitis, unspecified: Secondary | ICD-10-CM | POA: Diagnosis not present

## 2023-03-17 DIAGNOSIS — G9340 Encephalopathy, unspecified: Secondary | ICD-10-CM | POA: Diagnosis not present

## 2023-03-17 DIAGNOSIS — N183 Chronic kidney disease, stage 3 unspecified: Secondary | ICD-10-CM | POA: Diagnosis not present

## 2023-03-17 DIAGNOSIS — I214 Non-ST elevation (NSTEMI) myocardial infarction: Secondary | ICD-10-CM | POA: Diagnosis not present

## 2023-03-20 ENCOUNTER — Encounter: Payer: Self-pay | Admitting: Internal Medicine

## 2023-03-20 ENCOUNTER — Ambulatory Visit (INDEPENDENT_AMBULATORY_CARE_PROVIDER_SITE_OTHER): Payer: Medicare Other | Admitting: Internal Medicine

## 2023-03-20 ENCOUNTER — Other Ambulatory Visit: Payer: Self-pay

## 2023-03-20 VITALS — BP 110/72 | HR 72 | Resp 16 | Ht 67.0 in | Wt 137.8 lb

## 2023-03-20 DIAGNOSIS — K819 Cholecystitis, unspecified: Secondary | ICD-10-CM | POA: Diagnosis not present

## 2023-03-20 DIAGNOSIS — B20 Human immunodeficiency virus [HIV] disease: Secondary | ICD-10-CM

## 2023-03-20 NOTE — Progress Notes (Signed)
Patient Active Problem List   Diagnosis Date Noted   Protein-calorie malnutrition, severe 03/09/2023   Groin hematoma 03/03/2023   Acute cholecystitis 03/02/2023   Paroxysmal atrial fibrillation (HCC) 03/02/2023   Acute posthemorrhagic anemia 03/02/2023   Hypokalemia 03/02/2023   Acute kidney injury on CKD stage 3a, GFR 45-59 ml/min (HCC) 03/01/2023   CKD stage 3a, GFR 45-59 ml/min (HCC) 03/01/2023   Acute metabolic encephalopathy 02/28/2023   Myocardial infarction due to demand ischemia (HCC) 02/21/2023   S/P AVR 02/21/2023   Acute on chronic systolic CHF (congestive heart failure) (HCC) 02/21/2023   Acute respiratory failure with hypoxia (HCC) 02/21/2023   Abdominal pain 01/21/2023   Tachycardia 01/21/2023   Elevated LFTs 01/21/2023   Balance problem 04/26/2022   Neuropathic pain 04/26/2022   Primary open-angle glaucoma, bilateral, mild stage 08/16/2021   Pseudophakia, both eyes 08/16/2021   Moderate nonproliferative diabetic retinopathy of both eyes without macular edema associated with type 2 diabetes mellitus (HCC) 08/16/2021   Posterior vitreous detachment of both eyes 08/16/2021   DM (diabetes mellitus), secondary, with neurologic complications (HCC) 06/13/2017   Obesity (BMI 30-39.9) 03/15/2016   Abnormal nuclear stress test 03/01/2016    umbilical hernia repair open with Ventralex mesh Oct 2014 04/10/2013   Other general symptoms  01/06/2011   Personal history of colonic polyps 01/06/2011   Iron deficiency anemia 01/06/2011   Gastritis, chronic 01/06/2011   Benign hypertensive heart disease without heart failure 10/13/2010   Hx of CABG 10/13/2010   Systolic hypertension    Sinus bradycardia    CAD (coronary artery disease)    History of prosthetic aortic valve    History of diabetic retinopathy    History of necrotizing fasciitis    Postoperative anemia    Nightmares    B12 DEFICIENCY 12/02/2009   ESOPHAGEAL REFLUX 11/19/2009   CHEST PAIN  11/19/2009   BLOOD IN STOOL, OCCULT 11/19/2009   ANEMIA-IRON DEFICIENCY 08/19/2008   S/P aortic valve replacement with bioprosthetic valve 08/19/2008   GI BLEED 08/19/2008   Type 2 diabetes mellitus with hyperlipidemia (HCC) 08/14/2008   Dyslipidemia 08/14/2008   ANXIETY 08/14/2008   OBSTRUCTIVE SLEEP APNEA 08/14/2008   GLAUCOMA 08/14/2008   Essential hypertension 08/14/2008   PHARYNGITIS 08/14/2008   GASTRIC ULCER 08/14/2008   GASTRITIS 08/14/2008   DIVERTICULOSIS, COLON 08/14/2008    Patient's Medications  New Prescriptions   No medications on file  Previous Medications   ACETAMINOPHEN (TYLENOL) 500 MG TABLET    Take 500 mg by mouth daily as needed for moderate pain.   ALLOPURINOL (ZYLOPRIM) 300 MG TABLET    Take 300 mg by mouth daily.   AMOXICILLIN-CLAVULANATE (AUGMENTIN) 875-125 MG TABLET    Take 1 tablet by mouth every 12 (twelve) hours for 10 days.   ASCORBIC ACID (VITAMIN C) 500 MG TABLET    Take by mouth daily.   ATORVASTATIN (LIPITOR) 80 MG TABLET    Take 1 tablet (80 mg total) by mouth daily.   CHOLECALCIFEROL (VITAMIN D-3 PO)    Take 1 capsule by mouth in the morning and at bedtime.   COENZYME Q10 (COQ10) 100 MG CAPS    Take 100 mg by mouth daily.   CYANOCOBALAMIN (VITAMIN B-12 IJ)    Inject 1,000 mcg into the muscle every 30 (thirty) days. Amt/dose is unknown   CYCLOSPORINE (RESTASIS) 0.05 % OPHTHALMIC EMULSION    Place 1 drop into both eyes 2 (two) times daily.   EMPAGLIFLOZIN (JARDIANCE)  10 MG TABS TABLET    Take 1 tablet (10 mg total) by mouth daily.   FISH OIL-OMEGA-3 FATTY ACIDS 1000 MG CAPSULE    Take by mouth daily.   FLUOROURACIL (EFUDEX) 5 % CREAM    Apply topically daily.   GLIPIZIDE (GLUCOTROL) 5 MG TABLET    Take 5 mg by mouth every morning.   KETOCONAZOLE (NIZORAL) 2 % CREAM    2 (two) times daily.   METFORMIN (GLUCOPHAGE) 1000 MG TABLET    Take 1,000 mg by mouth in the morning and at bedtime.   METOPROLOL SUCCINATE (TOPROL-XL) 50 MG 24 HR TABLET    Take 1  tablet (50 mg total) by mouth daily. Take with or immediately following a meal.   MINOXIDIL (LONITEN) 10 MG TABLET    Take 10 mg by mouth daily.    MULTIPLE VITAMIN (MULTIVITAMIN) TABLET    Take 1 tablet by mouth daily.   NITROGLYCERIN (NITROSTAT) 0.4 MG SL TABLET    Place 1 tablet under the tongue as needed.   QUETIAPINE (SEROQUEL) 50 MG TABLET    Take 1 tablet (50 mg total) by mouth every evening.   SACCHAROMYCES BOULARDII (FLORASTOR) 250 MG CAPSULE    Take 1 capsule (250 mg total) by mouth 2 (two) times daily.   SPIRONOLACTONE (ALDACTONE) 25 MG TABLET    Take 0.5 tablets (12.5 mg total) by mouth daily.   TROLAMINE SALICYLATE (ASPERCREME) 10 % CREAM    Apply 1 application topically as needed for muscle pain.   VITAMIN A PO    Take by mouth daily.  Modified Medications   No medications on file  Discontinued Medications   No medications on file    Subjective: 77 year old male with history of hallucinations, diabetes A1c 6.1 and other past medical history as below presents for hospital follow-up of cholecystitis.  He was initially admitted to [facility] for NSTEMI found to have new onset A-fib.  He had hallucinations and leukocytosis, ID engaged for possible infectious etiology.  CT abdomen pelvis showed signs of cholecystitis.  Mild elevation entrance and lysis.  General surgery did a HIDA scan which was positive.  Did not recommend cholecystectomy due to high residuals seizure.  IR engaged and patient underwent cholecystotomy tube placement with cultures growing E. coli and 8/22.  He was initially on pip-tazo and then transition to Augmentin on discharge.  On discharge plan was to leave the tube in for about 6 weeks, follow-up with interventional radiology Today: He reports he is tolerating antibiotics.  No new complaints.  Presents with family.  Review of Systems: Review of Systems  All other systems reviewed and are negative.   Past Medical History:  Diagnosis Date   Allergy    Aortic  stenosis    Arthritis    Balance problem 04/26/2022   Blood transfusion without reported diagnosis    CAD (coronary artery disease)    Cataract    both eyes surgically removed   Diabetes mellitus, type 2 (HCC)    Diverticulosis of colon (without mention of hemorrhage)    Duodenal ulcer perforation (HCC)    blood transfusion   Flesh-eating bacteria (HCC) 1995   GERD (gastroesophageal reflux disease)    Glaucoma    pre glaucoma on Xalatan drops   Heart murmur    History of diabetic retinopathy    History of gastrointestinal bleeding    History of necrotizing fasciitis    History of prosthetic aortic valve    Hypercholesteremia  Myocardial infarction (HCC) 02/23/2016   Neuropathic pain 04/26/2022   Nightmares    CHRONIC   OSA (obstructive sleep apnea)    borderline no CPAP    Personal history of colonic polyps 11/23/2009   TUBULAR ADENOMA   Postoperative anemia    Sinus bradycardia    Sleep apnea    no cpap   Stomach ulcer    Hx of   Systolic hypertension    Vitamin B12 deficiency     Social History   Tobacco Use   Smoking status: Never   Smokeless tobacco: Never  Vaping Use   Vaping status: Never Used  Substance Use Topics   Alcohol use: No    Alcohol/week: 0.0 standard drinks of alcohol   Drug use: No    Family History  Problem Relation Age of Onset   Cirrhosis Father    Alcohol abuse Father        father murdered   Rectal cancer Mother    Diabetes Mother    Colon cancer Mother 86       rectal cancer   Hypertension Mother    Hypertension Sister    Diabetes Sister    Heart attack Neg Hx    Stroke Neg Hx    Colon polyps Neg Hx    Esophageal cancer Neg Hx    Stomach cancer Neg Hx     Allergies  Allergen Reactions   Shellfish-Derived Products Anaphylaxis   Zolpidem Tartrate Other (See Comments)    Hallucinations    Health Maintenance  Topic Date Due   OPHTHALMOLOGY EXAM  Never done   Diabetic kidney evaluation - Urine ACR  Never done    DTaP/Tdap/Td (1 - Tdap) Never done   Zoster Vaccines- Shingrix (1 of 2) Never done   Pneumonia Vaccine 82+ Years old (1 of 1 - PCV) Never done   Colonoscopy  05/04/2020   Medicare Annual Wellness (AWV)  01/05/2023   FOOT EXAM  01/20/2023   INFLUENZA VACCINE  02/09/2023   COVID-19 Vaccine (2 - 2023-24 season) 03/12/2023   HEMOGLOBIN A1C  08/24/2023   Diabetic kidney evaluation - eGFR measurement  03/12/2024   Hepatitis C Screening  Completed   HPV VACCINES  Aged Out    Objective:  There were no vitals filed for this visit. There is no height or weight on file to calculate BMI.  Physical Exam Constitutional:      General: He is not in acute distress.    Appearance: He is normal weight. He is not toxic-appearing.  HENT:     Head: Normocephalic and atraumatic.     Right Ear: External ear normal.     Left Ear: External ear normal.     Nose: No congestion or rhinorrhea.     Mouth/Throat:     Mouth: Mucous membranes are moist.     Pharynx: Oropharynx is clear.  Eyes:     Extraocular Movements: Extraocular movements intact.     Conjunctiva/sclera: Conjunctivae normal.     Pupils: Pupils are equal, round, and reactive to light.  Cardiovascular:     Rate and Rhythm: Normal rate and regular rhythm.     Heart sounds: No murmur heard.    No friction rub. No gallop.  Pulmonary:     Effort: Pulmonary effort is normal.     Breath sounds: Normal breath sounds.  Abdominal:     General: Abdomen is flat. Bowel sounds are normal.     Palpations: Abdomen is soft.  Comments: RUQ drain in place  Musculoskeletal:        General: No swelling. Normal range of motion.     Cervical back: Normal range of motion and neck supple.  Skin:    General: Skin is warm and dry.  Neurological:     General: No focal deficit present.     Mental Status: He is oriented to person, place, and time.  Psychiatric:        Mood and Affect: Mood normal.     Lab Results Lab Results  Component Value Date    WBC 9.3 03/13/2023   HGB 10.4 (L) 03/13/2023   HCT 32.1 (L) 03/13/2023   MCV 96.1 03/13/2023   PLT 180 03/13/2023    Lab Results  Component Value Date   CREATININE 1.50 (H) 03/13/2023   BUN 33 (H) 03/13/2023   NA 136 03/13/2023   K 3.8 03/13/2023   CL 107 03/13/2023   CO2 23 03/13/2023    Lab Results  Component Value Date   ALT 47 (H) 03/03/2023   AST 69 (H) 03/03/2023   ALKPHOS 61 03/03/2023   BILITOT 1.4 (H) 03/03/2023    Lab Results  Component Value Date   CHOL 87 02/23/2023   HDL 16 (L) 02/23/2023   LDLCALC 37 02/23/2023   TRIG 171 (H) 02/23/2023   CHOLHDL 5.4 02/23/2023   No results found for: "LABRPR", "RPRTITER" No results found for: "HIV1RNAQUANT", "HIV1RNAVL", "CD4TABS"   Problem List Items Addressed This Visit   None  Assessment/Plan #Leukocytosis 2/2 cholecystitis SP drain placement with Cx+ Ecoli - Patient initially on pip-tazo during hospitalization and then placed on Augmentin -Discharge with plan for 6 week follow-up (keep drain in) till IR while drain is in Plan -pt is on Augmentin 875/125mg  pO bid while drian is in place.  -Provided IR info in AVS for f/u appt -Labs today -Follow-up with infectious disease in a month   #Medication monitoring -Wbc 9.3 on 9/9  #History of confusion and hallucinations - Longstanding history, do not think is infectious in etiology   Danelle Earthly, MD [facility] 03/20/2023, 12:48 PM

## 2023-03-20 NOTE — Patient Instructions (Addendum)
To SNF: please schedule IR f/u for drain removal/management [facility] IR Imaging Follow up.   Specialty: Radiology Why: Schedulers will call with date and time of follow-up appointment Contact information: [address] [place] [address] [phone]  Labs today F/U with ID in one month

## 2023-03-21 ENCOUNTER — Telehealth (HOSPITAL_COMMUNITY): Payer: Self-pay

## 2023-03-21 DIAGNOSIS — K810 Acute cholecystitis: Secondary | ICD-10-CM | POA: Diagnosis not present

## 2023-03-21 DIAGNOSIS — I4891 Unspecified atrial fibrillation: Secondary | ICD-10-CM | POA: Diagnosis not present

## 2023-03-21 DIAGNOSIS — I129 Hypertensive chronic kidney disease with stage 1 through stage 4 chronic kidney disease, or unspecified chronic kidney disease: Secondary | ICD-10-CM | POA: Diagnosis not present

## 2023-03-21 DIAGNOSIS — I5022 Chronic systolic (congestive) heart failure: Secondary | ICD-10-CM | POA: Diagnosis not present

## 2023-03-21 LAB — CBC WITH DIFFERENTIAL/PLATELET
Absolute Monocytes: 553 {cells}/uL (ref 200–950)
Basophils Absolute: 39 {cells}/uL (ref 0–200)
Basophils Relative: 0.6 %
Eosinophils Absolute: 52 {cells}/uL (ref 15–500)
Eosinophils Relative: 0.8 %
HCT: 34 % — ABNORMAL LOW (ref 38.5–50.0)
Hemoglobin: 11.4 g/dL — ABNORMAL LOW (ref 13.2–17.1)
Lymphs Abs: 1151 {cells}/uL (ref 850–3900)
MCH: 30.4 pg (ref 27.0–33.0)
MCHC: 33.5 g/dL (ref 32.0–36.0)
MCV: 90.7 fL (ref 80.0–100.0)
MPV: 12.1 fL (ref 7.5–12.5)
Monocytes Relative: 8.5 %
Neutro Abs: 4706 {cells}/uL (ref 1500–7800)
Neutrophils Relative %: 72.4 %
Platelets: 235 10*3/uL (ref 140–400)
RBC: 3.75 10*6/uL — ABNORMAL LOW (ref 4.20–5.80)
RDW: 17.8 % — ABNORMAL HIGH (ref 11.0–15.0)
Total Lymphocyte: 17.7 %
WBC: 6.5 10*3/uL (ref 3.8–10.8)

## 2023-03-21 LAB — COMPLETE METABOLIC PANEL WITH GFR
AG Ratio: 1.3 (calc) (ref 1.0–2.5)
ALT: 35 U/L (ref 9–46)
AST: 32 U/L (ref 10–35)
Albumin: 3.6 g/dL (ref 3.6–5.1)
Alkaline phosphatase (APISO): 78 U/L (ref 35–144)
BUN/Creatinine Ratio: 16 (calc) (ref 6–22)
BUN: 31 mg/dL — ABNORMAL HIGH (ref 7–25)
CO2: 26 mmol/L (ref 20–32)
Calcium: 9.7 mg/dL (ref 8.6–10.3)
Chloride: 101 mmol/L (ref 98–110)
Creat: 1.92 mg/dL — ABNORMAL HIGH (ref 0.70–1.28)
Globulin: 2.7 g/dL (ref 1.9–3.7)
Glucose, Bld: 124 mg/dL — ABNORMAL HIGH (ref 65–99)
Potassium: 5.2 mmol/L (ref 3.5–5.3)
Sodium: 137 mmol/L (ref 135–146)
Total Bilirubin: 1 mg/dL (ref 0.2–1.2)
Total Protein: 6.3 g/dL (ref 6.1–8.1)
eGFR: 35 mL/min/{1.73_m2} — ABNORMAL LOW (ref >=60)

## 2023-03-21 LAB — SEDIMENTATION RATE: Sed Rate: 38 mm/h — ABNORMAL HIGH (ref 0–20)

## 2023-03-21 LAB — C-REACTIVE PROTEIN: CRP: <3 mg/L (ref <8.0)

## 2023-03-21 NOTE — Telephone Encounter (Signed)
Returned call to [place] [phone] ext [phone] from [facility] to let her know appt info for Mr. Friebel, no answer, left vm. AB

## 2023-03-29 DIAGNOSIS — I5022 Chronic systolic (congestive) heart failure: Secondary | ICD-10-CM | POA: Diagnosis not present

## 2023-03-29 DIAGNOSIS — I4891 Unspecified atrial fibrillation: Secondary | ICD-10-CM | POA: Diagnosis not present

## 2023-03-29 DIAGNOSIS — K810 Acute cholecystitis: Secondary | ICD-10-CM | POA: Diagnosis not present

## 2023-03-29 DIAGNOSIS — I129 Hypertensive chronic kidney disease with stage 1 through stage 4 chronic kidney disease, or unspecified chronic kidney disease: Secondary | ICD-10-CM | POA: Diagnosis not present

## 2023-04-11 DIAGNOSIS — G4733 Obstructive sleep apnea (adult) (pediatric): Secondary | ICD-10-CM | POA: Diagnosis not present

## 2023-04-11 DIAGNOSIS — I252 Old myocardial infarction: Secondary | ICD-10-CM | POA: Diagnosis not present

## 2023-04-11 DIAGNOSIS — K819 Cholecystitis, unspecified: Secondary | ICD-10-CM | POA: Diagnosis not present

## 2023-04-11 DIAGNOSIS — E43 Unspecified severe protein-calorie malnutrition: Secondary | ICD-10-CM | POA: Diagnosis not present

## 2023-04-11 DIAGNOSIS — I2510 Atherosclerotic heart disease of native coronary artery without angina pectoris: Secondary | ICD-10-CM | POA: Diagnosis not present

## 2023-04-11 DIAGNOSIS — I5021 Acute systolic (congestive) heart failure: Secondary | ICD-10-CM | POA: Diagnosis not present

## 2023-04-11 DIAGNOSIS — K8021 Calculus of gallbladder without cholecystitis with obstruction: Secondary | ICD-10-CM | POA: Diagnosis not present

## 2023-04-11 DIAGNOSIS — E1151 Type 2 diabetes mellitus with diabetic peripheral angiopathy without gangrene: Secondary | ICD-10-CM | POA: Diagnosis not present

## 2023-04-11 DIAGNOSIS — I739 Peripheral vascular disease, unspecified: Secondary | ICD-10-CM | POA: Diagnosis not present

## 2023-04-11 DIAGNOSIS — R2681 Unsteadiness on feet: Secondary | ICD-10-CM | POA: Diagnosis not present

## 2023-04-17 ENCOUNTER — Ambulatory Visit (HOSPITAL_COMMUNITY): Payer: Medicare Other

## 2023-04-18 ENCOUNTER — Ambulatory Visit (HOSPITAL_COMMUNITY)
Admission: RE | Admit: 2023-04-18 | Discharge: 2023-04-18 | Disposition: A | Discharge status: Home / Self Care | Payer: Medicare Other | Source: Ambulatory Visit | Attending: Student | Admitting: Student

## 2023-04-18 ENCOUNTER — Other Ambulatory Visit (HOSPITAL_COMMUNITY): Payer: Self-pay | Admitting: Student

## 2023-04-18 DIAGNOSIS — K810 Acute cholecystitis: Secondary | ICD-10-CM

## 2023-04-18 DIAGNOSIS — T85520A Displacement of bile duct prosthesis, initial encounter: Secondary | ICD-10-CM | POA: Diagnosis not present

## 2023-04-18 DIAGNOSIS — K8010 Calculus of gallbladder with chronic cholecystitis without obstruction: Secondary | ICD-10-CM | POA: Insufficient documentation

## 2023-04-18 HISTORY — PX: IR EXCHANGE BILIARY DRAIN: IMG6046

## 2023-04-18 MED ORDER — IOHEXOL 300 MG/ML  SOLN
50.0000 mL | Freq: Once | INTRAMUSCULAR | Status: AC | PRN
  Administered 2023-04-18: 10 mL

## 2023-04-18 MED ORDER — LIDOCAINE HCL 1 % IJ SOLN
INTRAMUSCULAR | Status: AC
  Filled 2023-04-18: qty 20

## 2023-04-18 MED ORDER — LIDOCAINE HCL 1 % IJ SOLN: 20.0000 mL | Freq: Once | INTRAMUSCULAR | Status: DC

## 2023-04-18 NOTE — Procedures (Signed)
Interventional Radiology Procedure:   Indications: History of cholecystitis and cholecystostomy  Procedure: Attempted cholecystostomy tube exchange  Findings: Tube was occluded and could not exchange over a wire.  Old tube was completely removed.  Unable to replace tube through old tract.  Gallbladder was evaluated with [place] and demonstrated mild distention with multiple stones.  No Murphy sign.   Complications: None     EBL: Minimal  Plan: Unable to replace cholecystostomy tube.  Patient is asymptomatic and no need to urgently place another cholecystostomy.  Instructed patient and wife to follow up with General Surgery and instructed them to contact PCP or ED if he develops abdominal pain or signs of infection.   Prakash Kimberling R. Lowella Dandy, MD  Pager: [phone]

## 2023-04-19 NOTE — Progress Notes (Unsigned)
Cardiology Office Note    Patient Name: Nathan [name] Date of Encounter: 04/19/2023  Primary Care Provider:  Garlan Fillers, MD Primary Cardiologist:  Donato Schultz, MD Primary Electrophysiologist: None   Past Medical History    Past Medical History:  Diagnosis Date   Allergy    Aortic stenosis    Arthritis    Balance problem 04/26/2022   Blood transfusion without reported diagnosis    CAD (coronary artery disease)    Cataract    both eyes surgically removed   Diabetes mellitus, type 2 (HCC)    Diverticulosis of colon (without mention of hemorrhage)    Duodenal ulcer perforation (HCC)    blood transfusion   Flesh-eating bacteria (HCC) 1995   GERD (gastroesophageal reflux disease)    Glaucoma    pre glaucoma on Xalatan drops   Heart murmur    History of diabetic retinopathy    History of gastrointestinal bleeding    History of necrotizing fasciitis    History of prosthetic aortic valve    Hypercholesteremia    Myocardial infarction (HCC) 02/23/2016   Neuropathic pain 04/26/2022   Nightmares    CHRONIC   OSA (obstructive sleep apnea)    borderline no CPAP    Personal history of colonic polyps 11/23/2009   TUBULAR ADENOMA   Postoperative anemia    Sinus bradycardia    Sleep apnea    no cpap   Stomach ulcer    Hx of   Systolic hypertension    Vitamin B12 deficiency     History of Present Illness  Nathan [name] is a 77 y.o. male with a PMH of CAD, aortic stenosis s/p CABG x 3 with AVR on 10/2008, HTN, severe necrotizing fasciitis, DM type II, HLD, OSA (not on CPAP), HFrEF, ICM, atrial fibrillation who presents today for posthospital follow-up of NSTEMI and acute CHF.  Mr. Haithcock was previously followed by Dr. Patty Sermons and is currently followed by Dr. Anne Fu for management of CAD.  He underwent CABG x 3 and AVR in 2010 with complaint of chest pain in 2017 and relook cath showing occluded SVG to RCA but otherwise patent grafts treated medically.  He was last seen  by Dr. Anne Fu on 07/11/2022 and was stable with no complaints of chest pain or palpitations.    He was admitted 02/21/2023 with complaint of chest pain which he described as similar to his previous heart attack.  EKG showed ST depression in anterior leads negative initial troponins.  He was placed on heparin and nitro drip with resolution of pain and plan for left heart catheterization later that day.  He unfortunately developed increased work of breathing with chest tightness and tachycardia.  He was found to be significantly volume overloaded on exam and was placed on BiPAP with IV Lasix and transferred to the ICU.  Developed new onset AF and was placed on amiodarone drip that was later transitioned to metoprolol.  Chest x-ray was completed showing pneumonia and patient was placed on antibiotics for treatment.  Developed hemorrhagic anemia and was treated with 3 units of PRBC improvement to hemoglobin 9.  He was consulted by ID and patient found to have abnormal gallbladder with new hematoma.  Due to anemia patient was not considered for long-term AC.  2D echo was completed showing diffuse hypokinesis and reduced EF of 25-30% with severely decreased LV function.  Also developed declining renal function and anemia with altered mental status and decision was made to treat medically with no plan for  invasive evaluation.  He was discharged on 03/13/2023 with Aldactone and Jardiance with no further GDMT due to hypotension and AKI.  He was discharged to SNF for short-term rehab will follow-up with general surgery regarding acute cholecystitis.   During today's visit the patient reports*** .  Patient denies chest pain, palpitations, dyspnea, PND, orthopnea, nausea, vomiting, dizziness, syncope, edema, weight gain, or early satiety.  ***Notes: Patient needs repeat 2D echo -Last ischemic evaluation: -Last echo: -Interim ED visits: Review of Systems  Please see the history of present illness.    All other systems  reviewed and are otherwise negative except as noted above.  Physical Exam    Wt Readings from Last 3 Encounters:  03/20/23 137 lb 12.8 oz (62.5 kg)  03/13/23 138 lb 7.2 oz (62.8 kg)  01/26/23 152 lb (68.9 kg)   WU:[id number] were no vitals filed for this visit.,There is no height or weight on file to calculate BMI. GEN: Well nourished, well developed in no acute distress Neck: No JVD; No carotid bruits Pulmonary: Clear to auscultation without rales, wheezing or rhonchi  Cardiovascular: Normal rate. Regular rhythm. Normal S1. Normal S2.   Murmurs: There is no murmur.  ABDOMEN: Soft, non-tender, non-distended EXTREMITIES:  No edema; No deformity   EKG/LABS/ Recent Cardiac Studies   ECG personally reviewed by me today - ***  Risk Assessment/Calculations:   {Does this patient have ATRIAL FIBRILLATION?:[phone]}      Lab Results  Component Value Date   WBC 6.5 03/20/2023   HGB 11.4 (L) 03/20/2023   HCT 34.0 (L) 03/20/2023   MCV 90.7 03/20/2023   PLT 235 03/20/2023   Lab Results  Component Value Date   CREATININE 1.92 (H) 03/20/2023   BUN 31 (H) 03/20/2023   NA 137 03/20/2023   K 5.2 03/20/2023   CL 101 03/20/2023   CO2 26 03/20/2023   Lab Results  Component Value Date   CHOL 87 02/23/2023   HDL 16 (L) 02/23/2023   LDLCALC 37 02/23/2023   TRIG 171 (H) 02/23/2023   CHOLHDL 5.4 02/23/2023    Lab Results  Component Value Date   HGBA1C 6.1 (H) 02/21/2023   Assessment & Plan    1.  History of CAD: -s/p CABG times 09/2008 with Myoview 2017 showing anterior septal defect and repeat LHC completed showing patent LIMA to LAD and SVG to large OM with occlusion of SVG to PDA with collaterals treated medically -Today patient reports*** -Continue***  2.  HFrEF: -2D echo completed with EF of 20-25% and global hypokinesis with repeat echo on 8/26 with improved EF of 35-40%. -GDMT currently inhibited by hypotension and renal function. -Today patient is***  3.  Paroxysmal  AF: -Newly diagnosed during admission with rate currently*** -Patient is currently not a candidate for long-term AC due to acute bleed from left thigh hematoma  4.  Nonrheumatic AAS: -s/p AVR 2010 with 23 mm Edwards bioprosthetic tissue valve -SBE prophylaxis discussed  5.  Hyperlipidemia: -Patient's LDL cholesterol was***      Disposition: Follow-up with Donato Schultz, MD or APP in *** months {Are you ordering a CV Procedure (e.g. stress test, cath, DCCV, TEE, etc)?   Press F2        :[id number]}   Signed, Napoleon Form, Leodis Rains, NP 04/19/2023, 9:20 AM [facility]

## 2023-04-20 ENCOUNTER — Ambulatory Visit: Payer: Medicare Other | Admitting: Infectious Diseases

## 2023-04-20 ENCOUNTER — Ambulatory Visit: Payer: Medicare Other | Attending: Nurse Practitioner | Admitting: Nurse Practitioner

## 2023-04-20 ENCOUNTER — Encounter: Payer: Self-pay | Admitting: Infectious Diseases

## 2023-04-20 ENCOUNTER — Other Ambulatory Visit: Payer: Self-pay

## 2023-04-20 ENCOUNTER — Ambulatory Visit (INDEPENDENT_AMBULATORY_CARE_PROVIDER_SITE_OTHER): Payer: Medicare Other

## 2023-04-20 ENCOUNTER — Encounter: Payer: Self-pay | Admitting: Nurse Practitioner

## 2023-04-20 VITALS — BP 110/66 | HR 80 | Resp 16 | Ht 67.0 in | Wt 138.8 lb

## 2023-04-20 VITALS — BP 108/60 | HR 65 | Ht 67.0 in | Wt 136.8 lb

## 2023-04-20 DIAGNOSIS — I10 Essential (primary) hypertension: Secondary | ICD-10-CM | POA: Insufficient documentation

## 2023-04-20 DIAGNOSIS — E785 Hyperlipidemia, unspecified: Secondary | ICD-10-CM | POA: Diagnosis not present

## 2023-04-20 DIAGNOSIS — Z95828 Presence of other vascular implants and grafts: Secondary | ICD-10-CM | POA: Insufficient documentation

## 2023-04-20 DIAGNOSIS — K819 Cholecystitis, unspecified: Secondary | ICD-10-CM | POA: Diagnosis not present

## 2023-04-20 DIAGNOSIS — I25810 Atherosclerosis of coronary artery bypass graft(s) without angina pectoris: Secondary | ICD-10-CM | POA: Insufficient documentation

## 2023-04-20 DIAGNOSIS — I480 Paroxysmal atrial fibrillation: Secondary | ICD-10-CM | POA: Diagnosis not present

## 2023-04-20 DIAGNOSIS — Z952 Presence of prosthetic heart valve: Secondary | ICD-10-CM | POA: Diagnosis not present

## 2023-04-20 DIAGNOSIS — I5020 Unspecified systolic (congestive) heart failure: Secondary | ICD-10-CM | POA: Diagnosis not present

## 2023-04-20 DIAGNOSIS — Z79899 Other long term (current) drug therapy: Secondary | ICD-10-CM

## 2023-04-20 NOTE — Patient Instructions (Addendum)
Medication Instructions:  WHEN YOU GET HOME CHECK YOUR MEDICATION AND LET us KNOW IF YOU ARE TAKING SPIRONOLACTONE  IF YOU ARE NOT TAKING SPIRONOLACTONE WE WILL HAVE YOU RESTART JARDIANCE 10MG  TAKE 1 TABLET DAILY  *If you need a refill on your cardiac medications before your next appointment, please call your pharmacy*   Lab Work: TODAY-BMET, BNP & CBC If you have labs (blood work) drawn today and your tests are completely normal, you will receive your results only by: MyChart Message (if you have MyChart) OR A paper copy in the mail If you have any lab test that is abnormal or we need to change your treatment, we will call you to review the results.   Testing/Procedures: Christena Deem- Long Term Monitor Instructions  Your physician has requested you wear a ZIO patch monitor for 14 days.  This is a single patch monitor. [employer] supplies one patch monitor per enrollment. Additional stickers are not available. Please do not apply patch if you will be having a Nuclear Stress Test,  Echocardiogram, Cardiac CT, MRI, or Chest Xray during the period you would be wearing the  monitor. The patch cannot be worn during these tests. You cannot remove and re-apply the  ZIO XT patch monitor.  Your ZIO patch monitor will be mailed 3 day USPS to your address on file. It may take 3-5 days  to receive your monitor after you have been enrolled.  Once you have received your monitor, please review the enclosed instructions. Your monitor  has already been registered assigning a specific monitor serial # to you.  Billing and Patient Assistance Program Information  We have supplied [employer] with any of your insurance information on file for billing purposes. [employer] offers a sliding scale Patient Assistance Program for patients that do not have  insurance, or whose insurance does not completely cover the cost of the ZIO monitor.  You must apply for the Patient Assistance Program to qualify for this discounted rate.   To apply, please call [employer] at [phone], select option 4, select option 2, ask to apply for  Patient Assistance Program. Meredeth Ide will ask your household income, and how many people  are in your household. They will quote your out-of-pocket cost based on that information.  [employer] will also be able to set up a 80-month, interest-free payment plan if needed.  Applying the monitor   Shave hair from upper left chest.  Hold abrader disc by orange tab. Rub abrader in 40 strokes over the upper left chest as  indicated in your monitor instructions.  Clean area with 4 enclosed alcohol pads. Let dry.  Apply patch as indicated in monitor instructions. Patch will be placed under collarbone on left  side of chest with arrow pointing upward.  Rub patch adhesive wings for 2 minutes. Remove white label marked "1". Remove the white  label marked "2". Rub patch adhesive wings for 2 additional minutes.  While looking in a mirror, press and release button in center of patch. A small green light will  flash 3-4 times. This will be your only indicator that the monitor has been turned on.  Do not shower for the first 24 hours. You may shower after the first 24 hours.  Press the button if you feel a symptom. You will hear a small click. Record Date, Time and  Symptom in the Patient Logbook.  When you are ready to remove the patch, follow instructions on the last 2 pages of Patient  Logbook. Stick  patch monitor onto the last page of Patient Logbook.  Place Patient Logbook in the blue and white box. Use locking tab on box and tape box closed  securely. The blue and white box has prepaid postage on it. Please place it in the mailbox as  soon as possible. Your physician should have your test results approximately 7 days after the  monitor has been mailed back to [facility].  Call [facility] Customer Care at [phone] if you have questions regarding  your ZIO XT patch monitor. Call them immediately  if you see an orange light blinking on your  monitor.  If your monitor falls off in less than 4 days, contact our Monitor department at [phone].  If your monitor becomes loose or falls off after 4 days call [employer] at [phone] for  suggestions on securing your monitor   Follow-Up: At [facility], you and your health needs are our priority.  As part of our continuing mission to provide you with exceptional heart care, we have created designated Provider Care Teams.  These Care Teams include your primary Cardiologist (physician) and Advanced Practice Providers (APPs -  Physician Assistants and Nurse Practitioners) who all work together to provide you with the care you need, when you need it.  We recommend signing up for the patient portal called "MyChart".  Sign up information is provided on this After Visit Summary.  MyChart is used to connect with patients for Virtual Visits (Telemedicine).  Patients are able to view lab/test results, encounter notes, upcoming appointments, etc.  Non-urgent messages can be sent to your provider as well.   To learn more about what you can do with MyChart, go to [email or web address].    Your next appointment:   2 month(s)  Provider:   Donato Schultz, MD  or Robin Searing, NP   Other Instructions Please check your weight daily. Please contact the office if you gain more than 2lbs in a day or 5lbs in a week.  Limit your salt intake to 1500-2000mg  per day or 500mg  of Sodium per meal.  Check your blood pressure daily for 2 weeks, then contact the office with your readings.  Make sure to check 2 hours after your medications.   AVOID these things for 30 minutes before checking your blood pressure: No Drinking caffeine. No Drinking alcohol. No Eating. No Smoking. No Exercising.  Five minutes before checking your blood pressure: Pee. Sit in a dining chair. Avoid sitting in a soft couch or armchair. Be quiet. Do not talk.

## 2023-04-20 NOTE — Progress Notes (Unsigned)
Applied a 14 day Zio XT monitor to patient in the office  Skains to read

## 2023-04-20 NOTE — Progress Notes (Addendum)
Patient Active Problem List   Diagnosis Date Noted   S/P PICC central line placement 04/20/2023   Protein-calorie malnutrition, severe 03/09/2023   Groin hematoma 03/03/2023   Acute cholecystitis 03/02/2023   Paroxysmal atrial fibrillation (HCC) 03/02/2023   Acute posthemorrhagic anemia 03/02/2023   Hypokalemia 03/02/2023   Acute kidney injury on CKD stage 3a, GFR 45-59 ml/min (HCC) 03/01/2023   CKD stage 3a, GFR 45-59 ml/min (HCC) 03/01/2023   Acute metabolic encephalopathy 02/28/2023   Myocardial infarction due to demand ischemia (HCC) 02/21/2023   S/P AVR 02/21/2023   Acute on chronic systolic CHF (congestive heart failure) (HCC) 02/21/2023   Acute respiratory failure with hypoxia (HCC) 02/21/2023   Abdominal pain 01/21/2023   Tachycardia 01/21/2023   Elevated LFTs 01/21/2023   Balance problem 04/26/2022   Neuropathic pain 04/26/2022   Primary open-angle glaucoma, bilateral, mild stage 08/16/2021   Pseudophakia, both eyes 08/16/2021   Moderate nonproliferative diabetic retinopathy of both eyes without macular edema associated with type 2 diabetes mellitus (HCC) 08/16/2021   Posterior vitreous detachment of both eyes 08/16/2021   DM (diabetes mellitus), secondary, with neurologic complications (HCC) 06/13/2017   Obesity (BMI 30-39.9) 03/15/2016   Abnormal nuclear stress test 03/01/2016    umbilical hernia repair open with Ventralex mesh Oct 2014 04/10/2013   Other general symptoms  01/06/2011   History of colonic polyps 01/06/2011   Iron deficiency anemia 01/06/2011   Gastritis, chronic 01/06/2011   Benign hypertensive heart disease without heart failure 10/13/2010   Hx of CABG 10/13/2010   Systolic hypertension    Sinus bradycardia    CAD (coronary artery disease)    History of prosthetic aortic valve    History of diabetic retinopathy    History of necrotizing fasciitis    Postoperative anemia    Nightmares    B12 DEFICIENCY 12/02/2009   ESOPHAGEAL REFLUX  11/19/2009   CHEST PAIN 11/19/2009   BLOOD IN STOOL, OCCULT 11/19/2009   ANEMIA-IRON DEFICIENCY 08/19/2008   S/P aortic valve replacement with bioprosthetic valve 08/19/2008   Hemorrhage of gastrointestinal tract 08/19/2008   Type 2 diabetes mellitus with hyperlipidemia (HCC) 08/14/2008   Dyslipidemia 08/14/2008   Anxiety state 08/14/2008   OBSTRUCTIVE SLEEP APNEA 08/14/2008   Unspecified glaucoma 08/14/2008   Essential hypertension 08/14/2008   PHARYNGITIS 08/14/2008   Gastric ulcer 08/14/2008   Gastritis and gastroduodenitis 08/14/2008   Diverticulosis of colon 08/14/2008    Patient's Medications  New Prescriptions   No medications on file  Previous Medications   ACETAMINOPHEN (TYLENOL) 500 MG TABLET    Take 500 mg by mouth daily as needed for moderate pain.   ALLOPURINOL (ZYLOPRIM) 300 MG TABLET    Take 300 mg by mouth daily.   ASCORBIC ACID (VITAMIN C) 500 MG TABLET    Take by mouth daily.   ATORVASTATIN (LIPITOR) 80 MG TABLET    Take 1 tablet (80 mg total) by mouth daily.   CHOLECALCIFEROL (VITAMIN D-3 PO)    Take 1 capsule by mouth in the morning and at bedtime.   COENZYME Q10 (COQ10) 100 MG CAPS    Take 100 mg by mouth daily.   CYANOCOBALAMIN (VITAMIN B-12 IJ)    Inject 1,000 mcg into the muscle every 30 (thirty) days. Amt/dose is unknown   CYCLOSPORINE (RESTASIS) 0.05 % OPHTHALMIC EMULSION    Place 1 drop into both eyes 2 (two) times daily.   EMPAGLIFLOZIN (JARDIANCE) 10 MG TABS TABLET    Take 1 tablet (10 mg  total) by mouth daily.   FISH OIL-OMEGA-3 FATTY ACIDS 1000 MG CAPSULE    Take by mouth daily.   FLUOROURACIL (EFUDEX) 5 % CREAM    Apply topically daily.   GLIPIZIDE (GLUCOTROL) 5 MG TABLET    Take 5 mg by mouth every morning.   LATANOPROST (XALATAN) 0.005 % OPHTHALMIC SOLUTION    Place 1 drop into both eyes at bedtime.   METFORMIN (GLUCOPHAGE) 1000 MG TABLET    Take 1,000 mg by mouth in the morning and at bedtime.   METOPROLOL SUCCINATE (TOPROL-XL) 50 MG 24 HR TABLET     Take 1 tablet (50 mg total) by mouth daily. Take with or immediately following a meal.   MINOXIDIL (LONITEN) 10 MG TABLET    Take 10 mg by mouth daily.    MULTIPLE VITAMIN (MULTIVITAMIN) TABLET    Take 1 tablet by mouth daily.   NITROGLYCERIN (NITROSTAT) 0.4 MG SL TABLET    Place 1 tablet under the tongue as needed.   QUETIAPINE (SEROQUEL) 50 MG TABLET    Take 1 tablet (50 mg total) by mouth every evening.   SACCHAROMYCES BOULARDII (FLORASTOR) 250 MG CAPSULE    Take 1 capsule (250 mg total) by mouth 2 (two) times daily.   SPIRONOLACTONE (ALDACTONE) 25 MG TABLET    Take 0.5 tablets (12.5 mg total) by mouth daily.   TROLAMINE SALICYLATE (ASPERCREME) 10 % CREAM    Apply 1 application  topically as needed for muscle pain.   VITAMIN A PO    Take by mouth daily.  Modified Medications   No medications on file  Discontinued Medications   No medications on file    Subjective: Per last note by Dr Thedore Mins  " 77 year old male with history of hallucinations, diabetes A1c 6.1 and other past medical history as below presents for hospital follow-up of cholecystitis.  He was initially admitted to [facility] for NSTEMI found to have new onset A-fib.  He had hallucinations and leukocytosis, ID engaged for possible infectious etiology.  CT abdomen pelvis showed signs of cholecystitis.  Mild elevation entrance and lysis.  General surgery did a HIDA scan which was positive.  Did not recommend cholecystectomy due to high residuals seizure.  IR engaged and patient underwent cholecystotomy tube placement with cultures growing E. coli and 8/22.  He was initially on pip-tazo and then transition to Augmentin on discharge.  On discharge plan was to leave the tube in for about 6 weeks, follow-up with interventional radiology Today: He reports he is tolerating antibiotics.  No new complaints.  Presents with family."  04/20/23 Accompanied by wife. Reports completed augmentin  two to three weeks ago and has been without  symptoms such as fever, chills, nausea, or vomiting since.The drain was intended to be replaced, but it had dislodged and occluded from GB internally. Instead of replacing it, the drain was removed entirely on 10/8.  The patient has been eating less, particularly less sugar, but is otherwise maintaining his diet. He reports no abdominal pain, but did experience some discomfort around the bandage site upon waking, which resolved on its own. He has a fu with General surgery on Monday. No complaints otherwise    Review of Systems: Review of Systems  All other systems reviewed and are negative.   Past Medical History:  Diagnosis Date   Allergy    Aortic stenosis    Arthritis    Balance problem 04/26/2022   Blood transfusion without reported diagnosis    CAD (coronary  artery disease)    Cataract    both eyes surgically removed   Diabetes mellitus, type 2 (HCC)    Diverticulosis of colon (without mention of hemorrhage)    Duodenal ulcer perforation (HCC)    blood transfusion   Flesh-eating bacteria (HCC) 1995   GERD (gastroesophageal reflux disease)    Glaucoma    pre glaucoma on Xalatan drops   Heart murmur    History of diabetic retinopathy    History of gastrointestinal bleeding    History of necrotizing fasciitis    History of prosthetic aortic valve    Hypercholesteremia    Myocardial infarction (HCC) 02/23/2016   Neuropathic pain 04/26/2022   Nightmares    CHRONIC   OSA (obstructive sleep apnea)    borderline no CPAP    Personal history of colonic polyps 11/23/2009   TUBULAR ADENOMA   Postoperative anemia    Sinus bradycardia    Sleep apnea    no cpap   Stomach ulcer    Hx of   Systolic hypertension    Vitamin B12 deficiency    Past Surgical History:  Procedure Laterality Date   AORTIC VALVE REPLACEMENT     BLEPHAROPLASTY  11-24-14   CARDIAC CATHETERIZATION N/A 03/01/2016   Procedure: Coronary/Graft Angiography;  Surgeon: Peter M [place], MD;  Location: [facility] INVASIVE CV  LAB;  Service: Cardiovascular;  Laterality: N/A;   CATARACT EXTRACTION W/ INTRAOCULAR LENS IMPLANT     OD   COLONOSCOPY     CORONARY ARTERY BYPASS GRAFT  2010   x 4   flesh eating disease surgery      surgeries x 7    HERNIA REPAIR  1972   IR EXCHANGE BILIARY DRAIN  03/07/2023   IR EXCHANGE BILIARY DRAIN  04/18/2023   IR PERC CHOLECYSTOSTOMY  03/02/2023   POLYPECTOMY     RETINAL LASER PROCEDURE     SCROTAL SURGERY  1995   resection   UMBILICAL HERNIA REPAIR N/A 04/10/2013   Procedure: HERNIA REPAIR UMBILICAL ADULT with mesh;  Surgeon: Valarie Merino, MD;  Location: [facility] ORS;  Service: General;  Laterality: N/A;    Social History   Tobacco Use   Smoking status: Never   Smokeless tobacco: Never  Vaping Use   Vaping status: Never Used  Substance Use Topics   Alcohol use: No    Alcohol/week: 0.0 standard drinks of alcohol   Drug use: No    Family History  Problem Relation Age of Onset   Cirrhosis Father    Alcohol abuse Father        father murdered   Rectal cancer Mother    Diabetes Mother    Colon cancer Mother 51       rectal cancer   Hypertension Mother    Hypertension Sister    Diabetes Sister    Heart attack Neg Hx    Stroke Neg Hx    Colon polyps Neg Hx    Esophageal cancer Neg Hx    Stomach cancer Neg Hx     Allergies  Allergen Reactions   Shellfish-Derived Products Anaphylaxis   Zolpidem Tartrate Other (See Comments)    Hallucinations    Health Maintenance  Topic Date Due   OPHTHALMOLOGY EXAM  Never done   Diabetic kidney evaluation - Urine ACR  Never done   DTaP/Tdap/Td (1 - Tdap) Never done   Pneumonia Vaccine 36+ Years old (1 of 1 - PCV) Never done   COVID-19 Vaccine (2 - Pfizer risk series)  04/28/2020   Colonoscopy  05/04/2020   Medicare Annual Wellness (AWV)  01/05/2023   FOOT EXAM  01/20/2023   INFLUENZA VACCINE  02/09/2023   HEMOGLOBIN A1C  08/24/2023   Diabetic kidney evaluation - eGFR measurement  03/19/2024   Hepatitis C Screening   Completed   Zoster Vaccines- Shingrix  Completed   HPV VACCINES  Aged Out    Objective: BP 110/66   Pulse 80   Resp 16   Ht 5\' 7"  (1.702 m)   Wt 138 lb 12.8 oz (63 kg)   SpO2 98%   BMI 21.74 kg/m   Exam Adult male sitting in the chair, non toxic HEENT wnl  Heart - Irregular rhythm, normal rate Chest - normal respiratory effort, CTAB Abdomen - soft, non tender, RUQ bandaged at drain removal site MSK - no pedal edema Neuro - awake, alert and answers questions appropriately  Lab Results Lab Results  Component Value Date   WBC 6.5 03/20/2023   HGB 11.4 (L) 03/20/2023   HCT 34.0 (L) 03/20/2023   MCV 90.7 03/20/2023   PLT 235 03/20/2023    Lab Results  Component Value Date   CREATININE 1.92 (H) 03/20/2023   BUN 31 (H) 03/20/2023   NA 137 03/20/2023   K 5.2 03/20/2023   CL 101 03/20/2023   CO2 26 03/20/2023    Lab Results  Component Value Date   ALT 35 03/20/2023   AST 32 03/20/2023   ALKPHOS 61 03/03/2023   BILITOT 1.0 03/20/2023    Lab Results  Component Value Date   CHOL 87 02/23/2023   HDL 16 (L) 02/23/2023   LDLCALC 37 02/23/2023   TRIG 171 (H) 02/23/2023   CHOLHDL 5.4 02/23/2023   No results found for: "LABRPR", "RPRTITER" No results found for: "HIV1RNAQUANT", "HIV1RNAVL", "CD4TABS"    Assessment/Plan # Cholecystitis SP drain placement with Cx+ Ecoli - Patient initially on pip-tazo during hospitalization and then placed on Augmentin -  Discharge with plan for 6 week follow-up (keep drain in) till IR while drain is in - completed PO augmentin approx 2-3 weeks ago, clinically improving  Plan Fu with General surgery next Monday for possible need of cholecystectomy  Fu with [place] as needed    # Medication monitoring No labs today as already off abtx and improving  # History of confusion and hallucinations Seems to be oriented and answered questions appropriately   # AS s/p bioprosthetic AVR He is aware about abtx prophylaxis for SBE  Victoriano Lain, MD [facility] 04/20/2023, 1:23 PM

## 2023-04-21 ENCOUNTER — Other Ambulatory Visit: Payer: Self-pay

## 2023-04-21 LAB — CBC
Hematocrit: 36.2 % — ABNORMAL LOW (ref 37.5–51.0)
Hemoglobin: 11.3 g/dL — ABNORMAL LOW (ref 13.0–17.7)
MCH: 30.1 pg (ref 26.6–33.0)
MCHC: 31.2 g/dL — ABNORMAL LOW (ref 31.5–35.7)
MCV: 96 fL (ref 79–97)
Platelets: 175 10*3/uL (ref 150–450)
RBC: 3.76 x10E6/uL — ABNORMAL LOW (ref 4.14–5.80)
RDW: 16.4 % — ABNORMAL HIGH (ref 11.6–15.4)
WBC: 7.5 10*3/uL (ref 3.4–10.8)

## 2023-04-21 LAB — BASIC METABOLIC PANEL
BUN/Creatinine Ratio: 22 (ref 10–24)
BUN: 37 mg/dL — ABNORMAL HIGH (ref 8–27)
CO2: 20 mmol/L (ref 20–29)
Calcium: 9.6 mg/dL (ref 8.6–10.2)
Chloride: 101 mmol/L (ref 96–106)
Creatinine, Ser: 1.7 mg/dL — ABNORMAL HIGH (ref 0.76–1.27)
Glucose: 160 mg/dL — ABNORMAL HIGH (ref 70–99)
Potassium: 4.8 mmol/L (ref 3.5–5.2)
Sodium: 135 mmol/L (ref 134–144)
eGFR: 41 mL/min/{1.73_m2} — ABNORMAL LOW (ref >59)

## 2023-04-21 LAB — PRO B NATRIURETIC PEPTIDE: NT-Pro BNP: 7080 pg/mL — ABNORMAL HIGH (ref 0–486)

## 2023-04-21 MED ORDER — SPIRONOLACTONE 25 MG PO TABS: 12.5000 mg | ORAL_TABLET | Freq: Every day | ORAL | 3 refills | Status: DC

## 2023-04-21 MED ORDER — EMPAGLIFLOZIN 10 MG PO TABS: 10.0000 mg | ORAL_TABLET | Freq: Every day | ORAL | 6 refills | Status: DC

## 2023-04-21 NOTE — Telephone Encounter (Signed)
Contacted the patient and spoke with his wife. I explained to her that Alden Server would like for the patient to take both Jardiance and Spironolactone. And that the Spironolactone should be cut I half and the patient is to take a half tablet once a day. She voiced understanding but stated the pharmacy only gave them the Spironolactone. She requested I contacted the pharmacy.   I called the pharmacy and they stated they are currently working on the Mier and will contact the patient when it is ready for pick up.   Contacted wife and made her aware. She thanked me for my help and voiced understanding.

## 2023-04-21 NOTE — Telephone Encounter (Signed)
Gaston Islam., NP  You14 hours ago (5:46 PM)    Rhae Hammock,  Please send prescription for Jardiance 10 mg and spironolactone 12.5 mg daily to pharmacy on file for [name].  Thanks Alden Server

## 2023-04-24 DIAGNOSIS — T85518S Breakdown (mechanical) of other gastrointestinal prosthetic devices, implants and grafts, sequela: Secondary | ICD-10-CM | POA: Diagnosis not present

## 2023-04-24 DIAGNOSIS — K8020 Calculus of gallbladder without cholecystitis without obstruction: Secondary | ICD-10-CM | POA: Diagnosis not present

## 2023-05-04 DIAGNOSIS — D510 Vitamin B12 deficiency anemia due to intrinsic factor deficiency: Secondary | ICD-10-CM | POA: Diagnosis not present

## 2023-05-09 DIAGNOSIS — I25810 Atherosclerosis of coronary artery bypass graft(s) without angina pectoris: Secondary | ICD-10-CM | POA: Diagnosis not present

## 2023-05-09 DIAGNOSIS — Z23 Encounter for immunization: Secondary | ICD-10-CM | POA: Diagnosis not present

## 2023-05-09 DIAGNOSIS — I10 Essential (primary) hypertension: Secondary | ICD-10-CM | POA: Diagnosis not present

## 2023-05-23 ENCOUNTER — Encounter: Payer: Self-pay | Admitting: Podiatry

## 2023-05-23 ENCOUNTER — Ambulatory Visit (INDEPENDENT_AMBULATORY_CARE_PROVIDER_SITE_OTHER): Payer: Medicare Other | Admitting: Podiatry

## 2023-05-23 DIAGNOSIS — E1142 Type 2 diabetes mellitus with diabetic polyneuropathy: Secondary | ICD-10-CM | POA: Diagnosis not present

## 2023-05-23 DIAGNOSIS — M2042 Other hammer toe(s) (acquired), left foot: Secondary | ICD-10-CM

## 2023-05-23 DIAGNOSIS — E119 Type 2 diabetes mellitus without complications: Secondary | ICD-10-CM | POA: Diagnosis not present

## 2023-05-23 DIAGNOSIS — M2041 Other hammer toe(s) (acquired), right foot: Secondary | ICD-10-CM

## 2023-05-23 DIAGNOSIS — M79674 Pain in right toe(s): Secondary | ICD-10-CM | POA: Diagnosis not present

## 2023-05-23 DIAGNOSIS — B351 Tinea unguium: Secondary | ICD-10-CM

## 2023-05-23 DIAGNOSIS — M79675 Pain in left toe(s): Secondary | ICD-10-CM

## 2023-05-28 NOTE — Progress Notes (Signed)
ANNUAL DIABETIC FOOT EXAM  Subjective: Nathan [name] presents today for annual diabetic foot exam.  Chief Complaint  Patient presents with   Diabetes    DFC A1C: 6.5 PATIENT STATES THE LAST TIME HE SEEN HIS PCP WAS ABOUT A MONTH AGO    Patient confirms h/o diabetes.  Patient denies any h/o foot wounds.  Patient has been diagnosed with neuropathy.  Nathan Fillers, MD is patient's PCP.  Past Medical History:  Diagnosis Date   Allergy    Aortic stenosis    Arthritis    Balance problem 04/26/2022   Blood transfusion without reported diagnosis    CAD (coronary artery disease)    Cataract    both eyes surgically removed   Diabetes mellitus, type 2 (HCC)    Diverticulosis of colon (without mention of hemorrhage)    Duodenal ulcer perforation (HCC)    blood transfusion   Flesh-eating bacteria (HCC) 1995   GERD (gastroesophageal reflux disease)    Glaucoma    pre glaucoma on Xalatan drops   Heart murmur    History of diabetic retinopathy    History of gastrointestinal bleeding    History of necrotizing fasciitis    History of prosthetic aortic valve    Hypercholesteremia    Myocardial infarction (HCC) 02/23/2016   Neuropathic pain 04/26/2022   Nightmares    CHRONIC   OSA (obstructive sleep apnea)    borderline no CPAP    Personal history of colonic polyps 11/23/2009   TUBULAR ADENOMA   Postoperative anemia    Sinus bradycardia    Sleep apnea    no cpap   Stomach ulcer    Hx of   Systolic hypertension    Vitamin B12 deficiency    Patient Active Problem List   Diagnosis Date Noted   S/P PICC central line placement 04/20/2023   Cholecystitis 04/20/2023   Medication management 04/20/2023   Protein-calorie malnutrition, severe 03/09/2023   Groin hematoma 03/03/2023   Acute cholecystitis 03/02/2023   Paroxysmal atrial fibrillation (HCC) 03/02/2023   Acute posthemorrhagic anemia 03/02/2023   Hypokalemia 03/02/2023   Acute kidney injury on CKD stage 3a, GFR  45-59 ml/min (HCC) 03/01/2023   CKD stage 3a, GFR 45-59 ml/min (HCC) 03/01/2023   Acute metabolic encephalopathy 02/28/2023   Myocardial infarction due to demand ischemia (HCC) 02/21/2023   S/P AVR 02/21/2023   Acute on chronic systolic CHF (congestive heart failure) (HCC) 02/21/2023   Acute respiratory failure with hypoxia (HCC) 02/21/2023   Abdominal pain 01/21/2023   Tachycardia 01/21/2023   Elevated LFTs 01/21/2023   Balance problem 04/26/2022   Neuropathic pain 04/26/2022   Primary open-angle glaucoma, bilateral, mild stage 08/16/2021   Pseudophakia, both eyes 08/16/2021   Moderate nonproliferative diabetic retinopathy of both eyes without macular edema associated with type 2 diabetes mellitus (HCC) 08/16/2021   Posterior vitreous detachment of both eyes 08/16/2021   DM (diabetes mellitus), secondary, with neurologic complications (HCC) 06/13/2017   Obesity (BMI 30-39.9) 03/15/2016   Abnormal nuclear stress test 03/01/2016    umbilical hernia repair open with Ventralex mesh Oct 2014 04/10/2013   Other general symptoms  01/06/2011   History of colonic polyps 01/06/2011   Iron deficiency anemia 01/06/2011   Gastritis, chronic 01/06/2011   Benign hypertensive heart disease without heart failure 10/13/2010   Hx of CABG 10/13/2010   Systolic hypertension    Sinus bradycardia    CAD (coronary artery disease)    History of prosthetic aortic valve    History of  diabetic retinopathy    History of necrotizing fasciitis    Postoperative anemia    Nightmares    B12 DEFICIENCY 12/02/2009   ESOPHAGEAL REFLUX 11/19/2009   CHEST PAIN 11/19/2009   BLOOD IN STOOL, OCCULT 11/19/2009   ANEMIA-IRON DEFICIENCY 08/19/2008   S/P aortic valve replacement with bioprosthetic valve 08/19/2008   Hemorrhage of gastrointestinal tract 08/19/2008   Type 2 diabetes mellitus with hyperlipidemia (HCC) 08/14/2008   Dyslipidemia 08/14/2008   Anxiety state 08/14/2008   OBSTRUCTIVE SLEEP APNEA 08/14/2008    Unspecified glaucoma 08/14/2008   Essential hypertension 08/14/2008   PHARYNGITIS 08/14/2008   Gastric ulcer 08/14/2008   Gastritis and gastroduodenitis 08/14/2008   Diverticulosis of colon 08/14/2008   Past Surgical History:  Procedure Laterality Date   AORTIC VALVE REPLACEMENT     BLEPHAROPLASTY  11-24-14   CARDIAC CATHETERIZATION N/A 03/01/2016   Procedure: Coronary/Graft Angiography;  Surgeon: Peter M Swazil[place], MD;  Location: [facility] INVASIVE CV LAB;  Service: Cardiovascular;  Laterality: N/A;   CATARACT EXTRACTION W/ INTRAOCULAR LENS IMPLANT     OD   COLONOSCOPY     CORONARY ARTERY BYPASS GRAFT  2010   x 4   flesh eating disease surgery      surgeries x 7    HERNIA REPAIR  1972   IR EXCHANGE BILIARY DRAIN  03/07/2023   IR EXCHANGE BILIARY DRAIN  04/18/2023   IR PERC CHOLECYSTOSTOMY  03/02/2023   POLYPECTOMY     RETINAL LASER PROCEDURE     SCROTAL SURGERY  1995   resection   UMBILICAL HERNIA REPAIR N/A 04/10/2013   Procedure: HERNIA REPAIR UMBILICAL ADULT with mesh;  Surgeon: Valarie Merino, MD;  Location: [facility] ORS;  Service: General;  Laterality: N/A;   Current Outpatient Medications on File Prior to Visit  Medication Sig Dispense Refill   acetaminophen (TYLENOL) 500 MG tablet Take 500 mg by mouth daily as needed for moderate pain.     allopurinol (ZYLOPRIM) 300 MG tablet Take 300 mg by mouth daily.     ascorbic acid (VITAMIN C) 500 MG tablet Take by mouth daily.     atorvastatin (LIPITOR) 80 MG tablet Take 1 tablet (80 mg total) by mouth daily.     Cholecalciferol (VITAMIN D-3 PO) Take 1 capsule by mouth in the morning and at bedtime.     Coenzyme Q10 (COQ10) 100 MG CAPS Take 100 mg by mouth daily.     Cyanocobalamin (VITAMIN B-12 IJ) Inject 1,000 mcg into the muscle every 30 (thirty) days. Amt/dose is unknown     cycloSPORINE (RESTASIS) 0.05 % ophthalmic emulsion Place 1 drop into both eyes 2 (two) times daily.     empagliflozin (JARDIANCE) 10 MG TABS tablet Take 1 tablet (10 mg  total) by mouth daily. 30 tablet 6   fish oil-omega-3 fatty acids 1000 MG capsule Take by mouth daily.     fluorouracil (EFUDEX) 5 % cream Apply topically daily.     glipiZIDE (GLUCOTROL) 5 MG tablet Take 5 mg by mouth every morning.     latanoprost (XALATAN) 0.005 % ophthalmic solution Place 1 drop into both eyes at bedtime.     metFORMIN (GLUCOPHAGE) 1000 MG tablet Take 1,000 mg by mouth in the morning and at bedtime.     metoprolol succinate (TOPROL-XL) 50 MG 24 hr tablet Take 1 tablet (50 mg total) by mouth daily. Take with or immediately following a meal.     minoxidil (LONITEN) 10 MG tablet Take 10 mg by mouth daily.  5   Multiple Vitamin (MULTIVITAMIN) tablet Take 1 tablet by mouth daily.     nitroGLYCERIN (NITROSTAT) 0.4 MG SL tablet Place 1 tablet under the tongue as needed.  12   QUEtiapine (SEROQUEL) 50 MG tablet Take 1 tablet (50 mg total) by mouth every evening.     saccharomyces boulardii (FLORASTOR) 250 MG capsule Take 1 capsule (250 mg total) by mouth 2 (two) times daily.     spironolactone (ALDACTONE) 25 MG tablet Take 0.5 tablets (12.5 mg total) by mouth daily. 30 tablet 3   trolamine salicylate (ASPERCREME) 10 % cream Apply 1 application  topically as needed for muscle pain.     VITAMIN A PO Take by mouth daily.     No current facility-administered medications on file prior to visit.    Allergies  Allergen Reactions   Shellfish-Derived Products Anaphylaxis   Zolpidem Tartrate Other (See Comments)    Hallucinations   Social History   Occupational History   Occupation: Investment banker, corporate: [employer]   Occupation: Retired  Tobacco Use   Smoking status: Never   Smokeless tobacco: Never  Vaping Use   Vaping status: Never Used  Substance and Sexual Activity   Alcohol use: No    Alcohol/week: 0.0 standard drinks of alcohol   Drug use: No   Sexual activity: Not on file   Family History  Problem Relation Age of Onset   Cirrhosis Father    Alcohol abuse  Father        father murdered   Rectal cancer Mother    Diabetes Mother    Colon cancer Mother 67       rectal cancer   Hypertension Mother    Hypertension Sister    Diabetes Sister    Heart attack Neg Hx    Stroke Neg Hx    Colon polyps Neg Hx    Esophageal cancer Neg Hx    Stomach cancer Neg Hx    Immunization History  Administered Date(s) Administered   Influenza Whole 04/07/2010   Influenza,inj,Quad PF,6+ Mos 04/11/2013   PFIZER(Purple Top)SARS-COV-2 Vaccination 04/07/2020   Zoster Recombinant(Shingrix) 11/30/2017, 03/10/2018     Review of Systems: Negative except as noted in the HPI.   Objective: There were no vitals filed for this visit.  Nathan [name] is a pleasant 77 y.o. male in NAD. AAO X 3.  Title   Diabetic Foot Exam - detailed Date & Time: 05/23/2023  4:00 PM Diabetic Foot exam was performed with the following findings: Yes  Visual Foot Exam completed.: Yes  Is there a history of foot ulcer?: No Is there a foot ulcer now?: No Is there swelling?: No Is there elevated skin temperature?: No Is there abnormal foot shape?: Yes (Comment: Hammertoes b/l) Is there a claw toe deformity?: No Are the toenails long?: Yes Are the toenails thick?: Yes Are the toenails ingrown?: No Is the skin thin, fragile, shiny and hairless?": No Normal Range of Motion?: Yes Is there foot or ankle muscle weakness?: No Do you have pain in calf while walking?: No Are the shoes appropriate in style and fit?: Yes Can the patient see the bottom of their feet?: No Pulse Foot Exam completed.: Yes   Right Posterior Tibialis: Present Left posterior Tibialis: Present   Right Dorsalis Pedis: Present Left Dorsalis Pedis: Present     Sensory Foot Exam Completed.: Yes Semmes-Weinstein Monofilament Test "+" means "has sensation" and "-" means "no sensation"  R Foot Test Control: Neg L  Foot Test Control: Neg   R Site 1-Great Toe: Neg L Site 1-Great Toe: Neg   R Site 4: Neg L Site 4:  Neg   R site 5: Neg L Site 5: Neg  R Site 6: Neg L Site 6: Neg     Image components are not supported.   Image components are not supported. Image components are not supported.  Tuning Fork Right vibratory: present Left vibratory: present  Comments     Lab Results  Component Value Date   HGBA1C 6.1 (H) 02/21/2023   ADA Risk Categorization: High Risk  Patient has one or more of the following: Loss of protective sensation Absent pedal pulses Severe Foot deformity History of foot ulcer  Assessment: 1. Pain due to onychomycosis of toenails of both feet   2. Acquired hammertoes of both feet   3. Diabetic peripheral neuropathy associated with type 2 diabetes mellitus (HCC)   4. Encounter for diabetic foot exam (HCC)     Plan: -Consent given for treatment as described below: -Examined patient. -Diabetic foot examination performed today. -Continue diabetic foot care principles: inspect feet daily, monitor glucose as recommended by PCP and/or Endocrinologist, and follow prescribed diet per PCP, Endocrinologist and/or dietician. -Patient to continue soft, supportive shoe gear daily. -Mycotic toenails 1-5 bilaterally were debrided in length and girth with sterile nail nippers and dremel without incident. -Patient/POA to call should there be question/concern in the interim. Return in about 3 months (around 08/23/2023).  Freddie Breech, DPM      [place] LOCATION: [address] [place] [address]                   Office [phone]   [facility] LOCATION: [address] [place], [place] [address] Office [phone]

## 2023-06-05 DIAGNOSIS — E113493 Type 2 diabetes mellitus with severe nonproliferative diabetic retinopathy without macular edema, bilateral: Secondary | ICD-10-CM | POA: Diagnosis not present

## 2023-06-05 DIAGNOSIS — H401134 Primary open-angle glaucoma, bilateral, indeterminate stage: Secondary | ICD-10-CM | POA: Diagnosis not present

## 2023-06-13 DIAGNOSIS — I739 Peripheral vascular disease, unspecified: Secondary | ICD-10-CM | POA: Diagnosis not present

## 2023-06-13 DIAGNOSIS — E1151 Type 2 diabetes mellitus with diabetic peripheral angiopathy without gangrene: Secondary | ICD-10-CM | POA: Diagnosis not present

## 2023-06-13 DIAGNOSIS — I110 Hypertensive heart disease with heart failure: Secondary | ICD-10-CM | POA: Diagnosis not present

## 2023-06-13 DIAGNOSIS — D510 Vitamin B12 deficiency anemia due to intrinsic factor deficiency: Secondary | ICD-10-CM | POA: Diagnosis not present

## 2023-06-13 DIAGNOSIS — I480 Paroxysmal atrial fibrillation: Secondary | ICD-10-CM | POA: Diagnosis not present

## 2023-06-13 DIAGNOSIS — I2510 Atherosclerotic heart disease of native coronary artery without angina pectoris: Secondary | ICD-10-CM | POA: Diagnosis not present

## 2023-06-13 DIAGNOSIS — R82998 Other abnormal findings in urine: Secondary | ICD-10-CM | POA: Diagnosis not present

## 2023-06-13 DIAGNOSIS — I5021 Acute systolic (congestive) heart failure: Secondary | ICD-10-CM | POA: Diagnosis not present

## 2023-06-13 DIAGNOSIS — I214 Non-ST elevation (NSTEMI) myocardial infarction: Secondary | ICD-10-CM | POA: Diagnosis not present

## 2023-06-26 ENCOUNTER — Encounter: Payer: Self-pay | Admitting: Cardiology

## 2023-06-26 ENCOUNTER — Ambulatory Visit: Payer: Medicare Other | Attending: Cardiology | Admitting: Cardiology

## 2023-06-26 VITALS — BP 110/52 | HR 52 | Ht 67.0 in | Wt 127.2 lb

## 2023-06-26 DIAGNOSIS — I5020 Unspecified systolic (congestive) heart failure: Secondary | ICD-10-CM | POA: Insufficient documentation

## 2023-06-26 DIAGNOSIS — L570 Actinic keratosis: Secondary | ICD-10-CM | POA: Diagnosis not present

## 2023-06-26 DIAGNOSIS — D225 Melanocytic nevi of trunk: Secondary | ICD-10-CM | POA: Diagnosis not present

## 2023-06-26 DIAGNOSIS — I480 Paroxysmal atrial fibrillation: Secondary | ICD-10-CM | POA: Insufficient documentation

## 2023-06-26 DIAGNOSIS — L814 Other melanin hyperpigmentation: Secondary | ICD-10-CM | POA: Diagnosis not present

## 2023-06-26 DIAGNOSIS — D2262 Melanocytic nevi of left upper limb, including shoulder: Secondary | ICD-10-CM | POA: Diagnosis not present

## 2023-06-26 DIAGNOSIS — D1801 Hemangioma of skin and subcutaneous tissue: Secondary | ICD-10-CM | POA: Diagnosis not present

## 2023-06-26 DIAGNOSIS — I25810 Atherosclerosis of coronary artery bypass graft(s) without angina pectoris: Secondary | ICD-10-CM | POA: Diagnosis not present

## 2023-06-26 DIAGNOSIS — L821 Other seborrheic keratosis: Secondary | ICD-10-CM | POA: Diagnosis not present

## 2023-06-26 DIAGNOSIS — C434 Malignant melanoma of scalp and neck: Secondary | ICD-10-CM | POA: Diagnosis not present

## 2023-06-26 DIAGNOSIS — Z952 Presence of prosthetic heart valve: Secondary | ICD-10-CM | POA: Insufficient documentation

## 2023-06-26 DIAGNOSIS — D2261 Melanocytic nevi of right upper limb, including shoulder: Secondary | ICD-10-CM | POA: Diagnosis not present

## 2023-06-26 NOTE — Patient Instructions (Signed)

## 2023-06-26 NOTE — Progress Notes (Signed)
Cardiology Office Note:  .   Date:  06/26/2023  ID:  Nathan [name], DOB 01/21/1946, MRN [id number] PCP: Garlan Fillers, MD  [facility] Providers Cardiologist:  Donato Schultz, MD     History of Present Illness: Nathan [name] is a 77 y.o. male Discussed with the use of AI scribe   History of Present Illness   The patient is a 77 year old male with a history of coronary artery disease, aortic valve replacement with a bioprosthetic valve in 2022, and a prior GI bleed in 2010 due to a gastric ulcer. The patient also has diabetes, hyperlipidemia, obesity, and obstructive sleep apnea. The patient's LDL is currently 37 and Hemoglobin A1c is 5.8. The patient is on atorvastatin 80mg  daily for hyperlipidemia and Toprol 50mg  daily. The patient also takes fish oil and spironolactone 25/12.5mg  daily.  The patient has a history of a fall due to disequilibrium, resulting in broken ribs. The patient underwent CABG in March 2010. A repeat left heart catheterization showed patent LIMA to LAD, SVG to large OM, occluded SVG to PDA with collaterals treated medically. The patient had a 23mm Edwards bioprosthetic tissue valve placed in 2010. The most recent echocardiogram in August 2024 shows normal functioning of the bioprosthetic valve.  The patient reports no current issues with shortness of breath or chest pain. However, the patient has difficulty with mobility, particularly getting up from the floor and climbing stairs. The patient also reports a history of necrotizing fasciitis, which has led to a decision to discontinue Jardiance due to concerns about potential infection. The patient is not currently taking glipizide, and there is a question about whether this medication should be resumed given the patient's A1c of 5.8.  The patient has a history of atrial fibrillation, but due to a previous spontaneous bleed requiring three units of blood, the patient is not a candidate for long-term  anticoagulation. The patient's ejection fraction has improved from 20 to 40. The patient also reports a persistent issue with a right thigh hematoma.           Studies Reviewed: .        Results   LABS LDL: 37 (02/23/2023) Hemoglobin A1c: 5.8% Hemoglobin: 11.3 g/dL Creatinine: 1.7 mg/dL CBC: Resolution of anemia  RADIOLOGY Echocardiogram: Normal functioning of bioprosthetic valve (02/2023)  DIAGNOSTIC Left heart catheterization: Patent LIMA to LAD, SVG to large OM, occluded SVG to PDA with collaterals treated medically EKG: Atrial fibrillation Ejection fraction: Improved from 20% to 40% (03/2023)     Risk Assessment/Calculations:            Physical Exam:   VS:  BP (!) 110/52   Pulse (!) 52   Ht 5\' 7"  (1.702 m)   Wt 127 lb 3.2 oz (57.7 kg)   SpO2 96%   BMI 19.92 kg/m    Wt Readings from Last 3 Encounters:  06/26/23 127 lb 3.2 oz (57.7 kg)  04/20/23 138 lb 12.8 oz (63 kg)  04/20/23 136 lb 12.8 oz (62.1 kg)    GEN: Frail, well developed in no acute distress NECK: No JVD; No carotid bruits CARDIAC: Irreg, no murmurs, no rubs, no gallops RESPIRATORY:  Clear to auscultation without rales, wheezing or rhonchi  ABDOMEN: Soft, non-tender, non-distended EXTREMITIES:  No edema; No deformity   ASSESSMENT AND PLAN: .    Assessment and Plan    Coronary Artery Disease (CAD) Follow-up for CAD. History of CABG in March 2010. Repeat left heart catheterization showed patent LIMA  to LAD, SVG to large OM, occluded SVG to PDA with collaterals treated medically. Most recent echocardiogram in August 2024 shows normal functioning of bioprosthetic valve. No current symptoms of angina or dyspnea. - Continue Atorvastatin 80 mg daily, Toprol 50 mg daily, Spironolactone 12.5 mg daily, Fish oil - One year follow-up  Atrial Fibrillation Atrial fibrillation present on prior EKG. Not a candidate for long-term anticoagulation due to significant bleeding history, including a spontaneous  right adductor muscle bleed requiring three units of blood. Risk of anticoagulation outweighs the benefit due to high bleeding risk. Explained the stroke risk due to atrial fibrillation but emphasized the higher risk of major bleeding events with anticoagulation. - Hold off on anticoagulation due to high bleeding risk  Hyperlipidemia Hyperlipidemia with LDL currently at 37. Managed with Atorvastatin 80 mg daily. - Continue Atorvastatin 80 mg daily  Diabetes Mellitus Diabetes with current Hemoglobin A1c of 5.8. Not taking Jardiance due to fear of necrotizing fasciitis. Glipizide has been discontinued but was seen on a recent medication list. Advised to consult primary care physician (Dr. Jarold Motto) for diabetes management. - Consult Dr. Jarold Motto for diabetes management and medication adjustments  Obesity Obesity noted. No specific discussion on management during this visit.  Obstructive Sleep Apnea Obstructive sleep apnea being monitored by Dr. Jarold Motto. No specific discussion on management during this visit.  Gastrointestinal Bleed Gastrointestinal bleed in 2010 with gastric ulcer hemorrhage. No further bleeding since then.  Necrotizing Fasciitis History of necrotizing fasciitis. Avoids medications like Jardiance due to fear of recurrence.  General Health Maintenance Discussed the need for a handicap placard due to difficulty walking long distances. Advised to consult primary care physician for further assistance. - Consult primary care physician for handicap placard  Follow-up - Schedule one year follow-up.               Signed, Donato Schultz, MD

## 2023-07-03 ENCOUNTER — Other Ambulatory Visit: Payer: Self-pay | Admitting: Nurse Practitioner

## 2023-07-11 DIAGNOSIS — D485 Neoplasm of uncertain behavior of skin: Secondary | ICD-10-CM | POA: Diagnosis not present

## 2023-07-14 ENCOUNTER — Inpatient Hospital Stay (HOSPITAL_COMMUNITY)
Admission: EM | Admit: 2023-07-14 | Discharge: 2023-07-26 | DRG: 871 | Disposition: A | Discharge status: Skilled Nursing Facility | Payer: Medicare Other | Attending: Family Medicine | Admitting: Family Medicine

## 2023-07-14 ENCOUNTER — Encounter (HOSPITAL_COMMUNITY): Payer: Self-pay

## 2023-07-14 ENCOUNTER — Emergency Department (HOSPITAL_COMMUNITY): Payer: Medicare Other

## 2023-07-14 ENCOUNTER — Other Ambulatory Visit: Payer: Self-pay

## 2023-07-14 DIAGNOSIS — K8309 Other cholangitis: Secondary | ICD-10-CM | POA: Diagnosis present

## 2023-07-14 DIAGNOSIS — I214 Non-ST elevation (NSTEMI) myocardial infarction: Secondary | ICD-10-CM

## 2023-07-14 DIAGNOSIS — C439 Malignant melanoma of skin, unspecified: Secondary | ICD-10-CM | POA: Diagnosis present

## 2023-07-14 DIAGNOSIS — Z79899 Other long term (current) drug therapy: Secondary | ICD-10-CM

## 2023-07-14 DIAGNOSIS — I5023 Acute on chronic systolic (congestive) heart failure: Secondary | ICD-10-CM | POA: Diagnosis present

## 2023-07-14 DIAGNOSIS — N170 Acute kidney failure with tubular necrosis: Secondary | ICD-10-CM | POA: Diagnosis not present

## 2023-07-14 DIAGNOSIS — R7989 Other specified abnormal findings of blood chemistry: Secondary | ICD-10-CM | POA: Diagnosis present

## 2023-07-14 DIAGNOSIS — I959 Hypotension, unspecified: Secondary | ICD-10-CM | POA: Diagnosis not present

## 2023-07-14 DIAGNOSIS — Z953 Presence of xenogenic heart valve: Secondary | ICD-10-CM | POA: Diagnosis not present

## 2023-07-14 DIAGNOSIS — E1165 Type 2 diabetes mellitus with hyperglycemia: Secondary | ICD-10-CM | POA: Diagnosis present

## 2023-07-14 DIAGNOSIS — R1011 Right upper quadrant pain: Secondary | ICD-10-CM | POA: Diagnosis not present

## 2023-07-14 DIAGNOSIS — Z1152 Encounter for screening for COVID-19: Secondary | ICD-10-CM | POA: Diagnosis not present

## 2023-07-14 DIAGNOSIS — F419 Anxiety disorder, unspecified: Secondary | ICD-10-CM | POA: Diagnosis not present

## 2023-07-14 DIAGNOSIS — I4891 Unspecified atrial fibrillation: Secondary | ICD-10-CM

## 2023-07-14 DIAGNOSIS — I700 Atherosclerosis of aorta: Secondary | ICD-10-CM | POA: Diagnosis not present

## 2023-07-14 DIAGNOSIS — N1832 Chronic kidney disease, stage 3b: Secondary | ICD-10-CM | POA: Diagnosis present

## 2023-07-14 DIAGNOSIS — Z794 Long term (current) use of insulin: Secondary | ICD-10-CM

## 2023-07-14 DIAGNOSIS — D72828 Other elevated white blood cell count: Secondary | ICD-10-CM | POA: Diagnosis not present

## 2023-07-14 DIAGNOSIS — R2689 Other abnormalities of gait and mobility: Secondary | ICD-10-CM | POA: Diagnosis present

## 2023-07-14 DIAGNOSIS — R918 Other nonspecific abnormal finding of lung field: Secondary | ICD-10-CM | POA: Diagnosis not present

## 2023-07-14 DIAGNOSIS — I130 Hypertensive heart and chronic kidney disease with heart failure and stage 1 through stage 4 chronic kidney disease, or unspecified chronic kidney disease: Secondary | ICD-10-CM | POA: Diagnosis present

## 2023-07-14 DIAGNOSIS — M6281 Muscle weakness (generalized): Secondary | ICD-10-CM | POA: Diagnosis not present

## 2023-07-14 DIAGNOSIS — E785 Hyperlipidemia, unspecified: Secondary | ICD-10-CM | POA: Diagnosis not present

## 2023-07-14 DIAGNOSIS — E7800 Pure hypercholesterolemia, unspecified: Secondary | ICD-10-CM | POA: Diagnosis present

## 2023-07-14 DIAGNOSIS — F411 Generalized anxiety disorder: Secondary | ICD-10-CM | POA: Diagnosis not present

## 2023-07-14 DIAGNOSIS — E872 Acidosis, unspecified: Secondary | ICD-10-CM | POA: Diagnosis not present

## 2023-07-14 DIAGNOSIS — R109 Unspecified abdominal pain: Secondary | ICD-10-CM | POA: Diagnosis present

## 2023-07-14 DIAGNOSIS — A4151 Sepsis due to Escherichia coli [E. coli]: Principal | ICD-10-CM | POA: Diagnosis present

## 2023-07-14 DIAGNOSIS — K828 Other specified diseases of gallbladder: Secondary | ICD-10-CM | POA: Diagnosis not present

## 2023-07-14 DIAGNOSIS — E1169 Type 2 diabetes mellitus with other specified complication: Secondary | ICD-10-CM | POA: Diagnosis not present

## 2023-07-14 DIAGNOSIS — E1349 Other specified diabetes mellitus with other diabetic neurological complication: Secondary | ICD-10-CM | POA: Diagnosis not present

## 2023-07-14 DIAGNOSIS — I2510 Atherosclerotic heart disease of native coronary artery without angina pectoris: Secondary | ICD-10-CM | POA: Diagnosis present

## 2023-07-14 DIAGNOSIS — H401131 Primary open-angle glaucoma, bilateral, mild stage: Secondary | ICD-10-CM | POA: Diagnosis not present

## 2023-07-14 DIAGNOSIS — Z515 Encounter for palliative care: Secondary | ICD-10-CM

## 2023-07-14 DIAGNOSIS — R6520 Severe sepsis without septic shock: Secondary | ICD-10-CM | POA: Diagnosis not present

## 2023-07-14 DIAGNOSIS — I493 Ventricular premature depolarization: Secondary | ICD-10-CM | POA: Diagnosis not present

## 2023-07-14 DIAGNOSIS — R4182 Altered mental status, unspecified: Secondary | ICD-10-CM | POA: Diagnosis not present

## 2023-07-14 DIAGNOSIS — K810 Acute cholecystitis: Secondary | ICD-10-CM | POA: Diagnosis not present

## 2023-07-14 DIAGNOSIS — I10 Essential (primary) hypertension: Secondary | ICD-10-CM | POA: Diagnosis present

## 2023-07-14 DIAGNOSIS — N179 Acute kidney failure, unspecified: Secondary | ICD-10-CM | POA: Diagnosis not present

## 2023-07-14 DIAGNOSIS — I5022 Chronic systolic (congestive) heart failure: Secondary | ICD-10-CM | POA: Diagnosis not present

## 2023-07-14 DIAGNOSIS — E43 Unspecified severe protein-calorie malnutrition: Secondary | ICD-10-CM | POA: Diagnosis not present

## 2023-07-14 DIAGNOSIS — R944 Abnormal results of kidney function studies: Secondary | ICD-10-CM | POA: Diagnosis not present

## 2023-07-14 DIAGNOSIS — K8012 Calculus of gallbladder with acute and chronic cholecystitis without obstruction: Secondary | ICD-10-CM | POA: Diagnosis present

## 2023-07-14 DIAGNOSIS — I5020 Unspecified systolic (congestive) heart failure: Secondary | ICD-10-CM

## 2023-07-14 DIAGNOSIS — Z952 Presence of prosthetic heart valve: Secondary | ICD-10-CM

## 2023-07-14 DIAGNOSIS — K8010 Calculus of gallbladder with chronic cholecystitis without obstruction: Secondary | ICD-10-CM | POA: Diagnosis present

## 2023-07-14 DIAGNOSIS — Z7189 Other specified counseling: Secondary | ICD-10-CM | POA: Diagnosis not present

## 2023-07-14 DIAGNOSIS — N289 Disorder of kidney and ureter, unspecified: Secondary | ICD-10-CM | POA: Diagnosis present

## 2023-07-14 DIAGNOSIS — E1122 Type 2 diabetes mellitus with diabetic chronic kidney disease: Secondary | ICD-10-CM | POA: Diagnosis present

## 2023-07-14 DIAGNOSIS — E8721 Acute metabolic acidosis: Secondary | ICD-10-CM | POA: Diagnosis not present

## 2023-07-14 DIAGNOSIS — K219 Gastro-esophageal reflux disease without esophagitis: Secondary | ICD-10-CM | POA: Diagnosis not present

## 2023-07-14 DIAGNOSIS — G9341 Metabolic encephalopathy: Secondary | ICD-10-CM | POA: Diagnosis not present

## 2023-07-14 DIAGNOSIS — R41841 Cognitive communication deficit: Secondary | ICD-10-CM | POA: Diagnosis not present

## 2023-07-14 DIAGNOSIS — Z951 Presence of aortocoronary bypass graft: Secondary | ICD-10-CM | POA: Diagnosis not present

## 2023-07-14 DIAGNOSIS — I252 Old myocardial infarction: Secondary | ICD-10-CM

## 2023-07-14 DIAGNOSIS — K5730 Diverticulosis of large intestine without perforation or abscess without bleeding: Secondary | ICD-10-CM | POA: Diagnosis not present

## 2023-07-14 DIAGNOSIS — I4821 Permanent atrial fibrillation: Secondary | ICD-10-CM | POA: Diagnosis present

## 2023-07-14 DIAGNOSIS — I129 Hypertensive chronic kidney disease with stage 1 through stage 4 chronic kidney disease, or unspecified chronic kidney disease: Secondary | ICD-10-CM | POA: Diagnosis not present

## 2023-07-14 DIAGNOSIS — R945 Abnormal results of liver function studies: Secondary | ICD-10-CM | POA: Diagnosis not present

## 2023-07-14 DIAGNOSIS — D649 Anemia, unspecified: Secondary | ICD-10-CM | POA: Diagnosis not present

## 2023-07-14 DIAGNOSIS — K838 Other specified diseases of biliary tract: Secondary | ICD-10-CM | POA: Diagnosis not present

## 2023-07-14 DIAGNOSIS — M1380 Other specified arthritis, unspecified site: Secondary | ICD-10-CM | POA: Diagnosis not present

## 2023-07-14 DIAGNOSIS — R471 Dysarthria and anarthria: Secondary | ICD-10-CM | POA: Diagnosis present

## 2023-07-14 DIAGNOSIS — E119 Type 2 diabetes mellitus without complications: Secondary | ICD-10-CM | POA: Diagnosis not present

## 2023-07-14 DIAGNOSIS — I5021 Acute systolic (congestive) heart failure: Secondary | ICD-10-CM | POA: Diagnosis not present

## 2023-07-14 DIAGNOSIS — E113393 Type 2 diabetes mellitus with moderate nonproliferative diabetic retinopathy without macular edema, bilateral: Secondary | ICD-10-CM | POA: Diagnosis not present

## 2023-07-14 DIAGNOSIS — R000 Tachycardia, unspecified: Secondary | ICD-10-CM | POA: Diagnosis not present

## 2023-07-14 DIAGNOSIS — Z7982 Long term (current) use of aspirin: Secondary | ICD-10-CM

## 2023-07-14 DIAGNOSIS — N183 Chronic kidney disease, stage 3 unspecified: Secondary | ICD-10-CM | POA: Diagnosis present

## 2023-07-14 DIAGNOSIS — Z9181 History of falling: Secondary | ICD-10-CM | POA: Diagnosis not present

## 2023-07-14 DIAGNOSIS — G4733 Obstructive sleep apnea (adult) (pediatric): Secondary | ICD-10-CM | POA: Diagnosis present

## 2023-07-14 DIAGNOSIS — Z7401 Bed confinement status: Secondary | ICD-10-CM | POA: Diagnosis not present

## 2023-07-14 DIAGNOSIS — K8020 Calculus of gallbladder without cholecystitis without obstruction: Secondary | ICD-10-CM | POA: Diagnosis not present

## 2023-07-14 DIAGNOSIS — Z8739 Personal history of other diseases of the musculoskeletal system and connective tissue: Secondary | ICD-10-CM

## 2023-07-14 DIAGNOSIS — R6521 Severe sepsis with septic shock: Principal | ICD-10-CM | POA: Diagnosis present

## 2023-07-14 DIAGNOSIS — Z66 Do not resuscitate: Secondary | ICD-10-CM | POA: Diagnosis not present

## 2023-07-14 DIAGNOSIS — A419 Sepsis, unspecified organism: Secondary | ICD-10-CM | POA: Diagnosis not present

## 2023-07-14 DIAGNOSIS — I119 Hypertensive heart disease without heart failure: Secondary | ICD-10-CM | POA: Diagnosis not present

## 2023-07-14 DIAGNOSIS — R932 Abnormal findings on diagnostic imaging of liver and biliary tract: Secondary | ICD-10-CM | POA: Diagnosis not present

## 2023-07-14 DIAGNOSIS — L309 Dermatitis, unspecified: Secondary | ICD-10-CM | POA: Diagnosis not present

## 2023-07-14 DIAGNOSIS — R531 Weakness: Secondary | ICD-10-CM | POA: Diagnosis not present

## 2023-07-14 DIAGNOSIS — J9601 Acute respiratory failure with hypoxia: Secondary | ICD-10-CM | POA: Diagnosis not present

## 2023-07-14 DIAGNOSIS — R262 Difficulty in walking, not elsewhere classified: Secondary | ICD-10-CM | POA: Diagnosis not present

## 2023-07-14 DIAGNOSIS — I350 Nonrheumatic aortic (valve) stenosis: Secondary | ICD-10-CM | POA: Diagnosis present

## 2023-07-14 DIAGNOSIS — N1831 Chronic kidney disease, stage 3a: Secondary | ICD-10-CM | POA: Diagnosis present

## 2023-07-14 DIAGNOSIS — Z7984 Long term (current) use of oral hypoglycemic drugs: Secondary | ICD-10-CM

## 2023-07-14 DIAGNOSIS — Z8249 Family history of ischemic heart disease and other diseases of the circulatory system: Secondary | ICD-10-CM

## 2023-07-14 LAB — URINALYSIS, W/ REFLEX TO CULTURE (INFECTION SUSPECTED)
Bacteria, UA: NONE SEEN
Bilirubin Urine: NEGATIVE
Glucose, UA: NEGATIVE mg/dL
Hgb urine dipstick: NEGATIVE
Ketones, ur: NEGATIVE mg/dL
Leukocytes,Ua: NEGATIVE
Nitrite: NEGATIVE
Protein, ur: NEGATIVE mg/dL
Specific Gravity, Urine: 1.025 (ref 1.005–1.030)
pH: 5 (ref 5.0–8.0)

## 2023-07-14 LAB — CBC WITH DIFFERENTIAL/PLATELET
Abs Immature Granulocytes: 0.2 10*3/uL — ABNORMAL HIGH (ref 0.00–0.07)
Basophils Absolute: 0 10*3/uL (ref 0.0–0.1)
Basophils Relative: 0 %
Eosinophils Absolute: 0 10*3/uL (ref 0.0–0.5)
Eosinophils Relative: 0 %
HCT: 29.2 % — ABNORMAL LOW (ref 39.0–52.0)
Hemoglobin: 10.1 g/dL — ABNORMAL LOW (ref 13.0–17.0)
Immature Granulocytes: 1 %
Lymphocytes Relative: 3 %
Lymphs Abs: 0.8 10*3/uL (ref 0.7–4.0)
MCH: 29.9 pg (ref 26.0–34.0)
MCHC: 34.6 g/dL (ref 30.0–36.0)
MCV: 86.4 fL (ref 80.0–100.0)
Monocytes Absolute: 1.6 10*3/uL — ABNORMAL HIGH (ref 0.1–1.0)
Monocytes Relative: 7 %
Neutro Abs: 21.7 10*3/uL — ABNORMAL HIGH (ref 1.7–7.7)
Neutrophils Relative %: 89 %
Platelets: 263 10*3/uL (ref 150–400)
RBC: 3.38 MIL/uL — ABNORMAL LOW (ref 4.22–5.81)
RDW: 17.2 % — ABNORMAL HIGH (ref 11.5–15.5)
WBC: 24.3 10*3/uL — ABNORMAL HIGH (ref 4.0–10.5)
nRBC: 0 % (ref 0.0–0.2)

## 2023-07-14 LAB — I-STAT CG4 LACTIC ACID, ED
Lactic Acid, Venous: 5.7 mmol/L (ref 0.5–1.9)
Lactic Acid, Venous: 5.6 mmol/L (ref 0.5–1.9)

## 2023-07-14 LAB — COMPREHENSIVE METABOLIC PANEL
ALT: 192 U/L — ABNORMAL HIGH (ref 0–44)
AST: 221 U/L — ABNORMAL HIGH (ref 15–41)
Albumin: 3.2 g/dL — ABNORMAL LOW (ref 3.5–5.0)
Alkaline Phosphatase: 178 U/L — ABNORMAL HIGH (ref 38–126)
Anion gap: 18 — ABNORMAL HIGH (ref 5–15)
BUN: 48 mg/dL — ABNORMAL HIGH (ref 8–23)
CO2: 16 mmol/L — ABNORMAL LOW (ref 22–32)
Calcium: 9.6 mg/dL (ref 8.9–10.3)
Chloride: 100 mmol/L (ref 98–111)
Creatinine, Ser: 1.96 mg/dL — ABNORMAL HIGH (ref 0.61–1.24)
GFR, Estimated: 34 mL/min — ABNORMAL LOW (ref >60)
Glucose, Bld: 169 mg/dL — ABNORMAL HIGH (ref 70–99)
Potassium: 4.5 mmol/L (ref 3.5–5.1)
Sodium: 134 mmol/L — ABNORMAL LOW (ref 135–145)
Total Bilirubin: 2.5 mg/dL — ABNORMAL HIGH (ref 0.0–1.2)
Total Protein: 6.7 g/dL (ref 6.5–8.1)

## 2023-07-14 LAB — RESP PANEL BY RT-PCR (RSV, FLU A&B, COVID)  RVPGX2
Influenza A by PCR: NEGATIVE
Influenza B by PCR: NEGATIVE
Resp Syncytial Virus by PCR: NEGATIVE
SARS Coronavirus 2 by RT PCR: NEGATIVE

## 2023-07-14 LAB — TROPONIN I (HIGH SENSITIVITY)
Troponin I (High Sensitivity): 18 ng/L — ABNORMAL HIGH (ref <18)
Troponin I (High Sensitivity): 16 ng/L (ref <18)

## 2023-07-14 LAB — LIPASE, BLOOD: Lipase: 43 U/L (ref 11–51)

## 2023-07-14 LAB — BRAIN NATRIURETIC PEPTIDE: B Natriuretic Peptide: 2349.7 pg/mL — ABNORMAL HIGH (ref 0.0–100.0)

## 2023-07-14 MED ORDER — LACTATED RINGERS IV BOLUS
1000.0000 mL | Freq: Once | INTRAVENOUS | Status: AC
  Administered 2023-07-14: 1000 mL via INTRAVENOUS

## 2023-07-14 MED ORDER — IOHEXOL 300 MG/ML  SOLN
75.0000 mL | Freq: Once | INTRAMUSCULAR | Status: AC | PRN
  Administered 2023-07-14: 75 mL via INTRAVENOUS

## 2023-07-14 MED ORDER — SALINE SPRAY 0.65 % NA SOLN: 1.0000 | Freq: Four times a day (QID) | NASAL | Status: DC | PRN

## 2023-07-14 MED ORDER — MAGIC MOUTHWASH: 15.0000 mL | Freq: Four times a day (QID) | ORAL | Status: DC | PRN

## 2023-07-14 MED ORDER — ACETAMINOPHEN 325 MG PO TABS
325.0000 mg | ORAL_TABLET | Freq: Four times a day (QID) | ORAL | Status: DC | PRN
  Administered 2023-07-22: 650 mg via ORAL
  Filled 2023-07-14 (×3): qty 2

## 2023-07-14 MED ORDER — ALUM & MAG HYDROXIDE-SIMETH 200-200-20 MG/5ML PO SUSP: 30.0000 mL | Freq: Four times a day (QID) | ORAL | Status: DC | PRN

## 2023-07-14 MED ORDER — ACETAMINOPHEN 650 MG RE SUPP: 650.0000 mg | Freq: Four times a day (QID) | RECTAL | Status: DC | PRN

## 2023-07-14 MED ORDER — TRAMADOL HCL 50 MG PO TABS: 50.0000 mg | ORAL_TABLET | Freq: Two times a day (BID) | ORAL | Status: DC | PRN

## 2023-07-14 MED ORDER — METHOCARBAMOL 1000 MG/10ML IJ SOLN: 1000.0000 mg | Freq: Four times a day (QID) | INTRAMUSCULAR | Status: DC | PRN

## 2023-07-14 MED ORDER — SIMETHICONE 40 MG/0.6ML PO SUSP: 80.0000 mg | Freq: Four times a day (QID) | ORAL | Status: DC | PRN

## 2023-07-14 MED ORDER — VANCOMYCIN HCL IN DEXTROSE 1-5 GM/200ML-% IV SOLN
1000.0000 mg | INTRAVENOUS | Status: AC
  Administered 2023-07-14: 1000 mg via INTRAVENOUS
  Filled 2023-07-14: qty 200

## 2023-07-14 MED ORDER — CALCIUM POLYCARBOPHIL 625 MG PO TABS
625.0000 mg | ORAL_TABLET | Freq: Two times a day (BID) | ORAL | Status: DC
  Administered 2023-07-15 – 2023-07-26 (×22): 625 mg via ORAL
  Filled 2023-07-14 (×23): qty 1

## 2023-07-14 MED ORDER — PHENOL 1.4 % MT LIQD: 2.0000 | OROMUCOSAL | Status: DC | PRN

## 2023-07-14 MED ORDER — NAPHAZOLINE-GLYCERIN 0.012-0.25 % OP SOLN: 1.0000 [drp] | Freq: Four times a day (QID) | OPHTHALMIC | Status: DC | PRN

## 2023-07-14 MED ORDER — MENTHOL 3 MG MT LOZG: 1.0000 | LOZENGE | OROMUCOSAL | Status: DC | PRN

## 2023-07-14 MED ORDER — CEFEPIME HCL 2 G IV SOLR
2.0000 g | Freq: Once | INTRAVENOUS | Status: AC
  Administered 2023-07-14: 2 g via INTRAVENOUS
  Filled 2023-07-14: qty 12.5

## 2023-07-14 MED ORDER — ONDANSETRON HCL 4 MG/2ML IJ SOLN: 4.0000 mg | Freq: Four times a day (QID) | INTRAMUSCULAR | Status: DC | PRN

## 2023-07-14 MED ORDER — PIPERACILLIN-TAZOBACTAM 3.375 G IVPB
3.3750 g | Freq: Three times a day (TID) | INTRAVENOUS | Status: DC
  Administered 2023-07-15: 3.375 g via INTRAVENOUS
  Filled 2023-07-14: qty 50

## 2023-07-14 MED ORDER — HYDROMORPHONE HCL 1 MG/ML IJ SOLN
0.5000 mg | INTRAMUSCULAR | Status: DC | PRN
  Administered 2023-07-21 – 2023-07-22 (×2): 1 mg via INTRAVENOUS
  Filled 2023-07-14 (×2): qty 1

## 2023-07-14 MED ORDER — METRONIDAZOLE 500 MG/100ML IV SOLN
500.0000 mg | Freq: Once | INTRAVENOUS | Status: AC
  Administered 2023-07-14: 500 mg via INTRAVENOUS
  Filled 2023-07-14: qty 100

## 2023-07-14 MED ORDER — LACTATED RINGERS IV BOLUS
1000.0000 mL | Freq: Once | INTRAVENOUS | Status: AC
Start: 2023-07-14 — End: 2023-07-14
  Administered 2023-07-14: 1000 mL via INTRAVENOUS

## 2023-07-14 MED ORDER — HYDROMORPHONE HCL 1 MG/ML IJ SOLN: 0.5000 mg | INTRAMUSCULAR | Status: DC | PRN

## 2023-07-14 MED ORDER — PROCHLORPERAZINE EDISYLATE 10 MG/2ML IJ SOLN: 5.0000 mg | INTRAMUSCULAR | Status: DC | PRN

## 2023-07-14 MED ORDER — BISACODYL 10 MG RE SUPP
10.0000 mg | Freq: Every day | RECTAL | Status: DC
  Administered 2023-07-15: 10 mg via RECTAL
  Filled 2023-07-14 (×2): qty 1

## 2023-07-14 MED ORDER — METHOCARBAMOL 500 MG PO TABS: 1000.0000 mg | ORAL_TABLET | Freq: Four times a day (QID) | ORAL | Status: DC | PRN

## 2023-07-14 MED ORDER — LACTATED RINGERS IV BOLUS: 1000.0000 mL | Freq: Three times a day (TID) | INTRAVENOUS | Status: DC | PRN

## 2023-07-14 NOTE — Progress Notes (Signed)
 ED Pharmacy Antibiotic Sign Off An antibiotic consult was received from an ED provider for Vancomycin  per pharmacy dosing for sepsis. A chart review was completed to assess appropriateness.   The following one time order(s) were placed:  Vancomycin  1g IV  Further antibiotic and/or antibiotic pharmacy consults should be ordered by the admitting provider if indicated.   Thank you for allowing pharmacy to be a part of this patient's care.   Haylee Mcanany M, [facility]  Clinical Pharmacist 07/14/23 7:37 PM

## 2023-07-14 NOTE — ED Notes (Signed)
 Contacted pt's wife, Gavin Pound, and updated her about pt's plan care according to MD.

## 2023-07-14 NOTE — Progress Notes (Signed)
 Pt being followed by [facility] for Sepsis protocol.

## 2023-07-14 NOTE — ED Notes (Signed)
Xray in the room.

## 2023-07-14 NOTE — ED Triage Notes (Signed)
 Day or 2 of general weakness. Went to [facility] urgent care 68/42 BP. Afib with infrequent PVCs. 82/46 with EMS. 24 G R wrist. CBG 94.

## 2023-07-14 NOTE — ED Provider Notes (Signed)
 [place] EMERGENCY DEPARTMENT AT [facility] Provider Note   CSN: [id number] Arrival date & time: 07/14/23  1706     History Chief Complaint  Patient presents with   Weakness   Hypotension    HPI Nathan [name] is a 78 y.o. male presenting for weakness fatigue myalgia altered mental status.  Brought in from urgent care clinic for hypotension 60s over 40s.  Patient is a very poor historian cannot recall much of his clinical history but just due to his cough and feeling well.  Clinical review is a history of cholecystitis requiring PERC drain  Patient's recorded medical, surgical, social, medication list and allergies were reviewed in the Snapshot window as part of the initial history.   Review of Systems   Review of Systems  Constitutional:  Negative for chills and fever.  HENT:  Negative for ear pain and sore throat.   Eyes:  Negative for pain and visual disturbance.  Respiratory:  Negative for cough and shortness of breath.   Cardiovascular:  Negative for chest pain and palpitations.  Gastrointestinal:  Positive for abdominal pain and nausea. Negative for abdominal distention and vomiting.  Genitourinary:  Negative for dysuria and hematuria.  Musculoskeletal:  Negative for arthralgias and back pain.  Skin:  Negative for color change and rash.  Neurological:  Negative for seizures and syncope.  All other systems reviewed and are negative.   Physical Exam Updated Vital Signs BP 99/81   Pulse 74   Temp 98.2 F (36.8 C) (Oral)   Resp (!) 23   Ht 5' 7 (1.702 m)   Wt 54.4 kg   SpO2 90%   BMI 18.79 kg/m  Physical Exam Vitals and nursing note reviewed.  Constitutional:      General: He is not in acute distress.    Appearance: He is well-developed.  HENT:     Head: Normocephalic and atraumatic.  Eyes:     Conjunctiva/sclera: Conjunctivae normal.  Cardiovascular:     Rate and Rhythm: Normal rate and regular rhythm.     Heart sounds: No murmur  heard. Pulmonary:     Effort: Pulmonary effort is normal. No respiratory distress.     Breath sounds: Normal breath sounds.  Abdominal:     Palpations: Abdomen is soft.     Tenderness: There is abdominal tenderness.  Musculoskeletal:        General: No swelling.     Cervical back: Neck supple.  Skin:    General: Skin is warm and dry.     Capillary Refill: Capillary refill takes less than 2 seconds.  Neurological:     Mental Status: He is alert.  Psychiatric:        Mood and Affect: Mood normal.      ED Course/ Medical Decision Making/ A&P Clinical Course as of 07/15/23 0019  [place] Jul 14, 2023  2334 [facility] cardiology Dr. Audelia.  He recommended against diuresis.  Stated the clinical presentation is more consistent with sepsis not cardiogenic shock based on presented information and recommended treatment for sepsis.  Cardiology to see in AM [CC]    Clinical Course User Index [CC] Jerral Meth, MD    Procedures .Critical Care  Performed by: Jerral Meth, MD Authorized by: Jerral Meth, MD   Critical care provider statement:    Critical care time (minutes):  95   Critical care was time spent personally by me on the following activities:  Development of treatment plan with patient or surrogate, discussions with consultants,  evaluation of patient's response to treatment, examination of patient, ordering and review of laboratory studies, ordering and review of radiographic studies, ordering and performing treatments and interventions, pulse oximetry, re-evaluation of patient's condition and review of old charts   Care discussed with: admitting provider      Medical Decision Making:   [name] is a 78 y.o. male who presented to the ED today with multiple symptoms detailed above.    Patient placed on continuous vitals and telemetry monitoring while in ED which was reviewed periodically.  Complete initial physical exam performed, notably the patient  was  ill-appearing. During this initial exam, patient met criteria for activation of code sepsis due to presence of the following SIRS criteria as well as suspected infectious etiology:tachypnea,tachycardia,fever,triage CBC with leukocytosis. Reviewed and confirmed nursing documentation for past medical history, family history, social history.    Initial Assessment:   With the patient's presentation of signs and symptoms of sepsis, most likely diagnosis is bacteremia secondary to underlying infection.  Considerations for source were initiated including:Urinary tract infections, abdominal infections such as cholecystitis/cholangitis/appendicitis, pulmonary etiology, bacteremia, skin etiology such as cellulitis or fasciitis, neurologic etiology such as meningitis or encephalitis.  This is most consistent with an acute life/limb threatening illness complicated by underlying chronic conditions.  Initial Plan:  Activated hospital protocol code sepsis including blood cultures, lactic acid screening, and further diagnostic care and management. Therapeutically, resuscitation fluids were considered. Patient has no contraindication to fluid resuscitation and therefore 30 cc of IV fluids per kilogram were administered Therapeutically, antibiotics were administered on a broad-spectrum nature. Undifferentiated source: Vancomycin  and cefepime  were administered to cover gram-positive and gram-negative high risk infections Potential anaerobic infection: Metronidazole  was utilized to cover potential anaerobic infections Screening labs including CBC and Metabolic panel to evaluate for infectious or metabolic etiology of disease.  Urinalysis with reflex culture ordered to evaluate for UTI or relevant urologic/nephrologic pathology.  CT abdomen pelvis to identify intra-abdominal pathology EKG to evaluate for cardiac pathology Objective evaluation as below reviewed   Initial Study Results:   Laboratory  All laboratory  results reviewed without evidence of clinically relevant pathology.   Exceptions include: LFT abnormalities  EKG EKG was reviewed independently. Rate, rhythm, axis, intervals all examined and without medically relevant abnormality. ST segments without concerns for elevations.    Radiology:  All images reviewed independently. Agree with radiology report at this time.   CT ABDOMEN PELVIS W CONTRAST Result Date: 07/14/2023 CLINICAL DATA:  Weakness and possible sepsis EXAM: CT ABDOMEN AND PELVIS WITH CONTRAST TECHNIQUE: Multidetector CT imaging of the abdomen and pelvis was performed using the standard protocol following bolus administration of intravenous contrast. RADIATION DOSE REDUCTION: This exam was performed according to the departmental dose-optimization program which includes automated exposure control, adjustment of the mA and/or kV according to patient size and/or use of iterative reconstruction technique. CONTRAST:  75mL OMNIPAQUE  IOHEXOL  300 MG/ML  SOLN COMPARISON:  03/01/2023 FINDINGS: Lower chest: Mild interstitial edema is noted. No sizable effusion is seen. Hepatobiliary: Liver is within normal limits. Gallbladder is well distended with dependent calculus. Significant wall thickening and edema is noted consistent with acute cholecystitis till proven otherwise. Ultrasound may be helpful for further evaluation. The common bile duct is prominent measuring up to 11 mm greater than that expected for the patient's given age. No definitive distal CBD calculus is seen. Pancreas: Unremarkable. No pancreatic ductal dilatation or surrounding inflammatory changes. Spleen: Calcified granulomas are noted throughout the spleen. Adrenals/Urinary Tract: Adrenal glands are within  normal limits. Bilateral renal cysts are noted which appear simple in nature and stable from previous exam. No follow-up is recommended. No renal calculi or obstructive changes are seen. The bladder is decompressed. Stomach/Bowel:  Scattered diverticular change of the colon is noted. No evidence of diverticulitis is seen. The appendix appears within normal limits. Small bowel and stomach are unremarkable. Vascular/Lymphatic: Aortic atherosclerosis. No enlarged abdominal or pelvic lymph nodes. Reproductive: Prostate is unremarkable. Other: Mild free fluid is noted within the abdomen and pelvis. Musculoskeletal: Degenerative changes of lumbar spine are noted. IMPRESSION: Inflammatory changes involving the gallbladder consistent with acute cholecystitis. Ultrasound may be helpful for further evaluation as necessary. Common bile duct dilatation is seen although no discrete distal calculus is noted. Diverticulosis without diverticulitis. Mild free fluid in the abdomen. Electronically Signed   By: Oneil Devonshire M.D.   On: 07/14/2023 21:57   DG Chest Portable 1 View Result Date: 07/14/2023 CLINICAL DATA:  Generalized weakness. EXAM: PORTABLE CHEST 1 VIEW COMPARISON:  X-ray 02/28/2023.  CT 03/01/2023.  Older exams as well. FINDINGS: Underinflation. Enlarged cardiac silhouette with calcified aorta. Sternal wires. Prosthetic valves. Slight interstitial changes identified with some subtle opacity left lung base. No pneumothorax or effusion. Old left-sided rib fractures. IMPRESSION: Underinflation. Postop chest. Enlarged cardiopericardial silhouette. Slight interstitial changes. Mild opacity left lung base. Recommend follow-up. Electronically Signed   By: Ranell Bring M.D.   On: 07/14/2023 19:39    Final Assessment and Plan:   History of present illness and physical exam findings are most consistent with acute sepsis of uncertain pathology.  I am leaning towards cholecystitis.  Consulted general surgery who recommended HIDA scan overnight for further differentiation of whether cholecystitis is the cause of the patient's presentation.  They stated they would evaluate in person in the morning  Consulted hospitalist who stated the patient would need  critical care admission as they did not feel comfortable with this level of care.  They also recommended cardiology consultation to rule out cardiogenic shock.  Cardiology recommended continued sepsis treatment as cardiac biomarkers are likely secondary to severe nature of patient's presentation and current presentation does not appear consistent with cardiogenic shock. Consulted critical care stated they would admit the patient for further care and management.    Disposition:   Based on the above findings, I believe this patient is stable for admission.    Patient/family educated about specific findings on our evaluation and explained exact reasons for admission.  Patient/family educated about clinical situation and time was allowed to answer questions.   Admission team communicated with and agreed with need for admission. Patient admitted. Patient  ready to move at this time.     Medications Ordered in ED Medications  lactated ringers  bolus 1,000 mL (has no administration in time range)  piperacillin -tazobactam (ZOSYN ) IVPB 3.375 g (has no administration in time range)  traMADol  (ULTRAM ) tablet 50-100 mg (has no administration in time range)  methocarbamol  (ROBAXIN ) injection 1,000 mg (has no administration in time range)  methocarbamol  (ROBAXIN ) tablet 1,000 mg (has no administration in time range)  acetaminophen  (TYLENOL ) tablet 325-650 mg (has no administration in time range)  acetaminophen  (TYLENOL ) suppository 650 mg (has no administration in time range)  ondansetron  (ZOFRAN ) injection 4 mg (has no administration in time range)  prochlorperazine  (COMPAZINE ) injection 5-10 mg (has no administration in time range)  phenol (CHLORASEPTIC) mouth spray 2 spray (has no administration in time range)  menthol -cetylpyridinium (CEPACOL) lozenge 3 mg (has no administration in time range)  magic  mouthwash (has no administration in time range)  alum & mag hydroxide-simeth (MAALOX/MYLANTA)  200-200-20 MG/5ML suspension 30 mL (has no administration in time range)  simethicone  (MYLICON) 40 MG/0.6ML suspension 80 mg (has no administration in time range)  bisacodyl  (DULCOLAX) suppository 10 mg (has no administration in time range)  polycarbophil (FIBERCON) tablet 625 mg (has no administration in time range)  naphazoline-glycerin  (CLEAR EYES REDNESS) ophth solution 1-2 drop (has no administration in time range)  sodium chloride  (OCEAN) 0.65 % nasal spray 1-2 spray (has no administration in time range)  HYDROmorphone  (DILAUDID ) injection 0.5-1 mg (has no administration in time range)  0.9 %  sodium chloride  infusion (has no administration in time range)  norepinephrine  (LEVOPHED ) 4mg  in (0.016 mg/mL) premix infusion (has no administration in time range)  lactated ringers  bolus 1,000 mL (0 mLs Intravenous Stopped 07/14/23 1941)  ceFEPIme  (MAXIPIME ) 2 g in sodium chloride  0.9 % 100 mL IVPB (0 g Intravenous Stopped 07/14/23 2013)  lactated ringers  bolus 1,000 mL (0 mLs Intravenous Stopped 07/14/23 2138)  vancomycin  (VANCOCIN ) IVPB 1000 mg/200 mL premix (0 mg Intravenous Stopped 07/14/23 2153)  iohexol  (OMNIPAQUE ) 300 MG/ML solution 75 mL (75 mLs Intravenous Contrast Given 07/14/23 2019)  metroNIDAZOLE  (FLAGYL ) IVPB 500 mg (0 mg Intravenous Stopped 07/14/23 2336)    Clinical Impression:  1. Sepsis with acute organ dysfunction and septic shock, due to unspecified organism, unspecified organ dysfunction type (HCC)      Data Unavailable   Final Clinical Impression(s) / ED Diagnoses Final diagnoses:  Sepsis with acute organ dysfunction and septic shock, due to unspecified organism, unspecified organ dysfunction type [facility])    Rx / DC Orders ED Discharge Orders     None         Jerral Meth, MD 07/15/23 [phone]

## 2023-07-14 NOTE — ED Notes (Signed)
 Patient transported to CT

## 2023-07-14 NOTE — Consult Note (Addendum)
 Tru Rana  [birth year] [id number]  CARE TEAM:  PCP: Yolande Toribio MATSU, MD  Outpatient Care Team: Patient Care Team: Yolande Toribio MATSU, MD as PCP - General (Internal Medicine) Jeffrie Oneil BROCKS, MD as PCP - Cardiology (Cardiology) Rubin Calamity, MD as Consulting Physician (General Surgery) Pyrtle, Gordy HERO, MD as Consulting Physician (Gastroenterology)  Inpatient Treatment Team: Treatment Team:  Jerral Meth, MD Lenoard Dotty LABOR, NT Diggs, Myra J, RN Fevrier, Lonni BIRCH, RN Ccs, Md, MD   This patient is a 78 y.o.male who presents today for surgical evaluation at the request of Dr Jerral.   Chief complaint / Reason for evaluation: Recurrent abdominal pain and probable sepsis.  Possible cholecystitis  78 year old male with many medical problems.  History of coronary disease.  Prior CABG bypass surgery 2010.  Angioplasty 2017.  Aortic valve replacement bioprosthetic valve in 2022.  Atrial fibrillation.  I think he is to be anticoagulated but then had a bad upper GI bleed due to a gastric ulcer.  Also had a prior right thigh hematoma.  No longer on full anticoagulation.  Fairly controlled diabetes that seems to be better controlled more recently.  Obstructive sleep apnea - not on CPAP.  Disequilibrium with history of falls.  Had a history of necrotizing fasciitis.  Patient was admitted in mid August with chest pain and concerns of myocardial infarction with some heart failure.  Concern for pneumonia on antibiotics.  She then had abdominal pain which showed question of cholecystitis that was confirmed on HIDA scan.  Given his poor medical state was felt not to be a good operative risk so he had a percutaneous cholecystostomy tube placed.  This encephalopathy improved.  Had to have the drain exchanged in late August.  Followed up with Dr. Rubin with our group in October 2024 to consider interval cholecystectomy.  Drain had dislodged and was removed.  Family wish to hold  off on any surgery.  He was on IV antibiotics for a while followed by infectious disease but eventually improved on Augmentin .  His ejection fraction improved to around 40%.  Followed by Dr. Jeffrie with cardiology.  CCS/general surgery not aware of readmissions since October.  Patient comes in with 3 days of feeling poorly.  Does have some chronic fatigue related to his health issues and medications.  Complained of worsening back pain and urinary frequency.  Some confusion.  Worried about another UTI.  Found to be hypotensive when he went to urgent care.  Recommend go to ER so they came to [facility].  Increased creatinine concerning.  Elevated white count.  Elevated LFTs.  CT scan raises concern of recurrent cholecystitis.  Surgical consultation requested.   Assessment  Jeromiah Ohalloran  78 y.o. male       Problem List:  Active Problems:   Type 2 diabetes mellitus with hyperlipidemia (HCC)   Anxiety state   Balance problem   Abdominal pain   Tachycardia   Elevated LFTs   S/P AVR   Acute on chronic systolic CHF (congestive heart failure) (HCC)   Acute kidney injury on CKD stage 3a, GFR 45-59 ml/min (HCC)   CKD stage 3a, GFR 45-59 ml/min (HCC)   FTT with abdominal complaints and the CAT scan concerning for recurrent or persistent cholecystitis.      Plan:  Medical [facility] admission with surgery consultation.  IV antibiotics.  Would favor piperacillin /tazobactam for now.  CAT scan does show a very thickened gallbladder that is concerning but with his history  of prior cholecystitis and unreliable history and physical exam, would lean toward HIDA scan to call the question.    Dilated bile ducts raises a concern for choledocholithiasis. Suspect Mirrzzi syndrone over CBD stones.  May need MRCP as well  If evidence of cholecystitis/cystic duct obstruction, they would need palliation of his cholecystitis.  Most likely would do percutaneous cholecystostomy tube placement.  I think he would be  high risk to try and do surgery at this time with shock with a very thickened on CAT scan & prior attack concerning for OR risk of CBD injury/subtotal lap chole very high  Probably needs IV fluids for dehydration and AKI but with elevated BNP and history of heart failure, this may be a little more complicated.    Patient and family had declined interval cholecystectomy back in October but he is got probable recurrence.  Would need to know if family would consider surgery again or they going to refuse again.  No need for emergency surgery tonight though.  Most likely benefit from cardiac evaluation this hospitalization to assess operative risk.  This followed by Dr. Jeffrie and was just seen last month.  Send had some improvement in his cardiac function that he may have slipped back down again.  Deferred need for echocardiogram and further workup right now to the admitting medicine service.   Surgery will help follow in consult, most likely follow-up after HIDA scan done   I reviewed nursing notes, Consultant cardiology Gastroenterology and internal medicine notes from prior admissions and outpatient visits.  Also Dr. Alger prior notes, last 24 h vitals and pain scores, last 48 h intake and output, last 24 h labs and trends, and last 24 h imaging results. I have reviewed this patient's available data, including medical history, events of note, test results, etc as part of my evaluation.  A significant portion of that time was spent in counseling.  Care during the described time interval was provided by me.  This care required high  level of medical decision making.  07/14/2023  Elspeth KYM Schultze, MD, FACS, MASCRS Esophageal, Gastrointestinal & Colorectal Surgery Robotic and Minimally Invasive Surgery  [facility] [address] [place], [place] [address] [phone] Fax [phone] Main  CONTACT INFORMATION: Weekday  (9AM-5PM): Call [facility] main office at [phone] Weeknight (5PM-9AM) or Weekend/Holiday: Check EPIC Web Links tab & use AMION (password  [email or web address]) for General Surgery [facility] coverage  Please, DO NOT use SecureChat  (it is not reliable communication to reach operating surgeons & will lead to a delay in care).   Epic staff messaging available for outptient concerns needing 1-2 business day response.      07/14/2023      Past Medical History:  Diagnosis Date   Allergy    Aortic stenosis    Arthritis    Balance problem 04/26/2022   Blood transfusion without reported diagnosis    CAD (coronary artery disease)    Cataract    both eyes surgically removed   Diabetes mellitus, type 2 (HCC)    Diverticulosis of colon (without mention of hemorrhage)    Duodenal ulcer perforation (HCC)    blood transfusion   Flesh-eating bacteria (HCC) 1995   GERD (gastroesophageal reflux disease)    Glaucoma    pre glaucoma on Xalatan  drops   Heart murmur    History of diabetic retinopathy    History of gastrointestinal bleeding    History of necrotizing fasciitis  History of prosthetic aortic valve    Hypercholesteremia    Myocardial infarction (HCC) 02/23/2016   Neuropathic pain 04/26/2022   Nightmares    CHRONIC   OSA (obstructive sleep apnea)    borderline no CPAP    Personal history of colonic polyps 11/23/2009   TUBULAR ADENOMA   Postoperative anemia    Sinus bradycardia    Sleep apnea    no cpap   Stomach ulcer    Hx of   Systolic hypertension    Vitamin B12 deficiency     Past Surgical History:  Procedure Laterality Date   AORTIC VALVE REPLACEMENT     BLEPHAROPLASTY  11-24-14   CARDIAC CATHETERIZATION N/A 03/01/2016   Procedure: Coronary/Graft Angiography;  Surgeon: Peter M Jordan, MD;  Location: [facility] INVASIVE CV LAB;  Service: Cardiovascular;  Laterality: N/A;   CATARACT EXTRACTION W/ INTRAOCULAR LENS IMPLANT     OD   COLONOSCOPY     CORONARY ARTERY BYPASS GRAFT  2010   x 4    flesh eating disease surgery      surgeries x 7    HERNIA REPAIR  1972   IR EXCHANGE BILIARY DRAIN  03/07/2023   IR EXCHANGE BILIARY DRAIN  04/18/2023   IR PERC CHOLECYSTOSTOMY  03/02/2023   POLYPECTOMY     RETINAL LASER PROCEDURE     SCROTAL SURGERY  1995   resection   UMBILICAL HERNIA REPAIR N/A 04/10/2013   Procedure: HERNIA REPAIR UMBILICAL ADULT with mesh;  Surgeon: Donnice KATHEE Lunger, MD;  Location: [facility] ORS;  Service: General;  Laterality: N/A;    Social History   Socioeconomic History   Marital status: Married    Spouse name: Not on file   Number of children: 0   Years of education: Not on file   Highest education level: Not on file  Occupational History   Occupation: [employer]    Employer: [employer]   Occupation: Retired  Tobacco Use   Smoking status: Never   Smokeless tobacco: Never  Vaping Use   Vaping status: Never Used  Substance and Sexual Activity   Alcohol  use: No    Alcohol /week: 0.0 standard drinks of alcohol    Drug use: No   Sexual activity: Not on file  Other Topics Concern   Not on file  Social History Narrative   Not on file   Social Drivers of Health   Financial Resource Strain: Not on file  Food Insecurity: No Food Insecurity (02/25/2023)   Hunger Vital Sign    Worried About Running Out of Food in the Last Year: Never true    Ran Out of Food in the Last Year: Never true  Transportation Needs: No Transportation Needs (02/25/2023)   PRAPARE - Administrator, Civil Service (Medical): No    Lack of Transportation (Non-Medical): No  Physical Activity: Not on file  Stress: Not on file  Social Connections: Not on file  Intimate Partner Violence: Not At Risk (02/25/2023)   Humiliation, Afraid, Rape, and Kick questionnaire    Fear of Current or Ex-Partner: No    Emotionally Abused: No    Physically Abused: No    Sexually Abused: No    Family History  Problem Relation Age of Onset   Cirrhosis Father    Alcohol  abuse Father         father murdered   Rectal cancer Mother    Diabetes Mother    Colon cancer Mother 33  rectal cancer   Hypertension Mother    Hypertension Sister    Diabetes Sister    Heart attack Neg Hx    Stroke Neg Hx    Colon polyps Neg Hx    Esophageal cancer Neg Hx    Stomach cancer Neg Hx     Current Facility-Administered Medications  Medication Dose Route Frequency Provider Last Rate Last Admin   acetaminophen  (TYLENOL ) suppository 650 mg  650 mg Rectal Q6H PRN Sheldon Standing, MD       acetaminophen  (TYLENOL ) tablet 325-650 mg  325-650 mg Oral Q6H PRN Sheldon Standing, MD       alum & mag hydroxide-simeth (MAALOX/MYLANTA) 200-200-20 MG/5ML suspension 30 mL  30 mL Oral Q6H PRN Sheldon Standing, MD       bisacodyl  (DULCOLAX) suppository 10 mg  10 mg Rectal Daily Sheldon Standing, MD       HYDROmorphone  (DILAUDID ) injection 0.5-1 mg  0.5-1 mg Intravenous Q3H PRN Sheldon Standing, MD       lactated ringers  bolus 1,000 mL  1,000 mL Intravenous Q8H PRN Sheldon Standing, MD       magic mouthwash  15 mL Oral QID PRN Sheldon Standing, MD       menthol -cetylpyridinium (CEPACOL) lozenge 3 mg  1 lozenge Oral PRN Sheldon Standing, MD       methocarbamol  (ROBAXIN ) injection 1,000 mg  1,000 mg Intravenous Q6H PRN Sheldon Standing, MD       methocarbamol  (ROBAXIN ) tablet 1,000 mg  1,000 mg Oral Q6H PRN Sheldon Standing, MD       metroNIDAZOLE  (FLAGYL ) IVPB 500 mg  500 mg Intravenous Once Countryman, Chase, MD       naphazoline-glycerin  (CLEAR EYES REDNESS) ophth solution 1-2 drop  1-2 drop Both Eyes QID PRN Sheldon Standing, MD       ondansetron  (ZOFRAN ) injection 4 mg  4 mg Intravenous Q6H PRN Deshayla Empson, Standing, MD       phenol (CHLORASEPTIC) mouth spray 2 spray  2 spray Mouth/Throat PRN Sheldon Standing, MD       piperacillin -tazobactam (ZOSYN ) IVPB 3.375 g  3.375 g Intravenous Q8H Jeter Tomey, MD       polycarbophil (FIBERCON) tablet 625 mg  625 mg Oral BID Sheldon Standing, MD       prochlorperazine  (COMPAZINE ) injection 5-10 mg  5-10 mg  Intravenous Q4H PRN Sheldon Standing, MD       simethicone  (MYLICON) 40 MG/0.6ML suspension 80 mg  80 mg Oral QID PRN Sheldon Standing, MD       sodium chloride  (OCEAN) 0.65 % nasal spray 1-2 spray  1-2 spray Each Nare Q6H PRN Sheldon Standing, MD       traMADol  (ULTRAM ) tablet 50-100 mg  50-100 mg Oral Q6H PRN Sheldon Standing, MD       Current Outpatient Medications  Medication Sig Dispense Refill   acetaminophen  (TYLENOL ) 500 MG tablet Take 500 mg by mouth daily as needed for moderate pain.     allopurinol  (ZYLOPRIM ) 300 MG tablet Take 300 mg by mouth daily.     ascorbic acid  (VITAMIN C ) 500 MG tablet Take by mouth daily.     atorvastatin  (LIPITOR) 80 MG tablet Take 1 tablet (80 mg total) by mouth daily.     Cholecalciferol (VITAMIN D-3 PO) Take 1 capsule by mouth in the morning and at bedtime.     Coenzyme Q10 (COQ10) 100 MG CAPS Take 100 mg by mouth daily.     Cyanocobalamin  (VITAMIN B-12 IJ) Inject 1,000 mcg into the  muscle every 30 (thirty) days. Amt/dose is unknown     cycloSPORINE (RESTASIS) 0.05 % ophthalmic emulsion Place 1 drop into both eyes 2 (two) times daily.     empagliflozin  (JARDIANCE ) 10 MG TABS tablet Take 1 tablet (10 mg total) by mouth daily. 30 tablet 6   fish oil-omega-3 fatty acids 1000 MG capsule Take by mouth daily.     fluorouracil (EFUDEX) 5 % cream Apply topically daily.     glipiZIDE (GLUCOTROL) 5 MG tablet Take 5 mg by mouth every morning.     latanoprost  (XALATAN ) 0.005 % ophthalmic solution Place 1 drop into both eyes at bedtime.     metFORMIN (GLUCOPHAGE) 1000 MG tablet Take 1,000 mg by mouth in the morning and at bedtime.     metoprolol  succinate (TOPROL -XL) 50 MG 24 hr tablet Take 1 tablet (50 mg total) by mouth daily. Take with or immediately following a meal.     minoxidil (LONITEN) 10 MG tablet Take 10 mg by mouth daily.   5   Multiple Vitamin (MULTIVITAMIN) tablet Take 1 tablet by mouth daily.     nitroGLYCERIN  (NITROSTAT ) 0.4 MG SL tablet Place 1 tablet under  the tongue as needed.  12   saccharomyces boulardii (FLORASTOR) 250 MG capsule Take 1 capsule (250 mg total) by mouth 2 (two) times daily.     spironolactone  (ALDACTONE ) 25 MG tablet Take 0.5 tablets (12.5 mg total) by mouth daily. 30 tablet 3   trolamine salicylate (ASPERCREME) 10 % cream Apply 1 application  topically as needed for muscle pain.     VITAMIN A PO Take by mouth daily.       Allergies  Allergen Reactions   Shellfish-Derived Products Anaphylaxis   Nsaids Other (See Comments)    H/o gastric ulcers = NO NSAIDs per GI   Zolpidem Tartrate     Other Reaction(s): Other (See Comments)  Hallucinations     BP 100/69   Pulse (!) 119   Temp 98.2 F (36.8 C) (Oral)   Resp (!) 29   Ht 5' 7 (1.702 m)   Wt 54.4 kg   SpO2 94%   BMI 18.79 kg/m     Results:   Labs: Results for orders placed or performed during the hospital encounter of 07/14/23 (from the past 48 hours)  CBC with Differential     Status: Abnormal   Collection Time: 07/14/23  6:47 PM  Result Value Ref Range   WBC 24.3 (H) 4.0 - 10.5 K/uL   RBC 3.38 (L) 4.22 - 5.81 MIL/uL   Hemoglobin 10.1 (L) 13.0 - 17.0 g/dL   HCT 70.7 (L) 60.9 - 47.9 %   MCV 86.4 80.0 - 100.0 fL   MCH 29.9 26.0 - 34.0 pg   MCHC 34.6 30.0 - 36.0 g/dL   RDW 82.7 (H) 88.4 - 84.4 %   Platelets 263 150 - 400 K/uL   nRBC 0.0 0.0 - 0.2 %   Neutrophils Relative % 89 %   Neutro Abs 21.7 (H) 1.7 - 7.7 K/uL   Lymphocytes Relative 3 %   Lymphs Abs 0.8 0.7 - 4.0 K/uL   Monocytes Relative 7 %   Monocytes Absolute 1.6 (H) 0.1 - 1.0 K/uL   Eosinophils Relative 0 %   Eosinophils Absolute 0.0 0.0 - 0.5 K/uL   Basophils Relative 0 %   Basophils Absolute 0.0 0.0 - 0.1 K/uL   Immature Granulocytes 1 %   Abs Immature Granulocytes 0.20 (H) 0.00 - 0.07 K/uL  Comment: Performed at [facility], [address], [place], [place] [address]  Comprehensive metabolic panel     Status: Abnormal   Collection Time: 07/14/23  6:47 PM   Result Value Ref Range   Sodium 134 (L) 135 - 145 mmol/L   Potassium 4.5 3.5 - 5.1 mmol/L   Chloride 100 98 - 111 mmol/L   CO2 16 (L) 22 - 32 mmol/L   Glucose, Bld 169 (H) 70 - 99 mg/dL    Comment: Glucose reference range applies only to samples taken after fasting for at least 8 hours.   BUN 48 (H) 8 - 23 mg/dL   Creatinine, Ser 8.03 (H) 0.61 - 1.24 mg/dL   Calcium  9.6 8.9 - 10.3 mg/dL   Total Protein 6.7 6.5 - 8.1 g/dL   Albumin 3.2 (L) 3.5 - 5.0 g/dL   AST 778 (H) 15 - 41 U/L   ALT 192 (H) 0 - 44 U/L   Alkaline Phosphatase 178 (H) 38 - 126 U/L   Total Bilirubin 2.5 (H) 0.0 - 1.2 mg/dL   GFR, Estimated 34 (L) >60 mL/min    Comment: (NOTE) Calculated using the CKD-EPI Creatinine Equation (2021)    Anion gap 18 (H) 5 - 15    Comment: Performed at [facility], [address] [place], [place] [address]  Troponin I (High Sensitivity)     Status: Abnormal   Collection Time: 07/14/23  6:47 PM  Result Value Ref Range   Troponin I (High Sensitivity) 18 (H) <18 ng/L    Comment: (NOTE) Elevated high sensitivity troponin I (hsTnI) values and significant  changes across serial measurements may suggest ACS but many other  chronic and acute conditions are known to elevate hsTnI results.  Refer to the Links section for chest pain algorithms and additional  guidance. Performed at [facility], [address], [place], [place] [address]   Lipase, blood     Status: None   Collection Time: 07/14/23  6:47 PM  Result Value Ref Range   Lipase 43 11 - 51 U/L    Comment: Performed at [facility], [address], [place], [place] [address]  Brain natriuretic peptide     Status: Abnormal   Collection Time: 07/14/23  6:47 PM  Result Value Ref Range   B Natriuretic Peptide 2,349.7 (H) 0.0 - 100.0 pg/mL    Comment: Performed at [facility], [address] [place], [place] [address]  Resp panel by RT-PCR (RSV, Flu  A&B, Covid) Anterior Nasal Swab     Status: None   Collection Time: 07/14/23  7:01 PM   Specimen: Anterior Nasal Swab  Result Value Ref Range   SARS Coronavirus 2 by RT PCR NEGATIVE NEGATIVE    Comment: (NOTE) SARS-CoV-2 target nucleic acids are NOT DETECTED.  The SARS-CoV-2 RNA is generally detectable in upper respiratory specimens during the acute phase of infection. The lowest concentration of SARS-CoV-2 viral copies this assay can detect is 138 copies/mL. A negative result does not preclude SARS-Cov-2 infection and should not be used as the sole basis for treatment or other patient management decisions. A negative result may occur with  improper specimen collection/handling, submission of specimen other than nasopharyngeal swab, presence of viral mutation(s) within the areas targeted by this assay, and inadequate number of viral copies(<138 copies/mL). A negative result must be combined with clinical observations, patient history, and epidemiological information. The expected result is Negative.  Fact Sheet for  Patients:  bloggercourse.com  Fact Sheet for Healthcare Providers:  seriousbroker.it  This test is no t yet approved or cleared by the [place]  FDA and  has been authorized for detection and/or diagnosis of SARS-CoV-2 by FDA under an Emergency Use Authorization (EUA). This EUA will remain  in effect (meaning this test can be used) for the duration of the COVID-19 declaration under Section 564(b)(1) of the Act, 21 U.S.C.section 360bbb-3(b)(1), unless the authorization is terminated  or revoked sooner.       Influenza A by PCR NEGATIVE NEGATIVE   Influenza B by PCR NEGATIVE NEGATIVE    Comment: (NOTE) The Xpert Xpress SARS-CoV-2/FLU/RSV plus assay is intended as an aid in the diagnosis of influenza from Nasopharyngeal swab specimens and should not be used as a sole basis for treatment. Nasal washings  and aspirates are unacceptable for Xpert Xpress SARS-CoV-2/FLU/RSV testing.  Fact Sheet for Patients: bloggercourse.com  Fact Sheet for Healthcare Providers: seriousbroker.it  This test is not yet approved or cleared by the [place]  FDA and has been authorized for detection and/or diagnosis of SARS-CoV-2 by FDA under an Emergency Use Authorization (EUA). This EUA will remain in effect (meaning this test can be used) for the duration of the COVID-19 declaration under Section 564(b)(1) of the Act, 21 U.S.C. section 360bbb-3(b)(1), unless the authorization is terminated or revoked.     Resp Syncytial Virus by PCR NEGATIVE NEGATIVE    Comment: (NOTE) Fact Sheet for Patients: bloggercourse.com  Fact Sheet for Healthcare Providers: seriousbroker.it  This test is not yet approved or cleared by the [place]  FDA and has been authorized for detection and/or diagnosis of SARS-CoV-2 by FDA under an Emergency Use Authorization (EUA). This EUA will remain in effect (meaning this test can be used) for the duration of the COVID-19 declaration under Section 564(b)(1) of the Act, 21 U.S.C. section 360bbb-3(b)(1), unless the authorization is terminated or revoked.  Performed at [facility], [address], [place], [place] [address]   I-Stat CG4 Lactic Acid     Status: Abnormal   Collection Time: 07/14/23  7:14 PM  Result Value Ref Range   Lactic Acid, Venous 5.7 (HH) 0.5 - 1.9 mmol/L   Comment NOTIFIED PHYSICIAN   Troponin I (High Sensitivity)     Status: None   Collection Time: 07/14/23  7:42 PM  Result Value Ref Range   Troponin I (High Sensitivity) 16 <18 ng/L    Comment: (NOTE) Elevated high sensitivity troponin I (hsTnI) values and significant  changes across serial measurements may suggest ACS but many other  chronic and acute conditions are known to  elevate hsTnI results.  Refer to the Links section for chest pain algorithms and additional  guidance. Performed at [facility], [address], [place], [place] [address]   I-Stat CG4 Lactic Acid     Status: Abnormal   Collection Time: 07/14/23  8:05 PM  Result Value Ref Range   Lactic Acid, Venous 5.6 (HH) 0.5 - 1.9 mmol/L   Comment NOTIFIED PHYSICIAN   Urinalysis, w/ Reflex to Culture (Infection Suspected) -Urine, Clean Catch     Status: Abnormal   Collection Time: 07/14/23  9:10 PM  Result Value Ref Range   Specimen Source URINE, CLEAN CATCH    Color, Urine AMBER (A) YELLOW    Comment: BIOCHEMICALS MAY BE AFFECTED BY COLOR   APPearance HAZY (A) CLEAR   Specific Gravity, Urine 1.025 1.005 - 1.030   pH 5.0 5.0 - 8.0  Glucose, UA NEGATIVE NEGATIVE mg/dL   Hgb urine dipstick NEGATIVE NEGATIVE   Bilirubin Urine NEGATIVE NEGATIVE   Ketones, ur NEGATIVE NEGATIVE mg/dL   Protein, ur NEGATIVE NEGATIVE mg/dL   Nitrite NEGATIVE NEGATIVE   Leukocytes,Ua NEGATIVE NEGATIVE   RBC / HPF 0-5 0 - 5 RBC/hpf   WBC, UA 0-5 0 - 5 WBC/hpf    Comment:        Reflex urine culture not performed if WBC <=10, OR if Squamous epithelial cells >5. If Squamous epithelial cells >5 suggest recollection.    Bacteria, UA NONE SEEN NONE SEEN   Squamous Epithelial / HPF 0-5 0 - 5 /HPF   Mucus PRESENT    Hyaline Casts, UA PRESENT     Comment: Performed at [facility], [address], [place], [place] [address]    Imaging / Studies: CT ABDOMEN PELVIS W CONTRAST Result Date: 07/14/2023 CLINICAL DATA:  Weakness and possible sepsis EXAM: CT ABDOMEN AND PELVIS WITH CONTRAST TECHNIQUE: Multidetector CT imaging of the abdomen and pelvis was performed using the standard protocol following bolus administration of intravenous contrast. RADIATION DOSE REDUCTION: This exam was performed according to the departmental dose-optimization program which includes automated exposure  control, adjustment of the mA and/or kV according to patient size and/or use of iterative reconstruction technique. CONTRAST:  75mL OMNIPAQUE  IOHEXOL  300 MG/ML  SOLN COMPARISON:  03/01/2023 FINDINGS: Lower chest: Mild interstitial edema is noted. No sizable effusion is seen. Hepatobiliary: Liver is within normal limits. Gallbladder is well distended with dependent calculus. Significant wall thickening and edema is noted consistent with acute cholecystitis till proven otherwise. Ultrasound may be helpful for further evaluation. The common bile duct is prominent measuring up to 11 mm greater than that expected for the patient's given age. No definitive distal CBD calculus is seen. Pancreas: Unremarkable. No pancreatic ductal dilatation or surrounding inflammatory changes. Spleen: Calcified granulomas are noted throughout the spleen. Adrenals/Urinary Tract: Adrenal glands are within normal limits. Bilateral renal cysts are noted which appear simple in nature and stable from previous exam. No follow-up is recommended. No renal calculi or obstructive changes are seen. The bladder is decompressed. Stomach/Bowel: Scattered diverticular change of the colon is noted. No evidence of diverticulitis is seen. The appendix appears within normal limits. Small bowel and stomach are unremarkable. Vascular/Lymphatic: Aortic atherosclerosis. No enlarged abdominal or pelvic lymph nodes. Reproductive: Prostate is unremarkable. Other: Mild free fluid is noted within the abdomen and pelvis. Musculoskeletal: Degenerative changes of lumbar spine are noted. IMPRESSION: Inflammatory changes involving the gallbladder consistent with acute cholecystitis. Ultrasound may be helpful for further evaluation as necessary. Common bile duct dilatation is seen although no discrete distal calculus is noted. Diverticulosis without diverticulitis. Mild free fluid in the abdomen. Electronically Signed   By: Oneil Devonshire M.D.   On: 07/14/2023 21:57   DG  Chest Portable 1 View Result Date: 07/14/2023 CLINICAL DATA:  Generalized weakness. EXAM: PORTABLE CHEST 1 VIEW COMPARISON:  X-ray 02/28/2023.  CT 03/01/2023.  Older exams as well. FINDINGS: Underinflation. Enlarged cardiac silhouette with calcified aorta. Sternal wires. Prosthetic valves. Slight interstitial changes identified with some subtle opacity left lung base. No pneumothorax or effusion. Old left-sided rib fractures. IMPRESSION: Underinflation. Postop chest. Enlarged cardiopericardial silhouette. Slight interstitial changes. Mild opacity left lung base. Recommend follow-up. Electronically Signed   By: Ranell Bring M.D.   On: 07/14/2023 19:39    Medications / Allergies: per chart  Antibiotics: Anti-infectives (From admission, onward)    Start  Dose/Rate Route Frequency Ordered Stop   07/14/23 2245  piperacillin -tazobactam (ZOSYN ) IVPB 3.375 g        3.375 g 12.5 mL/hr over 240 Minutes Intravenous Every 8 hours 07/14/23 2230 07/19/23 2159   07/14/23 2215  metroNIDAZOLE  (FLAGYL ) IVPB 500 mg        500 mg 100 mL/hr over 60 Minutes Intravenous  Once 07/14/23 2208     07/14/23 1945  vancomycin  (VANCOCIN ) IVPB 1000 mg/200 mL premix        1,000 mg 200 mL/hr over 60 Minutes Intravenous STAT 07/14/23 1937 07/14/23 2153   07/14/23 1915  ceFEPIme  (MAXIPIME ) 2 g in sodium chloride  0.9 % 100 mL IVPB        2 g 200 mL/hr over 30 Minutes Intravenous  Once 07/14/23 1906 07/14/23 2013         Note: Portions of this report may have been transcribed using voice recognition software. Every effort was made to ensure accuracy; however, inadvertent computerized transcription errors may be present.   Any transcriptional errors that result from this process are unintentional.    Elspeth KYM Schultze, MD, FACS, MASCRS Esophageal, Gastrointestinal & Colorectal Surgery Robotic and Minimally Invasive Surgery  [facility] [address], [address] [place], [place] [address] [phone] Fax [phone] Main  CONTACT INFORMATION: Weekday (9AM-5PM): Call [facility] main office at [phone] Weeknight (5PM-9AM) or Weekend/Holiday: Check EPIC Web Links tab & use AMION (password  [email or web address]) for General Surgery [facility] coverage  Please, DO NOT use SecureChat  (it is not reliable communication to reach operating surgeons & will lead to a delay in care).   Epic staff messaging available for outptient concerns needing 1-2 business day response.       07/14/2023  10:31 PM

## 2023-07-14 NOTE — ED Notes (Signed)
 Talked w/ pt's wife and provided updates about plan of care.

## 2023-07-15 ENCOUNTER — Inpatient Hospital Stay (HOSPITAL_COMMUNITY): Payer: Medicare Other

## 2023-07-15 DIAGNOSIS — R1011 Right upper quadrant pain: Secondary | ICD-10-CM | POA: Diagnosis not present

## 2023-07-15 DIAGNOSIS — C439 Malignant melanoma of skin, unspecified: Secondary | ICD-10-CM | POA: Diagnosis present

## 2023-07-15 DIAGNOSIS — A419 Sepsis, unspecified organism: Secondary | ICD-10-CM | POA: Diagnosis present

## 2023-07-15 DIAGNOSIS — I119 Hypertensive heart disease without heart failure: Secondary | ICD-10-CM | POA: Diagnosis not present

## 2023-07-15 DIAGNOSIS — I5020 Unspecified systolic (congestive) heart failure: Secondary | ICD-10-CM | POA: Diagnosis not present

## 2023-07-15 DIAGNOSIS — N179 Acute kidney failure, unspecified: Secondary | ICD-10-CM | POA: Diagnosis present

## 2023-07-15 DIAGNOSIS — R945 Abnormal results of liver function studies: Secondary | ICD-10-CM | POA: Diagnosis not present

## 2023-07-15 DIAGNOSIS — Z953 Presence of xenogenic heart valve: Secondary | ICD-10-CM | POA: Diagnosis not present

## 2023-07-15 DIAGNOSIS — Z515 Encounter for palliative care: Secondary | ICD-10-CM | POA: Diagnosis not present

## 2023-07-15 DIAGNOSIS — N1832 Chronic kidney disease, stage 3b: Secondary | ICD-10-CM | POA: Diagnosis present

## 2023-07-15 DIAGNOSIS — F411 Generalized anxiety disorder: Secondary | ICD-10-CM | POA: Diagnosis present

## 2023-07-15 DIAGNOSIS — E7800 Pure hypercholesterolemia, unspecified: Secondary | ICD-10-CM | POA: Diagnosis present

## 2023-07-15 DIAGNOSIS — Z9181 History of falling: Secondary | ICD-10-CM | POA: Diagnosis not present

## 2023-07-15 DIAGNOSIS — E872 Acidosis, unspecified: Secondary | ICD-10-CM | POA: Diagnosis not present

## 2023-07-15 DIAGNOSIS — R531 Weakness: Secondary | ICD-10-CM | POA: Diagnosis not present

## 2023-07-15 DIAGNOSIS — R262 Difficulty in walking, not elsewhere classified: Secondary | ICD-10-CM | POA: Diagnosis not present

## 2023-07-15 DIAGNOSIS — L309 Dermatitis, unspecified: Secondary | ICD-10-CM | POA: Diagnosis not present

## 2023-07-15 DIAGNOSIS — I959 Hypotension, unspecified: Secondary | ICD-10-CM | POA: Diagnosis not present

## 2023-07-15 DIAGNOSIS — I2510 Atherosclerotic heart disease of native coronary artery without angina pectoris: Secondary | ICD-10-CM | POA: Diagnosis present

## 2023-07-15 DIAGNOSIS — D649 Anemia, unspecified: Secondary | ICD-10-CM | POA: Diagnosis not present

## 2023-07-15 DIAGNOSIS — M1380 Other specified arthritis, unspecified site: Secondary | ICD-10-CM | POA: Diagnosis not present

## 2023-07-15 DIAGNOSIS — I5022 Chronic systolic (congestive) heart failure: Secondary | ICD-10-CM | POA: Diagnosis not present

## 2023-07-15 DIAGNOSIS — Z951 Presence of aortocoronary bypass graft: Secondary | ICD-10-CM | POA: Diagnosis not present

## 2023-07-15 DIAGNOSIS — E785 Hyperlipidemia, unspecified: Secondary | ICD-10-CM | POA: Diagnosis not present

## 2023-07-15 DIAGNOSIS — Z66 Do not resuscitate: Secondary | ICD-10-CM | POA: Diagnosis present

## 2023-07-15 DIAGNOSIS — G9341 Metabolic encephalopathy: Secondary | ICD-10-CM | POA: Diagnosis present

## 2023-07-15 DIAGNOSIS — M6281 Muscle weakness (generalized): Secondary | ICD-10-CM | POA: Diagnosis not present

## 2023-07-15 DIAGNOSIS — I4821 Permanent atrial fibrillation: Secondary | ICD-10-CM | POA: Diagnosis present

## 2023-07-15 DIAGNOSIS — F419 Anxiety disorder, unspecified: Secondary | ICD-10-CM | POA: Diagnosis not present

## 2023-07-15 DIAGNOSIS — I5021 Acute systolic (congestive) heart failure: Secondary | ICD-10-CM

## 2023-07-15 DIAGNOSIS — K8309 Other cholangitis: Secondary | ICD-10-CM | POA: Diagnosis present

## 2023-07-15 DIAGNOSIS — D72828 Other elevated white blood cell count: Secondary | ICD-10-CM | POA: Diagnosis not present

## 2023-07-15 DIAGNOSIS — Z7189 Other specified counseling: Secondary | ICD-10-CM | POA: Diagnosis not present

## 2023-07-15 DIAGNOSIS — R932 Abnormal findings on diagnostic imaging of liver and biliary tract: Secondary | ICD-10-CM | POA: Diagnosis not present

## 2023-07-15 DIAGNOSIS — K219 Gastro-esophageal reflux disease without esophagitis: Secondary | ICD-10-CM | POA: Diagnosis not present

## 2023-07-15 DIAGNOSIS — K810 Acute cholecystitis: Secondary | ICD-10-CM | POA: Diagnosis not present

## 2023-07-15 DIAGNOSIS — R2689 Other abnormalities of gait and mobility: Secondary | ICD-10-CM | POA: Diagnosis not present

## 2023-07-15 DIAGNOSIS — N170 Acute kidney failure with tubular necrosis: Secondary | ICD-10-CM | POA: Diagnosis not present

## 2023-07-15 DIAGNOSIS — E43 Unspecified severe protein-calorie malnutrition: Secondary | ICD-10-CM | POA: Diagnosis not present

## 2023-07-15 DIAGNOSIS — E1165 Type 2 diabetes mellitus with hyperglycemia: Secondary | ICD-10-CM | POA: Diagnosis present

## 2023-07-15 DIAGNOSIS — E1122 Type 2 diabetes mellitus with diabetic chronic kidney disease: Secondary | ICD-10-CM | POA: Diagnosis present

## 2023-07-15 DIAGNOSIS — E8721 Acute metabolic acidosis: Secondary | ICD-10-CM | POA: Diagnosis present

## 2023-07-15 DIAGNOSIS — E119 Type 2 diabetes mellitus without complications: Secondary | ICD-10-CM | POA: Diagnosis not present

## 2023-07-15 DIAGNOSIS — E113393 Type 2 diabetes mellitus with moderate nonproliferative diabetic retinopathy without macular edema, bilateral: Secondary | ICD-10-CM | POA: Diagnosis not present

## 2023-07-15 DIAGNOSIS — G4733 Obstructive sleep apnea (adult) (pediatric): Secondary | ICD-10-CM | POA: Diagnosis present

## 2023-07-15 DIAGNOSIS — R7989 Other specified abnormal findings of blood chemistry: Secondary | ICD-10-CM | POA: Diagnosis not present

## 2023-07-15 DIAGNOSIS — Z794 Long term (current) use of insulin: Secondary | ICD-10-CM | POA: Diagnosis not present

## 2023-07-15 DIAGNOSIS — R41841 Cognitive communication deficit: Secondary | ICD-10-CM | POA: Diagnosis not present

## 2023-07-15 DIAGNOSIS — I214 Non-ST elevation (NSTEMI) myocardial infarction: Secondary | ICD-10-CM | POA: Diagnosis not present

## 2023-07-15 DIAGNOSIS — Z1152 Encounter for screening for COVID-19: Secondary | ICD-10-CM | POA: Diagnosis not present

## 2023-07-15 DIAGNOSIS — K8010 Calculus of gallbladder with chronic cholecystitis without obstruction: Secondary | ICD-10-CM | POA: Diagnosis not present

## 2023-07-15 DIAGNOSIS — R6520 Severe sepsis without septic shock: Secondary | ICD-10-CM | POA: Diagnosis not present

## 2023-07-15 DIAGNOSIS — I130 Hypertensive heart and chronic kidney disease with heart failure and stage 1 through stage 4 chronic kidney disease, or unspecified chronic kidney disease: Secondary | ICD-10-CM | POA: Diagnosis present

## 2023-07-15 DIAGNOSIS — I129 Hypertensive chronic kidney disease with stage 1 through stage 4 chronic kidney disease, or unspecified chronic kidney disease: Secondary | ICD-10-CM | POA: Diagnosis not present

## 2023-07-15 DIAGNOSIS — R000 Tachycardia, unspecified: Secondary | ICD-10-CM | POA: Diagnosis not present

## 2023-07-15 DIAGNOSIS — A4151 Sepsis due to Escherichia coli [E. coli]: Secondary | ICD-10-CM | POA: Diagnosis present

## 2023-07-15 DIAGNOSIS — E1349 Other specified diabetes mellitus with other diabetic neurological complication: Secondary | ICD-10-CM | POA: Diagnosis not present

## 2023-07-15 DIAGNOSIS — I4891 Unspecified atrial fibrillation: Secondary | ICD-10-CM | POA: Diagnosis not present

## 2023-07-15 DIAGNOSIS — E1169 Type 2 diabetes mellitus with other specified complication: Secondary | ICD-10-CM | POA: Diagnosis present

## 2023-07-15 DIAGNOSIS — J9601 Acute respiratory failure with hypoxia: Secondary | ICD-10-CM | POA: Diagnosis not present

## 2023-07-15 DIAGNOSIS — I493 Ventricular premature depolarization: Secondary | ICD-10-CM | POA: Diagnosis not present

## 2023-07-15 DIAGNOSIS — R6521 Severe sepsis with septic shock: Secondary | ICD-10-CM | POA: Diagnosis present

## 2023-07-15 DIAGNOSIS — K8012 Calculus of gallbladder with acute and chronic cholecystitis without obstruction: Secondary | ICD-10-CM | POA: Diagnosis present

## 2023-07-15 DIAGNOSIS — I5023 Acute on chronic systolic (congestive) heart failure: Secondary | ICD-10-CM | POA: Diagnosis present

## 2023-07-15 DIAGNOSIS — H401131 Primary open-angle glaucoma, bilateral, mild stage: Secondary | ICD-10-CM | POA: Diagnosis not present

## 2023-07-15 DIAGNOSIS — R4182 Altered mental status, unspecified: Secondary | ICD-10-CM | POA: Diagnosis not present

## 2023-07-15 DIAGNOSIS — Z7401 Bed confinement status: Secondary | ICD-10-CM | POA: Diagnosis not present

## 2023-07-15 DIAGNOSIS — R944 Abnormal results of kidney function studies: Secondary | ICD-10-CM | POA: Diagnosis not present

## 2023-07-15 DIAGNOSIS — I10 Essential (primary) hypertension: Secondary | ICD-10-CM | POA: Diagnosis not present

## 2023-07-15 LAB — BLOOD CULTURE ID PANEL (REFLEXED) - BCID2

## 2023-07-15 LAB — GLUCOSE, CAPILLARY
Glucose-Capillary: 127 mg/dL — ABNORMAL HIGH (ref 70–99)
Glucose-Capillary: 140 mg/dL — ABNORMAL HIGH (ref 70–99)
Glucose-Capillary: 145 mg/dL — ABNORMAL HIGH (ref 70–99)
Glucose-Capillary: 148 mg/dL — ABNORMAL HIGH (ref 70–99)
Glucose-Capillary: 166 mg/dL — ABNORMAL HIGH (ref 70–99)
Glucose-Capillary: 99 mg/dL (ref 70–99)

## 2023-07-15 LAB — COMPREHENSIVE METABOLIC PANEL
ALT: 153 U/L — ABNORMAL HIGH (ref 0–44)
AST: 151 U/L — ABNORMAL HIGH (ref 15–41)
Albumin: 2.9 g/dL — ABNORMAL LOW (ref 3.5–5.0)
Alkaline Phosphatase: 149 U/L — ABNORMAL HIGH (ref 38–126)
Anion gap: 16 — ABNORMAL HIGH (ref 5–15)
BUN: 50 mg/dL — ABNORMAL HIGH (ref 8–23)
CO2: 15 mmol/L — ABNORMAL LOW (ref 22–32)
Calcium: 8.9 mg/dL (ref 8.9–10.3)
Chloride: 99 mmol/L (ref 98–111)
Creatinine, Ser: 1.82 mg/dL — ABNORMAL HIGH (ref 0.61–1.24)
GFR, Estimated: 38 mL/min — ABNORMAL LOW (ref >60)
Glucose, Bld: 157 mg/dL — ABNORMAL HIGH (ref 70–99)
Potassium: 4.6 mmol/L (ref 3.5–5.1)
Sodium: 130 mmol/L — ABNORMAL LOW (ref 135–145)
Total Bilirubin: 3.5 mg/dL — ABNORMAL HIGH (ref 0.0–1.2)
Total Protein: 6.1 g/dL — ABNORMAL LOW (ref 6.5–8.1)

## 2023-07-15 LAB — ECHOCARDIOGRAM LIMITED
AR max vel: 1.23 cm2
AV Area VTI: 1.5 cm2
AV Area mean vel: 1.34 cm2
AV Mean grad: 7 mm[Hg]
AV Peak grad: 14.1 mm[Hg]
Ao pk vel: 1.88 m/s
Height: 67 in
S' Lateral: 4.7 cm
Weight: 2049.4 [oz_av]

## 2023-07-15 LAB — PROCALCITONIN: Procalcitonin: 3.86 ng/mL

## 2023-07-15 LAB — TYPE AND SCREEN
ABO/RH(D): A POS
Antibody Screen: NEGATIVE

## 2023-07-15 LAB — CBC
HCT: 29.2 % — ABNORMAL LOW (ref 39.0–52.0)
Hemoglobin: 9.3 g/dL — ABNORMAL LOW (ref 13.0–17.0)
MCH: 28.5 pg (ref 26.0–34.0)
MCHC: 31.8 g/dL (ref 30.0–36.0)
MCV: 89.6 fL (ref 80.0–100.0)
Platelets: 284 10*3/uL (ref 150–400)
RBC: 3.26 MIL/uL — ABNORMAL LOW (ref 4.22–5.81)
RDW: 17.8 % — ABNORMAL HIGH (ref 11.5–15.5)
WBC: 27 10*3/uL — ABNORMAL HIGH (ref 4.0–10.5)
nRBC: 0 % (ref 0.0–0.2)

## 2023-07-15 LAB — BASIC METABOLIC PANEL WITH GFR
Anion gap: 16 — ABNORMAL HIGH (ref 5–15)
BUN: 54 mg/dL — ABNORMAL HIGH (ref 8–23)
CO2: 16 mmol/L — ABNORMAL LOW (ref 22–32)
Calcium: 9 mg/dL (ref 8.9–10.3)
Chloride: 99 mmol/L (ref 98–111)
Creatinine, Ser: 2.25 mg/dL — ABNORMAL HIGH (ref 0.61–1.24)
GFR, Estimated: 29 mL/min — ABNORMAL LOW
Glucose, Bld: 166 mg/dL — ABNORMAL HIGH (ref 70–99)
Potassium: 4.4 mmol/L (ref 3.5–5.1)
Sodium: 131 mmol/L — ABNORMAL LOW (ref 135–145)

## 2023-07-15 LAB — PROTIME-INR
INR: 1.5 — ABNORMAL HIGH (ref 0.8–1.2)
INR: 1.5 — ABNORMAL HIGH (ref 0.8–1.2)
Prothrombin Time: 18.5 s — ABNORMAL HIGH (ref 11.4–15.2)
Prothrombin Time: 18.5 s — ABNORMAL HIGH (ref 11.4–15.2)

## 2023-07-15 LAB — PHOSPHORUS
Phosphorus: 3.6 mg/dL (ref 2.5–4.6)
Phosphorus: 3.7 mg/dL (ref 2.5–4.6)

## 2023-07-15 LAB — LACTIC ACID, PLASMA
Lactic Acid, Venous: 3.7 mmol/L (ref 0.5–1.9)
Lactic Acid, Venous: 4.4 mmol/L (ref 0.5–1.9)

## 2023-07-15 LAB — POTASSIUM: Potassium: 4.8 mmol/L (ref 3.5–5.1)

## 2023-07-15 LAB — MRSA NEXT GEN BY PCR, NASAL: MRSA by PCR Next Gen: NOT DETECTED

## 2023-07-15 LAB — MAGNESIUM
Magnesium: 1.4 mg/dL — ABNORMAL LOW (ref 1.7–2.4)
Magnesium: 1.4 mg/dL — ABNORMAL LOW (ref 1.7–2.4)

## 2023-07-15 LAB — CORTISOL: Cortisol, Plasma: 47.5 ug/dL

## 2023-07-15 LAB — LIPASE, BLOOD: Lipase: 133 U/L — ABNORMAL HIGH (ref 11–51)

## 2023-07-15 MED ORDER — MORPHINE SULFATE (PF) 2 MG/ML IV SOLN
INTRAVENOUS | Status: AC
  Filled 2023-07-15: qty 1

## 2023-07-15 MED ORDER — NOREPINEPHRINE 4 MG/250ML-% IV SOLN
2.0000 ug/min | INTRAVENOUS | Status: DC
  Administered 2023-07-15: 4 ug/min via INTRAVENOUS
  Administered 2023-07-15: 2 ug/min via INTRAVENOUS
  Filled 2023-07-15 (×2): qty 250

## 2023-07-15 MED ORDER — MAGNESIUM SULFATE 4 GM/100ML IV SOLN
4.0000 g | Freq: Once | INTRAVENOUS | Status: AC
Start: 2023-07-15 — End: 2023-07-15
  Administered 2023-07-15: 4 g via INTRAVENOUS
  Filled 2023-07-15: qty 100

## 2023-07-15 MED ORDER — DOCUSATE SODIUM 100 MG PO CAPS: 100.0000 mg | ORAL_CAPSULE | Freq: Two times a day (BID) | ORAL | Status: DC | PRN

## 2023-07-15 MED ORDER — TECHNETIUM TC 99M MEBROFENIN IV KIT
7.5000 | PACK | Freq: Once | INTRAVENOUS | Status: AC
  Administered 2023-07-15: 7.68 via INTRAVENOUS

## 2023-07-15 MED ORDER — MORPHINE SULFATE (PF) 2 MG/ML IV SOLN
2.0000 mg | Freq: Once | INTRAVENOUS | Status: AC
  Administered 2023-07-15: 2 mg via INTRAVENOUS

## 2023-07-15 MED ORDER — SODIUM CHLORIDE 0.9 % IV SOLN
2.0000 g | INTRAVENOUS | Status: DC
  Administered 2023-07-15 – 2023-07-17 (×3): 2 g via INTRAVENOUS
  Filled 2023-07-15 (×3): qty 20

## 2023-07-15 MED ORDER — CHLORHEXIDINE GLUCONATE CLOTH 2 % EX PADS
6.0000 | MEDICATED_PAD | Freq: Every day | CUTANEOUS | Status: DC
  Administered 2023-07-15 – 2023-07-26 (×10): 6 via TOPICAL

## 2023-07-15 MED ORDER — INSULIN ASPART 100 UNIT/ML IJ SOLN
0.0000 [IU] | INTRAMUSCULAR | Status: DC
  Administered 2023-07-15: 2 [IU] via SUBCUTANEOUS
  Administered 2023-07-15 (×4): 1 [IU] via SUBCUTANEOUS
  Administered 2023-07-16: 2 [IU] via SUBCUTANEOUS
  Administered 2023-07-16 – 2023-07-17 (×3): 1 [IU] via SUBCUTANEOUS
  Administered 2023-07-17 (×2): 2 [IU] via SUBCUTANEOUS
  Administered 2023-07-17: 1 [IU] via SUBCUTANEOUS
  Administered 2023-07-17: 3 [IU] via SUBCUTANEOUS
  Administered 2023-07-18 (×2): 1 [IU] via SUBCUTANEOUS
  Administered 2023-07-18: 2 [IU] via SUBCUTANEOUS
  Administered 2023-07-18: 1 [IU] via SUBCUTANEOUS
  Administered 2023-07-19 (×2): 2 [IU] via SUBCUTANEOUS
  Administered 2023-07-19: 3 [IU] via SUBCUTANEOUS
  Administered 2023-07-19: 5 [IU] via SUBCUTANEOUS
  Administered 2023-07-20: 1 [IU] via SUBCUTANEOUS
  Administered 2023-07-20: 2 [IU] via SUBCUTANEOUS
  Administered 2023-07-20: 1 [IU] via SUBCUTANEOUS
  Administered 2023-07-21: 5 [IU] via SUBCUTANEOUS
  Administered 2023-07-21: 3 [IU] via SUBCUTANEOUS
  Administered 2023-07-21 (×2): 1 [IU] via SUBCUTANEOUS
  Administered 2023-07-21 – 2023-07-22 (×4): 2 [IU] via SUBCUTANEOUS
  Administered 2023-07-22: 3 [IU] via SUBCUTANEOUS
  Administered 2023-07-23: 1 [IU] via SUBCUTANEOUS
  Administered 2023-07-23 (×2): 2 [IU] via SUBCUTANEOUS
  Administered 2023-07-23: 1 [IU] via SUBCUTANEOUS
  Administered 2023-07-23: 3 [IU] via SUBCUTANEOUS
  Administered 2023-07-24 (×3): 2 [IU] via SUBCUTANEOUS
  Administered 2023-07-25: 3 [IU] via SUBCUTANEOUS
  Administered 2023-07-25 – 2023-07-26 (×3): 2 [IU] via SUBCUTANEOUS
  Filled 2023-07-15: qty 0.09

## 2023-07-15 MED ORDER — LINEZOLID 600 MG/300ML IV SOLN
600.0000 mg | Freq: Two times a day (BID) | INTRAVENOUS | Status: DC
  Administered 2023-07-15: 600 mg via INTRAVENOUS
  Filled 2023-07-15: qty 300

## 2023-07-15 MED ORDER — SODIUM CHLORIDE 0.9 % IV SOLN
250.0000 mL | INTRAVENOUS | Status: AC
  Administered 2023-07-15: 250 mL via INTRAVENOUS

## 2023-07-15 MED ORDER — LACTATED RINGERS IV BOLUS
1000.0000 mL | Freq: Once | INTRAVENOUS | Status: AC
  Administered 2023-07-15: 1000 mL via INTRAVENOUS

## 2023-07-15 MED ORDER — PERFLUTREN LIPID MICROSPHERE
1.0000 mL | INTRAVENOUS | Status: AC | PRN
  Administered 2023-07-15: 2 mL via INTRAVENOUS

## 2023-07-15 MED ORDER — POLYETHYLENE GLYCOL 3350 17 G PO PACK: 17.0000 g | PACK | Freq: Every day | ORAL | Status: DC | PRN

## 2023-07-15 MED ORDER — HEPARIN SODIUM (PORCINE) 5000 UNIT/ML IJ SOLN
5000.0000 [IU] | Freq: Three times a day (TID) | INTRAMUSCULAR | Status: DC
  Administered 2023-07-15 – 2023-07-16 (×4): 5000 [IU] via SUBCUTANEOUS
  Filled 2023-07-15 (×4): qty 1

## 2023-07-15 NOTE — H&P (Addendum)
 NAME:  Nathan [name], MRN:  [id number], DOB:  1945-12-08, LOS: 0 ADMISSION DATE:  07/14/2023, CONSULTATION DATE:  1/4 REFERRING MD:  Dr. Jerral, CHIEF COMPLAINT:  septic shock; acute cholecystitis  History of Present Illness:  Patient is a 78 year old male with pertinent PMH previous cholecystitis (had perc choley drain placed 03/02/2023), DMT2, HTN, HLD, PAF not on AC due previous bleeding, systolic CHF, CAD s/p CABG and AVR, CKD 3a, OSA presents to [facility] ED on 1/3 with abdominal pain/sepsis.  Patient last admitted 02/2023 with chest pain.  Initially concern of MI with some acute systolic CHF.  Patient also with abdominal pain and imaging showing cholecystitis.  Per choley drain placed. Discharged on IV abx.  Over the past 3 days patient having generalized weakness and confusion.  On 1/3 went to [facility] urgent care and BP 68/42.  Was recommended to come to [facility] ED for further eval.  Initial BP 109/72.  Sats stable on room air.  Afebrile.  Patient having some abdominal pain.  CXR showing mild opacity of LLL.  CT ABD/pelvis showing inflammatory changes of gallbladder consistent with acute cholecystitis.  UA clear.  WBC 24.3 and LA 5.7.  Patient given IV fluids, cultures obtained, and started on broad-spectrum antibiotics.  Surgery consulted and no plan for emergent surgery at this time.  Despite IV fluids patient remained hypotensive requiring Levophed .  PCCM consulted for ICU admission.  Pertinent ED labs: Hgb 10.1, NA 134, CO2 16, glucose 169, creat 1.96 (baseline 1.5-1.9), AST 221, ALT 182, alk phos 178, bilirubin 2.5, AG 18, troponin 18 and 16, BNP 2349  Pertinent  Medical History   Past Medical History:  Diagnosis Date   Allergy    Aortic stenosis    Arthritis    Balance problem 04/26/2022   Blood transfusion without reported diagnosis    CAD (coronary artery disease)    Cataract    both eyes surgically removed   Diabetes mellitus, type 2 (HCC)    Diverticulosis of colon (without  mention of hemorrhage)    Duodenal ulcer perforation (HCC)    blood transfusion   Flesh-eating bacteria (HCC) 1995   GERD (gastroesophageal reflux disease)    Glaucoma    pre glaucoma on Xalatan  drops   Heart murmur    History of diabetic retinopathy    History of gastrointestinal bleeding    History of necrotizing fasciitis    History of prosthetic aortic valve    Hypercholesteremia    Myocardial infarction (HCC) 02/23/2016   Neuropathic pain 04/26/2022   Nightmares    CHRONIC   OSA (obstructive sleep apnea)    borderline no CPAP    Personal history of colonic polyps 11/23/2009   TUBULAR ADENOMA   Postoperative anemia    Sinus bradycardia    Sleep apnea    no cpap   Stomach ulcer    Hx of   Systolic hypertension    Vitamin B12 deficiency     Significant Hospital Events: Including procedures, antibiotic start and stop dates in addition to other pertinent events   1/4 admitted w/ septic shock 2/2 cholecystitis; pccm consulted for icu admission  Interim History / Subjective:   Patient without abdominal pain, nausea or vomiting. He does report productive cough.   DNR listed in chart from 03/04/23 IPAL noted.   Objective   Blood pressure 99/81, pulse 74, temperature 98.2 F (36.8 C), temperature source Oral, resp. rate (!) 23, height 5' 7 (1.702 m), weight 54.4 kg, SpO2 90%.  Intake/Output Summary (Last 24 hours) at 07/15/2023 0003 Last data filed at 07/14/2023 2336 Gross per 24 hour  Intake 125.14 ml  Output --  Net 125.14 ml   Filed Weights   07/14/23 1728  Weight: 54.4 kg    Examination: General: elderly male, no acute distress HENT: [place]/AT, dry mucous membranes Lungs: clear to auscultation, no wheezing Cardiovascular: irregularly irregular, tachycardic Abdomen: soft, non-tender, non-distended Extremities: warm, no edema Neuro: alert, following commands GU: n/a  Resolved Hospital Problem list     Assessment & Plan:  Shock - septic vs  cardiogenic - add peripheral levophed  for MAP goal 65 or greater - check random cortisol - has received 2L of IV fluids  Sepsis with unknown source: chronic cholecystitis vs pneumonia vs bacteremia - continue broad spectrum antibiotic coverage with vancomycin  and zosyn  - follow up cultures - check procalcitonin - check hida scan  Acute on Chronic Systolic Heart Failure Atrial Fibrillation -appears dry at this time - hold off on diuretics - not on chronic anticoagulation for a. fib - obtain limited echo  AKI Lactic Acidosis - monitor serum Cr  - monitor UOP - trend lactic acid level  Elevated Liver enzymes - monitor  Melanoma  - has appointment at [facility] on 1/8 to see specialist  DMII - SSI  Best Practice (right click and Reselect all SmartList Selections daily)   Diet/type: NPO w/ oral meds DVT prophylaxis prophylactic heparin   Pressure ulcer(s): N/A GI prophylaxis: N/A Lines: N/A Foley:  N/A Code Status:  DNR Last date of multidisciplinary goals of care discussion [1/4 updated wife via phone, confirms DNR status]  Labs   CBC: Recent Labs  Lab 07/14/23 1847  WBC 24.3*  NEUTROABS 21.7*  HGB 10.1*  HCT 29.2*  MCV 86.4  PLT 263    Basic Metabolic Panel: Recent Labs  Lab 07/14/23 1847  NA 134*  K 4.5  CL 100  CO2 16*  GLUCOSE 169*  BUN 48*  CREATININE 1.96*  CALCIUM  9.6   GFR: Estimated Creatinine Clearance: 23.9 mL/min (A) (by C-G formula based on SCr of 1.96 mg/dL (H)). Recent Labs  Lab 07/14/23 1847 07/14/23 1914 07/14/23 2005  WBC 24.3*  --   --   LATICACIDVEN  --  5.7* 5.6*    Liver Function Tests: Recent Labs  Lab 07/14/23 1847  AST 221*  ALT 192*  ALKPHOS 178*  BILITOT 2.5*  PROT 6.7  ALBUMIN 3.2*   Recent Labs  Lab 07/14/23 1847  LIPASE 43   No results for input(s): AMMONIA in the last 168 hours.  ABG    Component Value Date/Time   PHART 7.243 (L) 02/21/2023 1618   PCO2ART 43.0 02/21/2023 1618   PO2ART 147  (H) 02/21/2023 1618   HCO3 18.6 (L) 02/21/2023 1618   TCO2 20 (L) 02/21/2023 1618   ACIDBASEDEF 8.0 (H) 02/21/2023 1618   O2SAT 99 02/21/2023 1618     Coagulation Profile: No results for input(s): INR, PROTIME in the last 168 hours.  Cardiac Enzymes: No results for input(s): CKTOTAL, CKMB, CKMBINDEX, TROPONINI in the last 168 hours.  HbA1C: Hgb A1c MFr Bld  Date/Time Value Ref Range Status  02/21/2023 09:25 AM 6.1 (H) 4.8 - 5.6 % Final    Comment:    (NOTE)         Prediabetes: 5.7 - 6.4         Diabetes: >6.4         Glycemic control for adults with diabetes: <7.0   04/10/2013  12:05 PM 6.9 (H) <5.7 % Final    Comment:    (NOTE)                                                                       According to the ADA Clinical Practice Recommendations for 2011, when HbA1c is used as a screening test:  >=6.5%   Diagnostic of Diabetes Mellitus           (if abnormal result is confirmed) 5.7-6.4%   Increased risk of developing Diabetes Mellitus References:Diagnosis and Classification of Diabetes Mellitus,Diabetes Care,2011,34(Suppl 1):S62-S69 and Standards of Medical Care in         Diabetes - 2011,Diabetes Care,2011,34 (Suppl 1):S11-S61.    CBG: No results for input(s): GLUCAP in the last 168 hours.  Review of Systems:   Review of Systems  Constitutional:  Positive for malaise/fatigue. Negative for chills, fever and weight loss.  HENT:  Negative for congestion, sinus pain and sore throat.   Eyes: Negative.   Respiratory:  Negative for cough, hemoptysis, sputum production, shortness of breath and wheezing.   Cardiovascular:  Negative for chest pain, palpitations, orthopnea, claudication and leg swelling.  Gastrointestinal:  Negative for abdominal pain, heartburn, nausea and vomiting.  Genitourinary: Negative.   Musculoskeletal:  Negative for joint pain and myalgias.  Skin:  Negative for rash.  Neurological:  Positive for weakness.  Endo/Heme/Allergies:  Negative.   Psychiatric/Behavioral: Negative.     Past Medical History:  He,  has a past medical history of Allergy, Aortic stenosis, Arthritis, Balance problem (04/26/2022), Blood transfusion without reported diagnosis, CAD (coronary artery disease), Cataract, Diabetes mellitus, type 2 (HCC), Diverticulosis of colon (without mention of hemorrhage), Duodenal ulcer perforation (HCC), Flesh-eating bacteria (HCC) (1995), GERD (gastroesophageal reflux disease), Glaucoma, Heart murmur, History of diabetic retinopathy, History of gastrointestinal bleeding, History of necrotizing fasciitis, History of prosthetic aortic valve, Hypercholesteremia, Myocardial infarction (HCC) (02/23/2016), Neuropathic pain (04/26/2022), Nightmares, OSA (obstructive sleep apnea), Personal history of colonic polyps (11/23/2009), Postoperative anemia, Sinus bradycardia, Sleep apnea, Stomach ulcer, Systolic hypertension, and Vitamin B12 deficiency.   Surgical History:   Past Surgical History:  Procedure Laterality Date   AORTIC VALVE REPLACEMENT     BLEPHAROPLASTY  11/24/2014   CARDIAC CATHETERIZATION N/A 03/01/2016   Procedure: Coronary/Graft Angiography;  Surgeon: Peter M Jordan, MD;  Location: [facility] INVASIVE CV LAB;  Service: Cardiovascular;  Laterality: N/A;   CATARACT EXTRACTION W/ INTRAOCULAR LENS IMPLANT     OD   COLONOSCOPY     CORONARY ARTERY BYPASS GRAFT  2010   x 4   flesh eating disease surgery      surgeries x 7    HERNIA REPAIR  1972   IR EXCHANGE BILIARY DRAIN  03/07/2023   IR EXCHANGE BILIARY DRAIN  04/18/2023   IR PERC CHOLECYSTOSTOMY  03/02/2023   POLYPECTOMY     RETINAL LASER PROCEDURE     SCROTAL SURGERY  1995   resection   UMBILICAL HERNIA REPAIR N/A 04/10/2013   open UHR w Ventralex mesh patch - Dr Gladis     Social History:   reports that he has never smoked. He has never used smokeless tobacco. He reports that he does not drink alcohol  and does not use drugs.   Family History:  His family  history includes Alcohol  abuse in his father; Cirrhosis in his father; Colon cancer (age of onset: 37) in his mother; Diabetes in his mother and sister; Hypertension in his mother and sister; Rectal cancer in his mother. There is no history of Heart attack, Stroke, Colon polyps, Esophageal cancer, or Stomach cancer.   Allergies Allergies  Allergen Reactions   Shellfish-Derived Products Anaphylaxis   Nsaids Other (See Comments)    H/o gastric ulcers = NO NSAIDs per GI   Zolpidem Tartrate     Other Reaction(s): Other (See Comments)  Hallucinations     Home Medications  Prior to Admission medications   Medication Sig Start Date End Date Taking? Authorizing Provider  acetaminophen  (TYLENOL ) 500 MG tablet Take 500 mg by mouth daily as needed for moderate pain.    [provider]  allopurinol  (ZYLOPRIM ) 300 MG tablet Take 300 mg by mouth daily.    [provider]  ascorbic acid  (VITAMIN C ) 500 MG tablet Take by mouth daily.    [provider]  atorvastatin  (LIPITOR) 80 MG tablet Take 1 tablet (80 mg total) by mouth daily. 03/13/23   Fairy Frames, MD  Cholecalciferol (VITAMIN D-3 PO) Take 1 capsule by mouth in the morning and at bedtime.    [provider]  Coenzyme Q10 (COQ10) 100 MG CAPS Take 100 mg by mouth daily.    [provider]  Cyanocobalamin  (VITAMIN B-12 IJ) Inject 1,000 mcg into the muscle every 30 (thirty) days. Amt/dose is unknown    [provider]  cycloSPORINE (RESTASIS) 0.05 % ophthalmic emulsion Place 1 drop into both eyes 2 (two) times daily.    [provider]  empagliflozin  (JARDIANCE ) 10 MG TABS tablet Take 1 tablet (10 mg total) by mouth daily. 04/21/23   Wyn Jackee VEAR Mickey., NP  fish oil-omega-3 fatty acids 1000 MG capsule Take by mouth daily.    [provider]  fluorouracil (EFUDEX) 5 % cream Apply topically daily. 06/10/22   [provider]  glipiZIDE (GLUCOTROL) 5 MG tablet Take 5 mg by  mouth every morning.    [provider]  latanoprost  (XALATAN ) 0.005 % ophthalmic solution Place 1 drop into both eyes at bedtime. 03/31/23   [provider]  metFORMIN (GLUCOPHAGE) 1000 MG tablet Take 1,000 mg by mouth in the morning and at bedtime.    [provider]  metoprolol  succinate (TOPROL -XL) 50 MG 24 hr tablet Take 1 tablet (50 mg total) by mouth daily. Take with or immediately following a meal. 03/14/23   Fairy Frames, MD  minoxidil (LONITEN) 10 MG tablet Take 10 mg by mouth daily.  07/20/15   [provider]  Multiple Vitamin (MULTIVITAMIN) tablet Take 1 tablet by mouth daily.    [provider]  nitroGLYCERIN  (NITROSTAT ) 0.4 MG SL tablet Place 1 tablet under the tongue as needed. 09/01/16   [provider]  saccharomyces boulardii (FLORASTOR) 250 MG capsule Take 1 capsule (250 mg total) by mouth 2 (two) times daily. 03/13/23   Fairy Frames, MD  spironolactone  (ALDACTONE ) 25 MG tablet Take 0.5 tablets (12.5 mg total) by mouth daily. 04/21/23   Wyn Jackee VEAR Mickey., NP  trolamine salicylate (ASPERCREME) 10 % cream Apply 1 application  topically as needed for muscle pain.    [provider]  VITAMIN A PO Take by mouth daily.    [provider]     Critical care time: 45 minutes    Dorn Chill, MD [facility] Office: [phone]   See Amion for personal pager PCCM on call pager [phone] until 7pm. Please call Elink 7p-7a. [phone]

## 2023-07-15 NOTE — ED Notes (Signed)
 Contacted Wife, Gavin Pound, to inform of room change.

## 2023-07-15 NOTE — Progress Notes (Signed)
 07/15/2023  Nathan [name] [id number] 1945-08-05  CARE TEAM: PCP: Yolande Toribio MATSU, MD  Outpatient Care Team: Patient Care Team: Yolande Toribio MATSU, MD as PCP - General (Internal Medicine) Jeffrie Oneil BROCKS, MD as PCP - Cardiology (Cardiology) Rubin Calamity, MD as Consulting Physician (General Surgery) Pyrtle, Gordy HERO, MD as Consulting Physician (Gastroenterology) Dohmeier, Dedra, MD as Consulting Physician (Neurology)  Inpatient Treatment Team: Treatment Team:  Kassie Acquanetta Bradley, MD Ccs, Md, MD Pccm, Md, MD Margrette Candy DASEN, RN Bethene Stefano POUR, [facility] Melonie Norleen BROCKS, RN Leila Knee, RN   Problem List:   Principal Problem:   Sepsis [facility]) Active Problems:   Type 2 diabetes mellitus with hyperlipidemia [facility])   Anxiety state   Balance problem   Abdominal pain   Tachycardia   Elevated LFTs   S/P AVR   Acute on chronic systolic CHF (congestive heart failure) (HCC)   Acute kidney injury on CKD stage 3a, GFR 45-59 ml/min (HCC)   CKD stage 3a, GFR 45-59 ml/min (HCC)   * No surgery found *      Assessment [facility] Stay = 0 days)      Sepsis -multiple potential etiologies but suspect recurrent cholecystitis    Plan:  -IV antibiotics  Bowel rest  HIDA scan today.  If cystic duct obstruction is suspected, would recommend percutaneous cholecystostomy tube.  Ideally today if possible.  Not a candidate for any abdominal surgery at this time given his deconditioned state, cardiac issues/CHF/MI August 2024, on pressors.  -VTE prophylaxis- SCDs, etc  -mobilize as tolerated to help recovery  -Disposition: TBD       I reviewed nursing notes, ED provider notes, Consultant CCM notes, last 24 h vitals and pain scores, last 48 h intake and output, last 24 h labs and trends, and last 24 h imaging results.  I have reviewed this patient's available data, including medical history, events of note, test results, etc as part of my evaluation.   A significant  portion of that time was spent in counseling. Care during the described time interval was provided by me.  This care required high  level of medical decision making.  07/15/2023    Subjective: (Chief complaint)  In ICU.  Started on Levophed .  Stabilized and weaning down.  Patient still very sleepy and not very talkative.  Has chronic dysarthria  Objective:  Vital signs:  Vitals:   07/15/23 0530 07/15/23 0545 07/15/23 0600 07/15/23 0615  BP: 109/68 101/61 (!) 95/53 105/62  Pulse: (!) 109 99 (!) 102 (!) 101  Resp:      Temp:      TempSrc:      SpO2: 95% 95% 95% 95%  Weight:      Height:           Intake/Output   Yesterday:  01/03 0701 - 01/04 0700 In: 386.4 [P.O.:30; I.V.:177.3; IV Piggyback:179.1] Out: -  This shift:  No intake/output data recorded.  Bowel function:  Flatus: No  BM:  No  Drain: (No drain)   Physical Exam:  General: Pt resting.  No obvious delirium.  Not particularly interactive.  Can follow a few commands  Eyes: PERRL,  Sclera clear.  No icterus Lymph: No head/neck/groin lymphadenopathy HENT: Normocephalic, Mucus membranes moist.  No thrush Neck: Supple, No tracheal deviation.  No obvious thyromegaly Chest: No pain to chest wall compression.  Good respiratory excursion.  No audible wheezing CV:  Pulses intact.  Regular rhythm.  No major extremity edema MS: Normal AROM mjr  joints.  No obvious deformity  Abdomen: Soft.  Mildy distended.  Tenderness at right upper quadrant .  No evidence of peritonitis.  No incarcerated hernias.  Ext:   No deformity.  No mjr edema.  No cyanosis Skin: No petechiae / purpurea.  No major sores.  Warm and dry    Results:   Cultures: Recent Results (from the past 720 hours)  Resp panel by RT-PCR (RSV, Flu A&B, Covid) Anterior Nasal Swab     Status: None   Collection Time: 07/14/23  7:01 PM   Specimen: Anterior Nasal Swab  Result Value Ref Range Status   SARS Coronavirus 2 by RT PCR NEGATIVE NEGATIVE  Final    Comment: (NOTE) SARS-CoV-2 target nucleic acids are NOT DETECTED.  The SARS-CoV-2 RNA is generally detectable in upper respiratory specimens during the acute phase of infection. The lowest concentration of SARS-CoV-2 viral copies this assay can detect is 138 copies/mL. A negative result does not preclude SARS-Cov-2 infection and should not be used as the sole basis for treatment or other patient management decisions. A negative result may occur with  improper specimen collection/handling, submission of specimen other than nasopharyngeal swab, presence of viral mutation(s) within the areas targeted by this assay, and inadequate number of viral copies(<138 copies/mL). A negative result must be combined with clinical observations, patient history, and epidemiological information. The expected result is Negative.  Fact Sheet for Patients:  bloggercourse.com  Fact Sheet for Healthcare Providers:  seriousbroker.it  This test is no t yet approved or cleared by the [place]  FDA and  has been authorized for detection and/or diagnosis of SARS-CoV-2 by FDA under an Emergency Use Authorization (EUA). This EUA will remain  in effect (meaning this test can be used) for the duration of the COVID-19 declaration under Section 564(b)(1) of the Act, 21 U.S.C.section 360bbb-3(b)(1), unless the authorization is terminated  or revoked sooner.       Influenza A by PCR NEGATIVE NEGATIVE Final   Influenza B by PCR NEGATIVE NEGATIVE Final    Comment: (NOTE) The Xpert Xpress SARS-CoV-2/FLU/RSV plus assay is intended as an aid in the diagnosis of influenza from Nasopharyngeal swab specimens and should not be used as a sole basis for treatment. Nasal washings and aspirates are unacceptable for Xpert Xpress SARS-CoV-2/FLU/RSV testing.  Fact Sheet for Patients: bloggercourse.com  Fact Sheet for Healthcare  Providers: seriousbroker.it  This test is not yet approved or cleared by the [place]  FDA and has been authorized for detection and/or diagnosis of SARS-CoV-2 by FDA under an Emergency Use Authorization (EUA). This EUA will remain in effect (meaning this test can be used) for the duration of the COVID-19 declaration under Section 564(b)(1) of the Act, 21 U.S.C. section 360bbb-3(b)(1), unless the authorization is terminated or revoked.     Resp Syncytial Virus by PCR NEGATIVE NEGATIVE Final    Comment: (NOTE) Fact Sheet for Patients: bloggercourse.com  Fact Sheet for Healthcare Providers: seriousbroker.it  This test is not yet approved or cleared by the [place]  FDA and has been authorized for detection and/or diagnosis of SARS-CoV-2 by FDA under an Emergency Use Authorization (EUA). This EUA will remain in effect (meaning this test can be used) for the duration of the COVID-19 declaration under Section 564(b)(1) of the Act, 21 U.S.C. section 360bbb-3(b)(1), unless the authorization is terminated or revoked.  Performed at [facility], [address] [place], [place] [address]   MRSA Next Gen by PCR, Nasal     Status:  None   Collection Time: 07/15/23  2:09 AM   Specimen: Nasal Mucosa; Nasal Swab  Result Value Ref Range Status   MRSA by PCR Next Gen NOT DETECTED NOT DETECTED Final    Comment: (NOTE) The GeneXpert MRSA Assay (FDA approved for NASAL specimens only), is one component of a comprehensive MRSA colonization surveillance program. It is not intended to diagnose MRSA infection nor to guide or monitor treatment for MRSA infections. Test performance is not FDA approved in patients less than 97 years old. Performed at [facility], [address] [place], [place] [address]     Labs: Results for orders placed or performed during the hospital  encounter of 07/14/23 (from the past 48 hours)  CBC with Differential     Status: Abnormal   Collection Time: 07/14/23  6:47 PM  Result Value Ref Range   WBC 24.3 (H) 4.0 - 10.5 K/uL   RBC 3.38 (L) 4.22 - 5.81 MIL/uL   Hemoglobin 10.1 (L) 13.0 - 17.0 g/dL   HCT 70.7 (L) 60.9 - 47.9 %   MCV 86.4 80.0 - 100.0 fL   MCH 29.9 26.0 - 34.0 pg   MCHC 34.6 30.0 - 36.0 g/dL   RDW 82.7 (H) 88.4 - 84.4 %   Platelets 263 150 - 400 K/uL   nRBC 0.0 0.0 - 0.2 %   Neutrophils Relative % 89 %   Neutro Abs 21.7 (H) 1.7 - 7.7 K/uL   Lymphocytes Relative 3 %   Lymphs Abs 0.8 0.7 - 4.0 K/uL   Monocytes Relative 7 %   Monocytes Absolute 1.6 (H) 0.1 - 1.0 K/uL   Eosinophils Relative 0 %   Eosinophils Absolute 0.0 0.0 - 0.5 K/uL   Basophils Relative 0 %   Basophils Absolute 0.0 0.0 - 0.1 K/uL   Immature Granulocytes 1 %   Abs Immature Granulocytes 0.20 (H) 0.00 - 0.07 K/uL    Comment: Performed at [facility], [address] [place], [place] [address]  Comprehensive metabolic panel     Status: Abnormal   Collection Time: 07/14/23  6:47 PM  Result Value Ref Range   Sodium 134 (L) 135 - 145 mmol/L   Potassium 4.5 3.5 - 5.1 mmol/L   Chloride 100 98 - 111 mmol/L   CO2 16 (L) 22 - 32 mmol/L   Glucose, Bld 169 (H) 70 - 99 mg/dL    Comment: Glucose reference range applies only to samples taken after fasting for at least 8 hours.   BUN 48 (H) 8 - 23 mg/dL   Creatinine, Ser 8.03 (H) 0.61 - 1.24 mg/dL   Calcium  9.6 8.9 - 10.3 mg/dL   Total Protein 6.7 6.5 - 8.1 g/dL   Albumin 3.2 (L) 3.5 - 5.0 g/dL   AST 778 (H) 15 - 41 U/L   ALT 192 (H) 0 - 44 U/L   Alkaline Phosphatase 178 (H) 38 - 126 U/L   Total Bilirubin 2.5 (H) 0.0 - 1.2 mg/dL   GFR, Estimated 34 (L) >60 mL/min    Comment: (NOTE) Calculated using the CKD-EPI Creatinine Equation (2021)    Anion gap 18 (H) 5 - 15    Comment: Performed at [facility], [address], [place], [place] [address]  Troponin I  (High Sensitivity)     Status: Abnormal   Collection Time: 07/14/23  6:47 PM  Result Value Ref Range   Troponin I (High Sensitivity) 18 (H) <18 ng/L    Comment: (  NOTE) Elevated high sensitivity troponin I (hsTnI) values and significant  changes across serial measurements may suggest ACS but many other  chronic and acute conditions are known to elevate hsTnI results.  Refer to the Links section for chest pain algorithms and additional  guidance. Performed at [facility], [address], [place], [place] [address]   Lipase, blood     Status: None   Collection Time: 07/14/23  6:47 PM  Result Value Ref Range   Lipase 43 11 - 51 U/L    Comment: Performed at [facility], [address] [place], [place] [address]  Brain natriuretic peptide     Status: Abnormal   Collection Time: 07/14/23  6:47 PM  Result Value Ref Range   B Natriuretic Peptide 2,349.7 (H) 0.0 - 100.0 pg/mL    Comment: Performed at [facility], [address] [place], [place] [address]  Resp panel by RT-PCR (RSV, Flu A&B, Covid) Anterior Nasal Swab     Status: None   Collection Time: 07/14/23  7:01 PM   Specimen: Anterior Nasal Swab  Result Value Ref Range   SARS Coronavirus 2 by RT PCR NEGATIVE NEGATIVE    Comment: (NOTE) SARS-CoV-2 target nucleic acids are NOT DETECTED.  The SARS-CoV-2 RNA is generally detectable in upper respiratory specimens during the acute phase of infection. The lowest concentration of SARS-CoV-2 viral copies this assay can detect is 138 copies/mL. A negative result does not preclude SARS-Cov-2 infection and should not be used as the sole basis for treatment or other patient management decisions. A negative result may occur with  improper specimen collection/handling, submission of specimen other than nasopharyngeal swab, presence of viral mutation(s) within the areas targeted by this assay, and inadequate number of  viral copies(<138 copies/mL). A negative result must be combined with clinical observations, patient history, and epidemiological information. The expected result is Negative.  Fact Sheet for Patients:  bloggercourse.com  Fact Sheet for Healthcare Providers:  seriousbroker.it  This test is no t yet approved or cleared by the [place]  FDA and  has been authorized for detection and/or diagnosis of SARS-CoV-2 by FDA under an Emergency Use Authorization (EUA). This EUA will remain  in effect (meaning this test can be used) for the duration of the COVID-19 declaration under Section 564(b)(1) of the Act, 21 U.S.C.section 360bbb-3(b)(1), unless the authorization is terminated  or revoked sooner.       Influenza A by PCR NEGATIVE NEGATIVE   Influenza B by PCR NEGATIVE NEGATIVE    Comment: (NOTE) The Xpert Xpress SARS-CoV-2/FLU/RSV plus assay is intended as an aid in the diagnosis of influenza from Nasopharyngeal swab specimens and should not be used as a sole basis for treatment. Nasal washings and aspirates are unacceptable for Xpert Xpress SARS-CoV-2/FLU/RSV testing.  Fact Sheet for Patients: bloggercourse.com  Fact Sheet for Healthcare Providers: seriousbroker.it  This test is not yet approved or cleared by the [place]  FDA and has been authorized for detection and/or diagnosis of SARS-CoV-2 by FDA under an Emergency Use Authorization (EUA). This EUA will remain in effect (meaning this test can be used) for the duration of the COVID-19 declaration under Section 564(b)(1) of the Act, 21 U.S.C. section 360bbb-3(b)(1), unless the authorization is terminated or revoked.     Resp Syncytial Virus by PCR NEGATIVE NEGATIVE    Comment: (NOTE) Fact Sheet for Patients: bloggercourse.com  Fact Sheet for Healthcare  Providers: seriousbroker.it  This test is not yet approved or cleared  by the [place]  FDA and has been authorized for detection and/or diagnosis of SARS-CoV-2 by FDA under an Emergency Use Authorization (EUA). This EUA will remain in effect (meaning this test can be used) for the duration of the COVID-19 declaration under Section 564(b)(1) of the Act, 21 U.S.C. section 360bbb-3(b)(1), unless the authorization is terminated or revoked.  Performed at [facility], [address], [name], [place] [address]   I-Stat CG4 Lactic Acid     Status: Abnormal   Collection Time: 07/14/23  7:14 PM  Result Value Ref Range   Lactic Acid, Venous 5.7 (HH) 0.5 - 1.9 mmol/L   Comment NOTIFIED PHYSICIAN   Troponin I (High Sensitivity)     Status: None   Collection Time: 07/14/23  7:42 PM  Result Value Ref Range   Troponin I (High Sensitivity) 16 <18 ng/L    Comment: (NOTE) Elevated high sensitivity troponin I (hsTnI) values and significant  changes across serial measurements may suggest ACS but many other  chronic and acute conditions are known to elevate hsTnI results.  Refer to the Links section for chest pain algorithms and additional  guidance. Performed at [facility], [address] [place], [place] [address]   I-Stat CG4 Lactic Acid     Status: Abnormal   Collection Time: 07/14/23  8:05 PM  Result Value Ref Range   Lactic Acid, Venous 5.6 (HH) 0.5 - 1.9 mmol/L   Comment NOTIFIED PHYSICIAN   Urinalysis, w/ Reflex to Culture (Infection Suspected) -Urine, Clean Catch     Status: Abnormal   Collection Time: 07/14/23  9:10 PM  Result Value Ref Range   Specimen Source URINE, CLEAN CATCH    Color, Urine AMBER (A) YELLOW    Comment: BIOCHEMICALS MAY BE AFFECTED BY COLOR   APPearance HAZY (A) CLEAR   Specific Gravity, Urine 1.025 1.005 - 1.030   pH 5.0 5.0 - 8.0   Glucose, UA NEGATIVE NEGATIVE mg/dL   Hgb urine dipstick  NEGATIVE NEGATIVE   Bilirubin Urine NEGATIVE NEGATIVE   Ketones, ur NEGATIVE NEGATIVE mg/dL   Protein, ur NEGATIVE NEGATIVE mg/dL   Nitrite NEGATIVE NEGATIVE   Leukocytes,Ua NEGATIVE NEGATIVE   RBC / HPF 0-5 0 - 5 RBC/hpf   WBC, UA 0-5 0 - 5 WBC/hpf    Comment:        Reflex urine culture not performed if WBC <=10, OR if Squamous epithelial cells >5. If Squamous epithelial cells >5 suggest recollection.    Bacteria, UA NONE SEEN NONE SEEN   Squamous Epithelial / HPF 0-5 0 - 5 /HPF   Mucus PRESENT    Hyaline Casts, UA PRESENT     Comment: Performed at [facility], [address] [place], [place] [address]  Protime-INR     Status: Abnormal   Collection Time: 07/14/23 11:51 PM  Result Value Ref Range   Prothrombin Time 18.5 (H) 11.4 - 15.2 seconds   INR 1.5 (H) 0.8 - 1.2    Comment: (NOTE) INR goal varies based on device and disease states. Performed at [facility], [address] [place], [place] [address]   Cortisol     Status: None   Collection Time: 07/15/23 12:27 AM  Result Value Ref Range   Cortisol, Plasma 47.5 ug/dL    Comment: (NOTE) AM    6.7 - 22.6 ug/dL PM   <89.9       ug/dL Performed at [facility], [address], [place], [place] [address]   MRSA Next Gen by PCR, Nasal     Status: None   Collection Time: 07/15/23  2:09 AM   Specimen: Nasal Mucosa; Nasal Swab  Result Value Ref Range   MRSA by PCR Next Gen NOT DETECTED NOT DETECTED    Comment: (NOTE) The GeneXpert MRSA Assay (FDA approved for NASAL specimens only), is one component of a comprehensive MRSA colonization surveillance program. It is not intended to diagnose MRSA infection nor to guide or monitor treatment for MRSA infections. Test performance is not FDA approved in patients less than 1 years old. Performed at [facility], [address], [place], [place] [address]   Type and screen If need to transfuse blood products  please use the blood administration order set     Status: None   Collection Time: 07/15/23  2:56 AM  Result Value Ref Range   ABO/RH(D) A POS    Antibody Screen NEG    Sample Expiration      07/18/2023,2359 Performed at [facility], [address], [place], [place] [address]   CBC     Status: Abnormal   Collection Time: 07/15/23  2:57 AM  Result Value Ref Range   WBC 27.0 (H) 4.0 - 10.5 K/uL   RBC 3.26 (L) 4.22 - 5.81 MIL/uL   Hemoglobin 9.3 (L) 13.0 - 17.0 g/dL   HCT 70.7 (L) 60.9 - 47.9 %   MCV 89.6 80.0 - 100.0 fL   MCH 28.5 26.0 - 34.0 pg   MCHC 31.8 30.0 - 36.0 g/dL   RDW 82.1 (H) 88.4 - 84.4 %   Platelets 284 150 - 400 K/uL   nRBC 0.0 0.0 - 0.2 %    Comment: Performed at [facility], [address], [place], [place] [address]  Comprehensive metabolic panel     Status: Abnormal   Collection Time: 07/15/23  2:57 AM  Result Value Ref Range   Sodium 130 (L) 135 - 145 mmol/L   Potassium 4.6 3.5 - 5.1 mmol/L   Chloride 99 98 - 111 mmol/L   CO2 15 (L) 22 - 32 mmol/L   Glucose, Bld 157 (H) 70 - 99 mg/dL    Comment: Glucose reference range applies only to samples taken after fasting for at least 8 hours.   BUN 50 (H) 8 - 23 mg/dL   Creatinine, Ser 8.17 (H) 0.61 - 1.24 mg/dL   Calcium  8.9 8.9 - 10.3 mg/dL   Total Protein 6.1 (L) 6.5 - 8.1 g/dL   Albumin 2.9 (L) 3.5 - 5.0 g/dL   AST 848 (H) 15 - 41 U/L   ALT 153 (H) 0 - 44 U/L   Alkaline Phosphatase 149 (H) 38 - 126 U/L   Total Bilirubin 3.5 (H) 0.0 - 1.2 mg/dL   GFR, Estimated 38 (L) >60 mL/min    Comment: (NOTE) Calculated using the CKD-EPI Creatinine Equation (2021)    Anion gap 16 (H) 5 - 15    Comment: Performed at [facility], [address], [place], [place] [address]  Lipase, blood     Status: Abnormal   Collection Time: 07/15/23  2:57 AM  Result Value Ref Range   Lipase 133 (H) 11 - 51 U/L    Comment: Performed at [facility], [address] [place], [place] [address]  Magnesium      Status: Abnormal   Collection Time: 07/15/23  2:57 AM  Result Value Ref  Range   Magnesium  1.4 (L) 1.7 - 2.4 mg/dL    Comment: Performed at [facility], [address], [place], [place] [address]  Phosphorus     Status: None   Collection Time: 07/15/23  2:57 AM  Result Value Ref Range   Phosphorus 3.7 2.5 - 4.6 mg/dL    Comment: Performed at [facility], [address], [place], [place] [address]  Procalcitonin     Status: None   Collection Time: 07/15/23  2:57 AM  Result Value Ref Range   Procalcitonin 3.86 ng/mL    Comment:        Interpretation: PCT > 2 ng/mL: Systemic infection (sepsis) is likely, unless other causes are known. (NOTE)       Sepsis PCT Algorithm           Lower Respiratory Tract                                      Infection PCT Algorithm    ----------------------------     ----------------------------         PCT < 0.25 ng/mL                PCT < 0.10 ng/mL          Strongly encourage             Strongly discourage   discontinuation of antibiotics    initiation of antibiotics    ----------------------------     -----------------------------       PCT 0.25 - 0.50 ng/mL            PCT 0.10 - 0.25 ng/mL               OR       >80% decrease in PCT            Discourage initiation of                                            antibiotics      Encourage discontinuation           of antibiotics    ----------------------------     -----------------------------         PCT >= 0.50 ng/mL              PCT 0.26 - 0.50 ng/mL               AND       <80% decrease in PCT              Encourage initiation of                                             antibiotics       Encourage continuation           of antibiotics    ----------------------------     -----------------------------        PCT >= 0.50 ng/mL                  PCT > 0.50 ng/mL  AND         increase in  PCT                  Strongly encourage                                      initiation of antibiotics    Strongly encourage escalation           of antibiotics                                     -----------------------------                                           PCT <= 0.25 ng/mL                                                 OR                                        > 80% decrease in PCT                                      Discontinue / Do not initiate                                             antibiotics  Performed at [facility], [address], [place], [place] [address]   Protime-INR     Status: Abnormal   Collection Time: 07/15/23  2:57 AM  Result Value Ref Range   Prothrombin Time 18.5 (H) 11.4 - 15.2 seconds   INR 1.5 (H) 0.8 - 1.2    Comment: (NOTE) INR goal varies based on device and disease states. Performed at [facility], [address] [place], [place] [address]   Potassium     Status: None   Collection Time: 07/15/23  2:59 AM  Result Value Ref Range   Potassium 4.8 3.5 - 5.1 mmol/L    Comment: Performed at [facility], [address], [place], [place] [address]  Magnesium      Status: Abnormal   Collection Time: 07/15/23  2:59 AM  Result Value Ref Range   Magnesium  1.4 (L) 1.7 - 2.4 mg/dL    Comment: Performed at [facility], [address] [place], [place] [address]  Phosphorus     Status: None   Collection Time: 07/15/23  2:59 AM  Result Value Ref Range   Phosphorus 3.6 2.5 - 4.6 mg/dL    Comment: Performed at [facility], [address], [place], [place] [address]  Glucose, capillary     Status: Abnormal   Collection Time: 07/15/23  3:50 AM  Result Value Ref Range   Glucose-Capillary 166 (  H) 70 - 99 mg/dL    Comment: Glucose reference range applies only to samples taken after fasting for at least 8 hours.  Lactic acid, plasma     Status:  Abnormal   Collection Time: 07/15/23  4:52 AM  Result Value Ref Range   Lactic Acid, Venous 3.7 (HH) 0.5 - 1.9 mmol/L    Comment: CRITICAL RESULT CALLED TO, READ BACK BY AND VERIFIED WITH TAYLOR T. @ 0551 07/15/23 MCLEAN K. Performed at [facility], [address] [place], [place] [address]     Imaging / Studies: CT ABDOMEN PELVIS W CONTRAST Result Date: 07/14/2023 CLINICAL DATA:  Weakness and possible sepsis EXAM: CT ABDOMEN AND PELVIS WITH CONTRAST TECHNIQUE: Multidetector CT imaging of the abdomen and pelvis was performed using the standard protocol following bolus administration of intravenous contrast. RADIATION DOSE REDUCTION: This exam was performed according to the departmental dose-optimization program which includes automated exposure control, adjustment of the mA and/or kV according to patient size and/or use of iterative reconstruction technique. CONTRAST:  75mL OMNIPAQUE  IOHEXOL  300 MG/ML  SOLN COMPARISON:  03/01/2023 FINDINGS: Lower chest: Mild interstitial edema is noted. No sizable effusion is seen. Hepatobiliary: Liver is within normal limits. Gallbladder is well distended with dependent calculus. Significant wall thickening and edema is noted consistent with acute cholecystitis till proven otherwise. Ultrasound may be helpful for further evaluation. The common bile duct is prominent measuring up to 11 mm greater than that expected for the patient's given age. No definitive distal CBD calculus is seen. Pancreas: Unremarkable. No pancreatic ductal dilatation or surrounding inflammatory changes. Spleen: Calcified granulomas are noted throughout the spleen. Adrenals/Urinary Tract: Adrenal glands are within normal limits. Bilateral renal cysts are noted which appear simple in nature and stable from previous exam. No follow-up is recommended. No renal calculi or obstructive changes are seen. The bladder is decompressed. Stomach/Bowel: Scattered diverticular change of the  colon is noted. No evidence of diverticulitis is seen. The appendix appears within normal limits. Small bowel and stomach are unremarkable. Vascular/Lymphatic: Aortic atherosclerosis. No enlarged abdominal or pelvic lymph nodes. Reproductive: Prostate is unremarkable. Other: Mild free fluid is noted within the abdomen and pelvis. Musculoskeletal: Degenerative changes of lumbar spine are noted. IMPRESSION: Inflammatory changes involving the gallbladder consistent with acute cholecystitis. Ultrasound may be helpful for further evaluation as necessary. Common bile duct dilatation is seen although no discrete distal calculus is noted. Diverticulosis without diverticulitis. Mild free fluid in the abdomen. Electronically Signed   By: Oneil Devonshire M.D.   On: 07/14/2023 21:57   DG Chest Portable 1 View Result Date: 07/14/2023 CLINICAL DATA:  Generalized weakness. EXAM: PORTABLE CHEST 1 VIEW COMPARISON:  X-ray 02/28/2023.  CT 03/01/2023.  Older exams as well. FINDINGS: Underinflation. Enlarged cardiac silhouette with calcified aorta. Sternal wires. Prosthetic valves. Slight interstitial changes identified with some subtle opacity left lung base. No pneumothorax or effusion. Old left-sided rib fractures. IMPRESSION: Underinflation. Postop chest. Enlarged cardiopericardial silhouette. Slight interstitial changes. Mild opacity left lung base. Recommend follow-up. Electronically Signed   By: Ranell Bring M.D.   On: 07/14/2023 19:39    Medications / Allergies: per chart  Antibiotics: Anti-infectives (From admission, onward)    Start     Dose/Rate Route Frequency Ordered Stop   07/15/23 1000  linezolid  (ZYVOX ) IVPB 600 mg        600 mg 300 mL/hr over 60 Minutes Intravenous Every 12 hours 07/15/23 0217     07/15/23 0600  piperacillin -tazobactam (ZOSYN ) IVPB 3.375 g  3.375 g 12.5 mL/hr over 240 Minutes Intravenous Every 8 hours 07/14/23 2230 07/20/23 0559   07/14/23 2215  metroNIDAZOLE  (FLAGYL ) IVPB 500 mg         500 mg 100 mL/hr over 60 Minutes Intravenous  Once 07/14/23 2208 07/14/23 2336   07/14/23 1945  vancomycin  (VANCOCIN ) IVPB 1000 mg/200 mL premix        1,000 mg 200 mL/hr over 60 Minutes Intravenous STAT 07/14/23 1937 07/14/23 2153   07/14/23 1915  ceFEPIme  (MAXIPIME ) 2 g in sodium chloride  0.9 % 100 mL IVPB        2 g 200 mL/hr over 30 Minutes Intravenous  Once 07/14/23 1906 07/14/23 2013         Note: Portions of this report may have been transcribed using voice recognition software. Every effort was made to ensure accuracy; however, inadvertent computerized transcription errors may be present.   Any transcriptional errors that result from this process are unintentional.    Elspeth KYM Schultze, MD, FACS, MASCRS Esophageal, Gastrointestinal & Colorectal Surgery Robotic and Minimally Invasive Surgery  [facility] [address], [address] [place], [place] [address] [phone] Fax [phone] Main  CONTACT INFORMATION: Weekday (9AM-5PM): Call [facility] main office at [phone] Weeknight (5PM-9AM) or Weekend/Holiday: Check EPIC Web Links tab & use AMION (password  [email or web address]) for General Surgery [facility] coverage  Please, DO NOT use SecureChat  (it is not reliable communication to reach operating surgeons & will lead to a delay in care).   Epic staff messaging available for outptient concerns needing 1-2 business day response.      07/15/2023  7:43 AM

## 2023-07-15 NOTE — Progress Notes (Addendum)
 eLink Physician-Brief Progress Note Patient Name: Kainen Struckman DOB: Feb 21, 1946 MRN: [id number]   Date of Service  07/15/2023  HPI/Events of Note  78 year old male with pertinent PMH previous cholecystitis (had perc choley drain placed 03/02/2023), DMT2, HTN, HLD, PAF not on AC due previous bleeding, systolic CHF, CAD s/p CABG and AVR, CKD 3a, OSA presents to [facility] ED on 1/3 with abdominal pain/sepsis.  Developed septic versus cardiogenic shock and acute exacerbation of underlying heart failure complicated by acidosis and acute kidney injury  Started on norepinephrine  and supplemental oxygen .  Reviewed vital signs, labs, and imaging.     eICU Interventions  HIDA scan pending.  Cultures have been sent, antibiotics in place.  Norepinephrine  in place for MAP goal greater than 65.  DVT prophylaxis with heparin  GI prophylaxis not indicated   0625 -downtrending lactate, still 3.7.  Trend at noon.  Intervention Category Evaluation Type: New Patient Evaluation  Canesha Tesfaye 07/15/2023, 2:19 AM

## 2023-07-15 NOTE — Progress Notes (Signed)
 Pharmacy Antibiotic Note  Nathan [name] is a 78 y.o. male admitted on 07/14/2023 with sepsis.  Unknown origin.  Pharmacy has been consulted for Zosyn  dosing.  Plan: Zosyn  3.375g IV q8h (4 hour infusion). Zyvox  per MD Monitor renal function and cx data   Height: 5' 7 (170.2 cm) Weight: 58.1 kg (128 lb 1.4 oz) IBW/kg (Calculated) : 66.1  Temp (24hrs), Avg:98.4 F (36.9 C), Min:97.6 F (36.4 C), Max:99.4 F (37.4 C)  Recent Labs  Lab 07/14/23 1847 07/14/23 1914 07/14/23 2005  WBC 24.3*  --   --   CREATININE 1.96*  --   --   LATICACIDVEN  --  5.7* 5.6*    Estimated Creatinine Clearance: 25.5 mL/min (A) (by C-G formula based on SCr of 1.96 mg/dL (H)).    Allergies  Allergen Reactions   Shellfish-Derived Products Anaphylaxis   Nsaids Other (See Comments)    H/o gastric ulcers = NO NSAIDs per GI   Zolpidem Tartrate     Other Reaction(s): Other (See Comments)  Hallucinations    Antimicrobials this admission: 1/4 Zyvox  >>  1/4 Zosyn  >>  1/3 Cefepime  + Flagyl  + Vancomycin  x1 in ED  Dose adjustments this admission:  Microbiology results: 1/3 BCx:  1/3 Resp PCR: negative  1/4 MRSA PCR:   Thank you for allowing pharmacy to be a part of this patient's care.  Rosaline Millet PharmD 07/15/2023 2:24 AM

## 2023-07-15 NOTE — Progress Notes (Signed)
 [facility] ADULT ICU REPLACEMENT PROTOCOL   The patient does apply for the [facility] Adult ICU Electrolyte Replacment Protocol based on the criteria listed below:   1.Exclusion criteria: TCTS, ECMO, Dialysis, and Myasthenia Gravis patients 2. Is GFR >/= 30 ml/min? Yes.    Patient's GFR today is 38 3. Is SCr </= 2? Yes.   Patient's SCr is 1.82 mg/dL 4. Did SCr increase >/= 0.5 in 24 hours? No. 5.Pt's weight >40kg  Yes.   6. Abnormal electrolyte(s): Mg = 1.4  7. Electrolytes replaced per protocol 8.  Call MD STAT for K+ </= 2.5, Phos </= 1, or Mag </= 1 Physician:  Haze, eMD  Rosina LOISE Hamilton 07/15/2023 3:43 AM

## 2023-07-15 NOTE — Progress Notes (Signed)
 PHARMACY - PHYSICIAN COMMUNICATION CRITICAL VALUE ALERT - BLOOD CULTURE IDENTIFICATION (BCID)  Nathan [name] is an 78 y.o. male who presented to [facility] on 07/14/2023 with a chief complaint of abdominal pain  Assessment:   1/3 Bcx: 1/3 bottles E.coli (no resistance) Concern for cholecystitis Previous admission August 2024 w/ perc chole drain placed with E.coli on culture  Name of physician (or Provider) Contacted: Dr. Kassie  Current antibiotics: linezolid , Zosyn   Changes to prescribed antibiotics recommended: change to ceftriaxone  2 g IV q24h  Results for orders placed or performed during the hospital encounter of 07/14/23  Blood Culture ID Panel (Reflexed) (Collected: 07/14/2023  6:47 PM)  Result Value Ref Range   Enterococcus faecalis NOT DETECTED NOT DETECTED   Enterococcus Faecium NOT DETECTED NOT DETECTED   Listeria monocytogenes NOT DETECTED NOT DETECTED   Staphylococcus species NOT DETECTED NOT DETECTED   Staphylococcus aureus (BCID) NOT DETECTED NOT DETECTED   Staphylococcus epidermidis NOT DETECTED NOT DETECTED   Staphylococcus lugdunensis NOT DETECTED NOT DETECTED   Streptococcus species NOT DETECTED NOT DETECTED   Streptococcus agalactiae NOT DETECTED NOT DETECTED   Streptococcus pneumoniae NOT DETECTED NOT DETECTED   Streptococcus pyogenes NOT DETECTED NOT DETECTED   A.calcoaceticus-baumannii NOT DETECTED NOT DETECTED   Bacteroides fragilis NOT DETECTED NOT DETECTED   Enterobacterales DETECTED (A) NOT DETECTED   Enterobacter cloacae complex NOT DETECTED NOT DETECTED   Escherichia coli DETECTED (A) NOT DETECTED   Klebsiella aerogenes NOT DETECTED NOT DETECTED   Klebsiella oxytoca NOT DETECTED NOT DETECTED   Klebsiella pneumoniae NOT DETECTED NOT DETECTED   Proteus species NOT DETECTED NOT DETECTED   Salmonella species NOT DETECTED NOT DETECTED   Serratia marcescens NOT DETECTED NOT DETECTED   Haemophilus influenzae NOT DETECTED NOT DETECTED   Neisseria  meningitidis NOT DETECTED NOT DETECTED   Pseudomonas aeruginosa NOT DETECTED NOT DETECTED   Stenotrophomonas maltophilia NOT DETECTED NOT DETECTED   Candida albicans NOT DETECTED NOT DETECTED   Candida auris NOT DETECTED NOT DETECTED   Candida glabrata NOT DETECTED NOT DETECTED   Candida krusei NOT DETECTED NOT DETECTED   Candida parapsilosis NOT DETECTED NOT DETECTED   Candida tropicalis NOT DETECTED NOT DETECTED   Cryptococcus neoformans/gattii NOT DETECTED NOT DETECTED   CTX-M ESBL NOT DETECTED NOT DETECTED   Carbapenem resistance IMP NOT DETECTED NOT DETECTED   Carbapenem resistance KPC NOT DETECTED NOT DETECTED   Carbapenem resistance NDM NOT DETECTED NOT DETECTED   Carbapenem resist OXA 48 LIKE NOT DETECTED NOT DETECTED   Carbapenem resistance VIM NOT DETECTED NOT DETECTED     Stefano MARLA Bologna, PharmD, BCPS Clinical Pharmacist 07/15/2023 11:27 AM

## 2023-07-16 DIAGNOSIS — R6521 Severe sepsis with septic shock: Secondary | ICD-10-CM | POA: Diagnosis not present

## 2023-07-16 DIAGNOSIS — A4151 Sepsis due to Escherichia coli [E. coli]: Secondary | ICD-10-CM | POA: Diagnosis not present

## 2023-07-16 DIAGNOSIS — N170 Acute kidney failure with tubular necrosis: Secondary | ICD-10-CM | POA: Diagnosis not present

## 2023-07-16 DIAGNOSIS — I214 Non-ST elevation (NSTEMI) myocardial infarction: Secondary | ICD-10-CM | POA: Diagnosis not present

## 2023-07-16 DIAGNOSIS — K8010 Calculus of gallbladder with chronic cholecystitis without obstruction: Secondary | ICD-10-CM | POA: Diagnosis present

## 2023-07-16 DIAGNOSIS — I4891 Unspecified atrial fibrillation: Secondary | ICD-10-CM

## 2023-07-16 LAB — CBC
HCT: 25.6 % — ABNORMAL LOW (ref 39.0–52.0)
Hemoglobin: 8.6 g/dL — ABNORMAL LOW (ref 13.0–17.0)
MCH: 28.8 pg (ref 26.0–34.0)
MCHC: 33.6 g/dL (ref 30.0–36.0)
MCV: 85.6 fL (ref 80.0–100.0)
Platelets: 235 10*3/uL (ref 150–400)
RBC: 2.99 MIL/uL — ABNORMAL LOW (ref 4.22–5.81)
RDW: 17.3 % — ABNORMAL HIGH (ref 11.5–15.5)
WBC: 18.3 10*3/uL — ABNORMAL HIGH (ref 4.0–10.5)
nRBC: 0 % (ref 0.0–0.2)

## 2023-07-16 LAB — GLUCOSE, CAPILLARY
Glucose-Capillary: 101 mg/dL — ABNORMAL HIGH (ref 70–99)
Glucose-Capillary: 106 mg/dL — ABNORMAL HIGH (ref 70–99)
Glucose-Capillary: 115 mg/dL — ABNORMAL HIGH (ref 70–99)
Glucose-Capillary: 121 mg/dL — ABNORMAL HIGH (ref 70–99)
Glucose-Capillary: 158 mg/dL — ABNORMAL HIGH (ref 70–99)
Glucose-Capillary: 150 mg/dL — ABNORMAL HIGH (ref 70–99)
Glucose-Capillary: 146 mg/dL — ABNORMAL HIGH (ref 70–99)

## 2023-07-16 LAB — TROPONIN I (HIGH SENSITIVITY)
Troponin I (High Sensitivity): 2264 ng/L (ref <18)
Troponin I (High Sensitivity): 2196 ng/L (ref <18)

## 2023-07-16 LAB — BASIC METABOLIC PANEL
Anion gap: 17 — ABNORMAL HIGH (ref 5–15)
BUN: 64 mg/dL — ABNORMAL HIGH (ref 8–23)
CO2: 15 mmol/L — ABNORMAL LOW (ref 22–32)
Calcium: 8.7 mg/dL — ABNORMAL LOW (ref 8.9–10.3)
Chloride: 100 mmol/L (ref 98–111)
Creatinine, Ser: 2.98 mg/dL — ABNORMAL HIGH (ref 0.61–1.24)
GFR, Estimated: 21 mL/min — ABNORMAL LOW (ref >60)
Glucose, Bld: 112 mg/dL — ABNORMAL HIGH (ref 70–99)
Potassium: 4.4 mmol/L (ref 3.5–5.1)
Sodium: 132 mmol/L — ABNORMAL LOW (ref 135–145)

## 2023-07-16 LAB — COMPREHENSIVE METABOLIC PANEL
ALT: 109 U/L — ABNORMAL HIGH (ref 0–44)
AST: 97 U/L — ABNORMAL HIGH (ref 15–41)
Albumin: 2.5 g/dL — ABNORMAL LOW (ref 3.5–5.0)
Alkaline Phosphatase: 131 U/L — ABNORMAL HIGH (ref 38–126)
Anion gap: 13 (ref 5–15)
BUN: 61 mg/dL — ABNORMAL HIGH (ref 8–23)
CO2: 19 mmol/L — ABNORMAL LOW (ref 22–32)
Calcium: 8.8 mg/dL — ABNORMAL LOW (ref 8.9–10.3)
Chloride: 99 mmol/L (ref 98–111)
Creatinine, Ser: 2.56 mg/dL — ABNORMAL HIGH (ref 0.61–1.24)
GFR, Estimated: 25 mL/min — ABNORMAL LOW (ref >60)
Glucose, Bld: 106 mg/dL — ABNORMAL HIGH (ref 70–99)
Potassium: 4.5 mmol/L (ref 3.5–5.1)
Sodium: 131 mmol/L — ABNORMAL LOW (ref 135–145)
Total Bilirubin: 1.5 mg/dL — ABNORMAL HIGH (ref 0.0–1.2)
Total Protein: 5.5 g/dL — ABNORMAL LOW (ref 6.5–8.1)

## 2023-07-16 LAB — BLOOD GAS, ARTERIAL
Acid-base deficit: 4.4 mmol/L — ABNORMAL HIGH (ref 0.0–2.0)
Bicarbonate: 19.6 mmol/L — ABNORMAL LOW (ref 20.0–28.0)
Drawn by: 331471
O2 Saturation: 98.9 %
Patient temperature: 37
pCO2 arterial: 31 mm[Hg] — ABNORMAL LOW (ref 32–48)
pH, Arterial: 7.41 (ref 7.35–7.45)
pO2, Arterial: 90 mm[Hg] (ref 83–108)

## 2023-07-16 LAB — LACTIC ACID, PLASMA: Lactic Acid, Venous: 1.6 mmol/L (ref 0.5–1.9)

## 2023-07-16 LAB — HEPARIN LEVEL (UNFRACTIONATED): Heparin Unfractionated: 0.39 [IU]/mL (ref 0.30–0.70)

## 2023-07-16 LAB — MAGNESIUM: Magnesium: 2.4 mg/dL (ref 1.7–2.4)

## 2023-07-16 MED ORDER — HEPARIN BOLUS VIA INFUSION
3000.0000 [IU] | Freq: Once | INTRAVENOUS | Status: AC
  Administered 2023-07-16: 3000 [IU] via INTRAVENOUS
  Filled 2023-07-16: qty 3000

## 2023-07-16 MED ORDER — BISACODYL 5 MG PO TBEC
5.0000 mg | DELAYED_RELEASE_TABLET | ORAL | Status: AC
  Administered 2023-07-16: 5 mg via ORAL
  Filled 2023-07-16: qty 1

## 2023-07-16 MED ORDER — LACTATED RINGERS IV SOLN
INTRAVENOUS | Status: AC
Start: 2023-07-16 — End: 2023-07-16

## 2023-07-16 MED ORDER — DEXMEDETOMIDINE HCL IN NACL 200 MCG/50ML IV SOLN
0.0000 ug/kg/h | INTRAVENOUS | Status: DC
  Administered 2023-07-16: 0.1 ug/kg/h via INTRAVENOUS
  Filled 2023-07-16: qty 50

## 2023-07-16 MED ORDER — ASPIRIN 81 MG PO TBEC
81.0000 mg | DELAYED_RELEASE_TABLET | Freq: Every day | ORAL | Status: DC
  Administered 2023-07-16 – 2023-07-26 (×11): 81 mg via ORAL
  Filled 2023-07-16 (×11): qty 1

## 2023-07-16 MED ORDER — METOPROLOL TARTRATE 12.5 MG HALF TABLET
12.5000 mg | ORAL_TABLET | Freq: Two times a day (BID) | ORAL | Status: DC
  Administered 2023-07-16: 12.5 mg via ORAL
  Filled 2023-07-16: qty 1

## 2023-07-16 MED ORDER — METOPROLOL TARTRATE 25 MG PO TABS
25.0000 mg | ORAL_TABLET | Freq: Two times a day (BID) | ORAL | Status: AC
  Administered 2023-07-16 – 2023-07-17 (×3): 25 mg via ORAL
  Filled 2023-07-16 (×3): qty 1

## 2023-07-16 MED ORDER — HEPARIN (PORCINE) 25000 UT/250ML-% IV SOLN
900.0000 [IU]/h | INTRAVENOUS | Status: AC
  Administered 2023-07-16: 700 [IU]/h via INTRAVENOUS
  Administered 2023-07-17: 800 [IU]/h via INTRAVENOUS
  Filled 2023-07-16 (×2): qty 250

## 2023-07-16 NOTE — Progress Notes (Signed)
 eLink Physician-Brief Progress Note Patient Name: Nathan [name] DOB: 1945-11-10 MRN: [id number]   Date of Service  07/16/2023  HPI/Events of Note  Pt agitated, confused, had pulled out his Ivs.  Pt's wife states that he is prone to develop delirium whenever he is hospitalized.    eICU Interventions  IV access being reestablished.  Will start pt on precedex  infusion.  Monitor for development of bradycardia/hypotension.         Eliabeth Shoff M DELA CRUZ 07/16/2023, 11:24 PM

## 2023-07-16 NOTE — Progress Notes (Addendum)
 07/16/2023  Nathan [name] [id number] December 17, 1945  CARE TEAM: PCP: Yolande Toribio MATSU, MD  Outpatient Care Team: Patient Care Team: Yolande Toribio MATSU, MD as PCP - General (Internal Medicine) Jeffrie Oneil BROCKS, MD as PCP - Cardiology (Cardiology) Rubin Calamity, MD as Consulting Physician (General Surgery) Pyrtle, Gordy HERO, MD as Consulting Physician (Gastroenterology) Dohmeier, Dedra, MD as Consulting Physician (Neurology)  Inpatient Treatment Team: Treatment Team:  Kassie Acquanetta Bradley, MD Ccs, Md, MD Pccm, Md, MD Small, Harlene DEL, RN Erlenmeyer, Bernardino HERO, RN Ellington, Abby K, RPH Sonda Manuella POUR, RN Laurice Ernst, [place] [facility], [place], MD   Problem List:   Principal Problem:   Sepsis [facility]) Active Problems:   Type 2 diabetes mellitus with hyperlipidemia [facility])   Anxiety state   Balance problem   Abdominal pain   Tachycardia   Elevated LFTs   S/P AVR   Acute on chronic systolic CHF (congestive heart failure) (HCC)   Acute kidney injury on CKD stage 3a, GFR 45-59 ml/min (HCC)   CKD stage 3a, GFR 45-59 ml/min (HCC)   * No surgery found *      Assessment [facility] Stay = 1 days)      Sepsis -multiple potential etiologies   No objective evidence of cholecystitis by HIDA scan despite thickening on CAT scan.   Plan:  Patient's complaint of right sided chest pain atypical.  More subcostal/substernal.  Agree with cardiac and pulmonary workup to rule out other sources.  IV antibiotics per critical care/primary service.  The fact that shock has resolved, mental status improved, no Murphy sign, LFTs improving overall guardedly reassuring signs.  He gives me no Beverley sign nor a great story of biliary colic.  While CAT scan does show gallbladder wall thickening, HIDA scan raises concerns of some chronic calculus cholecystitis but nothing acute warranting emergent intervention.  High risk surgery from an anatomic and patient health status standpoint anyway.  If  patient shows evidence of more severe decline, the most I would do would be percutaneous cholecystostomy tube with outpatient cholangiogram in the future.  Try and hold off for now.  Patient very deconditioned & not a candidate for any abdominal surgery at this time given his deconditioned state, cardiac issues/CHF/MI August 2024, etc.    If patient has recurrent postprandial biliary colic, may need to reconsider cholecystectomy.  See if he could be delayed under more ideal conditions but would definitely need medical and cardiac clearance.  Defer to primary surgeon, Dr. Rubin.  -VTE prophylaxis- SCDs, etc  -mobilize as tolerated to help recovery  -Disposition: TBD       I reviewed nursing notes, ED provider notes, Consultant CCM notes, last 24 h vitals and pain scores, last 48 h intake and output, last 24 h labs and trends, and last 24 h imaging results.  I have reviewed this patient's available data, including medical history, events of note, test results, etc as part of my evaluation.   A significant portion of that time was spent in counseling. Care during the described time interval was provided by me.  This care required high  level of medical decision making.  07/16/2023    Subjective: (Chief complaint)  Weaned off pressors.  Patient much more alert.  Nurse in room.    Patient complains of pain in his right anterior rib cage.  Denies any history of trigger with eating but appetite has been down.  He has not eaten in the past day and he still has some persistent pain and  soreness.  In atrial fibrillation.  Objective:  Vital signs:  Vitals:   07/16/23 0715 07/16/23 0730 07/16/23 0745 07/16/23 0800  BP: 135/65 126/73 130/70 133/80  Pulse: (!) 121 (!) 106 (!) 115 (!) 111  Resp: (!) 23 (!) 28 (!) 25 (!) 23  Temp:    (!) 97.5 F (36.4 C)  TempSrc:    Axillary  SpO2: 91% 97% 93% 94%  Weight:      Height:           Intake/Output   Yesterday:  01/04 0701 -  01/05 0700 In: 1871.7 [I.V.:376.7; IV Piggyback:1495] Out: 150 [Urine:150] This shift:  No intake/output data recorded.  Bowel function:  Flatus: No  BM:  No  Drain: (No drain)   Physical Exam:  General: Awake and more alert.  Oriented x 3.  Tired but not toxic.  Neuro: No obvious focal deficits or sensory abnormalities.  Mental status markedly improved. Eyes: PERRL,  Sclera clear.  No icterus Lymph: No head/neck/groin lymphadenopathy HENT: Normocephalic, Mucus membranes moist.  No thrush Neck: Supple, No tracheal deviation.  No obvious thyromegaly Chest: No pain to chest wall compression except at right anterior rib cage just lateral to the sternum.  No crepitus or step-off.SABRA Richard respiratory excursion.  No audible wheezing CV:  Pulses intact.  Regular rhythm.  No major extremity edema MS: Normal AROM mjr joints.  No obvious deformity  Abdomen: Soft.  Mildy distended.  Nontender.  In the abdomen with no Murphy sign.  No evidence of peritonitis.  No incarcerated hernias.  Ext:   No deformity.  No mjr edema.  No cyanosis Skin: No petechiae / purpurea.  No major sores.  Warm and dry    Results:   Cultures: Recent Results (from the past 720 hours)  Blood culture (routine x 2)     Status: None (Preliminary result)   Collection Time: 07/14/23  6:47 PM   Specimen: BLOOD  Result Value Ref Range Status   Specimen Description   Final    BLOOD LEFT ANTECUBITAL Performed at [facility], [address], [place], [place] [address]    Special Requests   Final    BOTTLES DRAWN AEROBIC AND ANAEROBIC Blood Culture adequate volume Performed at [facility], [address], [place], [place] [address]    Culture  Setup Time   Final    GRAM NEGATIVE RODS ANAEROBIC BOTTLE ONLY CRITICAL RESULT CALLED TO, READ BACK BY AND VERIFIED WITH: [facility] SWAYNE [id number] AT 1105 BY EC Performed at [facility], [address], [place], [place]  [address]    Culture GRAM NEGATIVE RODS  Final   Report Status PENDING  Incomplete  Blood Culture ID Panel (Reflexed)     Status: Abnormal   Collection Time: 07/14/23  6:47 PM  Result Value Ref Range Status   Enterococcus faecalis NOT DETECTED NOT DETECTED Final   Enterococcus Faecium NOT DETECTED NOT DETECTED Final   Listeria monocytogenes NOT DETECTED NOT DETECTED Final   Staphylococcus species NOT DETECTED NOT DETECTED Final   Staphylococcus aureus (BCID) NOT DETECTED NOT DETECTED Final   Staphylococcus epidermidis NOT DETECTED NOT DETECTED Final   Staphylococcus lugdunensis NOT DETECTED NOT DETECTED Final   Streptococcus species NOT DETECTED NOT DETECTED Final   Streptococcus agalactiae NOT DETECTED NOT DETECTED Final   Streptococcus pneumoniae NOT DETECTED NOT DETECTED Final   Streptococcus pyogenes NOT DETECTED NOT DETECTED Final   A.calcoaceticus-baumannii NOT DETECTED NOT DETECTED Final  Bacteroides fragilis NOT DETECTED NOT DETECTED Final   Enterobacterales DETECTED (A) NOT DETECTED Final    Comment: Enterobacterales represent a large order of gram negative bacteria, not a single organism. CRITICAL RESULT CALLED TO, READ BACK BY AND VERIFIED WITH: PHARMD MARY SWAYNE [phone] AT 1105 BY EC    Enterobacter cloacae complex NOT DETECTED NOT DETECTED Final   Escherichia coli DETECTED (A) NOT DETECTED Final    Comment: CRITICAL RESULT CALLED TO, READ BACK BY AND VERIFIED WITH: PHARMD MARY SWAYNE [phone] AT 1105 BY EC    Klebsiella aerogenes NOT DETECTED NOT DETECTED Final   Klebsiella oxytoca NOT DETECTED NOT DETECTED Final   Klebsiella pneumoniae NOT DETECTED NOT DETECTED Final   Proteus species NOT DETECTED NOT DETECTED Final   Salmonella species NOT DETECTED NOT DETECTED Final   Serratia marcescens NOT DETECTED NOT DETECTED Final   Haemophilus influenzae NOT DETECTED NOT DETECTED Final   Neisseria meningitidis NOT DETECTED NOT DETECTED Final   Pseudomonas aeruginosa NOT  DETECTED NOT DETECTED Final   Stenotrophomonas maltophilia NOT DETECTED NOT DETECTED Final   Candida albicans NOT DETECTED NOT DETECTED Final   Candida auris NOT DETECTED NOT DETECTED Final   Candida glabrata NOT DETECTED NOT DETECTED Final   Candida krusei NOT DETECTED NOT DETECTED Final   Candida parapsilosis NOT DETECTED NOT DETECTED Final   Candida tropicalis NOT DETECTED NOT DETECTED Final   Cryptococcus neoformans/gattii NOT DETECTED NOT DETECTED Final   CTX-M ESBL NOT DETECTED NOT DETECTED Final   Carbapenem resistance IMP NOT DETECTED NOT DETECTED Final   Carbapenem resistance KPC NOT DETECTED NOT DETECTED Final   Carbapenem resistance NDM NOT DETECTED NOT DETECTED Final   Carbapenem resist OXA 48 LIKE NOT DETECTED NOT DETECTED Final   Carbapenem resistance VIM NOT DETECTED NOT DETECTED Final    Comment: Performed at [facility], [address], [place], [place] [address]  Resp panel by RT-PCR (RSV, Flu A&B, Covid) Anterior Nasal Swab     Status: None   Collection Time: 07/14/23  7:01 PM   Specimen: Anterior Nasal Swab  Result Value Ref Range Status   SARS Coronavirus 2 by RT PCR NEGATIVE NEGATIVE Final    Comment: (NOTE) SARS-CoV-2 target nucleic acids are NOT DETECTED.  The SARS-CoV-2 RNA is generally detectable in upper respiratory specimens during the acute phase of infection. The lowest concentration of SARS-CoV-2 viral copies this assay can detect is 138 copies/mL. A negative result does not preclude SARS-Cov-2 infection and should not be used as the sole basis for treatment or other patient management decisions. A negative result may occur with  improper specimen collection/handling, submission of specimen other than nasopharyngeal swab, presence of viral mutation(s) within the areas targeted by this assay, and inadequate number of viral copies(<138 copies/mL). A negative result must be combined with clinical observations, patient history, and  epidemiological information. The expected result is Negative.  Fact Sheet for Patients:  bloggercourse.com  Fact Sheet for Healthcare Providers:  seriousbroker.it  This test is no t yet approved or cleared by the [place]  FDA and  has been authorized for detection and/or diagnosis of SARS-CoV-2 by FDA under an Emergency Use Authorization (EUA). This EUA will remain  in effect (meaning this test can be used) for the duration of the COVID-19 declaration under Section 564(b)(1) of the Act, 21 U.S.C.section 360bbb-3(b)(1), unless the authorization is terminated  or revoked sooner.       Influenza A by PCR NEGATIVE NEGATIVE Final   Influenza B  by PCR NEGATIVE NEGATIVE Final    Comment: (NOTE) The Xpert Xpress SARS-CoV-2/FLU/RSV plus assay is intended as an aid in the diagnosis of influenza from Nasopharyngeal swab specimens and should not be used as a sole basis for treatment. Nasal washings and aspirates are unacceptable for Xpert Xpress SARS-CoV-2/FLU/RSV testing.  Fact Sheet for Patients: bloggercourse.com  Fact Sheet for Healthcare Providers: seriousbroker.it  This test is not yet approved or cleared by the [place]  FDA and has been authorized for detection and/or diagnosis of SARS-CoV-2 by FDA under an Emergency Use Authorization (EUA). This EUA will remain in effect (meaning this test can be used) for the duration of the COVID-19 declaration under Section 564(b)(1) of the Act, 21 U.S.C. section 360bbb-3(b)(1), unless the authorization is terminated or revoked.     Resp Syncytial Virus by PCR NEGATIVE NEGATIVE Final    Comment: (NOTE) Fact Sheet for Patients: bloggercourse.com  Fact Sheet for Healthcare Providers: seriousbroker.it  This test is not yet approved or cleared by the [place]  FDA and has been  authorized for detection and/or diagnosis of SARS-CoV-2 by FDA under an Emergency Use Authorization (EUA). This EUA will remain in effect (meaning this test can be used) for the duration of the COVID-19 declaration under Section 564(b)(1) of the Act, 21 U.S.C. section 360bbb-3(b)(1), unless the authorization is terminated or revoked.  Performed at [facility], [address] [place], [place] [address]   MRSA Next Gen by PCR, Nasal     Status: None   Collection Time: 07/15/23  2:09 AM   Specimen: Nasal Mucosa; Nasal Swab  Result Value Ref Range Status   MRSA by PCR Next Gen NOT DETECTED NOT DETECTED Final    Comment: (NOTE) The GeneXpert MRSA Assay (FDA approved for NASAL specimens only), is one component of a comprehensive MRSA colonization surveillance program. It is not intended to diagnose MRSA infection nor to guide or monitor treatment for MRSA infections. Test performance is not FDA approved in patients less than 94 years old. Performed at [facility], [address] [place], [place] [address]   Blood culture (routine x 2)     Status: None (Preliminary result)   Collection Time: 07/15/23  2:57 AM   Specimen: BLOOD  Result Value Ref Range Status   Specimen Description   Final    BLOOD BLOOD RIGHT HAND AEROBIC BOTTLE ONLY Performed at [facility], [address], [place], [place] [address]    Special Requests   Final    BOTTLES DRAWN AEROBIC ONLY Blood Culture results may not be optimal due to an inadequate volume of blood received in culture bottles Performed at [facility], [address] [place], [place] [address]    Culture   Final    NO GROWTH 1 DAY Performed at [facility], [address] [place], [place] [address]    Report Status PENDING  Incomplete    Labs: Results for orders placed or performed during the hospital encounter of 07/14/23 (from the past 48 hours)  CBC  with Differential     Status: Abnormal   Collection Time: 07/14/23  6:47 PM  Result Value Ref Range   WBC 24.3 (H) 4.0 - 10.5 K/uL   RBC 3.38 (L) 4.22 - 5.81 MIL/uL   Hemoglobin 10.1 (L) 13.0 - 17.0 g/dL   HCT 70.7 (L) 60.9 - 47.9 %   MCV 86.4 80.0 - 100.0 fL   MCH 29.9 26.0 - 34.0  pg   MCHC 34.6 30.0 - 36.0 g/dL   RDW 82.7 (H) 88.4 - 84.4 %   Platelets 263 150 - 400 K/uL   nRBC 0.0 0.0 - 0.2 %   Neutrophils Relative % 89 %   Neutro Abs 21.7 (H) 1.7 - 7.7 K/uL   Lymphocytes Relative 3 %   Lymphs Abs 0.8 0.7 - 4.0 K/uL   Monocytes Relative 7 %   Monocytes Absolute 1.6 (H) 0.1 - 1.0 K/uL   Eosinophils Relative 0 %   Eosinophils Absolute 0.0 0.0 - 0.5 K/uL   Basophils Relative 0 %   Basophils Absolute 0.0 0.0 - 0.1 K/uL   Immature Granulocytes 1 %   Abs Immature Granulocytes 0.20 (H) 0.00 - 0.07 K/uL    Comment: Performed at [facility], [address] [place], [place] [address]  Comprehensive metabolic panel     Status: Abnormal   Collection Time: 07/14/23  6:47 PM  Result Value Ref Range   Sodium 134 (L) 135 - 145 mmol/L   Potassium 4.5 3.5 - 5.1 mmol/L   Chloride 100 98 - 111 mmol/L   CO2 16 (L) 22 - 32 mmol/L   Glucose, Bld 169 (H) 70 - 99 mg/dL    Comment: Glucose reference range applies only to samples taken after fasting for at least 8 hours.   BUN 48 (H) 8 - 23 mg/dL   Creatinine, Ser 8.03 (H) 0.61 - 1.24 mg/dL   Calcium  9.6 8.9 - 10.3 mg/dL   Total Protein 6.7 6.5 - 8.1 g/dL   Albumin 3.2 (L) 3.5 - 5.0 g/dL   AST 778 (H) 15 - 41 U/L   ALT 192 (H) 0 - 44 U/L   Alkaline Phosphatase 178 (H) 38 - 126 U/L   Total Bilirubin 2.5 (H) 0.0 - 1.2 mg/dL   GFR, Estimated 34 (L) >60 mL/min    Comment: (NOTE) Calculated using the CKD-EPI Creatinine Equation (2021)    Anion gap 18 (H) 5 - 15    Comment: Performed at [facility], [address], [place], [place] [address]  Troponin I (High Sensitivity)     Status: Abnormal   Collection  Time: 07/14/23  6:47 PM  Result Value Ref Range   Troponin I (High Sensitivity) 18 (H) <18 ng/L    Comment: (NOTE) Elevated high sensitivity troponin I (hsTnI) values and significant  changes across serial measurements may suggest ACS but many other  chronic and acute conditions are known to elevate hsTnI results.  Refer to the Links section for chest pain algorithms and additional  guidance. Performed at [facility], [address], [place], [place] [address]   Lipase, blood     Status: None   Collection Time: 07/14/23  6:47 PM  Result Value Ref Range   Lipase 43 11 - 51 U/L    Comment: Performed at [facility], [address] [place], [place] [address]  Blood culture (routine x 2)     Status: None (Preliminary result)   Collection Time: 07/14/23  6:47 PM   Specimen: BLOOD  Result Value Ref Range   Specimen Description      BLOOD LEFT ANTECUBITAL Performed at [facility], [address], [place], [place] [address]    Special Requests      BOTTLES DRAWN AEROBIC AND ANAEROBIC Blood Culture adequate volume Performed at [facility], [address] [place], [place] [address]    Culture  Setup Time      GRAM NEGATIVE RODS ANAEROBIC BOTTLE ONLY CRITICAL RESULT CALLED TO, READ BACK BY AND VERIFIED WITH: MAYA SHUCK SWAYNE [id number] AT 1105 BY EC Performed at [facility], [address] [place], [place] [address]    Culture GRAM NEGATIVE RODS    Report Status PENDING   Brain natriuretic peptide     Status: Abnormal   Collection Time: 07/14/23  6:47 PM  Result Value Ref Range   B Natriuretic Peptide 2,349.7 (H) 0.0 - 100.0 pg/mL    Comment: Performed at [facility], [address] [place] [address], [place], [place] [address]  Blood Culture ID Panel (Reflexed)     Status: Abnormal   Collection Time: 07/14/23  6:47 PM  Result Value Ref Range   Enterococcus faecalis NOT DETECTED NOT DETECTED    Enterococcus Faecium NOT DETECTED NOT DETECTED   Listeria monocytogenes NOT DETECTED NOT DETECTED   Staphylococcus species NOT DETECTED NOT DETECTED   Staphylococcus aureus (BCID) NOT DETECTED NOT DETECTED   Staphylococcus epidermidis NOT DETECTED NOT DETECTED   Staphylococcus lugdunensis NOT DETECTED NOT DETECTED   Streptococcus species NOT DETECTED NOT DETECTED   Streptococcus agalactiae NOT DETECTED NOT DETECTED   Streptococcus pneumoniae NOT DETECTED NOT DETECTED   Streptococcus pyogenes NOT DETECTED NOT DETECTED   A.calcoaceticus-baumannii NOT DETECTED NOT DETECTED   Bacteroides fragilis NOT DETECTED NOT DETECTED   Enterobacterales DETECTED (A) NOT DETECTED    Comment: Enterobacterales represent a large order of gram negative bacteria, not a single organism. CRITICAL RESULT CALLED TO, READ BACK BY AND VERIFIED WITH: PHARMD MARY SWAYNE [phone] AT 1105 BY EC    Enterobacter cloacae complex NOT DETECTED NOT DETECTED   Escherichia coli DETECTED (A) NOT DETECTED    Comment: CRITICAL RESULT CALLED TO, READ BACK BY AND VERIFIED WITH: PHARMD MARY SWAYNE [phone] AT 1105 BY EC    Klebsiella aerogenes NOT DETECTED NOT DETECTED   Klebsiella oxytoca NOT DETECTED NOT DETECTED   Klebsiella pneumoniae NOT DETECTED NOT DETECTED   Proteus species NOT DETECTED NOT DETECTED   Salmonella species NOT DETECTED NOT DETECTED   Serratia marcescens NOT DETECTED NOT DETECTED   Haemophilus influenzae NOT DETECTED NOT DETECTED   Neisseria meningitidis NOT DETECTED NOT DETECTED   Pseudomonas aeruginosa NOT DETECTED NOT DETECTED   Stenotrophomonas maltophilia NOT DETECTED NOT DETECTED   Candida albicans NOT DETECTED NOT DETECTED   Candida auris NOT DETECTED NOT DETECTED   Candida glabrata NOT DETECTED NOT DETECTED   Candida krusei NOT DETECTED NOT DETECTED   Candida parapsilosis NOT DETECTED NOT DETECTED   Candida tropicalis NOT DETECTED NOT DETECTED   Cryptococcus neoformans/gattii NOT DETECTED NOT  DETECTED   CTX-M ESBL NOT DETECTED NOT DETECTED   Carbapenem resistance IMP NOT DETECTED NOT DETECTED   Carbapenem resistance KPC NOT DETECTED NOT DETECTED   Carbapenem resistance NDM NOT DETECTED NOT DETECTED   Carbapenem resist OXA 48 LIKE NOT DETECTED NOT DETECTED   Carbapenem resistance VIM NOT DETECTED NOT DETECTED    Comment: Performed at [facility], [address], [place], [place] [address]  Resp panel by RT-PCR (RSV, Flu A&B, Covid) Anterior Nasal Swab     Status: None   Collection Time: 07/14/23  7:01 PM   Specimen: Anterior Nasal Swab  Result Value Ref Range   SARS Coronavirus 2 by RT PCR NEGATIVE NEGATIVE    Comment: (NOTE) SARS-CoV-2 target nucleic acids are NOT DETECTED.  The SARS-CoV-2 RNA is generally detectable in upper respiratory  specimens during the acute phase of infection. The lowest concentration of SARS-CoV-2 viral copies this assay can detect is 138 copies/mL. A negative result does not preclude SARS-Cov-2 infection and should not be used as the sole basis for treatment or other patient management decisions. A negative result may occur with  improper specimen collection/handling, submission of specimen other than nasopharyngeal swab, presence of viral mutation(s) within the areas targeted by this assay, and inadequate number of viral copies(<138 copies/mL). A negative result must be combined with clinical observations, patient history, and epidemiological information. The expected result is Negative.  Fact Sheet for Patients:  bloggercourse.com  Fact Sheet for Healthcare Providers:  seriousbroker.it  This test is no t yet approved or cleared by the [place]  FDA and  has been authorized for detection and/or diagnosis of SARS-CoV-2 by FDA under an Emergency Use Authorization (EUA). This EUA will remain  in effect (meaning this test can be used) for the duration of the COVID-19 declaration under  Section 564(b)(1) of the Act, 21 U.S.C.section 360bbb-3(b)(1), unless the authorization is terminated  or revoked sooner.       Influenza A by PCR NEGATIVE NEGATIVE   Influenza B by PCR NEGATIVE NEGATIVE    Comment: (NOTE) The Xpert Xpress SARS-CoV-2/FLU/RSV plus assay is intended as an aid in the diagnosis of influenza from Nasopharyngeal swab specimens and should not be used as a sole basis for treatment. Nasal washings and aspirates are unacceptable for Xpert Xpress SARS-CoV-2/FLU/RSV testing.  Fact Sheet for Patients: bloggercourse.com  Fact Sheet for Healthcare Providers: seriousbroker.it  This test is not yet approved or cleared by the [place]  FDA and has been authorized for detection and/or diagnosis of SARS-CoV-2 by FDA under an Emergency Use Authorization (EUA). This EUA will remain in effect (meaning this test can be used) for the duration of the COVID-19 declaration under Section 564(b)(1) of the Act, 21 U.S.C. section 360bbb-3(b)(1), unless the authorization is terminated or revoked.     Resp Syncytial Virus by PCR NEGATIVE NEGATIVE    Comment: (NOTE) Fact Sheet for Patients: bloggercourse.com  Fact Sheet for Healthcare Providers: seriousbroker.it  This test is not yet approved or cleared by the [place]  FDA and has been authorized for detection and/or diagnosis of SARS-CoV-2 by FDA under an Emergency Use Authorization (EUA). This EUA will remain in effect (meaning this test can be used) for the duration of the COVID-19 declaration under Section 564(b)(1) of the Act, 21 U.S.C. section 360bbb-3(b)(1), unless the authorization is terminated or revoked.  Performed at [facility], [address] [place], [place] [address]   I-Stat CG4 Lactic Acid     Status: Abnormal   Collection Time: 07/14/23  7:14 PM  Result Value Ref Range    Lactic Acid, Venous 5.7 (HH) 0.5 - 1.9 mmol/L   Comment NOTIFIED PHYSICIAN   Troponin I (High Sensitivity)     Status: None   Collection Time: 07/14/23  7:42 PM  Result Value Ref Range   Troponin I (High Sensitivity) 16 <18 ng/L    Comment: (NOTE) Elevated high sensitivity troponin I (hsTnI) values and significant  changes across serial measurements may suggest ACS but many other  chronic and acute conditions are known to elevate hsTnI results.  Refer to the Links section for chest pain algorithms and additional  guidance. Performed at [facility], [address], [place], [place] [address]   I-Stat CG4 Lactic Acid     Status: Abnormal   Collection Time:  07/14/23  8:05 PM  Result Value Ref Range   Lactic Acid, Venous 5.6 (HH) 0.5 - 1.9 mmol/L   Comment NOTIFIED PHYSICIAN   Urinalysis, w/ Reflex to Culture (Infection Suspected) -Urine, Clean Catch     Status: Abnormal   Collection Time: 07/14/23  9:10 PM  Result Value Ref Range   Specimen Source URINE, CLEAN CATCH    Color, Urine AMBER (A) YELLOW    Comment: BIOCHEMICALS MAY BE AFFECTED BY COLOR   APPearance HAZY (A) CLEAR   Specific Gravity, Urine 1.025 1.005 - 1.030   pH 5.0 5.0 - 8.0   Glucose, UA NEGATIVE NEGATIVE mg/dL   Hgb urine dipstick NEGATIVE NEGATIVE   Bilirubin Urine NEGATIVE NEGATIVE   Ketones, ur NEGATIVE NEGATIVE mg/dL   Protein, ur NEGATIVE NEGATIVE mg/dL   Nitrite NEGATIVE NEGATIVE   Leukocytes,Ua NEGATIVE NEGATIVE   RBC / HPF 0-5 0 - 5 RBC/hpf   WBC, UA 0-5 0 - 5 WBC/hpf    Comment:        Reflex urine culture not performed if WBC <=10, OR if Squamous epithelial cells >5. If Squamous epithelial cells >5 suggest recollection.    Bacteria, UA NONE SEEN NONE SEEN   Squamous Epithelial / HPF 0-5 0 - 5 /HPF   Mucus PRESENT    Hyaline Casts, UA PRESENT     Comment: Performed at [facility], [address] [place], [place] [address]  Protime-INR     Status:  Abnormal   Collection Time: 07/14/23 11:51 PM  Result Value Ref Range   Prothrombin Time 18.5 (H) 11.4 - 15.2 seconds   INR 1.5 (H) 0.8 - 1.2    Comment: (NOTE) INR goal varies based on device and disease states. Performed at [facility], [address], [place], [place] [address]   Cortisol     Status: None   Collection Time: 07/15/23 12:27 AM  Result Value Ref Range   Cortisol, Plasma 47.5 ug/dL    Comment: (NOTE) AM    6.7 - 22.6 ug/dL PM   <89.9       ug/dL Performed at [facility], [address], [place], [place] [address]   MRSA Next Gen by PCR, Nasal     Status: None   Collection Time: 07/15/23  2:09 AM   Specimen: Nasal Mucosa; Nasal Swab  Result Value Ref Range   MRSA by PCR Next Gen NOT DETECTED NOT DETECTED    Comment: (NOTE) The GeneXpert MRSA Assay (FDA approved for NASAL specimens only), is one component of a comprehensive MRSA colonization surveillance program. It is not intended to diagnose MRSA infection nor to guide or monitor treatment for MRSA infections. Test performance is not FDA approved in patients less than 50 years old. Performed at [facility], [address], [place], [place] [address]   Type and screen If need to transfuse blood products please use the blood administration order set     Status: None   Collection Time: 07/15/23  2:56 AM  Result Value Ref Range   ABO/RH(D) A POS    Antibody Screen NEG    Sample Expiration      07/18/2023,2359 Performed at [facility] [address], [place], [place] [address]   Blood culture (routine x 2)     Status: None (Preliminary result)   Collection Time: 07/15/23  2:57 AM   Specimen: BLOOD  Result Value Ref Range   Specimen Description  BLOOD BLOOD RIGHT HAND AEROBIC BOTTLE ONLY Performed at [facility], [address] [place], [place] [address]    Special Requests      BOTTLES DRAWN AEROBIC ONLY Blood  Culture results may not be optimal due to an inadequate volume of blood received in culture bottles Performed at [facility], [address] [place], [place] [address]    Culture      NO GROWTH 1 DAY Performed at [facility], [address] [place], [place] [address]    Report Status PENDING   CBC     Status: Abnormal   Collection Time: 07/15/23  2:57 AM  Result Value Ref Range   WBC 27.0 (H) 4.0 - 10.5 K/uL   RBC 3.26 (L) 4.22 - 5.81 MIL/uL   Hemoglobin 9.3 (L) 13.0 - 17.0 g/dL   HCT 70.7 (L) 60.9 - 47.9 %   MCV 89.6 80.0 - 100.0 fL   MCH 28.5 26.0 - 34.0 pg   MCHC 31.8 30.0 - 36.0 g/dL   RDW 82.1 (H) 88.4 - 84.4 %   Platelets 284 150 - 400 K/uL   nRBC 0.0 0.0 - 0.2 %    Comment: Performed at [facility], [address], [place], [place] [address]  Comprehensive metabolic panel     Status: Abnormal   Collection Time: 07/15/23  2:57 AM  Result Value Ref Range   Sodium 130 (L) 135 - 145 mmol/L   Potassium 4.6 3.5 - 5.1 mmol/L   Chloride 99 98 - 111 mmol/L   CO2 15 (L) 22 - 32 mmol/L   Glucose, Bld 157 (H) 70 - 99 mg/dL    Comment: Glucose reference range applies only to samples taken after fasting for at least 8 hours.   BUN 50 (H) 8 - 23 mg/dL   Creatinine, Ser 8.17 (H) 0.61 - 1.24 mg/dL   Calcium  8.9 8.9 - 10.3 mg/dL   Total Protein 6.1 (L) 6.5 - 8.1 g/dL   Albumin 2.9 (L) 3.5 - 5.0 g/dL   AST 848 (H) 15 - 41 U/L   ALT 153 (H) 0 - 44 U/L   Alkaline Phosphatase 149 (H) 38 - 126 U/L   Total Bilirubin 3.5 (H) 0.0 - 1.2 mg/dL   GFR, Estimated 38 (L) >60 mL/min    Comment: (NOTE) Calculated using the CKD-EPI Creatinine Equation (2021)    Anion gap 16 (H) 5 - 15    Comment: Performed at [facility], [address], [place], [place] [address]  Lipase, blood     Status: Abnormal   Collection Time: 07/15/23  2:57 AM  Result Value Ref Range   Lipase 133 (H) 11 - 51 U/L    Comment: Performed at [facility], [address] [place], [place] [address]  Magnesium      Status: Abnormal   Collection Time: 07/15/23  2:57 AM  Result Value Ref Range   Magnesium  1.4 (L) 1.7 - 2.4 mg/dL    Comment: Performed at [facility], [address], [place], [place] [address]  Phosphorus     Status: None   Collection Time: 07/15/23  2:57 AM  Result Value Ref Range   Phosphorus 3.7 2.5 - 4.6 mg/dL    Comment: Performed at [facility], [address] [place], [place] [address]  Procalcitonin     Status: None   Collection Time: 07/15/23  2:57 AM  Result Value  Ref Range   Procalcitonin 3.86 ng/mL    Comment:        Interpretation: PCT > 2 ng/mL: Systemic infection (sepsis) is likely, unless other causes are known. (NOTE)       Sepsis PCT Algorithm           Lower Respiratory Tract                                      Infection PCT Algorithm    ----------------------------     ----------------------------         PCT < 0.25 ng/mL                PCT < 0.10 ng/mL          Strongly encourage             Strongly discourage   discontinuation of antibiotics    initiation of antibiotics    ----------------------------     -----------------------------       PCT 0.25 - 0.50 ng/mL            PCT 0.10 - 0.25 ng/mL               OR       >80% decrease in PCT            Discourage initiation of                                            antibiotics      Encourage discontinuation           of antibiotics    ----------------------------     -----------------------------         PCT >= 0.50 ng/mL              PCT 0.26 - 0.50 ng/mL               AND       <80% decrease in PCT              Encourage initiation of                                             antibiotics       Encourage continuation           of antibiotics    ----------------------------     -----------------------------        PCT >= 0.50 ng/mL                  PCT > 0.50 ng/mL                AND         increase in PCT                  Strongly encourage                                      initiation of antibiotics    Strongly encourage escalation           of antibiotics                                     -----------------------------  PCT <= 0.25 ng/mL                                                 OR                                        > 80% decrease in PCT                                      Discontinue / Do not initiate                                             antibiotics  Performed at [facility], [name] [place], [place] [address]   Protime-INR     Status: Abnormal   Collection Time: 07/15/23  2:57 AM  Result Value Ref Range   Prothrombin Time 18.5 (H) 11.4 - 15.2 seconds   INR 1.5 (H) 0.8 - 1.2    Comment: (NOTE) INR goal varies based on device and disease states. Performed at [facility], [address], [place], [place] [address]   Potassium     Status: None   Collection Time: 07/15/23  2:59 AM  Result Value Ref Range   Potassium 4.8 3.5 - 5.1 mmol/L    Comment: Performed at [facility], [address], [place], [place] [address]  Magnesium      Status: Abnormal   Collection Time: 07/15/23  2:59 AM  Result Value Ref Range   Magnesium  1.4 (L) 1.7 - 2.4 mg/dL    Comment: Performed at [facility], [address] [place], [place] [address]  Phosphorus     Status: None   Collection Time: 07/15/23  2:59 AM  Result Value Ref Range   Phosphorus 3.6 2.5 - 4.6 mg/dL    Comment: Performed at [facility], [address] [place], [place] [address]  Glucose, capillary     Status: Abnormal   Collection Time: 07/15/23  3:50 AM  Result Value Ref Range   Glucose-Capillary 166 (H) 70 - 99 mg/dL    Comment: Glucose reference range applies only to samples taken after fasting for at least 8 hours.  Lactic acid,  plasma     Status: Abnormal   Collection Time: 07/15/23  4:52 AM  Result Value Ref Range   Lactic Acid, Venous 3.7 (HH) 0.5 - 1.9 mmol/L    Comment: CRITICAL RESULT CALLED TO, READ BACK BY AND VERIFIED WITH TAYLOR T. @ 0551 07/15/23 MCLEAN K. Performed at [facility], [address], [place], [place] [address]   Glucose, capillary     Status: Abnormal   Collection Time: 07/15/23  7:48 AM  Result Value Ref Range   Glucose-Capillary 140 (H) 70 - 99 mg/dL    Comment: Glucose reference range applies only to samples taken after fasting for at least 8 hours.   Comment 1 Notify RN    Comment 2 Document in Chart   Lactic acid, plasma     Status: Abnormal  Collection Time: 07/15/23 11:48 AM  Result Value Ref Range   Lactic Acid, Venous 4.4 (HH) 0.5 - 1.9 mmol/L    Comment: CRITICAL RESULT CALLED TO, READ BACK BY AND VERIFIED WITH [place], J RN @ 1239 ON 07/15/23 CAL Performed at [facility], [address] [place], [place] [address]   Basic metabolic panel     Status: Abnormal   Collection Time: 07/15/23 11:48 AM  Result Value Ref Range   Sodium 131 (L) 135 - 145 mmol/L   Potassium 4.4 3.5 - 5.1 mmol/L   Chloride 99 98 - 111 mmol/L   CO2 16 (L) 22 - 32 mmol/L   Glucose, Bld 166 (H) 70 - 99 mg/dL    Comment: Glucose reference range applies only to samples taken after fasting for at least 8 hours.   BUN 54 (H) 8 - 23 mg/dL   Creatinine, Ser 7.74 (H) 0.61 - 1.24 mg/dL   Calcium  9.0 8.9 - 10.3 mg/dL   GFR, Estimated 29 (L) >60 mL/min    Comment: (NOTE) Calculated using the CKD-EPI Creatinine Equation (2021)    Anion gap 16 (H) 5 - 15    Comment: Performed at [facility], [address], [place], [place] [address]  Glucose, capillary     Status: Abnormal   Collection Time: 07/15/23 11:50 AM  Result Value Ref Range   Glucose-Capillary 148 (H) 70 - 99 mg/dL    Comment: Glucose reference range applies only to samples taken  after fasting for at least 8 hours.   Comment 1 Notify RN    Comment 2 Document in Chart   Glucose, capillary     Status: Abnormal   Collection Time: 07/15/23  3:44 PM  Result Value Ref Range   Glucose-Capillary 127 (H) 70 - 99 mg/dL    Comment: Glucose reference range applies only to samples taken after fasting for at least 8 hours.   Comment 1 Notify RN    Comment 2 Document in Chart   Glucose, capillary     Status: Abnormal   Collection Time: 07/15/23  7:31 PM  Result Value Ref Range   Glucose-Capillary 145 (H) 70 - 99 mg/dL    Comment: Glucose reference range applies only to samples taken after fasting for at least 8 hours.   Comment 1 Notify RN    Comment 2 Document in Chart   Glucose, capillary     Status: None   Collection Time: 07/15/23 11:31 PM  Result Value Ref Range   Glucose-Capillary 99 70 - 99 mg/dL    Comment: Glucose reference range applies only to samples taken after fasting for at least 8 hours.   Comment 1 Notify RN    Comment 2 Document in Chart   Glucose, capillary     Status: Abnormal   Collection Time: 07/16/23  3:06 AM  Result Value Ref Range   Glucose-Capillary 101 (H) 70 - 99 mg/dL    Comment: Glucose reference range applies only to samples taken after fasting for at least 8 hours.   Comment 1 Notify RN    Comment 2 Document in Chart   CBC     Status: Abnormal   Collection Time: 07/16/23  3:07 AM  Result Value Ref Range   WBC 18.3 (H) 4.0 - 10.5 K/uL   RBC 2.99 (L) 4.22 - 5.81 MIL/uL   Hemoglobin 8.6 (L) 13.0 - 17.0 g/dL   HCT 74.3 (L) 60.9 - 47.9 %   MCV 85.6  80.0 - 100.0 fL   MCH 28.8 26.0 - 34.0 pg   MCHC 33.6 30.0 - 36.0 g/dL   RDW 82.6 (H) 88.4 - 84.4 %   Platelets 235 150 - 400 K/uL   nRBC 0.0 0.0 - 0.2 %    Comment: Performed at [facility], [address] [place], [place] [address]  Comprehensive metabolic panel     Status: Abnormal   Collection Time: 07/16/23  3:07 AM  Result Value Ref Range   Sodium 131 (L) 135 -  145 mmol/L   Potassium 4.5 3.5 - 5.1 mmol/L   Chloride 99 98 - 111 mmol/L   CO2 19 (L) 22 - 32 mmol/L   Glucose, Bld 106 (H) 70 - 99 mg/dL    Comment: Glucose reference range applies only to samples taken after fasting for at least 8 hours.   BUN 61 (H) 8 - 23 mg/dL   Creatinine, Ser 7.43 (H) 0.61 - 1.24 mg/dL   Calcium  8.8 (L) 8.9 - 10.3 mg/dL   Total Protein 5.5 (L) 6.5 - 8.1 g/dL   Albumin 2.5 (L) 3.5 - 5.0 g/dL   AST 97 (H) 15 - 41 U/L   ALT 109 (H) 0 - 44 U/L   Alkaline Phosphatase 131 (H) 38 - 126 U/L   Total Bilirubin 1.5 (H) 0.0 - 1.2 mg/dL   GFR, Estimated 25 (L) >60 mL/min    Comment: (NOTE) Calculated using the CKD-EPI Creatinine Equation (2021)    Anion gap 13 5 - 15    Comment: Performed at [facility], [address], [place], [place] [address]  Glucose, capillary     Status: Abnormal   Collection Time: 07/16/23  7:38 AM  Result Value Ref Range   Glucose-Capillary 106 (H) 70 - 99 mg/dL    Comment: Glucose reference range applies only to samples taken after fasting for at least 8 hours.   Comment 1 Notify RN    Comment 2 Document in Chart   Troponin I (High Sensitivity)     Status: Abnormal   Collection Time: 07/16/23  8:01 AM  Result Value Ref Range   Troponin I (High Sensitivity) 2,264 (HH) <18 ng/L    Comment: CRITICAL RESULT CALLED TO, READ BACK BY AND VERIFIED WITH J. SMALL RN ON 07/16/2023 AT 0856 BY SL (NOTE) Elevated high sensitivity troponin I (hsTnI) values and significant  changes across serial measurements may suggest ACS but many other  chronic and acute conditions are known to elevate hsTnI results.  Refer to the Links section for chest pain algorithms and additional  guidance. Performed at [facility], [address] [place], [place] [address]   Lactic acid, plasma     Status: None   Collection Time: 07/16/23  8:01 AM  Result Value Ref Range   Lactic Acid, Venous 1.6 0.5 - 1.9 mmol/L    Comment:  Performed at [facility], [address], [place], [place] [address]  Basic metabolic panel     Status: Abnormal   Collection Time: 07/16/23  8:01 AM  Result Value Ref Range   Sodium 132 (L) 135 - 145 mmol/L   Potassium 4.4 3.5 - 5.1 mmol/L   Chloride 100 98 - 111 mmol/L   CO2 15 (L) 22 - 32 mmol/L   Glucose, Bld 112 (H) 70 - 99 mg/dL    Comment: Glucose reference range applies only to samples taken after fasting for at least 8 hours.  BUN 64 (H) 8 - 23 mg/dL   Creatinine, Ser 7.01 (H) 0.61 - 1.24 mg/dL   Calcium  8.7 (L) 8.9 - 10.3 mg/dL   GFR, Estimated 21 (L) >60 mL/min    Comment: (NOTE) Calculated using the CKD-EPI Creatinine Equation (2021)    Anion gap 17 (H) 5 - 15    Comment: Performed at [facility], [address] [place], [place] [address]  Magnesium      Status: None   Collection Time: 07/16/23  8:01 AM  Result Value Ref Range   Magnesium  2.4 1.7 - 2.4 mg/dL    Comment: Performed at [facility], [address] [place], [place] [address]    Imaging / Studies: ECHOCARDIOGRAM LIMITED Result Date: 07/15/2023    ECHOCARDIOGRAM LIMITED REPORT   Patient Name:   Nathan [name] Date of Exam: 07/15/2023 Medical Rec #:  [id number]      Height:       67.0 in Accession #:    [id number]     Weight:       128.1 lb Date of Birth:  12-03-1945     BSA:          1.673 m Patient Age:    77 years       BP:           102/65 mmHg Patient Gender: M              HR:           103 bpm. Exam Location:  Inpatient Procedure: Limited Echo, Color Doppler, Cardiac Doppler and Intracardiac            Opacification Agent Indications:    Congestive Heart Failure I50.9  History:        Patient has prior history of Echocardiogram examinations, most                 recent 03/06/2023. Aortic Valve Disease, Signs/Symptoms:Murmur;                 Risk Factors:Hypertension and Sleep Apnea.                 Aortic Valve: 23 mm Edwards bioprosthetic valve is  present in                 the aortic position.  Sonographer:    Jayson Gaskins Referring Phys: [id number] DORN NOVAK DEWALD IMPRESSIONS  1. Limited study.  2. Left ventricular ejection fraction, by estimation, is 35 to 40%. The left ventricle has moderately decreased function. The left ventricle demonstrates global hypokinesis, more prominent mid to distal anteroseptal wall. The left ventricular internal cavity size was mildly dilated.  3. No LV mural thrombus noted with Definity  contrast.  4. Right ventricular systolic function is normal. The right ventricular size is normal.  5. Left atrial size was severely dilated.  6. The mitral valve is degenerative. Moderate mitral annular calcification.  7. The aortic valve has been repaired/replaced. Aortic valve regurgitation is not visualized. There is a 23 mm Edwards bioprosthetic valve present in the aortic position. Aortic valve mean gradient measures 7.0 mmHg. Comparison(s): Prior images reviewed side by side. LVEF stable in range of 35-40%. FINDINGS  Left Ventricle: Left ventricular ejection fraction, by estimation, is 35 to 40%. The left ventricle has moderately decreased function. The left ventricle demonstrates global hypokinesis. The left ventricular internal cavity size was mildly dilated. There is no concentric left ventricular hypertrophy. Right Ventricle: The right ventricular size  is normal. No increase in right ventricular wall thickness. Right ventricular systolic function is normal. Left Atrium: Left atrial size was severely dilated. Mitral Valve: The mitral valve is degenerative in appearance. Moderate mitral annular calcification. Aortic Valve: The aortic valve has been repaired/replaced. Aortic valve regurgitation is not visualized. Aortic valve mean gradient measures 7.0 mmHg. Aortic valve peak gradient measures 14.1 mmHg. Aortic valve area, by VTI measures 1.50 cm. There is a 23 mm Edwards bioprosthetic valve present in the aortic position. LEFT  VENTRICLE PLAX 2D LVIDd:         5.70 cm LVIDs:         4.70 cm LV PW:         0.90 cm LV IVS:        0.80 cm LVOT diam:     1.80 cm LV SV:         45 LV SV Index:   27 LVOT Area:     2.54 cm  LEFT ATRIUM              Index LA Vol (A2C):   142.0 ml 84.86 ml/m LA Vol (A4C):   119.0 ml 71.11 ml/m LA Biplane Vol: 133.0 ml 79.48 ml/m  AORTIC VALVE AV Area (Vmax):    1.23 cm AV Area (Vmean):   1.34 cm AV Area (VTI):     1.50 cm AV Vmax:           188.00 cm/s AV Vmean:          129.000 cm/s AV VTI:            0.301 m AV Peak Grad:      14.1 mmHg AV Mean Grad:      7.0 mmHg LVOT Vmax:         90.70 cm/s LVOT Vmean:        68.100 cm/s LVOT VTI:          0.178 m LVOT/AV VTI ratio: 0.59  SHUNTS Systemic VTI:  0.18 m Systemic Diam: 1.80 cm Jayson Sierras MD Electronically signed by Jayson Sierras MD Signature Date/Time: 07/15/2023/5:30:13 PM    Final    NM Hepatobiliary Liver Func Result Date: 07/15/2023 CLINICAL DATA:  Cholecystitis. EXAM: NUCLEAR MEDICINE HEPATOBILIARY IMAGING TECHNIQUE: Sequential images of the abdomen were obtained out to 60 minutes following intravenous administration of radiopharmaceutical. RADIOPHARMACEUTICALS:  7.6 mCi Tc-12m  Choletec  IV COMPARISON:  03/02/2023 FINDINGS: Prompt uptake and biliary excretion of activity by the liver is seen. Biliary activity passes into small bowel, consistent with patent common bile duct. After the first hour of imaging there was no gallbladder activity visualized. Patient subsequently received 2 mg of morphine  intravenously. During the second hour of imaging gallbladder activity was visualized. IMPRESSION: 1. Delayed gallbladder filling is visualized during the second hour of imaging following the administration of morphine . Imaging findings are compatible with chronic calculus cholecystitis. 2. Patent common bile duct with biliary activity passing into the small bowel during the first hour of imaging. Electronically Signed   By: Waddell Calk M.D.   On:  07/15/2023 15:01   CT ABDOMEN PELVIS W CONTRAST Result Date: 07/14/2023 CLINICAL DATA:  Weakness and possible sepsis EXAM: CT ABDOMEN AND PELVIS WITH CONTRAST TECHNIQUE: Multidetector CT imaging of the abdomen and pelvis was performed using the standard protocol following bolus administration of intravenous contrast. RADIATION DOSE REDUCTION: This exam was performed according to the departmental dose-optimization program which includes automated exposure control, adjustment of the mA and/or kV according to  patient size and/or use of iterative reconstruction technique. CONTRAST:  75mL OMNIPAQUE  IOHEXOL  300 MG/ML  SOLN COMPARISON:  03/01/2023 FINDINGS: Lower chest: Mild interstitial edema is noted. No sizable effusion is seen. Hepatobiliary: Liver is within normal limits. Gallbladder is well distended with dependent calculus. Significant wall thickening and edema is noted consistent with acute cholecystitis till proven otherwise. Ultrasound may be helpful for further evaluation. The common bile duct is prominent measuring up to 11 mm greater than that expected for the patient's given age. No definitive distal CBD calculus is seen. Pancreas: Unremarkable. No pancreatic ductal dilatation or surrounding inflammatory changes. Spleen: Calcified granulomas are noted throughout the spleen. Adrenals/Urinary Tract: Adrenal glands are within normal limits. Bilateral renal cysts are noted which appear simple in nature and stable from previous exam. No follow-up is recommended. No renal calculi or obstructive changes are seen. The bladder is decompressed. Stomach/Bowel: Scattered diverticular change of the colon is noted. No evidence of diverticulitis is seen. The appendix appears within normal limits. Small bowel and stomach are unremarkable. Vascular/Lymphatic: Aortic atherosclerosis. No enlarged abdominal or pelvic lymph nodes. Reproductive: Prostate is unremarkable. Other: Mild free fluid is noted within the abdomen and  pelvis. Musculoskeletal: Degenerative changes of lumbar spine are noted. IMPRESSION: Inflammatory changes involving the gallbladder consistent with acute cholecystitis. Ultrasound may be helpful for further evaluation as necessary. Common bile duct dilatation is seen although no discrete distal calculus is noted. Diverticulosis without diverticulitis. Mild free fluid in the abdomen. Electronically Signed   By: Oneil Devonshire M.D.   On: 07/14/2023 21:57   DG Chest Portable 1 View Result Date: 07/14/2023 CLINICAL DATA:  Generalized weakness. EXAM: PORTABLE CHEST 1 VIEW COMPARISON:  X-ray 02/28/2023.  CT 03/01/2023.  Older exams as well. FINDINGS: Underinflation. Enlarged cardiac silhouette with calcified aorta. Sternal wires. Prosthetic valves. Slight interstitial changes identified with some subtle opacity left lung base. No pneumothorax or effusion. Old left-sided rib fractures. IMPRESSION: Underinflation. Postop chest. Enlarged cardiopericardial silhouette. Slight interstitial changes. Mild opacity left lung base. Recommend follow-up. Electronically Signed   By: Ranell Bring M.D.   On: 07/14/2023 19:39    Medications / Allergies: per chart  Antibiotics: Anti-infectives (From admission, onward)    Start     Dose/Rate Route Frequency Ordered Stop   07/15/23 1300  cefTRIAXone  (ROCEPHIN ) 2 g in sodium chloride  0.9 % 100 mL IVPB        2 g 200 mL/hr over 30 Minutes Intravenous Every 24 hours 07/15/23 1122     07/15/23 1000  linezolid  (ZYVOX ) IVPB 600 mg  Status:  Discontinued        600 mg 300 mL/hr over 60 Minutes Intravenous Every 12 hours 07/15/23 0217 07/15/23 1124   07/15/23 0600  piperacillin -tazobactam (ZOSYN ) IVPB 3.375 g  Status:  Discontinued        3.375 g 12.5 mL/hr over 240 Minutes Intravenous Every 8 hours 07/14/23 2230 07/15/23 1122   07/14/23 2215  metroNIDAZOLE  (FLAGYL ) IVPB 500 mg        500 mg 100 mL/hr over 60 Minutes Intravenous  Once 07/14/23 2208 07/14/23 2336   07/14/23 1945   vancomycin  (VANCOCIN ) IVPB 1000 mg/200 mL premix        1,000 mg 200 mL/hr over 60 Minutes Intravenous STAT 07/14/23 1937 07/14/23 2153   07/14/23 1915  ceFEPIme  (MAXIPIME ) 2 g in sodium chloride  0.9 % 100 mL IVPB        2 g 200 mL/hr over 30 Minutes Intravenous  Once 07/14/23 1906 07/14/23  2013         Note: Portions of this report may have been transcribed using voice recognition software. Every effort was made to ensure accuracy; however, inadvertent computerized transcription errors may be present.   Any transcriptional errors that result from this process are unintentional.    Elspeth KYM Schultze, MD, FACS, MASCRS Esophageal, Gastrointestinal & Colorectal Surgery Robotic and Minimally Invasive Surgery  [facility] [address] [place], [place] [address] [phone] Fax [phone] Main  CONTACT INFORMATION: Weekday (9AM-5PM): Call [facility] main office at [phone] Weeknight (5PM-9AM) or Weekend/Holiday: Check EPIC Web Links tab & use AMION (password  [email or web address]) for General Surgery [facility] coverage  Please, DO NOT use SecureChat  (it is not reliable communication to reach operating surgeons & will lead to a delay in care).   Epic staff messaging available for outptient concerns needing 1-2 business day response.      07/16/2023  9:38 AM

## 2023-07-16 NOTE — Progress Notes (Signed)
 Pharmacy: Re- heparin    Patient is a 78 y.o M who is currently on heparin  drip for NSTEMI.  First heparin  level collected at ~7p is therapeutic at 0.39 with drip infusing at 700 units/hr   Goal of Therapy:  Heparin  level 0.3-0.7 units/ml Monitor platelets by anticoagulation protocol: Yes  Plan: - continue heparin  drip at 700 units/hr - check another 8 hr heparin  level to ensure level is still therapeutic before changing to daily monitoring  - monitor for s/sx bleeding  Iantha Batch, PharmD, BCPS 07/16/2023 7:51 PM

## 2023-07-16 NOTE — Plan of Care (Signed)
  Problem: Metabolic: Goal: Ability to maintain appropriate glucose levels will improve Outcome: Progressing   Problem: Skin Integrity: Goal: Risk for impaired skin integrity will decrease Outcome: Progressing   Problem: Tissue Perfusion: Goal: Adequacy of tissue perfusion will improve Outcome: Progressing   Problem: Clinical Measurements: Goal: Respiratory complications will improve Outcome: Progressing Goal: Cardiovascular complication will be avoided Outcome: Progressing   Problem: Coping: Goal: Level of anxiety will decrease Outcome: Progressing

## 2023-07-16 NOTE — Progress Notes (Addendum)
 NAME:  Nathan [name], MRN:  [id number], DOB:  1945-10-31, LOS: 1 ADMISSION DATE:  07/14/2023, CONSULTATION DATE:  1/4 REFERRING MD:  Dr. Jerral, CHIEF COMPLAINT:  septic shock; acute cholecystitis  History of Present Illness:  Patient is a 78 year old male with pertinent PMH previous cholecystitis (had perc choley drain placed 03/02/2023), DMT2, HTN, HLD, PAF not on AC due previous bleeding, systolic CHF, CAD s/p CABG and AVR, CKD 3a, OSA presents to [facility] ED on 1/3 with abdominal pain/sepsis.  Patient last admitted 02/2023 with chest pain.  Initially concern of MI with some acute systolic CHF.  Patient also with abdominal pain and imaging showing cholecystitis.  Per choley drain placed. Discharged on IV abx.  Over the past 3 days patient having generalized weakness and confusion.  On 1/3 went to [facility] urgent care and BP 68/42.  Was recommended to come to [facility] ED for further eval.  Initial BP 109/72.  Sats stable on room air.  Afebrile.  Patient having some abdominal pain.  CXR showing mild opacity of LLL.  CT ABD/pelvis showing inflammatory changes of gallbladder consistent with acute cholecystitis.  UA clear.  WBC 24.3 and LA 5.7.  Patient given IV fluids, cultures obtained, and started on broad-spectrum antibiotics.  Surgery consulted and no plan for emergent surgery at this time.  Despite IV fluids patient remained hypotensive requiring Levophed .  PCCM consulted for ICU admission.  Pertinent ED labs: Hgb 10.1, NA 134, CO2 16, glucose 169, creat 1.96 (baseline 1.5-1.9), AST 221, ALT 182, alk phos 178, bilirubin 2.5, AG 18, troponin 18 and 16, BNP 2349  Pertinent  Medical History   Past Medical History:  Diagnosis Date   Allergy    Aortic stenosis    Arthritis    Balance problem 04/26/2022   Blood transfusion without reported diagnosis    CAD (coronary artery disease)    Cataract    both eyes surgically removed   Diabetes mellitus, type 2 (HCC)    Diverticulosis of colon (without  mention of hemorrhage)    Duodenal ulcer perforation (HCC)    blood transfusion   Flesh-eating bacteria (HCC) 1995   GERD (gastroesophageal reflux disease)    Glaucoma    pre glaucoma on Xalatan  drops   Heart murmur    History of diabetic retinopathy    History of gastrointestinal bleeding    History of necrotizing fasciitis    History of prosthetic aortic valve    Hypercholesteremia    Myocardial infarction (HCC) 02/23/2016   Neuropathic pain 04/26/2022   Nightmares    CHRONIC   OSA (obstructive sleep apnea)    borderline no CPAP    Personal history of colonic polyps 11/23/2009   TUBULAR ADENOMA   Postoperative anemia    Sinus bradycardia    Sleep apnea    no cpap   Stomach ulcer    Hx of   Systolic hypertension    Vitamin B12 deficiency     Significant Hospital Events: Including procedures, antibiotic start and stop dates in addition to other pertinent events   1/4 admitted w/ septic shock 2/2 cholecystitis; pccm consulted for icu admission 1/5  Interim History / Subjective:   Weaned off pressors yesterday afternoon. Did not require restart overnight Complaining of chest pain <5 min x 2 episodes that self resolved  Objective   Blood pressure (!) 113/57, pulse 99, temperature 97.9 F (36.6 C), temperature source Oral, resp. rate 20, height 5' 7 (1.702 m), weight 60.3 kg, SpO2 94%.  Intake/Output Summary (Last 24 hours) at 07/16/2023 0800 Last data filed at 07/15/2023 2343 Gross per 24 hour  Intake 1772.28 ml  Output 150 ml  Net 1622.28 ml   Filed Weights   07/14/23 1728 07/15/23 0205 07/16/23 0500  Weight: 54.4 kg 58.1 kg 60.3 kg   Physical Exam: General: Chronically ill-appearing, no acute distress HENT: [place], AT, OP clear, dry MM Eyes: EOMI, mild scleral icterus Respiratory: Clear to auscultation bilaterally.  No crackles, wheezing or rales Cardiovascular: Irrgular rate and rhythm, rate controlled, -M/R/G, no JVD GI: BS+, soft,  nontender Extremities:-Edema,-tenderness Neuro: AAO x4, CNII-XII grossly intact   Resolved Hospital Problem list     Assessment & Plan:  Septic shock secondary to E.coli bacteremia in setting of chronic calculus cholecystitis - shock resolved Hida scan neg for obstruction, delayed filling during 2nd hour - Off pressors. Goal 65 or greater - S/p 3L of IV fluids - De-escalated to Ceftriaxone  - F/u culture sensitivities - Repeat blood cultures tomorrow for clearance  Acute on Chronic Systolic Heart Failure (EF 35-40%) Atrial Fibrillation CAD, aortic stenosis s/p CABG x 3 with AVR on 10/2008  Echo unchanged - appears dry at this time - hold off on diuretics - not on chronic anticoagulation for a. fib - Ordered trop, bmet, mg, LA for transient chest pain. EKG with rate controlledafib  >Cardiology consulted for elevated trop 2264. LA cleared. Worsening Cr. IVF as noted below -Trend troponin. Repeat EKG  AKI with metabolic acidosis- worsening Lactic Acidosis - resolved - Trend UOP/Cr - trend lactic acid level - Gentle IV hydration x 12 hours - Check ABG  Elevated Liver enzymes - improving - monitor  Melanoma  - has appointment at [facility] on 1/8 to see specialist  DMII - SSI  Best Practice (right click and Reselect all SmartList Selections daily)   Diet/type: Regular consistency (see orders) DVT prophylaxis prophylactic heparin   Pressure ulcer(s): N/A GI prophylaxis: N/A Lines: N/A Foley:  N/A Code Status:  DNR Last date of multidisciplinary goals of care discussion [1/4 updated wife in person, confirms DNR status]   Critical care time: 35 min    The patient is critically ill with multiple organ systems failure and requires high complexity decision making for assessment and support, frequent evaluation and titration of therapies, application of advanced monitoring technologies and extensive interpretation of multiple databases.  Independent Critical Care Time: 35  Minutes.   Slater Staff, M.D. [facility] Pulmonary/Critical Care Medicine 07/16/2023 9:13 AM   Please see Amion for pager number to reach on-call Pulmonary and Critical Care Team.

## 2023-07-16 NOTE — Progress Notes (Signed)
 PHARMACY - ANTICOAGULATION CONSULT NOTE  Pharmacy Consult for heparin  Indication: chest pain/ACS  Allergies  Allergen Reactions   Shellfish-Derived Products Anaphylaxis   Nsaids Other (See Comments)    H/o gastric ulcers = NO NSAIDs per GI   Zolpidem Tartrate Other (See Comments)    Hallucinations    Patient Measurements: Height: 5' 7 (170.2 cm) Weight: 60.3 kg (132 lb 15 oz) IBW/kg (Calculated) : 66.1 Heparin  Dosing Weight: 60 kg  Vital Signs: Temp: 97.5 F (36.4 C) (01/05 0800) Temp Source: Axillary (01/05 0800) BP: 118/63 (01/05 0945) Pulse Rate: 101 (01/05 0945)  Labs: Recent Labs    07/14/23 1847 07/14/23 1942 07/14/23 2351 07/15/23 0257 07/15/23 1148 07/16/23 0307 07/16/23 0801  HGB 10.1*  --   --  9.3*  --  8.6*  --   HCT 29.2*  --   --  29.2*  --  25.6*  --   PLT 263  --   --  284  --  235  --   LABPROT  --   --  18.5* 18.5*  --   --   --   INR  --   --  1.5* 1.5*  --   --   --   CREATININE 1.96*  --   --  1.82* 2.25* 2.56* 2.98*  TROPONINIHS 18* 16  --   --   --   --  2,264*    Estimated Creatinine Clearance: 17.4 mL/min (A) (by C-G formula based on SCr of 2.98 mg/dL (H)).   Medical History: Past Medical History:  Diagnosis Date   Allergy    Aortic stenosis    Arthritis    Balance problem 04/26/2022   Blood transfusion without reported diagnosis    CAD (coronary artery disease)    Cataract    both eyes surgically removed   Diabetes mellitus, type 2 (HCC)    Diverticulosis of colon (without mention of hemorrhage)    Duodenal ulcer perforation (HCC)    blood transfusion   Flesh-eating bacteria (HCC) 1995   GERD (gastroesophageal reflux disease)    Glaucoma    pre glaucoma on Xalatan  drops   Heart murmur    History of diabetic retinopathy    History of gastrointestinal bleeding    History of necrotizing fasciitis    History of prosthetic aortic valve    Hypercholesteremia    Myocardial infarction (HCC) 02/23/2016   Neuropathic pain  04/26/2022   Nightmares    CHRONIC   OSA (obstructive sleep apnea)    borderline no CPAP    Personal history of colonic polyps 11/23/2009   TUBULAR ADENOMA   Postoperative anemia    Sinus bradycardia    Sleep apnea    no cpap   Stomach ulcer    Hx of   Systolic hypertension    Vitamin B12 deficiency      Assessment: 78 year old male presented with abdominal pain. History of cholecystitis required perc chole drain placement in August. He was found to have E.coli bacteremia this admission and is on antibiotics. He has a history of atrial fibrillation but is not on anticoagulation given a history of bleeding. Patient with chest pain 1/5, now with significant elevation in troponin. Cardiology consulted, recommend heparin  infusion and cycling troponins. Some concern for NSTEMI in setting of physiologic stressors but not a good candidate for cardiac cath at this time. Pharmacy to manage heparin  infusion.  Hgb trending down some. Plt relatively stable.   Goal of Therapy:  Heparin  level  0.3-0.7 units/ml Monitor platelets by anticoagulation protocol: Yes   Plan:  -Heparin  3000 unit bolus (slightly reduced in setting of earlier subcu heparin  administration) -Start heparin  infusion at 700 units/hr -Check 8 hour heparin  level -Daily CBC while on infusion  Stefano MARLA Bologna, PharmD, BCPS Clinical Pharmacist 07/16/2023 11:02 AM

## 2023-07-16 NOTE — Final Consult Note (Signed)
 Cardiology Consultation   Patient ID: Nathan [name] MRN: [id number]; DOB: 03/15/1946  Admit date: 07/14/2023 Date of Consult: 07/16/2023  PCP:  Yolande Toribio MATSU, MD   [facility] Providers Cardiologist:  Oneil Parchment, MD   {   Patient Profile:   Nathan [name] is a 78 y.o. male with a history of CAD s/p CABG and AVR in 2010, chronic HFrEF with EF of 35-40% on Echo in 02/2023, permanent atrial fibrillation not on anticoagulation due bleeding issues in the past, hypertension, hyperlipidemia, type 2 diabetes mellitus, obstructive sleep apnea not on CPAP,  necrotizing fasciitis, GERD, and prior GI bleed in 2010 due to duodenal ulcer perforation  who is being seen 07/16/2023 for the evaluation of chest discomfort and abnormal high-sensitivity troponin I levels at the request of Dr. Kassie.  History of Present Illness:   Nathan [name] is a 78 year old male with the above history who is followed by Dr. Parchment. He has a history of CAD s/p CABG x4 and AVR in 2010.  Last ischemic evaluation was a cardiac catheterization in 02/2016 which showed severe 3 vessel native CAD with a patent LIMA to LAD and SVG to OM2 but occluded SVG to PDA. The distal RCA was occluded but filled by left to right collaterals. Continue medical therapy was recommended.  He was admitted in 02/2023 for NSTEMI after presenting with chest pain. High-sensitivity troponin peaked at 6,445. Echo showed LVEF of of LVEF of 20-25% with global hypokinesis. Hospitalization was complicated by new onset atrial fibrillation, AKI superimposed on CKD, pneumonia, acute cholecystitis, spontaneous hematoma of adductor muscle with acute blood loss anemia requiring multiple blood transfusions, and encephalopathy/ delirium. Initial plans were for [facility] but due to all the above complications, he was treated medically. Repeat limited Echo prior to discharge showed LVEF of 35-40% with global hypokinesis, normal RV size and function, normal functioning  AVR, and mild MR. His acute cholecystitis was treated with antibiotics and a percutaneous cholecystostomy tube. 2 week Zio monitor in 04/2023 showed continuous atrial fibrillation (100% burden) with average heart rate of 73 bpm and 6 short runs of NSVT (longest run 12 beats). However, he was not started on anticoagulation given spontaneous bleeding in the past requiring multiple of units or PRBCs.   He was last seen by Dr. Parchment on 06/26/2023 at which time he described some mobility issues but was doing well from a cardiac standpoint with no chest pain or shortness of breath.  Patient presented to an Urgent Care on 07/14/2023 for further evaluation of generalized weakness and was found to be hypotensive with BP of 68/42. He was transferred to the [facility] ED. He was given IV fluids by EMS. Upon arrival to the ED, BP had improved to 109/72. He was complaining of some abdominal pain.  Chest x-ray showed a mild opacity of the the left lower lobe. Abdominal/ pelvic CT showed inflammatory changes of gallbladder consistent with acute cholecystitis. BNP markedly elevated at 2,349. High-sensitivity troponin minimally elevated and flat at 18 >> 16. WBC 24.3, Hgb 10.1, Plts 263. Na 134, K 4.5, Glucose 168, BUN 48, Cr 1.96. Albumin 3.2, AST 221, ALT 192, Alk Phos 178, Total Bili 2.5. Lipase normal.  General Surgery was consulted and there was not felt to be any need for emergent surgery. He was started on IV fluids and antibiotics. He remained hypotensive so ultimately had to be started on Levophed  and admitted for septic shock. Blood cultures later came back positive for Enterobacterales and  E. Coli. HIDA scan was negative for obstruction but there was delayed filling in the 2nd hour.  Limited Echo showed LVEF of 35-40% with global hypokinesis, normal RV size and function, severe left atrial enlargement, and stable AVR. He was able to be weaned off pressors on 1/4.   Today patient reported chest discomfort to nursing  this morning, two separate episodes lasting less than a minute each on the right side of his lower anterior chest wall.  No specific intervention required for symptoms to resolve.  States that this is somewhat similar to prior angina.  ECG shows atrial fibrillation with RVR, IVCD with diffuse ST-T wave abnormalities that are old.  High-sensitivity troponin I level obtained by CCM team and elevated at 2264.  On my interview patient reported no further chest discomfort.  Past Medical History:  Diagnosis Date   Allergy    Aortic stenosis    Arthritis    Balance problem 04/26/2022   Blood transfusion without reported diagnosis    CAD (coronary artery disease)    Cataract    both eyes surgically removed   Diabetes mellitus, type 2 (HCC)    Diverticulosis of colon (without mention of hemorrhage)    Duodenal ulcer perforation (HCC)    blood transfusion   Flesh-eating bacteria (HCC) 1995   GERD (gastroesophageal reflux disease)    Glaucoma    pre glaucoma on Xalatan  drops   Heart murmur    History of diabetic retinopathy    History of gastrointestinal bleeding    History of necrotizing fasciitis    History of prosthetic aortic valve    Hypercholesteremia    Myocardial infarction (HCC) 02/23/2016   Neuropathic pain 04/26/2022   Nightmares    CHRONIC   OSA (obstructive sleep apnea)    borderline no CPAP    Personal history of colonic polyps 11/23/2009   TUBULAR ADENOMA   Postoperative anemia    Sinus bradycardia    Sleep apnea    no cpap   Stomach ulcer    Hx of   Systolic hypertension    Vitamin B12 deficiency     Past Surgical History:  Procedure Laterality Date   AORTIC VALVE REPLACEMENT     BLEPHAROPLASTY  11/24/2014   CARDIAC CATHETERIZATION N/A 03/01/2016   Procedure: Coronary/Graft Angiography;  Surgeon: Peter M Jordan, MD;  Location: [facility] INVASIVE CV LAB;  Service: Cardiovascular;  Laterality: N/A;   CATARACT EXTRACTION W/ INTRAOCULAR LENS IMPLANT     OD   COLONOSCOPY      CORONARY ARTERY BYPASS GRAFT  2010   x 4   flesh eating disease surgery      surgeries x 7    HERNIA REPAIR  1972   IR EXCHANGE BILIARY DRAIN  03/07/2023   IR EXCHANGE BILIARY DRAIN  04/18/2023   IR PERC CHOLECYSTOSTOMY  03/02/2023   POLYPECTOMY     RETINAL LASER PROCEDURE     SCROTAL SURGERY  1995   resection   UMBILICAL HERNIA REPAIR N/A 04/10/2013   open UHR w Ventralex mesh patch - Dr Gladis     Inpatient Medications: Scheduled Meds:  bisacodyl   10 mg Rectal Daily   Chlorhexidine  Gluconate Cloth  6 each Topical Daily   heparin   5,000 Units Subcutaneous Q8H   insulin  aspart  0-9 Units Subcutaneous Q4H   polycarbophil  625 mg Oral BID   Continuous Infusions:  cefTRIAXone  (ROCEPHIN )  IV Stopped (07/15/23 1525)   lactated ringers      PRN Meds: acetaminophen , acetaminophen ,  alum & mag hydroxide-simeth, docusate sodium , HYDROmorphone  (DILAUDID ) injection, magic mouthwash, ondansetron  (ZOFRAN ) IV, polyethylene glycol, prochlorperazine , simethicone , sodium chloride , traMADol   Allergies:    Allergies  Allergen Reactions   Shellfish-Derived Products Anaphylaxis   Nsaids Other (See Comments)    H/o gastric ulcers = NO NSAIDs per GI   Zolpidem Tartrate Other (See Comments)    Hallucinations     Family History:   Family History  Problem Relation Age of Onset   Cirrhosis Father    Alcohol  abuse Father        father murdered   Rectal cancer Mother    Diabetes Mother    Colon cancer Mother 35       rectal cancer   Hypertension Mother    Hypertension Sister    Diabetes Sister    Heart attack Neg Hx    Stroke Neg Hx    Colon polyps Neg Hx    Esophageal cancer Neg Hx    Stomach cancer Neg Hx     Social History: Social History   Tobacco Use   Smoking status: Never   Smokeless tobacco: Never  Substance Use Topics   Alcohol  use: No    Alcohol /week: 0.0 standard drinks of alcohol      ROS:  Please see the history of present illness. All other ROS reviewed and  negative.     Physical Exam/Data:   Vitals:   07/16/23 0715 07/16/23 0730 07/16/23 0745 07/16/23 0800  BP: 135/65 126/73 130/70 133/80  Pulse: (!) 121 (!) 106 (!) 115 (!) 111  Resp: (!) 23 (!) 28 (!) 25 (!) 23  Temp:    (!) 97.5 F (36.4 C)  TempSrc:    Axillary  SpO2: 91% 97% 93% 94%  Weight:      Height:        Intake/Output Summary (Last 24 hours) at 07/16/2023 0911 Last data filed at 07/15/2023 2343 Gross per 24 hour  Intake 1727.3 ml  Output 150 ml  Net 1577.3 ml      07/16/2023    5:00 AM 07/15/2023    2:05 AM 07/14/2023    5:28 PM  Last 3 Weights  Weight (lbs) 132 lb 15 oz 128 lb 1.4 oz 120 lb  Weight (kg) 60.3 kg 58.1 kg 54.432 kg     Body mass index is 20.82 kg/m.  HEENT: Conjunctiva and lids normal, oropharynx clear. Neck: Supple, no elevated JVP. Lungs: Clear to auscultation anteriorly, nonlabored breathing at rest. Cardiac: Irregularly irregular, 1/6 systolic murmur without gallop or rub. Abdomen: Soft, nontender, bowel sounds present. Extremities: No pitting edema. Skin: Warm and dry. Musculoskeletal: No kyphosis. Neuropsychiatric: Alert and oriented x2, affect grossly appropriate.   Telemetry:  Telemetry was personally reviewed and demonstrates: Atrial fibrillation.  Laboratory Data:  High Sensitivity Troponin:   Recent Labs  Lab 07/14/23 1847 07/14/23 1942 07/16/23 0801  TROPONINIHS 18* 16 2,264*     Chemistry Recent Labs  Lab 07/15/23 0257 07/15/23 0259 07/15/23 1148 07/16/23 0307 07/16/23 0801  NA 130*  --  131* 131* 132*  K 4.6 4.8 4.4 4.5 4.4  CL 99  --  99 99 100  CO2 15*  --  16* 19* 15*  GLUCOSE 157*  --  166* 106* 112*  BUN 50*  --  54* 61* 64*  CREATININE 1.82*  --  2.25* 2.56* 2.98*  CALCIUM  8.9  --  9.0 8.8* 8.7*  MG 1.4* 1.4*  --   --  2.4  GFRNONAA 38*  --  29* 25* 21*  ANIONGAP 16*  --  16* 13 17*    Recent Labs  Lab 07/14/23 1847 07/15/23 0257 07/16/23 0307  PROT 6.7 6.1* 5.5*  ALBUMIN 3.2* 2.9* 2.5*  AST 221*  151* 97*  ALT 192* 153* 109*  ALKPHOS 178* 149* 131*  BILITOT 2.5* 3.5* 1.5*   Hematology Recent Labs  Lab 07/14/23 1847 07/15/23 0257 07/16/23 0307  WBC 24.3* 27.0* 18.3*  RBC 3.38* 3.26* 2.99*  HGB 10.1* 9.3* 8.6*  HCT 29.2* 29.2* 25.6*  MCV 86.4 89.6 85.6  MCH 29.9 28.5 28.8  MCHC 34.6 31.8 33.6  RDW 17.2* 17.8* 17.3*  PLT 263 284 235    BNP Recent Labs  Lab 07/14/23 1847  BNP 2,349.7*     Radiology/Studies:  ECHOCARDIOGRAM LIMITED Result Date: 07/15/2023    ECHOCARDIOGRAM LIMITED REPORT   Patient Name:   NAMEER SUMMER Date of Exam: 07/15/2023 Medical Rec #:  [id number]      Height:       67.0 in Accession #:    [id number]     Weight:       128.1 lb Date of Birth:  13-Sep-1945     BSA:          1.673 m Patient Age:    77 years       BP:           102/65 mmHg Patient Gender: M              HR:           103 bpm. Exam Location:  Inpatient Procedure: Limited Echo, Color Doppler, Cardiac Doppler and Intracardiac            Opacification Agent Indications:    Congestive Heart Failure I50.9  History:        Patient has prior history of Echocardiogram examinations, most                 recent 03/06/2023. Aortic Valve Disease, Signs/Symptoms:Murmur;                 Risk Factors:Hypertension and Sleep Apnea.                 Aortic Valve: 23 mm Edwards bioprosthetic valve is present in                 the aortic position.  Sonographer:    Jayson Gaskins Referring Phys: [id number] DORN NOVAK DEWALD IMPRESSIONS  1. Limited study.  2. Left ventricular ejection fraction, by estimation, is 35 to 40%. The left ventricle has moderately decreased function. The left ventricle demonstrates global hypokinesis, more prominent mid to distal anteroseptal wall. The left ventricular internal cavity size was mildly dilated.  3. No LV mural thrombus noted with Definity  contrast.  4. Right ventricular systolic function is normal. The right ventricular size is normal.  5. Left atrial size was severely dilated.  6. The  mitral valve is degenerative. Moderate mitral annular calcification.  7. The aortic valve has been repaired/replaced. Aortic valve regurgitation is not visualized. There is a 23 mm Edwards bioprosthetic valve present in the aortic position. Aortic valve mean gradient measures 7.0 mmHg. Comparison(s): Prior images reviewed side by side. LVEF stable in range of 35-40%. FINDINGS  Left Ventricle: Left ventricular ejection fraction, by estimation, is 35 to 40%. The left ventricle has moderately decreased function. The left ventricle demonstrates global hypokinesis. The left ventricular internal cavity size was mildly dilated. There is no concentric left  ventricular hypertrophy. Right Ventricle: The right ventricular size is normal. No increase in right ventricular wall thickness. Right ventricular systolic function is normal. Left Atrium: Left atrial size was severely dilated. Mitral Valve: The mitral valve is degenerative in appearance. Moderate mitral annular calcification. Aortic Valve: The aortic valve has been repaired/replaced. Aortic valve regurgitation is not visualized. Aortic valve mean gradient measures 7.0 mmHg. Aortic valve peak gradient measures 14.1 mmHg. Aortic valve area, by VTI measures 1.50 cm. There is a 23 mm Edwards bioprosthetic valve present in the aortic position. LEFT VENTRICLE PLAX 2D LVIDd:         5.70 cm LVIDs:         4.70 cm LV PW:         0.90 cm LV IVS:        0.80 cm LVOT diam:     1.80 cm LV SV:         45 LV SV Index:   27 LVOT Area:     2.54 cm  LEFT ATRIUM              Index LA Vol (A2C):   142.0 ml 84.86 ml/m LA Vol (A4C):   119.0 ml 71.11 ml/m LA Biplane Vol: 133.0 ml 79.48 ml/m  AORTIC VALVE AV Area (Vmax):    1.23 cm AV Area (Vmean):   1.34 cm AV Area (VTI):     1.50 cm AV Vmax:           188.00 cm/s AV Vmean:          129.000 cm/s AV VTI:            0.301 m AV Peak Grad:      14.1 mmHg AV Mean Grad:      7.0 mmHg LVOT Vmax:         90.70 cm/s LVOT Vmean:        68.100  cm/s LVOT VTI:          0.178 m LVOT/AV VTI ratio: 0.59  SHUNTS Systemic VTI:  0.18 m Systemic Diam: 1.80 cm Jayson Sierras MD Electronically signed by Jayson Sierras MD Signature Date/Time: 07/15/2023/5:30:13 PM    Final    NM Hepatobiliary Liver Func Result Date: 07/15/2023 CLINICAL DATA:  Cholecystitis. EXAM: NUCLEAR MEDICINE HEPATOBILIARY IMAGING TECHNIQUE: Sequential images of the abdomen were obtained out to 60 minutes following intravenous administration of radiopharmaceutical. RADIOPHARMACEUTICALS:  7.6 mCi Tc-66m  Choletec  IV COMPARISON:  03/02/2023 FINDINGS: Prompt uptake and biliary excretion of activity by the liver is seen. Biliary activity passes into small bowel, consistent with patent common bile duct. After the first hour of imaging there was no gallbladder activity visualized. Patient subsequently received 2 mg of morphine  intravenously. During the second hour of imaging gallbladder activity was visualized. IMPRESSION: 1. Delayed gallbladder filling is visualized during the second hour of imaging following the administration of morphine . Imaging findings are compatible with chronic calculus cholecystitis. 2. Patent common bile duct with biliary activity passing into the small bowel during the first hour of imaging. Electronically Signed   By: Waddell Calk M.D.   On: 07/15/2023 15:01   CT ABDOMEN PELVIS W CONTRAST Result Date: 07/14/2023 CLINICAL DATA:  Weakness and possible sepsis EXAM: CT ABDOMEN AND PELVIS WITH CONTRAST TECHNIQUE: Multidetector CT imaging of the abdomen and pelvis was performed using the standard protocol following bolus administration of intravenous contrast. RADIATION DOSE REDUCTION: This exam was performed according to the departmental dose-optimization program which includes automated exposure control, adjustment  of the mA and/or kV according to patient size and/or use of iterative reconstruction technique. CONTRAST:  75mL OMNIPAQUE  IOHEXOL  300 MG/ML  SOLN COMPARISON:   03/01/2023 FINDINGS: Lower chest: Mild interstitial edema is noted. No sizable effusion is seen. Hepatobiliary: Liver is within normal limits. Gallbladder is well distended with dependent calculus. Significant wall thickening and edema is noted consistent with acute cholecystitis till proven otherwise. Ultrasound may be helpful for further evaluation. The common bile duct is prominent measuring up to 11 mm greater than that expected for the patient's given age. No definitive distal CBD calculus is seen. Pancreas: Unremarkable. No pancreatic ductal dilatation or surrounding inflammatory changes. Spleen: Calcified granulomas are noted throughout the spleen. Adrenals/Urinary Tract: Adrenal glands are within normal limits. Bilateral renal cysts are noted which appear simple in nature and stable from previous exam. No follow-up is recommended. No renal calculi or obstructive changes are seen. The bladder is decompressed. Stomach/Bowel: Scattered diverticular change of the colon is noted. No evidence of diverticulitis is seen. The appendix appears within normal limits. Small bowel and stomach are unremarkable. Vascular/Lymphatic: Aortic atherosclerosis. No enlarged abdominal or pelvic lymph nodes. Reproductive: Prostate is unremarkable. Other: Mild free fluid is noted within the abdomen and pelvis. Musculoskeletal: Degenerative changes of lumbar spine are noted. IMPRESSION: Inflammatory changes involving the gallbladder consistent with acute cholecystitis. Ultrasound may be helpful for further evaluation as necessary. Common bile duct dilatation is seen although no discrete distal calculus is noted. Diverticulosis without diverticulitis. Mild free fluid in the abdomen. Electronically Signed   By: Oneil Devonshire M.D.   On: 07/14/2023 21:57   DG Chest Portable 1 View Result Date: 07/14/2023 CLINICAL DATA:  Generalized weakness. EXAM: PORTABLE CHEST 1 VIEW COMPARISON:  X-ray 02/28/2023.  CT 03/01/2023.  Older exams as well.  FINDINGS: Underinflation. Enlarged cardiac silhouette with calcified aorta. Sternal wires. Prosthetic valves. Slight interstitial changes identified with some subtle opacity left lung base. No pneumothorax or effusion. Old left-sided rib fractures. IMPRESSION: Underinflation. Postop chest. Enlarged cardiopericardial silhouette. Slight interstitial changes. Mild opacity left lung base. Recommend follow-up. Electronically Signed   By: Ranell Bring M.D.   On: 07/14/2023 19:39    Assessment and Plan:   1.  Recent brief episodes of chest discomfort with somewhat atypical features, however associated with increase in high-sensitivity troponin I levels to 2264 (enzymes at presentation were 16-18).  ECG shows fairly chronic diffuse ST-T wave abnormalities.  Consistent with NSTEMI, type I versus type II (would favor the latter in light of active comorbidities).  He has known multivessel CAD status post CABG in 2010, managed medically for NSTEMI in August 2024.  2.  HFrEF, follow-up echocardiogram yesterday shows stable LVEF in the range of 35 to 40% with global hypokinesis however more prominent in the mid to distal anteroseptal wall.  No LV thrombus noted with Definity  contrast.  3.  History of aortic stenosis status post 23 mm Edwards bioprosthetic AVR in 2010 at the time of CABG.  Mean gradient 7 mmHg with grossly normal function by echocardiogram yesterday.  4.  Permanent atrial fibrillation managed with heart rate control strategy.  He is not anticoagulated given prior history of spontaneous bleeding and PRBC transfusions.  5.  Septic shock with E. coli bacteremia.  Does have history of chronic calculus cholecystitis, but no substantial abnormalities by HIDA scan at present.  Followed by surgical team and recommending medical management at this time.  He has been stabilized with IV fluids, weaned off pressors and continues with supportive  measures per CCM team.  Hemodynamically more stable at this point.   He continues on antibiotics.  6.  Acute kidney injury, creatinine trend 2.56 up to 2.98 in the last 24 hours.  Chart reviewed, case discussed with Dr. Kassie and nursing.  Recommend switching from [place] heparin  to IV heparin , continue to cycle cardiac enzymes and follow-up ECG.  No definite change in LVEF by follow-up echocardiogram.  Still could be type II NSTEMI in the setting of other physiologic stressors.  He is not a good candidate for diagnostic cardiac catheterization at this time with worsening renal function and a very high risk of contrast-induced nephropathy.  Resume aspirin  81 mg daily.  Also start low-dose Lopressor  to try and obtain better heart rate control of atrial fibrillation as this may also help with recurrent angina.  Cardiology service will continue to follow.   Risk Assessment/Risk Scores:    For questions or updates, please contact [facility] Please consult [email or web address] for contact info under    Signed, Jayson JUDITHANN Sierras, M.D., F.A.C.C.

## 2023-07-17 DIAGNOSIS — A4151 Sepsis due to Escherichia coli [E. coli]: Secondary | ICD-10-CM | POA: Diagnosis not present

## 2023-07-17 DIAGNOSIS — R6521 Severe sepsis with septic shock: Secondary | ICD-10-CM

## 2023-07-17 DIAGNOSIS — R2689 Other abnormalities of gait and mobility: Secondary | ICD-10-CM

## 2023-07-17 DIAGNOSIS — I10 Essential (primary) hypertension: Secondary | ICD-10-CM | POA: Diagnosis not present

## 2023-07-17 DIAGNOSIS — E1169 Type 2 diabetes mellitus with other specified complication: Secondary | ICD-10-CM

## 2023-07-17 DIAGNOSIS — I4891 Unspecified atrial fibrillation: Secondary | ICD-10-CM | POA: Diagnosis not present

## 2023-07-17 DIAGNOSIS — I5023 Acute on chronic systolic (congestive) heart failure: Secondary | ICD-10-CM

## 2023-07-17 DIAGNOSIS — R7989 Other specified abnormal findings of blood chemistry: Secondary | ICD-10-CM | POA: Diagnosis not present

## 2023-07-17 DIAGNOSIS — E785 Hyperlipidemia, unspecified: Secondary | ICD-10-CM

## 2023-07-17 DIAGNOSIS — N179 Acute kidney failure, unspecified: Secondary | ICD-10-CM | POA: Diagnosis not present

## 2023-07-17 DIAGNOSIS — K8010 Calculus of gallbladder with chronic cholecystitis without obstruction: Secondary | ICD-10-CM | POA: Diagnosis not present

## 2023-07-17 DIAGNOSIS — N170 Acute kidney failure with tubular necrosis: Secondary | ICD-10-CM

## 2023-07-17 LAB — COMPREHENSIVE METABOLIC PANEL
ALT: 87 U/L — ABNORMAL HIGH (ref 0–44)
AST: 64 U/L — ABNORMAL HIGH (ref 15–41)
Albumin: 2.3 g/dL — ABNORMAL LOW (ref 3.5–5.0)
Alkaline Phosphatase: 142 U/L — ABNORMAL HIGH (ref 38–126)
Anion gap: 16 — ABNORMAL HIGH (ref 5–15)
BUN: 71 mg/dL — ABNORMAL HIGH (ref 8–23)
CO2: 16 mmol/L — ABNORMAL LOW (ref 22–32)
Calcium: 8.7 mg/dL — ABNORMAL LOW (ref 8.9–10.3)
Chloride: 100 mmol/L (ref 98–111)
Creatinine, Ser: 3.17 mg/dL — ABNORMAL HIGH (ref 0.61–1.24)
GFR, Estimated: 19 mL/min — ABNORMAL LOW (ref >60)
Glucose, Bld: 132 mg/dL — ABNORMAL HIGH (ref 70–99)
Potassium: 4.3 mmol/L (ref 3.5–5.1)
Sodium: 132 mmol/L — ABNORMAL LOW (ref 135–145)
Total Bilirubin: 1.4 mg/dL — ABNORMAL HIGH (ref 0.0–1.2)
Total Protein: 5.5 g/dL — ABNORMAL LOW (ref 6.5–8.1)

## 2023-07-17 LAB — HEMOGLOBIN A1C
Hgb A1c MFr Bld: 6.8 % — ABNORMAL HIGH (ref 4.8–5.6)
Mean Plasma Glucose: 148 mg/dL

## 2023-07-17 LAB — CULTURE, BLOOD (ROUTINE X 2): Special Requests: ADEQUATE

## 2023-07-17 LAB — GLUCOSE, CAPILLARY
Glucose-Capillary: 127 mg/dL — ABNORMAL HIGH (ref 70–99)
Glucose-Capillary: 108 mg/dL — ABNORMAL HIGH (ref 70–99)
Glucose-Capillary: 334 mg/dL — ABNORMAL HIGH (ref 70–99)
Glucose-Capillary: 205 mg/dL — ABNORMAL HIGH (ref 70–99)
Glucose-Capillary: 143 mg/dL — ABNORMAL HIGH (ref 70–99)
Glucose-Capillary: 149 mg/dL — ABNORMAL HIGH (ref 70–99)
Glucose-Capillary: 168 mg/dL — ABNORMAL HIGH (ref 70–99)
Glucose-Capillary: 186 mg/dL — ABNORMAL HIGH (ref 70–99)

## 2023-07-17 LAB — CBC
HCT: 23.4 % — ABNORMAL LOW (ref 39.0–52.0)
Hemoglobin: 8 g/dL — ABNORMAL LOW (ref 13.0–17.0)
MCH: 28.5 pg (ref 26.0–34.0)
MCHC: 34.2 g/dL (ref 30.0–36.0)
MCV: 83.3 fL (ref 80.0–100.0)
Platelets: 243 10*3/uL (ref 150–400)
RBC: 2.81 MIL/uL — ABNORMAL LOW (ref 4.22–5.81)
RDW: 17 % — ABNORMAL HIGH (ref 11.5–15.5)
WBC: 13.9 10*3/uL — ABNORMAL HIGH (ref 4.0–10.5)
nRBC: 0 % (ref 0.0–0.2)

## 2023-07-17 LAB — HEPARIN LEVEL (UNFRACTIONATED)
Heparin Unfractionated: 0.15 [IU]/mL — ABNORMAL LOW (ref 0.30–0.70)
Heparin Unfractionated: 0.35 [IU]/mL (ref 0.30–0.70)
Heparin Unfractionated: 0.16 [IU]/mL — ABNORMAL LOW (ref 0.30–0.70)

## 2023-07-17 MED ORDER — LACTATED RINGERS IV SOLN: INTRAVENOUS | Status: AC

## 2023-07-17 MED ORDER — HEPARIN BOLUS VIA INFUSION
1500.0000 [IU] | Freq: Once | INTRAVENOUS | Status: AC
  Administered 2023-07-17: 1500 [IU] via INTRAVENOUS
  Filled 2023-07-17: qty 1500

## 2023-07-17 MED ORDER — METOPROLOL SUCCINATE ER 25 MG PO TB24
50.0000 mg | ORAL_TABLET | Freq: Every day | ORAL | Status: DC
  Administered 2023-07-18: 50 mg via ORAL
  Filled 2023-07-17: qty 2

## 2023-07-17 MED ORDER — SODIUM CHLORIDE 0.9 % IV SOLN: INTRAVENOUS | Status: AC | PRN

## 2023-07-17 NOTE — Progress Notes (Signed)
 Patient Name: Nathan [name] Date of Encounter: 07/17/2023 [facility] Cardiologist: Oneil Parchment, MD   Interval Summary  .    Confused.  Reports that people were shooting at him from the windows outside.  Confusion was worse overnight but still present today.  Wife notes that he sometimes awakens confused but typically is oriented during the daytime at home.  She wonders if he will need physical therapy at home or rehab facility.  Reports right upper quadrant pain but no chest pain or pressure.  Denies shortness of breath.  Vital Signs .    Vitals:   07/17/23 0745 07/17/23 0800 07/17/23 0900 07/17/23 1000  BP: 107/69 118/65 125/87 122/79  Pulse: 80 87 92 (!) 105  Resp: 16 (!) 24 (!) 26 (!) 23  Temp:  (!) 97.5 F (36.4 C)    TempSrc:  Oral    SpO2: 94% 95% 96% 95%  Weight:      Height:        Intake/Output Summary (Last 24 hours) at 07/17/2023 1109 Last data filed at 07/17/2023 1000 Gross per 24 hour  Intake 1187.03 ml  Output --  Net 1187.03 ml      07/17/2023    4:08 AM 07/16/2023    5:00 AM 07/15/2023    2:05 AM  Last 3 Weights  Weight (lbs) 138 lb 7.2 oz 132 lb 15 oz 128 lb 1.4 oz  Weight (kg) 62.8 kg 60.3 kg 58.1 kg      Telemetry/ECG    Telemetry: Atrial fibrillation.  PVCs.  Rate less than 100 bpm.- Personally Reviewed  Echo 07/15/23:   1. Limited study.   2. Left ventricular ejection fraction, by estimation, is 35 to 40%. The  left ventricle has moderately decreased function. The left ventricle  demonstrates global hypokinesis, more prominent mid to distal anteroseptal  wall. The left ventricular internal  cavity size was mildly dilated.   3. No LV mural thrombus noted with Definity  contrast.   4. Right ventricular systolic function is normal. The right ventricular  size is normal.   5. Left atrial size was severely dilated.   6. The mitral valve is degenerative. Moderate mitral annular  calcification.   7. The aortic valve has been repaired/replaced.  Aortic valve  regurgitation is not visualized. There is a 23 mm Edwards bioprosthetic  valve present in the aortic position. Aortic valve mean gradient measures  7.0 mmHg.   Physical Exam .    VS:  BP 100/68   Pulse 92   Temp (!) 97.5 F (36.4 C) (Axillary)   Resp (!) 23   Ht 5' 7 (1.702 m)   Wt 62.8 kg   SpO2 92%   BMI 21.68 kg/m  , BMI Body mass index is 21.68 kg/m. GENERAL: Chronically ill-appearing.  Frail.   HEENT: Pupils equal round and reactive, fundi not visualized, oral mucosa unremarkable NECK: + jugular venous distention, waveform within normal limits, carotid upstroke brisk and symmetric, no bruits, no thyromegaly LUNGS:  Clear to auscultation bilaterally HEART:  RRR.  PMI not displaced or sustained,S1 and S2 within normal limits, no S3, no S4, no clicks, no rubs, no murmurs ABD:  Flat, positive bowel sounds normal in frequency in pitch, no bruits, no rebound, no guarding, no midline pulsatile mass, no hepatomegaly, no splenomegaly EXT:  2 plus pulses throughout, no edema, no cyanosis no clubbing SKIN:  No rashes no nodules NEURO:  Cranial nerves II through XII grossly intact, motor grossly intact throughout PSYCH:  Cannot use.  Oriented to person only.  Assessment & Plan .     # Demand ischemia:  # CAD s/p CABG:  # Hyperlipidemia:  Patient has a history of CABG. Now with elevated troponin to 2,264.  I suspect this is demand ischemia in the setting of sepsis.  Systolic function is unchanged from prior and he has no report of chest pain.  No ischemic changes on EKG.  He is not a good candidate for cardiac catheterization at this time.  Recommend continue IV heparin  x 48 hours and then discontinuing it.  Continue aggressive secondary prevention risk factor management.  Check fasting lipid panel.  Continue aspirin  and metoprolol .  Home statin is on hold in the setting of elevated liver enzymes.  Resume once clinically stable.  # HFrEF:  LVEF was 20-25% 02/2023 in the  setting of NSTEMI.  This improved to 35-40% after medical management. This admission LVEF 35-40% as well.  He appears to be euvolemic on exam.  Blood pressure is too low for titration of GDMT.  Given his systolic dysfunction he should be on metoprolol  succinate.  Will make that change.  # s/p AVR:  Stable.  Mean gradient 7 mmHg.   # Hypertension:  Home HCTZ minoxidil, spironolactone , and Imdur  are on hold due to hypotension..  Continue metoprolol .  # Permanent atrial fibrillation:  Not on anticoagulation 2/2 GI bleeding 2/2 duodenal ulcer perforation.  Continue metoprolol  as above.  Consider [facility] as an outpatient if underlying mental status issues resolved.  # OSA: Patient not on CPAP.   # Acute on chronic CKD: Worsening renal dysfunction.  Encourage hydration.  May be a component of ATN in the setting of hypotension from sepsis.    For questions or updates, please contact [facility] Please consult [email or web address] for contact info under        Signed, Annabella Scarce, MD

## 2023-07-17 NOTE — Progress Notes (Signed)
 PHARMACY - ANTICOAGULATION CONSULT NOTE  Pharmacy Consult for heparin  Indication: chest pain/ACS  Allergies  Allergen Reactions   Shellfish-Derived Products Anaphylaxis   Nsaids Other (See Comments)    H/o gastric ulcers = NO NSAIDs per GI   Zolpidem Tartrate Other (See Comments)    Hallucinations    Patient Measurements: Height: 5' 7 (170.2 cm) Weight: 62.8 kg (138 lb 7.2 oz) IBW/kg (Calculated) : 66.1 Heparin  Dosing Weight: 60 kg  Vital Signs: Temp: 97.5 F (36.4 C) (01/06 0000) Temp Source: Axillary (01/06 0000) BP: 116/77 (01/06 0100) Pulse Rate: 107 (01/06 0100)  Labs: Recent Labs    07/14/23 1942 07/14/23 2351 07/15/23 0257 07/15/23 1148 07/16/23 0307 07/16/23 0801 07/16/23 0959 07/16/23 1912 07/17/23 0259  HGB  --   --  9.3*  --  8.6*  --   --   --  8.0*  HCT  --   --  29.2*  --  25.6*  --   --   --  23.4*  PLT  --   --  284  --  235  --   --   --  243  LABPROT  --  18.5* 18.5*  --   --   --   --   --   --   INR  --  1.5* 1.5*  --   --   --   --   --   --   HEPARINUNFRC  --   --   --   --   --   --   --  0.39 0.15*  CREATININE  --   --  1.82* 2.25* 2.56* 2.98*  --   --   --   TROPONINIHS 16  --   --   --   --  2,264* 2,196*  --   --     Estimated Creatinine Clearance: 18.1 mL/min (A) (by C-G formula based on SCr of 2.98 mg/dL (H)).   Medical History: Past Medical History:  Diagnosis Date   Allergy    Aortic stenosis    Arthritis    Balance problem 04/26/2022   Blood transfusion without reported diagnosis    CAD (coronary artery disease)    Cataract    both eyes surgically removed   Diabetes mellitus, type 2 (HCC)    Diverticulosis of colon (without mention of hemorrhage)    Duodenal ulcer perforation (HCC)    blood transfusion   Flesh-eating bacteria (HCC) 1995   GERD (gastroesophageal reflux disease)    Glaucoma    pre glaucoma on Xalatan  drops   Heart murmur    History of diabetic retinopathy    History of gastrointestinal bleeding     History of necrotizing fasciitis    History of prosthetic aortic valve    Hypercholesteremia    Myocardial infarction (HCC) 02/23/2016   Neuropathic pain 04/26/2022   Nightmares    CHRONIC   OSA (obstructive sleep apnea)    borderline no CPAP    Personal history of colonic polyps 11/23/2009   TUBULAR ADENOMA   Postoperative anemia    Sinus bradycardia    Sleep apnea    no cpap   Stomach ulcer    Hx of   Systolic hypertension    Vitamin B12 deficiency      Assessment: 78 year old male presented with abdominal pain. History of cholecystitis required perc chole drain placement in August. He was found to have E.coli bacteremia this admission and is on antibiotics. He has a history  of atrial fibrillation but is not on anticoagulation given a history of bleeding. Patient with chest pain 1/5, now with significant elevation in troponin. Cardiology consulted, recommend heparin  infusion and cycling troponins. Some concern for NSTEMI in setting of physiologic stressors but not a good candidate for cardiac cath at this time. Pharmacy to manage heparin  infusion.  Hgb trending down some. Plt relatively stable.  07/17/2023: Heparin  level 0.15- now subtherapeutic on IV heparin  700 units/hr CBC: Hg 8- continues to trend down; pltc WNL/stable RN reports patient pulled out IV ~ 11p on 1/7 and heparin  was off ~30-40 minutes while IV restarted.  No bleeding noted except at site when IV was pulled out which has stopped.   Goal of Therapy:  Heparin  level 0.3-0.7 units/ml Monitor platelets by anticoagulation protocol: Yes   Plan:  -Heparin  1500 unit bolus  -Increase heparin  infusion to 800 units/hr-->slight increase since decreased level could still be from heparin  being off ~4h prior to lab draw -Check 8 hour heparin  level after rate change -Daily CBC while on infusion  Rosaline Millet, PharmD, BCPS Clinical Pharmacist 07/17/2023 4:09 AM

## 2023-07-17 NOTE — Progress Notes (Signed)
 Pharmacist Heart Failure Core Measure Documentation  Assessment: Nathan [name] has an EF documented as 35-40% on 07/15/23 by echo.  Rationale: Heart failure patients with left ventricular systolic dysfunction (LVSD) and an EF < 40% should be prescribed an angiotensin converting enzyme inhibitor (ACEI) or angiotensin receptor blocker (ARB) at discharge unless a contraindication is documented in the medical record.  This patient is not currently on an ACEI or ARB for HF.  This note is being placed in the record in order to provide documentation that a contraindication to the use of these agents is present for this encounter.  ACE Inhibitor or Angiotensin Receptor Blocker is contraindicated (specify all that apply)  []   ACEI allergy AND ARB allergy []   Angioedema []   Moderate or severe aortic stenosis []   Hyperkalemia []   Hypotension []   Renal artery stenosis [x]   Worsening renal function, preexisting renal disease or dysfunction   Nathan [name], Pharm.D Use secure chat for questions 07/17/2023 7:26 AM

## 2023-07-17 NOTE — Progress Notes (Signed)
 PHARMACY - ANTICOAGULATION CONSULT NOTE  Pharmacy Consult for heparin  Indication: chest pain/ACS  Allergies  Allergen Reactions   Shellfish-Derived Products Anaphylaxis   Nsaids Other (See Comments)    H/o gastric ulcers = NO NSAIDs per GI   Zolpidem Tartrate Other (See Comments)    Hallucinations    Patient Measurements: Height: 5' 7 (170.2 cm) Weight: 62.8 kg (138 lb 7.2 oz) IBW/kg (Calculated) : 66.1 Heparin  Dosing Weight: 60 kg  Vital Signs: Temp: 97.5 F (36.4 C) (01/06 1100) Temp Source: Axillary (01/06 1100) BP: 94/62 (01/06 1200) Pulse Rate: 86 (01/06 1200)  Labs: Recent Labs    07/14/23 1942 07/14/23 2351 07/15/23 0257 07/15/23 1148 07/16/23 0307 07/16/23 0801 07/16/23 0959 07/16/23 1912 07/17/23 0259 07/17/23 1355  HGB  --   --  9.3*  --  8.6*  --   --   --  8.0*  --   HCT  --   --  29.2*  --  25.6*  --   --   --  23.4*  --   PLT  --   --  284  --  235  --   --   --  243  --   LABPROT  --  18.5* 18.5*  --   --   --   --   --   --   --   INR  --  1.5* 1.5*  --   --   --   --   --   --   --   HEPARINUNFRC  --   --   --   --   --   --   --  0.39 0.15* 0.35  CREATININE  --   --  1.82*   < > 2.56* 2.98*  --   --  3.17*  --   TROPONINIHS 16  --   --   --   --  2,264* 2,196*  --   --   --    < > = values in this interval not displayed.    Estimated Creatinine Clearance: 17.1 mL/min (A) (by C-G formula based on SCr of 3.17 mg/dL (H)).   Assessment: 78 year old male presented with abdominal pain. History of cholecystitis required perc chole drain placement in August. He was found to have E.coli bacteremia this admission and is on antibiotics. He has a history of atrial fibrillation but is not on anticoagulation given a history of bleeding. Patient with chest pain 1/5, now with significant elevation in troponin. Cardiology consulted, recommend heparin  infusion and cycling troponins. Some concern for NSTEMI in setting of physiologic stressors but not a good  candidate for cardiac cath at this time. Pharmacy to manage heparin  infusion.  07/17/2023: Heparin  level drawn at 1355  = 0.35- therapeutic after 1500 unit heparin  bolus and rate increase to 800 units/hr CBC: Hg 8- continues to trend down; pltc WNL/stable No bleeding reported Per cards plan is 48 hrs of heparin  then stop. Heparin  was started 1/5 @ 11:15 am  Goal of Therapy:  Heparin  level 0.3-0.7 units/ml Monitor platelets by anticoagulation protocol: Yes   Plan:  continue heparin  infusion at 800 units/hr Check confirmatory heparin  level at 2200 Daily CBC & heparin  level Anticipate stopping heparin  1/7 am after 48 hours completed  Rosaline IVAR Edison, Pharm.D Use secure chat for questions 07/17/2023 2:44 PM

## 2023-07-17 NOTE — Progress Notes (Signed)
 Subjective/Chief Complaint: Events of night noted   Objective: Vital signs in last 24 hours: Temp:  [97.5 F (36.4 C)-98.1 F (36.7 C)] 97.5 F (36.4 C) (01/06 0800) Pulse Rate:  [71-133] 80 (01/06 0745) Resp:  [16-34] 16 (01/06 0745) BP: (63-141)/(47-101) 107/69 (01/06 0745) SpO2:  [90 %-100 %] 94 % (01/06 0745) Weight:  [62.8 kg] 62.8 kg (01/06 0408) Last BM Date :  (PTA)  Intake/Output from previous day: 01/05 0701 - 01/06 0700 In: 822.4 [I.V.:722.4; IV Piggyback:100] Out: -  Intake/Output this shift: No intake/output data recorded.  Exam: Sleeping but wakes easily Abdomen soft but there is some guarding/tenderness to deep palpation in the RUQ.  No frank peritonitis  Lab Results:  Recent Labs    07/16/23 0307 07/17/23 0259  WBC 18.3* 13.9*  HGB 8.6* 8.0*  HCT 25.6* 23.4*  PLT 235 243   BMET Recent Labs    07/16/23 0801 07/17/23 0259  NA 132* 132*  K 4.4 4.3  CL 100 100  CO2 15* 16*  GLUCOSE 112* 132*  BUN 64* 71*  CREATININE 2.98* 3.17*  CALCIUM  8.7* 8.7*   PT/INR Recent Labs    07/14/23 2351 07/15/23 0257  LABPROT 18.5* 18.5*  INR 1.5* 1.5*   ABG Recent Labs    07/16/23 0944  PHART 7.41  HCO3 19.6*    Studies/Results: ECHOCARDIOGRAM LIMITED Result Date: 07/15/2023    ECHOCARDIOGRAM LIMITED REPORT   Patient Name:   BRENNYN HAISLEY Date of Exam: 07/15/2023 Medical Rec #:  [id number]      Height:       67.0 in Accession #:    [id number]     Weight:       128.1 lb Date of Birth:  Nov 12, 1945     BSA:          1.673 m Patient Age:    78 years       BP:           102/65 mmHg Patient Gender: M              HR:           103 bpm. Exam Location:  Inpatient Procedure: Limited Echo, Color Doppler, Cardiac Doppler and Intracardiac            Opacification Agent Indications:    Congestive Heart Failure I50.9  History:        Patient has prior history of Echocardiogram examinations, most                 recent 03/06/2023. Aortic Valve Disease,  Signs/Symptoms:Murmur;                 Risk Factors:Hypertension and Sleep Apnea.                 Aortic Valve: 23 mm Edwards bioprosthetic valve is present in                 the aortic position.  Sonographer:    Jayson Gaskins Referring Phys: [id number] DORN NOVAK DEWALD IMPRESSIONS  1. Limited study.  2. Left ventricular ejection fraction, by estimation, is 35 to 40%. The left ventricle has moderately decreased function. The left ventricle demonstrates global hypokinesis, more prominent mid to distal anteroseptal wall. The left ventricular internal cavity size was mildly dilated.  3. No LV mural thrombus noted with Definity  contrast.  4. Right ventricular systolic function is normal. The right ventricular size is normal.  5. Left atrial size was severely  dilated.  6. The mitral valve is degenerative. Moderate mitral annular calcification.  7. The aortic valve has been repaired/replaced. Aortic valve regurgitation is not visualized. There is a 23 mm Edwards bioprosthetic valve present in the aortic position. Aortic valve mean gradient measures 7.0 mmHg. Comparison(s): Prior images reviewed side by side. LVEF stable in range of 35-40%. FINDINGS  Left Ventricle: Left ventricular ejection fraction, by estimation, is 35 to 40%. The left ventricle has moderately decreased function. The left ventricle demonstrates global hypokinesis. The left ventricular internal cavity size was mildly dilated. There is no concentric left ventricular hypertrophy. Right Ventricle: The right ventricular size is normal. No increase in right ventricular wall thickness. Right ventricular systolic function is normal. Left Atrium: Left atrial size was severely dilated. Mitral Valve: The mitral valve is degenerative in appearance. Moderate mitral annular calcification. Aortic Valve: The aortic valve has been repaired/replaced. Aortic valve regurgitation is not visualized. Aortic valve mean gradient measures 7.0 mmHg. Aortic valve peak gradient  measures 14.1 mmHg. Aortic valve area, by VTI measures 1.50 cm. There is a 23 mm Edwards bioprosthetic valve present in the aortic position. LEFT VENTRICLE PLAX 2D LVIDd:         5.70 cm LVIDs:         4.70 cm LV PW:         0.90 cm LV IVS:        0.80 cm LVOT diam:     1.80 cm LV SV:         45 LV SV Index:   27 LVOT Area:     2.54 cm  LEFT ATRIUM              Index LA Vol (A2C):   142.0 ml 84.86 ml/m LA Vol (A4C):   119.0 ml 71.11 ml/m LA Biplane Vol: 133.0 ml 79.48 ml/m  AORTIC VALVE AV Area (Vmax):    1.23 cm AV Area (Vmean):   1.34 cm AV Area (VTI):     1.50 cm AV Vmax:           188.00 cm/s AV Vmean:          129.000 cm/s AV VTI:            0.301 m AV Peak Grad:      14.1 mmHg AV Mean Grad:      7.0 mmHg LVOT Vmax:         90.70 cm/s LVOT Vmean:        68.100 cm/s LVOT VTI:          0.178 m LVOT/AV VTI ratio: 0.59  SHUNTS Systemic VTI:  0.18 m Systemic Diam: 1.80 cm Jayson Sierras MD Electronically signed by Jayson Sierras MD Signature Date/Time: 07/15/2023/5:30:13 PM    Final    NM Hepatobiliary Liver Func Result Date: 07/15/2023 CLINICAL DATA:  Cholecystitis. EXAM: NUCLEAR MEDICINE HEPATOBILIARY IMAGING TECHNIQUE: Sequential images of the abdomen were obtained out to 60 minutes following intravenous administration of radiopharmaceutical. RADIOPHARMACEUTICALS:  7.6 mCi Tc-30m  Choletec  IV COMPARISON:  03/02/2023 FINDINGS: Prompt uptake and biliary excretion of activity by the liver is seen. Biliary activity passes into small bowel, consistent with patent common bile duct. After the first hour of imaging there was no gallbladder activity visualized. Patient subsequently received 2 mg of morphine  intravenously. During the second hour of imaging gallbladder activity was visualized. IMPRESSION: 1. Delayed gallbladder filling is visualized during the second hour of imaging following the administration of morphine . Imaging findings are compatible with  chronic calculus cholecystitis. 2. Patent common bile  duct with biliary activity passing into the small bowel during the first hour of imaging. Electronically Signed   By: Waddell Calk M.D.   On: 07/15/2023 15:01    Anti-infectives: Anti-infectives (From admission, onward)    Start     Dose/Rate Route Frequency Ordered Stop   07/15/23 1300  cefTRIAXone  (ROCEPHIN ) 2 g in sodium chloride  0.9 % 100 mL IVPB        2 g 200 mL/hr over 30 Minutes Intravenous Every 24 hours 07/15/23 1122     07/15/23 1000  linezolid  (ZYVOX ) IVPB 600 mg  Status:  Discontinued        600 mg 300 mL/hr over 60 Minutes Intravenous Every 12 hours 07/15/23 0217 07/15/23 1124   07/15/23 0600  piperacillin -tazobactam (ZOSYN ) IVPB 3.375 g  Status:  Discontinued        3.375 g 12.5 mL/hr over 240 Minutes Intravenous Every 8 hours 07/14/23 2230 07/15/23 1122   07/14/23 2215  metroNIDAZOLE  (FLAGYL ) IVPB 500 mg        500 mg 100 mL/hr over 60 Minutes Intravenous  Once 07/14/23 2208 07/14/23 2336   07/14/23 1945  vancomycin  (VANCOCIN ) IVPB 1000 mg/200 mL premix        1,000 mg 200 mL/hr over 60 Minutes Intravenous STAT 07/14/23 1937 07/14/23 2153   07/14/23 1915  ceFEPIme  (MAXIPIME ) 2 g in sodium chloride  0.9 % 100 mL IVPB        2 g 200 mL/hr over 30 Minutes Intravenous  Once 07/14/23 1906 07/14/23 2013       Assessment/Plan: Patient with sepsis, multiple medical problems, prior percutaneous cholecystostomy tube for cholecystitis  -HIDA shows delayed filling of the gallbladder consistent with chronic cholecystitis but not acute cholecystitis.  He is not a surgical candidate given his overall medical condition and from a surgical standpoint would be a very high risk for conversion to an open procedure with bile duct injury, etc given CT findings.  His WBC is improving and his tenderness is minimal so will hold on having IR place another cholecystostomy tube for now  LOS: 2 days  Complex medical decision making  Vicenta Poli 07/17/2023

## 2023-07-17 NOTE — Plan of Care (Signed)
  Problem: Fluid Volume: Goal: Ability to maintain a balanced intake and output will improve Outcome: Not Progressing   Problem: Health Behavior/Discharge Planning: Goal: Ability to identify and utilize available resources and services will improve Outcome: Not Progressing Goal: Ability to manage health-related needs will improve Outcome: Not Progressing   Problem: Coping: Goal: Level of anxiety will decrease Outcome: Not Progressing

## 2023-07-17 NOTE — Progress Notes (Signed)
 PHARMACY - ANTICOAGULATION CONSULT NOTE  Pharmacy Consult for heparin  Indication: chest pain/ACS  Allergies  Allergen Reactions   Shellfish-Derived Products Anaphylaxis   Nsaids Other (See Comments)    H/o gastric ulcers = NO NSAIDs per GI   Zolpidem Tartrate Other (See Comments)    Hallucinations    Patient Measurements: Height: 5' 7 (170.2 cm) Weight: 62.8 kg (138 lb 7.2 oz) IBW/kg (Calculated) : 66.1 Heparin  Dosing Weight: actual body weight  Vital Signs: Temp: 97.5 F (36.4 C) (01/06 2009) Temp Source: Axillary (01/06 2009) BP: 124/81 (01/06 2300) Pulse Rate: 93 (01/06 2300)  Labs: Recent Labs     0000 07/14/23 2351 07/15/23 0257 07/15/23 1148 07/16/23 0307 07/16/23 0801 07/16/23 0959 07/16/23 1912 07/17/23 0259 07/17/23 1355 07/17/23 2207  HGB   < >  --  9.3*  --  8.6*  --   --   --  8.0*  --   --   HCT  --   --  29.2*  --  25.6*  --   --   --  23.4*  --   --   PLT  --   --  284  --  235  --   --   --  243  --   --   LABPROT  --  18.5* 18.5*  --   --   --   --   --   --   --   --   INR  --  1.5* 1.5*  --   --   --   --   --   --   --   --   HEPARINUNFRC  --   --   --   --   --   --   --    < > 0.15* 0.35 0.16*  CREATININE  --   --  1.82*   < > 2.56* 2.98*  --   --  3.17*  --   --   TROPONINIHS  --   --   --   --   --  2,264* 2,196*  --   --   --   --    < > = values in this interval not displayed.    Estimated Creatinine Clearance: 17.1 mL/min (A) (by C-G formula based on SCr of 3.17 mg/dL (H)).   Assessment: 78 year old male presented with abdominal pain. History of cholecystitis required perc chole drain placement in August. He was found to have E.coli bacteremia this admission and is on antibiotics. He has a history of atrial fibrillation but is not on anticoagulation given a history of bleeding. Patient with chest pain 1/5, now with significant elevation in troponin. Cardiology consulted, recommend heparin  infusion and cycling troponins. Some concern  for NSTEMI in setting of physiologic stressors but not a good candidate for cardiac cath at this time. Pharmacy to manage heparin  infusion.  07/17/2023: Heparin  level drawn at 22:07  = 0.16- now subtherapeutic after HL earlier today therapeutic on same rate  RN does report that patient has been bending his IV lines and she has had to straighten them a couple times during her shift so far; otherwise no other line issues nor pause in heparin  therapy CBC: Hg 8- continues to trend down; pltc WNL/stable No bleeding reported by RN Per cards plan is 48 hrs of heparin  then stop. Heparin  was started 1/5 @ 11:15 am  Goal of Therapy:  Heparin  level 0.3-0.7 units/ml Monitor platelets by anticoagulation protocol: Yes  Plan:  Heparin  1500 unit IV bolus x 1 Increase heparin  infusion to 900 units/hr  Check heparin  level 8 hr after rate increase Daily CBC & heparin  level Anticipate stopping heparin  1/7 am after 48 hours completed  Laquanta Hummel, PharmD 07/17/2023 11:33 PM

## 2023-07-17 NOTE — Progress Notes (Signed)
 [facility]  PROGRESS NOTE  Vollie Brunty FMW:[id number] DOB: 1946-02-21 DOA: 07/14/2023 PCP: Yolande Toribio MATSU, MD   Brief HPI:   Present Illness:  Patient is a 78 year old male with pertinent PMH previous cholecystitis (had perc choley drain placed 03/02/2023), DMT2, HTN, HLD, PAF not on AC due previous bleeding, systolic CHF, CAD s/p CABG and AVR, CKD 3a, OSA presents to [facility] ED on 1/3 with abdominal pain/sepsis. Patient last admitted 02/2023 with chest pain. Initially concern of MI with some acute systolic CHF. Patient also with abdominal pain and imaging showing cholecystitis. Per choley drain placed. Discharged on IV abx. Over the past 3 days patient having generalized weakness and confusion. On 1/3 went to [facility] urgent care and BP 68/42. Was recommended to come to [facility] ED for further eval. Initial BP 109/72. Sats stable on room air. Afebrile. Patient having some abdominal pain.   CT ABD/pelvis showing inflammatory changes of gallbladder consistent with acute cholecystitis.   Assessment/Plan:   Septic shock secondary to E.coli bacteremia in setting of chronic calculus cholecystitis - shock resolved -Hida scan neg for obstruction, delayed filling during 2nd hour -Pressors were weaned off -Continue ceftriaxone  -Blood cultures from 07/14/2023 grew E. Coli -Repeat blood cultures from 07/15/2023 as well as 1/625 have been negative so far -General Surgery following, plan for placing another cholecystostomy tube -He is not a candidate for surgical option   Acute on Chronic Systolic Heart Failure (EF 35-40%) Atrial Fibrillation CAD, aortic stenosis s/p CABG x 3 with AVR on 10/2008  Echo unchanged -Cardiology following, recommend IV heparin  for 48 hours -Troponin elevation due to demand ischemia -No goal-directed medical therapy due to hypotension -Started on metoprolol  succinate    AKI with metabolic acidosis- worsening -Likely in setting of hypotension -If no improvement  consider nephrology consultation in a.m.  Lactic Acidosis - resolved  Elevated Liver enzymes - improving - monitor   Melanoma  - has appointment at [facility] on 1/8 to see specialist   DMII -Continue sliding scale insulin  with NovoLog  -CBG well-controlled    Medications     aspirin  EC  81 mg Oral Daily   Chlorhexidine  Gluconate Cloth  6 each Topical Daily   insulin  aspart  0-9 Units Subcutaneous Q4H   metoprolol  tartrate  25 mg Oral BID   polycarbophil  625 mg Oral BID     Data Reviewed:   CBG:  Recent Labs  Lab 07/16/23 1534 07/16/23 1941 07/16/23 2325 07/17/23 0258 07/17/23 0732  GLUCAP 158* 150* 146* 127* 108*    SpO2: 94 % O2 Flow Rate (L/min): 2 L/min    Vitals:   07/17/23 0500 07/17/23 0600 07/17/23 0745 07/17/23 0800  BP: 91/62 110/88 107/69   Pulse: 91 90 80   Resp: (!) 22 (!) 27 16   Temp:    (!) 97.5 F (36.4 C)  TempSrc:    Oral  SpO2: 97% 95% 94%   Weight:      Height:          Data Reviewed:  Basic Metabolic Panel: Recent Labs  Lab 07/15/23 0257 07/15/23 0259 07/15/23 1148 07/16/23 0307 07/16/23 0801 07/17/23 0259  NA 130*  --  131* 131* 132* 132*  K 4.6 4.8 4.4 4.5 4.4 4.3  CL 99  --  99 99 100 100  CO2 15*  --  16* 19* 15* 16*  GLUCOSE 157*  --  166* 106* 112* 132*  BUN 50*  --  54* 61* 64* 71*  CREATININE 1.82*  --  2.25* 2.56* 2.98* 3.17*  CALCIUM  8.9  --  9.0 8.8* 8.7* 8.7*  MG 1.4* 1.4*  --   --  2.4  --   PHOS 3.7 3.6  --   --   --   --     CBC: Recent Labs  Lab 07/14/23 1847 07/15/23 0257 07/16/23 0307 07/17/23 0259  WBC 24.3* 27.0* 18.3* 13.9*  NEUTROABS 21.7*  --   --   --   HGB 10.1* 9.3* 8.6* 8.0*  HCT 29.2* 29.2* 25.6* 23.4*  MCV 86.4 89.6 85.6 83.3  PLT 263 284 235 243    LFT Recent Labs  Lab 07/14/23 1847 07/15/23 0257 07/16/23 0307 07/17/23 0259  AST 221* 151* 97* 64*  ALT 192* 153* 109* 87*  ALKPHOS 178* 149* 131* 142*  BILITOT 2.5* 3.5* 1.5* 1.4*  PROT 6.7 6.1* 5.5* 5.5*  ALBUMIN 3.2*  2.9* 2.5* 2.3*     Antibiotics: Anti-infectives (From admission, onward)    Start     Dose/Rate Route Frequency Ordered Stop   07/15/23 1300  cefTRIAXone  (ROCEPHIN ) 2 g in sodium chloride  0.9 % 100 mL IVPB        2 g 200 mL/hr over 30 Minutes Intravenous Every 24 hours 07/15/23 1122     07/15/23 1000  linezolid  (ZYVOX ) IVPB 600 mg  Status:  Discontinued        600 mg 300 mL/hr over 60 Minutes Intravenous Every 12 hours 07/15/23 0217 07/15/23 1124   07/15/23 0600  piperacillin -tazobactam (ZOSYN ) IVPB 3.375 g  Status:  Discontinued        3.375 g 12.5 mL/hr over 240 Minutes Intravenous Every 8 hours 07/14/23 2230 07/15/23 1122   07/14/23 2215  metroNIDAZOLE  (FLAGYL ) IVPB 500 mg        500 mg 100 mL/hr over 60 Minutes Intravenous  Once 07/14/23 2208 07/14/23 2336   07/14/23 1945  vancomycin  (VANCOCIN ) IVPB 1000 mg/200 mL premix        1,000 mg 200 mL/hr over 60 Minutes Intravenous STAT 07/14/23 1937 07/14/23 2153   07/14/23 1915  ceFEPIme  (MAXIPIME ) 2 g in sodium chloride  0.9 % 100 mL IVPB        2 g 200 mL/hr over 30 Minutes Intravenous  Once 07/14/23 1906 07/14/23 2013        DVT prophylaxis: Patient on heparin   Code Status: DNR  Family Communication: No family at bedside   CONSULTS General Surgery, cardiology   Subjective   Patient was confused last night, required Precedex .  Blood pressure was soft, Precedex  was discontinued.  He is much more alert, communicating.  Denies pain.   Objective    Physical Examination:   General: Appears in no acute distress Cardiovascular: S1-S2, regular Respiratory: Lungs clear to auscultation bilaterally Abdomen: Soft, nontender, no organomegaly Extremities: No edema in the lower extremities Neurologic: Alert, oriented to self and place only, no focal deficit noted   Status is: Inpatient:             Sabas GORMAN Brod   [facility] If 7PM-7AM, please contact night-coverage at [email or web address], Office   [phone]   07/17/2023, 8:22 AM  LOS: 2 days

## 2023-07-18 DIAGNOSIS — N179 Acute kidney failure, unspecified: Secondary | ICD-10-CM | POA: Diagnosis not present

## 2023-07-18 DIAGNOSIS — I5023 Acute on chronic systolic (congestive) heart failure: Secondary | ICD-10-CM | POA: Diagnosis not present

## 2023-07-18 DIAGNOSIS — Z953 Presence of xenogenic heart valve: Secondary | ICD-10-CM

## 2023-07-18 DIAGNOSIS — F411 Generalized anxiety disorder: Secondary | ICD-10-CM

## 2023-07-18 DIAGNOSIS — A4151 Sepsis due to Escherichia coli [E. coli]: Secondary | ICD-10-CM | POA: Diagnosis not present

## 2023-07-18 DIAGNOSIS — I4891 Unspecified atrial fibrillation: Secondary | ICD-10-CM | POA: Diagnosis not present

## 2023-07-18 LAB — LIPID PANEL
Cholesterol: 97 mg/dL (ref 0–200)
HDL: <10 mg/dL — ABNORMAL LOW (ref >40)
Triglycerides: 203 mg/dL — ABNORMAL HIGH (ref <150)
VLDL: 41 mg/dL — ABNORMAL HIGH (ref 0–40)

## 2023-07-18 LAB — CBC
HCT: 28.2 % — ABNORMAL LOW (ref 39.0–52.0)
Hemoglobin: 9.6 g/dL — ABNORMAL LOW (ref 13.0–17.0)
MCH: 28.7 pg (ref 26.0–34.0)
MCHC: 34 g/dL (ref 30.0–36.0)
MCV: 84.4 fL (ref 80.0–100.0)
Platelets: 286 10*3/uL (ref 150–400)
RBC: 3.34 MIL/uL — ABNORMAL LOW (ref 4.22–5.81)
RDW: 17.6 % — ABNORMAL HIGH (ref 11.5–15.5)
WBC: 13.6 10*3/uL — ABNORMAL HIGH (ref 4.0–10.5)
nRBC: 0.3 % — ABNORMAL HIGH (ref 0.0–0.2)

## 2023-07-18 LAB — GLUCOSE, CAPILLARY
Glucose-Capillary: 121 mg/dL — ABNORMAL HIGH (ref 70–99)
Glucose-Capillary: 98 mg/dL (ref 70–99)
Glucose-Capillary: 127 mg/dL — ABNORMAL HIGH (ref 70–99)
Glucose-Capillary: 141 mg/dL — ABNORMAL HIGH (ref 70–99)
Glucose-Capillary: 166 mg/dL — ABNORMAL HIGH (ref 70–99)

## 2023-07-18 LAB — COMPREHENSIVE METABOLIC PANEL
ALT: 394 U/L — ABNORMAL HIGH (ref 0–44)
AST: 772 U/L — ABNORMAL HIGH (ref 15–41)
Albumin: 2.8 g/dL — ABNORMAL LOW (ref 3.5–5.0)
Alkaline Phosphatase: 166 U/L — ABNORMAL HIGH (ref 38–126)
Anion gap: 18 — ABNORMAL HIGH (ref 5–15)
BUN: 79 mg/dL — ABNORMAL HIGH (ref 8–23)
CO2: 15 mmol/L — ABNORMAL LOW (ref 22–32)
Calcium: 9 mg/dL (ref 8.9–10.3)
Chloride: 100 mmol/L (ref 98–111)
Creatinine, Ser: 3.39 mg/dL — ABNORMAL HIGH (ref 0.61–1.24)
GFR, Estimated: 18 mL/min — ABNORMAL LOW (ref >60)
Glucose, Bld: 135 mg/dL — ABNORMAL HIGH (ref 70–99)
Potassium: 4.8 mmol/L (ref 3.5–5.1)
Sodium: 133 mmol/L — ABNORMAL LOW (ref 135–145)
Total Bilirubin: 1.4 mg/dL — ABNORMAL HIGH (ref 0.0–1.2)
Total Protein: 6.6 g/dL (ref 6.5–8.1)

## 2023-07-18 LAB — HEPARIN LEVEL (UNFRACTIONATED): Heparin Unfractionated: 0.17 [IU]/mL — ABNORMAL LOW (ref 0.30–0.70)

## 2023-07-18 MED ORDER — METOPROLOL SUCCINATE ER 50 MG PO TB24
100.0000 mg | ORAL_TABLET | Freq: Every day | ORAL | Status: DC
  Administered 2023-07-19 – 2023-07-26 (×7): 100 mg via ORAL
  Filled 2023-07-18 (×4): qty 2
  Filled 2023-07-18 (×4): qty 4

## 2023-07-18 MED ORDER — HEPARIN SODIUM (PORCINE) 5000 UNIT/ML IJ SOLN
5000.0000 [IU] | Freq: Three times a day (TID) | INTRAMUSCULAR | Status: DC
  Administered 2023-07-18 – 2023-07-26 (×24): 5000 [IU] via SUBCUTANEOUS
  Filled 2023-07-18 (×24): qty 1

## 2023-07-18 MED ORDER — CEFAZOLIN SODIUM-DEXTROSE 1-4 GM/50ML-% IV SOLN
1.0000 g | Freq: Two times a day (BID) | INTRAVENOUS | Status: DC
Start: 2023-07-18 — End: 2023-07-20
  Administered 2023-07-18 – 2023-07-19 (×3): 1 g via INTRAVENOUS
  Filled 2023-07-18 (×5): qty 50

## 2023-07-18 MED ORDER — ORAL CARE MOUTH RINSE: 15.0000 mL | OROMUCOSAL | Status: DC | PRN

## 2023-07-18 MED ORDER — CEFAZOLIN SODIUM-DEXTROSE 2-4 GM/100ML-% IV SOLN
2.0000 g | Freq: Once | INTRAVENOUS | Status: AC
Start: 2023-07-18 — End: 2023-07-18
  Administered 2023-07-18: 2 g via INTRAVENOUS
  Filled 2023-07-18: qty 100

## 2023-07-18 NOTE — Progress Notes (Signed)
 Subjective/Chief Complaint: Denies abdominal pain this morning   Objective: Vital signs in last 24 hours: Temp:  [97.4 F (36.3 C)-97.7 F (36.5 C)] 97.7 F (36.5 C) (01/07 0400) Pulse Rate:  [72-105] 83 (01/07 0700) Resp:  [18-33] 29 (01/07 0700) BP: (88-138)/(56-98) 138/98 (01/07 0600) SpO2:  [91 %-100 %] 95 % (01/07 0700) Weight:  [65.4 kg] 65.4 kg (01/07 0351) Last BM Date :  (PTA)  Intake/Output from previous day: 01/06 0701 - 01/07 0700 In: 2444.7 [I.V.:2344.7; IV Piggyback:100] Out: 650 [Urine:650] Intake/Output this shift: No intake/output data recorded.  Exam: Awake and alert Abdomen soft, non-distended, no palpable mass in the RUQ, almost non-tender and no guarding  Lab Results:  Recent Labs    07/17/23 0259 07/18/23 0258  WBC 13.9* 13.6*  HGB 8.0* 9.6*  HCT 23.4* 28.2*  PLT 243 286   BMET Recent Labs    07/17/23 0259 07/18/23 0258  NA 132* 133*  K 4.3 4.8  CL 100 100  CO2 16* 15*  GLUCOSE 132* 135*  BUN 71* 79*  CREATININE 3.17* 3.39*  CALCIUM  8.7* 9.0   PT/INR No results for input(s): LABPROT, INR in the last 72 hours. ABG Recent Labs    07/16/23 0944  PHART 7.41  HCO3 19.6*    Studies/Results: No results found.  Anti-infectives: Anti-infectives (From admission, onward)    Start     Dose/Rate Route Frequency Ordered Stop   07/15/23 1300  cefTRIAXone  (ROCEPHIN ) 2 g in sodium chloride  0.9 % 100 mL IVPB  Status:  Discontinued        2 g 200 mL/hr over 30 Minutes Intravenous Every 24 hours 07/15/23 1122 07/18/23 0747   07/15/23 1000  linezolid  (ZYVOX ) IVPB 600 mg  Status:  Discontinued        600 mg 300 mL/hr over 60 Minutes Intravenous Every 12 hours 07/15/23 0217 07/15/23 1124   07/15/23 0600  piperacillin -tazobactam (ZOSYN ) IVPB 3.375 g  Status:  Discontinued        3.375 g 12.5 mL/hr over 240 Minutes Intravenous Every 8 hours 07/14/23 2230 07/15/23 1122   07/14/23 2215  metroNIDAZOLE  (FLAGYL ) IVPB 500 mg        500  mg 100 mL/hr over 60 Minutes Intravenous  Once 07/14/23 2208 07/14/23 2336   07/14/23 1945  vancomycin  (VANCOCIN ) IVPB 1000 mg/200 mL premix        1,000 mg 200 mL/hr over 60 Minutes Intravenous STAT 07/14/23 1937 07/14/23 2153   07/14/23 1915  ceFEPIme  (MAXIPIME ) 2 g in sodium chloride  0.9 % 100 mL IVPB        2 g 200 mL/hr over 30 Minutes Intravenous  Once 07/14/23 1906 07/14/23 2013       Assessment/Plan: Patient with sepsis, multiple medical problems, prior percutaneous cholecystostomy tube for cholecystitis. HIDA scan shows delayed filling of the gallbladder consistent with chronic cholecystitis  -LFT's increased today as well as his creatinine.  WBC stable at 13  -situation remains difficult.  He is not a surgical candidate for cholecystectomy.  It is uncertain whether or not the chronic cholecystitis is causing his over all worsening labs or just his overall medical condition.  From our standpoint, the only thing we could recommend is an IR consult to see if they would place another cholecystostomy tube which they may not think is indicated given the HIDA scan. And, I am uncertain whether a gallbladder drain would improve his condition  I discussed this with the patient and his wife at the  bedside.  She is concerned about him having another drain and the risk he could pull it out given his dementia issues. They are going to think about it and make a decision regarding an Interventional Radiology consult  Complex medical decision making      Vicenta Poli 07/18/2023

## 2023-07-18 NOTE — Progress Notes (Signed)
 [facility]  PROGRESS NOTE  Nathan [name] FMW:[id number] DOB: 03-Mar-1946 DOA: 07/14/2023 PCP: Yolande Toribio MATSU, MD   Brief HPI:   Present Illness:  Patient is a 78 year old male with pertinent PMH previous cholecystitis (had perc choley drain placed 03/02/2023), DMT2, HTN, HLD, PAF not on AC due previous bleeding, systolic CHF, CAD s/p CABG and AVR, CKD 3a, OSA presents to [facility] ED on 1/3 with abdominal pain/sepsis. Patient last admitted 02/2023 with chest pain. Initially concern of MI with some acute systolic CHF. Patient also with abdominal pain and imaging showing cholecystitis. Per choley drain placed. Discharged on IV abx. Over the past 3 days patient having generalized weakness and confusion. On 1/3 went to [facility] urgent care and BP 68/42. Was recommended to come to [facility] ED for further eval. Initial BP 109/72. Sats stable on room air. Afebrile. Patient having some abdominal pain.   CT ABD/pelvis showing inflammatory changes of gallbladder consistent with acute cholecystitis.   Assessment/Plan:   Septic shock secondary to E.coli bacteremia in setting of chronic calculus cholecystitis - shock physiology has resolved -Hida scan neg for obstruction, delayed filling during 2nd hour -Pressors has been  weaned off -Started on ceftriaxone , changed to cefazolin  as E. coli from blood culture is pansensitive -Blood cultures from 07/14/2023 grew E. Coli -Repeat blood cultures from 07/15/2023 as well as 1/625 have been negative so far -General Surgery following, plan for placing another cholecystostomy tube -He is not a candidate for surgical option   Acute on Chronic Systolic Heart Failure (EF 35-40%) Atrial Fibrillation CAD, aortic stenosis s/p CABG x 3 with AVR on 10/2008  Echo unchanged -Cardiology following, recommend IV heparin  for 48 hours -Troponin elevation due to demand ischemia -No goal-directed medical therapy due to hypotension -Started on metoprolol  succinate    AKI with  metabolic acidosis- worsening -Likely in setting of hypotension -Today creatinine is worse at 3.39 -Consult nephrology for further management  Lactic Acidosis - resolved  Elevated Liver enzymes -  -LFTs are worse likely in setting of hypotension -Follow LFTs in a.m.   Melanoma  - has appointment at [facility] on 1/8 to see specialist   DMII -Continue sliding scale insulin  with NovoLog  -CBG well-controlled    Medications     aspirin  EC  81 mg Oral Daily   Chlorhexidine  Gluconate Cloth  6 each Topical Daily   insulin  aspart  0-9 Units Subcutaneous Q4H   metoprolol  succinate  50 mg Oral Daily   polycarbophil  625 mg Oral BID     Data Reviewed:   CBG:  Recent Labs  Lab 07/17/23 1717 07/17/23 2000 07/17/23 2315 07/18/23 0330 07/18/23 0731  GLUCAP 149* 168* 186* 121* 98    SpO2: 95 % O2 Flow Rate (L/min): 1.5 L/min    Vitals:   07/18/23 0400 07/18/23 0500 07/18/23 0600 07/18/23 0700  BP: 117/71 129/84 (!) 138/98   Pulse: 93 87 86 83  Resp: (!) 26 (!) 30 (!) 26 (!) 29  Temp: 97.7 F (36.5 C)     TempSrc: Axillary     SpO2: 93% 100% 95% 95%  Weight:      Height:          Data Reviewed:  Basic Metabolic Panel: Recent Labs  Lab 07/15/23 0257 07/15/23 0259 07/15/23 1148 07/16/23 0307 07/16/23 0801 07/17/23 0259 07/18/23 0258  NA 130*  --  131* 131* 132* 132* 133*  K 4.6 4.8 4.4 4.5 4.4 4.3 4.8  CL 99  --  99 99 100  100 100  CO2 15*  --  16* 19* 15* 16* 15*  GLUCOSE 157*  --  166* 106* 112* 132* 135*  BUN 50*  --  54* 61* 64* 71* 79*  CREATININE 1.82*  --  2.25* 2.56* 2.98* 3.17* 3.39*  CALCIUM  8.9  --  9.0 8.8* 8.7* 8.7* 9.0  MG 1.4* 1.4*  --   --  2.4  --   --   PHOS 3.7 3.6  --   --   --   --   --     CBC: Recent Labs  Lab 07/14/23 1847 07/15/23 0257 07/16/23 0307 07/17/23 0259 07/18/23 0258  WBC 24.3* 27.0* 18.3* 13.9* 13.6*  NEUTROABS 21.7*  --   --   --   --   HGB 10.1* 9.3* 8.6* 8.0* 9.6*  HCT 29.2* 29.2* 25.6* 23.4* 28.2*  MCV  86.4 89.6 85.6 83.3 84.4  PLT 263 284 235 243 286    LFT Recent Labs  Lab 07/14/23 1847 07/15/23 0257 07/16/23 0307 07/17/23 0259 07/18/23 0258  AST 221* 151* 97* 64* 772*  ALT 192* 153* 109* 87* 394*  ALKPHOS 178* 149* 131* 142* 166*  BILITOT 2.5* 3.5* 1.5* 1.4* 1.4*  PROT 6.7 6.1* 5.5* 5.5* 6.6  ALBUMIN 3.2* 2.9* 2.5* 2.3* 2.8*     Antibiotics: Anti-infectives (From admission, onward)    Start     Dose/Rate Route Frequency Ordered Stop   07/15/23 1300  cefTRIAXone  (ROCEPHIN ) 2 g in sodium chloride  0.9 % 100 mL IVPB  Status:  Discontinued        2 g 200 mL/hr over 30 Minutes Intravenous Every 24 hours 07/15/23 1122 07/18/23 0747   07/15/23 1000  linezolid  (ZYVOX ) IVPB 600 mg  Status:  Discontinued        600 mg 300 mL/hr over 60 Minutes Intravenous Every 12 hours 07/15/23 0217 07/15/23 1124   07/15/23 0600  piperacillin -tazobactam (ZOSYN ) IVPB 3.375 g  Status:  Discontinued        3.375 g 12.5 mL/hr over 240 Minutes Intravenous Every 8 hours 07/14/23 2230 07/15/23 1122   07/14/23 2215  metroNIDAZOLE  (FLAGYL ) IVPB 500 mg        500 mg 100 mL/hr over 60 Minutes Intravenous  Once 07/14/23 2208 07/14/23 2336   07/14/23 1945  vancomycin  (VANCOCIN ) IVPB 1000 mg/200 mL premix        1,000 mg 200 mL/hr over 60 Minutes Intravenous STAT 07/14/23 1937 07/14/23 2153   07/14/23 1915  ceFEPIme  (MAXIPIME ) 2 g in sodium chloride  0.9 % 100 mL IVPB        2 g 200 mL/hr over 30 Minutes Intravenous  Once 07/14/23 1906 07/14/23 2013        DVT prophylaxis: Patient on heparin   Code Status: DNR  Family Communication: Wife at bedside   CONSULTS General Surgery, cardiology   Subjective   Patient seen and examined, he is more alert today.  Communicating well.  Blood pressure is stable.  Denies pain   Objective    Physical Examination:   General-appears in no acute distress Heart-S1-S2, regular, no murmur auscultated Lungs-clear to auscultation bilaterally, no wheezing or  crackles auscultated Abdomen-soft, nontender, no organomegaly Extremities-no edema in the lower extremities Neuro-alert, oriented x3, no focal deficit noted   Status is: Inpatient:             Nathan [name]   [facility] If 7PM-7AM, please contact night-coverage at [email or web address], Office  [phone]   07/18/2023, 7:47 AM  LOS: 3 days

## 2023-07-18 NOTE — Progress Notes (Signed)
 Patient Name: Nathan [name] Date of Encounter: 07/18/2023 [facility] Cardiologist: Oneil Parchment, MD   Interval Summary  .    Confused.  Asking to speak to his sister urgently.   Vital Signs .    Vitals:   07/18/23 0805 07/18/23 0900 07/18/23 0952 07/18/23 1119  BP:  (!) 148/96 (!) 142/63   Pulse: 83 92 85   Resp: (!) 21 (!) 32    Temp:    97.6 F (36.4 C)  TempSrc:    Axillary  SpO2: 96% 95%    Weight:      Height:        Intake/Output Summary (Last 24 hours) at 07/18/2023 1158 Last data filed at 07/18/2023 1128 Gross per 24 hour  Intake 2330.36 ml  Output 650 ml  Net 1680.36 ml      07/18/2023    3:51 AM 07/17/2023    4:08 AM 07/16/2023    5:00 AM  Last 3 Weights  Weight (lbs) 144 lb 2.9 oz 138 lb 7.2 oz 132 lb 15 oz  Weight (kg) 65.4 kg 62.8 kg 60.3 kg      Telemetry/ECG    Telemetry: Atrial fibrillation.  PVCs.  3 beats NSVT. Rate less than 100 bpm.- Personally Reviewed  Echo 07/15/23:   1. Limited study.   2. Left ventricular ejection fraction, by estimation, is 35 to 40%. The  left ventricle has moderately decreased function. The left ventricle  demonstrates global hypokinesis, more prominent mid to distal anteroseptal  wall. The left ventricular internal  cavity size was mildly dilated.   3. No LV mural thrombus noted with Definity  contrast.   4. Right ventricular systolic function is normal. The right ventricular  size is normal.   5. Left atrial size was severely dilated.   6. The mitral valve is degenerative. Moderate mitral annular  calcification.   7. The aortic valve has been repaired/replaced. Aortic valve  regurgitation is not visualized. There is a 23 mm Edwards bioprosthetic  valve present in the aortic position. Aortic valve mean gradient measures  7.0 mmHg.   Physical Exam .    VS:  BP (!) 142/63   Pulse 85   Temp 97.6 F (36.4 C) (Axillary)   Resp (!) 32   Ht 5' 7 (1.702 m)   Wt 65.4 kg   SpO2 95%   BMI 22.58 kg/m  , BMI  Body mass index is 22.58 kg/m. GENERAL: Chronically ill-appearing.  Frail.   HEENT: Pupils equal round and reactive, fundi not visualized, oral mucosa unremarkable NECK: no jugular venous distention, waveform within normal limits, carotid upstroke brisk and symmetric, no bruits, no thyromegaly LUNGS:  Clear to auscultation bilaterally HEART:  RRR.  PMI not displaced or sustained,S1 and S2 within normal limits, no S3, no S4, no clicks, no rubs, no murmurs ABD:  Flat, positive bowel sounds normal in frequency in pitch, no bruits, no rebound, no guarding, no midline pulsatile mass, no hepatomegaly, no splenomegaly EXT:  2 plus pulses throughout, no edema, no cyanosis no clubbing SKIN:  No rashes no nodules NEURO:  Cranial nerves II through XII grossly intact, motor grossly intact throughout PSYCH: Cannot use.  Oriented to person only.  Assessment & Plan .     # Demand ischemia:  # CAD s/p CABG:  # Hyperlipidemia:  Patient has a history of CABG. Now with elevated troponin to 2,264.  I suspect this is demand ischemia in the setting of sepsis.  Systolic function is unchanged  from prior and he has no report of chest pain.  No ischemic changes on EKG. he remains quite confused.  He is not a good candidate for cardiac catheterization at this time.  He has been transition from 48 hours of IV heparin  to subcutaneous heparin .  Continue aggressive secondary prevention risk factor management.  LDL cholesterol is less than 55.  Continue aspirin  and metoprolol .  Home statin is on hold in the setting of elevated liver enzymes.  Resume once clinically stable.    # HFrEF:  LVEF was 20-25% 02/2023 in the setting of NSTEMI.  This improved to 35-40% after medical management. This admission LVEF 35-40% as well.  He appears to be euvolemic on exam.  If anything he has been dry.  Titration of GDMT has been limited by hypotension and now acute on chronic renal failure.  Continue metoprolol  succinate.  Will increase the  dose to 100 mg daily.  If additional blood pressure control is needed can add hydralazine  and nitrates.  # s/p AVR:  Stable.  Mean gradient 7 mmHg.   # Hypertension:  Home HCTZ minoxidil, spironolactone , and Imdur  are on hold. Continue metoprolol  as above.  # Permanent atrial fibrillation:  Not on anticoagulation 2/2 GI bleeding 2/2 duodenal ulcer perforation.  Continue metoprolol  as above.  Consider Watchman as an outpatient if underlying mental status issues resolved.  # OSA: Patient not on CPAP.   # Acute on chronic CKD: Worsening renal dysfunction.  IV fluids were discontinued this morning.  May be a component of ATN in the setting of hypotension from sepsis.    For questions or updates, please contact [facility] Please consult [email or web address] for contact info under        Signed, Annabella Scarce, MD

## 2023-07-18 NOTE — Progress Notes (Signed)
 Pharmacy Antibiotic Note  Nathan [name] is a 78 y.o. male admitted on 07/14/2023 with E.coli bacteremia related to likely intra-abdominal source in the setting of chronic cholecystitis.  Pharmacy has been consulted for Cefazolin  dosing.  The patient is noted to have worsening AKI with SCr up to 3.39 (BL 1.5-2), still with charted UOP. Noted to still have some trends towards hypotension however not requiring pressors.  Plan: - Cefazolin  2g IV x 1 followed by 1g IV every 12 hours - Will continue to follow renal function, culture results, LOT, and antibiotic de-escalation plans   Height: 5' 7 (170.2 cm) Weight: 65.4 kg (144 lb 2.9 oz) IBW/kg (Calculated) : 66.1  Temp (24hrs), Avg:97.5 F (36.4 C), Min:97.4 F (36.3 C), Max:97.7 F (36.5 C)  Recent Labs  Lab 07/14/23 1847 07/14/23 1914 07/14/23 2005 07/15/23 0257 07/15/23 0452 07/15/23 1148 07/16/23 0307 07/16/23 0801 07/17/23 0259 07/18/23 0258  WBC 24.3*  --   --  27.0*  --   --  18.3*  --  13.9* 13.6*  CREATININE 1.96*  --   --  1.82*  --  2.25* 2.56* 2.98* 3.17* 3.39*  LATICACIDVEN  --  5.7* 5.6*  --  3.7* 4.4*  --  1.6  --   --     Estimated Creatinine Clearance: 16.6 mL/min (A) (by C-G formula based on SCr of 3.39 mg/dL (H)).    Allergies  Allergen Reactions   Shellfish-Derived Products Anaphylaxis   Nsaids Other (See Comments)    H/o gastric ulcers = NO NSAIDs per GI   Zolpidem Tartrate Other (See Comments)    Hallucinations    Antimicrobials this admission: Cefepime  1/3 x 1 Vancomycin  1/3 x 1 Flagyl  1/3 x 1 Zosyn  1/4 x 1 Linezolid  1/4 x 1 Ceftriaxone  1/4 >> 1/6 Cefazolin  1/7 >>  Dose adjustments this admission:   Microbiology results: 1/3 BCx >> 2/2 E.coli (pan-sensitive) 1/4 BCx >> ngx2d 1/6 BCx >>  Thank you for allowing pharmacy to be a part of this patient's care.  Almarie Lunger, PharmD, BCPS, BCIDP Infectious Diseases Clinical Pharmacist 07/18/2023 7:56 AM   **Pharmacist phone directory  can now be found on [email or web address] ([email or web address]).  Listed under [facility].

## 2023-07-18 NOTE — Progress Notes (Signed)
 PHARMACY - ANTICOAGULATION CONSULT NOTE  Pharmacy Consult for heparin  Indication: chest pain/ACS  Allergies  Allergen Reactions   Shellfish-Derived Products Anaphylaxis   Nsaids Other (See Comments)    H/o gastric ulcers = NO NSAIDs per GI   Zolpidem Tartrate Other (See Comments)    Hallucinations    Patient Measurements: Height: 5' 7 (170.2 cm) Weight: 65.4 kg (144 lb 2.9 oz) IBW/kg (Calculated) : 66.1 Heparin  Dosing Weight: actual body weight  Vital Signs: Temp: 97.6 F (36.4 C) (01/07 0754) Temp Source: Oral (01/07 0754) BP: 138/98 (01/07 0600) Pulse Rate: 83 (01/07 0700)  Labs: Recent Labs    07/16/23 0307 07/16/23 0801 07/16/23 0959 07/16/23 1912 07/17/23 0259 07/17/23 1355 07/17/23 2207 07/18/23 0258 07/18/23 0755  HGB 8.6*  --   --   --  8.0*  --   --  9.6*  --   HCT 25.6*  --   --   --  23.4*  --   --  28.2*  --   PLT 235  --   --   --  243  --   --  286  --   HEPARINUNFRC  --   --   --    < > 0.15* 0.35 0.16*  --  0.17*  CREATININE 2.56* 2.98*  --   --  3.17*  --   --  3.39*  --   TROPONINIHS  --  2,264* 2,196*  --   --   --   --   --   --    < > = values in this interval not displayed.    Estimated Creatinine Clearance: 16.6 mL/min (A) (by C-G formula based on SCr of 3.39 mg/dL (H)).   Assessment: 78 year old male presented with abdominal pain. History of cholecystitis required perc chole drain placement in August. He was found to have E.coli bacteremia this admission and is on antibiotics. He has a history of atrial fibrillation but is not on anticoagulation given a history of bleeding. Patient with chest pain 1/5, now with significant elevation in troponin. Cardiology consulted, recommend heparin  infusion and cycling troponins. Some concern for NSTEMI in setting of physiologic stressors but not a good candidate for cardiac cath at this time. Pharmacy to manage heparin  infusion.  07/18/2023: Heparin  level drawn at 0755 am   = 0.17- remains  subtherapeutic after 1500 units bolus & rate increase to 900 units/hr  RN does report that patient has been bending his IV lines and she has had to straighten them a couple times during her shift so far; otherwise no other line issues nor pause in heparin  therapy CBC: Hg 8 >9.6 PLTC WNL/stable No bleeding reported by RN Per cards plan is 48 hrs of heparin  then stop. Heparin  was started 1/5 @ 11:15 am  Goal of Therapy:  Heparin  level 0.3-0.7 units/ml Monitor platelets by anticoagulation protocol: Yes   Plan: d/w TRH MD> heparin  x 48 hours Stop heparin  at noon - will have completed 48 hours Start heparin  5000 units SQ q8h for VTE prophylaxis DC daily CBC, heparin  level  Rosaline IVAR Edison, Pharm.D Use secure chat for questions 07/18/2023 8:48 AM

## 2023-07-18 NOTE — Plan of Care (Signed)
  Problem: Fluid Volume: Goal: Ability to maintain a balanced intake and output will improve Outcome: Progressing   Problem: Metabolic: Goal: Ability to maintain appropriate glucose levels will improve Outcome: Progressing   Problem: Skin Integrity: Goal: Risk for impaired skin integrity will decrease Outcome: Progressing   Problem: Clinical Measurements: Goal: Will remain free from infection Outcome: Progressing Goal: Diagnostic test results will improve Outcome: Progressing   Problem: Elimination: Goal: Will not experience complications related to bowel motility Outcome: Progressing Note: Had first BM since admission  Goal: Will not experience complications related to urinary retention Outcome: Progressing

## 2023-07-19 DIAGNOSIS — I5023 Acute on chronic systolic (congestive) heart failure: Secondary | ICD-10-CM | POA: Diagnosis not present

## 2023-07-19 DIAGNOSIS — N179 Acute kidney failure, unspecified: Secondary | ICD-10-CM | POA: Diagnosis not present

## 2023-07-19 DIAGNOSIS — A4151 Sepsis due to Escherichia coli [E. coli]: Secondary | ICD-10-CM | POA: Diagnosis not present

## 2023-07-19 DIAGNOSIS — K8010 Calculus of gallbladder with chronic cholecystitis without obstruction: Secondary | ICD-10-CM | POA: Diagnosis not present

## 2023-07-19 LAB — SODIUM, URINE, RANDOM: Sodium, Ur: 86 mmol/L

## 2023-07-19 LAB — GLUCOSE, CAPILLARY
Glucose-Capillary: 100 mg/dL — ABNORMAL HIGH (ref 70–99)
Glucose-Capillary: 118 mg/dL — ABNORMAL HIGH (ref 70–99)
Glucose-Capillary: 142 mg/dL — ABNORMAL HIGH (ref 70–99)
Glucose-Capillary: 156 mg/dL — ABNORMAL HIGH (ref 70–99)
Glucose-Capillary: 200 mg/dL — ABNORMAL HIGH (ref 70–99)
Glucose-Capillary: 240 mg/dL — ABNORMAL HIGH (ref 70–99)
Glucose-Capillary: 300 mg/dL — ABNORMAL HIGH (ref 70–99)

## 2023-07-19 LAB — COMPREHENSIVE METABOLIC PANEL
ALT: 372 U/L — ABNORMAL HIGH (ref 0–44)
AST: 715 U/L — ABNORMAL HIGH (ref 15–41)
Albumin: 2.7 g/dL — ABNORMAL LOW (ref 3.5–5.0)
Alkaline Phosphatase: 131 U/L — ABNORMAL HIGH (ref 38–126)
Anion gap: 14 (ref 5–15)
BUN: 80 mg/dL — ABNORMAL HIGH (ref 8–23)
CO2: 17 mmol/L — ABNORMAL LOW (ref 22–32)
Calcium: 8.7 mg/dL — ABNORMAL LOW (ref 8.9–10.3)
Chloride: 98 mmol/L (ref 98–111)
Creatinine, Ser: 3.18 mg/dL — ABNORMAL HIGH (ref 0.61–1.24)
GFR, Estimated: 19 mL/min — ABNORMAL LOW (ref >60)
Glucose, Bld: 118 mg/dL — ABNORMAL HIGH (ref 70–99)
Potassium: 4.7 mmol/L (ref 3.5–5.1)
Sodium: 129 mmol/L — ABNORMAL LOW (ref 135–145)
Total Bilirubin: 1.7 mg/dL — ABNORMAL HIGH (ref 0.0–1.2)
Total Protein: 5.9 g/dL — ABNORMAL LOW (ref 6.5–8.1)

## 2023-07-19 LAB — CBC
HCT: 22.9 % — ABNORMAL LOW (ref 39.0–52.0)
Hemoglobin: 8.3 g/dL — ABNORMAL LOW (ref 13.0–17.0)
MCH: 29 pg (ref 26.0–34.0)
MCHC: 36.2 g/dL — ABNORMAL HIGH (ref 30.0–36.0)
MCV: 80.1 fL (ref 80.0–100.0)
Platelets: 272 10*3/uL (ref 150–400)
RBC: 2.86 MIL/uL — ABNORMAL LOW (ref 4.22–5.81)
RDW: 17.2 % — ABNORMAL HIGH (ref 11.5–15.5)
WBC: 10.1 10*3/uL (ref 4.0–10.5)
nRBC: 0.3 % — ABNORMAL HIGH (ref 0.0–0.2)

## 2023-07-19 LAB — CREATININE, URINE, RANDOM: Creatinine, Urine: 50 mg/dL

## 2023-07-19 MED ORDER — TRAZODONE HCL 50 MG PO TABS: 25.0000 mg | ORAL_TABLET | Freq: Every evening | ORAL | Status: DC | PRN

## 2023-07-19 MED ORDER — MELATONIN 5 MG PO TABS: 5.0000 mg | ORAL_TABLET | Freq: Every evening | ORAL | Status: DC | PRN

## 2023-07-19 MED ORDER — STERILE WATER FOR INJECTION IV SOLN
INTRAVENOUS | Status: DC
  Filled 2023-07-19: qty 150
  Filled 2023-07-19 (×2): qty 1000
  Filled 2023-07-19 (×2): qty 150

## 2023-07-19 MED ORDER — QUETIAPINE FUMARATE 50 MG PO TABS
25.0000 mg | ORAL_TABLET | Freq: Once | ORAL | Status: AC
  Administered 2023-07-19: 25 mg via ORAL
  Filled 2023-07-19: qty 1

## 2023-07-19 NOTE — Progress Notes (Signed)
Subjective/Chief Complaint: Feels better today Appetite improving Had BM's Denies abdominal pain   Objective: Vital signs in last 24 hours: Temp:  [97.5 F (36.4 C)-97.7 F (36.5 C)] 97.5 F (36.4 C) (01/08 0737) Pulse Rate:  [70-92] 79 (01/08 0751) Resp:  [13-27] 20 (01/08 0751) BP: (103-153)/(49-119) 137/78 (01/08 0737) SpO2:  [93 %-100 %] 99 % (01/08 0751) Weight:  [62.5 kg] 62.5 kg (01/08 0500) Last BM Date : 07/18/23  Intake/Output from previous day: 01/07 0701 - 01/08 0700 In: 408.5 [I.V.:258.6; IV Piggyback:149.9] Out: 550 [Urine:550] Intake/Output this shift: Total I/O In: -  Out: 200 [Urine:200]  Exam: Awake and alert, eating breakfast Abdomen soft, non-tender, no guarding  Lab Results:  Recent Labs    07/18/23 0258 07/19/23 0255  WBC 13.6* 10.1  HGB 9.6* 8.3*  HCT 28.2* 22.9*  PLT 286 272   BMET Recent Labs    07/18/23 0258 07/19/23 0255  NA 133* 129*  K 4.8 4.7  CL 100 98  CO2 15* 17*  GLUCOSE 135* 118*  BUN 79* 80*  CREATININE 3.39* 3.18*  CALCIUM  9.0 8.7*   PT/INR No results for input(s): LABPROT, INR in the last 72 hours. ABG Recent Labs    07/16/23 0944  PHART 7.41  HCO3 19.6*    Studies/Results: No results found.  Anti-infectives: Anti-infectives (From admission, onward)    Start     Dose/Rate Route Frequency Ordered Stop   07/18/23 2200  ceFAZolin  (ANCEF ) IVPB 1 g/50 mL premix        1 g 100 mL/hr over 30 Minutes Intravenous Every 12 hours 07/18/23 0754     07/18/23 1000  ceFAZolin  (ANCEF ) IVPB 2g/100 mL premix        2 g 200 mL/hr over 30 Minutes Intravenous  Once 07/18/23 0754 07/18/23 1022   07/15/23 1300  cefTRIAXone  (ROCEPHIN ) 2 g in sodium chloride  0.9 % 100 mL IVPB  Status:  Discontinued        2 g 200 mL/hr over 30 Minutes Intravenous Every 24 hours 07/15/23 1122 07/18/23 0747   07/15/23 1000  linezolid  (ZYVOX ) IVPB 600 mg  Status:  Discontinued        600 mg 300 mL/hr over 60 Minutes Intravenous  Every 12 hours 07/15/23 0217 07/15/23 1124   07/15/23 0600  piperacillin -tazobactam (ZOSYN ) IVPB 3.375 g  Status:  Discontinued        3.375 g 12.5 mL/hr over 240 Minutes Intravenous Every 8 hours 07/14/23 2230 07/15/23 1122   07/14/23 2215  metroNIDAZOLE  (FLAGYL ) IVPB 500 mg        500 mg 100 mL/hr over 60 Minutes Intravenous  Once 07/14/23 2208 07/14/23 2336   07/14/23 1945  vancomycin  (VANCOCIN ) IVPB 1000 mg/200 mL premix        1,000 mg 200 mL/hr over 60 Minutes Intravenous STAT 07/14/23 1937 07/14/23 2153   07/14/23 1915  ceFEPIme  (MAXIPIME ) 2 g in sodium chloride  0.9 % 100 mL IVPB        2 g 200 mL/hr over 30 Minutes Intravenous  Once 07/14/23 1906 07/14/23 2013       Assessment/Plan: Patient with sepsis, multiple medical problems, prior percutaneous cholecystostomy tube for cholecystitis. HIDA scan shows delayed filling of the gallbladder consistent with chronic cholecystitis  -clinically he looks better today -WBC now normal and LFT's improved from yesterday.  Creatinine stable -given improvement and lack of pain and tenderness, will hold off on an IR consult for a percutaneous cholecystostomy tube   Vicenta Poli  07/19/2023  

## 2023-07-19 NOTE — Consult Note (Signed)
 Renal Service Consult Note [facility]  Carrson Lightcap 07/19/2023 Lamar JONETTA Fret, MD Requesting Physician: Dr. Drusilla  Reason for Consult: renal faliure  HPI: The patient is a 78 y.o. year-old w/ PMH of syst CHF EF 35-40%, DM2, h/o AVR, atrial fib, CKD 3a who was admitted to [facility] on 1/03 due to gen'd weakness, hypotension, afib, lactic acidosis, ^LFT's and shock. WBC was 23 K. We was admitted to ICU and was rx'd w/ vasopressor support, 2L IVF's and IV abx vanc/zosyn . Blood cx's grew [facility] thought to be due to chronic cholecystitis. IV abx were de-escalated and he was able to come off pressors. Echo was unchanged.  Gen surgery saw, pt had hx of prior perc cholecystostomy tube. Pt was not felt to surgical candidate due to dementia and other comoribidities.  Cards was consulted for^ trop 2264, felt to be demand ischemia; not candidate for heart cath, rec'd IV heparin , cont asa/ MTP. Home BP meds were on hold due to shock. Creat was 1.8 on admission and peaked at 3.3 yesterday and today is down to 3.1. We are asked to see for renal failure.   Pt seen in ICU bed. Hands are in mittens, pt is a bit confused and not able to answer questions very well.    ROS - n/o , poor historian  Past Medical History  Past Medical History:  Diagnosis Date   Allergy    Aortic stenosis    Arthritis    Balance problem 04/26/2022   Blood transfusion without reported diagnosis    CAD (coronary artery disease)    Cataract    both eyes surgically removed   Diabetes mellitus, type 2 (HCC)    Diverticulosis of colon (without mention of hemorrhage)    Duodenal ulcer perforation (HCC)    blood transfusion   Flesh-eating bacteria (HCC) 1995   GERD (gastroesophageal reflux disease)    Glaucoma    pre glaucoma on Xalatan  drops   Heart murmur    History of diabetic retinopathy    History of gastrointestinal bleeding    History of necrotizing fasciitis    History of prosthetic aortic valve     Hypercholesteremia    Myocardial infarction (HCC) 02/23/2016   Neuropathic pain 04/26/2022   Nightmares    CHRONIC   OSA (obstructive sleep apnea)    borderline no CPAP    Personal history of colonic polyps 11/23/2009   TUBULAR ADENOMA   Postoperative anemia    Sinus bradycardia    Sleep apnea    no cpap   Stomach ulcer    Hx of   Systolic hypertension    Vitamin B12 deficiency    Past Surgical History  Past Surgical History:  Procedure Laterality Date   AORTIC VALVE REPLACEMENT     BLEPHAROPLASTY  11/24/2014   CARDIAC CATHETERIZATION N/A 03/01/2016   Procedure: Coronary/Graft Angiography;  Surgeon: Peter M Jordan, MD;  Location: [facility] INVASIVE CV LAB;  Service: Cardiovascular;  Laterality: N/A;   CATARACT EXTRACTION W/ INTRAOCULAR LENS IMPLANT     OD   COLONOSCOPY     CORONARY ARTERY BYPASS GRAFT  2010   x 4   flesh eating disease surgery      surgeries x 7    HERNIA REPAIR  1972   IR EXCHANGE BILIARY DRAIN  03/07/2023   IR EXCHANGE BILIARY DRAIN  04/18/2023   IR PERC CHOLECYSTOSTOMY  03/02/2023   POLYPECTOMY     RETINAL LASER PROCEDURE     SCROTAL SURGERY  1995   resection   UMBILICAL HERNIA REPAIR N/A 04/10/2013   open UHR w Ventralex mesh patch - Dr Gladis   Family History  Family History  Problem Relation Age of Onset   Cirrhosis Father    Alcohol  abuse Father        father murdered   Rectal cancer Mother    Diabetes Mother    Colon cancer Mother 44       rectal cancer   Hypertension Mother    Hypertension Sister    Diabetes Sister    Heart attack Neg Hx    Stroke Neg Hx    Colon polyps Neg Hx    Esophageal cancer Neg Hx    Stomach cancer Neg Hx    Social History  reports that he has never smoked. He has never used smokeless tobacco. He reports that he does not drink alcohol  and does not use drugs. Allergies  Allergies  Allergen Reactions   Shellfish-Derived Products Anaphylaxis   Nsaids Other (See Comments)    H/o gastric ulcers = NO NSAIDs per GI    Zolpidem Tartrate Other (See Comments)    Hallucinations   Home medications Prior to Admission medications   Medication Sig Start Date End Date Taking? Authorizing Provider  allopurinol  (ZYLOPRIM ) 300 MG tablet Take 300 mg by mouth daily.   Yes [provider]  aspirin  EC 81 MG tablet Take 81 mg by mouth daily. Swallow whole.   Yes [provider]  atorvastatin  (LIPITOR) 80 MG tablet Take 1 tablet (80 mg total) by mouth daily. Patient taking differently: Take 20 mg by mouth daily. 03/13/23  Yes Fairy Frames, MD  Coenzyme Q10 (COQ10) 100 MG CAPS Take 100 mg by mouth in the morning and at bedtime.   Yes [provider]  fish oil-omega-3 fatty acids 1000 MG capsule Take 1 g by mouth 2 (two) times daily.   Yes [provider]  hydrochlorothiazide  (MICROZIDE ) 12.5 MG capsule Take 12.5 mg by mouth daily.   Yes [provider]  isosorbide  dinitrate (ISORDIL ) 30 MG tablet Take 30 mg by mouth daily.   Yes [provider]  latanoprost  (XALATAN ) 0.005 % ophthalmic solution Place 1 drop into both eyes at bedtime.   Yes [provider]  metFORMIN (GLUCOPHAGE) 1000 MG tablet Take 1,000 mg by mouth in the morning and at bedtime.   Yes [provider]  metoprolol  succinate (TOPROL -XL) 50 MG 24 hr tablet Take 1 tablet (50 mg total) by mouth daily. Take with or immediately following a meal. Patient taking differently: Take 50 mg by mouth 2 (two) times daily. 03/14/23  Yes Fairy Frames, MD  minoxidil (LONITEN) 10 MG tablet Take 10 mg by mouth daily.  07/20/15  Yes [provider]  nitroGLYCERIN  (NITROSTAT ) 0.4 MG SL tablet Place 1 tablet under the tongue as needed. 09/01/16  Yes [provider]  omeprazole  (PRILOSEC) 40 MG capsule Take 40 mg by mouth 2 (two) times daily.   Yes [provider]  spironolactone  (ALDACTONE ) 25 MG tablet Take 0.5 tablets (12.5 mg total) by mouth daily. 04/21/23  Yes Wyn Jackee VEAR Mickey.,  NP  empagliflozin  (JARDIANCE ) 10 MG TABS tablet Take 1 tablet (10 mg total) by mouth daily. Patient not taking: Reported on 07/15/2023 04/21/23   Wyn Jackee VEAR Mickey., NP  saccharomyces boulardii (FLORASTOR) 250 MG capsule Take 1 capsule (250 mg total) by mouth 2 (two) times daily. Patient not taking: Reported on 07/15/2023 03/13/23   Fairy,  Sigurd, MD     Vitals:   07/19/23 0957 07/19/23 0958 07/19/23 1000 07/19/23 1101  BP: (!) 133/90 (!) 133/90 (!) 140/89 136/75  Pulse: (!) 105 88 80 86  Resp:  (!) 23 20 19   Temp:    97.6 F (36.4 C)  TempSrc:      SpO2:  97% 97% 96%  Weight:      Height:       Exam Gen alert, no distress, elderly No rash, cyanosis or gangrene Sclera anicteric, throat dry No jvd or bruits Chest clear bilat to bases, no rales/ wheezing RRR no MRG Abd soft ntnd no mass or ascites +bs GU condom cath in place MS no joint effusions or deformity Ext no LE or UE edema, no wounds or ulcers Neuro is alert, Ox 3 , nf      Date   Creat  eGFR (ml/min) 2009- 2019  1.10- 1.50 2023   1.04 February 2023  1.4- 1.5 Aug    1.28- 2.75 04/20/23  1.70  41 ml/min  1/03   1.96 1/07   3.39 1/08   3.18    UA 1/3 - negative   UNa, UCr pending   CT abd/pelv on 1/03 + 75 cc IV contrast --> Adrenals/Urinary Tract: Adrenal glands are within normal limits. Bilateral renal cysts are noted which appear simple in nature and stable from previous exam. No follow-up is recommended. No renal calculi or obstructive changes are seen. The bladder is decompressed.      Assessment/ Plan: AKI on CKD 3b - b/l creat 1.70 from October 2024, eGFR 41 ml/min.  Creat was 1.9 on admission and peaked at 3.3 yest, down to 3.1 today.  This is in the setting of acute shock w/ ecoli sepsis likely related to GB infection. No hydro bilat on CT abdomen. Was exposed to IV contrast w/ CT on 1/03. UA neg. Suspect AKI is related to septic shock on admission+ IV contrast + vol depletion. No vol excess, +dry mouth. Up  4kg from admission. On room air. Creat down today, appears to be recovering. Will start gentle IVF's w/ bicarb gtt at 65 cc/hr. Cont supportive care.  Met acidosis - from renal failure, sepsis , shock --> bicarb gtt at 65/hr  HTN - getting toprol  xl , bp's are stable.  Ecoli bacteremia - due to possible chronic acalculous cholecystitis. On IV rocephin .  Dementia       Rob Geralynn  MD [facility] 07/19/2023, 11:42 AM  Recent Labs  Lab 07/15/23 0257 07/15/23 0259 07/15/23 1148 07/18/23 0258 07/19/23 0255  HGB 9.3*  --    < > 9.6* 8.3*  ALBUMIN 2.9*  --    < > 2.8* 2.7*  CALCIUM  8.9  --    < > 9.0 8.7*  PHOS 3.7 3.6  --   --   --   CREATININE 1.82*  --    < > 3.39* 3.18*  K 4.6 4.8   < > 4.8 4.7   < > = values in this interval not displayed.   Inpatient medications:  aspirin  EC  81 mg Oral Daily   Chlorhexidine  Gluconate Cloth  6 each Topical Daily   heparin  injection (subcutaneous)  5,000 Units Subcutaneous Q8H   insulin  aspart  0-9 Units Subcutaneous Q4H   metoprolol  succinate  100 mg Oral Daily   polycarbophil  625 mg Oral BID     ceFAZolin  (ANCEF ) IV 1 g (07/19/23 1058)   acetaminophen , acetaminophen , alum & mag hydroxide-simeth,  docusate sodium , HYDROmorphone  (DILAUDID ) injection, magic mouthwash, ondansetron  (ZOFRAN ) IV, mouth rinse, polyethylene glycol, prochlorperazine , simethicone , sodium chloride , traMADol 

## 2023-07-19 NOTE — Progress Notes (Signed)
    Patient Name: Nathan [name]           DOB: 01-Sep-1945  MRN: [id number]      Admission Date: 07/14/2023  Attending Provider: Drusilla Sabas RAMAN, MD  Primary Diagnosis: Sepsis [facility])   Level of care: Stepdown    CROSS COVER NOTE   Date of Service   07/19/2023   Nathan [name], 78 y.o. male, was admitted on 07/14/2023 for Sepsis [facility]).    HPI/Events of Note   Worsening agitation, restlessness, confusion. PMHx of dementia? previous hospital acquired delirium per spouse  RN reports poor sleep.  Unfortunately, he is not following commands despite multiple attempts at redirection by staff.    Interventions/ Plan   Will trial 25 mg Seroquel  for agitation.        Jerline Linzy, DNP, ACNPC- AG [facility]

## 2023-07-19 NOTE — Progress Notes (Addendum)
 [facility]  PROGRESS NOTE  Nathan [name] FMW:[id number] DOB: 25-Mar-1946 DOA: 07/14/2023 PCP: Yolande Toribio MATSU, MD   Brief HPI:   Present Illness:  Patient is a 78 year old male with pertinent PMH previous cholecystitis (had perc choley drain placed 03/02/2023), DMT2, HTN, HLD, PAF not on AC due previous bleeding, systolic CHF, CAD s/p CABG and AVR, CKD 3a, OSA presents to [facility] ED on 1/3 with abdominal pain/sepsis. Patient last admitted 02/2023 with chest pain. Initially concern of MI with some acute systolic CHF. Patient also with abdominal pain and imaging showing cholecystitis. Per choley drain placed. Discharged on IV abx. Over the past 3 days patient having generalized weakness and confusion. On 1/3 went to [facility] urgent care and BP 68/42. Was recommended to come to [facility] ED for further eval. Initial BP 109/72. Sats stable on room air. Afebrile. Patient having some abdominal pain.   CT ABD/pelvis showing inflammatory changes of gallbladder consistent with acute cholecystitis.   Assessment/Plan:   Septic shock secondary to E.coli bacteremia in setting of chronic calculus cholecystitis - shock physiology has resolved -Hida scan neg for obstruction, delayed filling during 2nd hour -Pressors has been  weaned off -Started on ceftriaxone , changed to cefazolin  as E. coli from blood culture is pansensitive -Blood cultures from 07/14/2023 grew E. Coli -Repeat blood cultures from 07/15/2023 as well as 1/625 have been negative so far -General Surgery following, and considering plan for placing another cholecystostomy tube -He is not a candidate for surgical option   Acute on Chronic Systolic Heart Failure (EF 35-40%) Atrial Fibrillation CAD, aortic stenosis s/p CABG x 3 with AVR on 10/2008  Echo unchanged -Cardiology following, recommend IV heparin  for 48 hours; IV heparin  has been transitioned to subcutaneous heparin  -Troponin elevation due to demand ischemia -No goal-directed medical  therapy due to hypotension -Started on metoprolol  succinate    AKI with metabolic acidosis- worsening -Likely in setting of hypotension -Today creatinine has improved to 3.18 -Consulted nephrology for further management  Lactic Acidosis - resolved  Elevated Liver enzymes -  -LFTs are worse likely in setting of hypotension -LFTs slowly improving -Follow LFTs in a.m.   Melanoma  - has appointment at [facility] on 1/8 to see specialist   DMII -Continue sliding scale insulin  with NovoLog  -CBG well-controlled    Medications     aspirin  EC  81 mg Oral Daily   Chlorhexidine  Gluconate Cloth  6 each Topical Daily   heparin  injection (subcutaneous)  5,000 Units Subcutaneous Q8H   insulin  aspart  0-9 Units Subcutaneous Q4H   metoprolol  succinate  100 mg Oral Daily   polycarbophil  625 mg Oral BID     Data Reviewed:   CBG:  Recent Labs  Lab 07/18/23 1529 07/18/23 2001 07/18/23 2357 07/19/23 0355 07/19/23 0723  GLUCAP 141* 166* 156* 100* 118*    SpO2: 97 % O2 Flow Rate (L/min): 2 L/min    Vitals:   07/19/23 0500 07/19/23 0600 07/19/23 0700 07/19/23 0737  BP: (!) 146/91 131/84 (!) 153/95   Pulse: 84 85 82   Resp: 18 17 17    Temp:    (!) 97.5 F (36.4 C)  TempSrc:    Oral  SpO2: 100% 98% 97%   Weight: 62.5 kg     Height:          Data Reviewed:  Basic Metabolic Panel: Recent Labs  Lab 07/15/23 0257 07/15/23 0259 07/15/23 1148 07/16/23 0307 07/16/23 0801 07/17/23 0259 07/18/23 0258 07/19/23 0255  NA 130*  --    < >  131* 132* 132* 133* 129*  K 4.6 4.8   < > 4.5 4.4 4.3 4.8 4.7  CL 99  --    < > 99 100 100 100 98  CO2 15*  --    < > 19* 15* 16* 15* 17*  GLUCOSE 157*  --    < > 106* 112* 132* 135* 118*  BUN 50*  --    < > 61* 64* 71* 79* 80*  CREATININE 1.82*  --    < > 2.56* 2.98* 3.17* 3.39* 3.18*  CALCIUM  8.9  --    < > 8.8* 8.7* 8.7* 9.0 8.7*  MG 1.4* 1.4*  --   --  2.4  --   --   --   PHOS 3.7 3.6  --   --   --   --   --   --    < > = values in  this interval not displayed.    CBC: Recent Labs  Lab 07/14/23 1847 07/15/23 0257 07/16/23 0307 07/17/23 0259 07/18/23 0258 07/19/23 0255  WBC 24.3* 27.0* 18.3* 13.9* 13.6* 10.1  NEUTROABS 21.7*  --   --   --   --   --   HGB 10.1* 9.3* 8.6* 8.0* 9.6* 8.3*  HCT 29.2* 29.2* 25.6* 23.4* 28.2* 22.9*  MCV 86.4 89.6 85.6 83.3 84.4 80.1  PLT 263 284 235 243 286 272    LFT Recent Labs  Lab 07/15/23 0257 07/16/23 0307 07/17/23 0259 07/18/23 0258 07/19/23 0255  AST 151* 97* 64* 772* 715*  ALT 153* 109* 87* 394* 372*  ALKPHOS 149* 131* 142* 166* 131*  BILITOT 3.5* 1.5* 1.4* 1.4* 1.7*  PROT 6.1* 5.5* 5.5* 6.6 5.9*  ALBUMIN 2.9* 2.5* 2.3* 2.8* 2.7*     Antibiotics: Anti-infectives (From admission, onward)    Start     Dose/Rate Route Frequency Ordered Stop   07/18/23 2200  ceFAZolin  (ANCEF ) IVPB 1 g/50 mL premix        1 g 100 mL/hr over 30 Minutes Intravenous Every 12 hours 07/18/23 0754     07/18/23 1000  ceFAZolin  (ANCEF ) IVPB 2g/100 mL premix        2 g 200 mL/hr over 30 Minutes Intravenous  Once 07/18/23 0754 07/18/23 1022   07/15/23 1300  cefTRIAXone  (ROCEPHIN ) 2 g in sodium chloride  0.9 % 100 mL IVPB  Status:  Discontinued        2 g 200 mL/hr over 30 Minutes Intravenous Every 24 hours 07/15/23 1122 07/18/23 0747   07/15/23 1000  linezolid  (ZYVOX ) IVPB 600 mg  Status:  Discontinued        600 mg 300 mL/hr over 60 Minutes Intravenous Every 12 hours 07/15/23 0217 07/15/23 1124   07/15/23 0600  piperacillin -tazobactam (ZOSYN ) IVPB 3.375 g  Status:  Discontinued        3.375 g 12.5 mL/hr over 240 Minutes Intravenous Every 8 hours 07/14/23 2230 07/15/23 1122   07/14/23 2215  metroNIDAZOLE  (FLAGYL ) IVPB 500 mg        500 mg 100 mL/hr over 60 Minutes Intravenous  Once 07/14/23 2208 07/14/23 2336   07/14/23 1945  vancomycin  (VANCOCIN ) IVPB 1000 mg/200 mL premix        1,000 mg 200 mL/hr over 60 Minutes Intravenous STAT 07/14/23 1937 07/14/23 2153   07/14/23 1915   ceFEPIme  (MAXIPIME ) 2 g in sodium chloride  0.9 % 100 mL IVPB        2 g 200 mL/hr over 30 Minutes Intravenous  Once 07/14/23 1906 07/14/23 2013        DVT prophylaxis: Patient on heparin   Code Status: DNR  Family Communication: Wife at bedside   CONSULTS General Surgery, cardiology   Subjective   Denies chest pain or shortness of breath.  Denies abdominal pain.   Objective    Physical Examination:  General-appears in no acute distress Heart-S1-S2, regular, no murmur auscultated Lungs-clear to auscultation bilaterally, no wheezing or crackles auscultated Abdomen-soft, nontender, no organomegaly Extremities-no edema in the lower extremities Neuro-alert, oriented x3, no focal deficit noted   Status is: Inpatient:             Nathan [name]   [facility] If 7PM-7AM, please contact night-coverage at [email or web address], Office  [phone]   07/19/2023, 7:51 AM  LOS: 4 days

## 2023-07-20 DIAGNOSIS — R6521 Severe sepsis with septic shock: Secondary | ICD-10-CM | POA: Diagnosis not present

## 2023-07-20 DIAGNOSIS — N170 Acute kidney failure with tubular necrosis: Secondary | ICD-10-CM | POA: Diagnosis not present

## 2023-07-20 DIAGNOSIS — A4151 Sepsis due to Escherichia coli [E. coli]: Secondary | ICD-10-CM | POA: Diagnosis not present

## 2023-07-20 LAB — COMPREHENSIVE METABOLIC PANEL
ALT: 123 U/L — ABNORMAL HIGH (ref 0–44)
AST: 231 U/L — ABNORMAL HIGH (ref 15–41)
Albumin: 2.4 g/dL — ABNORMAL LOW (ref 3.5–5.0)
Alkaline Phosphatase: 113 U/L (ref 38–126)
Anion gap: 13 (ref 5–15)
BUN: 70 mg/dL — ABNORMAL HIGH (ref 8–23)
CO2: 18 mmol/L — ABNORMAL LOW (ref 22–32)
Calcium: 8.7 mg/dL — ABNORMAL LOW (ref 8.9–10.3)
Chloride: 105 mmol/L (ref 98–111)
Creatinine, Ser: 2.47 mg/dL — ABNORMAL HIGH (ref 0.61–1.24)
GFR, Estimated: 26 mL/min — ABNORMAL LOW (ref >60)
Glucose, Bld: 119 mg/dL — ABNORMAL HIGH (ref 70–99)
Potassium: 4.4 mmol/L (ref 3.5–5.1)
Sodium: 136 mmol/L (ref 135–145)
Total Bilirubin: 1.3 mg/dL — ABNORMAL HIGH (ref 0.0–1.2)
Total Protein: 5.1 g/dL — ABNORMAL LOW (ref 6.5–8.1)

## 2023-07-20 LAB — GLUCOSE, CAPILLARY
Glucose-Capillary: 133 mg/dL — ABNORMAL HIGH (ref 70–99)
Glucose-Capillary: 104 mg/dL — ABNORMAL HIGH (ref 70–99)
Glucose-Capillary: 109 mg/dL — ABNORMAL HIGH (ref 70–99)
Glucose-Capillary: 136 mg/dL — ABNORMAL HIGH (ref 70–99)
Glucose-Capillary: 185 mg/dL — ABNORMAL HIGH (ref 70–99)
Glucose-Capillary: 127 mg/dL — ABNORMAL HIGH (ref 70–99)

## 2023-07-20 LAB — CULTURE, BLOOD (ROUTINE X 2): Culture: NO GROWTH

## 2023-07-20 MED ORDER — QUETIAPINE FUMARATE 25 MG PO TABS
25.0000 mg | ORAL_TABLET | Freq: Every day | ORAL | Status: DC
  Administered 2023-07-20 – 2023-07-23 (×4): 25 mg via ORAL
  Filled 2023-07-20 (×4): qty 1

## 2023-07-20 MED ORDER — HALOPERIDOL LACTATE 5 MG/ML IJ SOLN
1.0000 mg | Freq: Four times a day (QID) | INTRAMUSCULAR | Status: DC | PRN
  Administered 2023-07-21: 1 mg via INTRAVENOUS
  Filled 2023-07-20: qty 1

## 2023-07-20 MED ORDER — CEFAZOLIN SODIUM-DEXTROSE 2-4 GM/100ML-% IV SOLN
2.0000 g | Freq: Two times a day (BID) | INTRAVENOUS | Status: DC
  Administered 2023-07-20 – 2023-07-21 (×4): 2 g via INTRAVENOUS
  Filled 2023-07-20 (×4): qty 100

## 2023-07-20 NOTE — Plan of Care (Signed)
  Problem: Elimination: Goal: Will not experience complications related to bowel motility Outcome: Progressing Goal: Will not experience complications related to urinary retention Outcome: Progressing   Problem: Pain Management: Goal: General experience of comfort will improve Outcome: Progressing   Problem: Skin Integrity: Goal: Risk for impaired skin integrity will decrease Outcome: Progressing

## 2023-07-20 NOTE — Progress Notes (Signed)
 Subjective/Chief Complaint: Confused and restrained this morning   Objective: Vital signs in last 24 hours: Temp:  [97.2 F (36.2 C)-97.9 F (36.6 C)] 97.3 F (36.3 C) (01/09 0736) Pulse Rate:  [42-105] 82 (01/09 0600) Resp:  [14-26] 16 (01/09 0600) BP: (88-149)/(50-96) 88/51 (01/09 0600) SpO2:  [95 %-100 %] 95 % (01/09 0600) Weight:  [61.2 kg-64.8 kg] 61.2 kg (01/09 0445) Last BM Date : (P) 07/18/23  Intake/Output from previous day: 01/08 0701 - 01/09 0700 In: 459.5 [I.V.:409.5; IV Piggyback:50] Out: 1300 [Urine:1300] Intake/Output this shift: Total I/O In: 536.3 [I.V.:536.3] Out: -   Exam: Awake but confused, restrained Abdomen is soft with no tenderness or guarding  Lab Results:  Recent Labs    07/18/23 0258 07/19/23 0255  WBC 13.6* 10.1  HGB 9.6* 8.3*  HCT 28.2* 22.9*  PLT 286 272   BMET Recent Labs    07/19/23 0255 07/20/23 0252  NA 129* 136  K 4.7 4.4  CL 98 105  CO2 17* 18*  GLUCOSE 118* 119*  BUN 80* 70*  CREATININE 3.18* 2.47*  CALCIUM  8.7* 8.7*   PT/INR No results for input(s): LABPROT, INR in the last 72 hours. ABG No results for input(s): PHART, HCO3 in the last 72 hours.  Invalid input(s): PCO2, PO2  Studies/Results: No results found.  Anti-infectives: Anti-infectives (From admission, onward)    Start     Dose/Rate Route Frequency Ordered Stop   07/18/23 2200  ceFAZolin  (ANCEF ) IVPB 1 g/50 mL premix        1 g 100 mL/hr over 30 Minutes Intravenous Every 12 hours 07/18/23 0754     07/18/23 1000  ceFAZolin  (ANCEF ) IVPB 2g/100 mL premix        2 g 200 mL/hr over 30 Minutes Intravenous  Once 07/18/23 0754 07/18/23 1022   07/15/23 1300  cefTRIAXone  (ROCEPHIN ) 2 g in sodium chloride  0.9 % 100 mL IVPB  Status:  Discontinued        2 g 200 mL/hr over 30 Minutes Intravenous Every 24 hours 07/15/23 1122 07/18/23 0747   07/15/23 1000  linezolid  (ZYVOX ) IVPB 600 mg  Status:  Discontinued        600 mg 300 mL/hr over 60  Minutes Intravenous Every 12 hours 07/15/23 0217 07/15/23 1124   07/15/23 0600  piperacillin -tazobactam (ZOSYN ) IVPB 3.375 g  Status:  Discontinued        3.375 g 12.5 mL/hr over 240 Minutes Intravenous Every 8 hours 07/14/23 2230 07/15/23 1122   07/14/23 2215  metroNIDAZOLE  (FLAGYL ) IVPB 500 mg        500 mg 100 mL/hr over 60 Minutes Intravenous  Once 07/14/23 2208 07/14/23 2336   07/14/23 1945  vancomycin  (VANCOCIN ) IVPB 1000 mg/200 mL premix        1,000 mg 200 mL/hr over 60 Minutes Intravenous STAT 07/14/23 1937 07/14/23 2153   07/14/23 1915  ceFEPIme  (MAXIPIME ) 2 g in sodium chloride  0.9 % 100 mL IVPB        2 g 200 mL/hr over 30 Minutes Intravenous  Once 07/14/23 1906 07/14/23 2013       Assessment/Plan: Patient with sepsis, multiple medical problems, prior percutaneous cholecystostomy tube for cholecystitis. HIDA scan shows delayed filling of the gallbladder consistent with chronic cholecystitis  -LFT's improved further  -abdomen remains benign on exam -creatinine improved -given above, no plans to ask IR to place a drain in the gallbladder unless he acutely worsens.  Would continue IV antibiotics for now   Vicenta Poli  MD 07/20/2023

## 2023-07-20 NOTE — Progress Notes (Addendum)
 PROGRESS NOTE    Nathan [name]  FMW:[id number] DOB: 1945-11-27 DOA: 07/14/2023 PCP: Yolande Toribio MATSU, MD   Brief Narrative:  78 year old male with history of cholecystitis (had percutaneous cholecystectomy drain placed on 03/02/2023), diabetes mellitus type 2, hyperlipidemia, hypertension, paroxysmal A-fib not on anticoagulation due to history of bleeding, chronic systolic CHF, CAD status post CABG and AVR, CKD stage IIIa, OSA presented with worsening abdominal pain.  CT of abdomen and pelvis showed inflammatory changes of gallbladder is consistent with acute cholecystitis.  He was initially admitted to ICU under PCCM service for septic shock secondary to acute cholecystitis and started on pressors and IV antibiotics.  General surgery was consulted.  His troponins trended up to 2264.  Cardiology was consulted.  He was treated with 48 hours of IV heparin .  He was also found to have AKI: Nephrology was consulted.  He was transferred to TR[facility] service from 07/17/2023 onwards.  Assessment & Plan:   Epic shock: Present on admission E. coli bacteremia Acute on chronic calculus cholecystitis -Shock has resolved.  Pressors have been weaned off.  Transferred to TR[facility] service from 07/17/2023 onwards -HIDA scan negative for obstruction: General Surgery recommends no need for any percutaneous cholecystostomy tube placement at this time.  LFTs improving.  Abdominal exam is improving -Continue IV Ancef . Repeat blood cultures have been negative so far  Acute on chronic systolic heart failure Permanent A-fib: Not on anticoagulation due to history of GI bleeding/duodenal ulcer perforation CAD with history of CABG and aortic stenosis status post AVR Demand ischemia/troponin elevation Hyperlipidemia -Limited echo showed EF of 35 to 40%.  Troponins elevated up to 2264.  Possibly from demand ischemia.  Treated with IV heparin  for 48 hours and switch to subcutaneous heparin .  Continue aspirin  and metoprolol .  Home statin  on hold because of elevated liver enzymes -Strict input and output.  Daily weights.  Fluid restriction. -GDMT limited by hypotension  AKI on CKD stage IIIb Acute metabolic acidosis -Baseline creatinine of 1.7 from October 2024.  Creatinine peaked to 3.39.  Creatinine 2.47 today.  Nephrology following.  IV fluids as per nephrology recommendations.  Monitor renal function.  Avoid nephrotoxic medications.  Elevated LFTs -Improving.  Monitor  Diabetes mellitus type 2 with hyperglycemia - Continue CBGs with SSI.  Leukocytosis -Resolved  Acute metabolic encephalopathy/delirium Probable dementia -Patient was agitated overnight.  Probably has undiagnosed dementia which has gotten worse in the hospital because of delirium -Received Seroquel  overnight.  Fall precautions.  Monitor mental status.  Continue Seroquel  as needed as needed. -Consult palliative care for goals of care discussion.  DVT prophylaxis:  subcutaneous heparin  Code Status: DNR Family Communication: None at bedside Disposition Plan: Status is: Inpatient Remains inpatient appropriate because: Of severity of illness    Consultants: PCCM/General Surgery/cardiology/nephrology.  Consult palliative care  Procedures: As above  Antimicrobials:  Anti-infectives (From admission, onward)    Start     Dose/Rate Route Frequency Ordered Stop   07/20/23 1000  ceFAZolin  (ANCEF ) IVPB 2g/100 mL premix        2 g 200 mL/hr over 30 Minutes Intravenous Every 12 hours 07/20/23 0908     07/18/23 2200  ceFAZolin  (ANCEF ) IVPB 1 g/50 mL premix  Status:  Discontinued        1 g 100 mL/hr over 30 Minutes Intravenous Every 12 hours 07/18/23 0754 07/20/23 0908   07/18/23 1000  ceFAZolin  (ANCEF ) IVPB 2g/100 mL premix        2 g 200 mL/hr over 30 Minutes Intravenous  Once 07/18/23 0754 07/18/23 1022   07/15/23 1300  cefTRIAXone  (ROCEPHIN ) 2 g in sodium chloride  0.9 % 100 mL IVPB  Status:  Discontinued        2 g 200 mL/hr over 30 Minutes  Intravenous Every 24 hours 07/15/23 1122 07/18/23 0747   07/15/23 1000  linezolid  (ZYVOX ) IVPB 600 mg  Status:  Discontinued        600 mg 300 mL/hr over 60 Minutes Intravenous Every 12 hours 07/15/23 0217 07/15/23 1124   07/15/23 0600  piperacillin -tazobactam (ZOSYN ) IVPB 3.375 g  Status:  Discontinued        3.375 g 12.5 mL/hr over 240 Minutes Intravenous Every 8 hours 07/14/23 2230 07/15/23 1122   07/14/23 2215  metroNIDAZOLE  (FLAGYL ) IVPB 500 mg        500 mg 100 mL/hr over 60 Minutes Intravenous  Once 07/14/23 2208 07/14/23 2336   07/14/23 1945  vancomycin  (VANCOCIN ) IVPB 1000 mg/200 mL premix        1,000 mg 200 mL/hr over 60 Minutes Intravenous STAT 07/14/23 1937 07/14/23 2153   07/14/23 1915  ceFEPIme  (MAXIPIME ) 2 g in sodium chloride  0.9 % 100 mL IVPB        2 g 200 mL/hr over 30 Minutes Intravenous  Once 07/14/23 1906 07/14/23 2013        Subjective: Patient seen and examined at bedside.  Awake, slow to respond, confused.  Nursing staff reports increased agitation overnight.  No fever, seizures or vomiting reported  Objective: Vitals:   07/20/23 0736 07/20/23 0800 07/20/23 0900 07/20/23 1001  BP:  108/63 135/71 138/63  Pulse:  (!) 55 74 80  Resp:  13 (!) 23   Temp: (!) 97.3 F (36.3 C)     TempSrc: Oral     SpO2:  96% 98%   Weight:      Height:        Intake/Output Summary (Last 24 hours) at 07/20/2023 1050 Last data filed at 07/20/2023 0849 Gross per 24 hour  Intake 995.75 ml  Output 2000 ml  Net -1004.25 ml   Filed Weights   07/19/23 0500 07/19/23 1505 07/20/23 0445  Weight: 62.5 kg 64.8 kg 61.2 kg    Examination:  General exam: Appears calm and comfortable.  Looks chronically ill and deconditioned.  On room air. Respiratory system: Bilateral decreased breath sounds at bases with scattered crackles Cardiovascular system: S1 & S2 heard, Rate controlled Gastrointestinal system: Abdomen is nondistended, soft and mildly tender.  Normal bowel sounds  heard. Extremities: No cyanosis, clubbing; trace lower extremity edema Central nervous system: Awake, slow respond, poor historian.  Confused.  No focal neurological deficits. Moving extremities Skin: No rashes, lesions or ulcers Psychiatry: Flat affect.  Not agitated.   Data Reviewed: I have personally reviewed following labs and imaging studies  CBC: Recent Labs  Lab 07/14/23 1847 07/15/23 0257 07/16/23 0307 07/17/23 0259 07/18/23 0258 07/19/23 0255  WBC 24.3* 27.0* 18.3* 13.9* 13.6* 10.1  NEUTROABS 21.7*  --   --   --   --   --   HGB 10.1* 9.3* 8.6* 8.0* 9.6* 8.3*  HCT 29.2* 29.2* 25.6* 23.4* 28.2* 22.9*  MCV 86.4 89.6 85.6 83.3 84.4 80.1  PLT 263 284 235 243 286 272   Basic Metabolic Panel: Recent Labs  Lab 07/15/23 0257 07/15/23 0259 07/15/23 1148 07/16/23 0801 07/17/23 0259 07/18/23 0258 07/19/23 0255 07/20/23 0252  NA 130*  --    < > 132* 132* 133* 129* 136  K 4.6  4.8   < > 4.4 4.3 4.8 4.7 4.4  CL 99  --    < > 100 100 100 98 105  CO2 15*  --    < > 15* 16* 15* 17* 18*  GLUCOSE 157*  --    < > 112* 132* 135* 118* 119*  BUN 50*  --    < > 64* 71* 79* 80* 70*  CREATININE 1.82*  --    < > 2.98* 3.17* 3.39* 3.18* 2.47*  CALCIUM  8.9  --    < > 8.7* 8.7* 9.0 8.7* 8.7*  MG 1.4* 1.4*  --  2.4  --   --   --   --   PHOS 3.7 3.6  --   --   --   --   --   --    < > = values in this interval not displayed.   GFR: Estimated Creatinine Clearance: 21.3 mL/min (A) (by C-G formula based on SCr of 2.47 mg/dL (H)). Liver Function Tests: Recent Labs  Lab 07/16/23 0307 07/17/23 0259 07/18/23 0258 07/19/23 0255 07/20/23 0252  AST 97* 64* 772* 715* 231*  ALT 109* 87* 394* 372* 123*  ALKPHOS 131* 142* 166* 131* 113  BILITOT 1.5* 1.4* 1.4* 1.7* 1.3*  PROT 5.5* 5.5* 6.6 5.9* 5.1*  ALBUMIN 2.5* 2.3* 2.8* 2.7* 2.4*   Recent Labs  Lab 07/14/23 1847 07/15/23 0257  LIPASE 43 133*   No results for input(s): AMMONIA in the last 168 hours. Coagulation Profile: Recent  Labs  Lab 07/14/23 2351 07/15/23 0257  INR 1.5* 1.5*   Cardiac Enzymes: No results for input(s): CKTOTAL, CKMB, CKMBINDEX, TROPONINI in the last 168 hours. BNP (last 3 results) Recent Labs    04/20/23 1126  PROBNP 7,080*   HbA1C: No results for input(s): HGBA1C in the last 72 hours. CBG: Recent Labs  Lab 07/19/23 1511 07/19/23 1938 07/19/23 2331 07/20/23 0311 07/20/23 0723  GLUCAP 240* 300* 142* 133* 104*   Lipid Profile: Recent Labs    07/18/23 0258  CHOL 97  HDL <10*  LDLCALC NOT CALCULATED  TRIG 203*  CHOLHDL NOT CALCULATED   Thyroid  Function Tests: No results for input(s): TSH, T4TOTAL, FREET4, T3FREE, THYROIDAB in the last 72 hours. Anemia Panel: No results for input(s): VITAMINB12, FOLATE, FERRITIN, TIBC, IRON, RETICCTPCT in the last 72 hours. Sepsis Labs: Recent Labs  Lab 07/14/23 2005 07/15/23 0257 07/15/23 0452 07/15/23 1148 07/16/23 0801  PROCALCITON  --  3.86  --   --   --   LATICACIDVEN 5.6*  --  3.7* 4.4* 1.6    Recent Results (from the past 240 hours)  Blood culture (routine x 2)     Status: Abnormal   Collection Time: 07/14/23  6:47 PM   Specimen: BLOOD  Result Value Ref Range Status   Specimen Description   Final    BLOOD LEFT ANTECUBITAL Performed at [facility], [address], [place], [place] [address]    Special Requests   Final    BOTTLES DRAWN AEROBIC AND ANAEROBIC Blood Culture adequate volume Performed at [facility], [address], [place], [place] [address]    Culture  Setup Time   Final    GRAM NEGATIVE RODS IN BOTH AEROBIC AND ANAEROBIC BOTTLES CRITICAL RESULT CALLED TO, READ BACK BY AND VERIFIED WITH: [facility] SWAYNE [id number] AT 1105 BY EC Performed at [facility], [address] [place], [place] [address]    Culture ESCHERICHIA COLI (  A)  Final   Report Status 07/17/2023 FINAL  Final   Organism ID, Bacteria ESCHERICHIA COLI  Final    Organism ID, Bacteria ESCHERICHIA COLI  Final      Susceptibility   Escherichia coli - KIRBY BAUER*    CEFAZOLIN  SENSITIVE Sensitive    Escherichia coli - MIC*    AMPICILLIN  <=2 SENSITIVE Sensitive     CEFEPIME  <=0.12 SENSITIVE Sensitive     CEFTAZIDIME <=1 SENSITIVE Sensitive     CEFTRIAXONE  <=0.25 SENSITIVE Sensitive     CIPROFLOXACIN <=0.25 SENSITIVE Sensitive     GENTAMICIN <=1 SENSITIVE Sensitive     IMIPENEM <=0.25 SENSITIVE Sensitive     TRIMETH/SULFA <=20 SENSITIVE Sensitive     AMPICILLIN /SULBACTAM <=2 SENSITIVE Sensitive     PIP/TAZO <=4 SENSITIVE Sensitive ug/mL    * ESCHERICHIA COLI    ESCHERICHIA COLI  Blood Culture ID Panel (Reflexed)     Status: Abnormal   Collection Time: 07/14/23  6:47 PM  Result Value Ref Range Status   Enterococcus faecalis NOT DETECTED NOT DETECTED Final   Enterococcus Faecium NOT DETECTED NOT DETECTED Final   Listeria monocytogenes NOT DETECTED NOT DETECTED Final   Staphylococcus species NOT DETECTED NOT DETECTED Final   Staphylococcus aureus (BCID) NOT DETECTED NOT DETECTED Final   Staphylococcus epidermidis NOT DETECTED NOT DETECTED Final   Staphylococcus lugdunensis NOT DETECTED NOT DETECTED Final   Streptococcus species NOT DETECTED NOT DETECTED Final   Streptococcus agalactiae NOT DETECTED NOT DETECTED Final   Streptococcus pneumoniae NOT DETECTED NOT DETECTED Final   Streptococcus pyogenes NOT DETECTED NOT DETECTED Final   A.calcoaceticus-baumannii NOT DETECTED NOT DETECTED Final   Bacteroides fragilis NOT DETECTED NOT DETECTED Final   Enterobacterales DETECTED (A) NOT DETECTED Final    Comment: Enterobacterales represent a large order of gram negative bacteria, not a single organism. CRITICAL RESULT CALLED TO, READ BACK BY AND VERIFIED WITH: PHARMD MARY SWAYNE [id number] AT 1105 BY EC    Enterobacter cloacae complex NOT DETECTED NOT DETECTED Final   Escherichia coli DETECTED (A) NOT DETECTED Final    Comment: CRITICAL RESULT CALLED  TO, READ BACK BY AND VERIFIED WITH: PHARMD MARY SWAYNE [id number] AT 1105 BY EC    Klebsiella aerogenes NOT DETECTED NOT DETECTED Final   Klebsiella oxytoca NOT DETECTED NOT DETECTED Final   Klebsiella pneumoniae NOT DETECTED NOT DETECTED Final   Proteus species NOT DETECTED NOT DETECTED Final   Salmonella species NOT DETECTED NOT DETECTED Final   Serratia marcescens NOT DETECTED NOT DETECTED Final   Haemophilus influenzae NOT DETECTED NOT DETECTED Final   Neisseria meningitidis NOT DETECTED NOT DETECTED Final   Pseudomonas aeruginosa NOT DETECTED NOT DETECTED Final   Stenotrophomonas maltophilia NOT DETECTED NOT DETECTED Final   Candida albicans NOT DETECTED NOT DETECTED Final   Candida auris NOT DETECTED NOT DETECTED Final   Candida glabrata NOT DETECTED NOT DETECTED Final   Candida krusei NOT DETECTED NOT DETECTED Final   Candida parapsilosis NOT DETECTED NOT DETECTED Final   Candida tropicalis NOT DETECTED NOT DETECTED Final   Cryptococcus neoformans/gattii NOT DETECTED NOT DETECTED Final   CTX-M ESBL NOT DETECTED NOT DETECTED Final   Carbapenem resistance IMP NOT DETECTED NOT DETECTED Final   Carbapenem resistance KPC NOT DETECTED NOT DETECTED Final   Carbapenem resistance NDM NOT DETECTED NOT DETECTED Final   Carbapenem resist OXA 48 LIKE NOT DETECTED NOT DETECTED Final   Carbapenem resistance VIM NOT DETECTED NOT DETECTED Final    Comment: Performed at [facility], [address], [place], [place] [address]  Resp panel by RT-PCR (RSV, Flu A&B, Covid) Anterior Nasal Swab     Status: None   Collection Time: 07/14/23  7:01 PM   Specimen: Anterior Nasal Swab  Result Value Ref Range Status   SARS Coronavirus 2 by RT PCR NEGATIVE NEGATIVE Final    Comment: (NOTE) SARS-CoV-2 target nucleic acids are NOT DETECTED.  The SARS-CoV-2 RNA is generally detectable in upper respiratory specimens during the acute phase of infection. The lowest concentration of SARS-CoV-2 viral copies  this assay can detect is 138 copies/mL. A negative result does not preclude SARS-Cov-2 infection and should not be used as the sole basis for treatment or other patient management decisions. A negative result may occur with  improper specimen collection/handling, submission of specimen other than nasopharyngeal swab, presence of viral mutation(s) within the areas targeted by this assay, and inadequate number of viral copies(<138 copies/mL). A negative result must be combined with clinical observations, patient history, and epidemiological information. The expected result is Negative.  Fact Sheet for Patients:  bloggercourse.com  Fact Sheet for Healthcare Providers:  seriousbroker.it  This test is no t yet approved or cleared by the [place]  FDA and  has been authorized for detection and/or diagnosis of SARS-CoV-2 by FDA under an Emergency Use Authorization (EUA). This EUA will remain  in effect (meaning this test can be used) for the duration of the COVID-19 declaration under Section 564(b)(1) of the Act, 21 U.S.C.section 360bbb-3(b)(1), unless the authorization is terminated  or revoked sooner.       Influenza A by PCR NEGATIVE NEGATIVE Final   Influenza B by PCR NEGATIVE NEGATIVE Final    Comment: (NOTE) The Xpert Xpress SARS-CoV-2/FLU/RSV plus assay is intended as an aid in the diagnosis of influenza from Nasopharyngeal swab specimens and should not be used as a sole basis for treatment. Nasal washings and aspirates are unacceptable for Xpert Xpress SARS-CoV-2/FLU/RSV testing.  Fact Sheet for Patients: bloggercourse.com  Fact Sheet for Healthcare Providers: seriousbroker.it  This test is not yet approved or cleared by the [place]  FDA and has been authorized for detection and/or diagnosis of SARS-CoV-2 by FDA under an Emergency Use Authorization (EUA). This EUA  will remain in effect (meaning this test can be used) for the duration of the COVID-19 declaration under Section 564(b)(1) of the Act, 21 U.S.C. section 360bbb-3(b)(1), unless the authorization is terminated or revoked.     Resp Syncytial Virus by PCR NEGATIVE NEGATIVE Final    Comment: (NOTE) Fact Sheet for Patients: bloggercourse.com  Fact Sheet for Healthcare Providers: seriousbroker.it  This test is not yet approved or cleared by the [place]  FDA and has been authorized for detection and/or diagnosis of SARS-CoV-2 by FDA under an Emergency Use Authorization (EUA). This EUA will remain in effect (meaning this test can be used) for the duration of the COVID-19 declaration under Section 564(b)(1) of the Act, 21 U.S.C. section 360bbb-3(b)(1), unless the authorization is terminated or revoked.  Performed at [facility], [address], [place], [place] [address]   MRSA Next Gen by PCR, Nasal     Status: None   Collection Time: 07/15/23  2:09 AM   Specimen: Nasal Mucosa; Nasal Swab  Result Value Ref Range Status   MRSA by PCR Next Gen NOT DETECTED NOT DETECTED Final    Comment: (NOTE) The GeneXpert MRSA Assay (FDA approved for NASAL specimens only), is one component of a comprehensive MRSA colonization surveillance program.  It is not intended to diagnose MRSA infection nor to guide or monitor treatment for MRSA infections. Test performance is not FDA approved in patients less than 27 years old. Performed at [facility], [address], [place], [place] [address]   Blood culture (routine x 2)     Status: None   Collection Time: 07/15/23  2:57 AM   Specimen: BLOOD  Result Value Ref Range Status   Specimen Description   Final    BLOOD BLOOD RIGHT HAND AEROBIC BOTTLE ONLY Performed at [facility], [address] [place], [place] [address]    Special Requests    Final    BOTTLES DRAWN AEROBIC ONLY Blood Culture results may not be optimal due to an inadequate volume of blood received in culture bottles Performed at [facility], [address], [place], [place] [address]    Culture   Final    NO GROWTH 5 DAYS Performed at [facility], [address] [place], [place] [address]    Report Status 07/20/2023 FINAL  Final  Culture, blood (Routine X 2) w Reflex to ID Panel     Status: None (Preliminary result)   Collection Time: 07/17/23  2:59 AM   Specimen: BLOOD RIGHT HAND  Result Value Ref Range Status   Specimen Description   Final    BLOOD RIGHT HAND Performed at [facility], [address], [place], [place] [address]    Special Requests   Final    BOTTLES DRAWN AEROBIC ONLY Blood Culture results may not be optimal due to an inadequate volume of blood received in culture bottles Performed at [facility], [address] [place], [place] [address]    Culture   Final    NO GROWTH 3 DAYS Performed at [facility], [address] [place], [place] [address]    Report Status PENDING  Incomplete  Culture, blood (Routine X 2) w Reflex to ID Panel     Status: None (Preliminary result)   Collection Time: 07/17/23  2:59 AM   Specimen: BLOOD RIGHT HAND  Result Value Ref Range Status   Specimen Description   Final    BLOOD RIGHT HAND Performed at [facility], [address] [place], [place] [address]    Special Requests   Final    BOTTLES DRAWN AEROBIC ONLY Blood Culture results may not be optimal due to an inadequate volume of blood received in culture bottles Performed at [facility], [address], [place], [place] [address]    Culture   Final    NO GROWTH 3 DAYS Performed at [facility], [address], [place], [place] [address]    Report Status PENDING  Incomplete         Radiology Studies: No results found.      Scheduled Meds:   aspirin  EC  81 mg Oral Daily   Chlorhexidine  Gluconate Cloth  6 each Topical Daily   heparin  injection (subcutaneous)  5,000 Units Subcutaneous Q8H   insulin  aspart  0-9 Units Subcutaneous Q4H   metoprolol  succinate  100 mg Oral Daily   polycarbophil  625 mg Oral BID   Continuous Infusions:   ceFAZolin  (ANCEF ) IV 2 g (07/20/23 0956)   sodium bicarbonate  150 mEq in sterile water  1,150 mL infusion 65 mL/hr at 07/20/23 0704          Sophie Mao, MD [facility] 07/20/2023, 10:50  AM

## 2023-07-20 NOTE — Progress Notes (Signed)
 [facility] Progress Note  Subjective: OUP 400 yest and 1800 so far today. Creat down to 2.5.   Vitals:   07/20/23 1100 07/20/23 1123 07/20/23 1300 07/20/23 1534  BP: 121/62  128/61   Pulse: (!) 27  81   Resp: 17  15   Temp:  (!) 97.3 F (36.3 C)  (!) 97.5 F (36.4 C)  TempSrc:  Oral  Axillary  SpO2: 90%  96%   Weight:      Height:        Exam: Gen alert, no distress, elderly No rash, cyanosis or gangrene Sclera anicteric, throat dry No jvd or bruits Chest clear bilat to bases, no rales/ wheezing RRR no MRG Abd soft ntnd no mass or ascites +bs GU condom cath in place MS no joint effusions or deformity Ext no LE or UE edema, no wounds or ulcers Neuro is alert, Ox 3 , nf       Date                             Creat               eGFR (ml/min) 2009- 2019                  1.10- 1.50 2023                            1.04 February 2023                     1.4- 1.5 Aug                              1.28- 2.75 04/20/23                      1.70                 41 ml/min         1/03                             1.96 1/07                             3.39 1/08                             3.18     UA 1/3 - negative   UNa, UCr pending   CT abd/pelv on 1/03 + 75 cc IV contrast --> Adrenals/Urinary Tract: Adrenal glands are within normal limits. Bilateral renal cysts are noted which appear simple in nature and stable from previous exam. No follow-up is recommended. No renal calculi or obstructive changes are seen. The bladder is decompressed.        Assessment/ Plan: AKI on CKD 3b - b/l creat 1.70 from October 2024, eGFR 41 ml/min.  Creat was 1.9 on admission and peaked at 3.3 yest, down to 3.1 today.  This is in the setting of acute shock w/ ecoli sepsis likely related to GB infection. No hydro bilat on CT abdomen. Was exposed to IV contrast w/ CT on 1/03. UA neg. Suspect AKI is related to septic shock on admission+  IV contrast + vol depletion. No vol excess. Started  IVF's at 65 cc/hr. Creat is down to 2.4 today. No signs vol excess. Cont IVF's. F/u labs in am.  Met acidosis - from renal failure, sepsis , shock --> bicarb gtt at 65/hr  HTN - getting toprol  xl , bp's are stable.  Ecoli bacteremia - due to possible chronic acalculous cholecystitis. On IV rocephin .  Dementia           Rob Geralynn MD  [facility] 07/20/2023, 5:53 PM  Recent Labs  Lab 07/15/23 0257 07/15/23 0259 07/15/23 1148 07/18/23 0258 07/19/23 0255 07/20/23 0252  HGB 9.3*  --    < > 9.6* 8.3*  --   ALBUMIN 2.9*  --    < > 2.8* 2.7* 2.4*  CALCIUM  8.9  --    < > 9.0 8.7* 8.7*  PHOS 3.7 3.6  --   --   --   --   CREATININE 1.82*  --    < > 3.39* 3.18* 2.47*  K 4.6 4.8   < > 4.8 4.7 4.4   < > = values in this interval not displayed.   No results for input(s): IRON, TIBC, FERRITIN in the last 168 hours. Inpatient medications:  aspirin  EC  81 mg Oral Daily   Chlorhexidine  Gluconate Cloth  6 each Topical Daily   heparin  injection (subcutaneous)  5,000 Units Subcutaneous Q8H   insulin  aspart  0-9 Units Subcutaneous Q4H   metoprolol  succinate  100 mg Oral Daily   polycarbophil  625 mg Oral BID   QUEtiapine   25 mg Oral QHS     ceFAZolin  (ANCEF ) IV Stopped (07/20/23 1026)   sodium bicarbonate  150 mEq in sterile water  1,150 mL infusion 65 mL/hr at 07/20/23 1628   acetaminophen , acetaminophen , alum & mag hydroxide-simeth, docusate sodium , HYDROmorphone  (DILAUDID ) injection, magic mouthwash, ondansetron  (ZOFRAN ) IV, mouth rinse, polyethylene glycol, prochlorperazine , simethicone , sodium chloride , traMADol 

## 2023-07-20 NOTE — Progress Notes (Signed)
 Pharmacy Antibiotic Note  Nathan [name] is a 78 y.o. male admitted on 07/14/2023 with E.coli bacteremia related to likely intra-abdominal source in the setting of chronic cholecystitis.  Pharmacy has been consulted for Cefazolin  dosing. The patient was noted to have worsening AKI with SCr up to 3.39 (BL 1.5-2), still with charted UOP. Noted to still have some trends towards hypotension however not requiring pressors.  Day 7 total abxs, Day 3 Ancef  Improved SCr, CrCl 21 WBC improved Afebrile  Plan: - Change Ancef  to 2g IV q12 for improved renal function - Will continue to follow renal function, culture results, LOT, and antibiotic de-escalation plans   Height: 5' 7 (170.2 cm) Weight: 61.2 kg (134 lb 14.7 oz) IBW/kg (Calculated) : 66.1  Temp (24hrs), Avg:97.6 F (36.4 C), Min:97.2 F (36.2 C), Max:97.9 F (36.6 C)  Recent Labs  Lab 07/14/23 1914 07/14/23 2005 07/15/23 0257 07/15/23 0452 07/15/23 1148 07/16/23 0307 07/16/23 0801 07/17/23 0259 07/18/23 0258 07/19/23 0255 07/20/23 0252  WBC  --   --  27.0*  --   --  18.3*  --  13.9* 13.6* 10.1  --   CREATININE  --   --  1.82*  --  2.25* 2.56* 2.98* 3.17* 3.39* 3.18* 2.47*  LATICACIDVEN 5.7* 5.6*  --  3.7* 4.4*  --  1.6  --   --   --   --     Estimated Creatinine Clearance: 21.3 mL/min (A) (by C-G formula based on SCr of 2.47 mg/dL (H)).    Allergies  Allergen Reactions   Shellfish-Derived Products Anaphylaxis   Nsaids Other (See Comments)    H/o gastric ulcers = NO NSAIDs per GI   Zolpidem Tartrate Other (See Comments)    Hallucinations    Antimicrobials this admission: Cefepime  1/3 x 1 Vancomycin  1/3 x 1 Flagyl  1/3 x 1 Zosyn  1/4 x 1 Linezolid  1/4 x 1 Ceftriaxone  1/4 >> 1/6 Cefazolin  1/7 >>  Dose adjustments this admission:   Microbiology results: 1/3 BCx >> 2/2 E.coli (pan-sensitive) 1/4 BCx >> ngx2d 1/6 BCx >> ngtd  Thank you for allowing pharmacy to be a part of this patient's care.  Eva CHRISTELLA Allis,  PharmD, BCPS Secure Chat if ?s 07/20/2023 9:06 AM

## 2023-07-21 DIAGNOSIS — Z7189 Other specified counseling: Secondary | ICD-10-CM

## 2023-07-21 DIAGNOSIS — R531 Weakness: Secondary | ICD-10-CM | POA: Diagnosis not present

## 2023-07-21 DIAGNOSIS — Z515 Encounter for palliative care: Secondary | ICD-10-CM

## 2023-07-21 DIAGNOSIS — A4151 Sepsis due to Escherichia coli [E. coli]: Secondary | ICD-10-CM | POA: Diagnosis not present

## 2023-07-21 DIAGNOSIS — N170 Acute kidney failure with tubular necrosis: Secondary | ICD-10-CM | POA: Diagnosis not present

## 2023-07-21 DIAGNOSIS — R6521 Severe sepsis with septic shock: Secondary | ICD-10-CM | POA: Diagnosis not present

## 2023-07-21 LAB — GLUCOSE, CAPILLARY
Glucose-Capillary: 114 mg/dL — ABNORMAL HIGH (ref 70–99)
Glucose-Capillary: 147 mg/dL — ABNORMAL HIGH (ref 70–99)
Glucose-Capillary: 187 mg/dL — ABNORMAL HIGH (ref 70–99)
Glucose-Capillary: 224 mg/dL — ABNORMAL HIGH (ref 70–99)
Glucose-Capillary: 262 mg/dL — ABNORMAL HIGH (ref 70–99)
Glucose-Capillary: 99 mg/dL (ref 70–99)

## 2023-07-21 LAB — CBC WITH DIFFERENTIAL/PLATELET
Abs Immature Granulocytes: 0.27 10*3/uL — ABNORMAL HIGH (ref 0.00–0.07)
Basophils Absolute: 0 10*3/uL (ref 0.0–0.1)
Basophils Relative: 0 %
Eosinophils Absolute: 0.2 10*3/uL (ref 0.0–0.5)
Eosinophils Relative: 2 %
HCT: 24.8 % — ABNORMAL LOW (ref 39.0–52.0)
Hemoglobin: 8.6 g/dL — ABNORMAL LOW (ref 13.0–17.0)
Immature Granulocytes: 3 %
Lymphocytes Relative: 13 %
Lymphs Abs: 1.2 10*3/uL (ref 0.7–4.0)
MCH: 29.1 pg (ref 26.0–34.0)
MCHC: 34.7 g/dL (ref 30.0–36.0)
MCV: 83.8 fL (ref 80.0–100.0)
Monocytes Absolute: 1.1 10*3/uL — ABNORMAL HIGH (ref 0.1–1.0)
Monocytes Relative: 12 %
Neutro Abs: 6.5 10*3/uL (ref 1.7–7.7)
Neutrophils Relative %: 70 %
Platelets: 224 10*3/uL (ref 150–400)
RBC: 2.96 MIL/uL — ABNORMAL LOW (ref 4.22–5.81)
RDW: 17.2 % — ABNORMAL HIGH (ref 11.5–15.5)
WBC: 9.2 10*3/uL (ref 4.0–10.5)
nRBC: 0.2 % (ref 0.0–0.2)

## 2023-07-21 LAB — COMPREHENSIVE METABOLIC PANEL
ALT: 66 U/L — ABNORMAL HIGH (ref 0–44)
AST: 202 U/L — ABNORMAL HIGH (ref 15–41)
Albumin: 2.6 g/dL — ABNORMAL LOW (ref 3.5–5.0)
Alkaline Phosphatase: 104 U/L (ref 38–126)
Anion gap: 11 (ref 5–15)
BUN: 63 mg/dL — ABNORMAL HIGH (ref 8–23)
CO2: 26 mmol/L (ref 22–32)
Calcium: 8.6 mg/dL — ABNORMAL LOW (ref 8.9–10.3)
Chloride: 100 mmol/L (ref 98–111)
Creatinine, Ser: 2.24 mg/dL — ABNORMAL HIGH (ref 0.61–1.24)
GFR, Estimated: 29 mL/min — ABNORMAL LOW (ref >60)
Glucose, Bld: 109 mg/dL — ABNORMAL HIGH (ref 70–99)
Potassium: 3.5 mmol/L (ref 3.5–5.1)
Sodium: 137 mmol/L (ref 135–145)
Total Bilirubin: 1.3 mg/dL — ABNORMAL HIGH (ref 0.0–1.2)
Total Protein: 5.4 g/dL — ABNORMAL LOW (ref 6.5–8.1)

## 2023-07-21 LAB — MAGNESIUM: Magnesium: 1.8 mg/dL (ref 1.7–2.4)

## 2023-07-21 MED ORDER — HALOPERIDOL LACTATE 5 MG/ML IJ SOLN
1.0000 mg | Freq: Four times a day (QID) | INTRAMUSCULAR | Status: DC | PRN
  Administered 2023-07-21: 2 mg via INTRAVENOUS
  Filled 2023-07-21: qty 1

## 2023-07-21 MED ORDER — HALOPERIDOL LACTATE 5 MG/ML IJ SOLN: 2.0000 mg | Freq: Once | INTRAMUSCULAR | Status: DC

## 2023-07-21 NOTE — TOC Initial Note (Signed)
 Transition of Care [facility]) - Initial/Assessment Note    Patient Details  Name: Nathan [name] MRN: [id number] Date of Birth: Mar 15, 1946  [facility] CM/SW Contact:    Sonda Manuella Quill, RN Phone Number: 07/21/2023, 2:36 PM  Clinical Narrative:                 Pt disoriented; spoke w/ spouse Nathan [name] [phone]) at bedside; she says pt lives at home; she plans for him to return at d/c; she verified pt has PCP/insurance; she denies pt experiencing SDOH risks; she has not yet determined pt's transport; pt has cane and walker; he does not have HH services or home oxygen ; TOC will follow.  Expected Discharge Plan: Home/Self Care Barriers to Discharge: Continued Medical Work up   Patient Goals and CMS Choice Patient states their goals for this hospitalization and ongoing recovery are:: patient's wife  says he will return home CMS Medicare.gov Compare Post Acute Care list provided to:: Patient Represenative (must comment) Versa [name] (wife))        Expected Discharge Plan and Services   Discharge Planning Services: CM Consult   Living arrangements for the past 2 months: Single Family Home                                      Prior Living Arrangements/Services Living arrangements for the past 2 months: Single Family Home Lives with:: Spouse Patient language and need for interpreter reviewed:: Yes Do you feel safe going back to the place where you live?: Yes      Need for Family Participation in Patient Care: Yes (Comment) Care giver support system in place?: Yes (comment) Current home services: DME (cane, walker) Criminal Activity/Legal Involvement Pertinent to Current Situation/Hospitalization: No - Comment as needed  Activities of Daily Living      Permission Sought/Granted Permission sought to share information with : Case Manager Permission granted to share information with : Yes, Verbal Permission Granted  Share Information with NAME:  Case Manager     Permission granted to share info w Relationship: Nathan [name] (spouse) [phone]     Emotional Assessment Appearance:: Appears stated age Attitude/Demeanor/Rapport: Gracious Affect (typically observed): Accepting Orientation: : Oriented to Self Alcohol  / Substance Use: Not Applicable Psych Involvement: No (comment)  Admission diagnosis:  Sepsis (HCC) [A41.9] Sepsis with acute organ dysfunction and septic shock, due to unspecified organism, unspecified organ dysfunction type (HCC) [A41.9, R65.21] Patient Active Problem List   Diagnosis Date Noted   Chronic calculous cholecystitis 07/16/2023   Atrial fibrillation with RVR (HCC) 07/16/2023   Non-ST elevation (NSTEMI) myocardial infarction (HCC) 07/16/2023   Sepsis (HCC) 07/15/2023   S/P PICC central line placement 04/20/2023   Cholecystitis 04/20/2023   Medication management 04/20/2023   Protein-calorie malnutrition, severe 03/09/2023   Groin hematoma 03/03/2023   Acute cholecystitis 03/02/2023   Paroxysmal atrial fibrillation (HCC) 03/02/2023   Acute posthemorrhagic anemia 03/02/2023   Hypokalemia 03/02/2023   Acute kidney injury on CKD stage 3a, GFR 45-59 ml/min (HCC) 03/01/2023   Acute metabolic encephalopathy 02/28/2023   Myocardial infarction due to demand ischemia (HCC) 02/21/2023   Acute on chronic systolic CHF (congestive heart failure) (HCC) 02/21/2023   Acute respiratory failure with hypoxia (HCC) 02/21/2023   Abdominal pain 01/21/2023   Tachycardia 01/21/2023   Elevated LFTs 01/21/2023   Balance problem 04/26/2022   Neuropathic pain 04/26/2022   Primary open-angle glaucoma, bilateral, mild  stage 08/16/2021   Pseudophakia, both eyes 08/16/2021   Moderate nonproliferative diabetic retinopathy of both eyes without macular edema associated with type 2 diabetes mellitus (HCC) 08/16/2021   Posterior vitreous detachment of both eyes 08/16/2021   DM (diabetes mellitus), secondary, with neurologic  complications (HCC) 06/13/2017   Obesity (BMI 30-39.9) 03/15/2016   Abnormal nuclear stress test 03/01/2016    umbilical hernia repair open with Ventralex mesh Oct 2014 04/10/2013   Other general symptoms  01/06/2011   History of colonic polyps 01/06/2011   Iron deficiency anemia 01/06/2011   Gastritis, chronic 01/06/2011   Benign hypertensive heart disease without heart failure 10/13/2010   Hx of CABG 10/13/2010   Systolic hypertension    Sinus bradycardia    CAD (coronary artery disease)    History of prosthetic aortic valve    History of diabetic retinopathy    History of necrotizing fasciitis    Postoperative anemia    Nightmares    B12 DEFICIENCY 12/02/2009   ESOPHAGEAL REFLUX 11/19/2009   CHEST PAIN 11/19/2009   BLOOD IN STOOL, OCCULT 11/19/2009   ANEMIA-IRON DEFICIENCY 08/19/2008   S/P aortic valve replacement with bioprosthetic valve 08/19/2008   Hemorrhage of gastrointestinal tract 08/19/2008   Type 2 diabetes mellitus with hyperlipidemia (HCC) 08/14/2008   Dyslipidemia 08/14/2008   Anxiety state 08/14/2008   OBSTRUCTIVE SLEEP APNEA 08/14/2008   Unspecified glaucoma 08/14/2008   Essential hypertension 08/14/2008   PHARYNGITIS 08/14/2008   Gastric ulcer 08/14/2008   Gastritis and gastroduodenitis 08/14/2008   Diverticulosis of colon 08/14/2008   PCP:  Yolande Toribio MATSU, MD Pharmacy:   [facility] [address] [place], [place] - [address] [place] [address] Phone: [phone] Fax: [phone]     Social Drivers of Health (SDOH) Social History: SDOH Screenings   Food Insecurity: No Food Insecurity (07/21/2023)  Housing: Low Risk  (07/21/2023)  Transportation Needs: No Transportation Needs (07/21/2023)  Utilities: Not At Risk (07/21/2023)  Depression (PHQ2-9): Low Risk  (04/20/2023)  Social Connections: Moderately Isolated (07/15/2023)  Tobacco Use: Low Risk  (07/14/2023)   SDOH Interventions: Food Insecurity Interventions: Intervention  Not Indicated, Inpatient TOC Housing Interventions: Intervention Not Indicated, Inpatient TOC Transportation Interventions: Intervention Not Indicated, Inpatient TOC Utilities Interventions: Intervention Not Indicated, Inpatient TOC   Readmission Risk Interventions    07/21/2023    2:33 PM 02/22/2023    2:58 PM  Readmission Risk Prevention Plan  Transportation Screening Complete Complete  HRI or Home Care Consult  Complete  Social Work Consult for Recovery Care Planning/Counseling  Complete  Palliative Care Screening  Not Applicable  Medication Review Oceanographer) Complete Referral to Pharmacy  HRI or Home Care Consult Complete   SW Recovery Care/Counseling Consult Complete   Palliative Care Screening Complete   Skilled Nursing Facility Not Applicable Complete

## 2023-07-21 NOTE — Progress Notes (Signed)
 [facility] Progress Note  Subjective: UOP good, creat down to 2.2 today.   Vitals:   07/21/23 1200 07/21/23 1204 07/21/23 1300 07/21/23 1503  BP: (!) 153/63  132/68   Pulse: 76 (!) 50 72   Resp: 11 19 11    Temp:  (!) 97.3 F (36.3 C)  (!) 97.1 F (36.2 C)  TempSrc:  Oral  Oral  SpO2: 99% 100% 96%   Weight:      Height:        Exam: Gen alert, no distress, elderly No rash, cyanosis or gangrene Sclera anicteric, throat dry No jvd or bruits Chest clear bilat to bases, no rales/ wheezing RRR no MRG Abd soft ntnd no mass or ascites +bs GU condom cath in place MS no joint effusions or deformity Ext no LE or UE edema, no wounds or ulcers Neuro is alert, Ox 3 , nf       Date                             Creat               eGFR (ml/min) 2009- 2019                  1.10- 1.50 2023                            1.04 February 2023                     1.4- 1.5 Aug                              1.28- 2.75 04/20/23                      1.70                 41 ml/min         1/03                             1.96 1/07                             3.39 1/08                             3.18     UA 1/3 - negative   UNa, UCr pending   CT abd/pelv on 1/03 + 75 cc IV contrast --> Adrenals/Urinary Tract: Adrenal glands are within normal limits. Bilateral renal cysts are noted which appear simple in nature and stable from previous exam. No follow-up is recommended. No renal calculi or obstructive changes are seen. The bladder is decompressed.        Assessment/ Plan: AKI on CKD 3b - b/l creat 1.70 from October 2024, eGFR 41 ml/min.  Creat was 1.9 on admission and peaked at 3.3 yest, down to 3.1.  This is in the setting of acute shock w/ ecoli sepsis likely related to GB infection. No hydro on CT abdomen. Was exposed to IV contrast w/ CT on 1/03. UA neg. Suspect AKI is related to septic shock + IV contrast + vol depletion. Started IVF's.  Creat improved to 2.4 yesterday and 2.2 today.  Good UOP. No new suggestions, renal fxn improving. Will have the office contact family for a renal f/u in 3-4 weeks.  Met acidosis - from renal failure. Getting bicarb gtt. Pmd to adjust as needed.  HTN - getting toprol  xl , bp's are stable.  Ecoli bacteremia - due to possible chronic acalculous cholecystitis. On IV rocephin .  Dementia           Rob Geralynn MD  [facility] 07/21/2023, 4:39 PM  Recent Labs  Lab 07/15/23 0257 07/15/23 0259 07/15/23 1148 07/19/23 0255 07/20/23 0252 07/21/23 0252  HGB 9.3*  --    < > 8.3*  --  8.6*  ALBUMIN 2.9*  --    < > 2.7* 2.4* 2.6*  CALCIUM  8.9  --    < > 8.7* 8.7* 8.6*  PHOS 3.7 3.6  --   --   --   --   CREATININE 1.82*  --    < > 3.18* 2.47* 2.24*  K 4.6 4.8   < > 4.7 4.4 3.5   < > = values in this interval not displayed.   No results for input(s): IRON, TIBC, FERRITIN in the last 168 hours. Inpatient medications:  aspirin  EC  81 mg Oral Daily   Chlorhexidine  Gluconate Cloth  6 each Topical Daily   haloperidol  lactate  2 mg Intravenous Once   heparin  injection (subcutaneous)  5,000 Units Subcutaneous Q8H   insulin  aspart  0-9 Units Subcutaneous Q4H   metoprolol  succinate  100 mg Oral Daily   polycarbophil  625 mg Oral BID   QUEtiapine   25 mg Oral QHS     ceFAZolin  (ANCEF ) IV Stopped (07/21/23 1015)   sodium bicarbonate  150 mEq in sterile water  1,150 mL infusion 65 mL/hr at 07/21/23 1300   acetaminophen , acetaminophen , alum & mag hydroxide-simeth, docusate sodium , haloperidol  lactate, HYDROmorphone  (DILAUDID ) injection, magic mouthwash, ondansetron  (ZOFRAN ) IV, mouth rinse, polyethylene glycol, prochlorperazine , simethicone , sodium chloride , traMADol 

## 2023-07-21 NOTE — Consult Note (Signed)
 Consultation Note Date: 07/21/2023   Patient Name: Nathan [name]  DOB: 12-24-1945  MRN: [id number]  Age / Sex: 78 y.o., male  PCP: Yolande Toribio MATSU, MD Referring Physician: Cheryle Page, MD  Reason for Consultation: Establishing goals of care  HPI/Patient Profile: 78 y.o. male admitted on 07/14/2023   78 year old gentleman lives at home with his wife, admitted with sepsis status post prior percutaneous cholecystostomy tube for cholecystitis.  Admitted for abdominal pain and sepsis. Clinical Assessment and Goals of Care: Patient remains admitted to hospital medicine service.  General surgery and renal service is also following along.  Patient has serious illness of chronic acalculous cholecystitis.  Hospital course complicated by sepsis acute kidney injury and ongoing delirium.  Patient likely with underlying history of dementia. Palliative consult for ongoing goals of care discussions has been requested. Chart reviewed.  Patient seen.  Call placed and was able to reach wife Ms. [name]. Palliative medicine is specialized medical care for people living with serious illness. It focuses on providing relief from the symptoms and stress of a serious illness. The goal is to improve quality of life for both the patient and the family. Goals of care: Broad aims of medical therapy in relation to the patient's values and preferences. Our aim is to provide medical care aimed at enabling patients to achieve the goals that matter most to them, given the circumstances of their particular medical situation and their constraints.    NEXT OF KIN Spouse [name]  SUMMARY OF RECOMMENDATIONS   Goals of care discussions undertaken with patient's wife.  Overall, she is aware of the serious nature of the patient's current condition.  However, she remains hopeful for some degree of stabilization/recovery.  She states that  the patient has gone to [facility] skilled [facility] facility for rehab in the past.  Prior to this hospitalization, patient was living at home with his wife, they do not have children.  Patient was alternating between using a cane or a walker.  Monitor hospital course and overall disease trajectory of illness.  Agree with Seroquel  and Haldol  for symptom management.  Should the patient medically improved to the point of being considered for skilled nursing facility rehab towards the end of this hospitalization, will recommend addition of outpatient palliative services. Thank you for the consult.  Code Status/Advance Care Planning: DNR   Symptom Management:     Palliative Prophylaxis:  Delirium Protocol    Psycho-social/Spiritual:  Desire for further Chaplaincy support:yes Additional Recommendations: Caregiving  Support/Resources  Prognosis:  Unable to determine  Discharge Planning: To Be Determined      Primary Diagnoses: Present on Admission:  Abdominal pain  Type 2 diabetes mellitus with hyperlipidemia (HCC)  Anxiety state  Balance problem  Tachycardia  Elevated LFTs  Acute on chronic systolic CHF (congestive heart failure) (HCC)  Acute kidney injury on CKD stage 3a, GFR 45-59 ml/min (HCC)  (Resolved) CKD stage 3a, GFR 45-59 ml/min (HCC)  Sepsis (HCC)  Chronic calculous cholecystitis  OBSTRUCTIVE SLEEP APNEA  Essential hypertension  I have reviewed the medical record, interviewed the patient and family, and examined the patient. The following aspects are pertinent.  Past Medical History:  Diagnosis Date   Allergy    Aortic stenosis    Arthritis    Balance problem 04/26/2022   Blood transfusion without reported diagnosis    CAD (coronary artery disease)    Cataract    both eyes surgically removed   Diabetes mellitus, type 2 (HCC)    Diverticulosis of colon (without mention of hemorrhage)    Duodenal ulcer perforation (HCC)    blood transfusion   Flesh-eating  bacteria (HCC) 1995   GERD (gastroesophageal reflux disease)    Glaucoma    pre glaucoma on Xalatan  drops   Heart murmur    History of diabetic retinopathy    History of gastrointestinal bleeding    History of necrotizing fasciitis    History of prosthetic aortic valve    Hypercholesteremia    Myocardial infarction (HCC) 02/23/2016   Neuropathic pain 04/26/2022   Nightmares    CHRONIC   OSA (obstructive sleep apnea)    borderline no CPAP    Personal history of colonic polyps 11/23/2009   TUBULAR ADENOMA   Postoperative anemia    Sinus bradycardia    Sleep apnea    no cpap   Stomach ulcer    Hx of   Systolic hypertension    Vitamin B12 deficiency    Social History   Socioeconomic History   Marital status: Married    Spouse name: Not on file   Number of children: 0   Years of education: Not on file   Highest education level: Not on file  Occupational History   Occupation: Investment Banker, Corporate: [employer]   Occupation: Retired  Tobacco Use   Smoking status: Never   Smokeless tobacco: Never  Vaping Use   Vaping status: Never Used  Substance and Sexual Activity   Alcohol  use: No    Alcohol /week: 0.0 standard drinks of alcohol    Drug use: No   Sexual activity: Not on file  Other Topics Concern   Not on file  Social History Narrative   Not on file   Social Drivers of Health   Financial Resource Strain: Not on file  Food Insecurity: No Food Insecurity (07/15/2023)   Hunger Vital Sign    Worried About Running Out of Food in the Last Year: Never true    Ran Out of Food in the Last Year: Never true  Transportation Needs: No Transportation Needs (07/15/2023)   PRAPARE - Administrator, Civil Service (Medical): No    Lack of Transportation (Non-Medical): No  Physical Activity: Not on file  Stress: Not on file  Social Connections: Moderately Isolated (07/15/2023)   Social Connection and Isolation Panel [NHANES]    Frequency of Communication with Friends  and Family: Never    Frequency of Social Gatherings with Friends and Family: Twice a week    Attends Religious Services: More than 4 times per year    Active Member of [employer] or Organizations: No    Attends Banker Meetings: Never    Marital Status: Married   Family History  Problem Relation Age of Onset   Cirrhosis Father    Alcohol  abuse Father        father murdered   Rectal cancer Mother    Diabetes Mother    Colon cancer Mother 20       rectal cancer  Hypertension Mother    Hypertension Sister    Diabetes Sister    Heart attack Neg Hx    Stroke Neg Hx    Colon polyps Neg Hx    Esophageal cancer Neg Hx    Stomach cancer Neg Hx    Scheduled Meds:  aspirin  EC  81 mg Oral Daily   Chlorhexidine  Gluconate Cloth  6 each Topical Daily   haloperidol  lactate  2 mg Intravenous Once   heparin  injection (subcutaneous)  5,000 Units Subcutaneous Q8H   insulin  aspart  0-9 Units Subcutaneous Q4H   metoprolol  succinate  100 mg Oral Daily   polycarbophil  625 mg Oral BID   QUEtiapine   25 mg Oral QHS   Continuous Infusions:   ceFAZolin  (ANCEF ) IV 2 g (07/21/23 0945)   sodium bicarbonate  150 mEq in sterile water  1,150 mL infusion 65 mL/hr at 07/21/23 0800   PRN Meds:.acetaminophen , acetaminophen , alum & mag hydroxide-simeth, docusate sodium , haloperidol  lactate, HYDROmorphone  (DILAUDID ) injection, magic mouthwash, ondansetron  (ZOFRAN ) IV, mouth rinse, polyethylene glycol, prochlorperazine , simethicone , sodium chloride , traMADol  Medications Prior to Admission:  Prior to Admission medications   Medication Sig Start Date End Date Taking? Authorizing Provider  allopurinol  (ZYLOPRIM ) 300 MG tablet Take 300 mg by mouth daily.   Yes [provider]  aspirin  EC 81 MG tablet Take 81 mg by mouth daily. Swallow whole.   Yes [provider]  atorvastatin  (LIPITOR) 80 MG tablet Take 1 tablet (80 mg total) by mouth daily. Patient taking differently: Take 20 mg by mouth  daily. 03/13/23  Yes Fairy Frames, MD  Coenzyme Q10 (COQ10) 100 MG CAPS Take 100 mg by mouth in the morning and at bedtime.   Yes [provider]  fish oil-omega-3 fatty acids 1000 MG capsule Take 1 g by mouth 2 (two) times daily.   Yes [provider]  hydrochlorothiazide  (MICROZIDE ) 12.5 MG capsule Take 12.5 mg by mouth daily.   Yes [provider]  isosorbide  dinitrate (ISORDIL ) 30 MG tablet Take 30 mg by mouth daily.   Yes [provider]  latanoprost  (XALATAN ) 0.005 % ophthalmic solution Place 1 drop into both eyes at bedtime.   Yes [provider]  metFORMIN (GLUCOPHAGE) 1000 MG tablet Take 1,000 mg by mouth in the morning and at bedtime.   Yes [provider]  metoprolol  succinate (TOPROL -XL) 50 MG 24 hr tablet Take 1 tablet (50 mg total) by mouth daily. Take with or immediately following a meal. Patient taking differently: Take 50 mg by mouth 2 (two) times daily. 03/14/23  Yes Fairy Frames, MD  minoxidil (LONITEN) 10 MG tablet Take 10 mg by mouth daily.  07/20/15  Yes [provider]  nitroGLYCERIN  (NITROSTAT ) 0.4 MG SL tablet Place 1 tablet under the tongue as needed. 09/01/16  Yes [provider]  omeprazole  (PRILOSEC) 40 MG capsule Take 40 mg by mouth 2 (two) times daily.   Yes [provider]  spironolactone  (ALDACTONE ) 25 MG tablet Take 0.5 tablets (12.5 mg total) by mouth daily. 04/21/23  Yes Wyn Jackee VEAR Mickey., NP  empagliflozin  (JARDIANCE ) 10 MG TABS tablet Take 1 tablet (10 mg total) by mouth daily. Patient not taking: Reported on 07/15/2023 04/21/23   Wyn Jackee VEAR Mickey., NP  saccharomyces boulardii (FLORASTOR) 250 MG capsule Take 1 capsule (250 mg total) by mouth 2 (two) times daily. Patient not taking: Reported on 07/15/2023 03/13/23   Fairy Frames, MD   Allergies  Allergen Reactions   Shellfish-Derived Products Anaphylaxis  Nsaids Other (See Comments)    H/o gastric ulcers = NO NSAIDs per GI    Zolpidem Tartrate Other (See Comments)    Hallucinations   Review of Systems Appears confused Physical Exam Elderly gentleman Appears confused Monitor noted Noted to have mild abdominal discomfort  Vital Signs: BP (!) 168/69   Pulse 76   Temp 97.8 F (36.6 C) (Oral)   Resp 16   Ht 5' 7 (1.702 m)   Wt 62 kg   SpO2 96%   BMI 21.41 kg/m  Pain Scale: 0-10   Pain Score: 0-No pain   SpO2: SpO2: 96 % O2 Device:SpO2: 96 % O2 Flow Rate: .O2 Flow Rate (L/min): 2 L/min  IO: Intake/output summary:  Intake/Output Summary (Last 24 hours) at 07/21/2023 1034 Last data filed at 07/21/2023 0800 Gross per 24 hour  Intake 1806.33 ml  Output 1250 ml  Net 556.33 ml    LBM: Last BM Date : 07/21/23 Baseline Weight: Weight: 54.4 kg Most recent weight: Weight: 62 kg     Palliative Assessment/Data:   PPS 40%  Time In:  9.30 Time Out:  10.30 Time Total:  60 Greater than 50%  of this time was spent counseling and coordinating care related to the above assessment and plan.  Signed by: Lonia Serve, MD   Please contact Palliative Medicine Team phone at [phone] for questions and concerns.  For individual provider: See Tracey

## 2023-07-21 NOTE — Progress Notes (Signed)
 PROGRESS NOTE    Nathan [name]  FMW:[id number] DOB: [birth year] DOA: 07/14/2023 PCP: Yolande Toribio MATSU, MD   Brief Narrative:  78 year old male with history of cholecystitis (had percutaneous cholecystectomy drain placed on 03/02/2023), diabetes mellitus type 2, hyperlipidemia, hypertension, paroxysmal A-fib not on anticoagulation due to history of bleeding, chronic systolic CHF, CAD status post CABG and AVR, CKD stage IIIa, OSA presented with worsening abdominal pain.  CT of abdomen and pelvis showed inflammatory changes of gallbladder is consistent with acute cholecystitis.  He was initially admitted to ICU under PCCM service for septic shock secondary to acute cholecystitis and started on pressors and IV antibiotics.  General surgery was consulted.  His troponins trended up to 2264.  Cardiology was consulted.  He was treated with 48 hours of IV heparin .  He was also found to have AKI: Nephrology was consulted.  He was transferred to TRH service from 07/17/2023 onwards.  Assessment & Plan:   Septic shock: Present on admission E. coli bacteremia Acute on chronic calculus cholecystitis -Shock has resolved.  Pressors have been weaned off.  Transferred to TRH service from 07/17/2023 onwards -HIDA scan negative for obstruction: General Surgery recommends no need for any percutaneous cholecystostomy tube placement at this time.  LFTs improving.  Abdominal exam is improving -Continue IV Ancef . Repeat blood cultures have been negative so far  Acute on chronic systolic heart failure Permanent A-fib: Not on anticoagulation due to history of GI bleeding/duodenal ulcer perforation CAD with history of CABG and aortic stenosis status post AVR Demand ischemia/troponin elevation Hyperlipidemia -Limited echo showed EF of 35 to 40%.  Troponins elevated up to 2264.  Possibly from demand ischemia.  Treated with IV heparin  for 48 hours and switched to subcutaneous heparin .  Continue aspirin  and metoprolol .  Home  statin on hold because of elevated liver enzymes -Strict input and output.  Daily weights.  Fluid restriction. -GDMT limited by hypotension  AKI on CKD stage IIIb Acute metabolic acidosis -Baseline creatinine of 1.7 from October 2024.  Creatinine peaked to 3.39.  Creatinine 2.24 today.  Nephrology following.  IV fluids as per nephrology recommendations.  Monitor renal function.  Avoid nephrotoxic medications.  Elevated LFTs -Improving.  Monitor  Diabetes mellitus type 2 with hyperglycemia - Continue CBGs with SSI.  Leukocytosis -Resolved  Acute metabolic encephalopathy/delirium Probable dementia -Patient was agitated overnight on 07/19/23.  Probably has undiagnosed dementia which has gotten worse in the hospital because of delirium -Patient has been started on scheduled Seroquel  from 07/20/2023 evening onwards.  Use Haldol  as needed.  Fall precautions.  Monitor mental status.  No consult palliative care for goals of care discussion.  DVT prophylaxis:  subcutaneous heparin  Code Status: DNR Family Communication: None at bedside Disposition Plan: Status is: Inpatient Remains inpatient appropriate because: Of severity of illness    Consultants: PCCM/General Surgery/cardiology/nephrology.  palliative care  Procedures: As above  Antimicrobials:  Anti-infectives (From admission, onward)    Start     Dose/Rate Route Frequency Ordered Stop   07/20/23 1000  ceFAZolin  (ANCEF ) IVPB 2g/100 mL premix        2 g 200 mL/hr over 30 Minutes Intravenous Every 12 hours 07/20/23 0908     07/18/23 2200  ceFAZolin  (ANCEF ) IVPB 1 g/50 mL premix  Status:  Discontinued        1 g 100 mL/hr over 30 Minutes Intravenous Every 12 hours 07/18/23 0754 07/20/23 0908   07/18/23 1000  ceFAZolin  (ANCEF ) IVPB 2g/100 mL premix  2 g 200 mL/hr over 30 Minutes Intravenous  Once 07/18/23 0754 07/18/23 1022   07/15/23 1300  cefTRIAXone  (ROCEPHIN ) 2 g in sodium chloride  0.9 % 100 mL IVPB  Status:  Discontinued         2 g 200 mL/hr over 30 Minutes Intravenous Every 24 hours 07/15/23 1122 07/18/23 0747   07/15/23 1000  linezolid  (ZYVOX ) IVPB 600 mg  Status:  Discontinued        600 mg 300 mL/hr over 60 Minutes Intravenous Every 12 hours 07/15/23 0217 07/15/23 1124   07/15/23 0600  piperacillin -tazobactam (ZOSYN ) IVPB 3.375 g  Status:  Discontinued        3.375 g 12.5 mL/hr over 240 Minutes Intravenous Every 8 hours 07/14/23 2230 07/15/23 1122   07/14/23 2215  metroNIDAZOLE  (FLAGYL ) IVPB 500 mg        500 mg 100 mL/hr over 60 Minutes Intravenous  Once 07/14/23 2208 07/14/23 2336   07/14/23 1945  vancomycin  (VANCOCIN ) IVPB 1000 mg/200 mL premix        1,000 mg 200 mL/hr over 60 Minutes Intravenous STAT 07/14/23 1937 07/14/23 2153   07/14/23 1915  ceFEPIme  (MAXIPIME ) 2 g in sodium chloride  0.9 % 100 mL IVPB        2 g 200 mL/hr over 30 Minutes Intravenous  Once 07/14/23 1906 07/14/23 2013        Subjective: Patient seen and examined at bedside.  Wakes up only slightly, remains confused.  No seizures, vomiting, fever reported.. Objective: Vitals:   07/21/23 0200 07/21/23 0300 07/21/23 0400 07/21/23 0703  BP: 124/62 105/77    Pulse: (!) 51 (!) 42    Resp: 12 (!) 21    Temp:   97.8 F (36.6 C)   TempSrc:   Oral   SpO2: 100% 96%    Weight:    62 kg  Height:        Intake/Output Summary (Last 24 hours) at 07/21/2023 0718 Last data filed at 07/21/2023 0200 Gross per 24 hour  Intake 1418.07 ml  Output 2150 ml  Net -731.93 ml   Filed Weights   07/19/23 1505 07/20/23 0445 07/21/23 0703  Weight: 64.8 kg 61.2 kg 62 kg    Examination:  General: Currently on room air.  No distress.  Chronically ill and deconditioned looking. ENT/neck: No thyromegaly.  JVD is not elevated  respiratory: Decreased breath sounds at bases bilaterally with some crackles; no wheezing  CVS: S1-S2 heard, rate controlled currently Abdominal: Soft, slightly tender, slightly distended; no organomegaly, bowel  sounds are heard Extremities: Mild lower extremity edema; no cyanosis  CNS:  Wakes up only slightly, remains confused.  Extremely slow to respond.  Poor historian.  No focal neurologic deficit.  Moves extremities Lymph: No obvious lymphadenopathy Skin: No obvious ecchymosis/lesions  psych: Currently not agitated.  Affect is flat. musculoskeletal: No obvious joint swelling/deformity    Data Reviewed: I have personally reviewed following labs and imaging studies  CBC: Recent Labs  Lab 07/14/23 1847 07/15/23 0257 07/16/23 0307 07/17/23 0259 07/18/23 0258 07/19/23 0255 07/21/23 0252  WBC 24.3*   < > 18.3* 13.9* 13.6* 10.1 9.2  NEUTROABS 21.7*  --   --   --   --   --  6.5  HGB 10.1*   < > 8.6* 8.0* 9.6* 8.3* 8.6*  HCT 29.2*   < > 25.6* 23.4* 28.2* 22.9* 24.8*  MCV 86.4   < > 85.6 83.3 84.4 80.1 83.8  PLT 263   < >  235 243 286 272 224   < > = values in this interval not displayed.   Basic Metabolic Panel: Recent Labs  Lab 07/15/23 0257 07/15/23 0259 07/15/23 1148 07/16/23 0801 07/17/23 0259 07/18/23 0258 07/19/23 0255 07/20/23 0252 07/21/23 0252  NA 130*  --    < > 132* 132* 133* 129* 136 137  K 4.6 4.8   < > 4.4 4.3 4.8 4.7 4.4 3.5  CL 99  --    < > 100 100 100 98 105 100  CO2 15*  --    < > 15* 16* 15* 17* 18* 26  GLUCOSE 157*  --    < > 112* 132* 135* 118* 119* 109*  BUN 50*  --    < > 64* 71* 79* 80* 70* 63*  CREATININE 1.82*  --    < > 2.98* 3.17* 3.39* 3.18* 2.47* 2.24*  CALCIUM  8.9  --    < > 8.7* 8.7* 9.0 8.7* 8.7* 8.6*  MG 1.4* 1.4*  --  2.4  --   --   --   --  1.8  PHOS 3.7 3.6  --   --   --   --   --   --   --    < > = values in this interval not displayed.   GFR: Estimated Creatinine Clearance: 23.8 mL/min (A) (by C-G formula based on SCr of 2.24 mg/dL (H)). Liver Function Tests: Recent Labs  Lab 07/17/23 0259 07/18/23 0258 07/19/23 0255 07/20/23 0252 07/21/23 0252  AST 64* 772* 715* 231* 202*  ALT 87* 394* 372* 123* 66*  ALKPHOS 142* 166* 131*  113 104  BILITOT 1.4* 1.4* 1.7* 1.3* 1.3*  PROT 5.5* 6.6 5.9* 5.1* 5.4*  ALBUMIN 2.3* 2.8* 2.7* 2.4* 2.6*   Recent Labs  Lab 07/14/23 1847 07/15/23 0257  LIPASE 43 133*   No results for input(s): AMMONIA in the last 168 hours. Coagulation Profile: Recent Labs  Lab 07/14/23 2351 07/15/23 0257  INR 1.5* 1.5*   Cardiac Enzymes: No results for input(s): CKTOTAL, CKMB, CKMBINDEX, TROPONINI in the last 168 hours. BNP (last 3 results) Recent Labs    04/20/23 1126  PROBNP 7,080*   HbA1C: No results for input(s): HGBA1C in the last 72 hours. CBG: Recent Labs  Lab 07/20/23 1118 07/20/23 1529 07/20/23 1935 07/20/23 2308 07/21/23 0250  GLUCAP 109* 136* 185* 127* 99   Lipid Profile: No results for input(s): CHOL, HDL, LDLCALC, TRIG, CHOLHDL, LDLDIRECT in the last 72 hours.  Thyroid  Function Tests: No results for input(s): TSH, T4TOTAL, FREET4, T3FREE, THYROIDAB in the last 72 hours. Anemia Panel: No results for input(s): VITAMINB12, FOLATE, FERRITIN, TIBC, IRON, RETICCTPCT in the last 72 hours. Sepsis Labs: Recent Labs  Lab 07/14/23 2005 07/15/23 0257 07/15/23 0452 07/15/23 1148 07/16/23 0801  PROCALCITON  --  3.86  --   --   --   LATICACIDVEN 5.6*  --  3.7* 4.4* 1.6    Recent Results (from the past 240 hours)  Blood culture (routine x 2)     Status: Abnormal   Collection Time: 07/14/23  6:47 PM   Specimen: BLOOD  Result Value Ref Range Status   Specimen Description   Final    BLOOD LEFT ANTECUBITAL Performed at [facility], [address] [place], [place] [address]    Special Requests   Final    BOTTLES DRAWN AEROBIC AND ANAEROBIC Blood Culture adequate volume Performed at [facility], [address]  [place], [place] [address]    Culture  Setup Time   Final    GRAM NEGATIVE RODS IN BOTH AEROBIC AND ANAEROBIC BOTTLES CRITICAL RESULT CALLED TO, READ BACK BY AND VERIFIED  WITH: [facility] SWAYNE [id number] AT 1105 BY EC Performed at [facility], [address] [place], [place] [address]    Culture ESCHERICHIA COLI (A)  Final   Report Status 07/17/2023 FINAL  Final   Organism ID, Bacteria ESCHERICHIA COLI  Final   Organism ID, Bacteria ESCHERICHIA COLI  Final      Susceptibility   Escherichia coli - KIRBY BAUER*    CEFAZOLIN  SENSITIVE Sensitive    Escherichia coli - MIC*    AMPICILLIN  <=2 SENSITIVE Sensitive     CEFEPIME  <=0.12 SENSITIVE Sensitive     CEFTAZIDIME <=1 SENSITIVE Sensitive     CEFTRIAXONE  <=0.25 SENSITIVE Sensitive     CIPROFLOXACIN <=0.25 SENSITIVE Sensitive     GENTAMICIN <=1 SENSITIVE Sensitive     IMIPENEM <=0.25 SENSITIVE Sensitive     TRIMETH/SULFA <=20 SENSITIVE Sensitive     AMPICILLIN /SULBACTAM <=2 SENSITIVE Sensitive     PIP/TAZO <=4 SENSITIVE Sensitive ug/mL    * ESCHERICHIA COLI    ESCHERICHIA COLI  Blood Culture ID Panel (Reflexed)     Status: Abnormal   Collection Time: 07/14/23  6:47 PM  Result Value Ref Range Status   Enterococcus faecalis NOT DETECTED NOT DETECTED Final   Enterococcus Faecium NOT DETECTED NOT DETECTED Final   Listeria monocytogenes NOT DETECTED NOT DETECTED Final   Staphylococcus species NOT DETECTED NOT DETECTED Final   Staphylococcus aureus (BCID) NOT DETECTED NOT DETECTED Final   Staphylococcus epidermidis NOT DETECTED NOT DETECTED Final   Staphylococcus lugdunensis NOT DETECTED NOT DETECTED Final   Streptococcus species NOT DETECTED NOT DETECTED Final   Streptococcus agalactiae NOT DETECTED NOT DETECTED Final   Streptococcus pneumoniae NOT DETECTED NOT DETECTED Final   Streptococcus pyogenes NOT DETECTED NOT DETECTED Final   A.calcoaceticus-baumannii NOT DETECTED NOT DETECTED Final   Bacteroides fragilis NOT DETECTED NOT DETECTED Final   Enterobacterales DETECTED (A) NOT DETECTED Final    Comment: Enterobacterales represent a large order of gram negative bacteria, not a single  organism. CRITICAL RESULT CALLED TO, READ BACK BY AND VERIFIED WITH: PHARMD MARY SWAYNE [id number] AT 1105 BY EC    Enterobacter cloacae complex NOT DETECTED NOT DETECTED Final   Escherichia coli DETECTED (A) NOT DETECTED Final    Comment: CRITICAL RESULT CALLED TO, READ BACK BY AND VERIFIED WITH: PHARMD MARY SWAYNE [id number] AT 1105 BY EC    Klebsiella aerogenes NOT DETECTED NOT DETECTED Final   Klebsiella oxytoca NOT DETECTED NOT DETECTED Final   Klebsiella pneumoniae NOT DETECTED NOT DETECTED Final   Proteus species NOT DETECTED NOT DETECTED Final   Salmonella species NOT DETECTED NOT DETECTED Final   Serratia marcescens NOT DETECTED NOT DETECTED Final   Haemophilus influenzae NOT DETECTED NOT DETECTED Final   Neisseria meningitidis NOT DETECTED NOT DETECTED Final   Pseudomonas aeruginosa NOT DETECTED NOT DETECTED Final   Stenotrophomonas maltophilia NOT DETECTED NOT DETECTED Final   Candida albicans NOT DETECTED NOT DETECTED Final   Candida auris NOT DETECTED NOT DETECTED Final   Candida glabrata NOT DETECTED NOT DETECTED Final   Candida krusei NOT DETECTED NOT DETECTED Final   Candida parapsilosis NOT DETECTED NOT DETECTED Final   Candida tropicalis NOT DETECTED NOT DETECTED Final   Cryptococcus neoformans/gattii NOT DETECTED NOT DETECTED Final   CTX-M ESBL NOT DETECTED NOT  DETECTED Final   Carbapenem resistance IMP NOT DETECTED NOT DETECTED Final   Carbapenem resistance KPC NOT DETECTED NOT DETECTED Final   Carbapenem resistance NDM NOT DETECTED NOT DETECTED Final   Carbapenem resist OXA 48 LIKE NOT DETECTED NOT DETECTED Final   Carbapenem resistance VIM NOT DETECTED NOT DETECTED Final    Comment: Performed at [facility], [address] [place], [place] [address]  Resp panel by RT-PCR (RSV, Flu A&B, Covid) Anterior Nasal Swab     Status: None   Collection Time: 07/14/23  7:01 PM   Specimen: Anterior Nasal Swab  Result Value Ref Range Status   SARS Coronavirus 2 by RT  PCR NEGATIVE NEGATIVE Final    Comment: (NOTE) SARS-CoV-2 target nucleic acids are NOT DETECTED.  The SARS-CoV-2 RNA is generally detectable in upper respiratory specimens during the acute phase of infection. The lowest concentration of SARS-CoV-2 viral copies this assay can detect is 138 copies/mL. A negative result does not preclude SARS-Cov-2 infection and should not be used as the sole basis for treatment or other patient management decisions. A negative result may occur with  improper specimen collection/handling, submission of specimen other than nasopharyngeal swab, presence of viral mutation(s) within the areas targeted by this assay, and inadequate number of viral copies(<138 copies/mL). A negative result must be combined with clinical observations, patient history, and epidemiological information. The expected result is Negative.  Fact Sheet for Patients:  bloggercourse.com  Fact Sheet for Healthcare Providers:  seriousbroker.it  This test is no t yet approved or cleared by the [place]  FDA and  has been authorized for detection and/or diagnosis of SARS-CoV-2 by FDA under an Emergency Use Authorization (EUA). This EUA will remain  in effect (meaning this test can be used) for the duration of the COVID-19 declaration under Section 564(b)(1) of the Act, 21 U.S.C.section 360bbb-3(b)(1), unless the authorization is terminated  or revoked sooner.       Influenza A by PCR NEGATIVE NEGATIVE Final   Influenza B by PCR NEGATIVE NEGATIVE Final    Comment: (NOTE) The Xpert Xpress SARS-CoV-2/FLU/RSV plus assay is intended as an aid in the diagnosis of influenza from Nasopharyngeal swab specimens and should not be used as a sole basis for treatment. Nasal washings and aspirates are unacceptable for Xpert Xpress SARS-CoV-2/FLU/RSV testing.  Fact Sheet for Patients: bloggercourse.com  Fact Sheet for  Healthcare Providers: seriousbroker.it  This test is not yet approved or cleared by the [place]  FDA and has been authorized for detection and/or diagnosis of SARS-CoV-2 by FDA under an Emergency Use Authorization (EUA). This EUA will remain in effect (meaning this test can be used) for the duration of the COVID-19 declaration under Section 564(b)(1) of the Act, 21 U.S.C. section 360bbb-3(b)(1), unless the authorization is terminated or revoked.     Resp Syncytial Virus by PCR NEGATIVE NEGATIVE Final    Comment: (NOTE) Fact Sheet for Patients: bloggercourse.com  Fact Sheet for Healthcare Providers: seriousbroker.it  This test is not yet approved or cleared by the [place]  FDA and has been authorized for detection and/or diagnosis of SARS-CoV-2 by FDA under an Emergency Use Authorization (EUA). This EUA will remain in effect (meaning this test can be used) for the duration of the COVID-19 declaration under Section 564(b)(1) of the Act, 21 U.S.C. section 360bbb-3(b)(1), unless the authorization is terminated or revoked.  Performed at [facility], [address], [place], [place] [address]   MRSA Next Gen by PCR, Nasal  Status: None   Collection Time: 07/15/23  2:09 AM   Specimen: Nasal Mucosa; Nasal Swab  Result Value Ref Range Status   MRSA by PCR Next Gen NOT DETECTED NOT DETECTED Final    Comment: (NOTE) The GeneXpert MRSA Assay (FDA approved for NASAL specimens only), is one component of a comprehensive MRSA colonization surveillance program. It is not intended to diagnose MRSA infection nor to guide or monitor treatment for MRSA infections. Test performance is not FDA approved in patients less than 63 years old. Performed at [facility], [address], [place], [place] [address]   Blood culture (routine x 2)     Status: None   Collection  Time: 07/15/23  2:57 AM   Specimen: BLOOD  Result Value Ref Range Status   Specimen Description   Final    BLOOD BLOOD RIGHT HAND AEROBIC BOTTLE ONLY Performed at [facility], [address], [place], [place] [address]    Special Requests   Final    BOTTLES DRAWN AEROBIC ONLY Blood Culture results may not be optimal due to an inadequate volume of blood received in culture bottles Performed at [facility], [address], [place], [place] [address]    Culture   Final    NO GROWTH 5 DAYS Performed at [facility], [address] [place], [place] [address]    Report Status 07/20/2023 FINAL  Final  Culture, blood (Routine X 2) w Reflex to ID Panel     Status: None (Preliminary result)   Collection Time: 07/17/23  2:59 AM   Specimen: BLOOD RIGHT HAND  Result Value Ref Range Status   Specimen Description   Final    BLOOD RIGHT HAND Performed at [facility], [address] [place], [place] [address]    Special Requests   Final    BOTTLES DRAWN AEROBIC ONLY Blood Culture results may not be optimal due to an inadequate volume of blood received in culture bottles Performed at [facility], [address], [place], [place] [address]    Culture   Final    NO GROWTH 3 DAYS Performed at [facility], [address] [place], [place] [address]    Report Status PENDING  Incomplete  Culture, blood (Routine X 2) w Reflex to ID Panel     Status: None (Preliminary result)   Collection Time: 07/17/23  2:59 AM   Specimen: BLOOD RIGHT HAND  Result Value Ref Range Status   Specimen Description   Final    BLOOD RIGHT HAND Performed at [facility], [address] [place], [place] [address]    Special Requests   Final    BOTTLES DRAWN AEROBIC ONLY Blood Culture results may not be optimal due to an inadequate volume of blood received in culture bottles Performed at [facility], [address], [place], [place] [address]    Culture   Final    NO GROWTH 3 DAYS Performed at [facility], [address] [place], [place] [address]    Report Status PENDING  Incomplete         Radiology Studies: No results found.      Scheduled Meds:  aspirin  EC  81 mg Oral Daily   Chlorhexidine  Gluconate Cloth  6 each Topical Daily   haloperidol  lactate  2 mg Intravenous Once   heparin  injection (subcutaneous)  5,000 Units Subcutaneous Q8H   insulin   aspart  0-9 Units Subcutaneous Q4H   metoprolol  succinate  100 mg Oral Daily   polycarbophil  625 mg Oral BID   QUEtiapine   25 mg Oral QHS   Continuous Infusions:   ceFAZolin  (ANCEF ) IV Stopped (07/20/23 2255)   sodium bicarbonate  150 mEq in sterile water  1,150 mL infusion 65 mL/hr at 07/21/23 0552          Sophie Mao, MD [facility] 07/21/2023, 7:18 AM

## 2023-07-21 NOTE — Progress Notes (Signed)
 Subjective/Chief Complaint: Restrained again overnight for confusion    Objective: Vital signs in last 24 hours: Temp:  [97.3 F (36.3 C)-97.9 F (36.6 C)] 97.8 F (36.6 C) (01/10 0400) Pulse Rate:  [27-121] 76 (01/10 0800) Resp:  [12-23] 16 (01/10 0800) BP: (94-168)/(42-102) 168/69 (01/10 0800) SpO2:  [90 %-100 %] 96 % (01/10 0800) Weight:  [62 kg] 62 kg (01/10 0703) Last BM Date : 07/21/23  Intake/Output from previous day: 01/09 0701 - 01/10 0700 In: 1877.4 [I.V.:1683.5; IV Piggyback:193.9] Out: 2150 [Urine:2150] Intake/Output this shift: Total I/O In: 388.3 [I.V.:388.3] Out: -   Exam: Awake , confused Abdomen is soft with very slight RUQ tenderness and slight guarding this morning  Lab Results:  Recent Labs    07/19/23 0255 07/21/23 0252  WBC 10.1 9.2  HGB 8.3* 8.6*  HCT 22.9* 24.8*  PLT 272 224   BMET Recent Labs    07/20/23 0252 07/21/23 0252  NA 136 137  K 4.4 3.5  CL 105 100  CO2 18* 26  GLUCOSE 119* 109*  BUN 70* 63*  CREATININE 2.47* 2.24*  CALCIUM  8.7* 8.6*   PT/INR No results for input(s): LABPROT, INR in the last 72 hours. ABG No results for input(s): PHART, HCO3 in the last 72 hours.  Invalid input(s): PCO2, PO2  Studies/Results: No results found.  Anti-infectives: Anti-infectives (From admission, onward)    Start     Dose/Rate Route Frequency Ordered Stop   07/20/23 1000  ceFAZolin  (ANCEF ) IVPB 2g/100 mL premix        2 g 200 mL/hr over 30 Minutes Intravenous Every 12 hours 07/20/23 0908     07/18/23 2200  ceFAZolin  (ANCEF ) IVPB 1 g/50 mL premix  Status:  Discontinued        1 g 100 mL/hr over 30 Minutes Intravenous Every 12 hours 07/18/23 0754 07/20/23 0908   07/18/23 1000  ceFAZolin  (ANCEF ) IVPB 2g/100 mL premix        2 g 200 mL/hr over 30 Minutes Intravenous  Once 07/18/23 0754 07/18/23 1022   07/15/23 1300  cefTRIAXone  (ROCEPHIN ) 2 g in sodium chloride  0.9 % 100 mL IVPB  Status:  Discontinued        2  g 200 mL/hr over 30 Minutes Intravenous Every 24 hours 07/15/23 1122 07/18/23 0747   07/15/23 1000  linezolid  (ZYVOX ) IVPB 600 mg  Status:  Discontinued        600 mg 300 mL/hr over 60 Minutes Intravenous Every 12 hours 07/15/23 0217 07/15/23 1124   07/15/23 0600  piperacillin -tazobactam (ZOSYN ) IVPB 3.375 g  Status:  Discontinued        3.375 g 12.5 mL/hr over 240 Minutes Intravenous Every 8 hours 07/14/23 2230 07/15/23 1122   07/14/23 2215  metroNIDAZOLE  (FLAGYL ) IVPB 500 mg        500 mg 100 mL/hr over 60 Minutes Intravenous  Once 07/14/23 2208 07/14/23 2336   07/14/23 1945  vancomycin  (VANCOCIN ) IVPB 1000 mg/200 mL premix        1,000 mg 200 mL/hr over 60 Minutes Intravenous STAT 07/14/23 1937 07/14/23 2153   07/14/23 1915  ceFEPIme  (MAXIPIME ) 2 g in sodium chloride  0.9 % 100 mL IVPB        2 g 200 mL/hr over 30 Minutes Intravenous  Once 07/14/23 1906 07/14/23 2013       Assessment/Plan: Patient with sepsis, multiple medical problems, prior percutaneous cholecystostomy tube for cholecystitis. HIDA scan shows delayed filling of the gallbladder consistent with chronic cholecystitis  -  continued improvement in LFT's and creatinine so will continue to hold on imaging or IR consult -we will see PRN over the weekend.  Please call for any questions or acute changes   Vicenta Poli 07/21/2023

## 2023-07-21 NOTE — Plan of Care (Signed)
  Problem: Fluid Volume: Goal: Ability to maintain a balanced intake and output will improve Outcome: Progressing   Problem: Health Behavior/Discharge Planning: Goal: Ability to identify and utilize available resources and services will improve Outcome: Progressing   Problem: Metabolic: Goal: Ability to maintain appropriate glucose levels will improve Outcome: Progressing   Problem: Nutritional: Goal: Maintenance of adequate nutrition will improve Outcome: Progressing Goal: Progress toward achieving an optimal weight will improve Outcome: Progressing   Problem: Skin Integrity: Goal: Risk for impaired skin integrity will decrease Outcome: Progressing

## 2023-07-22 DIAGNOSIS — A4151 Sepsis due to Escherichia coli [E. coli]: Secondary | ICD-10-CM | POA: Diagnosis not present

## 2023-07-22 DIAGNOSIS — I5020 Unspecified systolic (congestive) heart failure: Secondary | ICD-10-CM

## 2023-07-22 DIAGNOSIS — R6521 Severe sepsis with septic shock: Secondary | ICD-10-CM | POA: Diagnosis not present

## 2023-07-22 DIAGNOSIS — N170 Acute kidney failure with tubular necrosis: Secondary | ICD-10-CM | POA: Diagnosis not present

## 2023-07-22 LAB — COMPREHENSIVE METABOLIC PANEL
ALT: 26 U/L (ref 0–44)
AST: 159 U/L — ABNORMAL HIGH (ref 15–41)
Albumin: 2.3 g/dL — ABNORMAL LOW (ref 3.5–5.0)
Alkaline Phosphatase: 93 U/L (ref 38–126)
Anion gap: 8 (ref 5–15)
BUN: 47 mg/dL — ABNORMAL HIGH (ref 8–23)
CO2: 31 mmol/L (ref 22–32)
Calcium: 8.2 mg/dL — ABNORMAL LOW (ref 8.9–10.3)
Chloride: 97 mmol/L — ABNORMAL LOW (ref 98–111)
Creatinine, Ser: 1.52 mg/dL — ABNORMAL HIGH (ref 0.61–1.24)
GFR, Estimated: 47 mL/min — ABNORMAL LOW (ref >60)
Glucose, Bld: 164 mg/dL — ABNORMAL HIGH (ref 70–99)
Potassium: 3.5 mmol/L (ref 3.5–5.1)
Sodium: 136 mmol/L (ref 135–145)
Total Bilirubin: 0.8 mg/dL (ref 0.0–1.2)
Total Protein: 4.8 g/dL — ABNORMAL LOW (ref 6.5–8.1)

## 2023-07-22 LAB — CBC WITH DIFFERENTIAL/PLATELET
Abs Immature Granulocytes: 0.25 10*3/uL — ABNORMAL HIGH (ref 0.00–0.07)
Basophils Absolute: 0 10*3/uL (ref 0.0–0.1)
Basophils Relative: 1 %
Eosinophils Absolute: 0.1 10*3/uL (ref 0.0–0.5)
Eosinophils Relative: 1 %
HCT: 20.8 % — ABNORMAL LOW (ref 39.0–52.0)
Hemoglobin: 7.3 g/dL — ABNORMAL LOW (ref 13.0–17.0)
Immature Granulocytes: 3 %
Lymphocytes Relative: 10 %
Lymphs Abs: 0.9 10*3/uL (ref 0.7–4.0)
MCH: 29.1 pg (ref 26.0–34.0)
MCHC: 35.1 g/dL (ref 30.0–36.0)
MCV: 82.9 fL (ref 80.0–100.0)
Monocytes Absolute: 0.9 10*3/uL (ref 0.1–1.0)
Monocytes Relative: 11 %
Neutro Abs: 6.4 10*3/uL (ref 1.7–7.7)
Neutrophils Relative %: 74 %
Platelets: 175 10*3/uL (ref 150–400)
RBC: 2.51 MIL/uL — ABNORMAL LOW (ref 4.22–5.81)
RDW: 17.5 % — ABNORMAL HIGH (ref 11.5–15.5)
WBC: 8.6 10*3/uL (ref 4.0–10.5)
nRBC: 0 % (ref 0.0–0.2)

## 2023-07-22 LAB — CULTURE, BLOOD (ROUTINE X 2)
Culture: NO GROWTH
Culture: NO GROWTH

## 2023-07-22 LAB — GLUCOSE, CAPILLARY
Glucose-Capillary: 154 mg/dL — ABNORMAL HIGH (ref 70–99)
Glucose-Capillary: 106 mg/dL — ABNORMAL HIGH (ref 70–99)
Glucose-Capillary: 123 mg/dL — ABNORMAL HIGH (ref 70–99)
Glucose-Capillary: 195 mg/dL — ABNORMAL HIGH (ref 70–99)
Glucose-Capillary: 237 mg/dL — ABNORMAL HIGH (ref 70–99)
Glucose-Capillary: 190 mg/dL — ABNORMAL HIGH (ref 70–99)

## 2023-07-22 LAB — MAGNESIUM: Magnesium: 1.6 mg/dL — ABNORMAL LOW (ref 1.7–2.4)

## 2023-07-22 MED ORDER — MAGNESIUM SULFATE 2 GM/50ML IV SOLN
2.0000 g | Freq: Once | INTRAVENOUS | Status: AC
  Administered 2023-07-22: 2 g via INTRAVENOUS
  Filled 2023-07-22: qty 50

## 2023-07-22 MED ORDER — TRAZODONE HCL 50 MG PO TABS
25.0000 mg | ORAL_TABLET | Freq: Once | ORAL | Status: AC
Start: 2023-07-22 — End: 2023-07-22
  Administered 2023-07-22: 25 mg via ORAL
  Filled 2023-07-22: qty 1

## 2023-07-22 MED ORDER — CEFAZOLIN SODIUM-DEXTROSE 2-4 GM/100ML-% IV SOLN
2.0000 g | Freq: Three times a day (TID) | INTRAVENOUS | Status: AC
  Administered 2023-07-22 – 2023-07-24 (×8): 2 g via INTRAVENOUS
  Filled 2023-07-22 (×9): qty 100

## 2023-07-22 NOTE — Plan of Care (Signed)
  Problem: Clinical Measurements: Goal: Respiratory complications will improve Outcome: Progressing   Problem: Education: Goal: Knowledge of General Education information will improve Description: Including pain rating scale, medication(s)/side effects and non-pharmacologic comfort measures Outcome: Not Progressing   Problem: Clinical Measurements: Goal: Ability to maintain clinical measurements within normal limits will improve Outcome: Not Progressing Goal: Cardiovascular complication will be avoided Outcome: Not Progressing

## 2023-07-22 NOTE — Progress Notes (Signed)
 PROGRESS NOTE    Nathan [name]  FMW:[id number] DOB: 1946-01-17 DOA: 07/14/2023 PCP: Yolande Toribio MATSU, MD   Brief Narrative:  78 year old male with history of cholecystitis (had percutaneous cholecystectomy drain placed on 03/02/2023), diabetes mellitus type 2, hyperlipidemia, hypertension, paroxysmal A-fib not on anticoagulation due to history of bleeding, chronic systolic CHF, CAD status post CABG and AVR, CKD stage IIIa, OSA presented with worsening abdominal pain.  CT of abdomen and pelvis showed inflammatory changes of gallbladder is consistent with acute cholecystitis.  He was initially admitted to ICU under PCCM service for septic shock secondary to acute cholecystitis and started on pressors and IV antibiotics.  General surgery was consulted.  His troponins trended up to 2264.  Cardiology was consulted.  He was treated with 48 hours of IV heparin .  He was also found to have AKI: Nephrology was consulted.  He was transferred to TRH service from 07/17/2023 onwards.  Assessment & Plan:   Septic shock: Present on admission E. coli bacteremia Acute on chronic calculus cholecystitis -Shock has resolved.  Pressors have been weaned off.  Transferred to TRH service from 07/17/2023 onwards -HIDA scan negative for obstruction: General Surgery recommends no need for any percutaneous cholecystostomy tube placement at this time.  LFTs improving.  Abdominal exam is improving -Continue IV Ancef . Repeat blood cultures have been negative so far  Acute on chronic systolic heart failure Permanent A-fib: Not on anticoagulation due to history of GI bleeding/duodenal ulcer perforation CAD with history of CABG and aortic stenosis status post AVR Demand ischemia/troponin elevation Hyperlipidemia -Limited echo showed EF of 35 to 40%.  Troponins elevated up to 2264.  Possibly from demand ischemia.  Treated with IV heparin  for 48 hours and switched to subcutaneous heparin .  Continue aspirin  and metoprolol .  Home  statin on hold because of elevated liver enzymes -Strict input and output.  Daily weights.  Fluid restriction. -GDMT limited by hypotension  AKI on CKD stage IIIb Acute metabolic acidosis -Baseline creatinine of 1.7 from October 2024.  Creatinine peaked to 3.39.  Creatinine 1.52 today.  Nephrology signed off on 1/101/25. DC bicarb drip today; bicarb is 31 today.  Monitor renal function.  Avoid nephrotoxic medications.  Anemia of chronic disease -Hb down trending. Monitor. Transfuse if Hb<7  Elevated LFTs -Improving.  Monitor  Diabetes mellitus type 2 with hyperglycemia - Continue CBGs with SSI.  Leukocytosis -Resolved  Hypomagnesemia -Replace. Repeat AM labs  Acute metabolic encephalopathy/delirium Probable dementia -Patient was agitated overnight on 07/19/23.  Probably has undiagnosed dementia which has gotten worse in the hospital because of delirium -Patient has been started on scheduled Seroquel  from 07/20/2023 evening onwards.  Use Haldol  as needed.  Fall precautions.  Monitor mental status.   -Palliative care following  DVT prophylaxis:  subcutaneous heparin  Code Status: DNR Family Communication: None at bedside Disposition Plan: Status is: Inpatient Remains inpatient appropriate because: Of severity of illness    Consultants: PCCM/General Surgery/cardiology/nephrology.  palliative care  Procedures: As above  Antimicrobials:  Anti-infectives (From admission, onward)    Start     Dose/Rate Route Frequency Ordered Stop   07/20/23 1000  ceFAZolin  (ANCEF ) IVPB 2g/100 mL premix        2 g 200 mL/hr over 30 Minutes Intravenous Every 12 hours 07/20/23 0908     07/18/23 2200  ceFAZolin  (ANCEF ) IVPB 1 g/50 mL premix  Status:  Discontinued        1 g 100 mL/hr over 30 Minutes Intravenous Every 12 hours 07/18/23 0754 07/20/23 0908  07/18/23 1000  ceFAZolin  (ANCEF ) IVPB 2g/100 mL premix        2 g 200 mL/hr over 30 Minutes Intravenous  Once 07/18/23 0754 07/18/23 1022    07/15/23 1300  cefTRIAXone  (ROCEPHIN ) 2 g in sodium chloride  0.9 % 100 mL IVPB  Status:  Discontinued        2 g 200 mL/hr over 30 Minutes Intravenous Every 24 hours 07/15/23 1122 07/18/23 0747   07/15/23 1000  linezolid  (ZYVOX ) IVPB 600 mg  Status:  Discontinued        600 mg 300 mL/hr over 60 Minutes Intravenous Every 12 hours 07/15/23 0217 07/15/23 1124   07/15/23 0600  piperacillin -tazobactam (ZOSYN ) IVPB 3.375 g  Status:  Discontinued        3.375 g 12.5 mL/hr over 240 Minutes Intravenous Every 8 hours 07/14/23 2230 07/15/23 1122   07/14/23 2215  metroNIDAZOLE  (FLAGYL ) IVPB 500 mg        500 mg 100 mL/hr over 60 Minutes Intravenous  Once 07/14/23 2208 07/14/23 2336   07/14/23 1945  vancomycin  (VANCOCIN ) IVPB 1000 mg/200 mL premix        1,000 mg 200 mL/hr over 60 Minutes Intravenous STAT 07/14/23 1937 07/14/23 2153   07/14/23 1915  ceFEPIme  (MAXIPIME ) 2 g in sodium chloride  0.9 % 100 mL IVPB        2 g 200 mL/hr over 30 Minutes Intravenous  Once 07/14/23 1906 07/14/23 2013        Subjective: Patient seen and examined at bedside.  Poor historian. Received haldol  overnight as per nursing staff. No fever, vomiting reported.  Objective: Vitals:   07/22/23 0500 07/22/23 0530 07/22/23 0600 07/22/23 0630  BP: (!) 197/68 (!) 167/76 136/67 (!) 192/87  Pulse: (!) 34   (!) 30  Resp: 10 11 11 14   Temp:      TempSrc:      SpO2: 98%   100%  Weight: 62.7 kg     Height:        Intake/Output Summary (Last 24 hours) at 07/22/2023 0703 Last data filed at 07/22/2023 0655 Gross per 24 hour  Intake 2702.68 ml  Output 2250 ml  Net 452.68 ml   Filed Weights   07/20/23 0445 07/21/23 0703 07/22/23 0500  Weight: 61.2 kg 62 kg 62.7 kg    Examination:  General: No acute distress.  Remains on room air.  Chronically ill and deconditioned looking. ENT/neck: No JVD elevation or palpable neck masses respiratory: Bilateral decreased breath sounds at bases with scattered crackles CVS: Rate  mostly controlled; S1 and S2 are heard Abdominal: Soft, tender mildly with some distention; no organomegaly, bowel sounds are heard normally Extremities: No clubbing; trace lower extremity edema present CNS: Currently remains confused.  Still very slow to respond.  Poor historian.  No obvious focal neurologic deficit.   Lymph: No obvious palpable lymphadenopathy Skin: No obvious petechiae/rashes psych: Flat affect.  Not agitated currently.   Musculoskeletal: No obvious joint tenderness/erythema    Data Reviewed: I have personally reviewed following labs and imaging studies  CBC: Recent Labs  Lab 07/17/23 0259 07/18/23 0258 07/19/23 0255 07/21/23 0252 07/22/23 0323  WBC 13.9* 13.6* 10.1 9.2 8.6  NEUTROABS  --   --   --  6.5 6.4  HGB 8.0* 9.6* 8.3* 8.6* 7.3*  HCT 23.4* 28.2* 22.9* 24.8* 20.8*  MCV 83.3 84.4 80.1 83.8 82.9  PLT 243 286 272 224 175   Basic Metabolic Panel: Recent Labs  Lab 07/16/23 0801 07/17/23 0259  07/18/23 0258 07/19/23 0255 07/20/23 0252 07/21/23 0252 07/22/23 0323  NA 132*   < > 133* 129* 136 137 136  K 4.4   < > 4.8 4.7 4.4 3.5 3.5  CL 100   < > 100 98 105 100 97*  CO2 15*   < > 15* 17* 18* 26 31  GLUCOSE 112*   < > 135* 118* 119* 109* 164*  BUN 64*   < > 79* 80* 70* 63* 47*  CREATININE 2.98*   < > 3.39* 3.18* 2.47* 2.24* 1.52*  CALCIUM  8.7*   < > 9.0 8.7* 8.7* 8.6* 8.2*  MG 2.4  --   --   --   --  1.8 1.6*   < > = values in this interval not displayed.   GFR: Estimated Creatinine Clearance: 35.5 mL/min (A) (by C-G formula based on SCr of 1.52 mg/dL (H)). Liver Function Tests: Recent Labs  Lab 07/18/23 0258 07/19/23 0255 07/20/23 0252 07/21/23 0252 07/22/23 0323  AST 772* 715* 231* 202* 159*  ALT 394* 372* 123* 66* 26  ALKPHOS 166* 131* 113 104 93  BILITOT 1.4* 1.7* 1.3* 1.3* 0.8  PROT 6.6 5.9* 5.1* 5.4* 4.8*  ALBUMIN 2.8* 2.7* 2.4* 2.6* 2.3*   No results for input(s): LIPASE, AMYLASE in the last 168 hours.  No results for  input(s): AMMONIA in the last 168 hours. Coagulation Profile: No results for input(s): INR, PROTIME in the last 168 hours.  Cardiac Enzymes: No results for input(s): CKTOTAL, CKMB, CKMBINDEX, TROPONINI in the last 168 hours. BNP (last 3 results) Recent Labs    04/20/23 1126  PROBNP 7,080*   HbA1C: No results for input(s): HGBA1C in the last 72 hours. CBG: Recent Labs  Lab 07/21/23 1134 07/21/23 1448 07/21/23 1953 07/21/23 2340 07/22/23 0309  GLUCAP 147* 187* 262* 224* 154*   Lipid Profile: No results for input(s): CHOL, HDL, LDLCALC, TRIG, CHOLHDL, LDLDIRECT in the last 72 hours.  Thyroid  Function Tests: No results for input(s): TSH, T4TOTAL, FREET4, T3FREE, THYROIDAB in the last 72 hours. Anemia Panel: No results for input(s): VITAMINB12, FOLATE, FERRITIN, TIBC, IRON, RETICCTPCT in the last 72 hours. Sepsis Labs: Recent Labs  Lab 07/15/23 1148 07/16/23 0801  LATICACIDVEN 4.4* 1.6    Recent Results (from the past 240 hours)  Blood culture (routine x 2)     Status: Abnormal   Collection Time: 07/14/23  6:47 PM   Specimen: BLOOD  Result Value Ref Range Status   Specimen Description   Final    BLOOD LEFT ANTECUBITAL Performed at [facility], [address] [place], [place] [address]    Special Requests   Final    BOTTLES DRAWN AEROBIC AND ANAEROBIC Blood Culture adequate volume Performed at [facility], [address], [place], [place] [address]    Culture  Setup Time   Final    GRAM NEGATIVE RODS IN BOTH AEROBIC AND ANAEROBIC BOTTLES CRITICAL RESULT CALLED TO, READ BACK BY AND VERIFIED WITH: [facility] SWAYNE [id number] AT 1105 BY EC Performed at [facility], [address] [place], [place] [address]    Culture ESCHERICHIA COLI (A)  Final   Report Status 07/17/2023 FINAL  Final   Organism ID, Bacteria ESCHERICHIA COLI  Final   Organism ID, Bacteria ESCHERICHIA  COLI  Final      Susceptibility   Escherichia coli - KIRBY BAUER*    CEFAZOLIN  SENSITIVE Sensitive    Escherichia coli - MIC*  AMPICILLIN  <=2 SENSITIVE Sensitive     CEFEPIME  <=0.12 SENSITIVE Sensitive     CEFTAZIDIME <=1 SENSITIVE Sensitive     CEFTRIAXONE  <=0.25 SENSITIVE Sensitive     CIPROFLOXACIN <=0.25 SENSITIVE Sensitive     GENTAMICIN <=1 SENSITIVE Sensitive     IMIPENEM <=0.25 SENSITIVE Sensitive     TRIMETH/SULFA <=20 SENSITIVE Sensitive     AMPICILLIN /SULBACTAM <=2 SENSITIVE Sensitive     PIP/TAZO <=4 SENSITIVE Sensitive ug/mL    * ESCHERICHIA COLI    ESCHERICHIA COLI  Blood Culture ID Panel (Reflexed)     Status: Abnormal   Collection Time: 07/14/23  6:47 PM  Result Value Ref Range Status   Enterococcus faecalis NOT DETECTED NOT DETECTED Final   Enterococcus Faecium NOT DETECTED NOT DETECTED Final   Listeria monocytogenes NOT DETECTED NOT DETECTED Final   Staphylococcus species NOT DETECTED NOT DETECTED Final   Staphylococcus aureus (BCID) NOT DETECTED NOT DETECTED Final   Staphylococcus epidermidis NOT DETECTED NOT DETECTED Final   Staphylococcus lugdunensis NOT DETECTED NOT DETECTED Final   Streptococcus species NOT DETECTED NOT DETECTED Final   Streptococcus agalactiae NOT DETECTED NOT DETECTED Final   Streptococcus pneumoniae NOT DETECTED NOT DETECTED Final   Streptococcus pyogenes NOT DETECTED NOT DETECTED Final   A.calcoaceticus-baumannii NOT DETECTED NOT DETECTED Final   Bacteroides fragilis NOT DETECTED NOT DETECTED Final   Enterobacterales DETECTED (A) NOT DETECTED Final    Comment: Enterobacterales represent a large order of gram negative bacteria, not a single organism. CRITICAL RESULT CALLED TO, READ BACK BY AND VERIFIED WITH: PHARMD MARY SWAYNE [id number] AT 1105 BY EC    Enterobacter cloacae complex NOT DETECTED NOT DETECTED Final   Escherichia coli DETECTED (A) NOT DETECTED Final    Comment: CRITICAL RESULT CALLED TO, READ BACK BY AND VERIFIED  WITH: PHARMD MARY SWAYNE [id number] AT 1105 BY EC    Klebsiella aerogenes NOT DETECTED NOT DETECTED Final   Klebsiella oxytoca NOT DETECTED NOT DETECTED Final   Klebsiella pneumoniae NOT DETECTED NOT DETECTED Final   Proteus species NOT DETECTED NOT DETECTED Final   Salmonella species NOT DETECTED NOT DETECTED Final   Serratia marcescens NOT DETECTED NOT DETECTED Final   Haemophilus influenzae NOT DETECTED NOT DETECTED Final   Neisseria meningitidis NOT DETECTED NOT DETECTED Final   Pseudomonas aeruginosa NOT DETECTED NOT DETECTED Final   Stenotrophomonas maltophilia NOT DETECTED NOT DETECTED Final   Candida albicans NOT DETECTED NOT DETECTED Final   Candida auris NOT DETECTED NOT DETECTED Final   Candida glabrata NOT DETECTED NOT DETECTED Final   Candida krusei NOT DETECTED NOT DETECTED Final   Candida parapsilosis NOT DETECTED NOT DETECTED Final   Candida tropicalis NOT DETECTED NOT DETECTED Final   Cryptococcus neoformans/gattii NOT DETECTED NOT DETECTED Final   CTX-M ESBL NOT DETECTED NOT DETECTED Final   Carbapenem resistance IMP NOT DETECTED NOT DETECTED Final   Carbapenem resistance KPC NOT DETECTED NOT DETECTED Final   Carbapenem resistance NDM NOT DETECTED NOT DETECTED Final   Carbapenem resist OXA 48 LIKE NOT DETECTED NOT DETECTED Final   Carbapenem resistance VIM NOT DETECTED NOT DETECTED Final    Comment: Performed at [facility], [address] [place], [place] [address]  Resp panel by RT-PCR (RSV, Flu A&B, Covid) Anterior Nasal Swab     Status: None   Collection Time: 07/14/23  7:01 PM   Specimen: Anterior Nasal Swab  Result Value Ref Range Status   SARS Coronavirus 2 by RT PCR NEGATIVE NEGATIVE Final  Comment: (NOTE) SARS-CoV-2 target nucleic acids are NOT DETECTED.  The SARS-CoV-2 RNA is generally detectable in upper respiratory specimens during the acute phase of infection. The lowest concentration of SARS-CoV-2 viral copies this assay can detect is 138  copies/mL. A negative result does not preclude SARS-Cov-2 infection and should not be used as the sole basis for treatment or other patient management decisions. A negative result may occur with  improper specimen collection/handling, submission of specimen other than nasopharyngeal swab, presence of viral mutation(s) within the areas targeted by this assay, and inadequate number of viral copies(<138 copies/mL). A negative result must be combined with clinical observations, patient history, and epidemiological information. The expected result is Negative.  Fact Sheet for Patients:  bloggercourse.com  Fact Sheet for Healthcare Providers:  seriousbroker.it  This test is no t yet approved or cleared by the [place]  FDA and  has been authorized for detection and/or diagnosis of SARS-CoV-2 by FDA under an Emergency Use Authorization (EUA). This EUA will remain  in effect (meaning this test can be used) for the duration of the COVID-19 declaration under Section 564(b)(1) of the Act, 21 U.S.C.section 360bbb-3(b)(1), unless the authorization is terminated  or revoked sooner.       Influenza A by PCR NEGATIVE NEGATIVE Final   Influenza B by PCR NEGATIVE NEGATIVE Final    Comment: (NOTE) The Xpert Xpress SARS-CoV-2/FLU/RSV plus assay is intended as an aid in the diagnosis of influenza from Nasopharyngeal swab specimens and should not be used as a sole basis for treatment. Nasal washings and aspirates are unacceptable for Xpert Xpress SARS-CoV-2/FLU/RSV testing.  Fact Sheet for Patients: bloggercourse.com  Fact Sheet for Healthcare Providers: seriousbroker.it  This test is not yet approved or cleared by the [place]  FDA and has been authorized for detection and/or diagnosis of SARS-CoV-2 by FDA under an Emergency Use Authorization (EUA). This EUA will remain in effect (meaning  this test can be used) for the duration of the COVID-19 declaration under Section 564(b)(1) of the Act, 21 U.S.C. section 360bbb-3(b)(1), unless the authorization is terminated or revoked.     Resp Syncytial Virus by PCR NEGATIVE NEGATIVE Final    Comment: (NOTE) Fact Sheet for Patients: bloggercourse.com  Fact Sheet for Healthcare Providers: seriousbroker.it  This test is not yet approved or cleared by the [place]  FDA and has been authorized for detection and/or diagnosis of SARS-CoV-2 by FDA under an Emergency Use Authorization (EUA). This EUA will remain in effect (meaning this test can be used) for the duration of the COVID-19 declaration under Section 564(b)(1) of the Act, 21 U.S.C. section 360bbb-3(b)(1), unless the authorization is terminated or revoked.  Performed at [facility], [address], [place], [place] [address]   MRSA Next Gen by PCR, Nasal     Status: None   Collection Time: 07/15/23  2:09 AM   Specimen: Nasal Mucosa; Nasal Swab  Result Value Ref Range Status   MRSA by PCR Next Gen NOT DETECTED NOT DETECTED Final    Comment: (NOTE) The GeneXpert MRSA Assay (FDA approved for NASAL specimens only), is one component of a comprehensive MRSA colonization surveillance program. It is not intended to diagnose MRSA infection nor to guide or monitor treatment for MRSA infections. Test performance is not FDA approved in patients less than 81 years old. Performed at [facility], [address], [place], [place] [address]   Blood culture (routine x 2)     Status: None   Collection Time:  07/15/23  2:57 AM   Specimen: BLOOD  Result Value Ref Range Status   Specimen Description   Final    BLOOD BLOOD RIGHT HAND AEROBIC BOTTLE ONLY Performed at [facility], [address] [place], [place] [address]    Special Requests   Final    BOTTLES DRAWN AEROBIC  ONLY Blood Culture results may not be optimal due to an inadequate volume of blood received in culture bottles Performed at [facility], [address] [place], [place] [address]    Culture   Final    NO GROWTH 5 DAYS Performed at [facility], [address] [place], [place] [address]    Report Status 07/20/2023 FINAL  Final  Culture, blood (Routine X 2) w Reflex to ID Panel     Status: None (Preliminary result)   Collection Time: 07/17/23  2:59 AM   Specimen: BLOOD RIGHT HAND  Result Value Ref Range Status   Specimen Description   Final    BLOOD RIGHT HAND Performed at [facility], [address] [place], [place] [address]    Special Requests   Final    BOTTLES DRAWN AEROBIC ONLY Blood Culture results may not be optimal due to an inadequate volume of blood received in culture bottles Performed at [facility], [address] [place], [place] [address]    Culture   Final    NO GROWTH 4 DAYS Performed at [facility], [address] [place], [place] [address]    Report Status PENDING  Incomplete  Culture, blood (Routine X 2) w Reflex to ID Panel     Status: None (Preliminary result)   Collection Time: 07/17/23  2:59 AM   Specimen: BLOOD RIGHT HAND  Result Value Ref Range Status   Specimen Description   Final    BLOOD RIGHT HAND Performed at [facility], [address], [place], [place] [address]    Special Requests   Final    BOTTLES DRAWN AEROBIC ONLY Blood Culture results may not be optimal due to an inadequate volume of blood received in culture bottles Performed at [facility], [address], [place], [place] [address]    Culture   Final    NO GROWTH 4 DAYS Performed at [facility], [address] [place], [place] [address]    Report Status PENDING  Incomplete         Radiology Studies: No results found.      Scheduled Meds:  aspirin  EC  81 mg Oral Daily    Chlorhexidine  Gluconate Cloth  6 each Topical Daily   haloperidol  lactate  2 mg Intravenous Once   heparin  injection (subcutaneous)  5,000 Units Subcutaneous Q8H   insulin  aspart  0-9 Units Subcutaneous Q4H   metoprolol  succinate  100 mg Oral Daily   polycarbophil  625 mg Oral BID   QUEtiapine   25 mg Oral QHS   Continuous Infusions:   ceFAZolin  (ANCEF ) IV Stopped (07/21/23 2117)   sodium bicarbonate  150 mEq in sterile water  1,150 mL infusion 75 mL/hr at 07/22/23 0655          Sophie Mao, MD [facility] 07/22/2023, 7:03 AM

## 2023-07-22 NOTE — Progress Notes (Addendum)
 Patient Name: Nathan [name] Date of Encounter: 07/22/2023 [facility] Cardiologist: Oneil Parchment, MD   Interval Summary  .    Pt sedated   Appears comfortable in bed  Vital Signs .    Vitals:   07/22/23 0322 07/22/23 0332 07/22/23 0345 07/22/23 0400  BP: 119/63 (!) 116/55 133/67   Pulse: (!) 33 (!) 41 (!) 39   Resp: 10 10 14    Temp:    98.6 F (37 C)  TempSrc:    Axillary  SpO2: 98% 100% 100%   Weight:      Height:        Intake/Output Summary (Last 24 hours) at 07/22/2023 0615 Last data filed at 07/22/2023 0400 Gross per 24 hour  Intake 2247.03 ml  Output 2250 ml  Net -2.97 ml      07/21/2023    7:03 AM 07/20/2023    4:45 AM 07/19/2023    3:05 PM  Last 3 Weights  Weight (lbs) 136 lb 11 oz 134 lb 14.7 oz 142 lb 13.7 oz  Weight (kg) 62 kg 61.2 kg 64.8 kg      Telemetry/ECG    Telemetry:   afib   80s   Occasional PVc  Echo 07/15/23:   1. Limited study.   2. Left ventricular ejection fraction, by estimation, is 35 to 40%. The  left ventricle has moderately decreased function. The left ventricle  demonstrates global hypokinesis, more prominent mid to distal anteroseptal  wall. The left ventricular internal  cavity size was mildly dilated.   3. No LV mural thrombus noted with Definity  contrast.   4. Right ventricular systolic function is normal. The right ventricular  size is normal.   5. Left atrial size was severely dilated.   6. The mitral valve is degenerative. Moderate mitral annular  calcification.   7. The aortic valve has been repaired/replaced. Aortic valve  regurgitation is not visualized. There is a 23 mm Edwards bioprosthetic  valve present in the aortic position. Aortic valve mean gradient measures  7.0 mmHg.   Physical Exam .    VS:  BP 133/67   Pulse (!) 39   Temp 98.6 F (37 C) (Axillary)   Resp 14   Ht 5' 7 (1.702 m)   Wt 62 kg   SpO2 100%   BMI 21.41 kg/m  , BMI Body mass index is 21.41 kg/m. GENERAL: Chronically  ill-appearing.  Frail.    NECK:  JVP is normal   LUNGS:  Clear to auscultation anteriorly  HEART:  Irreg irreg   No S3   No significant murmurs  ABD: + BS  Soft Ext   No LE edema   Assessment & Plan .      # CAD s/p CABG:   Elevated troponin Patient has a history of CABG. Now with elevated troponin to 2,264.   Fet to be demand ischemia in setting of sepsis   Received IV heparin     Would plan contniued medical care.  Optimise BP No intervention planned    # HFrEF:  LVEF was 20-25% 02/2023 in the setting of NSTEMI.  This improved to 35-40% after medical management. This admission LVEF 35-40% as well. Volume status appears good  Pt on Toprol  XL     No other GDMT    follow  BP was low last night  when he got a lot of sedation for agitation   Follow  Would resume Imdur  when able   Possible hydralazine  when BP allows  #  s/p AVR:  Stable.  Mean gradient 7 mmHg.   # Hypertension:  Home HCTZ minoxidil, spironolactone , and Imdur  are all on hold. Continue metoprolol   Note BP very low last night when got a lot of sedation   # Permanent atrial fibrillation:  Not on anticoagulation 2/2 GI bleeding 2/2 duodenal ulcer perforation.    Pt till severely  anemic    follow  Keep on current rate control    If improves, consider anticoagulation but again, against risk of future bleeding    # Acute on chronic CKD  Cr 1.52 today    Will sign off    Please call for questions    For questions or updates, please contact [facility] Please consult [email or web address] for contact info under        Signed, Vina Gull, MD

## 2023-07-22 NOTE — Progress Notes (Signed)
 Pharmacy Antibiotic Note  Nathan [name] is a 78 y.o. male admitted on 07/14/2023 with E.coli bacteremia related to likely intra-abdominal source in the setting of chronic cholecystitis.  Pharmacy has been consulted for Cefazolin  dosing.   Day 9 total abxs, Day 5 Ancef  Improved SCr 1.52, CrCl 35.5 WBC remains WNL Afebrile  Plan: - Change Ancef  to 2g IV q8 hr for improved renal function - Will likely need 14 day course - will continue to follow   Height: 5' 7 (170.2 cm) Weight: 62.7 kg (138 lb 3.7 oz) IBW/kg (Calculated) : 66.1  Temp (24hrs), Avg:97.6 F (36.4 C), Min:96.5 F (35.8 C), Max:98.6 F (37 C)  Recent Labs  Lab 07/15/23 1148 07/16/23 0307 07/16/23 0801 07/17/23 0259 07/18/23 0258 07/19/23 0255 07/20/23 0252 07/21/23 0252 07/22/23 0323  WBC  --    < >  --  13.9* 13.6* 10.1  --  9.2 8.6  CREATININE 2.25*   < > 2.98* 3.17* 3.39* 3.18* 2.47* 2.24* 1.52*  LATICACIDVEN 4.4*  --  1.6  --   --   --   --   --   --    < > = values in this interval not displayed.    Estimated Creatinine Clearance: 35.5 mL/min (A) (by C-G formula based on SCr of 1.52 mg/dL (H)).    Allergies  Allergen Reactions   Shellfish-Derived Products Anaphylaxis   Nsaids Other (See Comments)    H/o gastric ulcers = NO NSAIDs per GI   Zolpidem Tartrate Other (See Comments)    Hallucinations    Antimicrobials this admission: Cefepime  1/3 x 1 Vancomycin  1/3 x 1 Flagyl  1/3 x 1 Zosyn  1/4 x 1 Linezolid  1/4 x 1 Ceftriaxone  1/4 >> 1/6 Cefazolin  1/7 >>  Microbiology results: 1/3 BCx >> 2/2 E.coli (pan-sensitive) 1/4 BCx >> ngF 1/6 BCx >> ngtd  Thank you for allowing pharmacy to be a part of this patient's care  Rosaline IVAR Edison, Pharm.D Use secure chat for questions 07/22/2023 7:32 AM

## 2023-07-23 DIAGNOSIS — Z515 Encounter for palliative care: Secondary | ICD-10-CM | POA: Diagnosis not present

## 2023-07-23 DIAGNOSIS — A4151 Sepsis due to Escherichia coli [E. coli]: Secondary | ICD-10-CM | POA: Diagnosis not present

## 2023-07-23 DIAGNOSIS — R6521 Severe sepsis with septic shock: Secondary | ICD-10-CM | POA: Diagnosis not present

## 2023-07-23 DIAGNOSIS — N170 Acute kidney failure with tubular necrosis: Secondary | ICD-10-CM | POA: Diagnosis not present

## 2023-07-23 LAB — COMPREHENSIVE METABOLIC PANEL
ALT: 19 U/L (ref 0–44)
AST: 148 U/L — ABNORMAL HIGH (ref 15–41)
Albumin: 2.4 g/dL — ABNORMAL LOW (ref 3.5–5.0)
Alkaline Phosphatase: 126 U/L (ref 38–126)
Anion gap: 9 (ref 5–15)
BUN: 36 mg/dL — ABNORMAL HIGH (ref 8–23)
CO2: 28 mmol/L (ref 22–32)
Calcium: 8.5 mg/dL — ABNORMAL LOW (ref 8.9–10.3)
Chloride: 100 mmol/L (ref 98–111)
Creatinine, Ser: 1.33 mg/dL — ABNORMAL HIGH (ref 0.61–1.24)
GFR, Estimated: 55 mL/min — ABNORMAL LOW (ref >60)
Glucose, Bld: 131 mg/dL — ABNORMAL HIGH (ref 70–99)
Potassium: 3.5 mmol/L (ref 3.5–5.1)
Sodium: 137 mmol/L (ref 135–145)
Total Bilirubin: 0.9 mg/dL (ref 0.0–1.2)
Total Protein: 5.2 g/dL — ABNORMAL LOW (ref 6.5–8.1)

## 2023-07-23 LAB — CBC WITH DIFFERENTIAL/PLATELET
Abs Immature Granulocytes: 0.26 10*3/uL — ABNORMAL HIGH (ref 0.00–0.07)
Basophils Absolute: 0 10*3/uL (ref 0.0–0.1)
Basophils Relative: 0 %
Eosinophils Absolute: 0.2 10*3/uL (ref 0.0–0.5)
Eosinophils Relative: 2 %
HCT: 24.2 % — ABNORMAL LOW (ref 39.0–52.0)
Hemoglobin: 8.3 g/dL — ABNORMAL LOW (ref 13.0–17.0)
Immature Granulocytes: 3 %
Lymphocytes Relative: 15 %
Lymphs Abs: 1.4 10*3/uL (ref 0.7–4.0)
MCH: 29.1 pg (ref 26.0–34.0)
MCHC: 34.3 g/dL (ref 30.0–36.0)
MCV: 84.9 fL (ref 80.0–100.0)
Monocytes Absolute: 0.9 10*3/uL (ref 0.1–1.0)
Monocytes Relative: 9 %
Neutro Abs: 7.1 10*3/uL (ref 1.7–7.7)
Neutrophils Relative %: 71 %
Platelets: 178 10*3/uL (ref 150–400)
RBC: 2.85 MIL/uL — ABNORMAL LOW (ref 4.22–5.81)
RDW: 18.5 % — ABNORMAL HIGH (ref 11.5–15.5)
WBC: 10 10*3/uL (ref 4.0–10.5)
nRBC: 0.2 % (ref 0.0–0.2)

## 2023-07-23 LAB — GLUCOSE, CAPILLARY
Glucose-Capillary: 101 mg/dL — ABNORMAL HIGH (ref 70–99)
Glucose-Capillary: 127 mg/dL — ABNORMAL HIGH (ref 70–99)
Glucose-Capillary: 135 mg/dL — ABNORMAL HIGH (ref 70–99)
Glucose-Capillary: 189 mg/dL — ABNORMAL HIGH (ref 70–99)
Glucose-Capillary: 192 mg/dL — ABNORMAL HIGH (ref 70–99)
Glucose-Capillary: 202 mg/dL — ABNORMAL HIGH (ref 70–99)

## 2023-07-23 LAB — MAGNESIUM: Magnesium: 1.8 mg/dL (ref 1.7–2.4)

## 2023-07-23 NOTE — Progress Notes (Signed)
 PROGRESS NOTE    Nathan [name]  FMW:[id number] DOB: 06-09-1946 DOA: 07/14/2023 PCP: Yolande Toribio MATSU, MD   Brief Narrative:  78 year old male with history of cholecystitis (had percutaneous cholecystectomy drain placed on 03/02/2023), diabetes mellitus type 2, hyperlipidemia, hypertension, paroxysmal A-fib not on anticoagulation due to history of bleeding, chronic systolic CHF, CAD status post CABG and AVR, CKD stage IIIa, OSA presented with worsening abdominal pain.  CT of abdomen and pelvis showed inflammatory changes of gallbladder is consistent with acute cholecystitis.  He was initially admitted to ICU under PCCM service for septic shock secondary to acute cholecystitis and started on pressors and IV antibiotics.  General surgery was consulted.  His troponins trended up to 2264.  Cardiology was consulted.  He was treated with 48 hours of IV heparin .  He was also found to have AKI: Nephrology was consulted.  He was transferred to TRH service from 07/17/2023 onwards.  Assessment & Plan:   Septic shock: Present on admission E. coli bacteremia Acute on chronic calculus cholecystitis -Shock has resolved.  Pressors have been weaned off.  Transferred to TRH service from 07/17/2023 onwards -HIDA scan negative for obstruction: General Surgery recommends no need for any percutaneous cholecystostomy tube placement at this time.  LFTs improving.  Abdominal exam is improving -Continue IV Ancef . Repeat blood cultures have been negative so far  Acute on chronic systolic heart failure Permanent A-fib: Not on anticoagulation due to history of GI bleeding/duodenal ulcer perforation CAD with history of CABG and aortic stenosis status post AVR Demand ischemia/troponin elevation Hyperlipidemia -Limited echo showed EF of 35 to 40%.  Troponins elevated up to 2264.  Possibly from demand ischemia.  Treated with IV heparin  for 48 hours and switched to subcutaneous heparin .  Continue aspirin  and metoprolol .  Home  statin on hold because of elevated liver enzymes -Strict input and output.  Daily weights.  Fluid restriction. -GDMT limited by hypotension -Cardiology signed off on 07/22/2023  AKI on CKD stage IIIb Acute metabolic acidosis -Baseline creatinine of 1.7 from October 2024.  Creatinine peaked to 3.39.  Creatinine 1.33 today.  Nephrology signed off on 07/21/23. DC'd bicarb drip on 07/22/2023.  Monitor   Anemia of chronic disease -Hemoglobin stable.  Monitor. Transfuse if Hb<7  Elevated LFTs -Improving.  Monitor  Diabetes mellitus type 2 with hyperglycemia - Continue CBGs with SSI.  Leukocytosis -Resolved  Hypomagnesemia -Improved  Acute metabolic encephalopathy/delirium Probable dementia -Patient was agitated overnight on 07/19/23.  Probably has undiagnosed dementia which has gotten worse in the hospital because of delirium -Patient has been started on scheduled Seroquel  from 07/20/2023 evening onwards.  Use Haldol  as needed.  Fall precautions.  Monitor mental status.   -Palliative care following  DVT prophylaxis:  subcutaneous heparin  Code Status: DNR Family Communication: None at bedside Disposition Plan: Status is: Inpatient Remains inpatient appropriate because: Of severity of illness    Consultants: PCCM/General Surgery/cardiology/nephrology.  palliative care  Procedures: As above  Antimicrobials:  Anti-infectives (From admission, onward)    Start     Dose/Rate Route Frequency Ordered Stop   07/22/23 1000  ceFAZolin  (ANCEF ) IVPB 2g/100 mL premix        2 g 200 mL/hr over 30 Minutes Intravenous Every 8 hours 07/22/23 0729     07/20/23 1000  ceFAZolin  (ANCEF ) IVPB 2g/100 mL premix  Status:  Discontinued        2 g 200 mL/hr over 30 Minutes Intravenous Every 12 hours 07/20/23 0908 07/22/23 0729   07/18/23 2200  ceFAZolin  (ANCEF )  IVPB 1 g/50 mL premix  Status:  Discontinued        1 g 100 mL/hr over 30 Minutes Intravenous Every 12 hours 07/18/23 0754 07/20/23 0908    07/18/23 1000  ceFAZolin  (ANCEF ) IVPB 2g/100 mL premix        2 g 200 mL/hr over 30 Minutes Intravenous  Once 07/18/23 0754 07/18/23 1022   07/15/23 1300  cefTRIAXone  (ROCEPHIN ) 2 g in sodium chloride  0.9 % 100 mL IVPB  Status:  Discontinued        2 g 200 mL/hr over 30 Minutes Intravenous Every 24 hours 07/15/23 1122 07/18/23 0747   07/15/23 1000  linezolid  (ZYVOX ) IVPB 600 mg  Status:  Discontinued        600 mg 300 mL/hr over 60 Minutes Intravenous Every 12 hours 07/15/23 0217 07/15/23 1124   07/15/23 0600  piperacillin -tazobactam (ZOSYN ) IVPB 3.375 g  Status:  Discontinued        3.375 g 12.5 mL/hr over 240 Minutes Intravenous Every 8 hours 07/14/23 2230 07/15/23 1122   07/14/23 2215  metroNIDAZOLE  (FLAGYL ) IVPB 500 mg        500 mg 100 mL/hr over 60 Minutes Intravenous  Once 07/14/23 2208 07/14/23 2336   07/14/23 1945  vancomycin  (VANCOCIN ) IVPB 1000 mg/200 mL premix        1,000 mg 200 mL/hr over 60 Minutes Intravenous STAT 07/14/23 1937 07/14/23 2153   07/14/23 1915  ceFEPIme  (MAXIPIME ) 2 g in sodium chloride  0.9 % 100 mL IVPB        2 g 200 mL/hr over 30 Minutes Intravenous  Once 07/14/23 1906 07/14/23 2013        Subjective: Patient seen and examined at bedside.  Poor historian.  No agitation, fever, vomiting reported. Objective: Vitals:   07/22/23 1214 07/22/23 1932 07/23/23 0345 07/23/23 0354  BP: (!) 153/93 (!) 147/76 128/75   Pulse: 82 65 65   Resp: 17 18 19    Temp: (!) 97.5 F (36.4 C) 98 F (36.7 C) 97.8 F (36.6 C)   TempSrc: Oral     SpO2: 99% 98% 95%   Weight:    64.5 kg  Height:        Intake/Output Summary (Last 24 hours) at 07/23/2023 0706 Last data filed at 07/23/2023 0354 Gross per 24 hour  Intake 462.57 ml  Output 2500 ml  Net -2037.43 ml   Filed Weights   07/21/23 0703 07/22/23 0500 07/23/23 0354  Weight: 62 kg 62.7 kg 64.5 kg    Examination:  General: On room air.  No distress.  Chronically ill and deconditioned looking. ENT/neck: No  palpable thyromegaly or elevated JVD noted  respiratory: Decreased breath sounds at bases with scattered crackles  CVS: S1-S2 heard; rate currently controlled  abdominal: Soft, mildly tender and distended; no organomegaly, normal bowel sounds are heard  extremities: Mild lower extremity edema present; no cyanosis  CNS: Confused; very slow to respond.  Poor historian.  No focal deficits noted.  Lymph: No lymphadenopathy palpable  skin: No obvious ecchymosis/lesions psych: Currently not agitated.  Extremely flat affect.   Musculoskeletal: No obvious joint swelling/deformity   Data Reviewed: I have personally reviewed following labs and imaging studies  CBC: Recent Labs  Lab 07/18/23 0258 07/19/23 0255 07/21/23 0252 07/22/23 0323 07/23/23 0516  WBC 13.6* 10.1 9.2 8.6 10.0  NEUTROABS  --   --  6.5 6.4 7.1  HGB 9.6* 8.3* 8.6* 7.3* 8.3*  HCT 28.2* 22.9* 24.8* 20.8* 24.2*  MCV 84.4  80.1 83.8 82.9 84.9  PLT 286 272 224 175 178   Basic Metabolic Panel: Recent Labs  Lab 07/16/23 0801 07/17/23 0259 07/19/23 0255 07/20/23 0252 07/21/23 0252 07/22/23 0323 07/23/23 0516  NA 132*   < > 129* 136 137 136 137  K 4.4   < > 4.7 4.4 3.5 3.5 3.5  CL 100   < > 98 105 100 97* 100  CO2 15*   < > 17* 18* 26 31 28   GLUCOSE 112*   < > 118* 119* 109* 164* 131*  BUN 64*   < > 80* 70* 63* 47* 36*  CREATININE 2.98*   < > 3.18* 2.47* 2.24* 1.52* 1.33*  CALCIUM  8.7*   < > 8.7* 8.7* 8.6* 8.2* 8.5*  MG 2.4  --   --   --  1.8 1.6* 1.8   < > = values in this interval not displayed.   GFR: Estimated Creatinine Clearance: 41.8 mL/min (A) (by C-G formula based on SCr of 1.33 mg/dL (H)). Liver Function Tests: Recent Labs  Lab 07/19/23 0255 07/20/23 0252 07/21/23 0252 07/22/23 0323 07/23/23 0516  AST 715* 231* 202* 159* 148*  ALT 372* 123* 66* 26 19  ALKPHOS 131* 113 104 93 126  BILITOT 1.7* 1.3* 1.3* 0.8 0.9  PROT 5.9* 5.1* 5.4* 4.8* 5.2*  ALBUMIN 2.7* 2.4* 2.6* 2.3* 2.4*   No results for  input(s): LIPASE, AMYLASE in the last 168 hours.  No results for input(s): AMMONIA in the last 168 hours. Coagulation Profile: No results for input(s): INR, PROTIME in the last 168 hours.  Cardiac Enzymes: No results for input(s): CKTOTAL, CKMB, CKMBINDEX, TROPONINI in the last 168 hours. BNP (last 3 results) Recent Labs    04/20/23 1126  PROBNP 7,080*   HbA1C: No results for input(s): HGBA1C in the last 72 hours. CBG: Recent Labs  Lab 07/22/23 1132 07/22/23 1623 07/22/23 1935 07/22/23 2320 07/23/23 0342  GLUCAP 123* 195* 237* 190* 135*   Lipid Profile: No results for input(s): CHOL, HDL, LDLCALC, TRIG, CHOLHDL, LDLDIRECT in the last 72 hours.  Thyroid  Function Tests: No results for input(s): TSH, T4TOTAL, FREET4, T3FREE, THYROIDAB in the last 72 hours. Anemia Panel: No results for input(s): VITAMINB12, FOLATE, FERRITIN, TIBC, IRON, RETICCTPCT in the last 72 hours. Sepsis Labs: Recent Labs  Lab 07/16/23 0801  LATICACIDVEN 1.6    Recent Results (from the past 240 hours)  Blood culture (routine x 2)     Status: Abnormal   Collection Time: 07/14/23  6:47 PM   Specimen: BLOOD  Result Value Ref Range Status   Specimen Description   Final    BLOOD LEFT ANTECUBITAL Performed at [facility], [address], [place], [place] [address]    Special Requests   Final    BOTTLES DRAWN AEROBIC AND ANAEROBIC Blood Culture adequate volume Performed at [facility], [address] [place], [place] [address]    Culture  Setup Time   Final    GRAM NEGATIVE RODS IN BOTH AEROBIC AND ANAEROBIC BOTTLES CRITICAL RESULT CALLED TO, READ BACK BY AND VERIFIED WITH: [facility] SWAYNE [id number] AT 1105 BY EC Performed at [facility], [address], [place], [place] [address]    Culture ESCHERICHIA COLI (A)  Final   Report Status 07/17/2023 FINAL  Final   Organism ID, Bacteria ESCHERICHIA  COLI  Final   Organism ID, Bacteria ESCHERICHIA COLI  Final      Susceptibility   Escherichia coli -  KIRBY BAUER*    CEFAZOLIN  SENSITIVE Sensitive    Escherichia coli - MIC*    AMPICILLIN  <=2 SENSITIVE Sensitive     CEFEPIME  <=0.12 SENSITIVE Sensitive     CEFTAZIDIME <=1 SENSITIVE Sensitive     CEFTRIAXONE  <=0.25 SENSITIVE Sensitive     CIPROFLOXACIN <=0.25 SENSITIVE Sensitive     GENTAMICIN <=1 SENSITIVE Sensitive     IMIPENEM <=0.25 SENSITIVE Sensitive     TRIMETH/SULFA <=20 SENSITIVE Sensitive     AMPICILLIN /SULBACTAM <=2 SENSITIVE Sensitive     PIP/TAZO <=4 SENSITIVE Sensitive ug/mL    * ESCHERICHIA COLI    ESCHERICHIA COLI  Blood Culture ID Panel (Reflexed)     Status: Abnormal   Collection Time: 07/14/23  6:47 PM  Result Value Ref Range Status   Enterococcus faecalis NOT DETECTED NOT DETECTED Final   Enterococcus Faecium NOT DETECTED NOT DETECTED Final   Listeria monocytogenes NOT DETECTED NOT DETECTED Final   Staphylococcus species NOT DETECTED NOT DETECTED Final   Staphylococcus aureus (BCID) NOT DETECTED NOT DETECTED Final   Staphylococcus epidermidis NOT DETECTED NOT DETECTED Final   Staphylococcus lugdunensis NOT DETECTED NOT DETECTED Final   Streptococcus species NOT DETECTED NOT DETECTED Final   Streptococcus agalactiae NOT DETECTED NOT DETECTED Final   Streptococcus pneumoniae NOT DETECTED NOT DETECTED Final   Streptococcus pyogenes NOT DETECTED NOT DETECTED Final   A.calcoaceticus-baumannii NOT DETECTED NOT DETECTED Final   Bacteroides fragilis NOT DETECTED NOT DETECTED Final   Enterobacterales DETECTED (A) NOT DETECTED Final    Comment: Enterobacterales represent a large order of gram negative bacteria, not a single organism. CRITICAL RESULT CALLED TO, READ BACK BY AND VERIFIED WITH: PHARMD MARY SWAYNE [id number] AT 1105 BY EC    Enterobacter cloacae complex NOT DETECTED NOT DETECTED Final   Escherichia coli DETECTED (A) NOT DETECTED Final    Comment: CRITICAL  RESULT CALLED TO, READ BACK BY AND VERIFIED WITH: PHARMD MARY SWAYNE [id number] AT 1105 BY EC    Klebsiella aerogenes NOT DETECTED NOT DETECTED Final   Klebsiella oxytoca NOT DETECTED NOT DETECTED Final   Klebsiella pneumoniae NOT DETECTED NOT DETECTED Final   Proteus species NOT DETECTED NOT DETECTED Final   Salmonella species NOT DETECTED NOT DETECTED Final   Serratia marcescens NOT DETECTED NOT DETECTED Final   Haemophilus influenzae NOT DETECTED NOT DETECTED Final   Neisseria meningitidis NOT DETECTED NOT DETECTED Final   Pseudomonas aeruginosa NOT DETECTED NOT DETECTED Final   Stenotrophomonas maltophilia NOT DETECTED NOT DETECTED Final   Candida albicans NOT DETECTED NOT DETECTED Final   Candida auris NOT DETECTED NOT DETECTED Final   Candida glabrata NOT DETECTED NOT DETECTED Final   Candida krusei NOT DETECTED NOT DETECTED Final   Candida parapsilosis NOT DETECTED NOT DETECTED Final   Candida tropicalis NOT DETECTED NOT DETECTED Final   Cryptococcus neoformans/gattii NOT DETECTED NOT DETECTED Final   CTX-M ESBL NOT DETECTED NOT DETECTED Final   Carbapenem resistance IMP NOT DETECTED NOT DETECTED Final   Carbapenem resistance KPC NOT DETECTED NOT DETECTED Final   Carbapenem resistance NDM NOT DETECTED NOT DETECTED Final   Carbapenem resist OXA 48 LIKE NOT DETECTED NOT DETECTED Final   Carbapenem resistance VIM NOT DETECTED NOT DETECTED Final    Comment: Performed at [facility], [address] [place], [place] [address]  Resp panel by RT-PCR (RSV, Flu A&B, Covid) Anterior Nasal Swab     Status: None   Collection Time: 07/14/23  7:01 PM   Specimen: Anterior Nasal Swab  Result Value Ref Range Status   SARS Coronavirus 2 by RT PCR NEGATIVE NEGATIVE Final    Comment: (NOTE) SARS-CoV-2 target nucleic acids are NOT DETECTED.  The SARS-CoV-2 RNA is generally detectable in upper respiratory specimens during the acute phase of infection. The lowest concentration of SARS-CoV-2  viral copies this assay can detect is 138 copies/mL. A negative result does not preclude SARS-Cov-2 infection and should not be used as the sole basis for treatment or other patient management decisions. A negative result may occur with  improper specimen collection/handling, submission of specimen other than nasopharyngeal swab, presence of viral mutation(s) within the areas targeted by this assay, and inadequate number of viral copies(<138 copies/mL). A negative result must be combined with clinical observations, patient history, and epidemiological information. The expected result is Negative.  Fact Sheet for Patients:  bloggercourse.com  Fact Sheet for Healthcare Providers:  seriousbroker.it  This test is no t yet approved or cleared by the [place]  FDA and  has been authorized for detection and/or diagnosis of SARS-CoV-2 by FDA under an Emergency Use Authorization (EUA). This EUA will remain  in effect (meaning this test can be used) for the duration of the COVID-19 declaration under Section 564(b)(1) of the Act, 21 U.S.C.section 360bbb-3(b)(1), unless the authorization is terminated  or revoked sooner.       Influenza A by PCR NEGATIVE NEGATIVE Final   Influenza B by PCR NEGATIVE NEGATIVE Final    Comment: (NOTE) The Xpert Xpress SARS-CoV-2/FLU/RSV plus assay is intended as an aid in the diagnosis of influenza from Nasopharyngeal swab specimens and should not be used as a sole basis for treatment. Nasal washings and aspirates are unacceptable for Xpert Xpress SARS-CoV-2/FLU/RSV testing.  Fact Sheet for Patients: bloggercourse.com  Fact Sheet for Healthcare Providers: seriousbroker.it  This test is not yet approved or cleared by the [place]  FDA and has been authorized for detection and/or diagnosis of SARS-CoV-2 by FDA under an Emergency Use Authorization  (EUA). This EUA will remain in effect (meaning this test can be used) for the duration of the COVID-19 declaration under Section 564(b)(1) of the Act, 21 U.S.C. section 360bbb-3(b)(1), unless the authorization is terminated or revoked.     Resp Syncytial Virus by PCR NEGATIVE NEGATIVE Final    Comment: (NOTE) Fact Sheet for Patients: bloggercourse.com  Fact Sheet for Healthcare Providers: seriousbroker.it  This test is not yet approved or cleared by the [place]  FDA and has been authorized for detection and/or diagnosis of SARS-CoV-2 by FDA under an Emergency Use Authorization (EUA). This EUA will remain in effect (meaning this test can be used) for the duration of the COVID-19 declaration under Section 564(b)(1) of the Act, 21 U.S.C. section 360bbb-3(b)(1), unless the authorization is terminated or revoked.  Performed at [facility], [address], [place], [place] [address]   MRSA Next Gen by PCR, Nasal     Status: None   Collection Time: 07/15/23  2:09 AM   Specimen: Nasal Mucosa; Nasal Swab  Result Value Ref Range Status   MRSA by PCR Next Gen NOT DETECTED NOT DETECTED Final    Comment: (NOTE) The GeneXpert MRSA Assay (FDA approved for NASAL specimens only), is one component of a comprehensive MRSA colonization surveillance program. It is not intended to diagnose MRSA infection nor to guide or monitor treatment for MRSA infections. Test performance is not FDA approved in patients less than 63 years old. Performed at [facility], [address] [place],  [place] [address]   Blood culture (routine x 2)     Status: None   Collection Time: 07/15/23  2:57 AM   Specimen: BLOOD  Result Value Ref Range Status   Specimen Description   Final    BLOOD BLOOD RIGHT HAND AEROBIC BOTTLE ONLY Performed at [facility], [address], [place], [place] [address]     Special Requests   Final    BOTTLES DRAWN AEROBIC ONLY Blood Culture results may not be optimal due to an inadequate volume of blood received in culture bottles Performed at [facility], [address] [place], [place] [address]    Culture   Final    NO GROWTH 5 DAYS Performed at [facility], [address], [place], [place] [address]    Report Status 07/20/2023 FINAL  Final  Culture, blood (Routine X 2) w Reflex to ID Panel     Status: None   Collection Time: 07/17/23  2:59 AM   Specimen: BLOOD RIGHT HAND  Result Value Ref Range Status   Specimen Description   Final    BLOOD RIGHT HAND Performed at [facility], [address] [place], [place] [address]    Special Requests   Final    BOTTLES DRAWN AEROBIC ONLY Blood Culture results may not be optimal due to an inadequate volume of blood received in culture bottles Performed at [facility], [address] [place], [place] [address]    Culture   Final    NO GROWTH 5 DAYS Performed at [facility], [address], [place], [place] [address]    Report Status 07/22/2023 FINAL  Final  Culture, blood (Routine X 2) w Reflex to ID Panel     Status: None   Collection Time: 07/17/23  2:59 AM   Specimen: BLOOD RIGHT HAND  Result Value Ref Range Status   Specimen Description   Final    BLOOD RIGHT HAND Performed at [facility], [address] [place], [place] [address]    Special Requests   Final    BOTTLES DRAWN AEROBIC ONLY Blood Culture results may not be optimal due to an inadequate volume of blood received in culture bottles Performed at [facility], [address] [place], [place] [address]    Culture   Final    NO GROWTH 5 DAYS Performed at [facility], [address], [place], [place] [address]    Report Status 07/22/2023 FINAL  Final         Radiology Studies: No results found.      Scheduled Meds:  aspirin  EC  81 mg  Oral Daily   Chlorhexidine  Gluconate Cloth  6 each Topical Daily   heparin  injection (subcutaneous)  5,000 Units Subcutaneous Q8H   insulin  aspart  0-9 Units Subcutaneous Q4H   metoprolol  succinate  100 mg Oral Daily   polycarbophil  625 mg Oral BID   QUEtiapine   25 mg Oral QHS   Continuous Infusions:   ceFAZolin  (ANCEF ) IV 2 g (07/23/23 0135)          Sophie Mao, MD [facility] 07/23/2023, 7:06 AM

## 2023-07-23 NOTE — Progress Notes (Signed)
 Daily Progress Note   Patient Name: Nathan [name]       Date: 07/23/2023 DOB: 1945/09/22  Age: 78 y.o. MRN#: [id number] Attending Physician: Cheryle Page, MD Primary Care Physician: Yolande Toribio MATSU, MD Admit Date: 07/14/2023  Reason for Consultation/Follow-up: Establishing goals of care  Subjective: Awake alert, no distress, interacts appropriately, however is confused overall.   Length of Stay: 8  Current Medications: Scheduled Meds:   aspirin  EC  81 mg Oral Daily   Chlorhexidine  Gluconate Cloth  6 each Topical Daily   heparin  injection (subcutaneous)  5,000 Units Subcutaneous Q8H   insulin  aspart  0-9 Units Subcutaneous Q4H   metoprolol  succinate  100 mg Oral Daily   polycarbophil  625 mg Oral BID   QUEtiapine   25 mg Oral QHS    Continuous Infusions:   ceFAZolin  (ANCEF ) IV 2 g (07/23/23 0852)    PRN Meds: acetaminophen , acetaminophen , alum & mag hydroxide-simeth, docusate sodium , haloperidol  lactate, HYDROmorphone  (DILAUDID ) injection, magic mouthwash, ondansetron  (ZOFRAN ) IV, mouth rinse, polyethylene glycol, prochlorperazine , simethicone , sodium chloride , traMADol   Physical Exam         Awake alert Confused Not in acute distress No edema  Vital Signs: BP 128/75 (BP Location: Left Arm)   Pulse 65   Temp 97.8 F (36.6 C)   Resp 19   Ht 5' 7 (1.702 m)   Wt 64.5 kg   SpO2 95%   BMI 22.27 kg/m  SpO2: SpO2: 95 % O2 Device: O2 Device: Room Air O2 Flow Rate: O2 Flow Rate (L/min): 2 L/min  Intake/output summary:  Intake/Output Summary (Last 24 hours) at 07/23/2023 1338 Last data filed at 07/23/2023 1000 Gross per 24 hour  Intake 445 ml  Output 2800 ml  Net -2355 ml   LBM: Last BM Date : 07/21/23 Baseline Weight: Weight: 54.4 kg Most recent weight: Weight:  64.5 kg       Palliative Assessment/Data:      Patient Active Problem List   Diagnosis Date Noted   HFrEF (heart failure with reduced ejection fraction) (HCC) 07/22/2023   Chronic calculous cholecystitis 07/16/2023   Atrial fibrillation with RVR (HCC) 07/16/2023   Non-ST elevation (NSTEMI) myocardial infarction (HCC) 07/16/2023   Sepsis (HCC) 07/15/2023   S/P PICC central line placement 04/20/2023   Cholecystitis 04/20/2023   Medication management 04/20/2023  Protein-calorie malnutrition, severe 03/09/2023   Groin hematoma 03/03/2023   Acute cholecystitis 03/02/2023   Paroxysmal atrial fibrillation (HCC) 03/02/2023   Acute posthemorrhagic anemia 03/02/2023   Hypokalemia 03/02/2023   Acute kidney injury on CKD stage 3a, GFR 45-59 ml/min (HCC) 03/01/2023   Acute metabolic encephalopathy 02/28/2023   Myocardial infarction due to demand ischemia (HCC) 02/21/2023   Acute on chronic systolic CHF (congestive heart failure) (HCC) 02/21/2023   Acute respiratory failure with hypoxia (HCC) 02/21/2023   Abdominal pain 01/21/2023   Tachycardia 01/21/2023   Elevated LFTs 01/21/2023   Balance problem 04/26/2022   Neuropathic pain 04/26/2022   Primary open-angle glaucoma, bilateral, mild stage 08/16/2021   Pseudophakia, both eyes 08/16/2021   Moderate nonproliferative diabetic retinopathy of both eyes without macular edema associated with type 2 diabetes mellitus (HCC) 08/16/2021   Posterior vitreous detachment of both eyes 08/16/2021   DM (diabetes mellitus), secondary, with neurologic complications (HCC) 06/13/2017   Obesity (BMI 30-39.9) 03/15/2016   Abnormal nuclear stress test 03/01/2016    umbilical hernia repair open with Ventralex mesh Oct 2014 04/10/2013   Other general symptoms  01/06/2011   History of colonic polyps 01/06/2011   Iron deficiency anemia 01/06/2011   Gastritis, chronic 01/06/2011   Benign hypertensive heart disease without heart failure 10/13/2010   Hx of  CABG 10/13/2010   Systolic hypertension    Sinus bradycardia    CAD (coronary artery disease)    History of prosthetic aortic valve    History of diabetic retinopathy    History of necrotizing fasciitis    Postoperative anemia    Nightmares    B12 DEFICIENCY 12/02/2009   ESOPHAGEAL REFLUX 11/19/2009   CHEST PAIN 11/19/2009   BLOOD IN STOOL, OCCULT 11/19/2009   ANEMIA-IRON DEFICIENCY 08/19/2008   S/P aortic valve replacement with bioprosthetic valve 08/19/2008   Hemorrhage of gastrointestinal tract 08/19/2008   Type 2 diabetes mellitus with hyperlipidemia (HCC) 08/14/2008   Dyslipidemia 08/14/2008   Anxiety state 08/14/2008   OBSTRUCTIVE SLEEP APNEA 08/14/2008   Unspecified glaucoma 08/14/2008   Essential hypertension 08/14/2008   PHARYNGITIS 08/14/2008   Gastric ulcer 08/14/2008   Gastritis and gastroduodenitis 08/14/2008   Diverticulosis of colon 08/14/2008    Palliative Care Assessment & Plan   Patient Profile:    Assessment:  89-year-old gentleman lives at home with his wife, admitted with sepsis status post prior percutaneous cholecystostomy tube for cholecystitis.  Admitted for abdominal pain and sepsis.  Patient remains admitted to hospital medicine service.    Patient has serious illness of chronic acalculous cholecystitis.  Hospital course complicated by sepsis acute kidney injury and ongoing delirium.  Patient likely with underlying history of dementia.  Recommendations/Plan: Recommend outpatient palliative - either SNF rehab with palliative or home with palliative services on discharge.      Code Status:    Code Status Orders  (From admission, onward)           Start     Ordered   07/15/23 0041  Do not attempt resuscitation (DNR)- Limited -Do Not Intubate (DNI)  Continuous       Question Answer Comment  If pulseless and not breathing No CPR or chest compressions.   In Pre-Arrest Conditions (Patient Is Breathing and Has A Pulse) Do not intubate. Provide  all appropriate non-invasive medical interventions. Avoid ICU transfer unless indicated or required.   Consent: Discussion documented in EHR or advanced directives reviewed      07/15/23 [phone]  Code Status History     Date Active Date Inactive Code Status Order ID Comments User Context   03/04/2023 1222 03/13/2023 1830 DNR [id number]  Nathan Elidia Sieving, MD Inpatient   02/21/2023 0010 03/04/2023 1222 Full Code [id number]  Nathan Rima, MD ED   04/10/2013 1056 04/11/2013 1411 Full Code [id number]  Gladis Kid, MD Inpatient      Advance Directive Documentation    Flowsheet Row Most Recent Value  Type of Advance Directive Healthcare Power of Attorney, Living will  Pre-existing out of facility DNR order (yellow form or pink MOST form) --  MOST Form in Place? --       Prognosis:  Unable to determine  Discharge Planning: To Be Determined  Care plan was discussed with patient.   Thank you for allowing the Palliative Medicine Team to assist in the care of this patient.  Low MDM     Greater than 50%  of this time was spent counseling and coordinating care related to the above assessment and plan.  Lonia Serve, MD  Please contact Palliative Medicine Team phone at [phone] for questions and concerns.

## 2023-07-23 NOTE — Plan of Care (Signed)

## 2023-07-24 DIAGNOSIS — R6521 Severe sepsis with septic shock: Secondary | ICD-10-CM | POA: Diagnosis not present

## 2023-07-24 DIAGNOSIS — A4151 Sepsis due to Escherichia coli [E. coli]: Secondary | ICD-10-CM | POA: Diagnosis not present

## 2023-07-24 DIAGNOSIS — N170 Acute kidney failure with tubular necrosis: Secondary | ICD-10-CM | POA: Diagnosis not present

## 2023-07-24 LAB — CBC WITH DIFFERENTIAL/PLATELET
Abs Immature Granulocytes: 0.19 10*3/uL — ABNORMAL HIGH (ref 0.00–0.07)
Basophils Absolute: 0 10*3/uL (ref 0.0–0.1)
Basophils Relative: 0 %
Eosinophils Absolute: 0.2 10*3/uL (ref 0.0–0.5)
Eosinophils Relative: 2 %
HCT: 24.2 % — ABNORMAL LOW (ref 39.0–52.0)
Hemoglobin: 8.5 g/dL — ABNORMAL LOW (ref 13.0–17.0)
Immature Granulocytes: 2 %
Lymphocytes Relative: 15 %
Lymphs Abs: 1.5 10*3/uL (ref 0.7–4.0)
MCH: 29.8 pg (ref 26.0–34.0)
MCHC: 35.1 g/dL (ref 30.0–36.0)
MCV: 84.9 fL (ref 80.0–100.0)
Monocytes Absolute: 0.7 10*3/uL (ref 0.1–1.0)
Monocytes Relative: 7 %
Neutro Abs: 7.3 10*3/uL (ref 1.7–7.7)
Neutrophils Relative %: 74 %
Platelets: 191 10*3/uL (ref 150–400)
RBC: 2.85 MIL/uL — ABNORMAL LOW (ref 4.22–5.81)
RDW: 19.4 % — ABNORMAL HIGH (ref 11.5–15.5)
WBC: 10 10*3/uL (ref 4.0–10.5)
nRBC: 0 % (ref 0.0–0.2)

## 2023-07-24 LAB — GLUCOSE, CAPILLARY
Glucose-Capillary: 114 mg/dL — ABNORMAL HIGH (ref 70–99)
Glucose-Capillary: 103 mg/dL — ABNORMAL HIGH (ref 70–99)
Glucose-Capillary: 186 mg/dL — ABNORMAL HIGH (ref 70–99)
Glucose-Capillary: 161 mg/dL — ABNORMAL HIGH (ref 70–99)
Glucose-Capillary: 184 mg/dL — ABNORMAL HIGH (ref 70–99)
Glucose-Capillary: 108 mg/dL — ABNORMAL HIGH (ref 70–99)

## 2023-07-24 LAB — COMPREHENSIVE METABOLIC PANEL
ALT: 15 U/L (ref 0–44)
AST: 115 U/L — ABNORMAL HIGH (ref 15–41)
Albumin: 2.5 g/dL — ABNORMAL LOW (ref 3.5–5.0)
Alkaline Phosphatase: 161 U/L — ABNORMAL HIGH (ref 38–126)
Anion gap: 10 (ref 5–15)
BUN: 34 mg/dL — ABNORMAL HIGH (ref 8–23)
CO2: 28 mmol/L (ref 22–32)
Calcium: 8.8 mg/dL — ABNORMAL LOW (ref 8.9–10.3)
Chloride: 100 mmol/L (ref 98–111)
Creatinine, Ser: 1.7 mg/dL — ABNORMAL HIGH (ref 0.61–1.24)
GFR, Estimated: 41 mL/min — ABNORMAL LOW (ref >60)
Glucose, Bld: 118 mg/dL — ABNORMAL HIGH (ref 70–99)
Potassium: 3.8 mmol/L (ref 3.5–5.1)
Sodium: 138 mmol/L (ref 135–145)
Total Bilirubin: 0.9 mg/dL (ref 0.0–1.2)
Total Protein: 5.2 g/dL — ABNORMAL LOW (ref 6.5–8.1)

## 2023-07-24 LAB — MAGNESIUM: Magnesium: 1.7 mg/dL (ref 1.7–2.4)

## 2023-07-24 MED ORDER — QUETIAPINE FUMARATE 25 MG PO TABS: 25.0000 mg | ORAL_TABLET | Freq: Every evening | ORAL | Status: DC | PRN

## 2023-07-24 NOTE — Progress Notes (Signed)
 PROGRESS NOTE    Nathan [name]  FMW:[id number] DOB: 11-18-1945 DOA: 07/14/2023 PCP: Yolande Toribio MATSU, MD   Brief Narrative:  78 year old male with history of cholecystitis (had percutaneous cholecystectomy drain placed on 03/02/2023), diabetes mellitus type 2, hyperlipidemia, hypertension, paroxysmal A-fib not on anticoagulation due to history of bleeding, chronic systolic CHF, CAD status post CABG and AVR, CKD stage IIIa, OSA presented with worsening abdominal pain.  CT of abdomen and pelvis showed inflammatory changes of gallbladder is consistent with acute cholecystitis.  He was initially admitted to ICU under PCCM service for septic shock secondary to acute cholecystitis and started on pressors and IV antibiotics.  General surgery was consulted.  His troponins trended up to 2264.  Cardiology was consulted.  He was treated with 48 hours of IV heparin .  He was also found to have AKI: Nephrology was consulted.  He was transferred to TRH service from 07/17/2023 onwards.  Assessment & Plan:   Septic shock: Present on admission E. coli bacteremia Acute on chronic calculus cholecystitis -Shock has resolved.  Pressors have been weaned off.  Transferred to TRH service from 07/17/2023 onwards -HIDA scan negative for obstruction: General Surgery recommends no need for any percutaneous cholecystostomy tube placement at this time.  LFTs improving.  Abdominal exam is improving -Continue IV Ancef : Finish 10-day course of therapy. Repeat blood cultures have been negative so far -Currently afebrile and hemodynamically stable  Acute on chronic systolic heart failure Permanent A-fib: Not on anticoagulation due to history of GI bleeding/duodenal ulcer perforation CAD with history of CABG and aortic stenosis status post AVR Demand ischemia/troponin elevation Hyperlipidemia -Limited echo showed EF of 35 to 40%.  Troponins elevated up to 2264.  Possibly from demand ischemia.  Treated with IV heparin  for 48 hours  and switched to subcutaneous heparin .  Continue aspirin  and metoprolol .  Home statin on hold because of elevated liver enzymes -Strict input and output.  Daily weights.  Fluid restriction. -GDMT limited by hypotension -Cardiology signed off on 07/22/2023: Recommended to resume Imdur  when able and hydralazine  when blood pressure allows. -Blood pressure improving.  Resume Imdur .  AKI on CKD stage IIIb Acute metabolic acidosis -Baseline creatinine of 1.7 from October 2024.  Creatinine peaked to 3.39.  Creatinine 1.7 today.  Nephrology signed off on 07/21/23. DC'd bicarb drip on 07/22/2023.  Monitor   Anemia of chronic disease -Hemoglobin stable.  Monitor. Transfuse if Hb<7  Elevated LFTs -Improving.  Monitor  Diabetes mellitus type 2 with hyperglycemia - Continue CBGs with SSI.  Leukocytosis -Resolved  Hypomagnesemia -Improved  Acute metabolic encephalopathy/delirium Probable dementia -Patient was agitated overnight on 07/19/23.  Probably has undiagnosed dementia which has gotten worse in the hospital because of delirium -Patient has been started on scheduled Seroquel  from 07/20/2023 evening onwards.  Use Haldol  as needed.  Patient has not required Haldol  over the last few days and has remained sleepy during the day mostly.  Will DC scheduled Seroquel  and monitor.  Fall precautions.  Monitor mental status.   -Palliative care following and recommending outpatient palliative care follow-up  DVT prophylaxis:  subcutaneous heparin  Code Status: DNR Family Communication: Wife at bedside Disposition Plan: Status is: Inpatient Remains inpatient appropriate because: Of severity of illness    Consultants: PCCM/General Surgery/cardiology/nephrology.  palliative care  Procedures: As above  Antimicrobials:  Anti-infectives (From admission, onward)    Start     Dose/Rate Route Frequency Ordered Stop   07/22/23 1000  ceFAZolin  (ANCEF ) IVPB 2g/100 mL premix  2 g 200 mL/hr over 30  Minutes Intravenous Every 8 hours 07/22/23 0729     07/20/23 1000  ceFAZolin  (ANCEF ) IVPB 2g/100 mL premix  Status:  Discontinued        2 g 200 mL/hr over 30 Minutes Intravenous Every 12 hours 07/20/23 0908 07/22/23 0729   07/18/23 2200  ceFAZolin  (ANCEF ) IVPB 1 g/50 mL premix  Status:  Discontinued        1 g 100 mL/hr over 30 Minutes Intravenous Every 12 hours 07/18/23 0754 07/20/23 0908   07/18/23 1000  ceFAZolin  (ANCEF ) IVPB 2g/100 mL premix        2 g 200 mL/hr over 30 Minutes Intravenous  Once 07/18/23 0754 07/18/23 1022   07/15/23 1300  cefTRIAXone  (ROCEPHIN ) 2 g in sodium chloride  0.9 % 100 mL IVPB  Status:  Discontinued        2 g 200 mL/hr over 30 Minutes Intravenous Every 24 hours 07/15/23 1122 07/18/23 0747   07/15/23 1000  linezolid  (ZYVOX ) IVPB 600 mg  Status:  Discontinued        600 mg 300 mL/hr over 60 Minutes Intravenous Every 12 hours 07/15/23 0217 07/15/23 1124   07/15/23 0600  piperacillin -tazobactam (ZOSYN ) IVPB 3.375 g  Status:  Discontinued        3.375 g 12.5 mL/hr over 240 Minutes Intravenous Every 8 hours 07/14/23 2230 07/15/23 1122   07/14/23 2215  metroNIDAZOLE  (FLAGYL ) IVPB 500 mg        500 mg 100 mL/hr over 60 Minutes Intravenous  Once 07/14/23 2208 07/14/23 2336   07/14/23 1945  vancomycin  (VANCOCIN ) IVPB 1000 mg/200 mL premix        1,000 mg 200 mL/hr over 60 Minutes Intravenous STAT 07/14/23 1937 07/14/23 2153   07/14/23 1915  ceFEPIme  (MAXIPIME ) 2 g in sodium chloride  0.9 % 100 mL IVPB        2 g 200 mL/hr over 30 Minutes Intravenous  Once 07/14/23 1906 07/14/23 2013        Subjective: Patient seen and examined at bedside.  Poor historian.  No fever, agitation, vomiting reported.   Objective: Vitals:   07/23/23 1343 07/23/23 2023 07/24/23 0500 07/24/23 0559  BP: 124/68 133/86  136/74  Pulse: 69 73    Resp: 14 18  15   Temp: (!) 97.4 F (36.3 C) 98.1 F (36.7 C)  97.7 F (36.5 C)  TempSrc: Oral Oral  Oral  SpO2: 100% 98%  95%  Weight:    70.2 kg   Height:        Intake/Output Summary (Last 24 hours) at 07/24/2023 0729 Last data filed at 07/24/2023 0600 Gross per 24 hour  Intake 1075 ml  Output 3300 ml  Net -2225 ml   Filed Weights   07/22/23 0500 07/23/23 0354 07/24/23 0500  Weight: 62.7 kg 64.5 kg 70.2 kg    Examination:  General: No acute distress.  Currently on room air.  Chronically ill and deconditioned looking. ENT/neck: No JVD elevation or palpable neck masses noted respiratory: Bilateral decreased breath sounds at bases with some crackles  CVS: Rate mostly controlled; S1 and S2 are heard  abdominal: Soft, slightly distended and tender; no organomegaly, bowel sounds heard  extremities: No clubbing; trace lower extremity edema present CNS: More awake this morning.  Still very slow to respond.  Remains slightly confused.  Poor historian.  No obvious focal deficits noted.   Lymph: No obvious lymphadenopathy palpable  skin: No obvious rashes/petechiae psych: No signs of agitation.  Flat affect currently  musculoskeletal: No obvious joint tenderness/erythema  Data Reviewed: I have personally reviewed following labs and imaging studies  CBC: Recent Labs  Lab 07/19/23 0255 07/21/23 0252 07/22/23 0323 07/23/23 0516 07/24/23 0430  WBC 10.1 9.2 8.6 10.0 10.0  NEUTROABS  --  6.5 6.4 7.1 7.3  HGB 8.3* 8.6* 7.3* 8.3* 8.5*  HCT 22.9* 24.8* 20.8* 24.2* 24.2*  MCV 80.1 83.8 82.9 84.9 84.9  PLT 272 224 175 178 191   Basic Metabolic Panel: Recent Labs  Lab 07/20/23 0252 07/21/23 0252 07/22/23 0323 07/23/23 0516 07/24/23 0430  NA 136 137 136 137 138  K 4.4 3.5 3.5 3.5 3.8  CL 105 100 97* 100 100  CO2 18* 26 31 28 28   GLUCOSE 119* 109* 164* 131* 118*  BUN 70* 63* 47* 36* 34*  CREATININE 2.47* 2.24* 1.52* 1.33* 1.70*  CALCIUM  8.7* 8.6* 8.2* 8.5* 8.8*  MG  --  1.8 1.6* 1.8 1.7   GFR: Estimated Creatinine Clearance: 33.5 mL/min (A) (by C-G formula based on SCr of 1.7 mg/dL (H)). Liver Function  Tests: Recent Labs  Lab 07/20/23 0252 07/21/23 0252 07/22/23 0323 07/23/23 0516 07/24/23 0430  AST 231* 202* 159* 148* 115*  ALT 123* 66* 26 19 15   ALKPHOS 113 104 93 126 161*  BILITOT 1.3* 1.3* 0.8 0.9 0.9  PROT 5.1* 5.4* 4.8* 5.2* 5.2*  ALBUMIN 2.4* 2.6* 2.3* 2.4* 2.5*   No results for input(s): LIPASE, AMYLASE in the last 168 hours.  No results for input(s): AMMONIA in the last 168 hours. Coagulation Profile: No results for input(s): INR, PROTIME in the last 168 hours.  Cardiac Enzymes: No results for input(s): CKTOTAL, CKMB, CKMBINDEX, TROPONINI in the last 168 hours. BNP (last 3 results) Recent Labs    04/20/23 1126  PROBNP 7,080*   HbA1C: No results for input(s): HGBA1C in the last 72 hours. CBG: Recent Labs  Lab 07/23/23 1211 07/23/23 1604 07/23/23 2020 07/23/23 2349 07/24/23 0344  GLUCAP 202* 192* 189* 101* 114*   Lipid Profile: No results for input(s): CHOL, HDL, LDLCALC, TRIG, CHOLHDL, LDLDIRECT in the last 72 hours.  Thyroid  Function Tests: No results for input(s): TSH, T4TOTAL, FREET4, T3FREE, THYROIDAB in the last 72 hours. Anemia Panel: No results for input(s): VITAMINB12, FOLATE, FERRITIN, TIBC, IRON, RETICCTPCT in the last 72 hours. Sepsis Labs: No results for input(s): PROCALCITON, LATICACIDVEN in the last 168 hours.   Recent Results (from the past 240 hours)  Blood culture (routine x 2)     Status: Abnormal   Collection Time: 07/14/23  6:47 PM   Specimen: BLOOD  Result Value Ref Range Status   Specimen Description   Final    BLOOD LEFT ANTECUBITAL Performed at [facility], [address] [place], [place] [address]    Special Requests   Final    BOTTLES DRAWN AEROBIC AND ANAEROBIC Blood Culture adequate volume Performed at [facility], [address], [place], [place] [address]    Culture  Setup Time   Final    GRAM NEGATIVE RODS IN  BOTH AEROBIC AND ANAEROBIC BOTTLES CRITICAL RESULT CALLED TO, READ BACK BY AND VERIFIED WITH: [facility] SWAYNE [phone] AT 1105 BY EC Performed at [facility], [address], [place], [place] [address]    Culture ESCHERICHIA COLI (A)  Final   Report Status 07/17/2023 FINAL  Final   Organism ID, Bacteria ESCHERICHIA COLI  Final   Organism ID, Bacteria ESCHERICHIA COLI  Final  Susceptibility   Escherichia coli - KIRBY BAUER*    CEFAZOLIN  SENSITIVE Sensitive    Escherichia coli - MIC*    AMPICILLIN  <=2 SENSITIVE Sensitive     CEFEPIME  <=0.12 SENSITIVE Sensitive     CEFTAZIDIME <=1 SENSITIVE Sensitive     CEFTRIAXONE  <=0.25 SENSITIVE Sensitive     CIPROFLOXACIN <=0.25 SENSITIVE Sensitive     GENTAMICIN <=1 SENSITIVE Sensitive     IMIPENEM <=0.25 SENSITIVE Sensitive     TRIMETH/SULFA <=20 SENSITIVE Sensitive     AMPICILLIN /SULBACTAM <=2 SENSITIVE Sensitive     PIP/TAZO <=4 SENSITIVE Sensitive ug/mL    * ESCHERICHIA COLI    ESCHERICHIA COLI  Blood Culture ID Panel (Reflexed)     Status: Abnormal   Collection Time: 07/14/23  6:47 PM  Result Value Ref Range Status   Enterococcus faecalis NOT DETECTED NOT DETECTED Final   Enterococcus Faecium NOT DETECTED NOT DETECTED Final   Listeria monocytogenes NOT DETECTED NOT DETECTED Final   Staphylococcus species NOT DETECTED NOT DETECTED Final   Staphylococcus aureus (BCID) NOT DETECTED NOT DETECTED Final   Staphylococcus epidermidis NOT DETECTED NOT DETECTED Final   Staphylococcus lugdunensis NOT DETECTED NOT DETECTED Final   Streptococcus species NOT DETECTED NOT DETECTED Final   Streptococcus agalactiae NOT DETECTED NOT DETECTED Final   Streptococcus pneumoniae NOT DETECTED NOT DETECTED Final   Streptococcus pyogenes NOT DETECTED NOT DETECTED Final   A.calcoaceticus-baumannii NOT DETECTED NOT DETECTED Final   Bacteroides fragilis NOT DETECTED NOT DETECTED Final   Enterobacterales DETECTED (A) NOT DETECTED Final    Comment:  Enterobacterales represent a large order of gram negative bacteria, not a single organism. CRITICAL RESULT CALLED TO, READ BACK BY AND VERIFIED WITH: PHARMD MARY SWAYNE [phone] AT 1105 BY EC    Enterobacter cloacae complex NOT DETECTED NOT DETECTED Final   Escherichia coli DETECTED (A) NOT DETECTED Final    Comment: CRITICAL RESULT CALLED TO, READ BACK BY AND VERIFIED WITH: PHARMD MARY SWAYNE [phone] AT 1105 BY EC    Klebsiella aerogenes NOT DETECTED NOT DETECTED Final   Klebsiella oxytoca NOT DETECTED NOT DETECTED Final   Klebsiella pneumoniae NOT DETECTED NOT DETECTED Final   Proteus species NOT DETECTED NOT DETECTED Final   Salmonella species NOT DETECTED NOT DETECTED Final   Serratia marcescens NOT DETECTED NOT DETECTED Final   Haemophilus influenzae NOT DETECTED NOT DETECTED Final   Neisseria meningitidis NOT DETECTED NOT DETECTED Final   Pseudomonas aeruginosa NOT DETECTED NOT DETECTED Final   Stenotrophomonas maltophilia NOT DETECTED NOT DETECTED Final   Candida albicans NOT DETECTED NOT DETECTED Final   Candida auris NOT DETECTED NOT DETECTED Final   Candida glabrata NOT DETECTED NOT DETECTED Final   Candida krusei NOT DETECTED NOT DETECTED Final   Candida parapsilosis NOT DETECTED NOT DETECTED Final   Candida tropicalis NOT DETECTED NOT DETECTED Final   Cryptococcus neoformans/gattii NOT DETECTED NOT DETECTED Final   CTX-M ESBL NOT DETECTED NOT DETECTED Final   Carbapenem resistance IMP NOT DETECTED NOT DETECTED Final   Carbapenem resistance KPC NOT DETECTED NOT DETECTED Final   Carbapenem resistance NDM NOT DETECTED NOT DETECTED Final   Carbapenem resist OXA 48 LIKE NOT DETECTED NOT DETECTED Final   Carbapenem resistance VIM NOT DETECTED NOT DETECTED Final    Comment: Performed at [facility], [address], [place], [place] [address]  Resp panel by RT-PCR (RSV, Flu A&B, Covid) Anterior Nasal Swab     Status: None   Collection Time: 07/14/23  7:01 PM  Specimen:  Anterior Nasal Swab  Result Value Ref Range Status   SARS Coronavirus 2 by RT PCR NEGATIVE NEGATIVE Final    Comment: (NOTE) SARS-CoV-2 target nucleic acids are NOT DETECTED.  The SARS-CoV-2 RNA is generally detectable in upper respiratory specimens during the acute phase of infection. The lowest concentration of SARS-CoV-2 viral copies this assay can detect is 138 copies/mL. A negative result does not preclude SARS-Cov-2 infection and should not be used as the sole basis for treatment or other patient management decisions. A negative result may occur with  improper specimen collection/handling, submission of specimen other than nasopharyngeal swab, presence of viral mutation(s) within the areas targeted by this assay, and inadequate number of viral copies(<138 copies/mL). A negative result must be combined with clinical observations, patient history, and epidemiological information. The expected result is Negative.  Fact Sheet for Patients:  bloggercourse.com  Fact Sheet for Healthcare Providers:  seriousbroker.it  This test is no t yet approved or cleared by the [place]  FDA and  has been authorized for detection and/or diagnosis of SARS-CoV-2 by FDA under an Emergency Use Authorization (EUA). This EUA will remain  in effect (meaning this test can be used) for the duration of the COVID-19 declaration under Section 564(b)(1) of the Act, 21 U.S.C.section 360bbb-3(b)(1), unless the authorization is terminated  or revoked sooner.       Influenza A by PCR NEGATIVE NEGATIVE Final   Influenza B by PCR NEGATIVE NEGATIVE Final    Comment: (NOTE) The Xpert Xpress SARS-CoV-2/FLU/RSV plus assay is intended as an aid in the diagnosis of influenza from Nasopharyngeal swab specimens and should not be used as a sole basis for treatment. Nasal washings and aspirates are unacceptable for Xpert Xpress SARS-CoV-2/FLU/RSV testing.  Fact  Sheet for Patients: bloggercourse.com  Fact Sheet for Healthcare Providers: seriousbroker.it  This test is not yet approved or cleared by the [place]  FDA and has been authorized for detection and/or diagnosis of SARS-CoV-2 by FDA under an Emergency Use Authorization (EUA). This EUA will remain in effect (meaning this test can be used) for the duration of the COVID-19 declaration under Section 564(b)(1) of the Act, 21 U.S.C. section 360bbb-3(b)(1), unless the authorization is terminated or revoked.     Resp Syncytial Virus by PCR NEGATIVE NEGATIVE Final    Comment: (NOTE) Fact Sheet for Patients: bloggercourse.com  Fact Sheet for Healthcare Providers: seriousbroker.it  This test is not yet approved or cleared by the [place]  FDA and has been authorized for detection and/or diagnosis of SARS-CoV-2 by FDA under an Emergency Use Authorization (EUA). This EUA will remain in effect (meaning this test can be used) for the duration of the COVID-19 declaration under Section 564(b)(1) of the Act, 21 U.S.C. section 360bbb-3(b)(1), unless the authorization is terminated or revoked.  Performed at [facility], [address], [place], [place] [address]   MRSA Next Gen by PCR, Nasal     Status: None   Collection Time: 07/15/23  2:09 AM   Specimen: Nasal Mucosa; Nasal Swab  Result Value Ref Range Status   MRSA by PCR Next Gen NOT DETECTED NOT DETECTED Final    Comment: (NOTE) The GeneXpert MRSA Assay (FDA approved for NASAL specimens only), is one component of a comprehensive MRSA colonization surveillance program. It is not intended to diagnose MRSA infection nor to guide or monitor treatment for MRSA infections. Test performance is not FDA approved in patients less than 47 years old. Performed at [facility],  [address] [place], [place] [address]   Blood culture (routine x 2)     Status: None   Collection Time: 07/15/23  2:57 AM   Specimen: BLOOD  Result Value Ref Range Status   Specimen Description   Final    BLOOD BLOOD RIGHT HAND AEROBIC BOTTLE ONLY Performed at [facility], [address] [place], [place] [address]    Special Requests   Final    BOTTLES DRAWN AEROBIC ONLY Blood Culture results may not be optimal due to an inadequate volume of blood received in culture bottles Performed at [facility], [address] [place], [place] [address]    Culture   Final    NO GROWTH 5 DAYS Performed at [facility], [address] [place], [place] [address]    Report Status 07/20/2023 FINAL  Final  Culture, blood (Routine X 2) w Reflex to ID Panel     Status: None   Collection Time: 07/17/23  2:59 AM   Specimen: BLOOD RIGHT HAND  Result Value Ref Range Status   Specimen Description   Final    BLOOD RIGHT HAND Performed at [facility], [address] [place], [place] [address]    Special Requests   Final    BOTTLES DRAWN AEROBIC ONLY Blood Culture results may not be optimal due to an inadequate volume of blood received in culture bottles Performed at [facility], [address], [place], [place] [address]    Culture   Final    NO GROWTH 5 DAYS Performed at [facility], [address], [place], [place] [address]    Report Status 07/22/2023 FINAL  Final  Culture, blood (Routine X 2) w Reflex to ID Panel     Status: None   Collection Time: 07/17/23  2:59 AM   Specimen: BLOOD RIGHT HAND  Result Value Ref Range Status   Specimen Description   Final    BLOOD RIGHT HAND Performed at [facility], [address], [place], [place] [address]    Special Requests   Final    BOTTLES DRAWN AEROBIC ONLY Blood Culture results may not be optimal due to an inadequate volume of blood received in culture bottles Performed  at [facility], [address], [place], [place] [address]    Culture   Final    NO GROWTH 5 DAYS Performed at [facility], [address] [place], [place] [address]    Report Status 07/22/2023 FINAL  Final         Radiology Studies: No results found.      Scheduled Meds:  aspirin  EC  81 mg Oral Daily   Chlorhexidine  Gluconate Cloth  6 each Topical Daily   heparin  injection (subcutaneous)  5,000 Units Subcutaneous Q8H   insulin  aspart  0-9 Units Subcutaneous Q4H   metoprolol  succinate  100 mg Oral Daily   polycarbophil  625 mg Oral BID   QUEtiapine   25 mg Oral QHS   Continuous Infusions:   ceFAZolin  (ANCEF ) IV 2 g (07/24/23 0111)          Sophie Mao, MD [facility] 07/24/2023, 7:29 AM

## 2023-07-24 NOTE — Evaluation (Signed)
 Physical Therapy Evaluation Patient Details Name: Nathan [name] MRN: [id number] DOB: [birth year] Today's Date: 07/24/2023  History of Present Illness  Pt is a 78 y.o. male admitted 1/4 with septic shock 2/2 acute cholecystitis, started on pressors and IV antibiotics. PMH significant for DM Type II, prior percutaneous cholecystostomy tube for cholecystitis, HTN, paroxysmal A-fib not on anticoagulation, CHF, CAD s/p CABG and AVR, CKD stage IIIa, OSA, elevated LFTs, anemia, acute metabolic encephalopathy/delirium  Clinical Impression  Pt admitted with above diagnosis.  Pt currently with functional limitations due to the deficits listed below (see PT Problem List). Pt will benefit from acute skilled PT to increase their independence and safety with mobility to allow discharge.     The patient's wife present to provide more details. Patient has been driving and independent in  ADL's and mobility, using a Cane PRN. Patient has been in [facility] for rehab.  Patient does present with  impaired orientation and tangential  thoughts  unrelated to conversation.  .Patient will benefit from continued inpatient follow up therapy, <3 hours/day unless progresses to safe ambulation and  MS is clear.        If plan is discharge home, recommend the following: A little help with walking and/or transfers;A little help with bathing/dressing/bathroom;Assistance with cooking/housework;Assist for transportation;Help with stairs or ramp for entrance   Can travel by private vehicle    yes    Equipment Recommendations None recommended by PT  Recommendations for Other Services       Functional Status Assessment Patient has had a recent decline in their functional status and demonstrates the ability to make significant improvements in function in a reasonable and predictable amount of time.     Precautions / Restrictions Precautions Precautions: Fall Restrictions Weight Bearing Restrictions Per Provider Order:  No      Mobility  Bed Mobility   Bed Mobility: Supine to Sit, Sit to Supine Rolling: Contact guard assist   Supine to sit: Min assist Sit to supine: Supervision   General bed mobility comments: increased time    Transfers Overall transfer level: Needs assistance Equipment used: Rolling walker (2 wheels) Transfers: Sit to/from Stand Sit to Stand: Min assist           General transfer comment: posterior bias standing at Rw    Ambulation/Gait Ambulation/Gait assistance: Min assist Gait Distance (Feet): 20 Feet (then 10) Assistive device: Rolling walker (2 wheels) Gait Pattern/deviations: Step-through pattern Gait velocity: decr     General Gait Details: multimodal cues for hand placement  Stairs            Wheelchair Mobility     Tilt Bed    Modified Rankin (Stroke Patients Only)       Balance Overall balance assessment: Needs assistance Sitting-balance support: Feet supported, Bilateral upper extremity supported Sitting balance-Leahy Scale: Fair     Standing balance support: Bilateral upper extremity supported, No upper extremity supported Standing balance-Leahy Scale: Fair Standing balance comment: standing to wash hands at sink                             Pertinent Vitals/Pain Pain Assessment Pain Assessment: No/denies pain    Home Living Family/patient expects to be discharged to:: Private residence Living Arrangements: Spouse/significant other Available Help at Discharge: Family;Available 24 hours/day Type of Home: House Home Access: Stairs to enter Entrance Stairs-Rails: None Entrance Stairs-Number of Steps: 2 at back   Home Layout: One level Home Equipment: Grab  bars - tub/shower;Shower Counsellor (2 wheels)      Prior Function Prior Level of Function : Independent/Modified Independent;Patient poor historian/Family not available             Mobility Comments: uses a cane mostly ADLs Comments:  Independent with ADLs, says he drives but spouse does not like it     Extremity/Trunk Assessment   Upper Extremity Assessment Upper Extremity Assessment: Generalized weakness    Lower Extremity Assessment Lower Extremity Assessment: Generalized weakness    Cervical / Trunk Assessment Cervical / Trunk Assessment: Normal  Communication   Communication Communication: Hearing impairment  Cognition Arousal: Alert Behavior During Therapy: WFL for tasks assessed/performed Overall Cognitive Status: Impaired/Different from baseline Area of Impairment: Orientation, Attention, Memory, Awareness                 Orientation Level: Time, Situation Current Attention Level: Sustained Memory: Decreased short-term memory     Awareness: Emergent   General Comments: O to [facility],  states the beds were made for aliens,   off the wal  comments        General Comments General comments (skin integrity, edema, etc.): telesitter + mitts on at end of session    Exercises     Assessment/Plan    PT Assessment Patient needs continued PT services  PT Problem List Decreased strength;Decreased balance;Decreased cognition;Decreased knowledge of precautions;Decreased mobility;Decreased activity tolerance;Decreased safety awareness       PT Treatment Interventions DME instruction;Therapeutic activities;Cognitive remediation;Gait training;Therapeutic exercise;Patient/family education;Functional mobility training;Balance training    PT Goals (Current goals can be found in the Care Plan section)  Acute Rehab PT Goals Patient Stated Goal: to go home PT Goal Formulation: With patient/family Time For Goal Achievement: 08/07/23 Potential to Achieve Goals: Good    Frequency Min 1X/week     Co-evaluation               AM-PAC PT 6 Clicks Mobility  Outcome Measure Help needed turning from your back to your side while in a flat bed without using bedrails?: A Little Help needed moving from  lying on your back to sitting on the side of a flat bed without using bedrails?: A Little Help needed moving to and from a bed to a chair (including a wheelchair)?: A Little Help needed standing up from a chair using your arms (e.g., wheelchair or bedside chair)?: A Little Help needed to walk in hospital room?: A Lot Help needed climbing 3-5 steps with a railing? : A Lot 6 Click Score: 16    End of Session Equipment Utilized During Treatment: Gait belt Activity Tolerance: Patient tolerated treatment well Patient left: in bed;with bed alarm set;with family/visitor present Nurse Communication: Mobility status PT Visit Diagnosis: Unsteadiness on feet (R26.81);Muscle weakness (generalized) (M62.81);Difficulty in walking, not elsewhere classified (R26.2)    Time: 1450-1505 PT Time Calculation (min) (ACUTE ONLY): 15 min   Charges:   PT Evaluation $PT Eval Low Complexity: 1 Low   PT General Charges $$ ACUTE PT VISIT: 1 Visit         Darice Potters PT Acute Rehabilitation Services Office [phone] Weekend [phone]   Potters Darice Norris 07/24/2023, 3:19 PM

## 2023-07-24 NOTE — Plan of Care (Signed)
   Problem: Fluid Volume: Goal: Ability to maintain a balanced intake and output will improve Outcome: Progressing

## 2023-07-24 NOTE — Progress Notes (Addendum)
 Subjective/Chief Complaint: No change   Objective: Vital signs in last 24 hours: Temp:  [97.4 F (36.3 C)-98.1 F (36.7 C)] 97.7 F (36.5 C) (01/13 0559) Pulse Rate:  [69-73] 73 (01/12 2023) Resp:  [14-18] 15 (01/13 0559) BP: (124-136)/(68-86) 136/74 (01/13 0559) SpO2:  [95 %-100 %] 95 % (01/13 0559) Weight:  [70.2 kg] 70.2 kg (01/13 0500) Last BM Date : 07/24/23  Intake/Output from previous day: 01/12 0701 - 01/13 0700 In: 1075 [P.O.:820; IV Piggyback:255] Out: 3300 [Urine:3300] Intake/Output this shift: No intake/output data recorded.    Lab Results:  Recent Labs    07/23/23 0516 07/24/23 0430  WBC 10.0 10.0  HGB 8.3* 8.5*  HCT 24.2* 24.2*  PLT 178 191   BMET Recent Labs    07/23/23 0516 07/24/23 0430  NA 137 138  K 3.5 3.8  CL 100 100  CO2 28 28  GLUCOSE 131* 118*  BUN 36* 34*  CREATININE 1.33* 1.70*  CALCIUM  8.5* 8.8*   PT/INR No results for input(s): LABPROT, INR in the last 72 hours. ABG No results for input(s): PHART, HCO3 in the last 72 hours.  Invalid input(s): PCO2, PO2  Studies/Results: No results found.  Anti-infectives: Anti-infectives (From admission, onward)    Start     Dose/Rate Route Frequency Ordered Stop   07/22/23 1000  ceFAZolin  (ANCEF ) IVPB 2g/100 mL premix        2 g 200 mL/hr over 30 Minutes Intravenous Every 8 hours 07/22/23 0729     07/20/23 1000  ceFAZolin  (ANCEF ) IVPB 2g/100 mL premix  Status:  Discontinued        2 g 200 mL/hr over 30 Minutes Intravenous Every 12 hours 07/20/23 0908 07/22/23 0729   07/18/23 2200  ceFAZolin  (ANCEF ) IVPB 1 g/50 mL premix  Status:  Discontinued        1 g 100 mL/hr over 30 Minutes Intravenous Every 12 hours 07/18/23 0754 07/20/23 0908   07/18/23 1000  ceFAZolin  (ANCEF ) IVPB 2g/100 mL premix        2 g 200 mL/hr over 30 Minutes Intravenous  Once 07/18/23 0754 07/18/23 1022   07/15/23 1300  cefTRIAXone  (ROCEPHIN ) 2 g in sodium chloride  0.9 % 100 mL IVPB  Status:   Discontinued        2 g 200 mL/hr over 30 Minutes Intravenous Every 24 hours 07/15/23 1122 07/18/23 0747   07/15/23 1000  linezolid  (ZYVOX ) IVPB 600 mg  Status:  Discontinued        600 mg 300 mL/hr over 60 Minutes Intravenous Every 12 hours 07/15/23 0217 07/15/23 1124   07/15/23 0600  piperacillin -tazobactam (ZOSYN ) IVPB 3.375 g  Status:  Discontinued        3.375 g 12.5 mL/hr over 240 Minutes Intravenous Every 8 hours 07/14/23 2230 07/15/23 1122   07/14/23 2215  metroNIDAZOLE  (FLAGYL ) IVPB 500 mg        500 mg 100 mL/hr over 60 Minutes Intravenous  Once 07/14/23 2208 07/14/23 2336   07/14/23 1945  vancomycin  (VANCOCIN ) IVPB 1000 mg/200 mL premix        1,000 mg 200 mL/hr over 60 Minutes Intravenous STAT 07/14/23 1937 07/14/23 2153   07/14/23 1915  ceFEPIme  (MAXIPIME ) 2 g in sodium chloride  0.9 % 100 mL IVPB        2 g 200 mL/hr over 30 Minutes Intravenous  Once 07/14/23 1906 07/14/23 2013       Assessment/Plan: Patient with sepsis, multiple medical problems, prior percutaneous cholecystostomy tube for cholecystitis.  HIDA scan shows delayed filling of the gallbladder consistent with chronic cholecystitis   -will sign off, if has more issues needs another tube  I reviewed hospitalist notes, last 24 h vitals and pain scores, last 48 h intake and output, last 24 h labs and trends, and last 24 h imaging results.    Donnice Bury 07/24/2023

## 2023-07-24 NOTE — Evaluation (Signed)
 Occupational Therapy Evaluation Patient Details Name: Nathan [name] MRN: [id number] DOB: Dec 09, 1945 Today's Date: 07/24/2023   History of Present Illness Pt is a 78 y.o. male admitted 1/4 with septic shock 2/2 acute cholecystitis, started on pressors and IV antibiotics. PMH significant for DM Type II, prior percutaneous cholecystostomy tube for cholecystitis, HTN, paroxysmal A-fib not on anticoagulation, CHF, CAD s/p CABG and AVR, CKD stage IIIa, OSA, elevated LFTs, anemia, acute metabolic encephalopathy/delirium   Clinical Impression   Pt generally confused, alert only to self but able to follow multi-step commands consistently. Unsure of accuracy of PLOF + home setup.  Presents with generalized weakness, impaired cognition, balance and awareness of deficits. Requires minA for bed mobility, and is able to tolerate sitting EOB for ~2 mins before fatiguing. Scoots laterally toward HOB with min-modA. Anticipate +2 needed for further OOB mobility due to weakness, maxA for LB ADLs and minA for UB ADLs. Pt remains with telesitter and mitts for safety.  Pt would benefit from skilled OT services to address noted impairments and functional limitations (see below for any additional details) in order to maximize safety and independence while minimizing falls risk and caregiver burden. Patient will benefit from continued inpatient follow up therapy, <3 hours/day.       If plan is discharge home, recommend the following: Two people to help with walking and/or transfers;A lot of help with bathing/dressing/bathroom;Assistance with cooking/housework;Direct supervision/assist for medications management;Direct supervision/assist for financial management;Assist for transportation;Help with stairs or ramp for entrance;Supervision due to cognitive status    Functional Status Assessment  Patient has had a recent decline in their functional status and demonstrates the ability to make significant improvements in  function in a reasonable and predictable amount of time.  Equipment Recommendations  None recommended by OT (defer)    Recommendations for Other Services       Precautions / Restrictions Precautions Precautions: Fall Restrictions Weight Bearing Restrictions Per Provider Order: No      Mobility Bed Mobility Overal bed mobility: Needs Assistance Bed Mobility: Rolling, Sidelying to Sit, Sit to Sidelying Rolling: Contact guard assist Sidelying to sit: Min assist     Sit to sidelying: Min assist General bed mobility comments: increased time    Transfers                   General transfer comment: NT due to fatigue/lethargy      Balance Overall balance assessment: Needs assistance Sitting-balance support: Feet supported, Bilateral upper extremity supported Sitting balance-Leahy Scale: Fair         Standing balance comment: NT                           ADL either performed or assessed with clinical judgement   ADL Overall ADL's : Needs assistance/impaired Eating/Feeding: Modified independent;Sitting   Grooming: Wash/dry face;Wash/dry hands;Bed level;Set up Grooming Details (indicate cue type and reason): follows multistep commands to wash face Upper Body Bathing: Sitting;Minimal assistance   Lower Body Bathing: Maximal assistance;Bed level   Upper Body Dressing : Minimal assistance;Sitting   Lower Body Dressing: Bed level;Maximal assistance   Toilet Transfer: +2 for safety/equipment;Moderate assistance;BSC/3in1 Statistician Details (indicate cue type and reason): clinical judgement Toileting- Clothing Manipulation and Hygiene: Maximal assistance;Bed level;Sit to/from stand       Functional mobility during ADLs: Rolling walker (2 wheels);+2 for physical assistance;Moderate assistance General ADL Comments: Eval limited by fatigue/lethargy. Pt generally confused, but able to follow multi-step commands. Washes  face bed level with setup, sits  EOB with minA and increased time before spontanously returning to sidelying. Transfers not assessed due to fatigue/lethargy.     Vision Baseline Vision/History: 1 Wears glasses;4 Cataracts Ability to See in Adequate Light: 0 Adequate              Pertinent Vitals/Pain Pain Assessment Pain Assessment: No/denies pain     Extremity/Trunk Assessment Upper Extremity Assessment Upper Extremity Assessment: Generalized weakness   Lower Extremity Assessment Lower Extremity Assessment: Generalized weakness       Communication Communication Communication: Hearing impairment   Cognition Arousal: Lethargic Behavior During Therapy: WFL for tasks assessed/performed Overall Cognitive Status: No family/caregiver present to determine baseline cognitive functioning                                 General Comments: pt alert to self only, follows multi-step commands consistently     General Comments  telesitter + mitts on at end of session            Home Living Family/patient expects to be discharged to:: Private residence Living Arrangements: Spouse/significant other Available Help at Discharge: Family;Available 24 hours/day Type of Home: House Home Access: Stairs to enter [employer] of Steps: 2 at back Entrance Stairs-Rails: None Home Layout: One level     Bathroom Shower/Tub: Chief Strategy Officer: Standard     Home Equipment: Grab bars - tub/shower;Shower seat          Prior Functioning/Environment Prior Level of Function : Independent/Modified Independent;Driving;Patient poor historian/Family not available             Mobility Comments: SPC ADLs Comments: Independent with ADLs, says he drives but spouse does not like it        OT Problem List: Decreased strength;Decreased range of motion;Decreased activity tolerance;Impaired balance (sitting and/or standing);Impaired vision/perception;Decreased cognition;Decreased  safety awareness;Decreased knowledge of use of DME or AE      OT Treatment/Interventions: Self-care/ADL training;Therapeutic exercise;Energy conservation;DME and/or AE instruction;Therapeutic activities;Cognitive remediation/compensation;Balance training;Patient/family education    OT Goals(Current goals can be found in the care plan section) Acute Rehab OT Goals OT Goal Formulation: Patient unable to participate in goal setting Time For Goal Achievement: 08/07/23 Potential to Achieve Goals: Fair  OT Frequency: Min 1X/week       AM-PAC OT 6 Clicks Daily Activity     Outcome Measure Help from another person eating meals?: None Help from another person taking care of personal grooming?: None Help from another person toileting, which includes using toliet, bedpan, or urinal?: Total Help from another person bathing (including washing, rinsing, drying)?: Total Help from another person to put on and taking off regular upper body clothing?: Total Help from another person to put on and taking off regular lower body clothing?: Total 6 Click Score: 12   End of Session Nurse Communication: Mobility status  Activity Tolerance: Patient limited by fatigue;Patient limited by lethargy Patient left: with nursing/sitter in room;with restraints reapplied;in bed;with call bell/phone within reach;with bed alarm set (mitts on)  OT Visit Diagnosis: Other abnormalities of gait and mobility (R26.89);Muscle weakness (generalized) (M62.81);Unsteadiness on feet (R26.81)                Time: 8661-8598 OT Time Calculation (min): 23 min Charges:  OT General Charges $OT Visit: 1 Visit OT Evaluation $OT Eval Low Complexity: 1 Low Aison Malveaux L. Jenaro Souder, OTR/L  07/24/23, 2:25 PM

## 2023-07-25 DIAGNOSIS — N170 Acute kidney failure with tubular necrosis: Secondary | ICD-10-CM | POA: Diagnosis not present

## 2023-07-25 DIAGNOSIS — R6521 Severe sepsis with septic shock: Secondary | ICD-10-CM | POA: Diagnosis not present

## 2023-07-25 DIAGNOSIS — A4151 Sepsis due to Escherichia coli [E. coli]: Secondary | ICD-10-CM | POA: Diagnosis not present

## 2023-07-25 LAB — COMPREHENSIVE METABOLIC PANEL
ALT: 16 U/L (ref 0–44)
AST: 125 U/L — ABNORMAL HIGH (ref 15–41)
Albumin: 2.5 g/dL — ABNORMAL LOW (ref 3.5–5.0)
Alkaline Phosphatase: 263 U/L — ABNORMAL HIGH (ref 38–126)
Anion gap: 9 (ref 5–15)
BUN: 29 mg/dL — ABNORMAL HIGH (ref 8–23)
CO2: 25 mmol/L (ref 22–32)
Calcium: 8.7 mg/dL — ABNORMAL LOW (ref 8.9–10.3)
Chloride: 101 mmol/L (ref 98–111)
Creatinine, Ser: 1.47 mg/dL — ABNORMAL HIGH (ref 0.61–1.24)
GFR, Estimated: 49 mL/min — ABNORMAL LOW (ref >60)
Glucose, Bld: 101 mg/dL — ABNORMAL HIGH (ref 70–99)
Potassium: 3.6 mmol/L (ref 3.5–5.1)
Sodium: 135 mmol/L (ref 135–145)
Total Bilirubin: 0.8 mg/dL (ref 0.0–1.2)
Total Protein: 5.4 g/dL — ABNORMAL LOW (ref 6.5–8.1)

## 2023-07-25 LAB — CBC WITH DIFFERENTIAL/PLATELET
Abs Immature Granulocytes: 0.17 10*3/uL — ABNORMAL HIGH (ref 0.00–0.07)
Basophils Absolute: 0.1 10*3/uL (ref 0.0–0.1)
Basophils Relative: 1 %
Eosinophils Absolute: 0.2 10*3/uL (ref 0.0–0.5)
Eosinophils Relative: 2 %
HCT: 27.2 % — ABNORMAL LOW (ref 39.0–52.0)
Hemoglobin: 9 g/dL — ABNORMAL LOW (ref 13.0–17.0)
Immature Granulocytes: 2 %
Lymphocytes Relative: 16 %
Lymphs Abs: 1.6 10*3/uL (ref 0.7–4.0)
MCH: 28.8 pg (ref 26.0–34.0)
MCHC: 33.1 g/dL (ref 30.0–36.0)
MCV: 87.2 fL (ref 80.0–100.0)
Monocytes Absolute: 0.6 10*3/uL (ref 0.1–1.0)
Monocytes Relative: 6 %
Neutro Abs: 7.3 10*3/uL (ref 1.7–7.7)
Neutrophils Relative %: 73 %
Platelets: 188 10*3/uL (ref 150–400)
RBC: 3.12 MIL/uL — ABNORMAL LOW (ref 4.22–5.81)
RDW: 20 % — ABNORMAL HIGH (ref 11.5–15.5)
WBC: 10 10*3/uL (ref 4.0–10.5)
nRBC: 0 % (ref 0.0–0.2)

## 2023-07-25 LAB — GLUCOSE, CAPILLARY
Glucose-Capillary: 77 mg/dL (ref 70–99)
Glucose-Capillary: 100 mg/dL — ABNORMAL HIGH (ref 70–99)
Glucose-Capillary: 180 mg/dL — ABNORMAL HIGH (ref 70–99)
Glucose-Capillary: 182 mg/dL — ABNORMAL HIGH (ref 70–99)
Glucose-Capillary: 212 mg/dL — ABNORMAL HIGH (ref 70–99)
Glucose-Capillary: 117 mg/dL — ABNORMAL HIGH (ref 70–99)

## 2023-07-25 LAB — MAGNESIUM: Magnesium: 1.6 mg/dL — ABNORMAL LOW (ref 1.7–2.4)

## 2023-07-25 MED ORDER — ISOSORBIDE MONONITRATE ER 30 MG PO TB24
30.0000 mg | ORAL_TABLET | Freq: Every day | ORAL | Status: DC
  Administered 2023-07-25 – 2023-07-26 (×2): 30 mg via ORAL
  Filled 2023-07-25 (×2): qty 1

## 2023-07-25 MED ORDER — LATANOPROST 0.005 % OP SOLN
1.0000 [drp] | Freq: Every day | OPHTHALMIC | Status: DC
  Administered 2023-07-25: 1 [drp] via OPHTHALMIC
  Filled 2023-07-25: qty 2.5

## 2023-07-25 NOTE — Progress Notes (Signed)
 Mobility Specialist - Progress Note   07/25/23 1301  Mobility  Activity Ambulated with assistance in hallway  Level of Assistance Contact guard assist, steadying assist  Assistive Device Front wheel walker  Distance Ambulated (ft) 250 ft  Activity Response Tolerated well  Mobility Referral Yes  Mobility visit 1 Mobility  Mobility Specialist Start Time (ACUTE ONLY) 1241  Mobility Specialist Stop Time (ACUTE ONLY) 1301  Mobility Specialist Time Calculation (min) (ACUTE ONLY) 20 min   Pt received in bed and agreeable to mobility. No complaints during session. Pt to bed after session with all needs met.     [facility]

## 2023-07-25 NOTE — Plan of Care (Signed)
  Problem: Clinical Measurements: Goal: Diagnostic test results will improve Outcome: Progressing   Problem: Coping: Goal: Level of anxiety will decrease Outcome: Progressing   Problem: Safety: Goal: Ability to remain free from injury will improve Outcome: Progressing   

## 2023-07-25 NOTE — TOC Progression Note (Addendum)
 Transition of Care [facility]) - Progression Note    Patient Details  Name: Nathan [name] MRN: [id number] Date of Birth: 16-May-1946  Transition of Care [facility]) CM/SW Contact  Alfonse JONELLE Rex, RN Phone Number: 07/25/2023, 11:18 AM  Clinical Narrative:  PT eval completed, recommendation for short term rehab. Call to pt's spouse, [name], vm left with NCM name and phone number requesting call back.     -11:26am Call received from pt's spouse, Barnie, introduced self and role of TOC/NCM, reviewed PT recommendation for short term rehab, agreeable, prefers [facility], faxed out for bed offers.   -2:25pm Call to pt's dtr , Barnie, to review short term rehab bed offer list ( [facility], [facility], [facility], [address], [place], [place]), accepted bed offer at [facility], rep-Rhonda notified, inquired if bed available for transfer tomorrow, await response.     Expected Discharge Plan: Home/Self Care Barriers to Discharge: Continued Medical Work up  Expected Discharge Plan and Services   Discharge Planning Services: CM Consult   Living arrangements for the past 2 months: Single Family Home                                       Social Determinants of Health (SDOH) Interventions SDOH Screenings   Food Insecurity: No Food Insecurity (07/21/2023)  Housing: Low Risk  (07/21/2023)  Transportation Needs: No Transportation Needs (07/21/2023)  Utilities: Not At Risk (07/21/2023)  Depression (PHQ2-9): Low Risk  (04/20/2023)  Social Connections: Moderately Isolated (07/15/2023)  Tobacco Use: Low Risk  (07/14/2023)    Readmission Risk Interventions    07/21/2023    2:33 PM 02/22/2023    2:58 PM  Readmission Risk Prevention Plan  Transportation Screening Complete Complete  HRI or Home Care Consult  Complete  Social Work Consult for Recovery Care Planning/Counseling  Complete  Palliative Care Screening  Not Applicable  Medication Review Furniture Conservator/restorer) Complete Referral to Pharmacy  HRI or Home Care Consult Complete   SW Recovery Care/Counseling Consult Complete   Palliative Care Screening Complete   Skilled Nursing Facility Not Applicable Complete

## 2023-07-25 NOTE — Plan of Care (Signed)

## 2023-07-25 NOTE — Progress Notes (Signed)
 PROGRESS NOTE    Nathan [name]  FMW:[id number] DOB: 1946/07/11 DOA: 07/14/2023 PCP: Yolande Toribio MATSU, MD   Brief Narrative:  78 year old male with history of cholecystitis (had percutaneous cholecystectomy drain placed on 03/02/2023), diabetes mellitus type 2, hyperlipidemia, hypertension, paroxysmal A-fib not on anticoagulation due to history of bleeding, chronic systolic CHF, CAD status post CABG and AVR, CKD stage IIIa, OSA presented with worsening abdominal pain.  CT of abdomen and pelvis showed inflammatory changes of gallbladder is consistent with acute cholecystitis.  He was initially admitted to ICU under PCCM service for septic shock secondary to acute cholecystitis and started on pressors and IV antibiotics.  General surgery was consulted.  His troponins trended up to 2264.  Cardiology was consulted.  He was treated with 48 hours of IV heparin .  He was also found to have AKI: Nephrology was consulted.  He was transferred to TR[facility] service from 07/17/2023 onwards.  Subsequently, his kidney function has improved: General surgery has signed off.  Assessment & Plan:   Septic shock: Present on admission E. coli bacteremia Acute on chronic calculus cholecystitis -Shock has resolved.  Pressors have been weaned off.  Transferred to TR[facility] service from 07/17/2023 onwards -HIDA scan negative for obstruction: General Surgery recommended no need for any percutaneous cholecystostomy tube placement at this time.  General surgery signed off on 07/24/2023 LFTs improving.  Abdominal exam is much improved. -Completed 10-day course of IV antibiotics.  Antibiotics discontinued on 07/24/2023.  Repeat blood cultures have been negative so far -Currently afebrile and hemodynamically stable  Acute on chronic systolic heart failure Permanent A-fib: Not on anticoagulation due to history of GI bleeding/duodenal ulcer perforation CAD with history of CABG and aortic stenosis status post AVR Demand ischemia/troponin  elevation Hyperlipidemia -Limited echo showed EF of 35 to 40%.  Troponins elevated up to 2264.  Possibly from demand ischemia.  Treated with IV heparin  for 48 hours and switched to subcutaneous heparin .  Continue aspirin  and metoprolol .  Home statin on hold because of elevated liver enzymes -Strict input and output.  Daily weights.  Fluid restriction. -GDMT limited by hypotension -Cardiology signed off on 07/22/2023: Recommended to resume Imdur  when able and hydralazine  when blood pressure allows. -Blood pressure improving.  Resume Imdur .  AKI on CKD stage IIIb Acute metabolic acidosis -Baseline creatinine of 1.7 from October 2024.  Creatinine peaked to 3.39.  Creatinine 1.47 today.  Nephrology signed off on 07/21/23. DC'd bicarb drip on 07/22/2023.  Monitor   Anemia of chronic disease -Hemoglobin stable.  Monitor. Transfuse if Hb<7  Elevated LFTs -Improving.  Monitor  Diabetes mellitus type 2 with hyperglycemia - Continue CBGs with SSI.  Leukocytosis -Resolved  Hypomagnesemia -Improved  Acute metabolic encephalopathy/delirium Probable dementia -Patient was agitated overnight on 07/19/23.  Probably has undiagnosed dementia which has gotten worse in the hospital because of delirium -Patient has been started on scheduled Seroquel  from 07/20/2023 evening onwards.  Use Haldol  as needed.  Patient has not required Haldol  over the last several days.  Scheduled Seroquel  was discontinued on 07/24/2023.  Fall precautions.  Monitor mental status.   -Palliative care following and recommending outpatient palliative care follow-up  Physical deconditioning -PT recommending SNF placement.  Consult TOC.  DVT prophylaxis:  subcutaneous heparin  Code Status: DNR Family Communication: Wife at bedside on 07/23/2022.  None at bedside today. Disposition Plan: Status is: Inpatient Remains inpatient appropriate because: Of severity of illness    Consultants: PCCM/General Surgery/cardiology/nephrology.   palliative care  Procedures: As above  Antimicrobials:  Anti-infectives (  From admission, onward)    Start     Dose/Rate Route Frequency Ordered Stop   07/22/23 1000  ceFAZolin  (ANCEF ) IVPB 2g/100 mL premix        2 g 200 mL/hr over 30 Minutes Intravenous Every 8 hours 07/22/23 0729 07/24/23 1806   07/20/23 1000  ceFAZolin  (ANCEF ) IVPB 2g/100 mL premix  Status:  Discontinued        2 g 200 mL/hr over 30 Minutes Intravenous Every 12 hours 07/20/23 0908 07/22/23 0729   07/18/23 2200  ceFAZolin  (ANCEF ) IVPB 1 g/50 mL premix  Status:  Discontinued        1 g 100 mL/hr over 30 Minutes Intravenous Every 12 hours 07/18/23 0754 07/20/23 0908   07/18/23 1000  ceFAZolin  (ANCEF ) IVPB 2g/100 mL premix        2 g 200 mL/hr over 30 Minutes Intravenous  Once 07/18/23 0754 07/18/23 1022   07/15/23 1300  cefTRIAXone  (ROCEPHIN ) 2 g in sodium chloride  0.9 % 100 mL IVPB  Status:  Discontinued        2 g 200 mL/hr over 30 Minutes Intravenous Every 24 hours 07/15/23 1122 07/18/23 0747   07/15/23 1000  linezolid  (ZYVOX ) IVPB 600 mg  Status:  Discontinued        600 mg 300 mL/hr over 60 Minutes Intravenous Every 12 hours 07/15/23 0217 07/15/23 1124   07/15/23 0600  piperacillin -tazobactam (ZOSYN ) IVPB 3.375 g  Status:  Discontinued        3.375 g 12.5 mL/hr over 240 Minutes Intravenous Every 8 hours 07/14/23 2230 07/15/23 1122   07/14/23 2215  metroNIDAZOLE  (FLAGYL ) IVPB 500 mg        500 mg 100 mL/hr over 60 Minutes Intravenous  Once 07/14/23 2208 07/14/23 2336   07/14/23 1945  vancomycin  (VANCOCIN ) IVPB 1000 mg/200 mL premix        1,000 mg 200 mL/hr over 60 Minutes Intravenous STAT 07/14/23 1937 07/14/23 2153   07/14/23 1915  ceFEPIme  (MAXIPIME ) 2 g in sodium chloride  0.9 % 100 mL IVPB        2 g 200 mL/hr over 30 Minutes Intravenous  Once 07/14/23 1906 07/14/23 2013        Subjective: Patient seen and examined at bedside.  Poor historian.  No agitation, fever, seizures or vomiting reported.    Objective: Vitals:   07/24/23 2011 07/25/23 0500 07/25/23 0549 07/25/23 0553  BP: (!) 132/94  (!) 149/73 131/63  Pulse: 60  (!) 58 (!) 59  Resp:   16   Temp:   (!) 97.4 F (36.3 C)   TempSrc:   Oral   SpO2:   98%   Weight:  57.7 kg    Height:        Intake/Output Summary (Last 24 hours) at 07/25/2023 0731 Last data filed at 07/25/2023 9392 Gross per 24 hour  Intake 1065.6 ml  Output 2350 ml  Net -1284.4 ml   Filed Weights   07/23/23 0354 07/24/23 0500 07/25/23 0500  Weight: 64.5 kg 70.2 kg 57.7 kg    Examination:  General: Remains on room air and in no distress.  Chronically ill and deconditioned looking. ENT/neck: No obvious thyromegaly or JVD elevation noted  respiratory: Decreased breath sounds at bases bilaterally with scattered crackles CVS: Mild intermittent bradycardia present; S1-S2 heard  abdominal: Soft, distended mildly; nontender; no organomegaly, bowel sounds heard normally extremities: Mild lower extremity edema present; no cyanosis  CNS: Wakes up only very slightly; still slow to respond and  slightly confused.  Poor historian.  No focal deficits noted  lymph: No palpable lymphadenopathy noted  skin: No obvious lesions/ecchymosis  psych: Mostly flat affect.  Showing no signs of agitation currently musculoskeletal: No obvious joint deformity/swelling  Data Reviewed: I have personally reviewed following labs and imaging studies  CBC: Recent Labs  Lab 07/21/23 0252 07/22/23 0323 07/23/23 0516 07/24/23 0430 07/25/23 0455  WBC 9.2 8.6 10.0 10.0 10.0  NEUTROABS 6.5 6.4 7.1 7.3 7.3  HGB 8.6* 7.3* 8.3* 8.5* 9.0*  HCT 24.8* 20.8* 24.2* 24.2* 27.2*  MCV 83.8 82.9 84.9 84.9 87.2  PLT 224 175 178 191 188   Basic Metabolic Panel: Recent Labs  Lab 07/21/23 0252 07/22/23 0323 07/23/23 0516 07/24/23 0430 07/25/23 0455  NA 137 136 137 138 135  K 3.5 3.5 3.5 3.8 3.6  CL 100 97* 100 100 101  CO2 26 31 28 28 25   GLUCOSE 109* 164* 131* 118* 101*  BUN 63*  47* 36* 34* 29*  CREATININE 2.24* 1.52* 1.33* 1.70* 1.47*  CALCIUM  8.6* 8.2* 8.5* 8.8* 8.7*  MG 1.8 1.6* 1.8 1.7 1.6*   GFR: Estimated Creatinine Clearance: 33.8 mL/min (A) (by C-G formula based on SCr of 1.47 mg/dL (H)). Liver Function Tests: Recent Labs  Lab 07/21/23 0252 07/22/23 0323 07/23/23 0516 07/24/23 0430 07/25/23 0455  AST 202* 159* 148* 115* 125*  ALT 66* 26 19 15 16   ALKPHOS 104 93 126 161* 263*  BILITOT 1.3* 0.8 0.9 0.9 0.8  PROT 5.4* 4.8* 5.2* 5.2* 5.4*  ALBUMIN 2.6* 2.3* 2.4* 2.5* 2.5*   No results for input(s): LIPASE, AMYLASE in the last 168 hours.  No results for input(s): AMMONIA in the last 168 hours. Coagulation Profile: No results for input(s): INR, PROTIME in the last 168 hours.  Cardiac Enzymes: No results for input(s): CKTOTAL, CKMB, CKMBINDEX, TROPONINI in the last 168 hours. BNP (last 3 results) Recent Labs    04/20/23 1126  PROBNP 7,080*   HbA1C: No results for input(s): HGBA1C in the last 72 hours. CBG: Recent Labs  Lab 07/24/23 1614 07/24/23 2002 07/24/23 2259 07/25/23 0359 07/25/23 0728  GLUCAP 161* 184* 108* 77 100*   Lipid Profile: No results for input(s): CHOL, HDL, LDLCALC, TRIG, CHOLHDL, LDLDIRECT in the last 72 hours.  Thyroid  Function Tests: No results for input(s): TSH, T4TOTAL, FREET4, T3FREE, THYROIDAB in the last 72 hours. Anemia Panel: No results for input(s): VITAMINB12, FOLATE, FERRITIN, TIBC, IRON, RETICCTPCT in the last 72 hours. Sepsis Labs: No results for input(s): PROCALCITON, LATICACIDVEN in the last 168 hours.   Recent Results (from the past 240 hours)  Culture, blood (Routine X 2) w Reflex to ID Panel     Status: None   Collection Time: 07/17/23  2:59 AM   Specimen: BLOOD RIGHT HAND  Result Value Ref Range Status   Specimen Description   Final    BLOOD RIGHT HAND Performed at [facility], [address] [place], [place] [address]     Special Requests   Final    BOTTLES DRAWN AEROBIC ONLY Blood Culture results may not be optimal due to an inadequate volume of blood received in culture bottles Performed at [facility], [address] [place], [place] [address]    Culture   Final    NO GROWTH 5 DAYS Performed at [facility], [address], [place], [place] [address]    Report Status 07/22/2023 FINAL  Final  Culture, blood (Routine X 2) w  Reflex to ID Panel     Status: None   Collection Time: 07/17/23  2:59 AM   Specimen: BLOOD RIGHT HAND  Result Value Ref Range Status   Specimen Description   Final    BLOOD RIGHT HAND Performed at [facility], [address], [place], [place] [address]    Special Requests   Final    BOTTLES DRAWN AEROBIC ONLY Blood Culture results may not be optimal due to an inadequate volume of blood received in culture bottles Performed at [facility], [address] [place], [place] [address]    Culture   Final    NO GROWTH 5 DAYS Performed at [facility], [address] [place], [place] [address]    Report Status 07/22/2023 FINAL  Final         Radiology Studies: No results found.      Scheduled Meds:  aspirin  EC  81 mg Oral Daily   Chlorhexidine  Gluconate Cloth  6 each Topical Daily   heparin  injection (subcutaneous)  5,000 Units Subcutaneous Q8H   insulin  aspart  0-9 Units Subcutaneous Q4H   metoprolol  succinate  100 mg Oral Daily   polycarbophil  625 mg Oral BID   Continuous Infusions:          Sophie Mao, MD [facility] 07/25/2023, 7:31 AM

## 2023-07-26 DIAGNOSIS — R41841 Cognitive communication deficit: Secondary | ICD-10-CM | POA: Diagnosis not present

## 2023-07-26 DIAGNOSIS — I5020 Unspecified systolic (congestive) heart failure: Secondary | ICD-10-CM | POA: Diagnosis not present

## 2023-07-26 DIAGNOSIS — E43 Unspecified severe protein-calorie malnutrition: Secondary | ICD-10-CM | POA: Diagnosis not present

## 2023-07-26 DIAGNOSIS — E119 Type 2 diabetes mellitus without complications: Secondary | ICD-10-CM | POA: Diagnosis not present

## 2023-07-26 DIAGNOSIS — R000 Tachycardia, unspecified: Secondary | ICD-10-CM | POA: Diagnosis not present

## 2023-07-26 DIAGNOSIS — I10 Essential (primary) hypertension: Secondary | ICD-10-CM | POA: Diagnosis not present

## 2023-07-26 DIAGNOSIS — E113393 Type 2 diabetes mellitus with moderate nonproliferative diabetic retinopathy without macular edema, bilateral: Secondary | ICD-10-CM | POA: Diagnosis not present

## 2023-07-26 DIAGNOSIS — R531 Weakness: Secondary | ICD-10-CM | POA: Diagnosis not present

## 2023-07-26 DIAGNOSIS — G4733 Obstructive sleep apnea (adult) (pediatric): Secondary | ICD-10-CM | POA: Diagnosis not present

## 2023-07-26 DIAGNOSIS — R7989 Other specified abnormal findings of blood chemistry: Secondary | ICD-10-CM | POA: Diagnosis not present

## 2023-07-26 DIAGNOSIS — J9601 Acute respiratory failure with hypoxia: Secondary | ICD-10-CM | POA: Diagnosis not present

## 2023-07-26 DIAGNOSIS — I2510 Atherosclerotic heart disease of native coronary artery without angina pectoris: Secondary | ICD-10-CM | POA: Diagnosis not present

## 2023-07-26 DIAGNOSIS — M6281 Muscle weakness (generalized): Secondary | ICD-10-CM | POA: Diagnosis not present

## 2023-07-26 DIAGNOSIS — E785 Hyperlipidemia, unspecified: Secondary | ICD-10-CM | POA: Diagnosis not present

## 2023-07-26 DIAGNOSIS — Z953 Presence of xenogenic heart valve: Secondary | ICD-10-CM | POA: Diagnosis not present

## 2023-07-26 DIAGNOSIS — N170 Acute kidney failure with tubular necrosis: Secondary | ICD-10-CM | POA: Diagnosis not present

## 2023-07-26 DIAGNOSIS — R2689 Other abnormalities of gait and mobility: Secondary | ICD-10-CM | POA: Diagnosis not present

## 2023-07-26 DIAGNOSIS — M1380 Other specified arthritis, unspecified site: Secondary | ICD-10-CM | POA: Diagnosis not present

## 2023-07-26 DIAGNOSIS — R6521 Severe sepsis with septic shock: Secondary | ICD-10-CM | POA: Diagnosis not present

## 2023-07-26 DIAGNOSIS — Z951 Presence of aortocoronary bypass graft: Secondary | ICD-10-CM | POA: Diagnosis not present

## 2023-07-26 DIAGNOSIS — Z7401 Bed confinement status: Secondary | ICD-10-CM | POA: Diagnosis not present

## 2023-07-26 DIAGNOSIS — N179 Acute kidney failure, unspecified: Secondary | ICD-10-CM | POA: Diagnosis not present

## 2023-07-26 DIAGNOSIS — Z9181 History of falling: Secondary | ICD-10-CM | POA: Diagnosis not present

## 2023-07-26 DIAGNOSIS — L309 Dermatitis, unspecified: Secondary | ICD-10-CM | POA: Diagnosis not present

## 2023-07-26 DIAGNOSIS — G9341 Metabolic encephalopathy: Secondary | ICD-10-CM | POA: Diagnosis not present

## 2023-07-26 DIAGNOSIS — K219 Gastro-esophageal reflux disease without esophagitis: Secondary | ICD-10-CM | POA: Diagnosis not present

## 2023-07-26 DIAGNOSIS — K810 Acute cholecystitis: Secondary | ICD-10-CM | POA: Diagnosis not present

## 2023-07-26 DIAGNOSIS — F419 Anxiety disorder, unspecified: Secondary | ICD-10-CM | POA: Diagnosis not present

## 2023-07-26 DIAGNOSIS — I5022 Chronic systolic (congestive) heart failure: Secondary | ICD-10-CM | POA: Diagnosis not present

## 2023-07-26 DIAGNOSIS — E1349 Other specified diabetes mellitus with other diabetic neurological complication: Secondary | ICD-10-CM | POA: Diagnosis not present

## 2023-07-26 DIAGNOSIS — E1169 Type 2 diabetes mellitus with other specified complication: Secondary | ICD-10-CM | POA: Diagnosis not present

## 2023-07-26 DIAGNOSIS — C434 Malignant melanoma of scalp and neck: Secondary | ICD-10-CM | POA: Diagnosis not present

## 2023-07-26 DIAGNOSIS — R262 Difficulty in walking, not elsewhere classified: Secondary | ICD-10-CM | POA: Diagnosis not present

## 2023-07-26 DIAGNOSIS — A4151 Sepsis due to Escherichia coli [E. coli]: Secondary | ICD-10-CM | POA: Diagnosis not present

## 2023-07-26 DIAGNOSIS — Z515 Encounter for palliative care: Secondary | ICD-10-CM | POA: Diagnosis not present

## 2023-07-26 DIAGNOSIS — H401131 Primary open-angle glaucoma, bilateral, mild stage: Secondary | ICD-10-CM | POA: Diagnosis not present

## 2023-07-26 DIAGNOSIS — F411 Generalized anxiety disorder: Secondary | ICD-10-CM | POA: Diagnosis not present

## 2023-07-26 DIAGNOSIS — I119 Hypertensive heart disease without heart failure: Secondary | ICD-10-CM | POA: Diagnosis not present

## 2023-07-26 LAB — COMPREHENSIVE METABOLIC PANEL
ALT: 18 U/L (ref 0–44)
AST: 94 U/L — ABNORMAL HIGH (ref 15–41)
Albumin: 2.4 g/dL — ABNORMAL LOW (ref 3.5–5.0)
Alkaline Phosphatase: 284 U/L — ABNORMAL HIGH (ref 38–126)
Anion gap: 6 (ref 5–15)
BUN: 28 mg/dL — ABNORMAL HIGH (ref 8–23)
CO2: 27 mmol/L (ref 22–32)
Calcium: 8.7 mg/dL — ABNORMAL LOW (ref 8.9–10.3)
Chloride: 103 mmol/L (ref 98–111)
Creatinine, Ser: 1.36 mg/dL — ABNORMAL HIGH (ref 0.61–1.24)
GFR, Estimated: 53 mL/min — ABNORMAL LOW (ref >60)
Glucose, Bld: 88 mg/dL (ref 70–99)
Potassium: 3.6 mmol/L (ref 3.5–5.1)
Sodium: 136 mmol/L (ref 135–145)
Total Bilirubin: 0.8 mg/dL (ref 0.0–1.2)
Total Protein: 5.2 g/dL — ABNORMAL LOW (ref 6.5–8.1)

## 2023-07-26 LAB — CBC WITH DIFFERENTIAL/PLATELET
Abs Immature Granulocytes: 0.13 10*3/uL — ABNORMAL HIGH (ref 0.00–0.07)
Basophils Absolute: 0 10*3/uL (ref 0.0–0.1)
Basophils Relative: 1 %
Eosinophils Absolute: 0.2 10*3/uL (ref 0.0–0.5)
Eosinophils Relative: 2 %
HCT: 26.5 % — ABNORMAL LOW (ref 39.0–52.0)
Hemoglobin: 8.8 g/dL — ABNORMAL LOW (ref 13.0–17.0)
Immature Granulocytes: 2 %
Lymphocytes Relative: 20 %
Lymphs Abs: 1.5 10*3/uL (ref 0.7–4.0)
MCH: 28.9 pg (ref 26.0–34.0)
MCHC: 33.2 g/dL (ref 30.0–36.0)
MCV: 86.9 fL (ref 80.0–100.0)
Monocytes Absolute: 0.7 10*3/uL (ref 0.1–1.0)
Monocytes Relative: 9 %
Neutro Abs: 5.1 10*3/uL (ref 1.7–7.7)
Neutrophils Relative %: 66 %
Platelets: 179 10*3/uL (ref 150–400)
RBC: 3.05 MIL/uL — ABNORMAL LOW (ref 4.22–5.81)
RDW: 20.6 % — ABNORMAL HIGH (ref 11.5–15.5)
WBC: 7.7 10*3/uL (ref 4.0–10.5)
nRBC: 0 % (ref 0.0–0.2)

## 2023-07-26 LAB — GLUCOSE, CAPILLARY
Glucose-Capillary: 88 mg/dL (ref 70–99)
Glucose-Capillary: 84 mg/dL (ref 70–99)
Glucose-Capillary: 198 mg/dL — ABNORMAL HIGH (ref 70–99)

## 2023-07-26 LAB — MAGNESIUM: Magnesium: 1.6 mg/dL — ABNORMAL LOW (ref 1.7–2.4)

## 2023-07-26 MED ORDER — ATORVASTATIN CALCIUM 20 MG PO TABS: 20.0000 mg | ORAL_TABLET | Freq: Every day | ORAL | Status: AC

## 2023-07-26 MED ORDER — METOPROLOL SUCCINATE ER 100 MG PO TB24: 100.0000 mg | ORAL_TABLET | Freq: Every day | ORAL | Status: AC

## 2023-07-26 NOTE — Discharge Summary (Signed)
 Physician Discharge Summary  Nathan [name] WUJ:[id number] DOB: 25-Nov-1945 DOA: 07/14/2023  PCP: Bertha Broad, MD  Admit date: 07/14/2023 Discharge date: 07/26/2023  Time spent: 44 minutes  Recommendations for Outpatient Follow-up:  Needs Chem-12 CBC in 1 week's time Recommend careful titration of metformin in the outpatient as has some mild renal insufficiency may benefit from alternate agent if creatinine goes above 1.5 Note discontinuation of various diuretics Aldactone  HCTZ and may need to add back if blood pressure not controlled-not a candidate for minoxidil going forward Minimize sedating agents careful with sleep aids-palliative care should follow in the outpatient setting  Discharge Diagnoses:  MAIN problem for hospitalization   Septic shock on admission secondary to E. coli bacteremia in the setting of acute on chronic calculus cholecystitis Acute superimposed on chronic systolic heart failure AKI superimposed on CKD 3B Anemia chronic disease Elevated LFTs Delirium now resolved  Please see below for itemized issues addressed in HOpsital- refer to other progress notes for clarity if needed  Discharge Condition: Fair  Diet recommendation: Heart healthy  Filed Weights   07/24/23 0500 07/25/23 0500 07/26/23 0542  Weight: 70.2 kg 57.7 kg 59.2 kg    History of present illness:  78 year old white male known history of CAD status post CABG+ AVR 10/2008--NSTEMI 03/2023 with new systolic dysfunction 20 to 25% global hypokinesis Hospitalization 8/12 through 03/13/2023 with acute cholecystitis status post PERC drain placement and removal 8/28--prior umbilical hernia repair 2014 CKD 3A at baseline Paroxysmal A-fib CHADVASC >6 anticoagulation initially held at that hospital stay as above DM TY 2  07/15/2023 admit by critical care after being seen at [facility] urgent care with blood pressures in the 60s systolic he was having abdominal pain CXR showed opacity of LLLpatient  remained hypotensive and was admitted by critical care and was found to have septic shock eventually Blood cultures ultimately showed Enterobacter E. coli HIDA was negative for obstruction but showed delayed filling General Surgery was consulted cardiology was consulted  because of troponin peaking into the 2000 range and he was in the heart cath candidate and palliative care was Consulted---Family understood the serious nature of her illness and she was hopeful for some  patient was transferred to hospitalist service on 1/6  creatinine subsequently peaked and nephrology was consulted on 1/8  Hospital Course:   septic shock on admission secondary to E. coli bacteremia in the setting of acute on chronic  cystitis shock resolved relatively rapidly and patient was transferred to hospitalist service-area was negative for obstruction General Surgery saw the patient continuously and ultimately signed off on 1/13 and did not feel that the patient would need any further workup Patient had been on IV Ancef  during hospital stay and ultimately on 1/11:13 days this was discontinued as this was felt to be enough coverage Patient is a poor candidate for surgery given underlying multiple comorbidities etc. but does not require any acute surgery as bacteremia has resolved If he gets ill again would seriously contemplate discussion with palliative care  Acute on chronic systolic heart failure-EF this admission showed improvement from EF of 20 to 25% 03/2019 4-30 5 to 40% with still global hypokinesis Patient had elevated troponin secondary to demand ischemia and was treated for 24 hours with IV heparin  Statin was held because of elevated liver enzymes and may need to be resumed as an outpatient GDMT was limited by hypotension which is now subsequently resolved to some degree I would avoid Aldactone  and HCTZ as above can use metoprolol  XL  100 daily Would continue aspirin   AKI with peak creatinine about 3.3 acute  metabolic acidosis likely secondary to sepsis Patient was previously on bicarb drip which was discontinued Nephrology followed patient and signed off on 07/22/2023--creatinine trended down to 28/1.3 at discharge-Will need close follow-up in the outpatient setting  Elevated LFTs probably secondary to shock liver-improved  DM TY 2 with hyperglycemia-prior to admission was on Jardiance  which has been held Can continue cautious metformin 1000 twice daily in the outpatient setting  Acute metabolic encephalopathy and delirium-patient agitated 1/8 was given Haldol  and Seroquel  which was discontinued subsequently Has improved significantly and was felt to have just acute delirium and is close to baseline state at discharge   Discharge Exam: Vitals:   07/25/23 2132 07/26/23 0542  BP: 133/66 (!) 130/90  Pulse: 60 (!) 58  Resp: 18 17  Temp: 97.9 F (36.6 C) 97.8 F (36.6 C)  SpO2: 99% 98%    Subj on day of d/c   Awake coherent no distress Looks well feels fair seems to be eating and drinking-thinks he might of twisted his ankle on moving around but otherwise is okay Oriented to place person  General Exam on discharge  EOMI NCAT no distress Chest clear no added sound no wheeze rales rhonchi S1-S2 no murmur no rub no gallop ROM intact Some excoriations to right lower extremity Neuro intact  Discharge Instructions    Allergies as of 07/26/2023       Reactions   Shellfish-derived Products Anaphylaxis   Nsaids Other (See Comments)   H/o gastric ulcers = NO NSAIDs per GI   Zolpidem Tartrate Other (See Comments)   Hallucinations        Medication List     STOP taking these medications    empagliflozin  10 MG Tabs tablet Commonly known as: JARDIANCE    hydrochlorothiazide  12.5 MG capsule Commonly known as: MICROZIDE    minoxidil 10 MG tablet Commonly known as: LONITEN   spironolactone  25 MG tablet Commonly known as: ALDACTONE        TAKE these medications     allopurinol  300 MG tablet Commonly known as: ZYLOPRIM  Take 300 mg by mouth daily.   aspirin  EC 81 MG tablet Take 81 mg by mouth daily. Swallow whole.   atorvastatin  20 MG tablet Commonly known as: LIPITOR Take 1 tablet (20 mg total) by mouth daily. What changed:  medication strength how much to take   CoQ10 100 MG Caps Take 100 mg by mouth in the morning and at bedtime.   fish oil-omega-3 fatty acids 1000 MG capsule Take 1 g by mouth 2 (two) times daily.   isosorbide  dinitrate 30 MG tablet Commonly known as: ISORDIL  Take 30 mg by mouth daily.   latanoprost  0.005 % ophthalmic solution Commonly known as: XALATAN  Place 1 drop into both eyes at bedtime.   metFORMIN 1000 MG tablet Commonly known as: GLUCOPHAGE Take 1,000 mg by mouth in the morning and at bedtime.   metoprolol  succinate 100 MG 24 hr tablet Commonly known as: TOPROL -XL Take 1 tablet (100 mg total) by mouth daily. Take with or immediately following a meal. Start taking on: July 27, 2023 What changed:  medication strength how much to take   nitroGLYCERIN  0.4 MG SL tablet Commonly known as: NITROSTAT  Place 1 tablet under the tongue as needed.   omeprazole  40 MG capsule Commonly known as: PRILOSEC Take 40 mg by mouth 2 (two) times daily.   saccharomyces boulardii 250 MG capsule Commonly known as: FLORASTOR Take 1  capsule (250 mg total) by mouth 2 (two) times daily.       Allergies  Allergen Reactions   Shellfish-Derived Products Anaphylaxis   Nsaids Other (See Comments)    H/o gastric ulcers = NO NSAIDs per GI   Zolpidem Tartrate Other (See Comments)    Hallucinations    Contact information for after-discharge care     Destination     [facility] Preferred SNF .   Service: Skilled Nursing Contact information: [address] [place]  [phone]                      The results of significant diagnostics from  this hospitalization (including imaging, microbiology, ancillary and laboratory) are listed below for reference.    Significant Diagnostic Studies: ECHOCARDIOGRAM LIMITED Result Date: 07/15/2023    ECHOCARDIOGRAM LIMITED REPORT   Patient Name:   LOCHLAIN BIGNER Date of Exam: 07/15/2023 Medical Rec #:  [id number]      Height:       67.0 in Accession #:    [id number]     Weight:       128.1 lb Date of Birth:  1946-03-22     BSA:          1.673 m Patient Age:    77 years       BP:           102/65 mmHg Patient Gender: M              HR:           103 bpm. Exam Location:  Inpatient Procedure: Limited Echo, Color Doppler, Cardiac Doppler and Intracardiac            Opacification Agent Indications:    Congestive Heart Failure I50.9  History:        Patient has prior history of Echocardiogram examinations, most                 recent 03/06/2023. Aortic Valve Disease, Signs/Symptoms:Murmur;                 Risk Factors:Hypertension and Sleep Apnea.                 Aortic Valve: 23 mm Edwards bioprosthetic valve is present in                 the aortic position.  Sonographer:    Astrid Blamer Referring Phys: [id number] Francyne Islam DEWALD IMPRESSIONS  1. Limited study.  2. Left ventricular ejection fraction, by estimation, is 35 to 40%. The left ventricle has moderately decreased function. The left ventricle demonstrates global hypokinesis, more prominent mid to distal anteroseptal wall. The left ventricular internal cavity size was mildly dilated.  3. No LV mural thrombus noted with Definity  contrast.  4. Right ventricular systolic function is normal. The right ventricular size is normal.  5. Left atrial size was severely dilated.  6. The mitral valve is degenerative. Moderate mitral annular calcification.  7. The aortic valve has been repaired/replaced. Aortic valve regurgitation is not visualized. There is a 23 mm Edwards bioprosthetic valve present in the aortic position. Aortic valve mean gradient measures 7.0 mmHg.  Comparison(s): Prior images reviewed side by side. LVEF stable in range of 35-40%. FINDINGS  Left Ventricle: Left ventricular ejection fraction, by estimation, is 35 to 40%. The left ventricle has moderately decreased function. The left ventricle demonstrates global hypokinesis. The left ventricular internal cavity size was mildly dilated.  There is no concentric left ventricular hypertrophy. Right Ventricle: The right ventricular size is normal. No increase in right ventricular wall thickness. Right ventricular systolic function is normal. Left Atrium: Left atrial size was severely dilated. Mitral Valve: The mitral valve is degenerative in appearance. Moderate mitral annular calcification. Aortic Valve: The aortic valve has been repaired/replaced. Aortic valve regurgitation is not visualized. Aortic valve mean gradient measures 7.0 mmHg. Aortic valve peak gradient measures 14.1 mmHg. Aortic valve area, by VTI measures 1.50 cm. There is a 23 mm Edwards bioprosthetic valve present in the aortic position. LEFT VENTRICLE PLAX 2D LVIDd:         5.70 cm LVIDs:         4.70 cm LV PW:         0.90 cm LV IVS:        0.80 cm LVOT diam:     1.80 cm LV SV:         45 LV SV Index:   27 LVOT Area:     2.54 cm  LEFT ATRIUM              Index LA Vol (A2C):   142.0 ml 84.86 ml/m LA Vol (A4C):   119.0 ml 71.11 ml/m LA Biplane Vol: 133.0 ml 79.48 ml/m  AORTIC VALVE AV Area (Vmax):    1.23 cm AV Area (Vmean):   1.34 cm AV Area (VTI):     1.50 cm AV Vmax:           188.00 cm/s AV Vmean:          129.000 cm/s AV VTI:            0.301 m AV Peak Grad:      14.1 mmHg AV Mean Grad:      7.0 mmHg LVOT Vmax:         90.70 cm/s LVOT Vmean:        68.100 cm/s LVOT VTI:          0.178 m LVOT/AV VTI ratio: 0.59  SHUNTS Systemic VTI:  0.18 m Systemic Diam: 1.80 cm Teddie Favre MD Electronically signed by Teddie Favre MD Signature Date/Time: 07/15/2023/5:30:13 PM    Final    NM Hepatobiliary Liver Func Result Date: 07/15/2023 CLINICAL  DATA:  Cholecystitis. EXAM: NUCLEAR MEDICINE HEPATOBILIARY IMAGING TECHNIQUE: Sequential images of the abdomen were obtained out to 60 minutes following intravenous administration of radiopharmaceutical. RADIOPHARMACEUTICALS:  7.6 mCi Tc-102m  Choletec  IV COMPARISON:  03/02/2023 FINDINGS: Prompt uptake and biliary excretion of activity by the liver is seen. Biliary activity passes into small bowel, consistent with patent common bile duct. After the first hour of imaging there was no gallbladder activity visualized. Patient subsequently received 2 mg of morphine  intravenously. During the second hour of imaging gallbladder activity was visualized. IMPRESSION: 1. Delayed gallbladder filling is visualized during the second hour of imaging following the administration of morphine . Imaging findings are compatible with chronic calculus cholecystitis. 2. Patent common bile duct with biliary activity passing into the small bowel during the first hour of imaging. Electronically Signed   By: Kimberley Penman M.D.   On: 07/15/2023 15:01   CT ABDOMEN PELVIS W CONTRAST Result Date: 07/14/2023 CLINICAL DATA:  Weakness and possible sepsis EXAM: CT ABDOMEN AND PELVIS WITH CONTRAST TECHNIQUE: Multidetector CT imaging of the abdomen and pelvis was performed using the standard protocol following bolus administration of intravenous contrast. RADIATION DOSE REDUCTION: This exam was performed according to the departmental dose-optimization program which  includes automated exposure control, adjustment of the mA and/or kV according to patient size and/or use of iterative reconstruction technique. CONTRAST:  75mL OMNIPAQUE  IOHEXOL  300 MG/ML  SOLN COMPARISON:  03/01/2023 FINDINGS: Lower chest: Mild interstitial edema is noted. No sizable effusion is seen. Hepatobiliary: Liver is within normal limits. Gallbladder is well distended with dependent calculus. Significant wall thickening and edema is noted consistent with acute cholecystitis till  proven otherwise. Ultrasound may be helpful for further evaluation. The common bile duct is prominent measuring up to 11 mm greater than that expected for the patient's given age. No definitive distal CBD calculus is seen. Pancreas: Unremarkable. No pancreatic ductal dilatation or surrounding inflammatory changes. Spleen: Calcified granulomas are noted throughout the spleen. Adrenals/Urinary Tract: Adrenal glands are within normal limits. Bilateral renal cysts are noted which appear simple in nature and stable from previous exam. No follow-up is recommended. No renal calculi or obstructive changes are seen. The bladder is decompressed. Stomach/Bowel: Scattered diverticular change of the colon is noted. No evidence of diverticulitis is seen. The appendix appears within normal limits. Small bowel and stomach are unremarkable. Vascular/Lymphatic: Aortic atherosclerosis. No enlarged abdominal or pelvic lymph nodes. Reproductive: Prostate is unremarkable. Other: Mild free fluid is noted within the abdomen and pelvis. Musculoskeletal: Degenerative changes of lumbar spine are noted. IMPRESSION: Inflammatory changes involving the gallbladder consistent with acute cholecystitis. Ultrasound may be helpful for further evaluation as necessary. Common bile duct dilatation is seen although no discrete distal calculus is noted. Diverticulosis without diverticulitis. Mild free fluid in the abdomen. Electronically Signed   By: Violeta Grey M.D.   On: 07/14/2023 21:57   DG Chest Portable 1 View Result Date: 07/14/2023 CLINICAL DATA:  Generalized weakness. EXAM: PORTABLE CHEST 1 VIEW COMPARISON:  X-ray 02/28/2023.  CT 03/01/2023.  Older exams as well. FINDINGS: Underinflation. Enlarged cardiac silhouette with calcified aorta. Sternal wires. Prosthetic valves. Slight interstitial changes identified with some subtle opacity left lung base. No pneumothorax or effusion. Old left-sided rib fractures. IMPRESSION: Underinflation. Postop  chest. Enlarged cardiopericardial silhouette. Slight interstitial changes. Mild opacity left lung base. Recommend follow-up. Electronically Signed   By: Adrianna Horde M.D.   On: 07/14/2023 19:39    Microbiology: Recent Results (from the past 240 hours)  Culture, blood (Routine X 2) w Reflex to ID Panel     Status: None   Collection Time: 07/17/23  2:59 AM   Specimen: BLOOD RIGHT HAND  Result Value Ref Range Status   Specimen Description   Final    BLOOD RIGHT HAND Performed at [facility], [address], [place], [place] [address]    Special Requests   Final    BOTTLES DRAWN AEROBIC ONLY Blood Culture results may not be optimal due to an inadequate volume of blood received in culture bottles Performed at [facility], [address], [place], [place] [address]    Culture   Final    NO GROWTH 5 DAYS Performed at [facility], [address] [place], [place] [address]    Report Status 07/22/2023 FINAL  Final  Culture, blood (Routine X 2) w Reflex to ID Panel     Status: None   Collection Time: 07/17/23  2:59 AM   Specimen: BLOOD RIGHT HAND  Result Value Ref Range Status   Specimen Description   Final    BLOOD RIGHT HAND Performed at [facility], [address] [place], [place] [address]    Special Requests  Final    BOTTLES DRAWN AEROBIC ONLY Blood Culture results may not be optimal due to an inadequate volume of blood received in culture bottles Performed at [facility], [address], [place], [place] [address]    Culture   Final    NO GROWTH 5 DAYS Performed at [facility], [address] [place], [place] [address]    Report Status 07/22/2023 FINAL  Final     Labs: Basic Metabolic Panel: Recent Labs  Lab 07/22/23 0323 07/23/23 0516 07/24/23 0430 07/25/23 0455 07/26/23 0501  NA 136 137 138 135 136  K 3.5 3.5 3.8 3.6 3.6  CL 97* 100 100 101 103  CO2 31 28 28 25 27   GLUCOSE 164* 131* 118* 101*  88  BUN 47* 36* 34* 29* 28*  CREATININE 1.52* 1.33* 1.70* 1.47* 1.36*  CALCIUM  8.2* 8.5* 8.8* 8.7* 8.7*  MG 1.6* 1.8 1.7 1.6* 1.6*   Liver Function Tests: Recent Labs  Lab 07/22/23 0323 07/23/23 0516 07/24/23 0430 07/25/23 0455 07/26/23 0501  AST 159* 148* 115* 125* 94*  ALT 26 19 15 16 18   ALKPHOS 93 126 161* 263* 284*  BILITOT 0.8 0.9 0.9 0.8 0.8  PROT 4.8* 5.2* 5.2* 5.4* 5.2*  ALBUMIN 2.3* 2.4* 2.5* 2.5* 2.4*   No results for input(s): "LIPASE", "AMYLASE" in the last 168 hours. No results for input(s): "AMMONIA" in the last 168 hours. CBC: Recent Labs  Lab 07/22/23 0323 07/23/23 0516 07/24/23 0430 07/25/23 0455 07/26/23 0501  WBC 8.6 10.0 10.0 10.0 7.7  NEUTROABS 6.4 7.1 7.3 7.3 5.1  HGB 7.3* 8.3* 8.5* 9.0* 8.8*  HCT 20.8* 24.2* 24.2* 27.2* 26.5*  MCV 82.9 84.9 84.9 87.2 86.9  PLT 175 178 191 188 179   Cardiac Enzymes: No results for input(s): "CKTOTAL", "CKMB", "CKMBINDEX", "TROPONINI" in the last 168 hours. BNP: BNP (last 3 results) Recent Labs    07/14/23 1847  BNP 2,349.7*    ProBNP (last 3 results) Recent Labs    04/20/23 1126  PROBNP 7,080*    CBG: Recent Labs  Lab 07/25/23 1700 07/25/23 2016 07/25/23 2320 07/26/23 0544 07/26/23 0711  GLUCAP 182* 212* 117* 88 84    Signed:  Verlie Glisson MD   [facility] 07/26/2023, 10:07 AM

## 2023-07-26 NOTE — Care Management Important Message (Signed)
 Important Message  Patient Details IM Letter given Name: Nathan [name] MRN: [id number] Date of Birth: 1945-10-19   Important Message Given:  Yes - Medicare IM     Curtiss Dowdy 07/26/2023, 12:42 PM

## 2023-07-26 NOTE — TOC Transition Note (Signed)
 Transition of Care [facility]) - Discharge Note   Patient Details  Name: Nathan [name] MRN: [id number] Date of Birth: 02/04/1946  Transition of [facility]) CM/SW Contact:  Nathan Leys, RN Phone Number: 07/26/2023, 1:27 PM   Clinical Narrative: Text from Nathan [name], rep-Nathan [name], confirmed bed available for transfer today, RM 3213, Call report [phone], spouse -Nathan [name], notified pt will be transferred today. [facility] for transport. No further TOC needs identified.       Final next level of care: Skilled Nursing Facility Barriers to Discharge: Barriers Resolved   Patient Goals and CMS Choice Patient states their goals for this hospitalization and ongoing recovery are:: patient's wife  says he will return home CMS Medicare.gov Compare Post Acute Care list provided to:: Patient Represenative (must comment) Nathan [name] (wife))        Discharge Placement              Patient chooses bed at: [facility] Patient to be transferred to facility by: [facility] Name of family member notified: Nathan [name] ( spouse) Patient and family notified of of transfer: 07/26/23  Discharge Plan and Services Additional resources added to the After Visit Summary for     Discharge Planning Services: CM Consult                                 Social Drivers of Health (SDOH) Interventions SDOH Screenings   Food Insecurity: No Food Insecurity (07/21/2023)  Housing: Low Risk  (07/21/2023)  Transportation Needs: No Transportation Needs (07/21/2023)  Utilities: Not At Risk (07/21/2023)  Depression (PHQ2-9): Low Risk  (04/20/2023)  Social Connections: Moderately Isolated (07/15/2023)  Tobacco Use: Low Risk  (07/14/2023)     Readmission Risk Interventions    07/21/2023    2:33 PM 02/22/2023    2:58 PM  Readmission Risk Prevention Plan  Transportation Screening Complete Complete  HRI or Home Care Consult  Complete  Social Work Consult for Recovery Care Planning/Counseling   Complete  Palliative Care Screening  Not Applicable  Medication Review Oceanographer) Complete Referral to Pharmacy  HRI or Home Care Consult Complete   SW Recovery Care/Counseling Consult Complete   Palliative Care Screening Complete   Skilled Nursing Facility Not Applicable Complete

## 2023-07-27 DIAGNOSIS — I5022 Chronic systolic (congestive) heart failure: Secondary | ICD-10-CM | POA: Diagnosis not present

## 2023-07-27 DIAGNOSIS — K810 Acute cholecystitis: Secondary | ICD-10-CM | POA: Diagnosis not present

## 2023-07-27 DIAGNOSIS — R41841 Cognitive communication deficit: Secondary | ICD-10-CM | POA: Diagnosis not present

## 2023-07-27 DIAGNOSIS — F419 Anxiety disorder, unspecified: Secondary | ICD-10-CM | POA: Diagnosis not present

## 2023-07-27 DIAGNOSIS — E785 Hyperlipidemia, unspecified: Secondary | ICD-10-CM | POA: Diagnosis not present

## 2023-07-27 DIAGNOSIS — R262 Difficulty in walking, not elsewhere classified: Secondary | ICD-10-CM | POA: Diagnosis not present

## 2023-07-27 DIAGNOSIS — G9341 Metabolic encephalopathy: Secondary | ICD-10-CM | POA: Diagnosis not present

## 2023-07-27 DIAGNOSIS — G4733 Obstructive sleep apnea (adult) (pediatric): Secondary | ICD-10-CM | POA: Diagnosis not present

## 2023-07-27 DIAGNOSIS — K219 Gastro-esophageal reflux disease without esophagitis: Secondary | ICD-10-CM | POA: Diagnosis not present

## 2023-07-27 DIAGNOSIS — M6281 Muscle weakness (generalized): Secondary | ICD-10-CM | POA: Diagnosis not present

## 2023-07-27 DIAGNOSIS — I10 Essential (primary) hypertension: Secondary | ICD-10-CM | POA: Diagnosis not present

## 2023-07-27 DIAGNOSIS — E119 Type 2 diabetes mellitus without complications: Secondary | ICD-10-CM | POA: Diagnosis not present

## 2023-07-28 DIAGNOSIS — E119 Type 2 diabetes mellitus without complications: Secondary | ICD-10-CM | POA: Diagnosis not present

## 2023-07-28 DIAGNOSIS — F419 Anxiety disorder, unspecified: Secondary | ICD-10-CM | POA: Diagnosis not present

## 2023-07-28 DIAGNOSIS — I10 Essential (primary) hypertension: Secondary | ICD-10-CM | POA: Diagnosis not present

## 2023-07-28 DIAGNOSIS — I5022 Chronic systolic (congestive) heart failure: Secondary | ICD-10-CM | POA: Diagnosis not present

## 2023-07-28 DIAGNOSIS — G4733 Obstructive sleep apnea (adult) (pediatric): Secondary | ICD-10-CM | POA: Diagnosis not present

## 2023-07-28 DIAGNOSIS — K219 Gastro-esophageal reflux disease without esophagitis: Secondary | ICD-10-CM | POA: Diagnosis not present

## 2023-07-28 DIAGNOSIS — G9341 Metabolic encephalopathy: Secondary | ICD-10-CM | POA: Diagnosis not present

## 2023-07-28 DIAGNOSIS — K810 Acute cholecystitis: Secondary | ICD-10-CM | POA: Diagnosis not present

## 2023-07-28 DIAGNOSIS — R262 Difficulty in walking, not elsewhere classified: Secondary | ICD-10-CM | POA: Diagnosis not present

## 2023-07-28 DIAGNOSIS — E785 Hyperlipidemia, unspecified: Secondary | ICD-10-CM | POA: Diagnosis not present

## 2023-07-28 DIAGNOSIS — M6281 Muscle weakness (generalized): Secondary | ICD-10-CM | POA: Diagnosis not present

## 2023-07-28 DIAGNOSIS — R41841 Cognitive communication deficit: Secondary | ICD-10-CM | POA: Diagnosis not present

## 2023-07-31 DIAGNOSIS — E785 Hyperlipidemia, unspecified: Secondary | ICD-10-CM | POA: Diagnosis not present

## 2023-07-31 DIAGNOSIS — F419 Anxiety disorder, unspecified: Secondary | ICD-10-CM | POA: Diagnosis not present

## 2023-07-31 DIAGNOSIS — G9341 Metabolic encephalopathy: Secondary | ICD-10-CM | POA: Diagnosis not present

## 2023-07-31 DIAGNOSIS — K810 Acute cholecystitis: Secondary | ICD-10-CM | POA: Diagnosis not present

## 2023-07-31 DIAGNOSIS — M6281 Muscle weakness (generalized): Secondary | ICD-10-CM | POA: Diagnosis not present

## 2023-07-31 DIAGNOSIS — I5022 Chronic systolic (congestive) heart failure: Secondary | ICD-10-CM | POA: Diagnosis not present

## 2023-07-31 DIAGNOSIS — I10 Essential (primary) hypertension: Secondary | ICD-10-CM | POA: Diagnosis not present

## 2023-07-31 DIAGNOSIS — E119 Type 2 diabetes mellitus without complications: Secondary | ICD-10-CM | POA: Diagnosis not present

## 2023-07-31 DIAGNOSIS — R262 Difficulty in walking, not elsewhere classified: Secondary | ICD-10-CM | POA: Diagnosis not present

## 2023-07-31 DIAGNOSIS — K219 Gastro-esophageal reflux disease without esophagitis: Secondary | ICD-10-CM | POA: Diagnosis not present

## 2023-07-31 DIAGNOSIS — R41841 Cognitive communication deficit: Secondary | ICD-10-CM | POA: Diagnosis not present

## 2023-07-31 DIAGNOSIS — G4733 Obstructive sleep apnea (adult) (pediatric): Secondary | ICD-10-CM | POA: Diagnosis not present

## 2023-08-01 DIAGNOSIS — I5022 Chronic systolic (congestive) heart failure: Secondary | ICD-10-CM | POA: Diagnosis not present

## 2023-08-01 DIAGNOSIS — F419 Anxiety disorder, unspecified: Secondary | ICD-10-CM | POA: Diagnosis not present

## 2023-08-01 DIAGNOSIS — G9341 Metabolic encephalopathy: Secondary | ICD-10-CM | POA: Diagnosis not present

## 2023-08-01 DIAGNOSIS — K810 Acute cholecystitis: Secondary | ICD-10-CM | POA: Diagnosis not present

## 2023-08-01 DIAGNOSIS — R262 Difficulty in walking, not elsewhere classified: Secondary | ICD-10-CM | POA: Diagnosis not present

## 2023-08-01 DIAGNOSIS — R41841 Cognitive communication deficit: Secondary | ICD-10-CM | POA: Diagnosis not present

## 2023-08-01 DIAGNOSIS — I10 Essential (primary) hypertension: Secondary | ICD-10-CM | POA: Diagnosis not present

## 2023-08-01 DIAGNOSIS — M6281 Muscle weakness (generalized): Secondary | ICD-10-CM | POA: Diagnosis not present

## 2023-08-01 DIAGNOSIS — E119 Type 2 diabetes mellitus without complications: Secondary | ICD-10-CM | POA: Diagnosis not present

## 2023-08-01 DIAGNOSIS — K219 Gastro-esophageal reflux disease without esophagitis: Secondary | ICD-10-CM | POA: Diagnosis not present

## 2023-08-01 DIAGNOSIS — E785 Hyperlipidemia, unspecified: Secondary | ICD-10-CM | POA: Diagnosis not present

## 2023-08-01 DIAGNOSIS — G4733 Obstructive sleep apnea (adult) (pediatric): Secondary | ICD-10-CM | POA: Diagnosis not present

## 2023-08-02 DIAGNOSIS — M6281 Muscle weakness (generalized): Secondary | ICD-10-CM | POA: Diagnosis not present

## 2023-08-02 DIAGNOSIS — G9341 Metabolic encephalopathy: Secondary | ICD-10-CM | POA: Diagnosis not present

## 2023-08-02 DIAGNOSIS — F419 Anxiety disorder, unspecified: Secondary | ICD-10-CM | POA: Diagnosis not present

## 2023-08-02 DIAGNOSIS — E1349 Other specified diabetes mellitus with other diabetic neurological complication: Secondary | ICD-10-CM | POA: Diagnosis not present

## 2023-08-02 DIAGNOSIS — E1169 Type 2 diabetes mellitus with other specified complication: Secondary | ICD-10-CM | POA: Diagnosis not present

## 2023-08-02 DIAGNOSIS — R262 Difficulty in walking, not elsewhere classified: Secondary | ICD-10-CM | POA: Diagnosis not present

## 2023-08-02 DIAGNOSIS — E119 Type 2 diabetes mellitus without complications: Secondary | ICD-10-CM | POA: Diagnosis not present

## 2023-08-02 DIAGNOSIS — C434 Malignant melanoma of scalp and neck: Secondary | ICD-10-CM | POA: Diagnosis not present

## 2023-08-02 DIAGNOSIS — G4733 Obstructive sleep apnea (adult) (pediatric): Secondary | ICD-10-CM | POA: Diagnosis not present

## 2023-08-02 DIAGNOSIS — E113393 Type 2 diabetes mellitus with moderate nonproliferative diabetic retinopathy without macular edema, bilateral: Secondary | ICD-10-CM | POA: Diagnosis not present

## 2023-08-02 DIAGNOSIS — K219 Gastro-esophageal reflux disease without esophagitis: Secondary | ICD-10-CM | POA: Diagnosis not present

## 2023-08-02 DIAGNOSIS — K810 Acute cholecystitis: Secondary | ICD-10-CM | POA: Diagnosis not present

## 2023-08-02 DIAGNOSIS — R41841 Cognitive communication deficit: Secondary | ICD-10-CM | POA: Diagnosis not present

## 2023-08-02 DIAGNOSIS — L309 Dermatitis, unspecified: Secondary | ICD-10-CM | POA: Diagnosis not present

## 2023-08-02 DIAGNOSIS — I10 Essential (primary) hypertension: Secondary | ICD-10-CM | POA: Diagnosis not present

## 2023-08-02 DIAGNOSIS — E785 Hyperlipidemia, unspecified: Secondary | ICD-10-CM | POA: Diagnosis not present

## 2023-08-02 DIAGNOSIS — I5022 Chronic systolic (congestive) heart failure: Secondary | ICD-10-CM | POA: Diagnosis not present

## 2023-08-04 DIAGNOSIS — I10 Essential (primary) hypertension: Secondary | ICD-10-CM | POA: Diagnosis not present

## 2023-08-04 DIAGNOSIS — I5022 Chronic systolic (congestive) heart failure: Secondary | ICD-10-CM | POA: Diagnosis not present

## 2023-08-04 DIAGNOSIS — E785 Hyperlipidemia, unspecified: Secondary | ICD-10-CM | POA: Diagnosis not present

## 2023-08-04 DIAGNOSIS — K810 Acute cholecystitis: Secondary | ICD-10-CM | POA: Diagnosis not present

## 2023-08-04 DIAGNOSIS — M6281 Muscle weakness (generalized): Secondary | ICD-10-CM | POA: Diagnosis not present

## 2023-08-04 DIAGNOSIS — G9341 Metabolic encephalopathy: Secondary | ICD-10-CM | POA: Diagnosis not present

## 2023-08-04 DIAGNOSIS — R262 Difficulty in walking, not elsewhere classified: Secondary | ICD-10-CM | POA: Diagnosis not present

## 2023-08-04 DIAGNOSIS — F419 Anxiety disorder, unspecified: Secondary | ICD-10-CM | POA: Diagnosis not present

## 2023-08-04 DIAGNOSIS — E119 Type 2 diabetes mellitus without complications: Secondary | ICD-10-CM | POA: Diagnosis not present

## 2023-08-04 DIAGNOSIS — R41841 Cognitive communication deficit: Secondary | ICD-10-CM | POA: Diagnosis not present

## 2023-08-04 DIAGNOSIS — K219 Gastro-esophageal reflux disease without esophagitis: Secondary | ICD-10-CM | POA: Diagnosis not present

## 2023-08-04 DIAGNOSIS — G4733 Obstructive sleep apnea (adult) (pediatric): Secondary | ICD-10-CM | POA: Diagnosis not present

## 2023-08-07 DIAGNOSIS — E1169 Type 2 diabetes mellitus with other specified complication: Secondary | ICD-10-CM | POA: Diagnosis not present

## 2023-08-07 DIAGNOSIS — G9341 Metabolic encephalopathy: Secondary | ICD-10-CM | POA: Diagnosis not present

## 2023-08-07 DIAGNOSIS — N179 Acute kidney failure, unspecified: Secondary | ICD-10-CM | POA: Diagnosis not present

## 2023-08-07 DIAGNOSIS — I119 Hypertensive heart disease without heart failure: Secondary | ICD-10-CM | POA: Diagnosis not present

## 2023-08-07 DIAGNOSIS — E43 Unspecified severe protein-calorie malnutrition: Secondary | ICD-10-CM | POA: Diagnosis not present

## 2023-08-07 DIAGNOSIS — E113393 Type 2 diabetes mellitus with moderate nonproliferative diabetic retinopathy without macular edema, bilateral: Secondary | ICD-10-CM | POA: Diagnosis not present

## 2023-08-07 DIAGNOSIS — I2510 Atherosclerotic heart disease of native coronary artery without angina pectoris: Secondary | ICD-10-CM | POA: Diagnosis not present

## 2023-08-07 DIAGNOSIS — E1349 Other specified diabetes mellitus with other diabetic neurological complication: Secondary | ICD-10-CM | POA: Diagnosis not present

## 2023-08-07 DIAGNOSIS — I10 Essential (primary) hypertension: Secondary | ICD-10-CM | POA: Diagnosis not present

## 2023-08-07 DIAGNOSIS — J9601 Acute respiratory failure with hypoxia: Secondary | ICD-10-CM | POA: Diagnosis not present

## 2023-08-07 DIAGNOSIS — E785 Hyperlipidemia, unspecified: Secondary | ICD-10-CM | POA: Diagnosis not present

## 2023-08-07 DIAGNOSIS — F411 Generalized anxiety disorder: Secondary | ICD-10-CM | POA: Diagnosis not present

## 2023-08-08 DIAGNOSIS — F411 Generalized anxiety disorder: Secondary | ICD-10-CM | POA: Diagnosis not present

## 2023-08-08 DIAGNOSIS — E1169 Type 2 diabetes mellitus with other specified complication: Secondary | ICD-10-CM | POA: Diagnosis not present

## 2023-08-08 DIAGNOSIS — N179 Acute kidney failure, unspecified: Secondary | ICD-10-CM | POA: Diagnosis not present

## 2023-08-08 DIAGNOSIS — E1349 Other specified diabetes mellitus with other diabetic neurological complication: Secondary | ICD-10-CM | POA: Diagnosis not present

## 2023-08-08 DIAGNOSIS — I2510 Atherosclerotic heart disease of native coronary artery without angina pectoris: Secondary | ICD-10-CM | POA: Diagnosis not present

## 2023-08-08 DIAGNOSIS — E113393 Type 2 diabetes mellitus with moderate nonproliferative diabetic retinopathy without macular edema, bilateral: Secondary | ICD-10-CM | POA: Diagnosis not present

## 2023-08-08 DIAGNOSIS — I119 Hypertensive heart disease without heart failure: Secondary | ICD-10-CM | POA: Diagnosis not present

## 2023-08-08 DIAGNOSIS — G9341 Metabolic encephalopathy: Secondary | ICD-10-CM | POA: Diagnosis not present

## 2023-08-08 DIAGNOSIS — E785 Hyperlipidemia, unspecified: Secondary | ICD-10-CM | POA: Diagnosis not present

## 2023-08-08 DIAGNOSIS — E43 Unspecified severe protein-calorie malnutrition: Secondary | ICD-10-CM | POA: Diagnosis not present

## 2023-08-08 DIAGNOSIS — I10 Essential (primary) hypertension: Secondary | ICD-10-CM | POA: Diagnosis not present

## 2023-08-08 DIAGNOSIS — J9601 Acute respiratory failure with hypoxia: Secondary | ICD-10-CM | POA: Diagnosis not present

## 2023-08-09 DIAGNOSIS — E1349 Other specified diabetes mellitus with other diabetic neurological complication: Secondary | ICD-10-CM | POA: Diagnosis not present

## 2023-08-09 DIAGNOSIS — J9601 Acute respiratory failure with hypoxia: Secondary | ICD-10-CM | POA: Diagnosis not present

## 2023-08-09 DIAGNOSIS — F411 Generalized anxiety disorder: Secondary | ICD-10-CM | POA: Diagnosis not present

## 2023-08-09 DIAGNOSIS — E113393 Type 2 diabetes mellitus with moderate nonproliferative diabetic retinopathy without macular edema, bilateral: Secondary | ICD-10-CM | POA: Diagnosis not present

## 2023-08-09 DIAGNOSIS — I2510 Atherosclerotic heart disease of native coronary artery without angina pectoris: Secondary | ICD-10-CM | POA: Diagnosis not present

## 2023-08-09 DIAGNOSIS — E785 Hyperlipidemia, unspecified: Secondary | ICD-10-CM | POA: Diagnosis not present

## 2023-08-09 DIAGNOSIS — N179 Acute kidney failure, unspecified: Secondary | ICD-10-CM | POA: Diagnosis not present

## 2023-08-09 DIAGNOSIS — E43 Unspecified severe protein-calorie malnutrition: Secondary | ICD-10-CM | POA: Diagnosis not present

## 2023-08-09 DIAGNOSIS — I10 Essential (primary) hypertension: Secondary | ICD-10-CM | POA: Diagnosis not present

## 2023-08-09 DIAGNOSIS — I119 Hypertensive heart disease without heart failure: Secondary | ICD-10-CM | POA: Diagnosis not present

## 2023-08-09 DIAGNOSIS — G9341 Metabolic encephalopathy: Secondary | ICD-10-CM | POA: Diagnosis not present

## 2023-08-09 DIAGNOSIS — E1169 Type 2 diabetes mellitus with other specified complication: Secondary | ICD-10-CM | POA: Diagnosis not present

## 2023-08-10 DIAGNOSIS — J9601 Acute respiratory failure with hypoxia: Secondary | ICD-10-CM | POA: Diagnosis not present

## 2023-08-10 DIAGNOSIS — N179 Acute kidney failure, unspecified: Secondary | ICD-10-CM | POA: Diagnosis not present

## 2023-08-10 DIAGNOSIS — I2510 Atherosclerotic heart disease of native coronary artery without angina pectoris: Secondary | ICD-10-CM | POA: Diagnosis not present

## 2023-08-10 DIAGNOSIS — E1349 Other specified diabetes mellitus with other diabetic neurological complication: Secondary | ICD-10-CM | POA: Diagnosis not present

## 2023-08-10 DIAGNOSIS — G9341 Metabolic encephalopathy: Secondary | ICD-10-CM | POA: Diagnosis not present

## 2023-08-10 DIAGNOSIS — I119 Hypertensive heart disease without heart failure: Secondary | ICD-10-CM | POA: Diagnosis not present

## 2023-08-10 DIAGNOSIS — I10 Essential (primary) hypertension: Secondary | ICD-10-CM | POA: Diagnosis not present

## 2023-08-10 DIAGNOSIS — L309 Dermatitis, unspecified: Secondary | ICD-10-CM | POA: Diagnosis not present

## 2023-08-10 DIAGNOSIS — E785 Hyperlipidemia, unspecified: Secondary | ICD-10-CM | POA: Diagnosis not present

## 2023-08-10 DIAGNOSIS — E113393 Type 2 diabetes mellitus with moderate nonproliferative diabetic retinopathy without macular edema, bilateral: Secondary | ICD-10-CM | POA: Diagnosis not present

## 2023-08-10 DIAGNOSIS — E43 Unspecified severe protein-calorie malnutrition: Secondary | ICD-10-CM | POA: Diagnosis not present

## 2023-08-10 DIAGNOSIS — F411 Generalized anxiety disorder: Secondary | ICD-10-CM | POA: Diagnosis not present

## 2023-08-10 DIAGNOSIS — E1169 Type 2 diabetes mellitus with other specified complication: Secondary | ICD-10-CM | POA: Diagnosis not present

## 2023-08-11 DIAGNOSIS — I2510 Atherosclerotic heart disease of native coronary artery without angina pectoris: Secondary | ICD-10-CM | POA: Diagnosis not present

## 2023-08-11 DIAGNOSIS — E785 Hyperlipidemia, unspecified: Secondary | ICD-10-CM | POA: Diagnosis not present

## 2023-08-11 DIAGNOSIS — E43 Unspecified severe protein-calorie malnutrition: Secondary | ICD-10-CM | POA: Diagnosis not present

## 2023-08-11 DIAGNOSIS — E113393 Type 2 diabetes mellitus with moderate nonproliferative diabetic retinopathy without macular edema, bilateral: Secondary | ICD-10-CM | POA: Diagnosis not present

## 2023-08-11 DIAGNOSIS — G9341 Metabolic encephalopathy: Secondary | ICD-10-CM | POA: Diagnosis not present

## 2023-08-11 DIAGNOSIS — I119 Hypertensive heart disease without heart failure: Secondary | ICD-10-CM | POA: Diagnosis not present

## 2023-08-11 DIAGNOSIS — E1349 Other specified diabetes mellitus with other diabetic neurological complication: Secondary | ICD-10-CM | POA: Diagnosis not present

## 2023-08-11 DIAGNOSIS — N179 Acute kidney failure, unspecified: Secondary | ICD-10-CM | POA: Diagnosis not present

## 2023-08-11 DIAGNOSIS — E1169 Type 2 diabetes mellitus with other specified complication: Secondary | ICD-10-CM | POA: Diagnosis not present

## 2023-08-11 DIAGNOSIS — J9601 Acute respiratory failure with hypoxia: Secondary | ICD-10-CM | POA: Diagnosis not present

## 2023-08-11 DIAGNOSIS — I10 Essential (primary) hypertension: Secondary | ICD-10-CM | POA: Diagnosis not present

## 2023-08-11 DIAGNOSIS — F411 Generalized anxiety disorder: Secondary | ICD-10-CM | POA: Diagnosis not present

## 2023-08-17 DIAGNOSIS — C439 Malignant melanoma of skin, unspecified: Secondary | ICD-10-CM | POA: Diagnosis not present

## 2023-08-17 DIAGNOSIS — C434 Malignant melanoma of scalp and neck: Secondary | ICD-10-CM | POA: Diagnosis not present

## 2023-08-17 NOTE — Progress Notes (Signed)
 Date of Surgery: 08/17/2023  Pre-op Diagnosis:  Melanoma of the scalp  Post-op Diagnosis: Same  Performing Service: Surgical Oncology  Attending Surgeon:  Alm LELON Como, MD   Procedure:  1.  Wide local excision of a T2 melanoma of the scalp (defect created 3.1 x 3.1 cm)   Anesthesia: Local  Estimated Blood Loss: Trace  Indications for procedure:   T1a melanoma of the scalp.  The patient had a very difficult fall with cardiac issues.  He is still not been healthy enough to get a cardiac catheterization.  I have recommended to him that we manage his T2 melanoma of the scalp with an appropriate 1 cm circumferential margin excision.  We will then allow this area to heal via secondary intent.  The patient and his wife are well-informed the risks benefits and alternatives and request to proceed.  Findings:    The primary lesion was excised with 1.0 cm margins.  It was not closed.    Procedure in Detail: The patient was taken to the procedure room and placed in the sitting position in his wheelchair.  I marked the location verifying the position with photographs and measurements.  The patient was prepped in sterile fashion. An appropriate timeout was performed.  We drew out 1.0 cm margins around the primary. Local anesthetic was used to anesthetize the surgical site. The lesion was  excised on the marked out margins, taken down through the subcutaneous fat to the galea of the skull.  It was oriented for the pathologists and sent for permanent section.  Defect measured 3.1 x 3.1 cm hemostasis was assured.  Vaseline was applied.  Patient tolerated the procedure well.  At the completion of the procedure, all lap, sponge and instrument counts were correct. Blood loss was minimal.   Teaching Surgeon Attestation: I was present and scrubbed for the entire case.    Wide Local Excision for Primary Cutaneous Melanoma - Excision 1 (Scalp) Operation performed with curative intent Yes  Original Breslow  thickness of the lesion 1.5 mm (to the tenth of a millimeter)  Clinical margin width 1 cm  Depth of excision Other to the galea of the skull

## 2023-08-28 ENCOUNTER — Ambulatory Visit (INDEPENDENT_AMBULATORY_CARE_PROVIDER_SITE_OTHER): Payer: Medicare Other | Admitting: Podiatry

## 2023-08-28 DIAGNOSIS — M79675 Pain in left toe(s): Secondary | ICD-10-CM | POA: Diagnosis not present

## 2023-08-28 DIAGNOSIS — B351 Tinea unguium: Secondary | ICD-10-CM

## 2023-08-28 DIAGNOSIS — M79674 Pain in right toe(s): Secondary | ICD-10-CM | POA: Diagnosis not present

## 2023-08-28 NOTE — Progress Notes (Unsigned)
Subjective:  Patient ID: Nathan [name], male    DOB: 16-May-1946,  MRN: [id number]  Nathan [name] presents to clinic today for:  Chief Complaint  Patient presents with   rfc    Nail trim   Patient notes nails are thick, discolored, elongated and painful in shoegear when trying to ambulate.  He has a bandage on right shin, noting an abrasion to the area that has been healing and he is seeing another specialist for this.    PCP is Garlan Fillers, MD.  Past Medical History:  Diagnosis Date   Allergy    Aortic stenosis    Arthritis    Balance problem 04/26/2022   Blood transfusion without reported diagnosis    CAD (coronary artery disease)    Cataract    both eyes surgically removed   Diabetes mellitus, type 2 (HCC)    Diverticulosis of colon (without mention of hemorrhage)    Duodenal ulcer perforation (HCC)    blood transfusion   Flesh-eating bacteria (HCC) 1995   GERD (gastroesophageal reflux disease)    Glaucoma    pre glaucoma on Xalatan drops   Heart murmur    History of diabetic retinopathy    History of gastrointestinal bleeding    History of necrotizing fasciitis    History of prosthetic aortic valve    Hypercholesteremia    Myocardial infarction (HCC) 02/23/2016   Neuropathic pain 04/26/2022   Nightmares    CHRONIC   OSA (obstructive sleep apnea)    borderline no CPAP    Personal history of colonic polyps 11/23/2009   TUBULAR ADENOMA   Postoperative anemia    Sinus bradycardia    Sleep apnea    no cpap   Stomach ulcer    Hx of   Systolic hypertension    Vitamin B12 deficiency     Past Surgical History:  Procedure Laterality Date   AORTIC VALVE REPLACEMENT     BLEPHAROPLASTY  11/24/2014   CARDIAC CATHETERIZATION N/A 03/01/2016   Procedure: Coronary/Graft Angiography;  Surgeon: Peter M Swaziland, MD;  Location: [facility];  Service: Cardiovascular;  Laterality: N/A;   CATARACT EXTRACTION W/ INTRAOCULAR LENS IMPLANT     OD   COLONOSCOPY      CORONARY ARTERY BYPASS GRAFT  2010   x 4   flesh eating disease surgery      surgeries x 7    HERNIA REPAIR  1972   IR EXCHANGE BILIARY DRAIN  03/07/2023   IR EXCHANGE BILIARY DRAIN  04/18/2023   IR PERC CHOLECYSTOSTOMY  03/02/2023   POLYPECTOMY     RETINAL LASER PROCEDURE     SCROTAL SURGERY  1995   resection   UMBILICAL HERNIA REPAIR N/A 04/10/2013   open UHR w Ventralex mesh patch - Dr Daphine Deutscher    Allergies  Allergen Reactions   Shellfish-Derived Products Anaphylaxis   Nsaids Other (See Comments)    H/o gastric ulcers = NO NSAIDs per GI   Zolpidem Tartrate Other (See Comments)    Hallucinations    Review of Systems: Negative except as noted in the HPI.  Objective:  Nathan [name] is a pleasant 78 y.o. male in NAD. AAO x 3.  Vascular Examination: Capillary refill time is 3-5 seconds to toes bilateral. Palpable pedal pulses b/l LE. Digital hair present b/l.  Skin temperature gradient WNL b/l. No varicosities b/l. No cyanosis noted b/l.   Dermatological Examination: Pedal skin with normal turgor, texture and tone b/l. No open wounds. No  interdigital macerations b/l. Toenails x10 are 3mm thick, discolored, dystrophic with subungual debris. There is pain with compression of the nail plates.  They are elongated x10     Latest Ref Rng & Units 07/14/2023   11:51 PM 02/21/2023    9:25 AM  Hemoglobin A1C  Hemoglobin-A1c 4.8 - 5.6 % 6.8  6.1    Assessment/Plan: 1. Pain due to onychomycosis of toenails of both feet    The mycotic toenails were sharply debrided x10 with sterile nail nippers and a power debriding burr to decrease bulk/thickness and length.    Return in about 3 months (around 11/25/2023) for RFC.   Clerance Lav, DPM, FACFAS [facility]     [address] [place], [place] [address]                Office [phone]  Fax [phone]

## 2023-09-01 DIAGNOSIS — A419 Sepsis, unspecified organism: Secondary | ICD-10-CM | POA: Diagnosis not present

## 2023-09-01 DIAGNOSIS — T148XXA Other injury of unspecified body region, initial encounter: Secondary | ICD-10-CM | POA: Diagnosis not present

## 2023-09-01 DIAGNOSIS — N179 Acute kidney failure, unspecified: Secondary | ICD-10-CM | POA: Diagnosis not present

## 2023-09-01 DIAGNOSIS — I480 Paroxysmal atrial fibrillation: Secondary | ICD-10-CM | POA: Diagnosis not present

## 2023-09-01 DIAGNOSIS — R410 Disorientation, unspecified: Secondary | ICD-10-CM | POA: Diagnosis not present

## 2023-09-01 DIAGNOSIS — E1151 Type 2 diabetes mellitus with diabetic peripheral angiopathy without gangrene: Secondary | ICD-10-CM | POA: Diagnosis not present

## 2023-09-01 DIAGNOSIS — I5021 Acute systolic (congestive) heart failure: Secondary | ICD-10-CM | POA: Diagnosis not present

## 2023-09-01 DIAGNOSIS — I2510 Atherosclerotic heart disease of native coronary artery without angina pectoris: Secondary | ICD-10-CM | POA: Diagnosis not present

## 2023-09-01 DIAGNOSIS — E611 Iron deficiency: Secondary | ICD-10-CM | POA: Diagnosis not present

## 2023-09-01 DIAGNOSIS — E785 Hyperlipidemia, unspecified: Secondary | ICD-10-CM | POA: Diagnosis not present

## 2023-09-01 DIAGNOSIS — R7401 Elevation of levels of liver transaminase levels: Secondary | ICD-10-CM | POA: Diagnosis not present

## 2023-09-01 DIAGNOSIS — K811 Chronic cholecystitis: Secondary | ICD-10-CM | POA: Diagnosis not present

## 2023-09-01 DIAGNOSIS — R6521 Severe sepsis with septic shock: Secondary | ICD-10-CM | POA: Diagnosis not present

## 2023-09-11 DIAGNOSIS — C434 Malignant melanoma of scalp and neck: Secondary | ICD-10-CM | POA: Diagnosis not present

## 2023-09-22 DIAGNOSIS — K8020 Calculus of gallbladder without cholecystitis without obstruction: Secondary | ICD-10-CM | POA: Diagnosis not present

## 2023-10-02 DIAGNOSIS — C434 Malignant melanoma of scalp and neck: Secondary | ICD-10-CM | POA: Diagnosis not present

## 2023-10-03 DIAGNOSIS — H401134 Primary open-angle glaucoma, bilateral, indeterminate stage: Secondary | ICD-10-CM | POA: Diagnosis not present

## 2023-10-06 ENCOUNTER — Other Ambulatory Visit: Payer: Self-pay | Admitting: Nurse Practitioner

## 2023-10-12 DIAGNOSIS — D510 Vitamin B12 deficiency anemia due to intrinsic factor deficiency: Secondary | ICD-10-CM | POA: Diagnosis not present

## 2023-10-30 DIAGNOSIS — C434 Malignant melanoma of scalp and neck: Secondary | ICD-10-CM | POA: Diagnosis not present

## 2023-11-09 DIAGNOSIS — D510 Vitamin B12 deficiency anemia due to intrinsic factor deficiency: Secondary | ICD-10-CM | POA: Diagnosis not present

## 2023-11-11 ENCOUNTER — Other Ambulatory Visit: Payer: Self-pay | Admitting: Nurse Practitioner

## 2023-11-13 DIAGNOSIS — H401132 Primary open-angle glaucoma, bilateral, moderate stage: Secondary | ICD-10-CM | POA: Diagnosis not present

## 2023-11-13 DIAGNOSIS — E113493 Type 2 diabetes mellitus with severe nonproliferative diabetic retinopathy without macular edema, bilateral: Secondary | ICD-10-CM | POA: Diagnosis not present

## 2023-11-13 DIAGNOSIS — H43813 Vitreous degeneration, bilateral: Secondary | ICD-10-CM | POA: Diagnosis not present

## 2023-11-14 ENCOUNTER — Other Ambulatory Visit: Payer: Self-pay | Admitting: Nurse Practitioner

## 2023-11-27 DIAGNOSIS — C434 Malignant melanoma of scalp and neck: Secondary | ICD-10-CM | POA: Diagnosis not present

## 2023-11-28 ENCOUNTER — Ambulatory Visit: Payer: Medicare Other | Admitting: Podiatry

## 2023-12-14 DIAGNOSIS — D510 Vitamin B12 deficiency anemia due to intrinsic factor deficiency: Secondary | ICD-10-CM | POA: Diagnosis not present

## 2023-12-18 ENCOUNTER — Ambulatory Visit (INDEPENDENT_AMBULATORY_CARE_PROVIDER_SITE_OTHER): Admitting: Podiatry

## 2023-12-18 DIAGNOSIS — B351 Tinea unguium: Secondary | ICD-10-CM

## 2023-12-18 DIAGNOSIS — M79674 Pain in right toe(s): Secondary | ICD-10-CM | POA: Diagnosis not present

## 2023-12-18 DIAGNOSIS — M79675 Pain in left toe(s): Secondary | ICD-10-CM | POA: Diagnosis not present

## 2023-12-18 NOTE — Progress Notes (Unsigned)
 Subjective:  Patient ID: Nathan [name], male    DOB: [birth year],  MRN: [id number]  [name] presents to clinic today for:  Chief Complaint  Patient presents with   Nail Problem    Pt stated that he is here to get his nails trimmed   Patient notes nails are thick, discolored, elongated and painful in shoegear when trying to ambulate.    PCP is Bertha Broad, MD.  Past Medical History:  Diagnosis Date   Allergy    Aortic stenosis    Arthritis    Balance problem 04/26/2022   Blood transfusion without reported diagnosis    CAD (coronary artery disease)    Cataract    both eyes surgically removed   Diabetes mellitus, type 2 (HCC)    Diverticulosis of colon (without mention of hemorrhage)    Duodenal ulcer perforation (HCC)    blood transfusion   Flesh-eating bacteria (HCC) 1995   GERD (gastroesophageal reflux disease)    Glaucoma    pre glaucoma on Xalatan  drops   Heart murmur    History of diabetic retinopathy    History of gastrointestinal bleeding    History of necrotizing fasciitis    History of prosthetic aortic valve    Hypercholesteremia    Myocardial infarction (HCC) 02/23/2016   Neuropathic pain 04/26/2022   Nightmares    CHRONIC   OSA (obstructive sleep apnea)    borderline no CPAP    Personal history of colonic polyps 11/23/2009   TUBULAR ADENOMA   Postoperative anemia    Sinus bradycardia    Sleep apnea    no cpap   Stomach ulcer    Hx of   Systolic hypertension    Vitamin B12 deficiency    Past Surgical History:  Procedure Laterality Date   AORTIC VALVE REPLACEMENT     BLEPHAROPLASTY  11/24/2014   CARDIAC CATHETERIZATION N/A 03/01/2016   Procedure: Coronary/Graft Angiography;  Surgeon: Peter M Swaziland, MD;  Location: [facility];  Service: Cardiovascular;  Laterality: N/A;   CATARACT EXTRACTION W/ INTRAOCULAR LENS IMPLANT     OD   COLONOSCOPY     CORONARY ARTERY BYPASS GRAFT  2010   x 4   flesh eating disease surgery       surgeries x 7    HERNIA REPAIR  1972   IR EXCHANGE BILIARY DRAIN  03/07/2023   IR EXCHANGE BILIARY DRAIN  04/18/2023   IR PERC CHOLECYSTOSTOMY  03/02/2023   POLYPECTOMY     RETINAL LASER PROCEDURE     SCROTAL SURGERY  1995   resection   UMBILICAL HERNIA REPAIR N/A 04/10/2013   open UHR w Ventralex mesh patch - Dr Gaylyn Keas   Allergies  Allergen Reactions   Shellfish-Derived Products Anaphylaxis   Nsaids Other (See Comments)    H/o gastric ulcers = NO NSAIDs per GI   Zolpidem Tartrate Other (See Comments)    Hallucinations    Review of Systems: Negative except as noted in the HPI.  Objective:  Nathan [name] is a pleasant 78 y.o. male in NAD. AAO x 3.  Vascular Examination: Capillary refill time is 3-5 seconds to toes bilateral. Palpable pedal pulses b/l LE. Digital hair present b/l.  Skin temperature gradient WNL b/l. No varicosities b/l. No cyanosis noted b/l.   Dermatological Examination: Pedal skin with normal turgor, texture and tone b/l. No open wounds. No interdigital macerations b/l. Toenails x10 are 3mm thick, discolored, dystrophic with subungual debris. There is pain with compression  of the nail plates.  They are elongated x10     Latest Ref Rng & Units 07/14/2023   11:51 PM 02/21/2023    9:25 AM  Hemoglobin A1C  Hemoglobin-A1c 4.8 - 5.6 % 6.8  6.1    Assessment/Plan: 1. Pain due to onychomycosis of toenails of both feet    The mycotic toenails were sharply debrided x10 with sterile nail nippers and a power debriding burr to decrease bulk/thickness and length.    Return in about 3 months (around 03/19/2024) for RFC.   Joe Murders, DPM, FACFAS [facility]     [address] [place], [place] [address]                Office [phone]  Fax [phone]

## 2023-12-25 DIAGNOSIS — C434 Malignant melanoma of scalp and neck: Secondary | ICD-10-CM | POA: Diagnosis not present

## 2023-12-27 ENCOUNTER — Telehealth (HOSPITAL_BASED_OUTPATIENT_CLINIC_OR_DEPARTMENT_OTHER): Payer: Self-pay | Admitting: Nurse Practitioner

## 2023-12-27 DIAGNOSIS — I2510 Atherosclerotic heart disease of native coronary artery without angina pectoris: Secondary | ICD-10-CM | POA: Diagnosis not present

## 2023-12-27 DIAGNOSIS — N179 Acute kidney failure, unspecified: Secondary | ICD-10-CM | POA: Diagnosis not present

## 2023-12-27 DIAGNOSIS — E1151 Type 2 diabetes mellitus with diabetic peripheral angiopathy without gangrene: Secondary | ICD-10-CM | POA: Diagnosis not present

## 2023-12-27 DIAGNOSIS — R001 Bradycardia, unspecified: Secondary | ICD-10-CM | POA: Diagnosis not present

## 2023-12-27 DIAGNOSIS — I5021 Acute systolic (congestive) heart failure: Secondary | ICD-10-CM | POA: Diagnosis not present

## 2023-12-27 DIAGNOSIS — R9431 Abnormal electrocardiogram [ECG] [EKG]: Secondary | ICD-10-CM | POA: Diagnosis not present

## 2023-12-27 DIAGNOSIS — I480 Paroxysmal atrial fibrillation: Secondary | ICD-10-CM | POA: Diagnosis not present

## 2023-12-27 DIAGNOSIS — I110 Hypertensive heart disease with heart failure: Secondary | ICD-10-CM | POA: Diagnosis not present

## 2023-12-27 NOTE — Telephone Encounter (Signed)
 Received call from Lyndal Sandy, NP from [facility] with concerns about patient's EKG and episodes of bradycardia. She thinks patient needs to be seen soon. We were able to find an appointment with Neomi Banks, NP for 6/20. Acie Holiday contacted the pt and he was thankful for the appointment.

## 2023-12-29 ENCOUNTER — Other Ambulatory Visit (HOSPITAL_BASED_OUTPATIENT_CLINIC_OR_DEPARTMENT_OTHER)

## 2023-12-29 ENCOUNTER — Encounter (HOSPITAL_BASED_OUTPATIENT_CLINIC_OR_DEPARTMENT_OTHER): Payer: Self-pay | Admitting: Family

## 2023-12-29 ENCOUNTER — Ambulatory Visit (HOSPITAL_BASED_OUTPATIENT_CLINIC_OR_DEPARTMENT_OTHER): Admitting: Family

## 2023-12-29 VITALS — BP 124/62 | HR 76 | Ht 67.0 in | Wt 137.0 lb

## 2023-12-29 DIAGNOSIS — I25118 Atherosclerotic heart disease of native coronary artery with other forms of angina pectoris: Secondary | ICD-10-CM | POA: Diagnosis not present

## 2023-12-29 DIAGNOSIS — E785 Hyperlipidemia, unspecified: Secondary | ICD-10-CM | POA: Diagnosis not present

## 2023-12-29 DIAGNOSIS — I4811 Longstanding persistent atrial fibrillation: Secondary | ICD-10-CM

## 2023-12-29 DIAGNOSIS — G4733 Obstructive sleep apnea (adult) (pediatric): Secondary | ICD-10-CM

## 2023-12-29 DIAGNOSIS — I5022 Chronic systolic (congestive) heart failure: Secondary | ICD-10-CM

## 2023-12-29 DIAGNOSIS — I480 Paroxysmal atrial fibrillation: Secondary | ICD-10-CM

## 2023-12-29 NOTE — Patient Instructions (Addendum)
 Medication Instructions:  Your physician recommends that you continue on your current medications as directed. Please refer to the Current Medication list given to you today  Testing/Procedures: Your physician has recommended that you wear a Zio monitor.   This monitor is a medical device that records the heart's electrical activity. Doctors most often use these monitors to diagnose arrhythmias. Arrhythmias are problems with the speed or rhythm of the heartbeat. The monitor is a small device applied to your chest. You can wear one while you do your normal daily activities. While wearing this monitor if you have any symptoms to push the button and record what you felt. Once you have worn this monitor for the period of time provider prescribed (Usually 14 days), you will return the monitor device in the postage paid box. Once it is returned they will download the data collected and provide us  with a report which the provider will then review and we will call you with those results. Important tips:  Avoid showering during the first 24 hours of wearing the monitor. Avoid excessive sweating to help maximize wear time. Do not submerge the device, no hot tubs, and no swimming pools. Keep any lotions or oils away from the patch. After 24 hours you may shower with the patch on. Take brief showers with your back facing the shower head.  Do not remove patch once it has been placed because that will interrupt data and decrease adhesive wear time. Push the button when you have any symptoms and write down what you were feeling. Once you have completed wearing your monitor, remove and place into box which has postage paid and place in your outgoing mailbox.  If for some reason you have misplaced your box then call our office and we can provide another box and/or mail it off for you.  Follow-Up: At [facility], you and your health needs are our priority.  As part of our continuing mission to provide you  with exceptional heart care, our providers are all part of one team.  This team includes your primary Cardiologist (physician) and Advanced Practice Providers or APPs (Physician Assistants and Nurse Practitioners) who all work together to provide you with the care you need, when you need it.  Your next appointment:

## 2023-12-29 NOTE — Progress Notes (Signed)
 Cardiology Office Note   Date:  12/29/2023  ID:  Nathan [name], DOB 1946/05/18, MRN [id number] PCP: Nathan Broad, MD  Nathan [name] Cardiologist:  Nathan Gathers, MD     History of Present Illness Nathan [name] is a 78 y.o. male with hx of CAD s/p CABG /2010, AVR 2022, prior GI bleed 2010 due to gastric ulcer, DM2, HLD, obesity, OSA, persistent atrial fibrillation, HFrEF.   Most recent LHC with patent LIMA-LAD, SVG-large OM, occluded SVG-PDA with collaterals treated medically. Echo 07/2023 LVEF 35-40%, LV global hypokinesis, RV function normal, severe LAE, aortic bioprosthetic valve functioning appropriately. Jardiance  previously discontinued due to history of necrotizine fascitis and concern about infection. Anticoagulation has been deferred due to prior spontaneous bleed requiring 3u PRBC.   He was last seen 06/2023 doing well from cardiac perspective and recommended to follow up in 1 year.   Admitted 1/3-1/15/25 with septic shock secondary to e.coli bacteremia in setting of acute on chronic calculus cholecystitis. Statin was held due to elevated LFT's. GDMT limited by hypotension. Recommended to avoid Spironolactone , hydrochlorothiazide  due to AKI with peak creatinine 3.3. He was discharged on Toprol  100mg  daily for BP control.   Presents today for follow up with his wife. At visit with oncology team for wound check on scalp was noted to have HR 40-70bpm on pulse oximeter. Reports EKG at PCP office concerning for atrial fibrillation (not available for my review). Has been checking HR with home pulse ox with readings 40-70s. No lightheadedness, dizziness, near syncope, syncope, nor fatigue. He reports he has been feeling well. We reviewed known diagnosis of atrial fibrillation and likelihood his pulse oximeter is erroneously reading heart rate. By EKG HR 78bpm while home pulse ox showed 40 bpm. Discussed that often pulse oximeter not accurate in atrial fibrillation. He is  unaware of atrial fib with no palpitations. Prior monitor 04/2023 with 100% atrial fibrillation burden with average HR 71 bpm.   ROS: Please see the history of present illness.    All other systems reviewed and are negative.   Studies Reviewed      Cardiac Studies & Procedures   ______________________________________________________________________________________________ CARDIAC CATHETERIZATION  CARDIAC CATHETERIZATION 03/01/2016  Conclusion  Mid RCA lesion, 50 %stenosed.  Mid RCA to Dist RCA lesion, 100 %stenosed.  Prox Cx to Mid Cx lesion, 99 %stenosed.  Prox LAD to Mid LAD lesion, 100 %stenosed.  SVG to RCA is occluded  Origin lesion, 100 %stenosed.  SVG to OM2 is widely patent.  LIMA and is normal in caliber and anatomically normal.  1. Severe 3 vessel occlusive CAD 2. Patent LIMA to the LAD 3. Patent SVG to large OM2 4. Occluded SVG to PDA. Distal RCA is occluded and fills by left to right collaterals.  Plan: recommend medical management.  Findings Coronary Findings Diagnostic  Dominance: Right  Left Anterior Descending  Left Circumflex  First Obtuse Marginal Branch Vessel is small in size.  Second Obtuse Marginal Branch Vessel is large in size.  Right Coronary Artery Collaterals Dist RCA filled by collaterals from Dist Cx.  The lesion is severely calcified. with left-to-right collateral flow. The lesion is severely calcified.  Saphenous Graft To RPDA SVG.  Saphenous Graft To 2nd Mrg SVG.  LIMA LIMA Graft To Dist LAD LIMA and is normal in caliber and anatomically normal.  Intervention  No interventions have been documented.   STRESS TESTS  MYOCARDIAL PERFUSION IMAGING 02/23/2016  Narrative  Nuclear stress EF: 58%. Apical hypokinesis.  There was no ST  segment deviation noted during stress.  Defect 1: There is a medium defect of moderate severity present in the mid anterior, mid anteroseptal and mid anterolateral location. This  defect is partially fixed however does demonstrate some reversibility along the anterior/anterolateral distribution.  Findings consistent with prior myocardial infarction with peri-infarct ischemia.  This is an intermediate risk study.  Please have him come back to clinic to discuss possibility of cardiac catheterization given his underlying anginal symptoms. Nathan Gathers, MD   ECHOCARDIOGRAM  ECHOCARDIOGRAM LIMITED 07/15/2023  Narrative ECHOCARDIOGRAM LIMITED REPORT    Patient Name:   Nathan [name] Date of Exam: 07/15/2023 Medical Rec #:  [id number]      Height:       67.0 in Accession #:    [id number]     Weight:       128.1 lb Date of Birth:  July 02, 1946     BSA:          1.673 m Patient Age:    77 years       BP:           102/65 mmHg Patient Gender: M              HR:           103 bpm. Exam Location:  Inpatient  Procedure: Limited Echo, Color Doppler, Cardiac Doppler and Intracardiac Opacification Agent  Indications:    Congestive Heart Failure I50.9  History:        Patient has prior history of Echocardiogram examinations, most recent 03/06/2023. Aortic Valve Disease, Signs/Symptoms:Murmur; Risk Factors:Hypertension and Sleep Apnea. Aortic Valve: 23 mm Edwards bioprosthetic valve is present in the aortic position.  Sonographer:    Nathan [name] Referring Phys: [id number] Nathan [name]  IMPRESSIONS   1. Limited study. 2. Left ventricular ejection fraction, by estimation, is 35 to 40%. The left ventricle has moderately decreased function. The left ventricle demonstrates global hypokinesis, more prominent mid to distal anteroseptal wall. The left ventricular internal cavity size was mildly dilated. 3. No LV mural thrombus noted with Definity  contrast. 4. Right ventricular systolic function is normal. The right ventricular size is normal. 5. Left atrial size was severely dilated. 6. The mitral valve is degenerative. Moderate mitral annular calcification. 7. The aortic  valve has been repaired/replaced. Aortic valve regurgitation is not visualized. There is a 23 mm Edwards bioprosthetic valve present in the aortic position. Aortic valve mean gradient measures 7.0 mmHg.  Comparison(s): Prior images reviewed side by side. LVEF stable in range of 35-40%.  FINDINGS Left Ventricle: Left ventricular ejection fraction, by estimation, is 35 to 40%. The left ventricle has moderately decreased function. The left ventricle demonstrates global hypokinesis. The left ventricular internal cavity size was mildly dilated. There is no concentric left ventricular hypertrophy.  Right Ventricle: The right ventricular size is normal. No increase in right ventricular wall thickness. Right ventricular systolic function is normal.  Left Atrium: Left atrial size was severely dilated.  Mitral Valve: The mitral valve is degenerative in appearance. Moderate mitral annular calcification.  Aortic Valve: The aortic valve has been repaired/replaced. Aortic valve regurgitation is not visualized. Aortic valve mean gradient measures 7.0 mmHg. Aortic valve peak gradient measures 14.1 mmHg. Aortic valve area, by VTI measures 1.50 cm. There is a 23 mm Edwards bioprosthetic valve present in the aortic position.  LEFT VENTRICLE PLAX 2D LVIDd:         5.70 cm LVIDs:         4.70 cm LV PW:  0.90 cm LV IVS:        0.80 cm LVOT diam:     1.80 cm LV SV:         45 LV SV Index:   27 LVOT Area:     2.54 cm   LEFT ATRIUM              Index LA Vol (A2C):   142.0 ml 84.86 ml/m LA Vol (A4C):   119.0 ml 71.11 ml/m LA Biplane Vol: 133.0 ml 79.48 ml/m AORTIC VALVE AV Area (Vmax):    1.23 cm AV Area (Vmean):   1.34 cm AV Area (VTI):     1.50 cm AV Vmax:           188.00 cm/s AV Vmean:          129.000 cm/s AV VTI:            0.301 m AV Peak Grad:      14.1 mmHg AV Mean Grad:      7.0 mmHg LVOT Vmax:         90.70 cm/s LVOT Vmean:        68.100 cm/s LVOT VTI:          0.178  m LVOT/AV VTI ratio: 0.59   SHUNTS Systemic VTI:  0.18 m Systemic Diam: 1.80 cm  Teddie Favre MD Electronically signed by Teddie Favre MD Signature Date/Time: 07/15/2023/5:30:13 PM    Final    MONITORS  LONG TERM MONITOR (3-14 DAYS) 05/10/2023  Narrative   Atrial fibrillation 100%-average heart rate 73 bpm   Rare PVCs   6 nonsustained ventricular tachycardia runs longest was 12 beats at 193 bpm.  Asymptomatic.   Patch Wear Time:  13 days and 23 hours (2024-10-10T11:12:57-0400 to 2024-10-24T11:12:49-0400)  6 Ventricular Tachycardia runs occurred, the run with the fastest interval lasting 12 beats with a max rate of 211 bpm (avg 193 bpm); the run with the fastest interval was also the longest. Atrial Fibrillation occurred continuously (100% burden), ranging from 42-132 bpm (avg of 73 bpm). Isolated VEs were rare (<1.0%, 10205), VE Couplets were rare (<1.0%, 150), and VE Triplets were rare (<1.0%, 8). Ventricular Bigeminy and Trigeminy were present.       ______________________________________________________________________________________________      Risk Assessment/Calculations  CHA2DS2-VASc Score = 6   This indicates a 9.7% annual risk of stroke. The patient's score is based upon: CHF History: 1 HTN History: 1 Diabetes History: 1 Stroke History: 0 Vascular Disease History: 1 Age Score: 2 Gender Score: 0            Physical Exam VS:  BP 124/62   Pulse 76   Ht 5' 7 (1.702 m)   Wt 137 lb (62.1 kg)   SpO2 97%   BMI 21.46 kg/m        Wt Readings from Last 3 Encounters:  12/29/23 137 lb (62.1 kg)  07/26/23 130 lb 8.2 oz (59.2 kg)  06/26/23 127 lb 3.2 oz (57.7 kg)    GEN: Well nourished, overweight, well developed in no acute distress NECK: No JVD; No carotid bruits CARDIAC: IRIR, no murmurs, rubs, gallops RESPIRATORY:  Clear to auscultation without rales, wheezing or rhonchi  ABDOMEN: Soft, non-tender, non-distended EXTREMITIES:  No edema; No  deformity   ASSESSMENT AND PLAN  CAD s/p CABG / HLD, LDL goal <70 - Stable with no anginal symptoms. No indication for ischemic evaluation.  Continue GDMT Aspirin  81mg  daily, Atorvastatin  20mg  daily, Toprol  100mg  daily, PRN Nitroglycerin .  Our med list does not match recent fill at pharmacy. Will request med list from PCP.  Recommend aiming for 150 minutes of moderate intensity activity per week and following a heart healthy diet.    HFrEF - Euvolemic and well compensated on exam. No indication for diuretic at this time.  Our med list does not match recent fill at pharmacy. Will request med list from PCP.  Low sodium diet, fluid restriction <2L, and daily weights encouraged. Educated to contact our office for weight gain of 2 lbs overnight or 5 lbs in one week.  Permanent atrial fibrillation - not OAC candidate due to significant bleeding history (including spontaneous right adductor muscle bleed requiring 3u PRBC). Risk of OAC outweigh benefit. EKG today rate controlled atrial fibrillation.  Discussed in atrial fib, pulse ox often not accurate in measuring heart rate and reassurance provided.  Will place one week monitor to ensure no bradycardia nor pauses and to ensure Metoprolol  dosing correct.  DM2 / OSA - Continue to follow with PCP.        Dispo: follow up pending monitor results (anticipate will be able to follow up 06/2024 per prior recommendation)  Signed, Clearnce Curia, NP

## 2024-01-01 ENCOUNTER — Telehealth (HOSPITAL_BASED_OUTPATIENT_CLINIC_OR_DEPARTMENT_OTHER): Payer: Self-pay

## 2024-01-01 DIAGNOSIS — I480 Paroxysmal atrial fibrillation: Secondary | ICD-10-CM | POA: Diagnosis not present

## 2024-01-01 DIAGNOSIS — D510 Vitamin B12 deficiency anemia due to intrinsic factor deficiency: Secondary | ICD-10-CM | POA: Diagnosis not present

## 2024-01-01 DIAGNOSIS — D649 Anemia, unspecified: Secondary | ICD-10-CM | POA: Diagnosis not present

## 2024-01-01 DIAGNOSIS — I5021 Acute systolic (congestive) heart failure: Secondary | ICD-10-CM | POA: Diagnosis not present

## 2024-01-01 DIAGNOSIS — E1151 Type 2 diabetes mellitus with diabetic peripheral angiopathy without gangrene: Secondary | ICD-10-CM | POA: Diagnosis not present

## 2024-01-01 DIAGNOSIS — N179 Acute kidney failure, unspecified: Secondary | ICD-10-CM | POA: Diagnosis not present

## 2024-01-01 DIAGNOSIS — E611 Iron deficiency: Secondary | ICD-10-CM | POA: Diagnosis not present

## 2024-01-01 DIAGNOSIS — R001 Bradycardia, unspecified: Secondary | ICD-10-CM | POA: Diagnosis not present

## 2024-01-01 DIAGNOSIS — R9431 Abnormal electrocardiogram [ECG] [EKG]: Secondary | ICD-10-CM | POA: Diagnosis not present

## 2024-01-01 DIAGNOSIS — I2510 Atherosclerotic heart disease of native coronary artery without angina pectoris: Secondary | ICD-10-CM | POA: Diagnosis not present

## 2024-01-01 NOTE — Telephone Encounter (Addendum)
 Call 6/23  ----- Message from Reche GORMAN Finder sent at 12/29/2023  8:41 PM EDT ----- His med list does not match his pharmacy dispense report which likely does not match what PCP has. Can we call and get request of med list from PCP? And maybe cal pharmacy to see when he filled stuff?  Most interested in: Atorvastatin , Metoprolol , Spironolactone , hydrochlorothiazide , Isordil , Minoxidil  Sorry - it is kind of a mess

## 2024-01-01 NOTE — Telephone Encounter (Signed)
 Called PCP office, transferred to medical records-left VM to have medication list faxed   Uses [facility] in [place] [phone]  PCP medication list:  Hydrochlorothiazide  25mg  daily am  Asa 81mg  every day  Atorv 20mg  daily (has note  not in fill hx- taking per pt call 11/04/22)  Imdur  30 mg every day Minoxidil 10mg  every day  Spiro 12.5mg  ( half tablet) every day  nitroglycerin  0.4mg  prn  Metop succ ER 100mg  every day     Per pharmacy:  Atorvastatin  20 mg daily- not on file  Metoprolol  100mg  daily- 5/5- 90 tabs Sprio 25mg  ( half tablet daily)  2/4- 45 tabs  Hydrochlorothiazide  12.5mg  3/23 90 tabs  Imdur  30mg  daily- 04/16/23 90 tabs (no isordil )  Minoxidil 10mg  3/20 - 90 tabs  Nitroglycerin  04/12/2023- 50 tabs  metop tartrate  50mg  twice daily 10/20/2022 180 tabs

## 2024-01-08 DIAGNOSIS — E1151 Type 2 diabetes mellitus with diabetic peripheral angiopathy without gangrene: Secondary | ICD-10-CM | POA: Diagnosis not present

## 2024-01-08 DIAGNOSIS — D510 Vitamin B12 deficiency anemia due to intrinsic factor deficiency: Secondary | ICD-10-CM | POA: Diagnosis not present

## 2024-01-08 DIAGNOSIS — D649 Anemia, unspecified: Secondary | ICD-10-CM | POA: Diagnosis not present

## 2024-01-08 DIAGNOSIS — I480 Paroxysmal atrial fibrillation: Secondary | ICD-10-CM | POA: Diagnosis not present

## 2024-01-08 DIAGNOSIS — I5021 Acute systolic (congestive) heart failure: Secondary | ICD-10-CM | POA: Diagnosis not present

## 2024-01-08 DIAGNOSIS — R195 Other fecal abnormalities: Secondary | ICD-10-CM | POA: Diagnosis not present

## 2024-01-08 DIAGNOSIS — N179 Acute kidney failure, unspecified: Secondary | ICD-10-CM | POA: Diagnosis not present

## 2024-01-08 DIAGNOSIS — R9431 Abnormal electrocardiogram [ECG] [EKG]: Secondary | ICD-10-CM | POA: Diagnosis not present

## 2024-01-08 DIAGNOSIS — I2510 Atherosclerotic heart disease of native coronary artery without angina pectoris: Secondary | ICD-10-CM | POA: Diagnosis not present

## 2024-01-08 DIAGNOSIS — R001 Bradycardia, unspecified: Secondary | ICD-10-CM | POA: Diagnosis not present

## 2024-01-10 ENCOUNTER — Telehealth: Payer: Self-pay | Admitting: Physician Assistant

## 2024-01-10 NOTE — Telephone Encounter (Signed)
 Inbound call from patient stating he was advised by Dr. Verta to contact his GI provider due to having low hemoglobin of 8.4. Next available appointment is 7/22. Requesting to discuss sooner options. Please advise, thank you

## 2024-01-10 NOTE — Telephone Encounter (Signed)
 Called and spoke with patient. I informed patient that his Hgb seems to be stable over the last 5 months, patient states that he has not had any labs drawn within the last few months. Patient has been scheduled for a follow up with Alan Coombs, PA on Tuesday, 01/16/24 at 8:40 am. I provided patient with the office address. Patient verbalized understanding and had no concerns at the end of the call.

## 2024-01-15 DIAGNOSIS — N179 Acute kidney failure, unspecified: Secondary | ICD-10-CM | POA: Diagnosis not present

## 2024-01-15 DIAGNOSIS — D649 Anemia, unspecified: Secondary | ICD-10-CM | POA: Diagnosis not present

## 2024-01-15 NOTE — Progress Notes (Unsigned)
 01/16/2024 Nathan [name] [id number] 1946/04/02  Referring provider: Yolande Toribio MATSU, MD Primary GI doctor: Dr. Albertus  ASSESSMENT AND PLAN:  Anemia without IDA in July 2024, continuing anemia, no overt GI bleeding, per patient 1 out of 3 Hemoccult cards slightly positive 07/26/2023  HGB 8.8 MCV 86.9 Platelets 179 01/26/2023 Iron 80 Ferritin 73.0  Recent Labs    07/16/23 0307 07/17/23 0259 07/18/23 0258 07/19/23 0255 07/21/23 0252 07/22/23 0323 07/23/23 0516 07/24/23 0430 07/25/23 0455 07/26/23 0501  HGB 8.6* 8.0* 9.6* 8.3* 8.6* 7.3* 8.3* 8.5* 9.0* 8.8*  Patient is on B12 injections once a month he is not on iron supplementation Patient with chronic cholecystitis PERC tube in August, weakness and appears fluid overloaded on exam today. When last checked in July patient did not have an iron deficiency, now with a thrombocytopenia as well as an anemia, no splenomegaly and normal liver on multiple cross-sectional imaging. I will recheck an iron and ferritin however I am more suspicious that this is anemia of chronic disease versus bone marrow suppression.  Will also check SPEP, reticulocyte count.  ] Due to patient's weakness and multiple comorbid conditions I would suggest supportive care rather than endoscopic evaluation if patient has iron deficiency anemia unless patient has overt GI bleeding or not responsive to iron.  DOE/cough with abnormal breath sounds and 2+ edema Will check BNP and chest x-ray ER precautions discussed with patient Close follow-up cardiology   Elevated LFTs with recent acute and chronic calculus cholecystitis, s/p [facility] 02/2023, not a surgical candidate No current AB pain, some mild nausea last seconds    Latest Ref Rng & Units 07/26/2023    5:01 AM 07/25/2023    4:55 AM 07/24/2023    4:30 AM  Hepatic Function  Total Protein 6.5 - 8.1 g/dL 5.2  5.4  5.2   Albumin 3.5 - 5.0 g/dL 2.4  2.5  2.5   AST 15 - 41 U/L 94  125  115   ALT 0 - 44 U/L 18   16  15    Alk Phosphatase 38 - 126 U/L 284  263  161   Total Bilirubin 0.0 - 1.2 mg/dL 0.8  0.8  0.9   Status post cholecystostomy tube 03/02/2023 for acute cholecystitis 07/14/2023 CT abdomen pelvis with contrast for weakness shows inflammatory changes involving gallbladder consistent with acute cholecystitis CBD dilation, diverticulosis without diverticulitis 07/15/2023 HIDA scan with delayed gallbladder filling visualized during second hour of imaging consistent with chronic calculus cholecystitis patent CBD -Will discuss starting ursodiol with Dr. Albertus and if this would be beneficial -Low fat diet -Recheck LFTs and INR  History of gastric ulcer Rare nausea, no vomiting,  melena Lifestyle changes discussed, avoid NSAIDS, ETOH Weight loss discussed with the patient Smoking cessation discussed in detail Well controlled on medication -on pantoprazole  40 mg once a day switched from omeprazole , - continue PPI BID   CAD status post bypass HFrEF Persistent atrial fibrillation cardiac cath 02/2016 with occluded SVG to RCA, severe 3 vessel CAD, patent LIMA to LAD, patent SVG to large OM2 07/15/2023 echo ejection fraction 35 to 40%, normal RVSP, status post aortic valve repair, no aortic stenosis Not on anticoagulation due to history of spontaneous bleeding requiring 3 units  Diverticulitis of colon, no fever, chills, vomiting 01/21/2023 diverticulitis on CT treated with Augmentin  Last colon was 2018 recall 3 years but patient states he did not come back for recall due to his heart history Due to comorbid conditions not a  candidate for EGD colon. No symptoms at this time   Type 2 diabetes mellitus with hyperlipidemia [facility]) On medication  Patient Care Team: Yolande Toribio MATSU, MD as PCP - General (Internal Medicine) Jeffrie Oneil BROCKS, MD as PCP - Cardiology (Cardiology) Rubin Calamity, MD as Consulting Physician (General Surgery) Pyrtle, Gordy HERO, MD as Consulting Physician  (Gastroenterology) Dohmeier, Dedra, MD as Consulting Physician (Neurology)  HISTORY OF PRESENT ILLNESS: 78 y.o. male with a past medical history listed below presents for evaluation of anemia.   Patient was last seen in the office 01/26/2023 by myself for history of diverticulitis treated with Augmentin .  Discussed the use of AI scribe software for clinical note transcription with the patient, who gave verbal consent to proceed.  History of Present Illness   Nathan [name] is a 78 year old male with atrial fibrillation who presents with low hemoglobin and thrombocytopenia. He is accompanied by his wife. He was referred by [facility] for evaluation of low hemoglobin.  His hemoglobin was low during his last visit in July, but there was no iron deficiency at that time. He is on B12 injections monthly but not taking an iron supplement. Recent tests showed a small amount of blood in one out of three stool samples. No black tarry stools. Occasional nausea without vomiting, not associated with specific foods. No fever, chills, or significant abdominal pain. Bowel movements are regular, occurring once or twice daily, without loose stools.  He has a history of atrial fibrillation and wore a heart monitor for a week, with results pending. He is unsure about his next cardiologist appointment but recalls it might be in December.  He underwent a HIDA scan in January, which showed a normal spleen and liver. He had a colostomy tube placed in August of the previous year due to gallbladder issues, but the tube has since been removed. He still has his gallbladder.  He reports significant weakness and swelling in his legs, with the swelling persisting since his last hospital discharge. No pain, warmth, or redness in the legs.  He is currently taking pantoprazole  40 mg twice daily and vitamin D3. No shortness of breath but some coughing. He has a history of melanoma on his scalp, getting evaluation there.  He   reports that he has never smoked. He has never used smokeless tobacco. He reports that he does not drink alcohol  and does not use drugs.  RELEVANT GI HISTORY, IMAGING AND LABS: Results   LABS Hemoglobin: low Platelets: 132 Fecal occult blood test: positive in 1 out of 3 samples (12/2023)  RADIOLOGY Abdominal ultrasound: normal liver, no splenomegaly (07/2023)      CBC    Component Value Date/Time   WBC 7.7 07/26/2023 0501   RBC 3.05 (L) 07/26/2023 0501   HGB 8.8 (L) 07/26/2023 0501   HGB 11.3 (L) 04/20/2023 1126   HCT 26.5 (L) 07/26/2023 0501   HCT 36.2 (L) 04/20/2023 1126   PLT 179 07/26/2023 0501   PLT 175 04/20/2023 1126   MCV 86.9 07/26/2023 0501   MCV 96 04/20/2023 1126   MCH 28.9 07/26/2023 0501   MCHC 33.2 07/26/2023 0501   RDW 20.6 (H) 07/26/2023 0501   RDW 16.4 (H) 04/20/2023 1126   LYMPHSABS 1.5 07/26/2023 0501   MONOABS 0.7 07/26/2023 0501   EOSABS 0.2 07/26/2023 0501   BASOSABS 0.0 07/26/2023 0501   Recent Labs    07/16/23 9692 07/17/23 0259 07/18/23 0258 07/19/23 0255 07/21/23 0252 07/22/23 0323 07/23/23 0516 07/24/23 0430 07/25/23  0455 07/26/23 0501  HGB 8.6* 8.0* 9.6* 8.3* 8.6* 7.3* 8.3* 8.5* 9.0* 8.8*    CMP     Component Value Date/Time   NA 136 07/26/2023 0501   NA 135 04/20/2023 1126   K 3.6 07/26/2023 0501   CL 103 07/26/2023 0501   CO2 27 07/26/2023 0501   GLUCOSE 88 07/26/2023 0501   BUN 28 (H) 07/26/2023 0501   BUN 37 (H) 04/20/2023 1126   CREATININE 1.36 (H) 07/26/2023 0501   CREATININE 1.92 (H) 03/20/2023 1333   CALCIUM  8.7 (L) 07/26/2023 0501   PROT 5.2 (L) 07/26/2023 0501   PROT 6.5 07/20/2021 0846   ALBUMIN 2.4 (L) 07/26/2023 0501   ALBUMIN 4.5 07/20/2021 0846   AST 94 (H) 07/26/2023 0501   ALT 18 07/26/2023 0501   ALKPHOS 284 (H) 07/26/2023 0501   BILITOT 0.8 07/26/2023 0501   BILITOT 0.5 07/20/2021 0846   GFRNONAA 53 (L) 07/26/2023 0501   GFRAA 59 (L) 04/04/2018 1604      Latest Ref Rng & Units 07/26/2023     5:01 AM 07/25/2023    4:55 AM 07/24/2023    4:30 AM  Hepatic Function  Total Protein 6.5 - 8.1 g/dL 5.2  5.4  5.2   Albumin 3.5 - 5.0 g/dL 2.4  2.5  2.5   AST 15 - 41 U/L 94  125  115   ALT 0 - 44 U/L 18  16  15    Alk Phosphatase 38 - 126 U/L 284  263  161   Total Bilirubin 0.0 - 1.2 mg/dL 0.8  0.8  0.9       Current Medications:   Current Outpatient Medications (Endocrine & Metabolic):    metFORMIN (GLUCOPHAGE) 1000 MG tablet, Take 1,000 mg by mouth in the morning and at bedtime.  Current Outpatient Medications (Cardiovascular):    atorvastatin  (LIPITOR) 20 MG tablet, Take 1 tablet (20 mg total) by mouth daily.   isosorbide  dinitrate (ISORDIL ) 30 MG tablet, Take 30 mg by mouth daily.   metoprolol  succinate (TOPROL -XL) 100 MG 24 hr tablet, Take 1 tablet (100 mg total) by mouth daily. Take with or immediately following a meal.   minoxidil (LONITEN) 10 MG tablet, Take 10 mg by mouth daily.   nitroGLYCERIN  (NITROSTAT ) 0.4 MG SL tablet, Place 1 tablet under the tongue as needed.   Current Outpatient Medications (Analgesics):    allopurinol  (ZYLOPRIM ) 300 MG tablet, Take 300 mg by mouth daily.   aspirin  EC 81 MG tablet, Take 81 mg by mouth daily. Swallow whole. (Patient not taking: Reported on 01/16/2024)   Current Outpatient Medications (Other):    Coenzyme Q10 (COQ10) 100 MG CAPS, Take 100 mg by mouth in the morning and at bedtime.   fish oil-omega-3 fatty acids 1000 MG capsule, Take 1 g by mouth 2 (two) times daily.   latanoprost  (XALATAN ) 0.005 % ophthalmic solution, Place 1 drop into both eyes at bedtime.   pantoprazole  (PROTONIX ) 40 MG tablet, Take 40 mg by mouth 2 (two) times daily.  Medical History:  Past Medical History:  Diagnosis Date   Allergy    Aortic stenosis    Arthritis    Balance problem 04/26/2022   Blood transfusion without reported diagnosis    CAD (coronary artery disease)    Cataract    both eyes surgically removed   Diabetes mellitus, type 2 (HCC)     Diverticulosis of colon (without mention of hemorrhage)    Duodenal ulcer perforation (HCC)    blood  transfusion   Flesh-eating bacteria (HCC) 1995   GERD (gastroesophageal reflux disease)    Glaucoma    pre glaucoma on Xalatan  drops   Heart murmur    History of diabetic retinopathy    History of gastrointestinal bleeding    History of necrotizing fasciitis    History of prosthetic aortic valve    Hypercholesteremia    Myocardial infarction (HCC) 02/23/2016   Neuropathic pain 04/26/2022   Nightmares    CHRONIC   OSA (obstructive sleep apnea)    borderline no CPAP    Personal history of colonic polyps 11/23/2009   TUBULAR ADENOMA   Postoperative anemia    Sinus bradycardia    Sleep apnea    no cpap   Stomach ulcer    Hx of   Systolic hypertension    Vitamin B12 deficiency    Allergies:  Allergies  Allergen Reactions   Shellfish-Derived Products Anaphylaxis   Nsaids Other (See Comments)    H/o gastric ulcers = NO NSAIDs per GI   Zolpidem Tartrate Other (See Comments)    Hallucinations     Surgical History:  He  has a past surgical history that includes Retinal laser procedure; Coronary artery bypass graft (2010); Scrotal surgery (1995); Hernia repair (1972); Aortic valve replacement; flesh eating disease surgery ; Umbilical hernia repair (N/A, 04/10/2013); Cataract extraction w/ intraocular lens implant; Blepharoplasty (11/24/2014); Cardiac catheterization (N/A, 03/01/2016); Colonoscopy; Polypectomy; IR Perc Cholecystostomy (03/02/2023); IR EXCHANGE BILIARY DRAIN (03/07/2023); and IR EXCHANGE BILIARY DRAIN (04/18/2023). Family History:  His family history includes Alcohol  abuse in his father; Cirrhosis in his father; Colon cancer (age of onset: 39) in his mother; Diabetes in his mother and sister; Hypertension in his mother and sister; Rectal cancer in his mother.  REVIEW OF SYSTEMS  : All other systems reviewed and negative except where noted in the History of Present  Illness.  PHYSICAL EXAM: BP 110/60   Pulse (!) 53   Ht 5' 7 (1.702 m)   Wt 142 lb 12.8 oz (64.8 kg)   BMI 22.37 kg/m  Physical Exam   GENERAL APPEARANCE: Elderly, frail, in no apparent distress. HEENT: No cervical lymphadenopathy, unremarkable thyroid , sclerae anicteric, conjunctiva pink. RESPIRATORY: Respiratory effort normal, possible fluid in lungs with auscultation suggesting congestion. CARDIO: Irregularly irregular pulse, likely atrial fibrillation, peripheral pulses intact. ABDOMEN: Soft, non-distended, active bowel sounds in all 4 quadrants, no tenderness to palpation, umbilical hernia present. RECTAL: Declines. MUSCULOSKELETAL: Full ROM, antalgic, unsteady gait  SKIN: Dry, intact without rashes or lesions. No jaundice. NEURO: Alert, oriented, no focal deficits. PSYCH: Cooperative, normal mood and affect. EXTREMITIES: 1+ edema in left leg, 2+ edema in right leg, no pain on palpation of legs.  Negative Homan     Alan JONELLE Coombs, PA-C 9:12 AM

## 2024-01-16 ENCOUNTER — Ambulatory Visit (INDEPENDENT_AMBULATORY_CARE_PROVIDER_SITE_OTHER)
Admission: RE | Admit: 2024-01-16 | Discharge: 2024-01-16 | Disposition: A | Discharge status: Home / Self Care | Source: Ambulatory Visit | Attending: Physician Assistant | Admitting: Physician Assistant

## 2024-01-16 ENCOUNTER — Emergency Department (HOSPITAL_COMMUNITY)

## 2024-01-16 ENCOUNTER — Other Ambulatory Visit: Payer: Self-pay

## 2024-01-16 ENCOUNTER — Encounter (HOSPITAL_COMMUNITY): Payer: Self-pay

## 2024-01-16 ENCOUNTER — Emergency Department (HOSPITAL_COMMUNITY)
Admission: EM | Admit: 2024-01-16 | Discharge: 2024-01-16 | Disposition: A | Discharge status: Home / Self Care | Attending: Emergency Medicine | Admitting: Emergency Medicine

## 2024-01-16 ENCOUNTER — Ambulatory Visit: Admitting: Physician Assistant

## 2024-01-16 ENCOUNTER — Ambulatory Visit: Payer: Self-pay | Admitting: Physician Assistant

## 2024-01-16 ENCOUNTER — Encounter: Payer: Self-pay | Admitting: Physician Assistant

## 2024-01-16 ENCOUNTER — Other Ambulatory Visit (INDEPENDENT_AMBULATORY_CARE_PROVIDER_SITE_OTHER)

## 2024-01-16 VITALS — BP 110/60 | HR 53 | Ht 67.0 in | Wt 142.8 lb

## 2024-01-16 DIAGNOSIS — D696 Thrombocytopenia, unspecified: Secondary | ICD-10-CM

## 2024-01-16 DIAGNOSIS — K828 Other specified diseases of gallbladder: Secondary | ICD-10-CM | POA: Diagnosis not present

## 2024-01-16 DIAGNOSIS — D649 Anemia, unspecified: Secondary | ICD-10-CM

## 2024-01-16 DIAGNOSIS — I5020 Unspecified systolic (congestive) heart failure: Secondary | ICD-10-CM | POA: Diagnosis not present

## 2024-01-16 DIAGNOSIS — R0602 Shortness of breath: Secondary | ICD-10-CM | POA: Diagnosis not present

## 2024-01-16 DIAGNOSIS — I4891 Unspecified atrial fibrillation: Secondary | ICD-10-CM | POA: Diagnosis not present

## 2024-01-16 DIAGNOSIS — R0609 Other forms of dyspnea: Secondary | ICD-10-CM

## 2024-01-16 DIAGNOSIS — I509 Heart failure, unspecified: Secondary | ICD-10-CM | POA: Insufficient documentation

## 2024-01-16 DIAGNOSIS — R7989 Other specified abnormal findings of blood chemistry: Secondary | ICD-10-CM

## 2024-01-16 DIAGNOSIS — K8020 Calculus of gallbladder without cholecystitis without obstruction: Secondary | ICD-10-CM | POA: Diagnosis not present

## 2024-01-16 DIAGNOSIS — N179 Acute kidney failure, unspecified: Secondary | ICD-10-CM | POA: Insufficient documentation

## 2024-01-16 DIAGNOSIS — R799 Abnormal finding of blood chemistry, unspecified: Secondary | ICD-10-CM | POA: Diagnosis present

## 2024-01-16 DIAGNOSIS — Z7984 Long term (current) use of oral hypoglycemic drugs: Secondary | ICD-10-CM | POA: Diagnosis not present

## 2024-01-16 DIAGNOSIS — R600 Localized edema: Secondary | ICD-10-CM | POA: Diagnosis not present

## 2024-01-16 DIAGNOSIS — K8010 Calculus of gallbladder with chronic cholecystitis without obstruction: Secondary | ICD-10-CM | POA: Diagnosis not present

## 2024-01-16 DIAGNOSIS — I4819 Other persistent atrial fibrillation: Secondary | ICD-10-CM

## 2024-01-16 DIAGNOSIS — Z951 Presence of aortocoronary bypass graft: Secondary | ICD-10-CM | POA: Diagnosis not present

## 2024-01-16 DIAGNOSIS — K5730 Diverticulosis of large intestine without perforation or abscess without bleeding: Secondary | ICD-10-CM | POA: Diagnosis not present

## 2024-01-16 DIAGNOSIS — E785 Hyperlipidemia, unspecified: Secondary | ICD-10-CM

## 2024-01-16 DIAGNOSIS — R791 Abnormal coagulation profile: Secondary | ICD-10-CM | POA: Diagnosis not present

## 2024-01-16 DIAGNOSIS — I2510 Atherosclerotic heart disease of native coronary artery without angina pectoris: Secondary | ICD-10-CM | POA: Insufficient documentation

## 2024-01-16 DIAGNOSIS — Z7982 Long term (current) use of aspirin: Secondary | ICD-10-CM | POA: Insufficient documentation

## 2024-01-16 DIAGNOSIS — E119 Type 2 diabetes mellitus without complications: Secondary | ICD-10-CM | POA: Diagnosis not present

## 2024-01-16 DIAGNOSIS — I480 Paroxysmal atrial fibrillation: Secondary | ICD-10-CM

## 2024-01-16 DIAGNOSIS — E1169 Type 2 diabetes mellitus with other specified complication: Secondary | ICD-10-CM

## 2024-01-16 DIAGNOSIS — E538 Deficiency of other specified B group vitamins: Secondary | ICD-10-CM | POA: Diagnosis not present

## 2024-01-16 DIAGNOSIS — Z8711 Personal history of peptic ulcer disease: Secondary | ICD-10-CM

## 2024-01-16 DIAGNOSIS — I10 Essential (primary) hypertension: Secondary | ICD-10-CM | POA: Diagnosis not present

## 2024-01-16 DIAGNOSIS — K4090 Unilateral inguinal hernia, without obstruction or gangrene, not specified as recurrent: Secondary | ICD-10-CM | POA: Diagnosis not present

## 2024-01-16 LAB — CBC WITH DIFFERENTIAL/PLATELET
Abs Immature Granulocytes: 0.05 K/uL (ref 0.00–0.07)
Basophils Absolute: 0.1 K/uL (ref 0.0–0.1)
Basophils Absolute: 0 K/uL (ref 0.0–0.1)
Basophils Relative: 1.1 % (ref 0.0–3.0)
Basophils Relative: 1 %
Eosinophils Absolute: 0.2 K/uL (ref 0.0–0.7)
Eosinophils Absolute: 0.1 K/uL (ref 0.0–0.5)
Eosinophils Relative: 3.5 % (ref 0.0–5.0)
Eosinophils Relative: 2 %
HCT: 25.6 % — ABNORMAL LOW (ref 39.0–52.0)
HCT: 25.9 % — ABNORMAL LOW (ref 39.0–52.0)
Hemoglobin: 8.5 g/dL — ABNORMAL LOW (ref 13.0–17.0)
Hemoglobin: 8.4 g/dL — ABNORMAL LOW (ref 13.0–17.0)
Immature Granulocytes: 1 %
Lymphocytes Relative: 15.9 % (ref 12.0–46.0)
Lymphocytes Relative: 17 %
Lymphs Abs: 0.8 K/uL (ref 0.7–4.0)
Lymphs Abs: 0.9 K/uL (ref 0.7–4.0)
MCH: 29.4 pg (ref 26.0–34.0)
MCHC: 33.3 g/dL (ref 30.0–36.0)
MCHC: 32.4 g/dL (ref 30.0–36.0)
MCV: 89.1 fl (ref 78.0–100.0)
MCV: 90.6 fL (ref 80.0–100.0)
Monocytes Absolute: 0.5 K/uL (ref 0.1–1.0)
Monocytes Absolute: 0.6 K/uL (ref 0.1–1.0)
Monocytes Relative: 9.4 % (ref 3.0–12.0)
Monocytes Relative: 11 %
Neutro Abs: 3.7 K/uL (ref 1.4–7.7)
Neutro Abs: 4 K/uL (ref 1.7–7.7)
Neutrophils Relative %: 70.1 % (ref 43.0–77.0)
Neutrophils Relative %: 68 %
Platelets: 153 K/uL (ref 150.0–400.0)
Platelets: 151 K/uL (ref 150–400)
RBC: 2.88 Mil/uL — ABNORMAL LOW (ref 4.22–5.81)
RBC: 2.86 MIL/uL — ABNORMAL LOW (ref 4.22–5.81)
RDW: 18.6 % — ABNORMAL HIGH (ref 11.5–15.5)
RDW: 17.6 % — ABNORMAL HIGH (ref 11.5–15.5)
WBC: 5.2 K/uL (ref 4.0–10.5)
WBC: 5.7 K/uL (ref 4.0–10.5)
nRBC: 0 % (ref 0.0–0.2)

## 2024-01-16 LAB — URINALYSIS, ROUTINE W REFLEX MICROSCOPIC
Bilirubin Urine: NEGATIVE
Glucose, UA: NEGATIVE mg/dL
Hgb urine dipstick: NEGATIVE
Ketones, ur: NEGATIVE mg/dL
Leukocytes,Ua: NEGATIVE
Nitrite: NEGATIVE
Protein, ur: NEGATIVE mg/dL
Specific Gravity, Urine: 1.012 (ref 1.005–1.030)
pH: 5 (ref 5.0–8.0)

## 2024-01-16 LAB — BASIC METABOLIC PANEL WITH GFR
BUN: 46 mg/dL — ABNORMAL HIGH (ref 6–23)
CO2: 22 meq/L (ref 19–32)
Calcium: 9.6 mg/dL (ref 8.4–10.5)
Chloride: 107 meq/L (ref 96–112)
Creatinine, Ser: 2.24 mg/dL — ABNORMAL HIGH (ref 0.40–1.50)
GFR: 27.37 mL/min — ABNORMAL LOW (ref >60.00)
Glucose, Bld: 113 mg/dL — ABNORMAL HIGH (ref 70–99)
Potassium: 4.5 meq/L (ref 3.5–5.1)
Sodium: 140 meq/L (ref 135–145)

## 2024-01-16 LAB — HEPATIC FUNCTION PANEL
ALT: 14 U/L (ref 0–53)
AST: 21 U/L (ref 0–37)
Albumin: 4.4 g/dL (ref 3.5–5.2)
Alkaline Phosphatase: 61 U/L (ref 39–117)
Bilirubin, Direct: 0.3 mg/dL (ref 0.0–0.3)
Total Bilirubin: 0.9 mg/dL (ref 0.2–1.2)
Total Protein: 7 g/dL (ref 6.0–8.3)

## 2024-01-16 LAB — PROTIME-INR
INR: 1.2 ratio — ABNORMAL HIGH (ref 0.8–1.0)
Prothrombin Time: 12.9 s (ref 9.6–13.1)

## 2024-01-16 LAB — COMPREHENSIVE METABOLIC PANEL WITH GFR
ALT: 18 U/L (ref 0–44)
AST: 27 U/L (ref 15–41)
Albumin: 4 g/dL (ref 3.5–5.0)
Alkaline Phosphatase: 64 U/L (ref 38–126)
Anion gap: 13 (ref 5–15)
BUN: 49 mg/dL — ABNORMAL HIGH (ref 8–23)
CO2: 17 mmol/L — ABNORMAL LOW (ref 22–32)
Calcium: 9.1 mg/dL (ref 8.9–10.3)
Chloride: 105 mmol/L (ref 98–111)
Creatinine, Ser: 2.12 mg/dL — ABNORMAL HIGH (ref 0.61–1.24)
GFR, Estimated: 31 mL/min — ABNORMAL LOW (ref >60)
Glucose, Bld: 182 mg/dL — ABNORMAL HIGH (ref 70–99)
Potassium: 4.1 mmol/L (ref 3.5–5.1)
Sodium: 135 mmol/L (ref 135–145)
Total Bilirubin: 0.9 mg/dL (ref 0.0–1.2)
Total Protein: 6.9 g/dL (ref 6.5–8.1)

## 2024-01-16 LAB — IBC + FERRITIN
Ferritin: 147.8 ng/mL (ref 22.0–322.0)
Iron: 68 ug/dL (ref 42–165)
Saturation Ratios: 28.4 % (ref 20.0–50.0)
TIBC: 239.4 ug/dL — ABNORMAL LOW (ref 250.0–450.0)
Transferrin: 171 mg/dL — ABNORMAL LOW (ref 212.0–360.0)

## 2024-01-16 LAB — C-REACTIVE PROTEIN: CRP: <1 mg/dL (ref 0.5–20.0)

## 2024-01-16 MED ORDER — SODIUM CHLORIDE 0.9 % IV BOLUS
500.0000 mL | Freq: Once | INTRAVENOUS | Status: AC
  Administered 2024-01-16: 500 mL via INTRAVENOUS

## 2024-01-16 NOTE — ED Triage Notes (Signed)
 POV/ with wife/ sent by GI for abnormal kidney labs/ wife and pt unsure of levels/ pt c/o fatigue but no other complaints/ pt is ambulatory/ A&OX4

## 2024-01-16 NOTE — Discharge Instructions (Signed)
 Follow-up with your primary care provider in 48 hours for a recheck of your kidney function.  Continue to drink 1800 mL of water  per day, this is about 60 ounces.  If your symptoms worsen or if you have difficulty urinating or experience confusion please return to the emergency department.

## 2024-01-16 NOTE — Patient Instructions (Addendum)
 Your provider has requested that you go to the basement level for lab work before leaving today. Press B on the elevator. The lab is located at the first door on the left as you exit the elevator.  VISIT SUMMARY:  During today's visit, we discussed your low hemoglobin and thrombocytopenia, along with your history of atrial fibrillation and leg swelling. We reviewed your recent test results and symptoms, including occasional nausea and leg swelling. We also discussed your history of melanoma and your current medications.  YOUR PLAN:  -ATRIAL FIBRILLATION: Atrial fibrillation is an irregular and often rapid heart rate that can increase the risk of strokes, heart failure, and other heart-related complications. We will check your BNP levels to assess for fluid overload and obtain a chest x-ray to evaluate your lung status. Please ensure you follow up with your cardiologist for further management of your atrial fibrillation.  -PERIPHERAL EDEMA: Peripheral edema is swelling in the legs, often due to fluid buildup. We will obtain a chest x-ray to check for fluid in your lungs and check your BNP levels to assess for fluid overload.  -ANEMIA OF CHRONIC DISEASE: Anemia of chronic disease is a type of anemia that commonly occurs with chronic illnesses. We will check your iron and ferritin levels, order protein electrophoresis, and reticulocyte count to further evaluate your anemia. If you notice bright red blood in your stool or black stools, please visit the emergency room immediately.  -MELANOMA: Melanoma is a type of skin cancer that can spread to other parts of the body. There are no current concerns noted, but it is important to continue monitoring your skin for any changes.  INSTRUCTIONS:  Please ensure you follow up with your cardiologist for your atrial fibrillation. If you notice bright red blood in your stool or black stools, visit the emergency room immediately. Your next oncologist appointment  is in December, may need sooner pending labs.  Consider cardiopulmonary rehab.  Due to recent changes in healthcare laws, you may see the results of your imaging and laboratory studies on MyChart before your provider has had a chance to review them.  We understand that in some cases there may be results that are confusing or concerning to you. Not all laboratory results come back in the same time frame and the provider may be waiting for multiple results in order to interpret others.  Please give us  48 hours in order for your provider to thoroughly review all the results before contacting the office for clarification of your results.    I appreciate the  opportunity to care for you  Thank You   [facility]

## 2024-01-16 NOTE — ED Provider Notes (Signed)
 [place] EMERGENCY DEPARTMENT AT [facility] Provider Note   CSN: [id number] Arrival date & time: 01/16/24  1504     Patient presents with: Abnormal Lab   Nathan [name] is a 78 y.o. male.   78 year old male sent by another provider for AKI.  Patient was seen at his gastroenterologist office today and was found to have an AKI, with a creatinine of 2.24 today, most recent for comparison was 1.6.  Patient has history of HFrEF/CAD/A-fib/anemia, on aspirin .  Patient reports drinking about 32 ounces of water  a day, was previously drinking more than this but it sounds like he was told by another provider that he should reduce his fluid intake.  He denies nausea/vomiting, reports 1 episode of diarrhea in the last week but otherwise normal bowel movements for him, several days of oliguria which he associates with limited fluid intake.  Otherwise, he feels in his normal state of health.   Abnormal Lab      Prior to Admission medications   Medication Sig Start Date End Date Taking? Authorizing Provider  allopurinol  (ZYLOPRIM ) 300 MG tablet Take 300 mg by mouth daily.    [provider]  aspirin  EC 81 MG tablet Take 81 mg by mouth daily. Swallow whole. Patient not taking: Reported on 01/16/2024    [provider]  atorvastatin  (LIPITOR) 20 MG tablet Take 1 tablet (20 mg total) by mouth daily. 07/26/23   Samtani, Jai-Gurmukh, MD  Coenzyme Q10 (COQ10) 100 MG CAPS Take 100 mg by mouth in the morning and at bedtime.    [provider]  fish oil-omega-3 fatty acids 1000 MG capsule Take 1 g by mouth 2 (two) times daily.    [provider]  isosorbide  dinitrate (ISORDIL ) 30 MG tablet Take 30 mg by mouth daily.    [provider]  latanoprost  (XALATAN ) 0.005 % ophthalmic solution Place 1 drop into both eyes at bedtime.    [provider]  metFORMIN (GLUCOPHAGE) 1000 MG tablet Take 1,000 mg by mouth in the morning and at bedtime.    [provider]  metoprolol  succinate (TOPROL -XL) 100 MG 24 hr tablet Take 1 tablet (100 mg total) by mouth daily. Take with or immediately following a meal. 07/27/23   Samtani, Jai-Gurmukh, MD  minoxidil (LONITEN) 10 MG tablet Take 10 mg by mouth daily.    [provider]  nitroGLYCERIN  (NITROSTAT ) 0.4 MG SL tablet Place 1 tablet under the tongue as needed. 09/01/16   [provider]  pantoprazole  (PROTONIX ) 40 MG tablet Take 40 mg by mouth 2 (two) times daily. 01/08/24   [provider]    Allergies: Shellfish-derived products, Nsaids, Morphine , and Zolpidem tartrate    Review of Systems  Updated Vital Signs  Vitals:   01/16/24 1530 01/16/24 1829 01/16/24 2141 01/16/24 2145  BP:  125/67 138/63 134/72  Pulse:  61 63 60  Resp:  16 16   Temp:  (!) 97.5 F (36.4 C) 98.7 F (37.1 C)   TempSrc:  Oral Oral   SpO2:  98% 98% 99%  Weight: 64.4 kg        Physical Exam Vitals and nursing note reviewed.  HENT:     Head: Normocephalic.  Eyes:     Extraocular Movements: Extraocular movements intact.  Cardiovascular:     Rate and Rhythm: Normal rate and regular rhythm.     Heart sounds: Murmur heard.  Pulmonary:     Effort: Pulmonary effort is normal.  Breath sounds: Normal breath sounds.  Abdominal:     Palpations: Abdomen is soft.     Tenderness: There is no abdominal tenderness.  Musculoskeletal:     Cervical back: Normal range of motion.     Right lower leg: Edema (1+) present.     Left lower leg: Edema (1+) present.     Comments: Moves all extremities spontaneously without difficulty  Skin:    General: Skin is warm and dry.  Neurological:     Mental Status: He is alert and oriented to person, place, and time.     (all labs ordered are listed, but only abnormal results are displayed) Labs Reviewed  CBC WITH DIFFERENTIAL/PLATELET - Abnormal; Notable for the following components:      Result Value   RBC 2.86 (*)    Hemoglobin 8.4 (*)    HCT  25.9 (*)    RDW 17.6 (*)    All other components within normal limits  COMPREHENSIVE METABOLIC PANEL WITH GFR - Abnormal; Notable for the following components:   CO2 17 (*)    Glucose, Bld 182 (*)    BUN 49 (*)    Creatinine, Ser 2.12 (*)    GFR, Estimated 31 (*)    All other components within normal limits  URINALYSIS, ROUTINE W REFLEX MICROSCOPIC    EKG: None  Radiology: DG Chest 2 View Result Date: 01/16/2024 CLINICAL DATA:  Shortness of breath. Dry cough and dyspnea on exertion for 1 week. History of diabetes, hypertension, atrial fibrillation. EXAM: CHEST - 2 VIEW COMPARISON:  07/14/2023 FINDINGS: Postoperative changes in the mediastinum. Shallow inspiration. Cardiac enlargement. Mild interstitial pattern to the lungs is more prominent than prior study, possibly fibrosis or edema. No pleural effusion or pneumothorax. Mediastinal contours appear intact. Calcification of the aorta. Degenerative changes in the spine. IMPRESSION: Cardiac enlargement. Mild interstitial pattern to the lungs, more prominent than prior study. This may indicate fibrosis or edema. No focal consolidation. Electronically Signed   By: Elsie Gravely M.D.   On: 01/16/2024 21:28   CT Renal Stone Study Result Date: 01/16/2024 CLINICAL DATA:  New acute kidney injury. Abnormal renal labs. Fatigue. EXAM: CT ABDOMEN AND PELVIS WITHOUT CONTRAST TECHNIQUE: Multidetector CT imaging of the abdomen and pelvis was performed following the standard protocol without IV contrast. RADIATION DOSE REDUCTION: This exam was performed according to the departmental dose-optimization program which includes automated exposure control, adjustment of the mA and/or kV according to patient size and/or use of iterative reconstruction technique. COMPARISON:  CT abdomen and pelvis 07/14/2023. Radionuclide biliary scan 07/15/2023 FINDINGS: Lower chest: Small bilateral pleural effusions. Interstitial fibrosis in the lung bases. Cardiac enlargement.  Hepatobiliary: Cholelithiasis with multiple stones in the gallbladder. Gallbladder is contracted with evidence of pericholecystic edema. Similar findings were present on prior CT with radionuclide biliary scan findings suggesting chronic cholecystitis. No developing bile duct dilatation. Pancreas: Unremarkable. No pancreatic ductal dilatation or surrounding inflammatory changes. Spleen: Normal in size without focal abnormality. Adrenals/Urinary Tract: No adrenal gland nodules. Kidneys are symmetrical. Intrarenal calcifications bilaterally likely representing vascular calcifications. No definite stone identified. Left renal cyst measuring 4.5 cm diameter, unchanged. No imaging follow-up is indicated. No hydronephrosis or hydroureter. Bladder wall is mildly thickened, possibly due to under distention or cystitis. Correlate with urinalysis. Stomach/Bowel: Stomach, small bowel, and colon are not abnormally distended. Scattered stool throughout the colon. Colonic diverticula without evidence of acute diverticulitis. Wall thickening in the sigmoid region is likely muscular hypertrophy. Appendix is not identified. Vascular/Lymphatic: Aortic atherosclerosis. No  enlarged abdominal or pelvic lymph nodes. Reproductive: Prostate is unremarkable. Other: Small amount of free fluid in the pelvis is nonspecific but likely reactive. No free air. Edema in the abdominal wall fat. Small right inguinal hernia containing fat and fluid. Musculoskeletal: Spondylolysis with mild spondylolisthesis at L5-S1. Degenerative changes in the spine. No acute bony abnormalities. IMPRESSION: 1. No renal or ureteral stone or obstruction. Intrarenal calcifications bilaterally are likely vascular. 2. Mild bladder wall thickening may indicate under distention or cystitis. 3. Cholelithiasis with gallbladder wall thickening and edema. Previous studies have suggested this to represent chronic cholecystitis. Based on CT appearance, acute cholecystitis is not  excluded. 4. Small bilateral pleural effusions. Interstitial fibrosis in the lung bases. 5. Aortic atherosclerosis. 6. Colonic diverticulosis without evidence of acute diverticulitis. 7. Small right inguinal hernia containing fat. 8. Small amount of free fluid in the pelvis is nonspecific but probably reactive. Electronically Signed   By: Elsie Gravely M.D.   On: 01/16/2024 21:27     Procedures   Medications Ordered in the ED  sodium chloride  0.9 % bolus 500 mL (0 mLs Intravenous Stopped 01/16/24 2212)                                    Medical Decision Making This patient presents to the ED for concern of acute kidney injury, this involves an extensive number of treatment options, and is a complaint that carries with it a high risk of complications and morbidity.  The differential diagnosis includes dehydration, other hypovolemia, postrenal causes including BPH/nephrolithiasis/ureterolithiasis/other obstructive process.   Co morbidities that complicate the patient evaluation  CHF, diabetes, CAD, history of A-fib   Additional history obtained:  Additional history obtained from record review External records from outside source obtained and reviewed including GI note from today, most recent cardiology note   Lab Tests:  I Ordered, and personally interpreted labs.  The pertinent results include: CBC notable for hemoglobin of 8.4, however this is consistent with his baseline.  CMP notable for creatinine of 2.12 with earlier reading today of 2.24, his most recent baseline 5 months ago was 1.36.  Urinalysis within normal limits.   Imaging Studies ordered:  I ordered imaging studies including CT renal stone study I independently visualized and interpreted imaging which showed 1. No renal or ureteral stone or obstruction. Intrarenal calcifications bilaterally are likely vascular. 2. Mild bladder wall thickening may indicate under distention or cystitis. 3. Cholelithiasis with  gallbladder wall thickening and edema. Previous studies have suggested this to represent chronic cholecystitis. Based on CT appearance, acute cholecystitis is not excluded. 4. Small bilateral pleural effusions. Interstitial fibrosis in the lung bases. 5. Aortic atherosclerosis. 6. Colonic diverticulosis without evidence of acute diverticulitis. 7. Small right inguinal hernia containing fat. 8. Small amount of free fluid in the pelvis is nonspecific but probably reactive.  I agree with the radiologist interpretation   Cardiac Monitoring: / EKG:  The patient was maintained on a cardiac monitor.  I personally viewed and interpreted the cardiac monitored which showed an underlying rhythm of: NSR   Consultations Obtained:  I requested consultation with the hospitalist,  and discussed lab and imaging findings as well as pertinent plan - they recommend: Spoke with Dr. Jackee, she reviewed the patient's labs/imaging and does not feel that he is appropriate for admission.  She recommends increase fluid intake of 1800 mL/day, close PCP follow-up within 48 hours for recheck of  BMP.   Problem List / ED Course / Critical interventions / Medication management  500 cc fluid bolus given for AKI I have reviewed the patients home medicines and have made adjustments as needed   Test / Admission - Considered:  Physical exam notable as above.  Patient's labs are largely reassuring aside from isolated acute kidney injury and anemia, which is consistent with his baseline.  Patient does demonstrate mild lower extremity pitting edema, however I do not hear crackles on auscultation of his lungs, he does not appear grossly fluid overloaded.  Chest x-ray was completed today by patient's primary care provider, see above.  I discussed his case with the hospitalist as above, see their recommendation.  I discussed these findings in depth with patient and his wife, I have recommended increased fluid intake of 1800 mL/day  (approximately 60 ounces), this is below his cardiologist limit of 2 L fluid restriction.  Patient with history of CHF, most recent echo was notable for left ventricular ejection fraction of 35 to 40%.  Patient is well-appearing with stable vital signs, I do feel that he is appropriate for discharge with close PCP follow-up for recheck of his BMP.  Patient is in agreement with this plan, strict return precautions discussed.  He is appropriate discharge at this time.    Amount and/or Complexity of Data Reviewed Radiology: ordered.        Final diagnoses:  Acute kidney injury [facility])    ED Discharge Orders     None          Glendia Rocky SAILOR, [place] 01/16/24 2342    Bari Roxie HERO, DO 01/18/24 1543

## 2024-01-17 ENCOUNTER — Other Ambulatory Visit (HOSPITAL_COMMUNITY): Payer: Self-pay | Admitting: *Deleted

## 2024-01-17 DIAGNOSIS — N179 Acute kidney failure, unspecified: Secondary | ICD-10-CM

## 2024-01-18 ENCOUNTER — Ambulatory Visit (HOSPITAL_COMMUNITY)
Admission: RE | Admit: 2024-01-18 | Discharge: 2024-01-18 | Disposition: A | Discharge status: Home / Self Care | Source: Ambulatory Visit | Attending: Internal Medicine | Admitting: Internal Medicine

## 2024-01-18 DIAGNOSIS — D649 Anemia, unspecified: Secondary | ICD-10-CM | POA: Diagnosis not present

## 2024-01-18 DIAGNOSIS — N179 Acute kidney failure, unspecified: Secondary | ICD-10-CM

## 2024-01-18 LAB — PREPARE RBC (CROSSMATCH)

## 2024-01-18 MED ORDER — SODIUM CHLORIDE 0.9% IV SOLUTION: Freq: Once | INTRAVENOUS | Status: DC

## 2024-01-18 MED ORDER — FUROSEMIDE 10 MG/ML IJ SOLN
40.0000 mg | Freq: Once | INTRAMUSCULAR | Status: AC
Start: 2024-01-18 — End: 2024-01-18
  Administered 2024-01-18: 40 mg via INTRAVENOUS

## 2024-01-18 MED ORDER — FUROSEMIDE 10 MG/ML IJ SOLN
INTRAMUSCULAR | Status: AC
  Filled 2024-01-18: qty 4

## 2024-01-19 ENCOUNTER — Telehealth (HOSPITAL_BASED_OUTPATIENT_CLINIC_OR_DEPARTMENT_OTHER): Payer: Self-pay | Admitting: Family

## 2024-01-19 ENCOUNTER — Other Ambulatory Visit (HOSPITAL_BASED_OUTPATIENT_CLINIC_OR_DEPARTMENT_OTHER): Payer: Self-pay | Admitting: *Deleted

## 2024-01-19 DIAGNOSIS — Z952 Presence of prosthetic heart valve: Secondary | ICD-10-CM

## 2024-01-19 DIAGNOSIS — E785 Hyperlipidemia, unspecified: Secondary | ICD-10-CM

## 2024-01-19 DIAGNOSIS — I480 Paroxysmal atrial fibrillation: Secondary | ICD-10-CM

## 2024-01-19 DIAGNOSIS — I5020 Unspecified systolic (congestive) heart failure: Secondary | ICD-10-CM

## 2024-01-19 DIAGNOSIS — I25118 Atherosclerotic heart disease of native coronary artery with other forms of angina pectoris: Secondary | ICD-10-CM

## 2024-01-19 DIAGNOSIS — I5022 Chronic systolic (congestive) heart failure: Secondary | ICD-10-CM

## 2024-01-19 LAB — RETICULOCYTES
ABS Retic: 67920 {cells}/uL (ref 25000–90000)
Retic Ct Pct: 2.4 %

## 2024-01-19 LAB — PROTEIN ELECTROPHORESIS, SERUM
Albumin ELP: 4 g/dL (ref 3.8–4.8)
Alpha 1: 0.4 g/dL — ABNORMAL HIGH (ref 0.2–0.3)
Alpha 2: 0.9 g/dL (ref 0.5–0.9)
Beta 2: 0.4 g/dL (ref 0.2–0.5)
Beta Globulin: 0.4 g/dL (ref 0.4–0.6)
Gamma Globulin: 0.7 g/dL — ABNORMAL LOW (ref 0.8–1.7)
Total Protein: 6.7 g/dL (ref 6.1–8.1)

## 2024-01-19 LAB — TYPE AND SCREEN
ABO/RH(D): A POS
Antibody Screen: NEGATIVE
Unit division: 0

## 2024-01-19 LAB — BPAM RBC
Blood Product Expiration Date: 202508032359
ISSUE DATE / TIME: 202507100959
Unit Type and Rh: 6200

## 2024-01-19 MED ORDER — FUROSEMIDE 20 MG PO TABS: 20.0000 mg | ORAL_TABLET | Freq: Every day | ORAL | 11 refills | Status: AC

## 2024-01-19 MED ORDER — ASPIRIN 81 MG PO TBEC: 81.0000 mg | DELAYED_RELEASE_TABLET | Freq: Every day | ORAL | Status: AC

## 2024-01-19 NOTE — Telephone Encounter (Signed)
 Note received from GI team Patient seen in office yesterday for anemia which appears to be more anemia of chronic disease/inflammation, he appeared fluid overloaded at the time.CXR shows increased intersitial markings, he was sent to the ER for AKI 1.4 baseline to 2.2 and in the setting of possible CHF.CT renal in the ER showed bilateral pleural effusions. The ER suggested increasing water  intake. I will inform the patient to call your office to make a follow up appointment.  Per Dr. Jeffrie, with increased volume, recommend Lasix  20mg  once a day. Known CKD/AKI, challenging situation. Echo 04/2024 LVEF 35-40%.  After visit 12/29/23 med list reviewed. There is discrepancy between our med list, PCP med list, pharmacy fill list - see below, those is red have discrepancy. During admission 07/2023 recommended to hold Spironolactone , hydrochlorothiazide  due to AKI.   Our list PCP Pharmacy fill Note  ASA 81mg  daily ASA 81mg  daily ---   Atorvastatin  20mg  daily Atorvastatin  20mg  daily  Atorvastatin  20mg  (not on file) No fill on file  Fish oil 1000mg   BID --- ---   --- Hydrochlorothiazide  25mg  daily  Hydrochlorothiazide  12.5mg  (10/01/23 90 days) Out of supply per pharmacy  Isordil  30mg  daily Imdur  30mg  daily Imdur  30mg  daily (04/16/23 90 days) Out of supply per pharmacy  Metoprolol  succinate 100mg  daily Metoprolol  succiante 100mg  daily Metoprolol  100mg  (11/13/23 90 days)   --- --- Metoprolol  tartrate 50mg  BID 10/20/22 (90 days) Duplicate beta blocker therapy  Minoxidil 10mg  daily Minoxidil 10mg  daily Minoxidil 10mg  (09/28/23 90 days) Out of supply per pharmacy  --- Spironolactone  25mg  take half tablet daily Spironolactone  25mg  take half tablet daily (08/15/23 90 days) Out of supply per pharmacy   Will ask nursing team to call: Please Rx Lasix  20mg  daily with BMET/BNP in 7-10 days Schedule follow up with Dr. Jeffrie or APP at [facility] office in 2-3 weeks. Bring pill bottles to this appointment Ensure he is NOT  taking Metoprolol  Tartrate, Spironolactone , hydrochlorothiazide .  Ensure he IS taking Aspirin  81mg  daily, Atorvastatin  20mg  daily, Metoprolol  Succinate 100mg  daily, Minoxidil 10mg  daily. Provide refills if needed. Please inquire if taking Isosorbide  Mononitrate: If taking, update med list. If not taking, remain off. Recommend drinking 64 oz (2L) fluid per day but not more than this.  Provider note only: At follow up consider reducing Minoxidil dose as may contribute to fluid retention  Reche GORMAN Finder, NP

## 2024-01-19 NOTE — Telephone Encounter (Signed)
 Addressed via 01/19/24 encounter.   Nathan Espericueta S Ashea Winiarski, NP

## 2024-01-19 NOTE — Telephone Encounter (Signed)
 S/w pt went over all instructions per paperwork extensively.Updated medication list, ordered labs and released, scheduled appointment. Pt did not need refills. Will bring all med bottles to upcoming appointment and will watch fluid intake. Will send this to pt in mychart.

## 2024-01-22 DIAGNOSIS — R9431 Abnormal electrocardiogram [ECG] [EKG]: Secondary | ICD-10-CM | POA: Diagnosis not present

## 2024-01-22 DIAGNOSIS — I2510 Atherosclerotic heart disease of native coronary artery without angina pectoris: Secondary | ICD-10-CM | POA: Diagnosis not present

## 2024-01-22 DIAGNOSIS — R195 Other fecal abnormalities: Secondary | ICD-10-CM | POA: Diagnosis not present

## 2024-01-22 DIAGNOSIS — D510 Vitamin B12 deficiency anemia due to intrinsic factor deficiency: Secondary | ICD-10-CM | POA: Diagnosis not present

## 2024-01-22 DIAGNOSIS — N179 Acute kidney failure, unspecified: Secondary | ICD-10-CM | POA: Diagnosis not present

## 2024-01-22 DIAGNOSIS — I480 Paroxysmal atrial fibrillation: Secondary | ICD-10-CM | POA: Diagnosis not present

## 2024-01-22 DIAGNOSIS — R001 Bradycardia, unspecified: Secondary | ICD-10-CM | POA: Diagnosis not present

## 2024-01-22 DIAGNOSIS — D649 Anemia, unspecified: Secondary | ICD-10-CM | POA: Diagnosis not present

## 2024-01-22 DIAGNOSIS — I5021 Acute systolic (congestive) heart failure: Secondary | ICD-10-CM | POA: Diagnosis not present

## 2024-01-22 DIAGNOSIS — E1151 Type 2 diabetes mellitus with diabetic peripheral angiopathy without gangrene: Secondary | ICD-10-CM | POA: Diagnosis not present

## 2024-01-22 NOTE — Telephone Encounter (Signed)
 Inbound call from [facility] wanting to inform that patient stated he had a significant amount of blood in stool on Friday. Has not had any since then. Also wanted to confirm [facility] referred patient to hematology. Please advise, thank you.

## 2024-01-25 ENCOUNTER — Inpatient Hospital Stay: Attending: Oncology | Admitting: Oncology

## 2024-01-25 ENCOUNTER — Inpatient Hospital Stay

## 2024-01-25 ENCOUNTER — Encounter: Payer: Self-pay | Admitting: Oncology

## 2024-01-25 VITALS — BP 115/53 | HR 69 | Temp 98.3°F | Resp 18 | Ht 62.0 in | Wt 138.2 lb

## 2024-01-25 DIAGNOSIS — Z800 Family history of malignant neoplasm of digestive organs: Secondary | ICD-10-CM | POA: Diagnosis not present

## 2024-01-25 DIAGNOSIS — D649 Anemia, unspecified: Secondary | ICD-10-CM | POA: Diagnosis not present

## 2024-01-25 DIAGNOSIS — K5730 Diverticulosis of large intestine without perforation or abscess without bleeding: Secondary | ICD-10-CM

## 2024-01-25 DIAGNOSIS — C434 Malignant melanoma of scalp and neck: Secondary | ICD-10-CM

## 2024-01-25 LAB — CBC WITH DIFFERENTIAL (CANCER CENTER ONLY)
Abs Immature Granulocytes: 0.06 K/uL (ref 0.00–0.07)
Basophils Absolute: 0.1 K/uL (ref 0.0–0.1)
Basophils Relative: 1 %
Eosinophils Absolute: 0.1 K/uL (ref 0.0–0.5)
Eosinophils Relative: 2 %
HCT: 26.7 % — ABNORMAL LOW (ref 39.0–52.0)
Hemoglobin: 9 g/dL — ABNORMAL LOW (ref 13.0–17.0)
Immature Granulocytes: 1 %
Lymphocytes Relative: 13 %
Lymphs Abs: 0.8 K/uL (ref 0.7–4.0)
MCH: 29.9 pg (ref 26.0–34.0)
MCHC: 33.7 g/dL (ref 30.0–36.0)
MCV: 88.7 fL (ref 80.0–100.0)
Monocytes Absolute: 0.6 K/uL (ref 0.1–1.0)
Monocytes Relative: 9 %
Neutro Abs: 4.8 K/uL (ref 1.7–7.7)
Neutrophils Relative %: 74 %
Platelet Count: 162 K/uL (ref 150–400)
RBC: 3.01 MIL/uL — ABNORMAL LOW (ref 4.22–5.81)
RDW: 17.3 % — ABNORMAL HIGH (ref 11.5–15.5)
WBC Count: 6.4 K/uL (ref 4.0–10.5)
nRBC: 0 % (ref 0.0–0.2)

## 2024-01-25 LAB — RETICULOCYTES
Immature Retic Fract: 15.7 % (ref 2.3–15.9)
RBC.: 3 MIL/uL — ABNORMAL LOW (ref 4.22–5.81)
Retic Count, Absolute: 63.9 K/uL (ref 19.0–186.0)
Retic Ct Pct: 2.1 % (ref 0.4–3.1)

## 2024-01-25 LAB — IRON AND IRON BINDING CAPACITY (CC-WL,HP ONLY)
Iron: 48 ug/dL (ref 45–182)
Saturation Ratios: 21 % (ref 17.9–39.5)
TIBC: 232 ug/dL — ABNORMAL LOW (ref 250–450)
UIBC: 184 ug/dL (ref 117–376)

## 2024-01-25 LAB — FOLATE: Folate: 11.5 ng/mL (ref >5.9)

## 2024-01-25 LAB — DIRECT ANTIGLOBULIN TEST (NOT AT ARMC)
DAT, IgG: NEGATIVE
DAT, complement: NEGATIVE

## 2024-01-25 LAB — VITAMIN B12: Vitamin B-12: 5321 pg/mL — ABNORMAL HIGH (ref 180–914)

## 2024-01-25 LAB — CMP (CANCER CENTER ONLY)
ALT: 12 U/L (ref 0–44)
AST: 18 U/L (ref 15–41)
Albumin: 4 g/dL (ref 3.5–5.0)
Alkaline Phosphatase: 70 U/L (ref 38–126)
Anion gap: 9 (ref 5–15)
BUN: 42 mg/dL — ABNORMAL HIGH (ref 8–23)
CO2: 23 mmol/L (ref 22–32)
Calcium: 9.3 mg/dL (ref 8.9–10.3)
Chloride: 106 mmol/L (ref 98–111)
Creatinine: 2.2 mg/dL — ABNORMAL HIGH (ref 0.61–1.24)
GFR, Estimated: 30 mL/min — ABNORMAL LOW (ref >60)
Glucose, Bld: 96 mg/dL (ref 70–99)
Potassium: 4.3 mmol/L (ref 3.5–5.1)
Sodium: 138 mmol/L (ref 135–145)
Total Bilirubin: 1.3 mg/dL — ABNORMAL HIGH (ref 0.0–1.2)
Total Protein: 6.7 g/dL (ref 6.5–8.1)

## 2024-01-25 LAB — FERRITIN: Ferritin: 318 ng/mL (ref 24–336)

## 2024-01-25 LAB — TSH: TSH: 1.74 u[IU]/mL (ref 0.350–4.500)

## 2024-01-25 LAB — LACTATE DEHYDROGENASE: LDH: 195 U/L — ABNORMAL HIGH (ref 98–192)

## 2024-01-25 NOTE — Assessment & Plan Note (Addendum)
 Anemia likely due to chronic blood loss from the gastrointestinal tract, possibly related to diverticulosis. Other potential causes include iron deficiency, vitamin B12 deficiency, folic acid deficiency, or autoimmune hemolysis. Recent blood transfusion indicates significant anemia. Symptoms include shortness of breath and fatigue.  Labs today showed stable hemoglobin of 9.  White count and platelet count are within normal limits.  Creatinine 2.2, normal LFTs.  Iron studies showed no evidence of iron deficiency.  No evidence of B12, folate deficiency.  TSH, LDH are within normal limits.  Coombs test negative.  Haptoglobin pending.  Will also check SPEP, IFE, quantitative globulins, serum free light chains to rule out any underlying monoclonal gammopathy.  Will follow-up on these results.  - Consider oral iron supplementation if tolerated, noting potential gastrointestinal side effects such as constipation, nausea, and heartburn  - Monitor hemoglobin levels and transfuse if hemoglobin drops below 8 g/dL  RTC in 4 to 5 weeks for follow-up, sooner if symptomatic.

## 2024-01-25 NOTE — Progress Notes (Signed)
 [facility]  HEMATOLOGY CLINIC CONSULTATION NOTE   PATIENT NAME: Nathan [name]   MR#: [id number] DOB: May 02, 1946  DATE OF SERVICE: 01/25/2024  Patient Care Team: Yolande Toribio MATSU, MD as PCP - General (Internal Medicine) Jeffrie Oneil BROCKS, MD as PCP - Cardiology (Cardiology) Rubin Calamity, MD as Consulting Physician (General Surgery) Pyrtle, Gordy HERO, MD as Consulting Physician (Gastroenterology) Dohmeier, Dedra, MD as Consulting Physician (Neurology)  REASON FOR CONSULTATION/ CHIEF COMPLAINT:  Evaluation of anemia.  ASSESSMENT & PLAN:   Atilla Zollner is a 78 y.o. gentleman with a past medical history of stage II A melanoma on the scalp, status post wide excision on 08/17/2023, hypertension, diabetes mellitus, CAD status post CABG x 3, congestive heart failure, paroxysmal A-fib, severe diverticulosis, history of gastric ulcer, aortic stenosis status post aortic valve replacement in April 2010, peripheral vascular disease, glaucoma, dyslipidemia, B12 deficiency,OSA not on CPAP, was referred to our service for evaluation of normocytic anemia.    Normocytic anemia Anemia likely due to chronic blood loss from the gastrointestinal tract, possibly related to diverticulosis. Other potential causes include iron deficiency, vitamin B12 deficiency, folic acid deficiency, or autoimmune hemolysis. Recent blood transfusion indicates significant anemia. Symptoms include shortness of breath and fatigue.  Labs today showed stable hemoglobin of 9.  White count and platelet count are within normal limits.  Creatinine 2.2, normal LFTs.  Iron studies showed no evidence of iron deficiency.  No evidence of B12, folate deficiency.  TSH, LDH are within normal limits.  Coombs test negative.  Haptoglobin pending.  Will also check SPEP, IFE, quantitative globulins, serum free light chains to rule out any underlying monoclonal gammopathy.  Will follow-up on these results.  - Consider oral iron  supplementation if tolerated, noting potential gastrointestinal side effects such as constipation, nausea, and heartburn  - Monitor hemoglobin levels and transfuse if hemoglobin drops below 8 g/dL  RTC in 4 to 5 weeks for follow-up, sooner if symptomatic.  Diverticulosis of colon Has history of severe diverticulosis which may contribute to chronic blood loss and anemia. - Recommend high-fiber diet to manage diverticulosis  Melanoma of scalp (HCC) Stage II melanoma of the scalp status post wide excision in February 2025.  Has follow-up with surgical oncology department.  He did not receive adjuvant treatments.     I reviewed lab results and outside records for this visit and discussed relevant results with the patient. Diagnosis, plan of care and treatment options were also discussed in detail with the patient. Opportunity provided to ask questions and answers provided to his apparent satisfaction. Provided instructions to call our clinic with any problems, questions or concerns prior to return visit. I recommended to continue follow-up with PCP and sub-specialists. He verbalized understanding and agreed with the plan. No barriers to learning was detected.  Damian Buckles, MD [facility] - A DEPT OF [facility] [address] [place] [address] Dept: [phone] Dept Fax: [phone]  01/25/2024 7:18 PM  HISTORY OF PRESENT ILLNESS:  Discussed the use of AI scribe software for clinical note transcription with the patient, who gave verbal consent to proceed.  History of Present Illness Nathan [name] is a 78 year old male with anemia who presents for evaluation of anemia and recent blood transfusion.   On 12/27/2023, labs at his PCPs office showed hemoglobin of 9.4, trended down to 9 on 01/01/2024, 8.4 on 01/08/2024 and 7.8 on 01/15/2024.  He received 1  unit of PRBC transfusion on 01/18/2024.  He was seen by GI on 01/16/2024  and sent to ED for AKI with creatinine of 2.2.  He had 1+ Hemoccult.  However not a candidate for EGD and colonoscopy because of comorbidities.  Referral was sent to us  for further evaluation and management of anemia.  He recently underwent a blood transfusion and was sent home with a kit to check for blood in his stool, which revealed a small red spot in one sample, prompting further investigation. He has not noticed any overt bleeding himself, except for a single instance of noticing something unusual the day after the transfusion, which has not recurred. No epistaxis, gum bleeding, or other bleeding problems.  He experiences shortness of breath and fatigue upon exertion, ongoing for about a year. He reports that he was very sick last year, had long hospital stays and rehabilitation, and has been persistently weak since then. He describes needing to sit down after exertion due to feeling 'real tired real quick.'  His past medical history includes diverticulosis. He denies cravings for unusual foods like ice chips but mentions a craving for [employer] drinks. He is currently receiving B12 injections monthly but does not recall taking iron supplements in the past.  In terms of his social history, he mistakenly took fish oil instead of vitamin D3, which he thought he was taking.    MEDICAL HISTORY:  Past Medical History:  Diagnosis Date   Allergy    Aortic stenosis    Arthritis    Balance problem 04/26/2022   Blood transfusion without reported diagnosis    CAD (coronary artery disease)    Cataract    both eyes surgically removed   Diabetes mellitus, type 2 (HCC)    Diverticulosis of colon (without mention of hemorrhage)    Duodenal ulcer perforation (HCC)    blood transfusion   Flesh-eating bacteria (HCC) 1995   GERD (gastroesophageal reflux disease)    Glaucoma    pre glaucoma on Xalatan  drops   Heart murmur    History of diabetic retinopathy    History of gastrointestinal  bleeding    History of necrotizing fasciitis    History of prosthetic aortic valve    Hypercholesteremia    Myocardial infarction (HCC) 02/23/2016   Neuropathic pain 04/26/2022   Nightmares    CHRONIC   OSA (obstructive sleep apnea)    borderline no CPAP    Personal history of colonic polyps 11/23/2009   TUBULAR ADENOMA   Postoperative anemia    Sinus bradycardia    Sleep apnea    no cpap   Stomach ulcer    Hx of   Systolic hypertension    Vitamin B12 deficiency     SURGICAL HISTORY: Past Surgical History:  Procedure Laterality Date   AORTIC VALVE REPLACEMENT     BLEPHAROPLASTY  11/24/2014   CARDIAC CATHETERIZATION N/A 03/01/2016   Procedure: Coronary/Graft Angiography;  Surgeon: Peter M Swaziland, MD;  Location: [facility];  Service: Cardiovascular;  Laterality: N/A;   CATARACT EXTRACTION W/ INTRAOCULAR LENS IMPLANT     OD   COLONOSCOPY     CORONARY ARTERY BYPASS GRAFT  2010   x 4   flesh eating disease surgery      surgeries x 7    HERNIA REPAIR  1972   IR EXCHANGE BILIARY DRAIN  03/07/2023   IR EXCHANGE BILIARY DRAIN  04/18/2023   IR PERC CHOLECYSTOSTOMY  03/02/2023   POLYPECTOMY  RETINAL LASER PROCEDURE     SCROTAL SURGERY  1995   resection   UMBILICAL HERNIA REPAIR N/A 04/10/2013   open UHR w Ventralex mesh patch - Dr Gladis    SOCIAL HISTORY: He reports that he has never smoked. He has never used smokeless tobacco. He reports that he does not drink alcohol  and does not use drugs. Social History   Socioeconomic History   Marital status: Married    Spouse name: Not on file   Number of children: 0   Years of education: Not on file   Highest education level: Not on file  Occupational History   Occupation: Investment banker, corporate: [employer]   Occupation: Retired  Tobacco Use   Smoking status: Never   Smokeless tobacco: Never  Vaping Use   Vaping status: Never Used  Substance and Sexual Activity   Alcohol  use: No    Alcohol /week: 0.0  standard drinks of alcohol    Drug use: No   Sexual activity: Not on file  Other Topics Concern   Not on file  Social History Narrative   Not on file   Social Drivers of Health   Financial Resource Strain: Not on file  Food Insecurity: No Food Insecurity (07/21/2023)   Hunger Vital Sign    Worried About Running Out of Food in the Last Year: Never true    Ran Out of Food in the Last Year: Never true  Transportation Needs: No Transportation Needs (07/21/2023)   PRAPARE - Administrator, Civil Service (Medical): No    Lack of Transportation (Non-Medical): No  Physical Activity: Not on file  Stress: Not on file  Social Connections: Moderately Isolated (07/15/2023)   Social Connection and Isolation Panel    Frequency of Communication with Friends and Family: Never    Frequency of Social Gatherings with Friends and Family: Twice a week    Attends Religious Services: More than 4 times per year    Active Member of [employer] or Organizations: No    Attends Banker Meetings: Never    Marital Status: Married  Catering manager Violence: Not At Risk (07/21/2023)   Humiliation, Afraid, Rape, and Kick questionnaire    Fear of Current or Ex-Partner: No    Emotionally Abused: No    Physically Abused: No    Sexually Abused: No    FAMILY HISTORY: Family History  Problem Relation Age of Onset   Cirrhosis Father    Alcohol  abuse Father        father murdered   Rectal cancer Mother    Diabetes Mother    Colon cancer Mother 66       rectal cancer   Hypertension Mother    Hypertension Sister    Diabetes Sister    Heart attack Neg Hx    Stroke Neg Hx    Colon polyps Neg Hx    Esophageal cancer Neg Hx    Stomach cancer Neg Hx     ALLERGIES:  He is allergic to shellfish-derived products, nsaids, morphine , and zolpidem tartrate.  MEDICATIONS:  Current Outpatient Medications  Medication Sig Dispense Refill   allopurinol  (ZYLOPRIM ) 300 MG tablet Take 300 mg by mouth  daily.     aspirin  EC 81 MG tablet Take 1 tablet (81 mg total) by mouth daily. Swallow whole.     atorvastatin  (LIPITOR) 20 MG tablet Take 1 tablet (20 mg total) by mouth daily.     Coenzyme Q10 (COQ10) 100 MG CAPS  Take 100 mg by mouth in the morning and at bedtime.     cyanocobalamin  (VITAMIN B12) 1000 MCG/ML injection Inject 1 mL into the muscle every 30 (thirty) days.     fish oil-omega-3 fatty acids 1000 MG capsule Take 1 g by mouth 2 (two) times daily.     furosemide  (LASIX ) 20 MG tablet Take 1 tablet (20 mg total) by mouth daily. 30 tablet 11   latanoprost  (XALATAN ) 0.005 % ophthalmic solution Place 1 drop into both eyes at bedtime.     metFORMIN (GLUCOPHAGE) 1000 MG tablet Take 1,000 mg by mouth in the morning and at bedtime.     metoprolol  succinate (TOPROL -XL) 100 MG 24 hr tablet Take 1 tablet (100 mg total) by mouth daily. Take with or immediately following a meal.     minoxidil (LONITEN) 10 MG tablet Take 10 mg by mouth daily.     nitroGLYCERIN  (NITROSTAT ) 0.4 MG SL tablet Place 1 tablet under the tongue as needed.  12   pantoprazole  (PROTONIX ) 40 MG tablet Take 40 mg by mouth 2 (two) times daily.     Multiple Vitamin (MULTI-VITAMIN) tablet Take 1 tablet by mouth daily. (Patient not taking: Reported on 01/25/2024)     No current facility-administered medications for this visit.    REVIEW OF SYSTEMS:    Review of Systems - Oncology  All other pertinent systems were reviewed and were negative except as mentioned above.  PHYSICAL EXAMINATION:    Onc Performance Status - 01/25/24 0937       ECOG Perf Status   ECOG Perf Status Ambulatory and capable of all selfcare but unable to carry out any work activities.  Up and about more than 50% of waking hours      KPS SCALE   KPS % SCORE Normal activity with effort, some s/s of disease          Vitals:   01/25/24 0929  BP: (!) 115/53  Pulse: 69  Resp: 18  Temp: 98.3 F (36.8 C)  SpO2: 99%   Filed Weights   01/25/24  0929  Weight: 138 lb 3.2 oz (62.7 kg)    Physical Exam Constitutional:      General: He is not in acute distress.    Appearance: Normal appearance.  HENT:     Head: Normocephalic and atraumatic.  Eyes:     Conjunctiva/sclera: Conjunctivae normal.  Cardiovascular:     Rate and Rhythm: Normal rate and regular rhythm.     Heart sounds: Murmur heard.  Pulmonary:     Effort: Pulmonary effort is normal. No respiratory distress.  Abdominal:     General: There is no distension.  Skin:    Comments: Bandage on the scalp, where he underwent excision of the melanoma  Neurological:     General: No focal deficit present.     Mental Status: He is alert and oriented to person, place, and time.  Psychiatric:        Mood and Affect: Mood normal.        Behavior: Behavior normal.      LABORATORY DATA:   I have reviewed the data as listed.  Results for orders placed or performed in visit on 01/25/24  Reticulocytes  Result Value Ref Range   Retic Ct Pct 2.1 0.4 - 3.1 %   RBC. 3.00 (L) 4.22 - 5.81 MIL/uL   Retic Count, Absolute 63.9 19.0 - 186.0 K/uL   Immature Retic Fract 15.7 2.3 - 15.9 %  TSH  Result Value Ref Range   TSH 1.740 0.350 - 4.500 uIU/mL  Folate  Result Value Ref Range   Folate 11.5 >5.9 ng/mL  Vitamin B12  Result Value Ref Range   Vitamin B-12 5,321 (H) 180 - 914 pg/mL  Ferritin  Result Value Ref Range   Ferritin 318 24 - 336 ng/mL  Iron and Iron Binding Capacity ([facility] only)  Result Value Ref Range   Iron 48 45 - 182 ug/dL   TIBC 767 (L) 749 - 549 ug/dL   Saturation Ratios 21 17.9 - 39.5 %   UIBC 184 117 - 376 ug/dL  Lactate dehydrogenase  Result Value Ref Range   LDH 195 (H) 98 - 192 U/L  CMP (Cancer Center only)  Result Value Ref Range   Sodium 138 135 - 145 mmol/L   Potassium 4.3 3.5 - 5.1 mmol/L   Chloride 106 98 - 111 mmol/L   CO2 23 22 - 32 mmol/L   Glucose, Bld 96 70 - 99 mg/dL   BUN 42 (H) 8 - 23 mg/dL   Creatinine 7.79 (H) 9.38 - 1.24 mg/dL    Calcium  9.3 8.9 - 10.3 mg/dL   Total Protein 6.7 6.5 - 8.1 g/dL   Albumin 4.0 3.5 - 5.0 g/dL   AST 18 15 - 41 U/L   ALT 12 0 - 44 U/L   Alkaline Phosphatase 70 38 - 126 U/L   Total Bilirubin 1.3 (H) 0.0 - 1.2 mg/dL   GFR, Estimated 30 (L) >60 mL/min   Anion gap 9 5 - 15  CBC with Differential (Cancer Center Only)  Result Value Ref Range   WBC Count 6.4 4.0 - 10.5 K/uL   RBC 3.01 (L) 4.22 - 5.81 MIL/uL   Hemoglobin 9.0 (L) 13.0 - 17.0 g/dL   HCT 73.2 (L) 60.9 - 47.9 %   MCV 88.7 80.0 - 100.0 fL   MCH 29.9 26.0 - 34.0 pg   MCHC 33.7 30.0 - 36.0 g/dL   RDW 82.6 (H) 88.4 - 84.4 %   Platelet Count 162 150 - 400 K/uL   nRBC 0.0 0.0 - 0.2 %   Neutrophils Relative % 74 %   Neutro Abs 4.8 1.7 - 7.7 K/uL   Lymphocytes Relative 13 %   Lymphs Abs 0.8 0.7 - 4.0 K/uL   Monocytes Relative 9 %   Monocytes Absolute 0.6 0.1 - 1.0 K/uL   Eosinophils Relative 2 %   Eosinophils Absolute 0.1 0.0 - 0.5 K/uL   Basophils Relative 1 %   Basophils Absolute 0.1 0.0 - 0.1 K/uL   Immature Granulocytes 1 %   Abs Immature Granulocytes 0.06 0.00 - 0.07 K/uL  Direct antiglobulin test (not at [facility])  Result Value Ref Range   DAT, complement NEG    DAT, IgG      NEG Performed at [facility], [address] [place], [place] [address]    RADIOGRAPHIC STUDIES:  I have personally reviewed the radiological images as listed and agree with the findings in the report.  DG Chest 2 View Result Date: 01/16/2024 CLINICAL DATA:  Shortness of breath. Dry cough and dyspnea on exertion for 1 week. History of diabetes, hypertension, atrial fibrillation. EXAM: CHEST - 2 VIEW COMPARISON:  07/14/2023 FINDINGS: Postoperative changes in the mediastinum. Shallow inspiration. Cardiac enlargement. Mild interstitial pattern to the lungs is more prominent than prior study, possibly fibrosis or edema. No pleural effusion or pneumothorax. Mediastinal contours appear intact. Calcification of the aorta.  Degenerative  changes in the spine. IMPRESSION: Cardiac enlargement. Mild interstitial pattern to the lungs, more prominent than prior study. This may indicate fibrosis or edema. No focal consolidation. Electronically Signed   By: Elsie Gravely M.D.   On: 01/16/2024 21:28   CT Renal Stone Study Result Date: 01/16/2024 CLINICAL DATA:  New acute kidney injury. Abnormal renal labs. Fatigue. EXAM: CT ABDOMEN AND PELVIS WITHOUT CONTRAST TECHNIQUE: Multidetector CT imaging of the abdomen and pelvis was performed following the standard protocol without IV contrast. RADIATION DOSE REDUCTION: This exam was performed according to the departmental dose-optimization program which includes automated exposure control, adjustment of the mA and/or kV according to patient size and/or use of iterative reconstruction technique. COMPARISON:  CT abdomen and pelvis 07/14/2023. Radionuclide biliary scan 07/15/2023 FINDINGS: Lower chest: Small bilateral pleural effusions. Interstitial fibrosis in the lung bases. Cardiac enlargement. Hepatobiliary: Cholelithiasis with multiple stones in the gallbladder. Gallbladder is contracted with evidence of pericholecystic edema. Similar findings were present on prior CT with radionuclide biliary scan findings suggesting chronic cholecystitis. No developing bile duct dilatation. Pancreas: Unremarkable. No pancreatic ductal dilatation or surrounding inflammatory changes. Spleen: Normal in size without focal abnormality. Adrenals/Urinary Tract: No adrenal gland nodules. Kidneys are symmetrical. Intrarenal calcifications bilaterally likely representing vascular calcifications. No definite stone identified. Left renal cyst measuring 4.5 cm diameter, unchanged. No imaging follow-up is indicated. No hydronephrosis or hydroureter. Bladder wall is mildly thickened, possibly due to under distention or cystitis. Correlate with urinalysis. Stomach/Bowel: Stomach, small bowel, and colon are not abnormally  distended. Scattered stool throughout the colon. Colonic diverticula without evidence of acute diverticulitis. Wall thickening in the sigmoid region is likely muscular hypertrophy. Appendix is not identified. Vascular/Lymphatic: Aortic atherosclerosis. No enlarged abdominal or pelvic lymph nodes. Reproductive: Prostate is unremarkable. Other: Small amount of free fluid in the pelvis is nonspecific but likely reactive. No free air. Edema in the abdominal wall fat. Small right inguinal hernia containing fat and fluid. Musculoskeletal: Spondylolysis with mild spondylolisthesis at L5-S1. Degenerative changes in the spine. No acute bony abnormalities. IMPRESSION: 1. No renal or ureteral stone or obstruction. Intrarenal calcifications bilaterally are likely vascular. 2. Mild bladder wall thickening may indicate under distention or cystitis. 3. Cholelithiasis with gallbladder wall thickening and edema. Previous studies have suggested this to represent chronic cholecystitis. Based on CT appearance, acute cholecystitis is not excluded. 4. Small bilateral pleural effusions. Interstitial fibrosis in the lung bases. 5. Aortic atherosclerosis. 6. Colonic diverticulosis without evidence of acute diverticulitis. 7. Small right inguinal hernia containing fat. 8. Small amount of free fluid in the pelvis is nonspecific but probably reactive. Electronically Signed   By: Elsie Gravely M.D.   On: 01/16/2024 21:27    Orders Placed This Encounter  Procedures   CBC with Differential (Cancer Center Only)    Standing Status:   Future    Number of Occurrences:   1    Expiration Date:   01/24/2025   CMP (Cancer Center only)    Standing Status:   Future    Number of Occurrences:   1    Expiration Date:   01/24/2025   Lactate dehydrogenase    Standing Status:   Future    Number of Occurrences:   1    Expiration Date:   01/24/2025   Iron and Iron Binding Capacity ([facility] only)    Standing Status:   Future    Number of  Occurrences:   1    Expiration Date:   01/24/2025   Ferritin  Standing Status:   Future    Number of Occurrences:   1    Expiration Date:   01/24/2025   Vitamin B12    Standing Status:   Future    Number of Occurrences:   1    Expiration Date:   01/24/2025   Folate    Standing Status:   Future    Number of Occurrences:   1    Expiration Date:   01/24/2025   TSH    Standing Status:   Future    Number of Occurrences:   1    Expiration Date:   01/24/2025   Haptoglobin    Standing Status:   Future    Number of Occurrences:   1    Expiration Date:   01/24/2025   Kappa/lambda light chains    Standing Status:   Future    Number of Occurrences:   1    Expiration Date:   01/24/2025   Multiple Myeloma Panel (SPEP&IFE w/QIG)    Standing Status:   Future    Number of Occurrences:   1    Expiration Date:   01/24/2025   Reticulocytes    Standing Status:   Future    Number of Occurrences:   1    Expiration Date:   01/24/2025   CBC with Differential (Cancer Center Only)    Standing Status:   Future    Expiration Date:   01/24/2025   Iron and Iron Binding Capacity ([facility] only)    Standing Status:   Future    Expiration Date:   01/24/2025   Ferritin    Standing Status:   Future    Expiration Date:   01/24/2025   Direct antiglobulin test (not at [facility])    Standing Status:   Future    Number of Occurrences:   1    Expiration Date:   01/24/2025    Future Appointments  Date Time Provider Department Center  02/12/2024  3:40 PM Jeffrie Oneil BROCKS, MD [facility]  02/27/2024  9:20 AM Craig Alan SAUNDERS, PA-C [facility]  03/01/2024  2:00 PM [facility] None  03/01/2024  2:30 PM Jakiya Bookbinder, Chinit[place], MD [facility] None  03/18/2024  4:15 PM McCaughan, Dia D, DPM [facility]     I spent a total of 40 minutes during this encounter with the patient including review of chart and various tests results, discussions about plan of care and coordination of care plan.  This document was  completed utilizing speech recognition software. Grammatical errors, random word insertions, pronoun errors, and incomplete sentences are an occasional consequence of this system due to software limitations, ambient noise, and hardware issues. Any formal questions or concerns about the content, text or information contained within the body of this dictation should be directly addressed to the provider for clarification.

## 2024-01-25 NOTE — Assessment & Plan Note (Signed)
 Has history of severe diverticulosis which may contribute to chronic blood loss and anemia. - Recommend high-fiber diet to manage diverticulosis

## 2024-01-25 NOTE — Assessment & Plan Note (Signed)
 Stage II melanoma of the scalp status post wide excision in February 2025.  Has follow-up with surgical oncology department.  He did not receive adjuvant treatments.

## 2024-01-26 DIAGNOSIS — E785 Hyperlipidemia, unspecified: Secondary | ICD-10-CM | POA: Diagnosis not present

## 2024-01-26 DIAGNOSIS — I25118 Atherosclerotic heart disease of native coronary artery with other forms of angina pectoris: Secondary | ICD-10-CM | POA: Diagnosis not present

## 2024-01-26 DIAGNOSIS — I480 Paroxysmal atrial fibrillation: Secondary | ICD-10-CM | POA: Diagnosis not present

## 2024-01-26 DIAGNOSIS — Z952 Presence of prosthetic heart valve: Secondary | ICD-10-CM | POA: Diagnosis not present

## 2024-01-26 DIAGNOSIS — I5022 Chronic systolic (congestive) heart failure: Secondary | ICD-10-CM | POA: Diagnosis not present

## 2024-01-26 DIAGNOSIS — I5020 Unspecified systolic (congestive) heart failure: Secondary | ICD-10-CM | POA: Diagnosis not present

## 2024-01-26 LAB — KAPPA/LAMBDA LIGHT CHAINS
Kappa free light chain: 46.1 mg/L — ABNORMAL HIGH (ref 3.3–19.4)
Kappa, lambda light chain ratio: 1.26 (ref 0.26–1.65)
Lambda free light chains: 36.5 mg/L — ABNORMAL HIGH (ref 5.7–26.3)

## 2024-01-26 LAB — HAPTOGLOBIN: Haptoglobin: 211 mg/dL (ref 34–355)

## 2024-01-27 LAB — BASIC METABOLIC PANEL WITH GFR
BUN/Creatinine Ratio: 16 (ref 10–24)
BUN: 37 mg/dL — ABNORMAL HIGH (ref 8–27)
CO2: 17 mmol/L — ABNORMAL LOW (ref 20–29)
Calcium: 9 mg/dL (ref 8.6–10.2)
Chloride: 104 mmol/L (ref 96–106)
Creatinine, Ser: 2.28 mg/dL — ABNORMAL HIGH (ref 0.76–1.27)
Glucose: 122 mg/dL — ABNORMAL HIGH (ref 70–99)
Potassium: 4.4 mmol/L (ref 3.5–5.2)
Sodium: 139 mmol/L (ref 134–144)
eGFR: 29 mL/min/1.73 — ABNORMAL LOW (ref >59)

## 2024-01-27 LAB — PRO B NATRIURETIC PEPTIDE: NT-Pro BNP: 33710 pg/mL — ABNORMAL HIGH (ref 0–486)

## 2024-01-29 ENCOUNTER — Ambulatory Visit (HOSPITAL_BASED_OUTPATIENT_CLINIC_OR_DEPARTMENT_OTHER): Payer: Self-pay | Admitting: Family

## 2024-01-29 LAB — MULTIPLE MYELOMA PANEL, SERUM
Albumin SerPl Elph-Mcnc: 3.8 g/dL (ref 2.9–4.4)
Albumin/Glob SerPl: 1.7 (ref 0.7–1.7)
Alpha 1: 0.2 g/dL (ref 0.0–0.4)
Alpha2 Glob SerPl Elph-Mcnc: 0.8 g/dL (ref 0.4–1.0)
B-Globulin SerPl Elph-Mcnc: 0.7 g/dL (ref 0.7–1.3)
Gamma Glob SerPl Elph-Mcnc: 0.6 g/dL (ref 0.4–1.8)
Globulin, Total: 2.3 g/dL (ref 2.2–3.9)
IgA: 191 mg/dL (ref 61–437)
IgG (Immunoglobin G), Serum: 727 mg/dL (ref 603–1613)
IgM (Immunoglobulin M), Srm: 52 mg/dL (ref 15–143)
Total Protein ELP: 6.1 g/dL (ref 6.0–8.5)

## 2024-02-05 DIAGNOSIS — H04123 Dry eye syndrome of bilateral lacrimal glands: Secondary | ICD-10-CM | POA: Diagnosis not present

## 2024-02-05 DIAGNOSIS — H401134 Primary open-angle glaucoma, bilateral, indeterminate stage: Secondary | ICD-10-CM | POA: Diagnosis not present

## 2024-02-05 DIAGNOSIS — H5319 Other subjective visual disturbances: Secondary | ICD-10-CM | POA: Diagnosis not present

## 2024-02-05 DIAGNOSIS — E113493 Type 2 diabetes mellitus with severe nonproliferative diabetic retinopathy without macular edema, bilateral: Secondary | ICD-10-CM | POA: Diagnosis not present

## 2024-02-07 DIAGNOSIS — I480 Paroxysmal atrial fibrillation: Secondary | ICD-10-CM | POA: Diagnosis not present

## 2024-02-08 ENCOUNTER — Ambulatory Visit (HOSPITAL_BASED_OUTPATIENT_CLINIC_OR_DEPARTMENT_OTHER): Payer: Self-pay | Admitting: Family

## 2024-02-08 DIAGNOSIS — I471 Supraventricular tachycardia, unspecified: Secondary | ICD-10-CM

## 2024-02-08 DIAGNOSIS — I472 Ventricular tachycardia, unspecified: Secondary | ICD-10-CM

## 2024-02-08 DIAGNOSIS — I493 Ventricular premature depolarization: Secondary | ICD-10-CM

## 2024-02-12 ENCOUNTER — Ambulatory Visit (HOSPITAL_BASED_OUTPATIENT_CLINIC_OR_DEPARTMENT_OTHER): Admitting: Cardiology

## 2024-02-12 ENCOUNTER — Encounter (HOSPITAL_BASED_OUTPATIENT_CLINIC_OR_DEPARTMENT_OTHER): Payer: Self-pay | Admitting: Cardiology

## 2024-02-12 VITALS — BP 102/54 | HR 94 | Ht 64.0 in | Wt 127.0 lb

## 2024-02-12 DIAGNOSIS — Z952 Presence of prosthetic heart valve: Secondary | ICD-10-CM

## 2024-02-12 DIAGNOSIS — I480 Paroxysmal atrial fibrillation: Secondary | ICD-10-CM

## 2024-02-12 DIAGNOSIS — I5022 Chronic systolic (congestive) heart failure: Secondary | ICD-10-CM | POA: Diagnosis not present

## 2024-02-12 DIAGNOSIS — I25118 Atherosclerotic heart disease of native coronary artery with other forms of angina pectoris: Secondary | ICD-10-CM
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
## 2024-02-12 NOTE — Patient Instructions (Signed)
 Medication Instructions:  Please discontinue your Co enzyme Q10 and Minoxidil. Continue all other medications as listed.  *If you need a refill on your cardiac medications before your next appointment, please call your pharmacy*  Follow-Up: At [facility], you and your health needs are our priority.  As part of our continuing mission to provide you with exceptional heart care, our providers are all part of one team.  This team includes your primary Cardiologist (physician) and Advanced Practice Providers or APPs (Physician Assistants and Nurse Practitioners) who all work together to provide you with the care you need, when you need it.  Your next appointment:   6 month(s)  Provider:   Glennon Finder     We recommend signing up for the patient portal called MyChart.  Sign up information is provided on this After Visit Summary.  MyChart is used to connect with patients for Virtual Visits (Telemedicine).  Patients are able to view lab/test results, encounter notes, upcoming appointments, etc.  Non-urgent messages can be sent to your provider as well.   To learn more about what you can do with MyChart, go to [email or web address].

## 2024-02-14 NOTE — Progress Notes (Signed)
 Addendum: Reviewed and agree with assessment and management plan. Asha Grumbine, Carie Caddy, MD

## 2024-02-26 NOTE — Progress Notes (Unsigned)
 02/26/2024 Nathan [name] [id number] 1945/10/03  Referring provider: Yolande Toribio MATSU, MD Primary GI doctor: Dr. Albertus  ASSESSMENT AND PLAN:  Anemia without IDA in July 2024, continuing anemia, no overt GI bleeding, per patient 1 out of 3 Hemoccult cards slightly positive 01/25/2024  HGB 9.0 MCV 88.7 Platelets 162 01/25/2024 Iron 48 Ferritin 318 B12 5,321 Recent Labs    07/19/23 0255 07/21/23 0252 07/22/23 0323 07/23/23 0516 07/24/23 0430 07/25/23 0455 07/26/23 0501 01/16/24 0914 01/16/24 1545 01/25/24 1027  HGB 8.3* 8.6* 7.3* 8.3* 8.5* 9.0* 8.8* 8.5 Repeated and verified X2.* 8.4* 9.0*   Patient is on B12 injections once a month he is not on iron supplementation Patient sent to hematology for iron deficiency suspicious of bone marrow suppression versus anemia of chronic disease versus GI bleed. Patient had slightly elevated kappa lambda, LDH and decreased reticulocytes has not returned to hematology yet Due to patient's weakness and multiple comorbid conditions I would suggest supportive care rather than endoscopic evaluation if patient has iron deficiency anemia unless patient has overt GI bleeding or not responsive to iron.  DOE/cough with abnormal breath sounds and 2+ edema Will check BNP and chest x-ray ER precautions discussed with patient Close follow-up cardiology   Elevated LFTs with recent acute and chronic calculus cholecystitis and E. coli bacteremia January 2025, s/p [facility] 02/2023, not a surgical candidate No current AB pain, some mild nausea last seconds    Latest Ref Rng & Units 01/25/2024   10:27 AM 01/16/2024    3:45 PM 01/16/2024    9:14 AM  Hepatic Function  Total Protein 6.5 - 8.1 g/dL 6.7  6.9  6.7    7.0   Albumin 3.5 - 5.0 g/dL 4.0  4.0  4.4   AST 15 - 41 U/L 18  27  21    ALT 0 - 44 U/L 12  18  14    Alk Phosphatase 38 - 126 U/L 70  64  61   Total Bilirubin 0.0 - 1.2 mg/dL 1.3  0.9  0.9   Bilirubin, Direct 0.0 - 0.3 mg/dL   0.3   Status post  cholecystostomy tube 03/02/2023 for acute cholecystitis 07/14/2023 CT abdomen pelvis with contrast for weakness shows inflammatory changes involving gallbladder consistent with acute cholecystitis CBD dilation, diverticulosis without diverticulitis 07/15/2023 HIDA scan with delayed gallbladder filling visualized during second hour of imaging consistent with chronic calculus cholecystitis patent CBD -Will discuss starting ursodiol with Dr. Albertus and if this would be beneficial -Low fat diet -Recheck LFTs and INR  History of gastric ulcer Rare nausea, no vomiting,  melena Lifestyle changes discussed, avoid NSAIDS, ETOH Weight loss discussed with the patient Smoking cessation discussed in detail Well controlled on medication -on pantoprazole  40 mg once a day switched from omeprazole , - continue PPI BID   CAD status post bypass HFrEF Persistent atrial fibrillation cardiac cath 02/2016 with occluded SVG to RCA, severe 3 vessel CAD, patent LIMA to LAD, patent SVG to large OM2 07/15/2023 echo ejection fraction 35 to 40%, normal RVSP, status post aortic valve repair, no aortic stenosis Not on anticoagulation due to history of spontaneous bleeding requiring 3 units  Diverticulitis of colon, no fever, chills, vomiting 01/21/2023 diverticulitis on CT treated with Augmentin  Last colon was 2018 recall 3 years but patient states he did not come back for recall due to his heart history Due to comorbid conditions not a candidate for EGD colon at this time No symptoms at this time   Type 2  diabetes mellitus with hyperlipidemia [facility]) On medication  Patient Care Team: Yolande Toribio MATSU, MD as PCP - General (Internal Medicine) Jeffrie Oneil BROCKS, MD as PCP - Cardiology (Cardiology) Rubin Calamity, MD as Consulting Physician (General Surgery) Pyrtle, Gordy HERO, MD as Consulting Physician (Gastroenterology) Dohmeier, Dedra, MD as Consulting Physician (Neurology)  HISTORY OF PRESENT ILLNESS: 78 y.o. male with  a past medical history listed below presents for evaluation of anemia.   Patient was last seen in the office 01/16/2024 for anemia, however patient previously July 2024 did not have an iron deficiency.  Did have a B12 deficiency on supplementation.  Continued with normocytic anemia.  Had recent thrombocytopenia without splenomegaly and normal liver on CT.  Patient had recent [facility] tube August 2024  Discussed the use of AI scribe software for clinical note transcription with the patient, who gave verbal consent to proceed.  History of Present Illness   Nathan [name] is a 78 year old male with atrial fibrillation who presents with low hemoglobin and thrombocytopenia. He is accompanied by his wife. He was referred by [facility] for evaluation of low hemoglobin.  His hemoglobin was low during his last visit in July, but there was no iron deficiency at that time. He is on B12 injections monthly but not taking an iron supplement. Recent tests showed a small amount of blood in one out of three stool samples. No black tarry stools. Occasional nausea without vomiting, not associated with specific foods. No fever, chills, or significant abdominal pain. Bowel movements are regular, occurring once or twice daily, without loose stools.  He has a history of atrial fibrillation and wore a heart monitor for a week, with results pending. He is unsure about his next cardiologist appointment but recalls it might be in December.  He underwent a HIDA scan in January, which showed a normal spleen and liver. He had a colostomy tube placed in August of the previous year due to gallbladder issues, but the tube has since been removed. He still has his gallbladder.  He reports significant weakness and swelling in his legs, with the swelling persisting since his last hospital discharge. No pain, warmth, or redness in the legs.  He is currently taking pantoprazole  40 mg twice daily and vitamin D3. No shortness of breath but some  coughing. He has a history of melanoma on his scalp, getting evaluation there.  He  reports that he has never smoked. He has never used smokeless tobacco. He reports that he does not drink alcohol  and does not use drugs.  RELEVANT GI HISTORY, IMAGING AND LABS: Results          CBC    Component Value Date/Time   WBC 6.4 01/25/2024 1027   WBC 5.7 01/16/2024 1545   RBC 3.01 (L) 01/25/2024 1027   RBC 3.00 (L) 01/25/2024 1026   HGB 9.0 (L) 01/25/2024 1027   HGB 11.3 (L) 04/20/2023 1126   HCT 26.7 (L) 01/25/2024 1027   HCT 36.2 (L) 04/20/2023 1126   PLT 162 01/25/2024 1027   PLT 175 04/20/2023 1126   MCV 88.7 01/25/2024 1027   MCV 96 04/20/2023 1126   MCH 29.9 01/25/2024 1027   MCHC 33.7 01/25/2024 1027   RDW 17.3 (H) 01/25/2024 1027   RDW 16.4 (H) 04/20/2023 1126   LYMPHSABS 0.8 01/25/2024 1027   MONOABS 0.6 01/25/2024 1027   EOSABS 0.1 01/25/2024 1027   BASOSABS 0.1 01/25/2024 1027   Recent Labs    07/19/23 0255 07/21/23 0252 07/22/23  9676 07/23/23 0516 07/24/23 0430 07/25/23 0455 07/26/23 0501 01/16/24 0914 01/16/24 1545 01/25/24 1027  HGB 8.3* 8.6* 7.3* 8.3* 8.5* 9.0* 8.8* 8.5 Repeated and verified X2.* 8.4* 9.0*    CMP     Component Value Date/Time   NA 139 01/26/2024 1402   K 4.4 01/26/2024 1402   CL 104 01/26/2024 1402   CO2 17 (L) 01/26/2024 1402   GLUCOSE 122 (H) 01/26/2024 1402   GLUCOSE 96 01/25/2024 1027   BUN 37 (H) 01/26/2024 1402   CREATININE 2.28 (H) 01/26/2024 1402   CREATININE 2.20 (H) 01/25/2024 1027   CREATININE 1.92 (H) 03/20/2023 1333   CALCIUM  9.0 01/26/2024 1402   PROT 6.7 01/25/2024 1027   PROT 6.5 07/20/2021 0846   ALBUMIN 4.0 01/25/2024 1027   ALBUMIN 4.5 07/20/2021 0846   AST 18 01/25/2024 1027   ALT 12 01/25/2024 1027   ALKPHOS 70 01/25/2024 1027   BILITOT 1.3 (H) 01/25/2024 1027   GFRNONAA 30 (L) 01/25/2024 1027   GFRAA 59 (L) 04/04/2018 1604      Latest Ref Rng & Units 01/25/2024   10:27 AM 01/16/2024    3:45 PM  01/16/2024    9:14 AM  Hepatic Function  Total Protein 6.5 - 8.1 g/dL 6.7  6.9  6.7    7.0   Albumin 3.5 - 5.0 g/dL 4.0  4.0  4.4   AST 15 - 41 U/L 18  27  21    ALT 0 - 44 U/L 12  18  14    Alk Phosphatase 38 - 126 U/L 70  64  61   Total Bilirubin 0.0 - 1.2 mg/dL 1.3  0.9  0.9   Bilirubin, Direct 0.0 - 0.3 mg/dL   0.3       Current Medications:   Current Outpatient Medications (Endocrine & Metabolic):    metFORMIN (GLUCOPHAGE) 1000 MG tablet, Take 1,000 mg by mouth in the morning and at bedtime.  Current Outpatient Medications (Cardiovascular):    atorvastatin  (LIPITOR) 20 MG tablet, Take 1 tablet (20 mg total) by mouth daily.   furosemide  (LASIX ) 20 MG tablet, Take 1 tablet (20 mg total) by mouth daily.   metoprolol  succinate (TOPROL -XL) 100 MG 24 hr tablet, Take 1 tablet (100 mg total) by mouth daily. Take with or immediately following a meal.   nitroGLYCERIN  (NITROSTAT ) 0.4 MG SL tablet, Place 1 tablet under the tongue as needed.   Current Outpatient Medications (Analgesics):    allopurinol  (ZYLOPRIM ) 300 MG tablet, Take 300 mg by mouth daily.   aspirin  EC 81 MG tablet, Take 1 tablet (81 mg total) by mouth daily. Swallow whole.  Current Outpatient Medications (Hematological):    cyanocobalamin  (VITAMIN B12) 1000 MCG/ML injection, Inject 1 mL into the muscle every 30 (thirty) days.  Current Outpatient Medications (Other):    fish oil-omega-3 fatty acids 1000 MG capsule, Take 1 g by mouth 2 (two) times daily.   latanoprost  (XALATAN ) 0.005 % ophthalmic solution, Place 1 drop into both eyes at bedtime.   Multiple Vitamin (MULTI-VITAMIN) tablet, Take 1 tablet by mouth daily. (Patient taking differently: Take 1 tablet by mouth 2 (two) times daily.)   pantoprazole  (PROTONIX ) 40 MG tablet, Take 40 mg by mouth 2 (two) times daily.  Medical History:  Past Medical History:  Diagnosis Date   Allergy    Aortic stenosis    Arthritis    Balance problem 04/26/2022   Blood transfusion  without reported diagnosis    CAD (coronary artery disease)  Cataract    both eyes surgically removed   Diabetes mellitus, type 2 (HCC)    Diverticulosis of colon (without mention of hemorrhage)    Duodenal ulcer perforation (HCC)    blood transfusion   Flesh-eating bacteria (HCC) 1995   GERD (gastroesophageal reflux disease)    Glaucoma    pre glaucoma on Xalatan  drops   Heart murmur    History of diabetic retinopathy    History of gastrointestinal bleeding    History of necrotizing fasciitis    History of prosthetic aortic valve    Hypercholesteremia    Myocardial infarction (HCC) 02/23/2016   Neuropathic pain 04/26/2022   Nightmares    CHRONIC   OSA (obstructive sleep apnea)    borderline no CPAP    Personal history of colonic polyps 11/23/2009   TUBULAR ADENOMA   Postoperative anemia    Sinus bradycardia    Sleep apnea    no cpap   Stomach ulcer    Hx of   Systolic hypertension    Vitamin B12 deficiency    Allergies:  Allergies  Allergen Reactions   Shellfish-Derived Products Anaphylaxis   Nsaids Other (See Comments)    H/o gastric ulcers = NO NSAIDs per GI   Morphine  Other (See Comments)    Hallucinations    Zolpidem Tartrate Other (See Comments)    Hallucinations     Surgical History:  He  has a past surgical history that includes Retinal laser procedure; Coronary artery bypass graft (2010); Scrotal surgery (1995); Hernia repair (1972); Aortic valve replacement; flesh eating disease surgery ; Umbilical hernia repair (N/A, 04/10/2013); Cataract extraction w/ intraocular lens implant; Blepharoplasty (11/24/2014); Cardiac catheterization (N/A, 03/01/2016); Colonoscopy; Polypectomy; IR Perc Cholecystostomy (03/02/2023); IR EXCHANGE BILIARY DRAIN (03/07/2023); and IR EXCHANGE BILIARY DRAIN (04/18/2023). Family History:  His family history includes Alcohol  abuse in his father; Cirrhosis in his father; Colon cancer (age of onset: 52) in his mother; Diabetes in his  mother and sister; Hypertension in his mother and sister; Rectal cancer in his mother.  REVIEW OF SYSTEMS  : All other systems reviewed and negative except where noted in the History of Present Illness.  PHYSICAL EXAM: There were no vitals taken for this visit. Physical Exam   GENERAL APPEARANCE: Elderly, frail, in no apparent distress. HEENT: No cervical lymphadenopathy, unremarkable thyroid , sclerae anicteric, conjunctiva pink. RESPIRATORY: Respiratory effort normal, possible fluid in lungs with auscultation suggesting congestion. CARDIO: Irregularly irregular pulse, likely atrial fibrillation, peripheral pulses intact. ABDOMEN: Soft, non-distended, active bowel sounds in all 4 quadrants, no tenderness to palpation, umbilical hernia present. RECTAL: Declines. MUSCULOSKELETAL: Full ROM, antalgic, unsteady gait  SKIN: Dry, intact without rashes or lesions. No jaundice. NEURO: Alert, oriented, no focal deficits. PSYCH: Cooperative, normal mood and affect. EXTREMITIES: 1+ edema in left leg, 2+ edema in right leg, no pain on palpation of legs.  Negative Homan     Alan JONELLE Coombs, PA-C 1:25 PM

## 2024-02-27 ENCOUNTER — Other Ambulatory Visit (INDEPENDENT_AMBULATORY_CARE_PROVIDER_SITE_OTHER)

## 2024-02-27 ENCOUNTER — Ambulatory Visit (INDEPENDENT_AMBULATORY_CARE_PROVIDER_SITE_OTHER): Admitting: Physician Assistant

## 2024-02-27 ENCOUNTER — Encounter: Payer: Self-pay | Admitting: Physician Assistant

## 2024-02-27 ENCOUNTER — Ambulatory Visit: Payer: Self-pay | Admitting: Physician Assistant

## 2024-02-27 VITALS — BP 116/64 | HR 57 | Ht 62.0 in | Wt 123.4 lb

## 2024-02-27 DIAGNOSIS — I25810 Atherosclerosis of coronary artery bypass graft(s) without angina pectoris: Secondary | ICD-10-CM

## 2024-02-27 DIAGNOSIS — E785 Hyperlipidemia, unspecified: Secondary | ICD-10-CM | POA: Diagnosis not present

## 2024-02-27 DIAGNOSIS — E1169 Type 2 diabetes mellitus with other specified complication: Secondary | ICD-10-CM

## 2024-02-27 DIAGNOSIS — R0609 Other forms of dyspnea: Secondary | ICD-10-CM

## 2024-02-27 DIAGNOSIS — K5732 Diverticulitis of large intestine without perforation or abscess without bleeding: Secondary | ICD-10-CM

## 2024-02-27 DIAGNOSIS — I5020 Unspecified systolic (congestive) heart failure: Secondary | ICD-10-CM

## 2024-02-27 DIAGNOSIS — D509 Iron deficiency anemia, unspecified: Secondary | ICD-10-CM | POA: Diagnosis not present

## 2024-02-27 DIAGNOSIS — E538 Deficiency of other specified B group vitamins: Secondary | ICD-10-CM

## 2024-02-27 DIAGNOSIS — D649 Anemia, unspecified: Secondary | ICD-10-CM

## 2024-02-27 DIAGNOSIS — Z7984 Long term (current) use of oral hypoglycemic drugs: Secondary | ICD-10-CM

## 2024-02-27 DIAGNOSIS — Z8711 Personal history of peptic ulcer disease: Secondary | ICD-10-CM | POA: Diagnosis not present

## 2024-02-27 DIAGNOSIS — R7989 Other specified abnormal findings of blood chemistry: Secondary | ICD-10-CM

## 2024-02-27 DIAGNOSIS — K8010 Calculus of gallbladder with chronic cholecystitis without obstruction: Secondary | ICD-10-CM | POA: Diagnosis not present

## 2024-02-27 DIAGNOSIS — I480 Paroxysmal atrial fibrillation: Secondary | ICD-10-CM

## 2024-02-27 LAB — CBC WITH DIFFERENTIAL/PLATELET
Basophils Absolute: 0.1 K/uL (ref 0.0–0.1)
Basophils Relative: 1.2 % (ref 0.0–3.0)
Eosinophils Absolute: 0.3 K/uL (ref 0.0–0.7)
Eosinophils Relative: 4.2 % (ref 0.0–5.0)
HCT: 35.7 % — ABNORMAL LOW (ref 39.0–52.0)
Hemoglobin: 11.5 g/dL — ABNORMAL LOW (ref 13.0–17.0)
Lymphocytes Relative: 15.8 % (ref 12.0–46.0)
Lymphs Abs: 1 K/uL (ref 0.7–4.0)
MCHC: 32.3 g/dL (ref 30.0–36.0)
MCV: 90.8 fl (ref 78.0–100.0)
Monocytes Absolute: 0.7 K/uL (ref 0.1–1.0)
Monocytes Relative: 10 % (ref 3.0–12.0)
Neutro Abs: 4.6 K/uL (ref 1.4–7.7)
Neutrophils Relative %: 68.8 % (ref 43.0–77.0)
Platelets: 184 K/uL (ref 150.0–400.0)
RBC: 3.93 Mil/uL — ABNORMAL LOW (ref 4.22–5.81)
RDW: 18.5 % — ABNORMAL HIGH (ref 11.5–15.5)
WBC: 6.7 K/uL (ref 4.0–10.5)

## 2024-02-27 LAB — COMPREHENSIVE METABOLIC PANEL WITH GFR
ALT: 15 U/L (ref 0–53)
AST: 23 U/L (ref 0–37)
Albumin: 4.4 g/dL (ref 3.5–5.2)
Alkaline Phosphatase: 62 U/L (ref 39–117)
BUN: 66 mg/dL — ABNORMAL HIGH (ref 6–23)
CO2: 27 meq/L (ref 19–32)
Calcium: 10.2 mg/dL (ref 8.4–10.5)
Chloride: 99 meq/L (ref 96–112)
Creatinine, Ser: 2.35 mg/dL — ABNORMAL HIGH (ref 0.40–1.50)
GFR: 25.82 mL/min — ABNORMAL LOW (ref >60.00)
Glucose, Bld: 82 mg/dL (ref 70–99)
Potassium: 4.9 meq/L (ref 3.5–5.1)
Sodium: 139 meq/L (ref 135–145)
Total Bilirubin: 0.6 mg/dL (ref 0.2–1.2)
Total Protein: 7.1 g/dL (ref 6.0–8.3)

## 2024-02-27 LAB — IBC + FERRITIN
Ferritin: 142.1 ng/mL (ref 22.0–322.0)
Iron: 68 ug/dL (ref 42–165)
Saturation Ratios: 27.6 % (ref 20.0–50.0)
TIBC: 246.4 ug/dL — ABNORMAL LOW (ref 250.0–450.0)
Transferrin: 176 mg/dL — ABNORMAL LOW (ref 212.0–360.0)

## 2024-02-27 NOTE — Patient Instructions (Addendum)
 Your provider has requested that you go to the basement level for lab work before leaving today. Press B on the elevator. The lab is located at the first door on the left as you exit the elevator.  Advised to go to the ER if there is any severe weakness, severe abdominal pain, vomit blood, dark red blood in your bowel movement, shortness of breath or chest pain.   Follow the instructions on the Hemoccult cards and mail them back to us  when you are finished or you may take them directly to the lab in the basement of the [facility] building. We will call you with the results.   _______________________________________________________  If your blood pressure at your visit was 140/90 or greater, please contact your primary care physician to follow up on this.  _______________________________________________________  If you are age 40 or older, your body mass index should be between 23-30. Your Body mass index is 22.57 kg/m. If this is out of the aforementioned range listed, please consider follow up with your Primary Care Provider.  If you are age 19 or younger, your body mass index should be between 19-25. Your Body mass index is 22.57 kg/m. If this is out of the aformentioned range listed, please consider follow up with your Primary Care Provider.   ________________________________________________________  The [facility] providers would like to encourage you to use MYCHART to communicate with providers for non-urgent requests or questions.  Due to long hold times on the telephone, sending your provider a message by [facility] may be a faster and more efficient way to get a response.  Please allow 48 business hours for a response.  Please remember that this is for non-urgent requests.  _______________________________________________________  [facility] is using a team-based approach to care.  Your team is made up of your doctor and two to three APPS. Our APPS (Nurse Practitioners and  Physician Assistants) work with your physician to ensure care continuity for you. They are fully qualified to address your health concerns and develop a treatment plan. They communicate directly with your gastroenterologist to care for you. Seeing the Advanced Practice Practitioners on your physician's team can help you by facilitating care more promptly, often allowing for earlier appointments, access to diagnostic testing, procedures, and other specialty referrals.

## 2024-02-28 DIAGNOSIS — D510 Vitamin B12 deficiency anemia due to intrinsic factor deficiency: Secondary | ICD-10-CM | POA: Diagnosis not present

## 2024-02-29 LAB — PRO B NATRIURETIC PEPTIDE: NT-Pro BNP: 4187 pg/mL — ABNORMAL HIGH (ref 0–486)

## 2024-03-01 ENCOUNTER — Encounter: Payer: Self-pay | Admitting: Oncology

## 2024-03-01 ENCOUNTER — Inpatient Hospital Stay: Attending: Oncology

## 2024-03-01 ENCOUNTER — Inpatient Hospital Stay: Admitting: Oncology

## 2024-03-01 VITALS — BP 137/67 | HR 63 | Temp 97.6°F | Resp 17 | Ht 62.0 in | Wt 123.0 lb

## 2024-03-01 DIAGNOSIS — K922 Gastrointestinal hemorrhage, unspecified: Secondary | ICD-10-CM | POA: Diagnosis not present

## 2024-03-01 DIAGNOSIS — D631 Anemia in chronic kidney disease: Secondary | ICD-10-CM | POA: Diagnosis not present

## 2024-03-01 DIAGNOSIS — N189 Chronic kidney disease, unspecified: Secondary | ICD-10-CM | POA: Diagnosis not present

## 2024-03-01 DIAGNOSIS — N1832 Chronic kidney disease, stage 3b: Secondary | ICD-10-CM

## 2024-03-01 DIAGNOSIS — D500 Iron deficiency anemia secondary to blood loss (chronic): Secondary | ICD-10-CM | POA: Diagnosis not present

## 2024-03-01 DIAGNOSIS — D649 Anemia, unspecified: Secondary | ICD-10-CM

## 2024-03-01 LAB — FERRITIN: Ferritin: 252 ng/mL (ref 24–336)

## 2024-03-01 LAB — IRON AND IRON BINDING CAPACITY (CC-WL,HP ONLY)
Iron: 79 ug/dL (ref 45–182)
Saturation Ratios: 32 % (ref 17.9–39.5)
TIBC: 248 ug/dL — ABNORMAL LOW (ref 250–450)
UIBC: 169 ug/dL (ref 117–376)

## 2024-03-01 LAB — CBC WITH DIFFERENTIAL (CANCER CENTER ONLY)
Abs Immature Granulocytes: 0.02 K/uL (ref 0.00–0.07)
Basophils Absolute: 0.1 K/uL (ref 0.0–0.1)
Basophils Relative: 1 %
Eosinophils Absolute: 0.3 K/uL (ref 0.0–0.5)
Eosinophils Relative: 4 %
HCT: 32.9 % — ABNORMAL LOW (ref 39.0–52.0)
Hemoglobin: 11.2 g/dL — ABNORMAL LOW (ref 13.0–17.0)
Immature Granulocytes: 0 %
Lymphocytes Relative: 18 %
Lymphs Abs: 1.4 K/uL (ref 0.7–4.0)
MCH: 30.1 pg (ref 26.0–34.0)
MCHC: 34 g/dL (ref 30.0–36.0)
MCV: 88.4 fL (ref 80.0–100.0)
Monocytes Absolute: 0.7 K/uL (ref 0.1–1.0)
Monocytes Relative: 8 %
Neutro Abs: 5.3 K/uL (ref 1.7–7.7)
Neutrophils Relative %: 69 %
Platelet Count: 175 K/uL (ref 150–400)
RBC: 3.72 MIL/uL — ABNORMAL LOW (ref 4.22–5.81)
RDW: 17 % — ABNORMAL HIGH (ref 11.5–15.5)
WBC Count: 7.7 K/uL (ref 4.0–10.5)
nRBC: 0 % (ref 0.0–0.2)

## 2024-03-01 NOTE — Assessment & Plan Note (Signed)
 Anemia likely due to chronic blood loss from the gastrointestinal tract, possibly related to diverticulosis.  Clinical picture is also indicative of anemia of chronic disease, related to CKD.     On his consultation with us  on 01/25/2024, labs showed stable hemoglobin of 9.  White count and platelet count were within normal limits.  Creatinine 2.2, normal LFTs.  Iron studies showed no evidence of iron deficiency.  No evidence of B12, folate deficiency.  TSH, LDH were within normal limits.  Coombs test negative.  Haptoglobin within normal limits.  SPEP showed no evidence of M spike.  IFE unremarkable.  Both free kappa and lambda were elevated with normal ratio, consistent with chronic disease picture.  No evidence of monoclonal gammopathy.   Labs today showed hemoglobin of 11.2, normal MCV.  White count and platelet count are within normal limits.  - Monitor hemoglobin levels and transfuse if hemoglobin drops below 8 g/dL.  Will consider ESA treatments if hemoglobin is consistently below 9.  - Schedule follow-up appointment in three months. - Encourage consumption of greens for overall health and micronutrient intake.

## 2024-03-01 NOTE — Progress Notes (Signed)
 [facility]  HEMATOLOGY CLINIC PROGRESS NOTE  PATIENT NAME: Nathan [name]   MR#: [id number] DOB: Aug 22, 1945  Patient Care Team: Yolande Toribio MATSU, MD as PCP - General (Internal Medicine) Jeffrie Oneil BROCKS, MD as PCP - Cardiology (Cardiology) Rubin Calamity, MD as Consulting Physician (General Surgery) Pyrtle, Gordy HERO, MD as Consulting Physician (Gastroenterology) Dohmeier, Dedra, MD as Consulting Physician (Neurology)  Date of visit: 03/01/2024   ASSESSMENT & PLAN:   Nathan [name] is a 78 y.o. gentleman with a past medical history of stage II A melanoma on the scalp, status post wide excision on 08/17/2023, hypertension, diabetes mellitus, CAD status post CABG x 3, congestive heart failure, paroxysmal A-fib, severe diverticulosis, history of gastric ulcer, aortic stenosis status post aortic valve replacement in April 2010, peripheral vascular disease, glaucoma, dyslipidemia, B12 deficiency,OSA not on CPAP, was referred to our service in July 2025 for evaluation of normocytic anemia.     Anemia in chronic kidney disease Anemia likely due to chronic blood loss from the gastrointestinal tract, possibly related to diverticulosis.  Clinical picture is also indicative of anemia of chronic disease, related to CKD.     On his consultation with us  on 01/25/2024, labs showed stable hemoglobin of 9.  White count and platelet count were within normal limits.  Creatinine 2.2, normal LFTs.  Iron studies showed no evidence of iron deficiency.  No evidence of B12, folate deficiency.  TSH, LDH were within normal limits.  Coombs test negative.  Haptoglobin within normal limits.  SPEP showed no evidence of M spike.  IFE unremarkable.  Both free kappa and lambda were elevated with normal ratio, consistent with chronic disease picture.  No evidence of monoclonal gammopathy.   Labs today showed hemoglobin of 11.2, normal MCV.  White count and platelet count are within normal limits.  - Monitor  hemoglobin levels and transfuse if hemoglobin drops below 8 g/dL.  Will consider ESA treatments if hemoglobin is consistently below 9.  - Schedule follow-up appointment in three months. - Encourage consumption of greens for overall health and micronutrient intake.   I spent a total of 20 minutes during this encounter with the patient including review of chart and various tests results, discussions about plan of care and coordination of care plan.  I reviewed lab results and outside records for this visit and discussed relevant results with the patient. Diagnosis, plan of care and treatment options were also discussed in detail with the patient. Opportunity provided to ask questions and answers provided to his apparent satisfaction. Provided instructions to call our clinic with any problems, questions or concerns prior to return visit. I recommended to continue follow-up with PCP and sub-specialists. He verbalized understanding and agreed with the plan. No barriers to learning was detected.  Chinita Patten, MD  03/01/2024 4:51 PM  [facility] - A DEPT OF [facility] [address] [place] [address] Dept: [phone] Dept Fax: [phone]   CHIEF COMPLAINT/ REASON FOR VISIT:  Follow-up for anemia of chronic disease, related to CKD  INTERVAL HISTORY:  Discussed the use of AI scribe software for clinical note transcription with the patient, who gave verbal consent to proceed.  History of Present Illness Nathan [name] is a 78 year old male with chronic kidney disease who presents for follow-up of anemia.  During his last visit, his hemoglobin was 9 g/dL, which was stable compared to previous measurements. Recent lab results show an  improvement in hemoglobin levels to 11.2-11.5 g/dL without any new interventions or changes in his iron supplementation, as he is not taking any iron supplements.  A comprehensive workup was  conducted previously, including assessments of iron levels, vitamin B12, folic acid , and bone marrow evaluation, all of which returned normal results. There were no signs of myeloma or other malignancies in the bone marrow.  He reports no significant changes in his diet, although he mentions taking fewer pills than before. He and his family are trying to incorporate more greens into his diet.  No iron supplements are being taken.    SUMMARY OF HEMATOLOGIC HISTORY:  On 12/27/2023, labs at his PCPs office showed hemoglobin of 9.4, trended down to 9 on 01/01/2024, 8.4 on 01/08/2024 and 7.8 on 01/15/2024.  He received 1 unit of PRBC transfusion on 01/18/2024.   He was seen by GI on 01/16/2024 and sent to ED for AKI with creatinine of 2.2.  He had 1+ Hemoccult.  However not a candidate for EGD and colonoscopy because of comorbidities.   Referral was sent to us  for further evaluation and management of anemia.   He recently underwent a blood transfusion and was sent home with a kit to check for blood in his stool, which revealed a small red spot in one sample, prompting further investigation. He has not noticed any overt bleeding himself, except for a single instance of noticing something unusual the day after the transfusion, which has not recurred. No epistaxis, gum bleeding, or other bleeding problems.   He experiences shortness of breath and fatigue upon exertion, ongoing for about a year. He reports that he was very sick last year, had long hospital stays and rehabilitation, and has been persistently weak since then. He describes needing to sit down after exertion due to feeling 'real tired real quick.'   His past medical history includes diverticulosis. He denies cravings for unusual foods like ice chips but mentions a craving for [employer] drinks. He is currently receiving B12 injections monthly but does not recall taking iron supplements in the past.   Anemia likely due to chronic blood loss from  the gastrointestinal tract, possibly related to diverticulosis.  Clinical picture is also indicative of anemia of chronic disease, related to CKD.     On his consultation with us  on 01/25/2024, labs showed stable hemoglobin of 9.  White count and platelet count were within normal limits.  Creatinine 2.2, normal LFTs.  Iron studies showed no evidence of iron deficiency.  No evidence of B12, folate deficiency.  TSH, LDH were within normal limits.  Coombs test negative.  Haptoglobin within normal limits.  SPEP showed no evidence of M spike.  IFE unremarkable.  Both free kappa and lambda were elevated with normal ratio, consistent with chronic disease picture.  No evidence of monoclonal gammopathy.   Clinical picture is consistent with anemia of chronic disease related to CKD.   - Monitor hemoglobin levels and transfuse if hemoglobin drops below 8 g/dL.  Will consider ESA treatments if hemoglobin is consistently below 9.  I have reviewed the past medical history, past surgical history, social history and family history with the patient and they are unchanged from previous note.  ALLERGIES: He is allergic to shellfish-derived products, nsaids, morphine , and zolpidem tartrate.  MEDICATIONS:  Current Outpatient Medications  Medication Sig Dispense Refill   allopurinol  (ZYLOPRIM ) 300 MG tablet Take 300 mg by mouth daily.     aspirin  EC 81 MG tablet Take 1 tablet (  81 mg total) by mouth daily. Swallow whole.     atorvastatin  (LIPITOR) 20 MG tablet Take 1 tablet (20 mg total) by mouth daily.     cyanocobalamin  (VITAMIN B12) 1000 MCG/ML injection Inject 1 mL into the muscle every 30 (thirty) days.     fish oil-omega-3 fatty acids 1000 MG capsule Take 1 g by mouth 2 (two) times daily.     furosemide  (LASIX ) 20 MG tablet Take 1 tablet (20 mg total) by mouth daily. 30 tablet 11   latanoprost  (XALATAN ) 0.005 % ophthalmic solution Place 1 drop into both eyes at bedtime.     metFORMIN (GLUCOPHAGE) 1000 MG tablet  Take 1,000 mg by mouth in the morning and at bedtime.     metoprolol  succinate (TOPROL -XL) 100 MG 24 hr tablet Take 1 tablet (100 mg total) by mouth daily. Take with or immediately following a meal.     Multiple Vitamin (MULTI-VITAMIN) tablet Take 1 tablet by mouth daily. (Patient taking differently: Take 1 tablet by mouth 2 (two) times daily.)     nitroGLYCERIN  (NITROSTAT ) 0.4 MG SL tablet Place 1 tablet under the tongue as needed.  12   pantoprazole  (PROTONIX ) 40 MG tablet Take 40 mg by mouth 2 (two) times daily.     No current facility-administered medications for this visit.     REVIEW OF SYSTEMS:    Review of Systems - Oncology  All other pertinent systems were reviewed with the patient and are negative.  PHYSICAL EXAMINATION:   Onc Performance Status - 03/01/24 1439       ECOG Perf Status   ECOG Perf Status Ambulatory and capable of all selfcare but unable to carry out any work activities.  Up and about more than 50% of waking hours      KPS SCALE   KPS % SCORE Normal activity with effort, some s/s of disease          Vitals:   03/01/24 1425  BP: 137/67  Pulse: 63  Resp: 17  Temp: 97.6 F (36.4 C)  SpO2: 100%   Filed Weights   03/01/24 1425  Weight: 123 lb (55.8 kg)    Physical Exam Constitutional:      General: He is not in acute distress.    Appearance: Normal appearance.  HENT:     Head: Normocephalic and atraumatic.  Eyes:     Conjunctiva/sclera: Conjunctivae normal.  Cardiovascular:     Rate and Rhythm: Normal rate and regular rhythm.  Pulmonary:     Effort: Pulmonary effort is normal. No respiratory distress.  Abdominal:     General: There is no distension.  Neurological:     General: No focal deficit present.     Mental Status: He is alert and oriented to person, place, and time.  Psychiatric:        Mood and Affect: Mood normal.        Behavior: Behavior normal.      LABORATORY DATA:   I have reviewed the data as listed.  Results  for orders placed or performed in visit on 03/01/24  Iron and Iron Binding Capacity ([facility] only)  Result Value Ref Range   Iron 79 45 - 182 ug/dL   TIBC 751 (L) 749 - 549 ug/dL   Saturation Ratios 32 17.9 - 39.5 %   UIBC 169 117 - 376 ug/dL  CBC with Differential (Cancer Center Only)  Result Value Ref Range   WBC Count 7.7 4.0 - 10.5 K/uL   RBC  3.72 (L) 4.22 - 5.81 MIL/uL   Hemoglobin 11.2 (L) 13.0 - 17.0 g/dL   HCT 67.0 (L) 60.9 - 47.9 %   MCV 88.4 80.0 - 100.0 fL   MCH 30.1 26.0 - 34.0 pg   MCHC 34.0 30.0 - 36.0 g/dL   RDW 82.9 (H) 88.4 - 84.4 %   Platelet Count 175 150 - 400 K/uL   nRBC 0.0 0.0 - 0.2 %   Neutrophils Relative % 69 %   Neutro Abs 5.3 1.7 - 7.7 K/uL   Lymphocytes Relative 18 %   Lymphs Abs 1.4 0.7 - 4.0 K/uL   Monocytes Relative 8 %   Monocytes Absolute 0.7 0.1 - 1.0 K/uL   Eosinophils Relative 4 %   Eosinophils Absolute 0.3 0.0 - 0.5 K/uL   Basophils Relative 1 %   Basophils Absolute 0.1 0.0 - 0.1 K/uL   Immature Granulocytes 0 %   Abs Immature Granulocytes 0.02 0.00 - 0.07 K/uL     RADIOGRAPHIC STUDIES:  No recent pertinent imaging studies available to review.  Orders Placed This Encounter  Procedures   CBC with Differential ([facility]cer Center Only)    Standing Status:   Future    Expected Date:   06/01/2024    Expiration Date:   08/30/2024   CMP (Cancer Center only)    Standing Status:   Future    Expected Date:   06/01/2024    Expiration Date:   08/30/2024   Iron and Iron Binding Capacity ([facility] only)    Standing Status:   Future    Expected Date:   06/01/2024    Expiration Date:   08/30/2024   Ferritin    Standing Status:   Future    Expected Date:   06/01/2024    Expiration Date:   08/30/2024     Future Appointments  Date Time Provider Department Center  03/18/2024  4:15 PM Saxton, Awanda BIRCH, DPM [facility]  03/19/2024  3:00 PM Kennyth Chew, MD [facility]  04/24/2024  9:10 AM Pyrtle, Gordy HERO, MD [facility]  05/31/2024   3:15 PM [facility] None  05/31/2024  3:45 PM Kory Panjwani, Chinita, MD [facility] None     This document was completed utilizing speech recognition software. Grammatical errors, random word insertions, pronoun errors, and incomplete sentences are an occasional consequence of this system due to software limitations, ambient noise, and hardware issues. Any formal questions or concerns about the content, text or information contained within the body of this dictation should be directly addressed to the provider for clarification.

## 2024-03-05 DIAGNOSIS — H401134 Primary open-angle glaucoma, bilateral, indeterminate stage: Secondary | ICD-10-CM | POA: Diagnosis not present

## 2024-03-06 ENCOUNTER — Ambulatory Visit (INDEPENDENT_AMBULATORY_CARE_PROVIDER_SITE_OTHER)

## 2024-03-06 DIAGNOSIS — D509 Iron deficiency anemia, unspecified: Secondary | ICD-10-CM

## 2024-03-06 LAB — HEMOCCULT SLIDES (X 3 CARDS)
Fecal Occult Blood: NEGATIVE
OCCULT 1: NEGATIVE
OCCULT 2: NEGATIVE
OCCULT 3: NEGATIVE
OCCULT 4: NEGATIVE
OCCULT 5: NEGATIVE

## 2024-03-12 DIAGNOSIS — Z23 Encounter for immunization: Secondary | ICD-10-CM | POA: Diagnosis not present

## 2024-03-18 ENCOUNTER — Ambulatory Visit (INDEPENDENT_AMBULATORY_CARE_PROVIDER_SITE_OTHER): Admitting: Podiatry

## 2024-03-18 ENCOUNTER — Encounter: Payer: Self-pay | Admitting: Podiatry

## 2024-03-18 DIAGNOSIS — M79675 Pain in left toe(s): Secondary | ICD-10-CM

## 2024-03-18 DIAGNOSIS — B351 Tinea unguium: Secondary | ICD-10-CM | POA: Diagnosis not present

## 2024-03-18 DIAGNOSIS — M79674 Pain in right toe(s): Secondary | ICD-10-CM | POA: Diagnosis not present

## 2024-03-18 NOTE — Progress Notes (Signed)
 Subjective:  Patient ID: Nathan [name], male    DOB: 09/10/1945,  MRN: [id number]  [name] presents to clinic today for:  Chief Complaint  Patient presents with   Diabetes    [facility] nail trim. No calluses   A1c 6.8. 81 mg Asprin   Patient notes nails are thick, discolored, elongated and painful in shoegear when trying to ambulate.    PCP is Yolande Toribio MATSU, MD.  Past Medical History:  Diagnosis Date   Allergy    Aortic stenosis    Arthritis    Balance problem 04/26/2022   Blood transfusion without reported diagnosis    CAD (coronary artery disease)    Cataract    both eyes surgically removed   Diabetes mellitus, type 2 (HCC)    Diverticulosis of colon (without mention of hemorrhage)    Duodenal ulcer perforation (HCC)    blood transfusion   Flesh-eating bacteria (HCC) 1995   GERD (gastroesophageal reflux disease)    Glaucoma    pre glaucoma on Xalatan  drops   Heart murmur    History of diabetic retinopathy    History of gastrointestinal bleeding    History of necrotizing fasciitis    History of prosthetic aortic valve    Hypercholesteremia    Myocardial infarction (HCC) 02/23/2016   Neuropathic pain 04/26/2022   Nightmares    CHRONIC   OSA (obstructive sleep apnea)    borderline no CPAP    Personal history of colonic polyps 11/23/2009   TUBULAR ADENOMA   Postoperative anemia    Sinus bradycardia    Sleep apnea    no cpap   Stomach ulcer    Hx of   Systolic hypertension    Vitamin B12 deficiency    Past Surgical History:  Procedure Laterality Date   AORTIC VALVE REPLACEMENT     BLEPHAROPLASTY  11/24/2014   CARDIAC CATHETERIZATION N/A 03/01/2016   Procedure: Coronary/Graft Angiography;  Surgeon: Peter M Swaziland, MD;  Location: [facility];  Service: Cardiovascular;  Laterality: N/A;   CATARACT EXTRACTION W/ INTRAOCULAR LENS IMPLANT     OD   COLONOSCOPY     CORONARY ARTERY BYPASS GRAFT  2010   x 4   flesh eating disease surgery       surgeries x 7    HERNIA REPAIR  1972   IR EXCHANGE BILIARY DRAIN  03/07/2023   IR EXCHANGE BILIARY DRAIN  04/18/2023   IR PERC CHOLECYSTOSTOMY  03/02/2023   POLYPECTOMY     RETINAL LASER PROCEDURE     SCROTAL SURGERY  1995   resection   UMBILICAL HERNIA REPAIR N/A 04/10/2013   open UHR w Ventralex mesh patch - Dr Gladis   Allergies  Allergen Reactions   Shellfish-Derived Products Anaphylaxis   Nsaids Other (See Comments)    H/o gastric ulcers = NO NSAIDs per GI   Morphine  Other (See Comments)    Hallucinations    Zolpidem Tartrate Other (See Comments)    Hallucinations    Review of Systems: Negative except as noted in the HPI.  Objective:  Jusitn Salsgiver is a pleasant 78 y.o. male in NAD. AAO x 3.  Vascular Examination: Capillary refill time is 3-5 seconds to toes bilateral. Palpable pedal pulses b/l LE. Digital hair sparse b/l.  Skin temperature gradient WNL b/l.   Dermatological Examination: Pedal skin with decreased turgor, texture and tone b/l. No open wounds. No interdigital macerations b/l. Toenails x10 are 3mm thick, discolored, dystrophic with subungual debris. There  is pain with compression of the nail plates.  They are elongated x10     Latest Ref Rng & Units 07/14/2023   11:51 PM  Hemoglobin A1C  Hemoglobin-A1c 4.8 - 5.6 % 6.8    Assessment/Plan: 1. Pain due to onychomycosis of toenails of both feet    The mycotic toenails were sharply debrided x10 with sterile nail nippers and a power debriding burr to decrease bulk/thickness and length.    Return in about 3 months (around 06/17/2024) for [facility].   Awanda CHARM [place], DPM, FACFAS [facility]     [address] [place], [place] [address]                Office [phone]  Fax [phone]

## 2024-03-18 NOTE — Progress Notes (Signed)
 " Electrophysiology Office Note:   Date:  03/20/2024  ID:  Nathan [name], DOB [birth year], MRN [id number]  Primary Cardiologist: Oneil Parchment, MD Electrophysiologist: Fonda Kitty, MD      History of Present Illness:   Nathan [name] is a 78 y.o. male with h/o persistent atrial fibrillation, coronary artery disease, CABG, and aortic valve replacement who is being seen today for PACs.  Discussed the use of AI scribe software for clinical note transcription with the patient, who gave verbal consent to proceed.  History of Present Illness Nathan [name] is a 78 year old male with atrial fibrillation who presents for evaluation of extra heartbeats and atrial fibrillation management. He was referred by Dr. Parchment for evaluation of PACs detected on a heart monitor.  He has a history of atrial fibrillation, first identified during a hospital stay in January 2025 when he was unwell. EKGs at that time showed his heart was out of rhythm with rates of 100-105 beats per minute. Due to a high risk of bleeding, he was not placed on a blood thinner, as he had a recent bleeding episode in a muscle that required transfusions. He also has history of GI bleeding. He does not recall being on blood thinners such as Eliquis, Xarelto, or Pradaxa.  Recently, he wore a heart monitor which revealed extra heartbeats originating from the top chamber of his heart. He is currently on metoprolol  and does not experience symptoms like heart racing, fluttering, or skipping. He has not experienced symptoms such as heart fluttering or pounding.  His past medical history includes necrotizing fasciitis and undergoing bypass and valve surgery around 2010. During a recent visit for melanoma, his heart rate was noted to be very low, but he did not feel any symptoms at that time.   No new or acute complaints today.  Review of systems complete and found to be negative unless listed in HPI.   EP Information / Studies Reviewed:    EKG  is ordered today. Personal review as below.  EKG Interpretation Date/Time:  Tuesday March 19 2024 15:01:32 EDT Ventricular Rate:  62 PR Interval:  192 QRS Duration:  112 QT Interval:  424 QTC Calculation: 430 R Axis:   -47  Text Interpretation: Normal sinus rhythm Left axis deviation Septal infarct (cited on or before 19-Mar-2024) When compared with ECG of 29-Dec-2023 15:28, PACs less frequent today Confirmed by Kitty Fonda [phone]) on 03/19/2024 3:07:26 PM   EKG 07/2023: AF   Echo 07/2023:   1. Limited study.   2. Left ventricular ejection fraction, by estimation, is 35 to 40%. The  left ventricle has moderately decreased function. The left ventricle  demonstrates global hypokinesis, more prominent mid to distal anteroseptal  wall. The left ventricular internal  cavity size was mildly dilated.   3. No LV mural thrombus noted with Definity  contrast.   4. Right ventricular systolic function is normal. The right ventricular  size is normal.   5. Left atrial size was severely dilated.   6. The mitral valve is degenerative. Moderate mitral annular  calcification.   7. The aortic valve has been repaired/replaced. Aortic valve  regurgitation is not visualized. There is a 23 mm Edwards bioprosthetic  valve present in the aortic position. Aortic valve mean gradient measures  7.0 mmHg.   Zio 04/2023:   Atrial fibrillation 100%-average heart rate 73 bpm   Rare PVCs   6 nonsustained ventricular tachycardia runs longest was 12 beats at 193 bpm.  Asymptomatic.   Zio 12/2023:  Sinus rhythm average heart rate 76 bpm   7 nonsustained ventricular tachycardia runs occurred longest was 13 beats with an average rate of 112 bpm, fastest was 6 beats with maximum rate of 188 bpm.   5 supraventricular tachycardia runs occurred, longest was 24 seconds average rate of 104 bpm-likely atrial tachycardia   Second-degree AV block type I was present.   Frequent PACs 19%, rare PVCs   No evidence of  atrial fibrillation.    Risk Assessment/Calculations:    CHA2DS2-VASc Score = 6   This indicates a 9.7% annual risk of stroke. The patient's score is based upon: CHF History: 1 HTN History: 1 Diabetes History: 1 Stroke History: 0 Vascular Disease History: 1 Age Score: 2 Gender Score: 0             Physical Exam:   VS:  BP 110/74   Pulse 62   Ht 5' 2 (1.575 m)   Wt 123 lb 6.4 oz (56 kg)   SpO2 96%   BMI 22.57 kg/m    Wt Readings from Last 3 Encounters:  03/19/24 123 lb 6.4 oz (56 kg)  03/01/24 123 lb (55.8 kg)  02/27/24 123 lb 6 oz (56 kg)     GEN: Well nourished, well developed in no acute distress NECK: No JVD CARDIAC: Normal rate, regular rhythm RESPIRATORY:  Clear to auscultation without rales, wheezing or rhonchi  ABDOMEN: Soft, non-distended EXTREMITIES:  No edema; No deformity   ASSESSMENT AND PLAN:    #Frequent PACs: Burden 19%. Appears to be asymptomatic. Likely a trigger for AF. - Continue metoprolol  XL 100mg  daily.  #Chronic systolic heart failure: Well compensated on exam today. -GDMT limited by kidney dysfunction. Continue metoprolol . Lasix  to maintain euvolemia.  - Continue follow up with primary cardiologist, Dr. Jeffrie.  #CAD s/p CABG: Denies chest pain. - Continue low-dose aspirin  81 mg daily. - Continue atorvastatin  20mg  daily. - Continue follow up with primary cardiologist, Dr. Jeffrie.  #Persistent atrial fibrillation: Appears to have no cardiac awareness. Last episode in setting of sepsis. Unclear what overall burden truly is. He does have frequent PACs which are his likely trigger for AF. - Continue metoprolol  XL 100mg  daily.  #Secondary hypercoagulable state due to AF:  I have seen Nathan [name] in the office today who is being considered for a Watchman left atrial appendage closure device. I believe they will benefit from this procedure given their history of atrial fibrillation, CHA2DS2-VASc score of 6 and unadjusted ischemic stroke  rate of 9.7% per year. Unfortunately, the patient is not felt to be a long term anticoagulation candidate secondary to spontaneous bleeding from non-reversible causes, intramuscular hematoma and GI bleeding. The patient's chart has been reviewed and I feel that they would be a candidate for short term oral anticoagulation after Watchman implant.   It is my belief that after undergoing a LAA closure procedure, Nathan [name] will not need long term anticoagulation which eliminates anticoagulation side effects and major bleeding risk.   Procedural risks for the Watchman implant have been reviewed with the patient including a 0.5% risk of stroke, <1% risk of perforation and <1% risk of device embolization. Other risks include bleeding, vascular damage, tamponade, worsening renal function, and death. The patient understands these risk and wishes to proceed.    The published clinical data on the safety and effectiveness of WATCHMAN include but are not limited to the following: - Holmes DR, Jess BEARD, Sick P et al. for the PROTECT AF Investigators. Percutaneous closure of  the left atrial appendage versus warfarin therapy for prevention of stroke in patients with atrial fibrillation: a randomised non-inferiority trial. Lancet 2009; 374: 534-42. GLENWOOD Jess BEARD, Doshi SK, Jonita VEAR Satchel D et al. on behalf of the PROTECT AF Investigators. Percutaneous Left Atrial Appendage Closure for Stroke Prophylaxis in Patients With Atrial Fibrillation 2.3-Year Follow-up of the PROTECT AF (Watchman Left Atrial Appendage System for Embolic Protection in Patients With Atrial Fibrillation) Trial. Circulation 2013; 127:720-729. - Alli O, Doshi S,  Kar S, Reddy VY, Sievert H et al. Quality of Life Assessment in the Randomized PROTECT AF (Percutaneous Closure of the Left Atrial Appendage Versus Warfarin Therapy for Prevention of Stroke in Patients With Atrial Fibrillation) Trial of Patients at Risk for Stroke With Nonvalvular Atrial  Fibrillation. J Am Coll Cardiol 2013; 61:1790-8. GLENWOOD Satchel DR, Archer RAMAN, Price M, Whisenant B, Sievert H, Doshi S, Huber K, Reddy V. Prospective randomized evaluation of the Watchman left atrial appendage Device in patients with atrial fibrillation versus long-term warfarin therapy; the PREVAIL trial. Journal of the [employer] of Cardiology, Vol. 4, No. 1, 2014, 1-11. - Kar S, Doshi SK, Sadhu A, Horton R, Osorio J et al. Primary outcome evaluation of a next-generation left atrial appendage closure device: results from the PINNACLE FLX trial. Circulation 2021;143(18)1754-1762.   HAS-BLED score 5 Hypertension Yes  Abnormal renal and liver function (Dialysis, transplant, Cr >2.26 mg/dL /Cirrhosis or Bilirubin >2x Normal or AST/ALT/AP >3x Normal) Yes  Stroke No  Bleeding Yes  Labile INR (Unstable/high INR) No  Elderly (>65) Yes  Drugs or alcohol  (>= 8 drinks/week, anti-plt or NSAID) Yes   CHA2DS2-VASc Score = 6  The patient's score is based upon: CHF History: 1 HTN History: 1 Diabetes History: 1 Stroke History: 0 Vascular Disease History: 1 Age Score: 2 Gender Score: 0       During today's visit with the patient, I introduced the idea of LAA closure device for stroke prophylaxis. The patient has been off anti-coagulation d/t bleeding. He has not had AF documented since January 2025, which occurred during sepsis. Given his high PAC burden and h/o AF, it is highly likely he will go back into AF at some point. He has high stroke risk with CHADSVASc of at least 6. Risk and benefits of stroke prophylaxis for AF were discussed. If patient has recurrence of AF then Watchman would be reasonable although due to his co-morbidities he is above average operative risk. I will discuss with his primary cardiologist, Dr. Jeffrie, whether he thinks pursuing Watchman in the future would be worthwhile. For now, we will continue the current plan of close monitoring and leaving off anti-coagulation.  Follow up  with Dr. Kennyth as needed.   Signed, Fonda Kennyth, MD  "

## 2024-03-19 ENCOUNTER — Ambulatory Visit: Attending: Cardiology | Admitting: Cardiology

## 2024-03-19 ENCOUNTER — Encounter: Payer: Self-pay | Admitting: Cardiology

## 2024-03-19 VITALS — BP 110/74 | HR 62 | Ht 62.0 in | Wt 123.4 lb

## 2024-03-19 DIAGNOSIS — I4819 Other persistent atrial fibrillation: Secondary | ICD-10-CM | POA: Diagnosis not present

## 2024-03-19 DIAGNOSIS — D6869 Other thrombophilia: Secondary | ICD-10-CM | POA: Insufficient documentation

## 2024-03-19 DIAGNOSIS — I25118 Atherosclerotic heart disease of native coronary artery with other forms of angina pectoris: Secondary | ICD-10-CM | POA: Diagnosis not present

## 2024-03-19 DIAGNOSIS — I5022 Chronic systolic (congestive) heart failure: Secondary | ICD-10-CM | POA: Diagnosis not present

## 2024-03-19 DIAGNOSIS — I491 Atrial premature depolarization: Secondary | ICD-10-CM | POA: Diagnosis not present

## 2024-03-19 NOTE — Patient Instructions (Signed)

## 2024-03-26 DIAGNOSIS — H401114 Primary open-angle glaucoma, right eye, indeterminate stage: Secondary | ICD-10-CM | POA: Diagnosis not present

## 2024-04-01 DIAGNOSIS — E1151 Type 2 diabetes mellitus with diabetic peripheral angiopathy without gangrene: Secondary | ICD-10-CM | POA: Diagnosis not present

## 2024-04-01 DIAGNOSIS — I10 Essential (primary) hypertension: Secondary | ICD-10-CM | POA: Diagnosis not present

## 2024-04-01 DIAGNOSIS — Z1212 Encounter for screening for malignant neoplasm of rectum: Secondary | ICD-10-CM | POA: Diagnosis not present

## 2024-04-01 DIAGNOSIS — D649 Anemia, unspecified: Secondary | ICD-10-CM | POA: Diagnosis not present

## 2024-04-01 DIAGNOSIS — M109 Gout, unspecified: Secondary | ICD-10-CM | POA: Diagnosis not present

## 2024-04-01 DIAGNOSIS — E7849 Other hyperlipidemia: Secondary | ICD-10-CM | POA: Diagnosis not present

## 2024-04-01 DIAGNOSIS — E611 Iron deficiency: Secondary | ICD-10-CM | POA: Diagnosis not present

## 2024-04-01 DIAGNOSIS — D510 Vitamin B12 deficiency anemia due to intrinsic factor deficiency: Secondary | ICD-10-CM | POA: Diagnosis not present

## 2024-04-01 DIAGNOSIS — E785 Hyperlipidemia, unspecified: Secondary | ICD-10-CM | POA: Diagnosis not present

## 2024-04-01 DIAGNOSIS — Z125 Encounter for screening for malignant neoplasm of prostate: Secondary | ICD-10-CM | POA: Diagnosis not present

## 2024-04-08 DIAGNOSIS — K811 Chronic cholecystitis: Secondary | ICD-10-CM | POA: Diagnosis not present

## 2024-04-08 DIAGNOSIS — I10 Essential (primary) hypertension: Secondary | ICD-10-CM | POA: Diagnosis not present

## 2024-04-08 DIAGNOSIS — E1139 Type 2 diabetes mellitus with other diabetic ophthalmic complication: Secondary | ICD-10-CM | POA: Diagnosis not present

## 2024-04-08 DIAGNOSIS — I480 Paroxysmal atrial fibrillation: Secondary | ICD-10-CM | POA: Diagnosis not present

## 2024-04-08 DIAGNOSIS — Z13828 Encounter for screening for other musculoskeletal disorder: Secondary | ICD-10-CM | POA: Diagnosis not present

## 2024-04-08 DIAGNOSIS — D510 Vitamin B12 deficiency anemia due to intrinsic factor deficiency: Secondary | ICD-10-CM | POA: Diagnosis not present

## 2024-04-08 DIAGNOSIS — R82998 Other abnormal findings in urine: Secondary | ICD-10-CM | POA: Diagnosis not present

## 2024-04-08 DIAGNOSIS — N179 Acute kidney failure, unspecified: Secondary | ICD-10-CM | POA: Diagnosis not present

## 2024-04-08 DIAGNOSIS — R2681 Unsteadiness on feet: Secondary | ICD-10-CM | POA: Diagnosis not present

## 2024-04-08 DIAGNOSIS — C434 Malignant melanoma of scalp and neck: Secondary | ICD-10-CM | POA: Diagnosis not present

## 2024-04-08 DIAGNOSIS — I739 Peripheral vascular disease, unspecified: Secondary | ICD-10-CM | POA: Diagnosis not present

## 2024-04-08 DIAGNOSIS — Z1331 Encounter for screening for depression: Secondary | ICD-10-CM | POA: Diagnosis not present

## 2024-04-08 DIAGNOSIS — Z0000 Encounter for general adult medical examination without abnormal findings: Secondary | ICD-10-CM | POA: Diagnosis not present

## 2024-04-08 DIAGNOSIS — G4733 Obstructive sleep apnea (adult) (pediatric): Secondary | ICD-10-CM | POA: Diagnosis not present

## 2024-04-08 DIAGNOSIS — E1151 Type 2 diabetes mellitus with diabetic peripheral angiopathy without gangrene: Secondary | ICD-10-CM | POA: Diagnosis not present

## 2024-04-15 DIAGNOSIS — Z23 Encounter for immunization: Secondary | ICD-10-CM | POA: Diagnosis not present

## 2024-04-22 DIAGNOSIS — G4733 Obstructive sleep apnea (adult) (pediatric): Secondary | ICD-10-CM | POA: Diagnosis not present

## 2024-04-24 ENCOUNTER — Ambulatory Visit (INDEPENDENT_AMBULATORY_CARE_PROVIDER_SITE_OTHER): Admitting: Internal Medicine

## 2024-04-24 ENCOUNTER — Encounter: Payer: Self-pay | Admitting: Internal Medicine

## 2024-04-24 VITALS — BP 126/70 | HR 50 | Ht 63.0 in | Wt 127.0 lb

## 2024-04-24 DIAGNOSIS — K8010 Calculus of gallbladder with chronic cholecystitis without obstruction: Secondary | ICD-10-CM | POA: Diagnosis not present

## 2024-04-24 DIAGNOSIS — K5730 Diverticulosis of large intestine without perforation or abscess without bleeding: Secondary | ICD-10-CM | POA: Diagnosis not present

## 2024-04-24 DIAGNOSIS — M533 Sacrococcygeal disorders, not elsewhere classified: Secondary | ICD-10-CM

## 2024-04-24 DIAGNOSIS — D638 Anemia in other chronic diseases classified elsewhere: Secondary | ICD-10-CM | POA: Diagnosis not present

## 2024-04-24 DIAGNOSIS — K635 Polyp of colon: Secondary | ICD-10-CM

## 2024-04-24 NOTE — Progress Notes (Signed)
 Subjective:    Patient ID: Nathan [name], male    DOB: Jan 22, 1946, 78 y.o.   MRN: [id number]  HPI Nathan [name] is a 78 year old male with anemia of chronic disease, recent colonic diverticulitis who presents for follow-up.  He is here today with his wife.  He was seen on 02/27/2024 by Alan Coombs, PA-C.  He has been experiencing low blood counts, previously evaluated and found not to be due to blood loss anemia. Iron levels are stable, and stool cards from August were negative for blood.  He has a history of diverticulitis, treated with Augmentin  in July. A colonoscopy was not performed at that time due to comorbid conditions. His last colonoscopy in October 2018 showed diverticulosis and removal of small polyps, including a large adenoma in 2016. Recent hemocol cards were negative for blood.  He has a history of acute and chronic calculus cholecystitis and E. coli bacteremia. In August 2024, a percutaneous cholecystostomy tube was placed. A HIDA scan in January showed delayed gallbladder filling consistent with chronic cholecystitis.  He has a history of atrial fibrillation, coronary artery disease, and aortic valve replacement. He is maintained on metoprolol . There has been discussion of a Watchman device.  He reports recent issues with blood sugar levels after being taken off metformin, which spiked to the 300s. He resumed metformin, and blood sugar has since stabilized around 100.   He experiences intermittent pain near his tailbone, not related to bowel movements and resolves with position change.  Appetite is good, and he denies nausea or vomiting. He reports frequent urination at night, attributed to prostate issues. Large salads contribute to looser bowel movements, but no blood in stool.   Review of Systems As per HPI, otherwise negative  Current Medications, Allergies, Past Medical History, Past Surgical History, Family History and Social History were reviewed in [employer] record.    Objective:   Physical Exam BP 126/70   Pulse (!) 50   Ht 5' 3 (1.6 m)   Wt 127 lb (57.6 kg)   SpO2 99%   BMI 22.50 kg/m  Gen: awake, alert, NAD HEENT: anicteric  CV: Irregularly irregular Abd: soft, NT/ND, +BS throughout Ext: no c/c/e Neuro: nonfocal  LABS Hemoccult: Negative (02/2024) Ferritin: 255 (03/01/2024) TIBC: 248 (03/01/2024) Percent saturation: 32 (03/01/2024)  RADIOLOGY HIDA scan: Delayed gallbladder filling consistent with chronic calculus cholecystitis with patent CBD (07/2023)  DIAGNOSTIC Colonoscopy: Tattoo site in transverse colon normal without residual polyp; four polyps removed, all <64mm; diverticulosis throughout colon (05/04/2017)  PATHOLOGY Polyps: Tubular adenoma x1, benign colonic mucosa x3 (05/04/2017)  CT ABDOMEN AND PELVIS WITHOUT CONTRAST   TECHNIQUE: Multidetector CT imaging of the abdomen and pelvis was performed following the standard protocol without IV contrast.   RADIATION DOSE REDUCTION: This exam was performed according to the departmental dose-optimization program which includes automated exposure control, adjustment of the mA and/or kV according to patient size and/or use of iterative reconstruction technique.   COMPARISON:  CT abdomen and pelvis 07/14/2023. Radionuclide biliary scan 07/15/2023   FINDINGS: Lower chest: Small bilateral pleural effusions. Interstitial fibrosis in the lung bases. Cardiac enlargement.   Hepatobiliary: Cholelithiasis with multiple stones in the gallbladder. Gallbladder is contracted with evidence of pericholecystic edema. Similar findings were present on prior CT with radionuclide biliary scan findings suggesting chronic cholecystitis. No developing bile duct dilatation.   Pancreas: Unremarkable. No pancreatic ductal dilatation or surrounding inflammatory changes.   Spleen: Normal in size without focal abnormality.  Adrenals/Urinary Tract: No adrenal  gland nodules. Kidneys are symmetrical. Intrarenal calcifications bilaterally likely representing vascular calcifications. No definite stone identified. Left renal cyst measuring 4.5 cm diameter, unchanged. No imaging follow-up is indicated. No hydronephrosis or hydroureter. Bladder wall is mildly thickened, possibly due to under distention or cystitis. Correlate with urinalysis.   Stomach/Bowel: Stomach, small bowel, and colon are not abnormally distended. Scattered stool throughout the colon. Colonic diverticula without evidence of acute diverticulitis. Wall thickening in the sigmoid region is likely muscular hypertrophy. Appendix is not identified.   Vascular/Lymphatic: Aortic atherosclerosis. No enlarged abdominal or pelvic lymph nodes.   Reproductive: Prostate is unremarkable.   Other: Small amount of free fluid in the pelvis is nonspecific but likely reactive. No free air. Edema in the abdominal wall fat. Small right inguinal hernia containing fat and fluid.   Musculoskeletal: Spondylolysis with mild spondylolisthesis at L5-S1. Degenerative changes in the spine. No acute bony abnormalities.   IMPRESSION: 1. No renal or ureteral stone or obstruction. Intrarenal calcifications bilaterally are likely vascular. 2. Mild bladder wall thickening may indicate under distention or cystitis. 3. Cholelithiasis with gallbladder wall thickening and edema. Previous studies have suggested this to represent chronic cholecystitis. Based on CT appearance, acute cholecystitis is not excluded. 4. Small bilateral pleural effusions. Interstitial fibrosis in the lung bases. 5. Aortic atherosclerosis. 6. Colonic diverticulosis without evidence of acute diverticulitis. 7. Small right inguinal hernia containing fat. 8. Small amount of free fluid in the pelvis is nonspecific but probably reactive.     Electronically Signed   By: Elsie Gravely M.D.   On: 01/16/2024 21:27    Assessment &  Plan:   Chronic cholecystitis with previous cholecystostomy tube History of acute and chronic calculus cholecystitis with E. coli bacteremia. Not a surgical candidate. HIDA scan showed delayed gallbladder filling consistent with chronic cholecystitis with a patent CBD. - Not currently an active issue but will need to be monitored for any recurrent acute inflammation  Anemia of chronic disease Anemia not due to blood loss; stool cards heme negative. Adequate iron levels indicate associated with chronic diseases. - Avoid endoscopic procedures unless symptoms such as bleeding or pain develop. - Procedures are high risk.  He understands this and wishes to avoid endoscopic evaluation unless urgently needed.  Colonic diverticulosis Diverticulosis throughout the colon. Treated for diverticulitis in July with Augmentin .  Colonic polyps Last colonoscopy in October 2018 showed no residual polyp at tattoo site and removal of four small polyps. Overdue for colonoscopy but risks outweigh benefits given current health status and lack of symptoms. - Avoid elective colonoscopy unless symptoms such as bleeding or pain develop.  Coccyx pain Intermittent pain likely due to prolonged sitting and pressure. Resolves with position change. - Advise frequent position changes to alleviate pain.  Follow-up as needed  30 minutes total spent today including patient facing time, coordination of care, reviewing medical history/procedures/pertinent radiology studies, and documentation of the encounter.

## 2024-04-24 NOTE — Patient Instructions (Signed)
 Continue pantoprazole  40 mg twice daily.   Follow up as needed.   _______________________________________________________  If your blood pressure at your visit was 140/90 or greater, please contact your primary care physician to follow up on this.  _______________________________________________________  If you are age 79 or older, your body mass index should be between 23-30. Your Body mass index is 22.5 kg/m. If this is out of the aforementioned range listed, please consider follow up with your Primary Care Provider.  If you are age 46 or younger, your body mass index should be between 19-25. Your Body mass index is 22.5 kg/m. If this is out of the aformentioned range listed, please consider follow up with your Primary Care Provider.   ________________________________________________________  The [facility] providers would like to encourage you to use MYCHART to communicate with providers for non-urgent requests or questions.  Due to long hold times on the telephone, sending your provider a message by [facility] may be a faster and more efficient way to get a response.  Please allow 48 business hours for a response.  Please remember that this is for non-urgent requests.  _______________________________________________________  [facility] is using a team-based approach to care.  Your team is made up of your doctor and two to three APPS. Our APPS (Nurse Practitioners and Physician Assistants) work with your physician to ensure care continuity for you. They are fully qualified to address your health concerns and develop a treatment plan. They communicate directly with your gastroenterologist to care for you. Seeing the Advanced Practice Practitioners on your physician's team can help you by facilitating care more promptly, often allowing for earlier appointments, access to diagnostic testing, procedures, and other specialty referrals.

## 2024-04-30 DIAGNOSIS — H401134 Primary open-angle glaucoma, bilateral, indeterminate stage: Secondary | ICD-10-CM | POA: Diagnosis not present

## 2024-04-30 DIAGNOSIS — H43812 Vitreous degeneration, left eye: Secondary | ICD-10-CM | POA: Diagnosis not present

## 2024-05-13 DIAGNOSIS — E113493 Type 2 diabetes mellitus with severe nonproliferative diabetic retinopathy without macular edema, bilateral: Secondary | ICD-10-CM | POA: Diagnosis not present

## 2024-05-13 DIAGNOSIS — H401132 Primary open-angle glaucoma, bilateral, moderate stage: Secondary | ICD-10-CM | POA: Diagnosis not present

## 2024-05-13 DIAGNOSIS — H43813 Vitreous degeneration, bilateral: Secondary | ICD-10-CM | POA: Diagnosis not present

## 2024-05-27 DIAGNOSIS — C434 Malignant melanoma of scalp and neck: Secondary | ICD-10-CM | POA: Diagnosis not present

## 2024-05-31 ENCOUNTER — Inpatient Hospital Stay: Attending: Oncology

## 2024-05-31 ENCOUNTER — Inpatient Hospital Stay (HOSPITAL_BASED_OUTPATIENT_CLINIC_OR_DEPARTMENT_OTHER): Admitting: Oncology

## 2024-05-31 ENCOUNTER — Encounter: Payer: Self-pay | Admitting: Oncology

## 2024-05-31 VITALS — BP 139/80 | HR 37 | Temp 97.6°F | Resp 17 | Ht 63.0 in | Wt 128.6 lb

## 2024-05-31 DIAGNOSIS — N189 Chronic kidney disease, unspecified: Secondary | ICD-10-CM | POA: Diagnosis not present

## 2024-05-31 DIAGNOSIS — C434 Malignant melanoma of scalp and neck: Secondary | ICD-10-CM | POA: Insufficient documentation

## 2024-05-31 DIAGNOSIS — D631 Anemia in chronic kidney disease: Secondary | ICD-10-CM

## 2024-05-31 DIAGNOSIS — N1832 Chronic kidney disease, stage 3b: Secondary | ICD-10-CM

## 2024-05-31 LAB — CBC WITH DIFFERENTIAL (CANCER CENTER ONLY)
Abs Immature Granulocytes: 0.02 K/uL (ref 0.00–0.07)
Basophils Absolute: 0 K/uL (ref 0.0–0.1)
Basophils Relative: 1 %
Eosinophils Absolute: 0.5 K/uL (ref 0.0–0.5)
Eosinophils Relative: 8 %
HCT: 35.1 % — ABNORMAL LOW (ref 39.0–52.0)
Hemoglobin: 11.7 g/dL — ABNORMAL LOW (ref 13.0–17.0)
Immature Granulocytes: 0 %
Lymphocytes Relative: 27 %
Lymphs Abs: 1.5 K/uL (ref 0.7–4.0)
MCH: 30.2 pg (ref 26.0–34.0)
MCHC: 33.3 g/dL (ref 30.0–36.0)
MCV: 90.7 fL (ref 80.0–100.0)
Monocytes Absolute: 0.5 K/uL (ref 0.1–1.0)
Monocytes Relative: 8 %
Neutro Abs: 3.1 K/uL (ref 1.7–7.7)
Neutrophils Relative %: 56 %
Platelet Count: 175 K/uL (ref 150–400)
RBC: 3.87 MIL/uL — ABNORMAL LOW (ref 4.22–5.81)
RDW: 17.2 % — ABNORMAL HIGH (ref 11.5–15.5)
WBC Count: 5.6 K/uL (ref 4.0–10.5)
nRBC: 0 % (ref 0.0–0.2)

## 2024-05-31 LAB — CMP (CANCER CENTER ONLY)
ALT: 15 U/L (ref 0–44)
AST: 29 U/L (ref 15–41)
Albumin: 4.2 g/dL (ref 3.5–5.0)
Alkaline Phosphatase: 75 U/L (ref 38–126)
Anion gap: 12 (ref 5–15)
BUN: 48 mg/dL — ABNORMAL HIGH (ref 8–23)
CO2: 27 mmol/L (ref 22–32)
Calcium: 10.1 mg/dL (ref 8.9–10.3)
Chloride: 98 mmol/L (ref 98–111)
Creatinine: 2.3 mg/dL — ABNORMAL HIGH (ref 0.61–1.24)
GFR, Estimated: 28 mL/min — ABNORMAL LOW (ref >60)
Glucose, Bld: 105 mg/dL — ABNORMAL HIGH (ref 70–99)
Potassium: 4.7 mmol/L (ref 3.5–5.1)
Sodium: 136 mmol/L (ref 135–145)
Total Bilirubin: 0.5 mg/dL (ref 0.0–1.2)
Total Protein: 7.1 g/dL (ref 6.5–8.1)

## 2024-05-31 LAB — IRON AND IRON BINDING CAPACITY (CC-WL,HP ONLY)
Iron: 82 ug/dL (ref 45–182)
Saturation Ratios: 33 % (ref 17.9–39.5)
TIBC: 251 ug/dL (ref 250–450)
UIBC: 169 ug/dL

## 2024-05-31 LAB — FERRITIN: Ferritin: 269 ng/mL (ref 24–336)

## 2024-05-31 NOTE — Assessment & Plan Note (Signed)
 Stage II melanoma of the scalp status post wide excision in February 2025.  Has follow-up with surgical oncology department.  He did not receive adjuvant treatments previously.   He had recurrence of melanoma on the scalp, biopsy-proven on 05/27/2024 at [facility].  He has a PET scan coming up next week for staging.  Further treatment decisions will be made based on that.  Tentatively I schedule him an appointment in 3 months for follow-up of his anemia.  Will follow-up on melanoma status at that time.  If any adjuvant treatments are indicated based on above workup, I will see him sooner.

## 2024-05-31 NOTE — Progress Notes (Signed)
 [facility]  HEMATOLOGY CLINIC PROGRESS NOTE  PATIENT NAME: Nathan [name]   MR#: [id number] DOB: Sep 07, 1945  Patient Care Team: Yolande Toribio MATSU, MD as PCP - General (Internal Medicine) Jeffrie Oneil BROCKS, MD as PCP - Cardiology (Cardiology) Kennyth Chew, MD as PCP - Electrophysiology (Cardiology) Rubin Calamity, MD as Consulting Physician (General Surgery) Pyrtle, Gordy HERO, MD as Consulting Physician (Gastroenterology) Dohmeier, Dedra, MD as Consulting Physician (Neurology)  Date of visit: 05/31/2024   ASSESSMENT & PLAN:   Marinus Eicher is a 78 y.o. gentleman with a past medical history of stage II A melanoma on the scalp, status post wide excision on 08/17/2023, hypertension, diabetes mellitus, CAD status post CABG x 3, congestive heart failure, paroxysmal A-fib, severe diverticulosis, history of gastric ulcer, aortic stenosis status post aortic valve replacement in April 2010, peripheral vascular disease, glaucoma, dyslipidemia, B12 deficiency,OSA not on CPAP, was referred to our service in July 2025 for evaluation of normocytic anemia.     Anemia in chronic kidney disease Anemia likely due to chronic blood loss from the gastrointestinal tract, possibly related to diverticulosis.  Clinical picture is also indicative of anemia of chronic disease, related to CKD.     On his consultation with us  on 01/25/2024, labs showed stable hemoglobin of 9.  White count and platelet count were within normal limits.  Creatinine 2.2, normal LFTs.  Iron studies showed no evidence of iron deficiency.  No evidence of B12, folate deficiency.  TSH, LDH were within normal limits.  Coombs test negative.  Haptoglobin within normal limits.  SPEP showed no evidence of M spike.  IFE unremarkable.  Both free kappa and lambda were elevated with normal ratio, consistent with chronic disease picture.  No evidence of monoclonal gammopathy.   Labs today showed hemoglobin of 11.7, normal MCV.  White  count and platelet count are within normal limits.  - Monitor hemoglobin levels and transfuse if hemoglobin drops below 8 g/dL.  Will consider ESA treatments if hemoglobin is consistently below 9.  - Schedule follow-up appointment in three months. - Encourage consumption of greens for overall health and micronutrient intake.   Melanoma of scalp (HCC) Stage II melanoma of the scalp status post wide excision in February 2025.  Has follow-up with surgical oncology department.  He did not receive adjuvant treatments previously.   He had recurrence of melanoma on the scalp, biopsy-proven on 05/27/2024 at [facility].  He has a PET scan coming up next week for staging.  Further treatment decisions will be made based on that.  Tentatively I schedule him an appointment in 3 months for follow-up of his anemia.  Will follow-up on melanoma status at that time.  If any adjuvant treatments are indicated based on above workup, I will see him sooner.   I spent a total of 30 minutes during this encounter with the patient including review of chart and various tests results, discussions about plan of care and coordination of care plan.  I reviewed lab results and outside records for this visit and discussed relevant results with the patient. Diagnosis, plan of care and treatment options were also discussed in detail with the patient. Opportunity provided to ask questions and answers provided to his apparent satisfaction. Provided instructions to call our clinic with any problems, questions or concerns prior to return visit. I recommended to continue follow-up with PCP and sub-specialists. He verbalized understanding and agreed with the plan. No barriers to learning was detected.  Chinita Patten, MD  05/31/2024 5:31 PM  [facility] - A DEPT OF [facility] [address] [place] [address] Dept: [phone] Dept Fax: [phone]    CHIEF COMPLAINT/ REASON FOR VISIT:  Follow-up for anemia of chronic disease, related to CKD  INTERVAL HISTORY:  Discussed the use of AI scribe software for clinical note transcription with the patient, who gave verbal consent to proceed.  History of Present Illness [name] is a 78 year old male with atrial fibrillation and anemia who presents for follow-up of his heart rate issues and anemia management.  He experiences intermittent episodes of atrial fibrillation, which he becomes aware of when checking his blood pressure. No associated symptoms are reported during these episodes.  He is being monitored for anemia. His hemoglobin level is stable at 11.7 g/dL, with previous readings of 11.2 g/dL. Other blood counts, including white and platelet counts, are normal. His creatinine level is 2.3 mg/dL, slightly improved from 2.35 mg/dL, indicating stable kidney function. Iron levels are adequate, ruling out iron deficiency.  He has a history of significant weight loss and frequent bruising. These bruises do not resolve easily and have been present for about a year.    SUMMARY OF HEMATOLOGIC HISTORY:  On 12/27/2023, labs at his PCPs office showed hemoglobin of 9.4, trended down to 9 on 01/01/2024, 8.4 on 01/08/2024 and 7.8 on 01/15/2024.  He received 1 unit of PRBC transfusion on 01/18/2024.   He was seen by GI on 01/16/2024 and sent to ED for AKI with creatinine of 2.2.  He had 1+ Hemoccult.  However not a candidate for EGD and colonoscopy because of comorbidities.   Referral was sent to us  for further evaluation and management of anemia.   He recently underwent a blood transfusion and was sent home with a kit to check for blood in his stool, which revealed a small red spot in one sample, prompting further investigation. He has not noticed any overt bleeding himself, except for a single instance of noticing something unusual the day after the transfusion, which has not recurred. No  epistaxis, gum bleeding, or other bleeding problems.   He experiences shortness of breath and fatigue upon exertion, ongoing for about a year. He reports that he was very sick last year, had long hospital stays and rehabilitation, and has been persistently weak since then. He describes needing to sit down after exertion due to feeling 'real tired real quick.'   His past medical history includes diverticulosis. He denies cravings for unusual foods like ice chips but mentions a craving for [employer] drinks. He is currently receiving B12 injections monthly but does not recall taking iron supplements in the past.   Anemia likely due to chronic blood loss from the gastrointestinal tract, possibly related to diverticulosis.  Clinical picture is also indicative of anemia of chronic disease, related to CKD.     On his consultation with us  on 01/25/2024, labs showed stable hemoglobin of 9.  White count and platelet count were within normal limits.  Creatinine 2.2, normal LFTs.  Iron studies showed no evidence of iron deficiency.  No evidence of B12, folate deficiency.  TSH, LDH were within normal limits.  Coombs test negative.  Haptoglobin within normal limits.  SPEP showed no evidence of M spike.  IFE unremarkable.  Both free kappa and lambda were elevated with normal ratio, consistent with chronic disease picture.  No evidence of monoclonal gammopathy.   Clinical  picture is consistent with anemia of chronic disease related to CKD.   - Monitor hemoglobin levels and transfuse if hemoglobin drops below 8 g/dL.  Will consider ESA treatments if hemoglobin is consistently below 9.  I have reviewed the past medical history, past surgical history, social history and family history with the patient and they are unchanged from previous note.  ALLERGIES: He is allergic to shellfish protein-containing drug products, nsaids, morphine , and zolpidem tartrate.  MEDICATIONS:  Current Outpatient Medications  Medication  Sig Dispense Refill   allopurinol  (ZYLOPRIM ) 300 MG tablet Take 300 mg by mouth daily.     aspirin  EC 81 MG tablet Take 1 tablet (81 mg total) by mouth daily. Swallow whole.     atorvastatin  (LIPITOR) 20 MG tablet Take 1 tablet (20 mg total) by mouth daily.     cyanocobalamin  (VITAMIN B12) 1000 MCG/ML injection Inject 1 mL into the muscle every 30 (thirty) days.     fish oil-omega-3 fatty acids 1000 MG capsule Take 1 g by mouth 2 (two) times daily.     furosemide  (LASIX ) 20 MG tablet Take 1 tablet (20 mg total) by mouth daily. 30 tablet 11   latanoprost  (XALATAN ) 0.005 % ophthalmic solution Place 1 drop into both eyes at bedtime.     metFORMIN (GLUCOPHAGE) 1000 MG tablet Take 1,000 mg by mouth in the morning and at bedtime.     metoprolol  succinate (TOPROL -XL) 100 MG 24 hr tablet Take 1 tablet (100 mg total) by mouth daily. Take with or immediately following a meal.     Multiple Vitamin (MULTI-VITAMIN) tablet Take 1 tablet by mouth daily. (Patient taking differently: Take 1 tablet by mouth 2 (two) times daily.)     nitroGLYCERIN  (NITROSTAT ) 0.4 MG SL tablet Place 1 tablet under the tongue as needed.  12   pantoprazole  (PROTONIX ) 40 MG tablet Take 40 mg by mouth 2 (two) times daily.     No current facility-administered medications for this visit.     REVIEW OF SYSTEMS:    Review of Systems - Oncology  All other pertinent systems were reviewed with the patient and are negative.  PHYSICAL EXAMINATION:   Onc Performance Status - 05/31/24 1500       ECOG Perf Status   ECOG Perf Status Ambulatory and capable of all selfcare but unable to carry out any work activities.  Up and about more than 50% of waking hours      KPS SCALE   KPS % SCORE Normal activity with effort, some s/s of disease          Vitals:   05/31/24 1517  BP: 139/80  Pulse: (!) 37  Resp: 17  Temp: 97.6 F (36.4 C)  SpO2: 100%   Filed Weights   05/31/24 1517  Weight: 128 lb 9.6 oz (58.3 kg)    Physical  Exam Constitutional:      General: He is not in acute distress.    Appearance: Normal appearance.  HENT:     Head: Normocephalic and atraumatic.  Eyes:     Conjunctiva/sclera: Conjunctivae normal.  Cardiovascular:     Rate and Rhythm: Normal rate and regular rhythm.  Pulmonary:     Effort: Pulmonary effort is normal. No respiratory distress.  Abdominal:     General: There is no distension.  Skin:    Comments: Scalp lesion is bandaged.  Recently diagnosed with melanoma.  Neurological:     General: No focal deficit present.     Mental Status: He  is alert and oriented to person, place, and time.  Psychiatric:        Mood and Affect: Mood normal.        Behavior: Behavior normal.      LABORATORY DATA:   I have reviewed the data as listed.  Results for orders placed or performed in visit on 05/31/24  Iron and Iron Binding Capacity ([facility] only)  Result Value Ref Range   Iron 82 45 - 182 ug/dL   TIBC 748 749 - 549 ug/dL   Saturation Ratios 33 17.9 - 39.5 %   UIBC 169 ug/dL  CMP (Cancer Center only)  Result Value Ref Range   Sodium 136 135 - 145 mmol/L   Potassium 4.7 3.5 - 5.1 mmol/L   Chloride 98 98 - 111 mmol/L   CO2 27 22 - 32 mmol/L   Glucose, Bld 105 (H) 70 - 99 mg/dL   BUN 48 (H) 8 - 23 mg/dL   Creatinine 7.69 (H) 9.38 - 1.24 mg/dL   Calcium  10.1 8.9 - 10.3 mg/dL   Total Protein 7.1 6.5 - 8.1 g/dL   Albumin 4.2 3.5 - 5.0 g/dL   AST 29 15 - 41 U/L   ALT 15 0 - 44 U/L   Alkaline Phosphatase 75 38 - 126 U/L   Total Bilirubin 0.5 0.0 - 1.2 mg/dL   GFR, Estimated 28 (L) >60 mL/min   Anion gap 12 5 - 15  CBC with Differential (Cancer Center Only)  Result Value Ref Range   WBC Count 5.6 4.0 - 10.5 K/uL   RBC 3.87 (L) 4.22 - 5.81 MIL/uL   Hemoglobin 11.7 (L) 13.0 - 17.0 g/dL   HCT 64.8 (L) 60.9 - 47.9 %   MCV 90.7 80.0 - 100.0 fL   MCH 30.2 26.0 - 34.0 pg   MCHC 33.3 30.0 - 36.0 g/dL   RDW 82.7 (H) 88.4 - 84.4 %   Platelet Count 175 150 - 400 K/uL   nRBC  0.0 0.0 - 0.2 %   Neutrophils Relative % 56 %   Neutro Abs 3.1 1.7 - 7.7 K/uL   Lymphocytes Relative 27 %   Lymphs Abs 1.5 0.7 - 4.0 K/uL   Monocytes Relative 8 %   Monocytes Absolute 0.5 0.1 - 1.0 K/uL   Eosinophils Relative 8 %   Eosinophils Absolute 0.5 0.0 - 0.5 K/uL   Basophils Relative 1 %   Basophils Absolute 0.0 0.0 - 0.1 K/uL   Immature Granulocytes 0 %   Abs Immature Granulocytes 0.02 0.00 - 0.07 K/uL     RADIOGRAPHIC STUDIES:  No recent pertinent imaging studies available to review.  Orders Placed This Encounter  Procedures   CBC with Differential (Cancer Center Only)    Standing Status:   Future    Expiration Date:   05/31/2025   CMP (Cancer Center only)    Standing Status:   Future    Expiration Date:   05/31/2025   Iron and Iron Binding Capacity ([facility] only)    Standing Status:   Future    Expiration Date:   05/31/2025   Ferritin    Standing Status:   Future    Expiration Date:   05/31/2025     Future Appointments  Date Time Provider Department Center  06/17/2024  4:15 PM Loel Awanda BIRCH, DPM [facility]  08/30/2024  9:15 AM [facility] None  08/30/2024  9:45 AM Shanice Poznanski, Chinita, MD [facility] None     This document was  completed utilizing speech recognition software. Grammatical errors, random word insertions, pronoun errors, and incomplete sentences are an occasional consequence of this system due to software limitations, ambient noise, and hardware issues. Any formal questions or concerns about the content, text or information contained within the body of this dictation should be directly addressed to the provider for clarification.

## 2024-05-31 NOTE — Assessment & Plan Note (Addendum)
 Anemia likely due to chronic blood loss from the gastrointestinal tract, possibly related to diverticulosis.  Clinical picture is also indicative of anemia of chronic disease, related to CKD.     On his consultation with us  on 01/25/2024, labs showed stable hemoglobin of 9.  White count and platelet count were within normal limits.  Creatinine 2.2, normal LFTs.  Iron studies showed no evidence of iron deficiency.  No evidence of B12, folate deficiency.  TSH, LDH were within normal limits.  Coombs test negative.  Haptoglobin within normal limits.  SPEP showed no evidence of M spike.  IFE unremarkable.  Both free kappa and lambda were elevated with normal ratio, consistent with chronic disease picture.  No evidence of monoclonal gammopathy.   Labs today showed hemoglobin of 11.7, normal MCV.  White count and platelet count are within normal limits.  - Monitor hemoglobin levels and transfuse if hemoglobin drops below 8 g/dL.  Will consider ESA treatments if hemoglobin is consistently below 9.  - Schedule follow-up appointment in three months. - Encourage consumption of greens for overall health and micronutrient intake.

## 2024-06-05 DIAGNOSIS — C434 Malignant melanoma of scalp and neck: Secondary | ICD-10-CM | POA: Diagnosis not present

## 2024-06-10 DIAGNOSIS — C434 Malignant melanoma of scalp and neck: Secondary | ICD-10-CM | POA: Diagnosis not present

## 2024-06-10 DIAGNOSIS — Z79899 Other long term (current) drug therapy: Secondary | ICD-10-CM | POA: Diagnosis not present

## 2024-06-17 ENCOUNTER — Encounter: Payer: Self-pay | Admitting: Podiatry

## 2024-06-17 ENCOUNTER — Ambulatory Visit: Admitting: Podiatry

## 2024-06-17 DIAGNOSIS — M79675 Pain in left toe(s): Secondary | ICD-10-CM | POA: Diagnosis not present

## 2024-06-17 DIAGNOSIS — M79674 Pain in right toe(s): Secondary | ICD-10-CM

## 2024-06-17 DIAGNOSIS — B351 Tinea unguium: Secondary | ICD-10-CM

## 2024-06-17 NOTE — Progress Notes (Signed)
 Subjective:  Patient ID: Nathan [name], male    DOB: [birth year],  MRN: [id number]  Nathan [name] presents to clinic today for:  Chief Complaint  Patient presents with   Diabetes    [facility] NIDDM A1C 6.8. Toenail trim.    Patient notes nails are thick, discolored, elongated and painful in shoegear when trying to ambulate.    PCP is Yolande Toribio MATSU, MD.  Past Medical History:  Diagnosis Date   Allergy    Aortic stenosis    Arthritis    Balance problem 04/26/2022   Blood transfusion without reported diagnosis    CAD (coronary artery disease)    Cataract    both eyes surgically removed   Diabetes mellitus, type 2 (HCC)    Diverticulosis of colon (without mention of hemorrhage)    Duodenal ulcer perforation (HCC)    blood transfusion   Flesh-eating bacteria (HCC) 1995   GERD (gastroesophageal reflux disease)    Glaucoma    pre glaucoma on Xalatan  drops   Heart murmur    History of diabetic retinopathy    History of gastrointestinal bleeding    History of necrotizing fasciitis    History of prosthetic aortic valve    Hypercholesteremia    Melanoma (HCC)    Myocardial infarction (HCC) 02/23/2016   Neuropathic pain 04/26/2022   Nightmares    CHRONIC   OSA (obstructive sleep apnea)    borderline no CPAP    Personal history of colonic polyps 11/23/2009   TUBULAR ADENOMA   Postoperative anemia    Sinus bradycardia    Sleep apnea    no cpap   Stomach ulcer    Hx of   Systolic hypertension    Vitamin B12 deficiency    Past Surgical History:  Procedure Laterality Date   AORTIC VALVE REPLACEMENT     BLEPHAROPLASTY  11/24/2014   CARDIAC CATHETERIZATION N/A 03/01/2016   Procedure: Coronary/Graft Angiography;  Surgeon: Peter M Jordan, MD;  Location: [facility];  Service: Cardiovascular;  Laterality: N/A;   CATARACT EXTRACTION W/ INTRAOCULAR LENS IMPLANT     OD   COLONOSCOPY     CORONARY ARTERY BYPASS GRAFT  2010   x 4   flesh eating disease surgery       surgeries x 7    HERNIA REPAIR  1972   IR EXCHANGE BILIARY DRAIN  03/07/2023   IR EXCHANGE BILIARY DRAIN  04/18/2023   IR PERC CHOLECYSTOSTOMY  03/02/2023   POLYPECTOMY     RETINAL LASER PROCEDURE     SCROTAL SURGERY  1995   resection   UMBILICAL HERNIA REPAIR N/A 04/10/2013   open UHR w Ventralex mesh patch - Dr Gladis   Allergies  Allergen Reactions   Shellfish Protein-Containing Drug Products Anaphylaxis   Nsaids Other (See Comments)    H/o gastric ulcers = NO NSAIDs per GI   Morphine  Other (See Comments)    Hallucinations    Zolpidem Tartrate Other (See Comments)    Hallucinations    Review of Systems: Negative except as noted in the HPI.  Objective:  Nathan [name] is a pleasant 78 y.o. male in NAD. AAO x 3.  Vascular Examination: Capillary refill time is 3-5 seconds to toes bilateral. Palpable pedal pulses b/l LE. Digital hair sparse b/l.  Skin temperature gradient WNL b/l.   Dermatological Examination: Pedal skin with decreased turgor, texture and tone b/l. No open wounds. No interdigital macerations b/l. Toenails x10 are 3mm thick, discolored,  dystrophic with subungual debris. There is pain with compression of the nail plates.  They are elongated x10     Latest Ref Rng & Units 07/14/2023   11:51 PM  Hemoglobin A1C  Hemoglobin-A1c 4.8 - 5.6 % 6.8    Assessment/Plan: 1. Pain due to onychomycosis of toenails of both feet    The mycotic toenails were sharply debrided x10 with sterile nail nippers and a power debriding burr to decrease bulk/thickness and length.    No follow-ups on file.   Awanda CHARM [place], DPM, FACFAS [facility]     [name] [place], [place] [address]                Office [phone]  Fax [phone]

## 2024-06-25 DIAGNOSIS — Z85820 Personal history of malignant melanoma of skin: Secondary | ICD-10-CM | POA: Diagnosis not present

## 2024-06-25 DIAGNOSIS — D034 Melanoma in situ of scalp and neck: Secondary | ICD-10-CM | POA: Diagnosis not present

## 2024-06-25 DIAGNOSIS — C434 Malignant melanoma of scalp and neck: Secondary | ICD-10-CM | POA: Diagnosis not present

## 2024-06-25 DIAGNOSIS — L308 Other specified dermatitis: Secondary | ICD-10-CM | POA: Diagnosis not present

## 2024-06-25 DIAGNOSIS — D225 Melanocytic nevi of trunk: Secondary | ICD-10-CM | POA: Diagnosis not present

## 2024-06-25 DIAGNOSIS — D2262 Melanocytic nevi of left upper limb, including shoulder: Secondary | ICD-10-CM | POA: Diagnosis not present

## 2024-08-14 ENCOUNTER — Emergency Department (HOSPITAL_COMMUNITY)

## 2024-08-14 ENCOUNTER — Other Ambulatory Visit: Payer: Self-pay

## 2024-08-14 ENCOUNTER — Encounter (HOSPITAL_COMMUNITY): Payer: Self-pay

## 2024-08-14 ENCOUNTER — Inpatient Hospital Stay (HOSPITAL_COMMUNITY)
Admission: EM | Admit: 2024-08-14 | Source: Home / Self Care | Attending: Internal Medicine | Admitting: Internal Medicine

## 2024-08-14 DIAGNOSIS — K8050 Calculus of bile duct without cholangitis or cholecystitis without obstruction: Secondary | ICD-10-CM | POA: Diagnosis present

## 2024-08-14 DIAGNOSIS — B999 Unspecified infectious disease: Secondary | ICD-10-CM | POA: Diagnosis present

## 2024-08-14 DIAGNOSIS — A499 Bacterial infection, unspecified: Secondary | ICD-10-CM | POA: Diagnosis present

## 2024-08-14 DIAGNOSIS — R7989 Other specified abnormal findings of blood chemistry: Principal | ICD-10-CM

## 2024-08-14 DIAGNOSIS — K8309 Other cholangitis: Secondary | ICD-10-CM | POA: Diagnosis present

## 2024-08-14 DIAGNOSIS — K222 Esophageal obstruction: Secondary | ICD-10-CM

## 2024-08-14 LAB — I-STAT CG4 LACTIC ACID, ED: Lactic Acid, Venous: 2.9 mmol/L (ref 0.5–1.9)

## 2024-08-14 LAB — COMPREHENSIVE METABOLIC PANEL WITH GFR
ALT: 976 U/L — ABNORMAL HIGH (ref 0–44)
AST: 817 U/L — ABNORMAL HIGH (ref 15–41)
Albumin: 3.3 g/dL — ABNORMAL LOW (ref 3.5–5.0)
Alkaline Phosphatase: 190 U/L — ABNORMAL HIGH (ref 38–126)
Anion gap: 11 (ref 5–15)
BUN: 52 mg/dL — ABNORMAL HIGH (ref 8–23)
CO2: 23 mmol/L (ref 22–32)
Calcium: 8.6 mg/dL — ABNORMAL LOW (ref 8.9–10.3)
Chloride: 99 mmol/L (ref 98–111)
Creatinine, Ser: 1.88 mg/dL — ABNORMAL HIGH (ref 0.61–1.24)
GFR, Estimated: 36 mL/min — ABNORMAL LOW
Glucose, Bld: 200 mg/dL — ABNORMAL HIGH (ref 70–99)
Potassium: 5.2 mmol/L — ABNORMAL HIGH (ref 3.5–5.1)
Sodium: 133 mmol/L — ABNORMAL LOW (ref 135–145)
Total Bilirubin: 1.7 mg/dL — ABNORMAL HIGH (ref 0.0–1.2)
Total Protein: 5.9 g/dL — ABNORMAL LOW (ref 6.5–8.1)

## 2024-08-14 LAB — CBC WITH DIFFERENTIAL/PLATELET
Abs Immature Granulocytes: 0.08 10*3/uL — ABNORMAL HIGH (ref 0.00–0.07)
Basophils Absolute: 0 10*3/uL (ref 0.0–0.1)
Basophils Relative: 0 %
Eosinophils Absolute: 0 10*3/uL (ref 0.0–0.5)
Eosinophils Relative: 0 %
HCT: 34.5 % — ABNORMAL LOW (ref 39.0–52.0)
Hemoglobin: 11.1 g/dL — ABNORMAL LOW (ref 13.0–17.0)
Immature Granulocytes: 1 %
Lymphocytes Relative: 5 %
Lymphs Abs: 0.7 10*3/uL (ref 0.7–4.0)
MCH: 30.5 pg (ref 26.0–34.0)
MCHC: 32.2 g/dL (ref 30.0–36.0)
MCV: 94.8 fL (ref 80.0–100.0)
Monocytes Absolute: 1 10*3/uL (ref 0.1–1.0)
Monocytes Relative: 8 %
Neutro Abs: 11.4 10*3/uL — ABNORMAL HIGH (ref 1.7–7.7)
Neutrophils Relative %: 86 %
Platelets: 169 10*3/uL (ref 150–400)
RBC: 3.64 MIL/uL — ABNORMAL LOW (ref 4.22–5.81)
RDW: 17.7 % — ABNORMAL HIGH (ref 11.5–15.5)
WBC: 13.2 10*3/uL — ABNORMAL HIGH (ref 4.0–10.5)
nRBC: 0 % (ref 0.0–0.2)

## 2024-08-14 LAB — URINALYSIS, W/ REFLEX TO CULTURE (INFECTION SUSPECTED)
Bacteria, UA: NONE SEEN
Bilirubin Urine: NEGATIVE
Glucose, UA: NEGATIVE mg/dL
Hgb urine dipstick: NEGATIVE
Ketones, ur: NEGATIVE mg/dL
Leukocytes,Ua: NEGATIVE
Nitrite: NEGATIVE
Protein, ur: 100 mg/dL — AB
Specific Gravity, Urine: 1.014 (ref 1.005–1.030)
pH: 5 (ref 5.0–8.0)

## 2024-08-14 LAB — LIPASE, BLOOD: Lipase: 52 U/L — ABNORMAL HIGH (ref 11–51)

## 2024-08-14 LAB — PROTIME-INR
INR: 1.3 — ABNORMAL HIGH (ref 0.8–1.2)
Prothrombin Time: 16.4 s — ABNORMAL HIGH (ref 11.4–15.2)

## 2024-08-14 MED ORDER — ACETAMINOPHEN 500 MG PO TABS
1000.0000 mg | ORAL_TABLET | Freq: Once | ORAL | Status: AC
  Administered 2024-08-14: 1000 mg via ORAL
  Filled 2024-08-14: qty 2

## 2024-08-14 MED ORDER — SODIUM CHLORIDE 0.9 % IV BOLUS
1000.0000 mL | Freq: Once | INTRAVENOUS | Status: AC
  Administered 2024-08-14: 1000 mL via INTRAVENOUS

## 2024-08-14 MED ORDER — IOHEXOL 300 MG/ML  SOLN
80.0000 mL | Freq: Once | INTRAMUSCULAR | Status: AC | PRN
  Administered 2024-08-14: 80 mL via INTRAVENOUS

## 2024-08-14 MED ORDER — SODIUM CHLORIDE 0.9 % IV SOLN
2.0000 g | Freq: Once | INTRAVENOUS | Status: AC
  Administered 2024-08-15: 2 g via INTRAVENOUS
  Filled 2024-08-14: qty 20

## 2024-08-14 MED ORDER — METRONIDAZOLE 500 MG/100ML IV SOLN
500.0000 mg | Freq: Once | INTRAVENOUS | Status: AC
  Administered 2024-08-15: 500 mg via INTRAVENOUS
  Filled 2024-08-14: qty 100

## 2024-08-14 NOTE — ED Provider Notes (Signed)
 " [name] [facility]ERGENCY DEPARTMENT AT [facility] Provider Note   CSN: [id number] Arrival date & time: 08/14/24  2028     Patient presents with: Weakness   Nathan [name] is a 79 y.o. male.   79 yo M with a chief complaint of not feeling well.  Reported has been going on for a couple days.  EMS was called found to have a temperature of 101.  Has been having some confusion as well.  Patient seems a bit confused but states that he has not been coughing or congested.  He has thrown up a few times recently.  Denies diarrhea.  Has had increased urinary frequency but he denies any dysuria.  Denies rash.     Weakness      Prior to Admission medications  Medication Sig Start Date End Date Taking? Authorizing Provider  allopurinol  (ZYLOPRIM ) 300 MG tablet Take 300 mg by mouth daily.    [provider]  aspirin  EC 81 MG tablet Take 1 tablet (81 mg total) by mouth daily. Swallow whole. 01/19/24   Vannie Reche RAMAN, NP  atorvastatin  (LIPITOR) 20 MG tablet Take 1 tablet (20 mg total) by mouth daily. 07/26/23   Samtani, Jai-Gurmukh, MD  cyanocobalamin  (VITAMIN B12) 1000 MCG/ML injection Inject 1 mL into the muscle every 30 (thirty) days. 03/13/23   [provider]  fish oil-omega-3 fatty acids 1000 MG capsule Take 1 g by mouth 2 (two) times daily.    [provider]  furosemide  (LASIX ) 20 MG tablet Take 1 tablet (20 mg total) by mouth daily. 01/19/24 06/17/24  Walker, Caitlin S, NP  latanoprost  (XALATAN ) 0.005 % ophthalmic solution Place 1 drop into both eyes at bedtime.    [provider]  metFORMIN (GLUCOPHAGE) 1000 MG tablet Take 1,000 mg by mouth in the morning and at bedtime.    [provider]  metoprolol  succinate (TOPROL -XL) 100 MG 24 hr tablet Take 1 tablet (100 mg total) by mouth daily. Take with or immediately following a meal. 07/27/23   Samtani, Jai-Gurmukh, MD  Multiple Vitamin (MULTI-VITAMIN) tablet Take 1 tablet by mouth  daily. Patient taking differently: Take 1 tablet by mouth 2 (two) times daily.    [provider]  nitroGLYCERIN  (NITROSTAT ) 0.4 MG SL tablet Place 1 tablet under the tongue as needed. 09/01/16   [provider]  pantoprazole  (PROTONIX ) 40 MG tablet Take 40 mg by mouth 2 (two) times daily. 01/08/24   [provider]    Allergies: Shellfish protein-containing drug products, Nsaids, Morphine , and Zolpidem tartrate    Review of Systems  Neurological:  Positive for weakness.    Updated Vital Signs BP 125/76   Pulse (!) 104   Temp 98.2 F (36.8 C) (Oral)   Resp 19   SpO2 100%   Physical Exam Vitals and nursing note reviewed.  Constitutional:      Appearance: He is well-developed.     Comments: Skin is warm to touch  HENT:     Head: Normocephalic and atraumatic.  Eyes:     Pupils: Pupils are equal, round, and reactive to light.  Neck:     Vascular: No JVD.  Cardiovascular:     Rate and Rhythm: Regular rhythm. Tachycardia present.     Heart sounds: No murmur heard.    No friction rub. No gallop.  Pulmonary:     Effort: No respiratory distress.     Breath sounds: No wheezing.  Abdominal:     General: There is  no distension.     Tenderness: There is no abdominal tenderness. There is no guarding or rebound.  Musculoskeletal:        General: Normal range of motion.     Cervical back: Normal range of motion and neck supple.  Skin:    Coloration: Skin is not pale.     Findings: No rash.  Neurological:     Mental Status: He is alert and oriented to person, place, and time.  Psychiatric:        Behavior: Behavior normal.     (all labs ordered are listed, but only abnormal results are displayed) Labs Reviewed  CBC WITH DIFFERENTIAL/PLATELET - Abnormal; Notable for the following components:      Result Value   WBC 13.2 (*)    RBC 3.64 (*)    Hemoglobin 11.1 (*)    HCT 34.5 (*)    RDW 17.7 (*)    Neutro Abs 11.4 (*)    Abs Immature Granulocytes  0.08 (*)    All other components within normal limits  URINALYSIS, W/ REFLEX TO CULTURE (INFECTION SUSPECTED) - Abnormal; Notable for the following components:   Protein, ur 100 (*)    All other components within normal limits  COMPREHENSIVE METABOLIC PANEL WITH GFR - Abnormal; Notable for the following components:   Sodium 133 (*)    Potassium 5.2 (*)    Glucose, Bld 200 (*)    BUN 52 (*)    Creatinine, Ser 1.88 (*)    Calcium  8.6 (*)    Total Protein 5.9 (*)    Albumin 3.3 (*)    AST 817 (*)    ALT 976 (*)    Alkaline Phosphatase 190 (*)    Total Bilirubin 1.7 (*)    GFR, Estimated 36 (*)    All other components within normal limits  LIPASE, BLOOD - Abnormal; Notable for the following components:   Lipase 52 (*)    All other components within normal limits  PROTIME-INR - Abnormal; Notable for the following components:   Prothrombin Time 16.4 (*)    INR 1.3 (*)    All other components within normal limits  I-STAT CG4 LACTIC ACID, ED - Abnormal; Notable for the following components:   Lactic Acid, Venous 2.9 (*)    All other components within normal limits  CULTURE, BLOOD (ROUTINE X 2)  CULTURE, BLOOD (ROUTINE X 2)  RESP PANEL BY RT-PCR (RSV, FLU A&B, COVID)  [id number]  I-STAT CG4 LACTIC ACID, ED    EKG: EKG Interpretation Date/Time:  Wednesday August 14 2024 22:25:43 EST Ventricular Rate:  101 PR Interval:  197 QRS Duration:  112 QT Interval:  372 QTC Calculation: 483 R Axis:   -62  Text Interpretation: Sinus or ectopic atrial tachycardia Incomplete left bundle branch block Anterior Q waves, possibly due to ILBBB Since last tracing rate faster Otherwise no significant change Confirmed by Emil Share [phone]) on 08/14/2024 10:30:52 PM  Radiology: [facility] Chest Port 1 View Result Date: 08/14/2024 EXAM: 1 VIEW(S) XRAY OF THE CHEST 08/14/2024 09:18:00 PM COMPARISON: Comparison with 01/16/2024. CLINICAL HISTORY: Questionable sepsis - evaluate for abnormality. FINDINGS: LUNGS AND  PLEURA: Low lung volumes accentuate pulmonary vascularity. No focal pulmonary opacity. No pleural effusion. No pneumothorax. HEART AND MEDIASTINUM: Cardiomegaly. Sternotomy and CABG. BONES AND SOFT TISSUES: Remote left rib fracture. IMPRESSION: 1. No acute cardiopulmonary abnormality. 2. Cardiomegaly. Electronically signed by: Norman Gatlin MD 08/14/2024 09:21 PM EST RP Workstation: [id number]     .Critical Care  Performed  by: Emil Share, DO Authorized by: Emil Share, DO   Critical care provider statement:    Critical care time (minutes):  35   Critical care time was exclusive of:  Separately billable procedures and treating other patients   Critical care was time spent personally by me on the following activities:  Development of treatment plan with patient or surrogate, discussions with consultants, evaluation of patient's response to treatment, examination of patient, ordering and review of laboratory studies, ordering and review of radiographic studies, ordering and performing treatments and interventions, pulse oximetry, re-evaluation of patient's condition and review of old charts   Care discussed with: admitting provider      Medications Ordered in the ED  cefTRIAXone  (ROCEPHIN ) 2 g in sodium chloride  0.9 % 100 mL IVPB (has no administration in time range)  metroNIDAZOLE  (FLAGYL ) IVPB 500 mg (has no administration in time range)  sodium chloride  0.9 % bolus 1,000 mL (1,000 mLs Intravenous New Bag/Given 08/14/24 2108)  acetaminophen  (TYLENOL ) tablet 1,000 mg (1,000 mg Oral Given 08/14/24 2107)                                    Medical Decision Making Amount and/or Complexity of Data Reviewed Labs: ordered. Radiology: ordered.  Risk OTC drugs. Prescription drug management.   79 yo M with a chief complaint of altered mental status and fever.  Sounds like this has been going on for a couple days.  Patient is having some urinary symptoms.  Will obtain a laboratory evaluation.   Reassess.  Significant LFT elevation.  Will start on IV antibiotics.  Awaiting CT imaging.  Discussed case with hospitalist.    The patients results and plan were reviewed and discussed.   Any x-rays performed were independently reviewed by myself.   Differential diagnosis were considered with the presenting HPI.  Medications  cefTRIAXone  (ROCEPHIN ) 2 g in sodium chloride  0.9 % 100 mL IVPB (has no administration in time range)  metroNIDAZOLE  (FLAGYL ) IVPB 500 mg (has no administration in time range)  sodium chloride  0.9 % bolus 1,000 mL (1,000 mLs Intravenous New Bag/Given 08/14/24 2108)  acetaminophen  (TYLENOL ) tablet 1,000 mg (1,000 mg Oral Given 08/14/24 2107)    Vitals:   08/14/24 2043 08/14/24 2101  BP:  125/76  Pulse:  (!) 104  Resp:  19  Temp: 98.2 F (36.8 C)   TempSrc: Oral   SpO2:  100%    Final diagnoses:  LFT elevation    Admission/ observation were discussed with the admitting physician, patient and/or family and they are comfortable with the plan.       Final diagnoses:  LFT elevation    ED Discharge Orders     None          Emil Share, DO 08/14/24 2319  "

## 2024-08-14 NOTE — H&P (Incomplete)
 " History and Physical    Nathan [name] FMW:[id number] DOB: 17-Feb-1946 DOA: 08/14/2024  I have briefly reviewed the patient's prior medical records in [facility]  PCP: Yolande Toribio MATSU, MD  Patient coming from: home  Chief Complaint: Chest pain, nausea, vomiting  HPI: Nathan [name] is a 79 y.o. male with medical history significant of chronic calculous cholecystitis, hypertension, hyperlipidemia, DM2, CKD 4 who comes into the hospital with sudden onset nausea, vomiting, and not feeling well.  This happened right around 4 AM this morning when patient woke up started feeling sick all of a sudden.  He has had several episodes of vomiting throughout the morning and eventually the wife brought him to the emergency room.  With these episodes he has been experiencing chest pain x 1 for which his wife gave him a nitroglycerin .  Patient reports that it actually felt like gallbladder pain.  He also reports having shaking chills especially throughout the morning. He has a history of chronic calculous cholecystitis, prior hospitalizations with sepsis due to acute cholecystitis but not a candidate for surgery and had a percutaneous cholecystostomy drain which the wife tells me that eventually it fell off and never replaced.  Prior HIDA scans and imaging also showed chronic cholecystitis.  He has been followed by gastroenterology as an outpatient.  ED Course: In the emergency room he is afebrile, blood pressure is soft in the 100-120 systolic, heart rate is around 100 and he is satting well on room air.  Blood work reveals a creatinine of 1.88, potassium 5.2, AST 817, ALT 976 and total bilirubin 1.7.  Lactic acid was elevated 2.9.  He has a white count of 13.2.  He underwent a CT scan of the abdomen and pelvis which could not locate the gallbladder but it did show some gas and high density material in the expected location of the gallbladder with concerns for a gastric fistula in this area.  CT scan also  showed numerous noncalcified gallstones in the distal CBD with dilation and minimal pneumobilia.  Review of Systems: All systems reviewed, and apart from HPI, all negative  Past Medical History:  Diagnosis Date   Allergy    Aortic stenosis    Arthritis    Balance problem 04/26/2022   Blood transfusion without reported diagnosis    CAD (coronary artery disease)    Cataract    both eyes surgically removed   Diabetes mellitus, type 2 (HCC)    Diverticulosis of colon (without mention of hemorrhage)    Duodenal ulcer perforation (HCC)    blood transfusion   Flesh-eating bacteria (HCC) 1995   GERD (gastroesophageal reflux disease)    Glaucoma    pre glaucoma on Xalatan  drops   Heart murmur    History of diabetic retinopathy    History of gastrointestinal bleeding    History of necrotizing fasciitis    History of prosthetic aortic valve    Hypercholesteremia    Melanoma (HCC)    Myocardial infarction (HCC) 02/23/2016   Neuropathic pain 04/26/2022   Nightmares    CHRONIC   OSA (obstructive sleep apnea)    borderline no CPAP    Personal history of colonic polyps 11/23/2009   TUBULAR ADENOMA   Postoperative anemia    Sinus bradycardia    Sleep apnea    no cpap   Stomach ulcer    Hx of   Systolic hypertension    Vitamin B12 deficiency     Past Surgical History:  Procedure Laterality Date  AORTIC VALVE REPLACEMENT     BLEPHAROPLASTY  11/24/2014   CARDIAC CATHETERIZATION N/A 03/01/2016   Procedure: Coronary/Graft Angiography;  Surgeon: Peter M Jordan, MD;  Location: [facility] INVASIVE CV LAB;  Service: Cardiovascular;  Laterality: N/A;   CATARACT EXTRACTION W/ INTRAOCULAR LENS IMPLANT     OD   COLONOSCOPY     CORONARY ARTERY BYPASS GRAFT  2010   x 4   flesh eating disease surgery      surgeries x 7    HERNIA REPAIR  1972   IR EXCHANGE BILIARY DRAIN  03/07/2023   IR EXCHANGE BILIARY DRAIN  04/18/2023   IR PERC CHOLECYSTOSTOMY   03/02/2023   POLYPECTOMY     RETINAL LASER PROCEDURE     SCROTAL SURGERY  1995   resection   UMBILICAL HERNIA REPAIR N/A 04/10/2013   open UHR w Ventralex mesh patch - Dr Gladis     reports that he has never smoked. He has never used smokeless tobacco. He reports that he does not drink alcohol  and does not use drugs.  Allergies[1]  Family History  Problem Relation Age of Onset   Cirrhosis Father    Alcohol  abuse Father        father murdered   Rectal cancer Mother    Diabetes Mother    Colon cancer Mother 69       rectal cancer   Hypertension Mother    Hypertension Sister    Diabetes Sister    Heart attack Neg Hx    Stroke Neg Hx    Colon polyps Neg Hx    Esophageal cancer Neg Hx    Stomach cancer Neg Hx     Prior to Admission medications  Medication Sig Start Date End Date Taking? Authorizing Provider  allopurinol  (ZYLOPRIM ) 300 MG tablet Take 300 mg by mouth daily.    [provider]  aspirin  EC 81 MG tablet Take 1 tablet (81 mg total) by mouth daily. Swallow whole. 01/19/24   Vannie Reche RAMAN, NP  atorvastatin  (LIPITOR) 20 MG tablet Take 1 tablet (20 mg total) by mouth daily. 07/26/23   Samtani, Jai-Gurmukh, MD  cyanocobalamin  (VITAMIN B12) 1000 MCG/ML injection Inject 1 mL into the muscle every 30 (thirty) days. 03/13/23   [provider]  fish oil-omega-3 fatty acids 1000 MG capsule Take 1 g by mouth 2 (two) times daily.    [provider]  furosemide  (LASIX ) 20 MG tablet Take 1 tablet (20 mg total) by mouth daily. 01/19/24 06/17/24  Walker, Caitlin S, NP  latanoprost  (XALATAN ) 0.005 % ophthalmic solution Place 1 drop into both eyes at bedtime.    [provider]  metFORMIN (GLUCOPHAGE) 1000 MG tablet Take 1,000 mg by mouth in the morning and at bedtime.    [provider]  metoprolol  succinate (TOPROL -XL) 100 MG 24 hr tablet Take 1 tablet (100 mg total) by mouth daily. Take with or immediately following a meal.  07/27/23   Samtani, Jai-Gurmukh, MD  Multiple Vitamin (MULTI-VITAMIN) tablet Take 1 tablet by mouth daily. Patient taking differently: Take 1 tablet by mouth 2 (two) times daily.    [provider]  nitroGLYCERIN  (NITROSTAT ) 0.4 MG SL tablet Place 1 tablet under the tongue as needed. 09/01/16   [provider]  pantoprazole  (PROTONIX ) 40 MG tablet Take 40 mg by mouth 2 (two) times daily. 01/08/24   [provider]    Physical Exam: Vitals:   08/14/24 2043 08/14/24 2101  BP:  125/76  Pulse:  ROLLEN)  104  Resp:  19  Temp: 98.2 F (36.8 C)   TempSrc: Oral   SpO2:  100%   Constitutional: NAD, calm, comfortable Eyes: PERRL, lids and conjunctivae normal ENMT: Mucous membranes are moist.  Neck: normal, supple Respiratory: clear to auscultation bilaterally, no wheezing, no crackles. Normal respiratory effort. No accessory muscle use.  Cardiovascular: Regular rate and rhythm, no murmurs / rubs / gallops. No extremity edema. 2+ pedal pulses.  Abdomen: no tenderness, no masses palpated. Bowel sounds positive.  Musculoskeletal: no clubbing / cyanosis. Normal muscle tone.  Skin: no rashes, lesions, ulcers. No induration Neurologic: non focal   Labs on Admission: I have personally reviewed following labs and imaging studies  CBC: Recent Labs  Lab 08/14/24 2100  WBC 13.2*  NEUTROABS 11.4*  HGB 11.1*  HCT 34.5*  MCV 94.8  PLT 169   Basic Metabolic Panel: Recent Labs  Lab 08/14/24 2215  NA 133*  K 5.2*  CL 99  CO2 23  GLUCOSE 200*  BUN 52*  CREATININE 1.88*  CALCIUM  8.6*   Liver Function Tests: Recent Labs  Lab 08/14/24 2215  AST 817*  ALT 976*  ALKPHOS 190*  BILITOT 1.7*  PROT 5.9*  ALBUMIN 3.3*   Coagulation Profile: Recent Labs  Lab 08/14/24 2215  INR 1.3*   BNP (last 3 results) Recent Labs    01/26/24 1404 02/27/24 1034  PROBNP 33,710* 4,187*   CBG: No results for input(s): GLUCAP in the last 168 hours. Thyroid  Function  Tests: No results for input(s): TSH, T4TOTAL, FREET4, T3FREE, THYROIDAB in the last 72 hours. Urine analysis:    Component Value Date/Time   COLORURINE YELLOW 08/14/2024 2108   APPEARANCEUR CLEAR 08/14/2024 2108   LABSPEC 1.014 08/14/2024 2108   PHURINE 5.0 08/14/2024 2108   GLUCOSEU NEGATIVE 08/14/2024 2108   HGBUR NEGATIVE 08/14/2024 2108   BILIRUBINUR NEGATIVE 08/14/2024 2108   KETONESUR NEGATIVE 08/14/2024 2108   PROTEINUR 100 (A) 08/14/2024 2108   UROBILINOGEN 0.2 10/16/2008 1546   NITRITE NEGATIVE 08/14/2024 2108   LEUKOCYTESUR NEGATIVE 08/14/2024 2108     Radiological Exams on Admission: DG Chest Port 1 View Result Date: 08/14/2024 EXAM: 1 VIEW(S) XRAY OF THE CHEST 08/14/2024 09:18:00 PM COMPARISON: Comparison with 01/16/2024. CLINICAL HISTORY: Questionable sepsis - evaluate for abnormality. FINDINGS: LUNGS AND PLEURA: Low lung volumes accentuate pulmonary vascularity. No focal pulmonary opacity. No pleural effusion. No pneumothorax. HEART AND MEDIASTINUM: Cardiomegaly. Sternotomy and CABG. BONES AND SOFT TISSUES: Remote left rib fracture. IMPRESSION: 1. No acute cardiopulmonary abnormality. 2. Cardiomegaly. Electronically signed by: Norman Gatlin MD 08/14/2024 09:21 PM EST RP Workstation: [id number]    EKG: Independently reviewed.  Sinus tachycardia  Assessment/Plan Principal problem SIRS, concern for acute on chronic cholecystitis -patient presented to the hospital with nausea, vomiting of sudden onset, found to have possible fever at home, tachycardia, leukocytosis and significantly LFT elevation - CT scan of the abdomen and pelvis as above, gallbladder could not really be identified in the fossa but there are some concerns for gastric fistula with this area.  His abdomen is soft, nontender.  General surgery called, I spoke with Dr. Vanderbilt for consult - He also has what looks like choledocholithiasis, GI messaged for am consultation. - Will place on broad-spectrum  antibiotics with Zosyn , cultures were obtained, give IV fluids  Active problems Elevated LFTs -due to #1  CAD status post CABG -hold aspirin  and statin for now  Hyperkalemia -borderline, will monitor with fluids  Chronic kidney disease stage IV -baseline creatinine in  2025 ranging between 1.5 and 2.2, currently close to his baseline  Chronic systolic CHF -most recent 2D echo about a year ago showing LVEF 35 to 40%, normal RV.  For now hold furosemide , continue lower dose metoprolol   Essential hypertension -continue lower dose metoprolol  given soft blood pressures  PAF -continue lower dose metoprolol .  Does not appear to be on anticoagulation and he was being considered for Watchman for LAA closure.  He was not felt to be a good long-term anticoagulation candidate due to history of spontaneous intramuscular hematoma and GI bleeding  Hyperlipidemia -hold statin    DVT prophylaxis: Heparin  Code Status: ***  Family Communication: Wife at bedside Bed Type: Progressive Consults called: ***   At the time of admission, it appears that the appropriate admission status for this patient is INPATIENT as it is expected that patient will require hospital care > 2 midnights. This is judged to be reasonable and necessary in order to provide the required intensity of service to ensure the patient's safety given: presenting symptoms, initial radiographic and laboratory data and in the context of their chronic comorbidities. Together, these circumstances are felt to place patient at high at high risk for further clinical deterioration threatening life, limb, or organ.  Nilda Fendt, MD, PhD [facility]  Contact via [email or web address]  08/14/2024, 11:32 PM           [1] Allergies Allergen Reactions   Shellfish Protein-Containing Drug Products Anaphylaxis   Nsaids Other (See Comments)    H/o gastric ulcers = NO NSAIDs per GI   Morphine  Other (See Comments)    Hallucinations     Zolpidem Tartrate Other (See Comments)    Hallucinations  "

## 2024-08-14 NOTE — ED Triage Notes (Signed)
 Pt. Arrives for generalized weakness since 4Am today. Pt. Denies pain and is unable to say how he feels, but states that he just doesn't feel well. Per EMS pt. Has been more confused.  Vital Signs BP 110/60 Hr 106 TEMP 101.2 spO2 100% RA

## 2024-08-14 NOTE — H&P (Incomplete)
 " History and Physical    Nathan [name] FMW:[id number] DOB: 1946/03/25 DOA: 08/14/2024  I have briefly reviewed the patient's prior medical records in [facility].  Patient was seen and examined on 08/14/24  PCP: Nathan [name] MATSU, MD  Patient coming from: home  Chief Complaint: Chest pain, nausea, vomiting  HPI: Nathan [name] is a 79 y.o. male with medical history significant of chronic calculous cholecystitis, hypertension, mild dementia, hyperlipidemia, DM2, CKD 4 who comes into the hospital with sudden onset nausea, vomiting, and not feeling well.  This happened right around 4 AM this morning when patient woke up started feeling sick all of a sudden.  He has had several episodes of vomiting throughout the morning and eventually the wife brought him to the emergency room.  With these episodes he has been experiencing chest pain x 1 for which his wife gave him a nitroglycerin .  Patient reports that it actually felt like gallbladder pain.  He also reports having shaking chills especially throughout the morning. He has a history of chronic calculous cholecystitis, prior hospitalizations with sepsis due to acute cholecystitis but not a candidate for surgery and had a percutaneous cholecystostomy drain which the wife tells me that eventually it fell off and never replaced.  Prior HIDA scans and imaging also showed chronic cholecystitis.  He has been followed by gastroenterology as an outpatient.  ED Course: In the emergency room he is afebrile, blood pressure is soft in the 100-120 systolic, heart rate is around 100 and he is satting well on room air.  Blood work reveals a creatinine of 1.88, potassium 5.2, AST 817, ALT 976 and total bilirubin 1.7.  Lactic acid was elevated 2.9.  He has a white count of 13.2.  He underwent a CT scan of the abdomen and pelvis which could not locate the gallbladder but it did show some gas and high density material in the expected location of the gallbladder with  concerns for a gastric fistula in this area.  CT scan also showed numerous noncalcified gallstones in the distal CBD with dilation and minimal pneumobilia.  Review of Systems: All systems reviewed, and apart from HPI, all negative  Past Medical History:  Diagnosis Date   Allergy    Aortic stenosis    Arthritis    Balance problem 04/26/2022   Blood transfusion without reported diagnosis    CAD (coronary artery disease)    Cataract    both eyes surgically removed   Diabetes mellitus, type 2 (HCC)    Diverticulosis of colon (without mention of hemorrhage)    Duodenal ulcer perforation (HCC)    blood transfusion   Flesh-eating bacteria (HCC) 1995   GERD (gastroesophageal reflux disease)    Glaucoma    pre glaucoma on Xalatan  drops   Heart murmur    History of diabetic retinopathy    History of gastrointestinal bleeding    History of necrotizing fasciitis    History of prosthetic aortic valve    Hypercholesteremia    Melanoma (HCC)    Myocardial infarction (HCC) 02/23/2016   Neuropathic pain 04/26/2022   Nightmares    CHRONIC   OSA (obstructive sleep apnea)    borderline no CPAP    Personal history of colonic polyps 11/23/2009   TUBULAR ADENOMA   Postoperative anemia    Sinus bradycardia    Sleep apnea    no cpap   Stomach ulcer    Hx of   Systolic hypertension    Vitamin B12 deficiency  Past Surgical History:  Procedure Laterality Date   AORTIC VALVE REPLACEMENT     BLEPHAROPLASTY  11/24/2014   CARDIAC CATHETERIZATION N/A 03/01/2016   Procedure: Coronary/Graft Angiography;  Surgeon: Peter M Jordan, MD;  Location: [facility] INVASIVE CV LAB;  Service: Cardiovascular;  Laterality: N/A;   CATARACT EXTRACTION W/ INTRAOCULAR LENS IMPLANT     OD   COLONOSCOPY     CORONARY ARTERY BYPASS GRAFT  2010   x 4   flesh eating disease surgery      surgeries x 7    HERNIA REPAIR  1972   IR EXCHANGE BILIARY DRAIN  03/07/2023   IR EXCHANGE BILIARY DRAIN  04/18/2023   IR PERC  CHOLECYSTOSTOMY  03/02/2023   POLYPECTOMY     RETINAL LASER PROCEDURE     SCROTAL SURGERY  1995   resection   UMBILICAL HERNIA REPAIR N/A 04/10/2013   open UHR w Ventralex mesh patch - Dr Gladis     reports that he has never smoked. He has never used smokeless tobacco. He reports that he does not drink alcohol  and does not use drugs.  Allergies[1]  Family History  Problem Relation Age of Onset   Cirrhosis Father    Alcohol  abuse Father        father murdered   Rectal cancer Mother    Diabetes Mother    Colon cancer Mother 34       rectal cancer   Hypertension Mother    Hypertension Sister    Diabetes Sister    Heart attack Neg Hx    Stroke Neg Hx    Colon polyps Neg Hx    Esophageal cancer Neg Hx    Stomach cancer Neg Hx     Prior to Admission medications  Medication Sig Start Date End Date Taking? Authorizing Provider  allopurinol  (ZYLOPRIM ) 300 MG tablet Take 300 mg by mouth daily.    [provider]  aspirin  EC 81 MG tablet Take 1 tablet (81 mg total) by mouth daily. Swallow whole. 01/19/24   Vannie Reche RAMAN, NP  atorvastatin  (LIPITOR) 20 MG tablet Take 1 tablet (20 mg total) by mouth daily. 07/26/23   Samtani, Jai-Gurmukh, MD  cyanocobalamin  (VITAMIN B12) 1000 MCG/ML injection Inject 1 mL into the muscle every 30 (thirty) days. 03/13/23   [provider]  fish oil-omega-3 fatty acids 1000 MG capsule Take 1 g by mouth 2 (two) times daily.    [provider]  furosemide  (LASIX ) 20 MG tablet Take 1 tablet (20 mg total) by mouth daily. 01/19/24 06/17/24  Walker, Caitlin S, NP  latanoprost  (XALATAN ) 0.005 % ophthalmic solution Place 1 drop into both eyes at bedtime.    [provider]  metFORMIN (GLUCOPHAGE) 1000 MG tablet Take 1,000 mg by mouth in the morning and at bedtime.    [provider]  metoprolol  succinate (TOPROL -XL) 100 MG 24 hr tablet Take 1 tablet (100 mg total) by mouth daily. Take with or immediately following a meal.  07/27/23   Samtani, Jai-Gurmukh, MD  Multiple Vitamin (MULTI-VITAMIN) tablet Take 1 tablet by mouth daily. Patient taking differently: Take 1 tablet by mouth 2 (two) times daily.    [provider]  nitroGLYCERIN  (NITROSTAT ) 0.4 MG SL tablet Place 1 tablet under the tongue as needed. 09/01/16   [provider]  pantoprazole  (PROTONIX ) 40 MG tablet Take 40 mg by mouth 2 (two) times daily. 01/08/24   [provider]    Physical Exam: Vitals:   08/14/24 2043  08/14/24 2101  BP:  125/76  Pulse:  (!) 104  Resp:  19  Temp: 98.2 F (36.8 C)   TempSrc: Oral   SpO2:  100%   Constitutional: NAD, calm, comfortable Eyes: PERRL, lids and conjunctivae normal ENMT: Mucous membranes are moist.  Neck: normal, supple Respiratory: clear to auscultation bilaterally, no wheezing, no crackles. Normal respiratory effort. No accessory muscle use.  Cardiovascular: Regular rate and rhythm, no murmurs / rubs / gallops. No extremity edema. 2+ pedal pulses.  Abdomen: no tenderness, no masses palpated. Bowel sounds positive.  Musculoskeletal: no clubbing / cyanosis. Normal muscle tone.  Skin: no rashes, lesions, ulcers. No induration Neurologic: non focal   Labs on Admission: I have personally reviewed following labs and imaging studies  CBC: Recent Labs  Lab 08/14/24 2100  WBC 13.2*  NEUTROABS 11.4*  HGB 11.1*  HCT 34.5*  MCV 94.8  PLT 169   Basic Metabolic Panel: Recent Labs  Lab 08/14/24 2215  NA 133*  K 5.2*  CL 99  CO2 23  GLUCOSE 200*  BUN 52*  CREATININE 1.88*  CALCIUM  8.6*   Liver Function Tests: Recent Labs  Lab 08/14/24 2215  AST 817*  ALT 976*  ALKPHOS 190*  BILITOT 1.7*  PROT 5.9*  ALBUMIN 3.3*   Coagulation Profile: Recent Labs  Lab 08/14/24 2215  INR 1.3*   BNP (last 3 results) Recent Labs    01/26/24 1404 02/27/24 1034  PROBNP 33,710* 4,187*   CBG: No results for input(s): GLUCAP in the last 168 hours. Thyroid  Function  Tests: No results for input(s): TSH, T4TOTAL, FREET4, T3FREE, THYROIDAB in the last 72 hours. Urine analysis:    Component Value Date/Time   COLORURINE YELLOW 08/14/2024 2108   APPEARANCEUR CLEAR 08/14/2024 2108   LABSPEC 1.014 08/14/2024 2108   PHURINE 5.0 08/14/2024 2108   GLUCOSEU NEGATIVE 08/14/2024 2108   HGBUR NEGATIVE 08/14/2024 2108   BILIRUBINUR NEGATIVE 08/14/2024 2108   KETONESUR NEGATIVE 08/14/2024 2108   PROTEINUR 100 (A) 08/14/2024 2108   UROBILINOGEN 0.2 10/16/2008 1546   NITRITE NEGATIVE 08/14/2024 2108   LEUKOCYTESUR NEGATIVE 08/14/2024 2108     Radiological Exams on Admission: DG Chest Port 1 View Result Date: 08/14/2024 EXAM: 1 VIEW(S) XRAY OF THE CHEST 08/14/2024 09:18:00 PM COMPARISON: Comparison with 01/16/2024. CLINICAL HISTORY: Questionable sepsis - evaluate for abnormality. FINDINGS: LUNGS AND PLEURA: Low lung volumes accentuate pulmonary vascularity. No focal pulmonary opacity. No pleural effusion. No pneumothorax. HEART AND MEDIASTINUM: Cardiomegaly. Sternotomy and CABG. BONES AND SOFT TISSUES: Remote left rib fracture. IMPRESSION: 1. No acute cardiopulmonary abnormality. 2. Cardiomegaly. Electronically signed by: Norman Gatlin MD 08/14/2024 09:21 PM EST RP Workstation: [id number]    EKG: Independently reviewed.  Sinus tachycardia  Assessment/Plan Principal problem SIRS, concern for acute on chronic cholecystitis -patient presented to the hospital with nausea, vomiting of sudden onset, found to have possible fever at home, tachycardia, leukocytosis and significantly LFT elevation - CT scan of the abdomen and pelvis as above, gallbladder could not really be identified in the fossa but there are some concerns for gastric fistula with this area.  His abdomen is soft, nontender, he has no guarding.  General surgery called, I spoke with Dr. Vanderbilt for consult - He also has what looks like choledocholithiasis, GI messaged for am consultation. - Will  place on broad-spectrum antibiotics with Zosyn , cultures were obtained, give IV fluids  Active problems Elevated LFTs -due to #1  CAD status post CABG -hold aspirin  and statin for now with potential  procedures upcoming and elevated LFTs  Hyperkalemia -borderline, will monitor, started on fluids  Chronic kidney disease stage IV -baseline creatinine in 2025 ranging between 1.5 and 2.2, currently close to his baseline  Mild dementia - fully functional at home however in the hospital setting he has been having delirium in the past. Patient thinks alprazolam  helps, will trial los dose.  Chronic systolic CHF -most recent 2D echo about a year ago showing LVEF 35 to 40%, normal RV.  For now hold furosemide , continue lower dose metoprolol .  He is euvolemic  Essential hypertension -continue lower dose metoprolol  given soft blood pressures  PAF -continue lower dose metoprolol .  Does not appear to be on anticoagulation and he was being considered for Watchman for LAA closure.  He was not felt to be a good long-term anticoagulation candidate due to history of spontaneous intramuscular hematoma and GI bleeding  Hyperlipidemia -hold statin   DVT prophylaxis: Heparin  Code Status: DNR  Family Communication: Wife at bedside Bed Type: Progressive Consults called: General surgery, messaged Dr. Charlanne via secure chat  At the time of admission, it appears that the appropriate admission status for this patient is INPATIENT as it is expected that patient will require hospital care > 2 midnights. This is judged to be reasonable and necessary in order to provide the required intensity of service to ensure the patient's safety given: presenting symptoms, initial radiographic and laboratory data and in the context of their chronic comorbidities. Together, these circumstances are felt to place patient at high at high risk for further clinical deterioration threatening life, limb, or organ.  Nilda Fendt, MD,  PhD [facility]  Contact via [email or web address]  08/14/2024, 11:32 PM         [1]  Allergies Allergen Reactions   Shellfish Protein-Containing Drug Products Anaphylaxis   Nsaids Other (See Comments)    H/o gastric ulcers = NO NSAIDs per GI   Morphine  Other (See Comments)    Hallucinations    Zolpidem Tartrate Other (See Comments)    Hallucinations   "

## 2024-08-15 ENCOUNTER — Inpatient Hospital Stay (HOSPITAL_COMMUNITY)

## 2024-08-15 DIAGNOSIS — D696 Thrombocytopenia, unspecified: Secondary | ICD-10-CM

## 2024-08-15 DIAGNOSIS — K8050 Calculus of bile duct without cholangitis or cholecystitis without obstruction: Secondary | ICD-10-CM | POA: Diagnosis present

## 2024-08-15 DIAGNOSIS — D500 Iron deficiency anemia secondary to blood loss (chronic): Secondary | ICD-10-CM

## 2024-08-15 DIAGNOSIS — I480 Paroxysmal atrial fibrillation: Secondary | ICD-10-CM

## 2024-08-15 DIAGNOSIS — I5020 Unspecified systolic (congestive) heart failure: Secondary | ICD-10-CM | POA: Diagnosis not present

## 2024-08-15 DIAGNOSIS — I2510 Atherosclerotic heart disease of native coronary artery without angina pectoris: Secondary | ICD-10-CM

## 2024-08-15 DIAGNOSIS — Z951 Presence of aortocoronary bypass graft: Secondary | ICD-10-CM

## 2024-08-15 DIAGNOSIS — N184 Chronic kidney disease, stage 4 (severe): Secondary | ICD-10-CM | POA: Diagnosis not present

## 2024-08-15 DIAGNOSIS — J849 Interstitial pulmonary disease, unspecified: Secondary | ICD-10-CM | POA: Diagnosis not present

## 2024-08-15 LAB — COMPREHENSIVE METABOLIC PANEL WITH GFR
ALT: 951 U/L — ABNORMAL HIGH (ref 0–44)
AST: 692 U/L — ABNORMAL HIGH (ref 15–41)
Albumin: 3.8 g/dL (ref 3.5–5.0)
Alkaline Phosphatase: 216 U/L — ABNORMAL HIGH (ref 38–126)
Anion gap: 15 (ref 5–15)
BUN: 48 mg/dL — ABNORMAL HIGH (ref 8–23)
CO2: 22 mmol/L (ref 22–32)
Calcium: 9.4 mg/dL (ref 8.9–10.3)
Chloride: 100 mmol/L (ref 98–111)
Creatinine, Ser: 1.91 mg/dL — ABNORMAL HIGH (ref 0.61–1.24)
GFR, Estimated: 35 mL/min — ABNORMAL LOW
Glucose, Bld: 122 mg/dL — ABNORMAL HIGH (ref 70–99)
Potassium: 4.4 mmol/L (ref 3.5–5.1)
Sodium: 137 mmol/L (ref 135–145)
Total Bilirubin: 1.9 mg/dL — ABNORMAL HIGH (ref 0.0–1.2)
Total Protein: 6.5 g/dL (ref 6.5–8.1)

## 2024-08-15 LAB — BLOOD CULTURE ID PANEL (REFLEXED) - BCID2
A.calcoaceticus-baumannii: NOT DETECTED
Bacteroides fragilis: NOT DETECTED
CTX-M ESBL: DETECTED — AB
Candida albicans: NOT DETECTED
Candida auris: NOT DETECTED
Candida glabrata: NOT DETECTED
Candida krusei: NOT DETECTED
Candida parapsilosis: NOT DETECTED
Candida tropicalis: NOT DETECTED
Carbapenem resist OXA 48 LIKE: NOT DETECTED
Carbapenem resistance IMP: NOT DETECTED
Carbapenem resistance KPC: NOT DETECTED
Carbapenem resistance NDM: NOT DETECTED
Carbapenem resistance VIM: NOT DETECTED
Cryptococcus neoformans/gattii: NOT DETECTED
Enterobacter cloacae complex: NOT DETECTED
Enterobacterales: DETECTED — AB
Enterococcus Faecium: NOT DETECTED
Enterococcus faecalis: NOT DETECTED
Escherichia coli: DETECTED — AB
Haemophilus influenzae: NOT DETECTED
Klebsiella aerogenes: NOT DETECTED
Klebsiella oxytoca: NOT DETECTED
Klebsiella pneumoniae: DETECTED — AB
Listeria monocytogenes: NOT DETECTED
Neisseria meningitidis: NOT DETECTED
Proteus species: NOT DETECTED
Pseudomonas aeruginosa: NOT DETECTED
Salmonella species: NOT DETECTED
Serratia marcescens: NOT DETECTED
Staphylococcus aureus (BCID): NOT DETECTED
Staphylococcus epidermidis: NOT DETECTED
Staphylococcus lugdunensis: NOT DETECTED
Staphylococcus species: NOT DETECTED
Stenotrophomonas maltophilia: NOT DETECTED
Streptococcus agalactiae: NOT DETECTED
Streptococcus pneumoniae: NOT DETECTED
Streptococcus pyogenes: NOT DETECTED
Streptococcus species: NOT DETECTED

## 2024-08-15 LAB — CULTURE, BLOOD (ROUTINE X 2)

## 2024-08-15 LAB — CBC
HCT: 31.3 % — ABNORMAL LOW (ref 39.0–52.0)
Hemoglobin: 10.2 g/dL — ABNORMAL LOW (ref 13.0–17.0)
MCH: 30.7 pg (ref 26.0–34.0)
MCHC: 32.6 g/dL (ref 30.0–36.0)
MCV: 94.3 fL (ref 80.0–100.0)
Platelets: 126 10*3/uL — ABNORMAL LOW (ref 150–400)
RBC: 3.32 MIL/uL — ABNORMAL LOW (ref 4.22–5.81)
RDW: 18 % — ABNORMAL HIGH (ref 11.5–15.5)
WBC: 9.5 10*3/uL (ref 4.0–10.5)
nRBC: 0 % (ref 0.0–0.2)

## 2024-08-15 LAB — RESP PANEL BY RT-PCR (RSV, FLU A&B, COVID)  RVPGX2
Influenza A by PCR: NEGATIVE
Influenza B by PCR: NEGATIVE
Resp Syncytial Virus by PCR: NEGATIVE
SARS Coronavirus 2 by RT PCR: NEGATIVE

## 2024-08-15 LAB — I-STAT CG4 LACTIC ACID, ED: Lactic Acid, Venous: 1.8 mmol/L (ref 0.5–1.9)

## 2024-08-15 LAB — MAGNESIUM: Magnesium: 1.8 mg/dL (ref 1.7–2.4)

## 2024-08-15 MED ORDER — ONDANSETRON HCL 4 MG PO TABS: 4.0000 mg | ORAL_TABLET | Freq: Four times a day (QID) | ORAL | Status: AC | PRN

## 2024-08-15 MED ORDER — PANTOPRAZOLE SODIUM 40 MG PO TBEC
40.0000 mg | DELAYED_RELEASE_TABLET | Freq: Two times a day (BID) | ORAL | Status: AC
  Administered 2024-08-15 – 2024-08-16 (×2): 40 mg via ORAL
  Filled 2024-08-15 (×2): qty 1

## 2024-08-15 MED ORDER — METOPROLOL SUCCINATE ER 25 MG PO TB24
25.0000 mg | ORAL_TABLET | Freq: Every day | ORAL | Status: AC
  Administered 2024-08-16: 25 mg via ORAL
  Filled 2024-08-15: qty 1

## 2024-08-15 MED ORDER — LATANOPROST 0.005 % OP SOLN
1.0000 [drp] | Freq: Every day | OPHTHALMIC | Status: AC
  Administered 2024-08-15 – 2024-08-16 (×2): 1 [drp] via OPHTHALMIC
  Filled 2024-08-15: qty 2.5

## 2024-08-15 MED ORDER — SODIUM CHLORIDE 0.9 % IV SOLN: INTRAVENOUS | Status: DC

## 2024-08-15 MED ORDER — SODIUM CHLORIDE 0.9 % IV SOLN
1.0000 g | Freq: Two times a day (BID) | INTRAVENOUS | Status: AC
  Administered 2024-08-15 – 2024-08-16 (×4): 1 g via INTRAVENOUS
  Filled 2024-08-15 (×5): qty 20

## 2024-08-15 MED ORDER — ALPRAZOLAM 0.5 MG PO TABS
0.5000 mg | ORAL_TABLET | Freq: Every evening | ORAL | Status: AC | PRN
  Administered 2024-08-15: 0.5 mg via ORAL
  Filled 2024-08-15: qty 1

## 2024-08-15 MED ORDER — ACETAMINOPHEN 650 MG RE SUPP: 650.0000 mg | Freq: Four times a day (QID) | RECTAL | Status: AC | PRN

## 2024-08-15 MED ORDER — ACETAMINOPHEN 325 MG PO TABS
650.0000 mg | ORAL_TABLET | Freq: Four times a day (QID) | ORAL | Status: AC | PRN
  Administered 2024-08-15: 650 mg via ORAL
  Filled 2024-08-15: qty 2

## 2024-08-15 MED ORDER — LORAZEPAM 2 MG/ML IJ SOLN
1.0000 mg | Freq: Once | INTRAMUSCULAR | Status: AC
  Administered 2024-08-15: 2 mg via INTRAVENOUS
  Filled 2024-08-15: qty 1

## 2024-08-15 MED ORDER — ONDANSETRON HCL 4 MG/2ML IJ SOLN: 4.0000 mg | Freq: Four times a day (QID) | INTRAMUSCULAR | Status: AC | PRN

## 2024-08-15 MED ORDER — LACTATED RINGERS IV BOLUS
1000.0000 mL | Freq: Once | INTRAVENOUS | Status: AC
  Administered 2024-08-15: 1000 mL via INTRAVENOUS

## 2024-08-15 MED ORDER — HEPARIN SODIUM (PORCINE) 5000 UNIT/ML IJ SOLN: 5000.0000 [IU] | Freq: Three times a day (TID) | INTRAMUSCULAR | Status: AC

## 2024-08-15 MED ORDER — HEPARIN SODIUM (PORCINE) 5000 UNIT/ML IJ SOLN
5000.0000 [IU] | Freq: Three times a day (TID) | INTRAMUSCULAR | Status: AC
  Administered 2024-08-15 (×2): 5000 [IU] via SUBCUTANEOUS
  Filled 2024-08-15 (×2): qty 1

## 2024-08-15 MED ORDER — LACTATED RINGERS IV SOLN: INTRAVENOUS | Status: AC

## 2024-08-15 MED ORDER — PIPERACILLIN-TAZOBACTAM 3.375 G IVPB
3.3750 g | Freq: Three times a day (TID) | INTRAVENOUS | Status: DC
  Administered 2024-08-15: 3.375 g via INTRAVENOUS
  Filled 2024-08-15: qty 50

## 2024-08-15 NOTE — Consult Note (Signed)
 "    Kingsten Enfield [birth year]  [id number].    Requesting MD: Dr. Elgie Butter Chief Complaint/Reason for Consult: Choledocholithiasis  HPI: Terran Klinke is a 79 y.o. male w/ a hx of CAD s/p CABG, DM2, OSA, aortic stenosis s/p AVR, CHF (EF 35-40% ON ECHO 07/15/2023), CKD4 and dementia who presented to the ED for n/v.   Patient is A&O x 4 however reports he does not remember having any abdominal pain, n/v yesterday or today prompting presentation. No current abdominal pain, n/v. He is unsure of last BM. Voiding. No family at bedside.   He has hx of prior percutaneous cholecystostomy tube for cholecystitis in 2024. He was in the hospital in 2025 and found to have delayed gallbladder filling compatible with chronic calculus cholecystitis on HIDA. Previously seen by Dr. Rubin in the office for his GB in 2025 and felt Surgery should be avoided if possible due to high cardiac risk; consider cholecystostomy tube replacement if acute cholecystitis occurs.  Workup revealed fever to 100.4 and tachycardia. No hypotension at the time of seeing the patient. WBC 13.2 --> 9.5. Lactic cleared. Alk Phos 216 (190), AST 692 (817), ALT 951 (976), T. Bili 1.9 (1.7). CT A/P w/ possible gastric fistula to GB and Choledocholithiasis w/ minimal pneumobilia. He is currently on abx. Admitted to [facility]. General surgery was asked to see.   He is on a baby asa daily. Otherwise no blood thinners. Remote history of prior umbilical hernia repair.   ROS: ROS As above, see hpi  Family History  Problem Relation Age of Onset   Cirrhosis Father    Alcohol  abuse Father        father murdered   Rectal cancer Mother    Diabetes Mother    Colon cancer Mother 53       rectal cancer   Hypertension Mother    Hypertension Sister    Diabetes Sister    Heart attack Neg Hx    Stroke Neg Hx    Colon polyps Neg Hx    Esophageal cancer Neg Hx    Stomach cancer Neg Hx     Past Medical History:  Diagnosis Date   Allergy     Aortic stenosis    Arthritis    Balance problem 04/26/2022   Blood transfusion without reported diagnosis    CAD (coronary artery disease)    Cataract    both eyes surgically removed   Diabetes mellitus, type 2 (HCC)    Diverticulosis of colon (without mention of hemorrhage)    Duodenal ulcer perforation (HCC)    blood transfusion   Flesh-eating bacteria (HCC) 1995   GERD (gastroesophageal reflux disease)    Glaucoma    pre glaucoma on Xalatan  drops   Heart murmur    History of diabetic retinopathy    History of gastrointestinal bleeding    History of necrotizing fasciitis    History of prosthetic aortic valve    Hypercholesteremia    Melanoma (HCC)    Myocardial infarction (HCC) 02/23/2016   Neuropathic pain 04/26/2022   Nightmares    CHRONIC   OSA (obstructive sleep apnea)    borderline no CPAP    Personal history of colonic polyps 11/23/2009   TUBULAR ADENOMA   Postoperative anemia    Sinus bradycardia    Sleep apnea    no cpap   Stomach ulcer    Hx of   Systolic hypertension    Vitamin B12 deficiency     Past Surgical History:  Procedure Laterality Date   AORTIC VALVE REPLACEMENT     BLEPHAROPLASTY  11/24/2014   CARDIAC CATHETERIZATION N/A 03/01/2016   Procedure: Coronary/Graft Angiography;  Surgeon: Peter M Jordan, MD;  Location: [facility] INVASIVE CV LAB;  Service: Cardiovascular;  Laterality: N/A;   CATARACT EXTRACTION W/ INTRAOCULAR LENS IMPLANT     OD   COLONOSCOPY     CORONARY ARTERY BYPASS GRAFT  2010   x 4   flesh eating disease surgery      surgeries x 7    HERNIA REPAIR  1972   IR EXCHANGE BILIARY DRAIN  03/07/2023   IR EXCHANGE BILIARY DRAIN  04/18/2023   IR PERC CHOLECYSTOSTOMY  03/02/2023   POLYPECTOMY     RETINAL LASER PROCEDURE     SCROTAL SURGERY  1995   resection   UMBILICAL HERNIA REPAIR N/A 04/10/2013   open UHR w Ventralex mesh patch - Dr Gladis    Social History:  reports that he has never smoked. He has never used smokeless tobacco.  He reports that he does not drink alcohol  and does not use drugs.  Allergies: Allergies[1]  (Not in a hospital admission)    Physical Exam: Blood pressure 115/70, pulse (!) 123, temperature (!) 100.4 F (38 C), resp. rate 20, weight 58.3 kg, SpO2 95%. General: pleasant, WD/WN male who is laying in bed in NAD HEENT: head is normocephalic, atraumatic.  Sclera are non-icteric.  Heart: regular rate Lungs:  Respiratory effort nonlabored Abd: Soft, ND, NT.  Skin: warm and dry  Psych: A&Ox4. Appears confused at times.  Neuro: normal speech, thought process intact, gait not assessed  Results for orders placed or performed during the hospital encounter of 08/14/24 (from the past 48 hours)  CBC with Differential     Status: Abnormal   Collection Time: 08/14/24  9:00 PM  Result Value Ref Range   WBC 13.2 (H) 4.0 - 10.5 K/uL   RBC 3.64 (L) 4.22 - 5.81 MIL/uL   Hemoglobin 11.1 (L) 13.0 - 17.0 g/dL   HCT 65.4 (L) 60.9 - 47.9 %   MCV 94.8 80.0 - 100.0 fL   MCH 30.5 26.0 - 34.0 pg   MCHC 32.2 30.0 - 36.0 g/dL   RDW 82.2 (H) 88.4 - 84.4 %   Platelets 169 150 - 400 K/uL   nRBC 0.0 0.0 - 0.2 %   Neutrophils Relative % 86 %   Neutro Abs 11.4 (H) 1.7 - 7.7 K/uL   Lymphocytes Relative 5 %   Lymphs Abs 0.7 0.7 - 4.0 K/uL   Monocytes Relative 8 %   Monocytes Absolute 1.0 0.1 - 1.0 K/uL   Eosinophils Relative 0 %   Eosinophils Absolute 0.0 0.0 - 0.5 K/uL   Basophils Relative 0 %   Basophils Absolute 0.0 0.0 - 0.1 K/uL   Immature Granulocytes 1 %   Abs Immature Granulocytes 0.08 (H) 0.00 - 0.07 K/uL    Comment: Performed at [facility], [address] [place], [place] [address]  Urinalysis, w/ Reflex to Culture (Infection Suspected) -Urine, Clean Catch     Status: Abnormal   Collection Time: 08/14/24  9:08 PM  Result Value Ref Range   Specimen Source URINE, CLEAN CATCH    Color, Urine YELLOW YELLOW   APPearance CLEAR CLEAR   Specific Gravity, Urine 1.014 1.005 - 1.030    pH 5.0 5.0 - 8.0   Glucose, UA NEGATIVE NEGATIVE mg/dL   Hgb urine dipstick NEGATIVE NEGATIVE   Bilirubin Urine NEGATIVE  NEGATIVE   Ketones, ur NEGATIVE NEGATIVE mg/dL   Protein, ur 899 (A) NEGATIVE mg/dL   Nitrite NEGATIVE NEGATIVE   Leukocytes,Ua NEGATIVE NEGATIVE   RBC / HPF 0-5 0 - 5 RBC/hpf   WBC, UA 6-10 0 - 5 WBC/hpf    Comment:        Reflex urine culture not performed if WBC <=10, OR if Squamous epithelial cells >5. If Squamous epithelial cells >5 suggest recollection.    Bacteria, UA NONE SEEN NONE SEEN   Squamous Epithelial / HPF 0-5 0 - 5 /HPF   Mucus PRESENT     Comment: Performed at [facility], [address], [place], [place] [address]  I-Stat Lactic Acid, ED     Status: Abnormal   Collection Time: 08/14/24  9:15 PM  Result Value Ref Range   Lactic Acid, Venous 2.9 (HH) 0.5 - 1.9 mmol/L   Comment NOTIFIED PHYSICIAN   Comprehensive metabolic panel with GFR     Status: Abnormal   Collection Time: 08/14/24 10:15 PM  Result Value Ref Range   Sodium 133 (L) 135 - 145 mmol/L   Potassium 5.2 (H) 3.5 - 5.1 mmol/L    Comment: HEMOLYSIS AT THIS LEVEL MAY AFFECT RESULT   Chloride 99 98 - 111 mmol/L   CO2 23 22 - 32 mmol/L   Glucose, Bld 200 (H) 70 - 99 mg/dL    Comment: Glucose reference range applies only to samples taken after fasting for at least 8 hours.   BUN 52 (H) 8 - 23 mg/dL   Creatinine, Ser 8.11 (H) 0.61 - 1.24 mg/dL   Calcium  8.6 (L) 8.9 - 10.3 mg/dL   Total Protein 5.9 (L) 6.5 - 8.1 g/dL   Albumin 3.3 (L) 3.5 - 5.0 g/dL   AST 182 (H) 15 - 41 U/L    Comment: HEMOLYSIS AT THIS LEVEL MAY AFFECT RESULT   ALT 976 (H) 0 - 44 U/L   Alkaline Phosphatase 190 (H) 38 - 126 U/L   Total Bilirubin 1.7 (H) 0.0 - 1.2 mg/dL   GFR, Estimated 36 (L) >60 mL/min    Comment: (NOTE) Calculated using the CKD-EPI Creatinine Equation (2021)    Anion gap 11 5 - 15    Comment: Performed at [facility], [address],  [place], [place] [address]  Lipase, blood     Status: Abnormal   Collection Time: 08/14/24 10:15 PM  Result Value Ref Range   Lipase 52 (H) 11 - 51 U/L    Comment: Performed at [facility], [address], [place], [place] [address]  Protime-INR     Status: Abnormal   Collection Time: 08/14/24 10:15 PM  Result Value Ref Range   Prothrombin Time 16.4 (H) 11.4 - 15.2 seconds   INR 1.3 (H) 0.8 - 1.2    Comment: (NOTE) INR goal varies based on device and disease states. Performed at [facility], [address], [place], [place] [address]   Resp panel by RT-PCR (RSV, Flu A&B, Covid) Anterior Nasal Swab     Status: None   Collection Time: 08/15/24 12:07 AM   Specimen: Anterior Nasal Swab  Result Value Ref Range   SARS Coronavirus 2 by RT PCR NEGATIVE NEGATIVE    Comment: (NOTE) SARS-CoV-2 target nucleic acids are NOT DETECTED.  The SARS-CoV-2 RNA is generally detectable in upper respiratory specimens during the acute phase of infection. The lowest concentration of SARS-CoV-2 viral copies this assay can detect  is 138 copies/mL. A negative result does not preclude SARS-Cov-2 infection and should not be used as the sole basis for treatment or other patient management decisions. A negative result may occur with  improper specimen collection/handling, submission of specimen other than nasopharyngeal swab, presence of viral mutation(s) within the areas targeted by this assay, and inadequate number of viral copies(<138 copies/mL). A negative result must be combined with clinical observations, patient history, and epidemiological information. The expected result is Negative.  Fact Sheet for Patients:  bloggercourse.com  Fact Sheet for Healthcare Providers:  seriousbroker.it  This test is no t yet approved or cleared by the [place]  FDA and  has been authorized for detection and/or diagnosis of  SARS-CoV-2 by FDA under an Emergency Use Authorization (EUA). This EUA will remain  in effect (meaning this test can be used) for the duration of the COVID-19 declaration under Section 564(b)(1) of the Act, 21 U.S.C.section 360bbb-3(b)(1), unless the authorization is terminated  or revoked sooner.       Influenza A by PCR NEGATIVE NEGATIVE   Influenza B by PCR NEGATIVE NEGATIVE    Comment: (NOTE) The Xpert Xpress SARS-CoV-2/FLU/RSV plus assay is intended as an aid in the diagnosis of influenza from Nasopharyngeal swab specimens and should not be used as a sole basis for treatment. Nasal washings and aspirates are unacceptable for Xpert Xpress SARS-CoV-2/FLU/RSV testing.  Fact Sheet for Patients: bloggercourse.com  Fact Sheet for Healthcare Providers: seriousbroker.it  This test is not yet approved or cleared by the [place]  FDA and has been authorized for detection and/or diagnosis of SARS-CoV-2 by FDA under an Emergency Use Authorization (EUA). This EUA will remain in effect (meaning this test can be used) for the duration of the COVID-19 declaration under Section 564(b)(1) of the Act, 21 U.S.C. section 360bbb-3(b)(1), unless the authorization is terminated or revoked.     Resp Syncytial Virus by PCR NEGATIVE NEGATIVE    Comment: (NOTE) Fact Sheet for Patients: bloggercourse.com  Fact Sheet for Healthcare Providers: seriousbroker.it  This test is not yet approved or cleared by the [place]  FDA and has been authorized for detection and/or diagnosis of SARS-CoV-2 by FDA under an Emergency Use Authorization (EUA). This EUA will remain in effect (meaning this test can be used) for the duration of the COVID-19 declaration under Section 564(b)(1) of the Act, 21 U.S.C. section 360bbb-3(b)(1), unless the authorization is terminated or revoked.  Performed at [facility], [address], [place], [place] [address]   CBC     Status: Abnormal   Collection Time: 08/15/24  3:15 AM  Result Value Ref Range   WBC 9.5 4.0 - 10.5 K/uL   RBC 3.32 (L) 4.22 - 5.81 MIL/uL   Hemoglobin 10.2 (L) 13.0 - 17.0 g/dL   HCT 68.6 (L) 60.9 - 47.9 %   MCV 94.3 80.0 - 100.0 fL   MCH 30.7 26.0 - 34.0 pg   MCHC 32.6 30.0 - 36.0 g/dL   RDW 81.9 (H) 88.4 - 84.4 %   Platelets 126 (L) 150 - 400 K/uL   nRBC 0.0 0.0 - 0.2 %    Comment: Performed at [facility], [address] [place], [place] [address]  I-Stat Lactic Acid, ED     Status: None   Collection Time: 08/15/24  3:31 AM  Result Value Ref Range   Lactic Acid, Venous 1.8 0.5 - 1.9 mmol/L  Comprehensive metabolic panel with GFR     Status: Abnormal  Collection Time: 08/15/24  4:10 AM  Result Value Ref Range   Sodium 137 135 - 145 mmol/L   Potassium 4.4 3.5 - 5.1 mmol/L   Chloride 100 98 - 111 mmol/L   CO2 22 22 - 32 mmol/L   Glucose, Bld 122 (H) 70 - 99 mg/dL    Comment: Glucose reference range applies only to samples taken after fasting for at least 8 hours.   BUN 48 (H) 8 - 23 mg/dL   Creatinine, Ser 8.08 (H) 0.61 - 1.24 mg/dL   Calcium  9.4 8.9 - 10.3 mg/dL   Total Protein 6.5 6.5 - 8.1 g/dL   Albumin 3.8 3.5 - 5.0 g/dL   AST 307 (H) 15 - 41 U/L   ALT 951 (H) 0 - 44 U/L   Alkaline Phosphatase 216 (H) 38 - 126 U/L   Total Bilirubin 1.9 (H) 0.0 - 1.2 mg/dL   GFR, Estimated 35 (L) >60 mL/min    Comment: (NOTE) Calculated using the CKD-EPI Creatinine Equation (2021)    Anion gap 15 5 - 15    Comment: Performed at [facility], [address], [place], [place] [address]  Magnesium      Status: None   Collection Time: 08/15/24  4:10 AM  Result Value Ref Range   Magnesium  1.8 1.7 - 2.4 mg/dL    Comment: Performed at [facility], [address] [place], [place] [address]   CT ABDOMEN PELVIS W CONTRAST Result Date: 08/14/2024 CLINICAL  DATA:  Acute abdominal pain.  Weakness. EXAM: CT ABDOMEN AND PELVIS WITH CONTRAST TECHNIQUE: Multidetector CT imaging of the abdomen and pelvis was performed using the standard protocol following bolus administration of intravenous contrast. RADIATION DOSE REDUCTION: This exam was performed according to the departmental dose-optimization program which includes automated exposure control, adjustment of the mA and/or kV according to patient size and/or use of iterative reconstruction technique. CONTRAST:  80mL OMNIPAQUE  IOHEXOL  300 MG/ML  SOLN COMPARISON:  CT 01/16/2024, report from PET CT 06/05/2024 from an outside institution reviewed, images not available FINDINGS: Lower chest: Chronic interstitial lung disease. The heart is enlarged. Coronary artery calcifications. Hepatobiliary: Mild heterogeneous appearance of the liver. No discrete focal lesion. Fluid-filled gallbladder is not seen. Foci of gas and high-density material in the expected location of the gallbladder/gallbladder fossa, series 2, image 30, also described on prior PET. The distal stomach calms in contact with this abnormality. There are numerous noncalcified gallstones in the distal common bile duct, distally stone or contiguous stones spans 19 mm series 8, image 72. The common bile duct is dilated at 13 mm. Minimal pneumobilia. Mild central intrahepatic biliary ductal dilatation. Pancreas: No ductal dilatation or inflammation. Spleen: Calcified granuloma in the spleen. Adrenals/Urinary Tract: No adrenal nodule. No hydronephrosis. Bilateral renal cysts. No further follow-up imaging is recommended. No renal calculi. Nondistended urinary bladder with mild diffuse bladder wall thickening. No perivesicular inflammation. Stomach/Bowel: The stomach is nondistended and not well evaluated. Distal stomach abuts abnormal appearance of the gallbladder/gallbladder fossa. Fecalization of distal small bowel contents. No obstruction. High-riding cecum in the mid  abdomen. The appendix is normal. Moderate volume of colonic stool. Colonic diverticulosis without focal diverticulitis. Small bowel in the left abdomen projects lateral to the descending colon, but no wall thickening or inflammatory change. Similar findings were seen previously. Vascular/Lymphatic: Aortic atherosclerosis. No aortic aneurysm. Patent portal vein. No suspicious lymphadenopathy. Reproductive: Prominent prostate. Other: No free air or ascites. Suspect prior umbilical hernia repair. Musculoskeletal: Chronic bilateral L5 pars  interarticularis defects with grade 1 anterolisthesis of L5 on S1. Scattered Schmorl's nodes in the spine. There are no acute or suspicious osseous abnormalities. IMPRESSION: 1. Fluid-filled gallbladder is not seen. Foci of gas and high-density material in the expected location of the gallbladder/gallbladder fossa. Recommend correlation for history of cholecystectomy. Similar findings were described on prior PET. Findings may be postsurgical in etiology,, but are not well-defined by CT. The distal stomach approaches this region of abnormality, and a gastric fistula is also considered. 2. Numerous noncalcified gallstones in the distal common bile duct, distally stone or contiguous stones spans 19 mm. The common bile duct is dilated at 13 mm. Minimal pneumobilia, likely postprocedural. Mild central intrahepatic biliary ductal dilatation. 3. Colonic diverticulosis without diverticulitis. 4. Chronic interstitial lung disease. Aortic Atherosclerosis (ICD10-I70.0). Electronically Signed   By: Andrea Gasman M.D.   On: 08/14/2024 23:50   DG Chest Port 1 View Result Date: 08/14/2024 EXAM: 1 VIEW(S) XRAY OF THE CHEST 08/14/2024 09:18:00 PM COMPARISON: Comparison with 01/16/2024. CLINICAL HISTORY: Questionable sepsis - evaluate for abnormality. FINDINGS: LUNGS AND PLEURA: Low lung volumes accentuate pulmonary vascularity. No focal pulmonary opacity. No pleural effusion. No pneumothorax.  HEART AND MEDIASTINUM: Cardiomegaly. Sternotomy and CABG. BONES AND SOFT TISSUES: Remote left rib fracture. IMPRESSION: 1. No acute cardiopulmonary abnormality. 2. Cardiomegaly. Electronically signed by: Norman Gatlin MD 08/14/2024 09:21 PM EST RP Workstation: [id number]    Anti-infectives (From admission, onward)    Start     Dose/Rate Route Frequency Ordered Stop   08/15/24 0800  piperacillin -tazobactam (ZOSYN ) IVPB 3.375 g        3.375 g 12.5 mL/hr over 240 Minutes Intravenous Every 8 hours 08/15/24 0049     08/14/24 2315  cefTRIAXone  (ROCEPHIN ) 2 g in sodium chloride  0.9 % 100 mL IVPB        2 g 200 mL/hr over 30 Minutes Intravenous  Once 08/14/24 2303 08/15/24 0045   08/14/24 2315  metroNIDAZOLE  (FLAGYL ) IVPB 500 mg        500 mg 100 mL/hr over 60 Minutes Intravenous  Once 08/14/24 2303 08/15/24 0116       Assessment/Plan Chronic Cholecystitis w/ possible Cholecystoenteric Fistula Choledocholithiasis with elevated LFT's - Patient previously seen by Dr. Rubin in the office for his GB in 2025 and felt Surgery should be avoided if possible due to high cardiac risk; consider cholecystostomy tube replacement if acute cholecystitis occurs. - No indication for emergency surgery. No indication for IR perc chole at this time. Okay to continue abx - Recommend GI consult for Choledocholithiasis  - We will follow peripherally. Please call back with questions or concerns.   I reviewed nursing notes, hospitalist notes, last 24 h vitals and pain scores, last 48 h intake and output, last 24 h labs and trends, and last 24 h imaging results.   Ozell CHRISTELLA Shaper, [facility] 08/15/2024, 7:32 AM Please see Amion for pager number during day hours 7:00am-4:30pm     [1]  Allergies Allergen Reactions   Shellfish Protein-Containing Drug Products Anaphylaxis   Nsaids Other (See Comments)    H/o gastric ulcers = NO NSAIDs per GI   Morphine  Other (See Comments)     Hallucinations    Zolpidem Tartrate Other (See Comments)    Hallucinations   "

## 2024-08-15 NOTE — Consult Note (Addendum)
 "      Consultation  Referring Provider:  [facility]  Primary Care Physician:  Yolande Toribio MATSU, MD Primary Gastroenterologist:  Dr. Albertus       Reason for Consultation: Choledocholithiasis  LOS: 1 day          HPI:   Nathan [name] is a 79 y.o. male with past medical history significant for chronic calculous cholecystitis, hypertension, mild dementia, diabetes type 2, CKD stage IV, presents for evaluation of choledocholithiasis.  Patient is very somnolent during my evaluation which made obtaining history quite difficult.  Reportedly came into the hospital with nausea, vomiting, fever, chills and states it felt like his typical gallbladder pain. History of chronic calculus cholecystitis and E. coli bacteremia January 2025, s/p [facility] 02/2023, not a surgical candidate   Wife not present at the time of my evaluation.  Per chart review his drain reportedly fell out and never got replaced per wife's statement.  Most recent HIDA scan January 2025 confirmed chronic calculus cholecystitis.   Upon arrival to ED workup notable for - CTAP with contrast:  Fluid-filled gallbladder is not seen. Foci of gas and high-density material in the expected location of the gallbladder/gallbladder fossa. Recommend correlation for history of cholecystectomy. Similar findings were described on prior PET.Findings may be postsurgical in etiology,but are not well-definedby CT. The distal stomach approaches this region of abnormality, anda gastric fistula is also considered. Numerous noncalcified gallstones in the distal common bile duct,distally stone or contiguous stones spans 19 mm.The common bile duct is dilated at 13 mm. Minimal pneumobilia, likely postprocedural. Mild central intrahepatic biliary ductal dilatation. - WBC 13.2, Hgb 11.1, platelets 169 - Sodium 133, potassium 5.2 - Creatinine 1.88, BUN 52, GFR 36 which is his baseline - AST 817/ALT 976/alk phos 190/total bilirubin 1.7 - Lipase 52 - Hypotensive 98/58, heart  rate 102, temperature 98.5  Patient was given ceftriaxone  and Flagyl  in the ED and then transition to IV Zosyn .  Also given IVF and Tylenol   PREVIOUS GI WORKUP   Status post cholecystostomy tube 03/02/2023 for acute cholecystitis 07/14/2023 CT abdomen pelvis with contrast for weakness shows inflammatory changes involving gallbladder consistent with acute cholecystitis CBD dilation, diverticulosis without diverticulitis 07/15/2023 HIDA scan with delayed gallbladder filling visualized during second hour of imaging consistent with chronic calculus cholecystitis patent CBD  Past Medical History:  Diagnosis Date   Allergy    Aortic stenosis    Arthritis    Balance problem 04/26/2022   Blood transfusion without reported diagnosis    CAD (coronary artery disease)    Cataract    both eyes surgically removed   Diabetes mellitus, type 2 (HCC)    Diverticulosis of colon (without mention of hemorrhage)    Duodenal ulcer perforation (HCC)    blood transfusion   Flesh-eating bacteria (HCC) 1995   GERD (gastroesophageal reflux disease)    Glaucoma    pre glaucoma on Xalatan  drops   Heart murmur    History of diabetic retinopathy    History of gastrointestinal bleeding    History of necrotizing fasciitis    History of prosthetic aortic valve    Hypercholesteremia    Melanoma (HCC)    Myocardial infarction (HCC) 02/23/2016   Neuropathic pain 04/26/2022   Nightmares    CHRONIC   OSA (obstructive sleep apnea)    borderline no CPAP    Personal history of colonic polyps 11/23/2009   TUBULAR ADENOMA   Postoperative anemia    Sinus bradycardia    Sleep apnea  no cpap   Stomach ulcer    Hx of   Systolic hypertension    Vitamin B12 deficiency     Surgical History:  He  has a past surgical history that includes Retinal laser procedure; Coronary artery bypass graft (2010); Scrotal surgery (1995); Hernia repair (1972); Aortic valve replacement; flesh eating disease surgery ; Umbilical hernia  repair (N/A, 04/10/2013); Cataract extraction w/ intraocular lens implant; Blepharoplasty (11/24/2014); Cardiac catheterization (N/A, 03/01/2016); Colonoscopy; Polypectomy; IR Perc Cholecystostomy (03/02/2023); IR EXCHANGE BILIARY DRAIN (03/07/2023); and IR EXCHANGE BILIARY DRAIN (04/18/2023). Family History:  His family history includes Alcohol  abuse in his father; Cirrhosis in his father; Colon cancer (age of onset: 63) in his mother; Diabetes in his mother and sister; Hypertension in his mother and sister; Rectal cancer in his mother. Social History:   reports that he has never smoked. He has never used smokeless tobacco. He reports that he does not drink alcohol  and does not use drugs.  Prior to Admission medications  Medication Sig Start Date End Date Taking? Authorizing Provider  allopurinol  (ZYLOPRIM ) 300 MG tablet Take 300 mg by mouth daily.   Yes [provider]  ALPRAZolam  (XANAX ) 0.25 MG tablet Take 0.25 mg by mouth daily as needed for anxiety or sleep. 06/04/24  Yes [provider]  atorvastatin  (LIPITOR) 20 MG tablet Take 1 tablet (20 mg total) by mouth daily. 07/26/23  Yes Samtani, Jai-Gurmukh, MD  cyanocobalamin  (VITAMIN B12) 1000 MCG/ML injection Inject 1 mL into the muscle every 30 (thirty) days. 03/13/23  Yes [provider]  fish oil-omega-3 fatty acids 1000 MG capsule Take 1 g by mouth 2 (two) times daily.   Yes [provider]  furosemide  (LASIX ) 20 MG tablet Take 1 tablet (20 mg total) by mouth daily. 01/19/24 08/15/24 Yes Vannie Reche RAMAN, NP  latanoprost  (XALATAN ) 0.005 % ophthalmic solution Place 1 drop into both eyes at bedtime.   Yes [provider]  metoprolol  succinate (TOPROL -XL) 100 MG 24 hr tablet Take 1 tablet (100 mg total) by mouth daily. Take with or immediately following a meal. 07/27/23  Yes Samtani, Jai-Gurmukh, MD  Multiple Vitamin (MULTI-VITAMIN) tablet Take 1 tablet by mouth daily. Patient taking differently: Take 1  tablet by mouth 2 (two) times daily.   Yes [provider]  nitroGLYCERIN  (NITROSTAT ) 0.4 MG SL tablet Place 1 tablet under the tongue as needed for chest pain. 09/01/16  Yes [provider]  pantoprazole  (PROTONIX ) 40 MG tablet Take 40 mg by mouth 2 (two) times daily. 01/08/24  Yes [provider]  aspirin  EC 81 MG tablet Take 1 tablet (81 mg total) by mouth daily. Swallow whole. Patient not taking: Reported on 08/15/2024 01/19/24   Walker, Caitlin S, NP  metFORMIN (GLUCOPHAGE) 1000 MG tablet Take 1,000 mg by mouth in the morning and at bedtime. Patient not taking: Reported on 08/15/2024    [provider]  prochlorperazine  (COMPAZINE ) 10 MG tablet Take 10 mg by mouth every 6 (six) hours as needed for nausea or vomiting. Patient not taking: Reported on 08/15/2024 06/19/24   [provider]    Current Facility-Administered Medications  Medication Dose Route Frequency Provider Last Rate Last Admin   0.9 %  sodium chloride  infusion   Intravenous Continuous Gherghe, Costin M, MD 100 mL/hr at 08/15/24 0700 New Bag at 08/15/24 0700   acetaminophen  (TYLENOL ) tablet 650 mg  650 mg Oral Q6H PRN Gherghe, Costin M, MD   650 mg at 08/15/24 9346   Or  acetaminophen  (TYLENOL ) suppository 650 mg  650 mg Rectal Q6H PRN Gherghe, Costin M, MD       ALPRAZolam  (XANAX ) tablet 0.5 mg  0.5 mg Oral QHS PRN Gherghe, Costin M, MD       heparin  injection 5,000 Units  5,000 Units Subcutaneous Q8H Gherghe, Costin M, MD   5,000 Units at 08/15/24 9346   metoprolol  succinate (TOPROL -XL) 24 hr tablet 25 mg  25 mg Oral Daily Gherghe, Costin M, MD       ondansetron  (ZOFRAN ) tablet 4 mg  4 mg Oral Q6H PRN Gherghe, Costin M, MD       Or   ondansetron  (ZOFRAN ) injection 4 mg  4 mg Intravenous Q6H PRN Gherghe, Costin M, MD       pantoprazole  (PROTONIX ) EC tablet 40 mg  40 mg Oral BID Gherghe, Costin M, MD       piperacillin -tazobactam (ZOSYN ) IVPB 3.375 g  3.375 g Intravenous Q8H Gherghe,  Costin M, MD 12.5 mL/hr at 08/15/24 0832 3.375 g at 08/15/24 9167   Current Outpatient Medications  Medication Sig Dispense Refill   allopurinol  (ZYLOPRIM ) 300 MG tablet Take 300 mg by mouth daily.     ALPRAZolam  (XANAX ) 0.25 MG tablet Take 0.25 mg by mouth daily as needed for anxiety or sleep.     atorvastatin  (LIPITOR) 20 MG tablet Take 1 tablet (20 mg total) by mouth daily.     cyanocobalamin  (VITAMIN B12) 1000 MCG/ML injection Inject 1 mL into the muscle every 30 (thirty) days.     fish oil-omega-3 fatty acids 1000 MG capsule Take 1 g by mouth 2 (two) times daily.     furosemide  (LASIX ) 20 MG tablet Take 1 tablet (20 mg total) by mouth daily. 30 tablet 11   latanoprost  (XALATAN ) 0.005 % ophthalmic solution Place 1 drop into both eyes at bedtime.     metoprolol  succinate (TOPROL -XL) 100 MG 24 hr tablet Take 1 tablet (100 mg total) by mouth daily. Take with or immediately following a meal.     Multiple Vitamin (MULTI-VITAMIN) tablet Take 1 tablet by mouth daily. (Patient taking differently: Take 1 tablet by mouth 2 (two) times daily.)     nitroGLYCERIN  (NITROSTAT ) 0.4 MG SL tablet Place 1 tablet under the tongue as needed for chest pain.  12   pantoprazole  (PROTONIX ) 40 MG tablet Take 40 mg by mouth 2 (two) times daily.     aspirin  EC 81 MG tablet Take 1 tablet (81 mg total) by mouth daily. Swallow whole. (Patient not taking: Reported on 08/15/2024)     metFORMIN (GLUCOPHAGE) 1000 MG tablet Take 1,000 mg by mouth in the morning and at bedtime. (Patient not taking: Reported on 08/15/2024)     prochlorperazine  (COMPAZINE ) 10 MG tablet Take 10 mg by mouth every 6 (six) hours as needed for nausea or vomiting. (Patient not taking: Reported on 08/15/2024)      Allergies as of 08/14/2024 - Review Complete 08/14/2024  Allergen Reaction Noted   Shellfish protein-containing drug products Anaphylaxis 08/30/2010   Nsaids Other (See Comments) 07/14/2023   Morphine  Other (See Comments) 01/16/2024   Zolpidem  tartrate Other (See Comments) 04/20/2011    Review of Systems  Constitutional:  Positive for chills, fever and malaise/fatigue. Negative for weight loss.  HENT:  Negative for hearing loss and tinnitus.   Eyes:  Negative for blurred vision and double vision.  Respiratory:  Negative for cough and hemoptysis.   Cardiovascular:  Negative for chest pain and palpitations.  Gastrointestinal:  Positive for abdominal pain, nausea and vomiting. Negative for blood in stool, constipation, diarrhea, heartburn and melena.  Genitourinary:  Negative for dysuria and urgency.  Musculoskeletal:  Negative for myalgias and neck pain.  Skin:  Negative for itching and rash.  Neurological:  Negative for seizures and loss of consciousness.  Psychiatric/Behavioral:  Negative for depression and suicidal ideas.        Physical Exam:  Vital signs in last 24 hours: Temp:  [98 F (36.7 C)-100.4 F (38 C)] 98.5 F (36.9 C) (02/05 0818) Pulse Rate:  [94-126] 98 (02/05 0939) Resp:  [18-20] 18 (02/05 0930) BP: (87-127)/(58-86) 98/58 (02/05 0939) SpO2:  [94 %-100 %] 94 % (02/05 0930) Weight:  [58.3 kg] 58.3 kg (02/04 2300) Last BM Date : 08/14/24 Last BM recorded by nurses in past 5 days No data recorded  Physical Exam Constitutional:      Appearance: He is ill-appearing.     Comments: Somnolent, difficult historian as a result  HENT:     Head: Normocephalic and atraumatic.     Mouth/Throat:     Mouth: Mucous membranes are moist.     Pharynx: Oropharynx is clear.  Eyes:     General: No scleral icterus.    Extraocular Movements: Extraocular movements intact.  Cardiovascular:     Rate and Rhythm: Normal rate and regular rhythm.  Pulmonary:     Effort: Pulmonary effort is normal. No respiratory distress.  Abdominal:     General: There is no distension.     Palpations: Abdomen is soft. There is no mass.     Tenderness: There is no abdominal tenderness. There is no guarding or rebound.     Hernia: No  hernia is present.  Musculoskeletal:        General: No swelling. Normal range of motion.  Skin:    General: Skin is warm and dry.  Neurological:     General: No focal deficit present.     Mental Status: He is oriented to person, place, and time.  Psychiatric:        Mood and Affect: Mood normal.        Behavior: Behavior normal.        Thought Content: Thought content normal.        Judgment: Judgment normal.      LAB RESULTS: Recent Labs    08/14/24 2100 08/15/24 0315  WBC 13.2* 9.5  HGB 11.1* 10.2*  HCT 34.5* 31.3*  PLT 169 126*   BMET Recent Labs    08/14/24 2215 08/15/24 0410  NA 133* 137  K 5.2* 4.4  CL 99 100  CO2 23 22  GLUCOSE 200* 122*  BUN 52* 48*  CREATININE 1.88* 1.91*  CALCIUM  8.6* 9.4   LFT Recent Labs    08/15/24 0410  PROT 6.5  ALBUMIN 3.8  AST 692*  ALT 951*  ALKPHOS 216*  BILITOT 1.9*   PT/INR Recent Labs    08/14/24 2215  LABPROT 16.4*  INR 1.3*    STUDIES: CT ABDOMEN PELVIS W CONTRAST Result Date: 08/14/2024 CLINICAL DATA:  Acute abdominal pain.  Weakness. EXAM: CT ABDOMEN AND PELVIS WITH CONTRAST TECHNIQUE: Multidetector CT imaging of the abdomen and pelvis was performed using the standard protocol following bolus administration of intravenous contrast. RADIATION DOSE REDUCTION: This exam was performed according to the departmental dose-optimization program which includes automated exposure control, adjustment of the mA and/or kV according to patient size and/or use of iterative reconstruction technique. CONTRAST:  80mL OMNIPAQUE  IOHEXOL   300 MG/ML  SOLN COMPARISON:  CT 01/16/2024, report from PET CT 06/05/2024 from an outside institution reviewed, images not available FINDINGS: Lower chest: Chronic interstitial lung disease. The heart is enlarged. Coronary artery calcifications. Hepatobiliary: Mild heterogeneous appearance of the liver. No discrete focal lesion. Fluid-filled gallbladder is not seen. Foci of gas and high-density  material in the expected location of the gallbladder/gallbladder fossa, series 2, image 30, also described on prior PET. The distal stomach calms in contact with this abnormality. There are numerous noncalcified gallstones in the distal common bile duct, distally stone or contiguous stones spans 19 mm series 8, image 72. The common bile duct is dilated at 13 mm. Minimal pneumobilia. Mild central intrahepatic biliary ductal dilatation. Pancreas: No ductal dilatation or inflammation. Spleen: Calcified granuloma in the spleen. Adrenals/Urinary Tract: No adrenal nodule. No hydronephrosis. Bilateral renal cysts. No further follow-up imaging is recommended. No renal calculi. Nondistended urinary bladder with mild diffuse bladder wall thickening. No perivesicular inflammation. Stomach/Bowel: The stomach is nondistended and not well evaluated. Distal stomach abuts abnormal appearance of the gallbladder/gallbladder fossa. Fecalization of distal small bowel contents. No obstruction. High-riding cecum in the mid abdomen. The appendix is normal. Moderate volume of colonic stool. Colonic diverticulosis without focal diverticulitis. Small bowel in the left abdomen projects lateral to the descending colon, but no wall thickening or inflammatory change. Similar findings were seen previously. Vascular/Lymphatic: Aortic atherosclerosis. No aortic aneurysm. Patent portal vein. No suspicious lymphadenopathy. Reproductive: Prominent prostate. Other: No free air or ascites. Suspect prior umbilical hernia repair. Musculoskeletal: Chronic bilateral L5 pars interarticularis defects with grade 1 anterolisthesis of L5 on S1. Scattered Schmorl's nodes in the spine. There are no acute or suspicious osseous abnormalities. IMPRESSION: 1. Fluid-filled gallbladder is not seen. Foci of gas and high-density material in the expected location of the gallbladder/gallbladder fossa. Recommend correlation for history of cholecystectomy. Similar findings  were described on prior PET. Findings may be postsurgical in etiology,, but are not well-defined by CT. The distal stomach approaches this region of abnormality, and a gastric fistula is also considered. 2. Numerous noncalcified gallstones in the distal common bile duct, distally stone or contiguous stones spans 19 mm. The common bile duct is dilated at 13 mm. Minimal pneumobilia, likely postprocedural. Mild central intrahepatic biliary ductal dilatation. 3. Colonic diverticulosis without diverticulitis. 4. Chronic interstitial lung disease. Aortic Atherosclerosis (ICD10-I70.0). Electronically Signed   By: Andrea Gasman M.D.   On: 08/14/2024 23:50   DG Chest Port 1 View Result Date: 08/14/2024 EXAM: 1 VIEW(S) XRAY OF THE CHEST 08/14/2024 09:18:00 PM COMPARISON: Comparison with 01/16/2024. CLINICAL HISTORY: Questionable sepsis - evaluate for abnormality. FINDINGS: LUNGS AND PLEURA: Low lung volumes accentuate pulmonary vascularity. No focal pulmonary opacity. No pleural effusion. No pneumothorax. HEART AND MEDIASTINUM: Cardiomegaly. Sternotomy and CABG. BONES AND SOFT TISSUES: Remote left rib fracture. IMPRESSION: 1. No acute cardiopulmonary abnormality. 2. Cardiomegaly. 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  Previous admission January 2025 with chronic calculous cholecystitis and E. coli bacteremia s/p [facility] 02/2023, not a surgical candidate. CTAP with contrast 08/15/2023: Numerous noncalcified gallstones in distal CBD with CBD at 13 mm, pneumobilia, mild central intrahepatic biliary ductal dilation, concern for choledochoduodenal fistula AST 692/ALT 951/alk phos 216/total bilirubin 1.9 Improving leukocytosis On Zosyn  Surgery following - MRCP for further evaluation, pending  MRCP consider ERCP - General  Surgery holding off as there is no indication for emergency surgery or IR perc chole at this time - Continue IV Zosyn  - Continue to trend LFTs  IDA Anemia of chronic disease versus iron deficiency suspicious of bone marrow suppression per hematology.   Negative Hemoccult cards per last visit October 2025.  Risks of endoscopic evaluation outweigh benefits at this time. - Hgb stable - Continue daily CBC and transfuse as needed to maintain HGB > 7   Thrombocytopenia  Chronic interstitial lung disease  Chronic systolic CHF PAF CAD s/p CABG 02/12/7973 echo ejection fraction 35 to 40%, normal RVSP, status post aortic valve repair, no aortic stenosis Not on anticoagulation due to history of spontaneous bleeding requiring 3 units  CKD stage IV  Mild dementia  Type 2 diabetes   Thank you for your kind consultation, we will continue to follow.   Bayley CHRISTELLA Blower  08/15/2024, 10:25 AM     [facility] Attending   I have taken an interval history, reviewed the chart and examined the patient. I agree with the Advanced Practitioner's note, impression and recommendations as written.  The majority of the medical decision making was performed by me.  Here are my additions to the note:  Patient with choledocholithiasis, possible choledocho duodenal versus choledocho gastric fistula.  He was septic based upon clinical history and has E. coli, Enterobacter rallies, and Klebsiella in the blood and has been changed to meropenem .  It is appropriate to attempt ERCP and clearance of common bile duct stones.  I think these are a nidus for infection.  Gallbladder also possible issue but surgery is not in his best interest.  Wife is present the patient was alert and oriented x 3 when I saw him.  Will plan for ERCP tomorrow.  I fully reviewed the risks benefits and indications with the patient and his wife.  I have personally reviewed the CT images as well.  This is high  complexity medical decision making.  This patient needs ERCP but is higher risk of complications given his age and comorbidities.  Hold heparin  2/6  Lupita CHARLENA Commander, MD, [facility] See TRACEY on call - gastroenterology for best contact person 08/15/2024 4:04 PM   "

## 2024-08-15 NOTE — ED Notes (Signed)
 MRI coming for pt. He needs to be saline locked. Meds held until he gets back

## 2024-08-15 NOTE — H&P (View-Only) (Signed)
 "      Consultation  Referring Provider:  [facility]  Primary Care Physician:  Yolande Toribio MATSU, MD Primary Gastroenterologist:  Dr. Albertus       Reason for Consultation: Choledocholithiasis  LOS: 1 day          HPI:   Nathan [name] is a 79 y.o. male with past medical history significant for chronic calculous cholecystitis, hypertension, mild dementia, diabetes type 2, CKD stage IV, presents for evaluation of choledocholithiasis.  Patient is very somnolent during my evaluation which made obtaining history quite difficult.  Reportedly came into the hospital with nausea, vomiting, fever, chills and states it felt like his typical gallbladder pain. History of chronic calculus cholecystitis and E. coli bacteremia January 2025, s/p [facility] 02/2023, not a surgical candidate   Wife not present at the time of my evaluation.  Per chart review his drain reportedly fell out and never got replaced per wife's statement.  Most recent HIDA scan January 2025 confirmed chronic calculus cholecystitis.   Upon arrival to ED workup notable for - CTAP with contrast:  Fluid-filled gallbladder is not seen. Foci of gas and high-density material in the expected location of the gallbladder/gallbladder fossa. Recommend correlation for history of cholecystectomy. Similar findings were described on prior PET.Findings may be postsurgical in etiology,but are not well-definedby CT. The distal stomach approaches this region of abnormality, anda gastric fistula is also considered. Numerous noncalcified gallstones in the distal common bile duct,distally stone or contiguous stones spans 19 mm.The common bile duct is dilated at 13 mm. Minimal pneumobilia, likely postprocedural. Mild central intrahepatic biliary ductal dilatation. - WBC 13.2, Hgb 11.1, platelets 169 - Sodium 133, potassium 5.2 - Creatinine 1.88, BUN 52, GFR 36 which is his baseline - AST 817/ALT 976/alk phos 190/total bilirubin 1.7 - Lipase 52 - Hypotensive 98/58, heart  rate 102, temperature 98.5  Patient was given ceftriaxone  and Flagyl  in the ED and then transition to IV Zosyn .  Also given IVF and Tylenol   PREVIOUS GI WORKUP   Status post cholecystostomy tube 03/02/2023 for acute cholecystitis 07/14/2023 CT abdomen pelvis with contrast for weakness shows inflammatory changes involving gallbladder consistent with acute cholecystitis CBD dilation, diverticulosis without diverticulitis 07/15/2023 HIDA scan with delayed gallbladder filling visualized during second hour of imaging consistent with chronic calculus cholecystitis patent CBD  Past Medical History:  Diagnosis Date   Allergy    Aortic stenosis    Arthritis    Balance problem 04/26/2022   Blood transfusion without reported diagnosis    CAD (coronary artery disease)    Cataract    both eyes surgically removed   Diabetes mellitus, type 2 (HCC)    Diverticulosis of colon (without mention of hemorrhage)    Duodenal ulcer perforation (HCC)    blood transfusion   Flesh-eating bacteria (HCC) 1995   GERD (gastroesophageal reflux disease)    Glaucoma    pre glaucoma on Xalatan  drops   Heart murmur    History of diabetic retinopathy    History of gastrointestinal bleeding    History of necrotizing fasciitis    History of prosthetic aortic valve    Hypercholesteremia    Melanoma (HCC)    Myocardial infarction (HCC) 02/23/2016   Neuropathic pain 04/26/2022   Nightmares    CHRONIC   OSA (obstructive sleep apnea)    borderline no CPAP    Personal history of colonic polyps 11/23/2009   TUBULAR ADENOMA   Postoperative anemia    Sinus bradycardia    Sleep apnea  no cpap   Stomach ulcer    Hx of   Systolic hypertension    Vitamin B12 deficiency     Surgical History:  He  has a past surgical history that includes Retinal laser procedure; Coronary artery bypass graft (2010); Scrotal surgery (1995); Hernia repair (1972); Aortic valve replacement; flesh eating disease surgery ; Umbilical hernia  repair (N/A, 04/10/2013); Cataract extraction w/ intraocular lens implant; Blepharoplasty (11/24/2014); Cardiac catheterization (N/A, 03/01/2016); Colonoscopy; Polypectomy; IR Perc Cholecystostomy (03/02/2023); IR EXCHANGE BILIARY DRAIN (03/07/2023); and IR EXCHANGE BILIARY DRAIN (04/18/2023). Family History:  His family history includes Alcohol  abuse in his father; Cirrhosis in his father; Colon cancer (age of onset: 63) in his mother; Diabetes in his mother and sister; Hypertension in his mother and sister; Rectal cancer in his mother. Social History:   reports that he has never smoked. He has never used smokeless tobacco. He reports that he does not drink alcohol  and does not use drugs.  Prior to Admission medications  Medication Sig Start Date End Date Taking? Authorizing Provider  allopurinol  (ZYLOPRIM ) 300 MG tablet Take 300 mg by mouth daily.   Yes [provider]  ALPRAZolam  (XANAX ) 0.25 MG tablet Take 0.25 mg by mouth daily as needed for anxiety or sleep. 06/04/24  Yes [provider]  atorvastatin  (LIPITOR) 20 MG tablet Take 1 tablet (20 mg total) by mouth daily. 07/26/23  Yes Samtani, Jai-Gurmukh, MD  cyanocobalamin  (VITAMIN B12) 1000 MCG/ML injection Inject 1 mL into the muscle every 30 (thirty) days. 03/13/23  Yes [provider]  fish oil-omega-3 fatty acids 1000 MG capsule Take 1 g by mouth 2 (two) times daily.   Yes [provider]  furosemide  (LASIX ) 20 MG tablet Take 1 tablet (20 mg total) by mouth daily. 01/19/24 08/15/24 Yes Vannie Reche RAMAN, NP  latanoprost  (XALATAN ) 0.005 % ophthalmic solution Place 1 drop into both eyes at bedtime.   Yes [provider]  metoprolol  succinate (TOPROL -XL) 100 MG 24 hr tablet Take 1 tablet (100 mg total) by mouth daily. Take with or immediately following a meal. 07/27/23  Yes Samtani, Jai-Gurmukh, MD  Multiple Vitamin (MULTI-VITAMIN) tablet Take 1 tablet by mouth daily. Patient taking differently: Take 1  tablet by mouth 2 (two) times daily.   Yes [provider]  nitroGLYCERIN  (NITROSTAT ) 0.4 MG SL tablet Place 1 tablet under the tongue as needed for chest pain. 09/01/16  Yes [provider]  pantoprazole  (PROTONIX ) 40 MG tablet Take 40 mg by mouth 2 (two) times daily. 01/08/24  Yes [provider]  aspirin  EC 81 MG tablet Take 1 tablet (81 mg total) by mouth daily. Swallow whole. Patient not taking: Reported on 08/15/2024 01/19/24   Walker, Caitlin S, NP  metFORMIN (GLUCOPHAGE) 1000 MG tablet Take 1,000 mg by mouth in the morning and at bedtime. Patient not taking: Reported on 08/15/2024    [provider]  prochlorperazine  (COMPAZINE ) 10 MG tablet Take 10 mg by mouth every 6 (six) hours as needed for nausea or vomiting. Patient not taking: Reported on 08/15/2024 06/19/24   [provider]    Current Facility-Administered Medications  Medication Dose Route Frequency Provider Last Rate Last Admin   0.9 %  sodium chloride  infusion   Intravenous Continuous Gherghe, Costin M, MD 100 mL/hr at 08/15/24 0700 New Bag at 08/15/24 0700   acetaminophen  (TYLENOL ) tablet 650 mg  650 mg Oral Q6H PRN Gherghe, Costin M, MD   650 mg at 08/15/24 9346   Or  acetaminophen  (TYLENOL ) suppository 650 mg  650 mg Rectal Q6H PRN Gherghe, Costin M, MD       ALPRAZolam  (XANAX ) tablet 0.5 mg  0.5 mg Oral QHS PRN Gherghe, Costin M, MD       heparin  injection 5,000 Units  5,000 Units Subcutaneous Q8H Gherghe, Costin M, MD   5,000 Units at 08/15/24 9346   metoprolol  succinate (TOPROL -XL) 24 hr tablet 25 mg  25 mg Oral Daily Gherghe, Costin M, MD       ondansetron  (ZOFRAN ) tablet 4 mg  4 mg Oral Q6H PRN Gherghe, Costin M, MD       Or   ondansetron  (ZOFRAN ) injection 4 mg  4 mg Intravenous Q6H PRN Gherghe, Costin M, MD       pantoprazole  (PROTONIX ) EC tablet 40 mg  40 mg Oral BID Gherghe, Costin M, MD       piperacillin -tazobactam (ZOSYN ) IVPB 3.375 g  3.375 g Intravenous Q8H Gherghe,  Costin M, MD 12.5 mL/hr at 08/15/24 0832 3.375 g at 08/15/24 9167   Current Outpatient Medications  Medication Sig Dispense Refill   allopurinol  (ZYLOPRIM ) 300 MG tablet Take 300 mg by mouth daily.     ALPRAZolam  (XANAX ) 0.25 MG tablet Take 0.25 mg by mouth daily as needed for anxiety or sleep.     atorvastatin  (LIPITOR) 20 MG tablet Take 1 tablet (20 mg total) by mouth daily.     cyanocobalamin  (VITAMIN B12) 1000 MCG/ML injection Inject 1 mL into the muscle every 30 (thirty) days.     fish oil-omega-3 fatty acids 1000 MG capsule Take 1 g by mouth 2 (two) times daily.     furosemide  (LASIX ) 20 MG tablet Take 1 tablet (20 mg total) by mouth daily. 30 tablet 11   latanoprost  (XALATAN ) 0.005 % ophthalmic solution Place 1 drop into both eyes at bedtime.     metoprolol  succinate (TOPROL -XL) 100 MG 24 hr tablet Take 1 tablet (100 mg total) by mouth daily. Take with or immediately following a meal.     Multiple Vitamin (MULTI-VITAMIN) tablet Take 1 tablet by mouth daily. (Patient taking differently: Take 1 tablet by mouth 2 (two) times daily.)     nitroGLYCERIN  (NITROSTAT ) 0.4 MG SL tablet Place 1 tablet under the tongue as needed for chest pain.  12   pantoprazole  (PROTONIX ) 40 MG tablet Take 40 mg by mouth 2 (two) times daily.     aspirin  EC 81 MG tablet Take 1 tablet (81 mg total) by mouth daily. Swallow whole. (Patient not taking: Reported on 08/15/2024)     metFORMIN (GLUCOPHAGE) 1000 MG tablet Take 1,000 mg by mouth in the morning and at bedtime. (Patient not taking: Reported on 08/15/2024)     prochlorperazine  (COMPAZINE ) 10 MG tablet Take 10 mg by mouth every 6 (six) hours as needed for nausea or vomiting. (Patient not taking: Reported on 08/15/2024)      Allergies as of 08/14/2024 - Review Complete 08/14/2024  Allergen Reaction Noted   Shellfish protein-containing drug products Anaphylaxis 08/30/2010   Nsaids Other (See Comments) 07/14/2023   Morphine  Other (See Comments) 01/16/2024   Zolpidem  tartrate Other (See Comments) 04/20/2011    Review of Systems  Constitutional:  Positive for chills, fever and malaise/fatigue. Negative for weight loss.  HENT:  Negative for hearing loss and tinnitus.   Eyes:  Negative for blurred vision and double vision.  Respiratory:  Negative for cough and hemoptysis.   Cardiovascular:  Negative for chest pain and palpitations.  Gastrointestinal:  Positive for abdominal pain, nausea and vomiting. Negative for blood in stool, constipation, diarrhea, heartburn and melena.  Genitourinary:  Negative for dysuria and urgency.  Musculoskeletal:  Negative for myalgias and neck pain.  Skin:  Negative for itching and rash.  Neurological:  Negative for seizures and loss of consciousness.  Psychiatric/Behavioral:  Negative for depression and suicidal ideas.        Physical Exam:  Vital signs in last 24 hours: Temp:  [98 F (36.7 C)-100.4 F (38 C)] 98.5 F (36.9 C) (02/05 0818) Pulse Rate:  [94-126] 98 (02/05 0939) Resp:  [18-20] 18 (02/05 0930) BP: (87-127)/(58-86) 98/58 (02/05 0939) SpO2:  [94 %-100 %] 94 % (02/05 0930) Weight:  [58.3 kg] 58.3 kg (02/04 2300) Last BM Date : 08/14/24 Last BM recorded by nurses in past 5 days No data recorded  Physical Exam Constitutional:      Appearance: He is ill-appearing.     Comments: Somnolent, difficult historian as a result  HENT:     Head: Normocephalic and atraumatic.     Mouth/Throat:     Mouth: Mucous membranes are moist.     Pharynx: Oropharynx is clear.  Eyes:     General: No scleral icterus.    Extraocular Movements: Extraocular movements intact.  Cardiovascular:     Rate and Rhythm: Normal rate and regular rhythm.  Pulmonary:     Effort: Pulmonary effort is normal. No respiratory distress.  Abdominal:     General: There is no distension.     Palpations: Abdomen is soft. There is no mass.     Tenderness: There is no abdominal tenderness. There is no guarding or rebound.     Hernia: No  hernia is present.  Musculoskeletal:        General: No swelling. Normal range of motion.  Skin:    General: Skin is warm and dry.  Neurological:     General: No focal deficit present.     Mental Status: He is oriented to person, place, and time.  Psychiatric:        Mood and Affect: Mood normal.        Behavior: Behavior normal.        Thought Content: Thought content normal.        Judgment: Judgment normal.      LAB RESULTS: Recent Labs    08/14/24 2100 08/15/24 0315  WBC 13.2* 9.5  HGB 11.1* 10.2*  HCT 34.5* 31.3*  PLT 169 126*   BMET Recent Labs    08/14/24 2215 08/15/24 0410  NA 133* 137  K 5.2* 4.4  CL 99 100  CO2 23 22  GLUCOSE 200* 122*  BUN 52* 48*  CREATININE 1.88* 1.91*  CALCIUM  8.6* 9.4   LFT Recent Labs    08/15/24 0410  PROT 6.5  ALBUMIN 3.8  AST 692*  ALT 951*  ALKPHOS 216*  BILITOT 1.9*   PT/INR Recent Labs    08/14/24 2215  LABPROT 16.4*  INR 1.3*    STUDIES: CT ABDOMEN PELVIS W CONTRAST Result Date: 08/14/2024 CLINICAL DATA:  Acute abdominal pain.  Weakness. EXAM: CT ABDOMEN AND PELVIS WITH CONTRAST TECHNIQUE: Multidetector CT imaging of the abdomen and pelvis was performed using the standard protocol following bolus administration of intravenous contrast. RADIATION DOSE REDUCTION: This exam was performed according to the departmental dose-optimization program which includes automated exposure control, adjustment of the mA and/or kV according to patient size and/or use of iterative reconstruction technique. CONTRAST:  80mL OMNIPAQUE  IOHEXOL   300 MG/ML  SOLN COMPARISON:  CT 01/16/2024, report from PET CT 06/05/2024 from an outside institution reviewed, images not available FINDINGS: Lower chest: Chronic interstitial lung disease. The heart is enlarged. Coronary artery calcifications. Hepatobiliary: Mild heterogeneous appearance of the liver. No discrete focal lesion. Fluid-filled gallbladder is not seen. Foci of gas and high-density  material in the expected location of the gallbladder/gallbladder fossa, series 2, image 30, also described on prior PET. The distal stomach calms in contact with this abnormality. There are numerous noncalcified gallstones in the distal common bile duct, distally stone or contiguous stones spans 19 mm series 8, image 72. The common bile duct is dilated at 13 mm. Minimal pneumobilia. Mild central intrahepatic biliary ductal dilatation. Pancreas: No ductal dilatation or inflammation. Spleen: Calcified granuloma in the spleen. Adrenals/Urinary Tract: No adrenal nodule. No hydronephrosis. Bilateral renal cysts. No further follow-up imaging is recommended. No renal calculi. Nondistended urinary bladder with mild diffuse bladder wall thickening. No perivesicular inflammation. Stomach/Bowel: The stomach is nondistended and not well evaluated. Distal stomach abuts abnormal appearance of the gallbladder/gallbladder fossa. Fecalization of distal small bowel contents. No obstruction. High-riding cecum in the mid abdomen. The appendix is normal. Moderate volume of colonic stool. Colonic diverticulosis without focal diverticulitis. Small bowel in the left abdomen projects lateral to the descending colon, but no wall thickening or inflammatory change. Similar findings were seen previously. Vascular/Lymphatic: Aortic atherosclerosis. No aortic aneurysm. Patent portal vein. No suspicious lymphadenopathy. Reproductive: Prominent prostate. Other: No free air or ascites. Suspect prior umbilical hernia repair. Musculoskeletal: Chronic bilateral L5 pars interarticularis defects with grade 1 anterolisthesis of L5 on S1. Scattered Schmorl's nodes in the spine. There are no acute or suspicious osseous abnormalities. IMPRESSION: 1. Fluid-filled gallbladder is not seen. Foci of gas and high-density material in the expected location of the gallbladder/gallbladder fossa. Recommend correlation for history of cholecystectomy. Similar findings  were described on prior PET. Findings may be postsurgical in etiology,, but are not well-defined by CT. The distal stomach approaches this region of abnormality, and a gastric fistula is also considered. 2. Numerous noncalcified gallstones in the distal common bile duct, distally stone or contiguous stones spans 19 mm. The common bile duct is dilated at 13 mm. Minimal pneumobilia, likely postprocedural. Mild central intrahepatic biliary ductal dilatation. 3. Colonic diverticulosis without diverticulitis. 4. Chronic interstitial lung disease. Aortic Atherosclerosis (ICD10-I70.0). Electronically Signed   By: Andrea Gasman M.D.   On: 08/14/2024 23:50   DG Chest Port 1 View Result Date: 08/14/2024 EXAM: 1 VIEW(S) XRAY OF THE CHEST 08/14/2024 09:18:00 PM COMPARISON: Comparison with 01/16/2024. CLINICAL HISTORY: Questionable sepsis - evaluate for abnormality. FINDINGS: LUNGS AND PLEURA: Low lung volumes accentuate pulmonary vascularity. No focal pulmonary opacity. No pleural effusion. No pneumothorax. HEART AND MEDIASTINUM: Cardiomegaly. Sternotomy and CABG. BONES AND SOFT TISSUES: Remote left rib fracture. IMPRESSION: 1. No acute cardiopulmonary abnormality. 2. Cardiomegaly. 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  Previous admission January 2025 with chronic calculous cholecystitis and E. coli bacteremia s/p [facility] 02/2023, not a surgical candidate. CTAP with contrast 08/15/2023: Numerous noncalcified gallstones in distal CBD with CBD at 13 mm, pneumobilia, mild central intrahepatic biliary ductal dilation, concern for choledochoduodenal fistula AST 692/ALT 951/alk phos 216/total bilirubin 1.9 Improving leukocytosis On Zosyn  Surgery following - MRCP for further evaluation, pending  MRCP consider ERCP - General  Surgery holding off as there is no indication for emergency surgery or IR perc chole at this time - Continue IV Zosyn  - Continue to trend LFTs  IDA Anemia of chronic disease versus iron deficiency suspicious of bone marrow suppression per hematology.   Negative Hemoccult cards per last visit October 2025.  Risks of endoscopic evaluation outweigh benefits at this time. - Hgb stable - Continue daily CBC and transfuse as needed to maintain HGB > 7   Thrombocytopenia  Chronic interstitial lung disease  Chronic systolic CHF PAF CAD s/p CABG 02/12/7973 echo ejection fraction 35 to 40%, normal RVSP, status post aortic valve repair, no aortic stenosis Not on anticoagulation due to history of spontaneous bleeding requiring 3 units  CKD stage IV  Mild dementia  Type 2 diabetes   Thank you for your kind consultation, we will continue to follow.   Bayley CHRISTELLA Blower  08/15/2024, 10:25 AM     [facility] Attending   I have taken an interval history, reviewed the chart and examined the patient. I agree with the Advanced Practitioner's note, impression and recommendations as written.  The majority of the medical decision making was performed by me.  Here are my additions to the note:  Patient with choledocholithiasis, possible choledocho duodenal versus choledocho gastric fistula.  He was septic based upon clinical history and has E. coli, Enterobacter rallies, and Klebsiella in the blood and has been changed to meropenem .  It is appropriate to attempt ERCP and clearance of common bile duct stones.  I think these are a nidus for infection.  Gallbladder also possible issue but surgery is not in his best interest.  Wife is present the patient was alert and oriented x 3 when I saw him.  Will plan for ERCP tomorrow.  I fully reviewed the risks benefits and indications with the patient and his wife.  I have personally reviewed the CT images as well.  This is high  complexity medical decision making.  This patient needs ERCP but is higher risk of complications given his age and comorbidities.  Hold heparin  2/6  Lupita CHARLENA Commander, MD, [facility] See TRACEY on call - gastroenterology for best contact person 08/15/2024 4:04 PM   "

## 2024-08-15 NOTE — Progress Notes (Signed)
 PHARMACY - PHYSICIAN COMMUNICATION CRITICAL VALUE ALERT - BLOOD CULTURE IDENTIFICATION (BCID)  Nathan [name] is an 79 y.o. male who presented to [facility] on 08/14/2024 with a chief complaint of  Chief Complaint  Patient presents with   Weakness     Assessment:   4 out of 4 blood cultures positive for Ecoli AND Klebsiella Pneumoniae WITH + resistance genes   Name of physician (or Provider) Contacted: Dr. Cherlyn  Current antibiotics: Zosyn   Changes to prescribed antibiotics recommended:  Change Zosyn  to meropenem  1 gr IV q12h   Results for orders placed or performed during the hospital encounter of 08/14/24  Blood Culture ID Panel (Reflexed) (Collected: 08/14/2024  9:00 PM)  Result Value Ref Range   Enterococcus faecalis NOT DETECTED NOT DETECTED   Enterococcus Faecium NOT DETECTED NOT DETECTED   Listeria monocytogenes NOT DETECTED NOT DETECTED   Staphylococcus species NOT DETECTED NOT DETECTED   Staphylococcus aureus (BCID) NOT DETECTED NOT DETECTED   Staphylococcus epidermidis NOT DETECTED NOT DETECTED   Staphylococcus lugdunensis NOT DETECTED NOT DETECTED   Streptococcus species NOT DETECTED NOT DETECTED   Streptococcus agalactiae NOT DETECTED NOT DETECTED   Streptococcus pneumoniae NOT DETECTED NOT DETECTED   Streptococcus pyogenes NOT DETECTED NOT DETECTED   A.calcoaceticus-baumannii NOT DETECTED NOT DETECTED   Bacteroides fragilis NOT DETECTED NOT DETECTED   Enterobacterales DETECTED (A) NOT DETECTED   Enterobacter cloacae complex NOT DETECTED NOT DETECTED   Escherichia coli DETECTED (A) NOT DETECTED   Klebsiella aerogenes NOT DETECTED NOT DETECTED   Klebsiella oxytoca NOT DETECTED NOT DETECTED   Klebsiella pneumoniae DETECTED (A) NOT DETECTED   Proteus species NOT DETECTED NOT DETECTED   Salmonella species NOT DETECTED NOT DETECTED   Serratia marcescens NOT DETECTED NOT DETECTED   Haemophilus influenzae NOT DETECTED NOT DETECTED   Neisseria meningitidis NOT DETECTED  NOT DETECTED   Pseudomonas aeruginosa NOT DETECTED NOT DETECTED   Stenotrophomonas maltophilia NOT DETECTED NOT DETECTED   Candida albicans NOT DETECTED NOT DETECTED   Candida auris NOT DETECTED NOT DETECTED   Candida glabrata NOT DETECTED NOT DETECTED   Candida krusei NOT DETECTED NOT DETECTED   Candida parapsilosis NOT DETECTED NOT DETECTED   Candida tropicalis NOT DETECTED NOT DETECTED   Cryptococcus neoformans/gattii NOT DETECTED NOT DETECTED   CTX-M ESBL DETECTED (A) NOT DETECTED   Carbapenem resistance IMP NOT DETECTED NOT DETECTED   Carbapenem resistance KPC NOT DETECTED NOT DETECTED   Carbapenem resistance NDM NOT DETECTED NOT DETECTED   Carbapenem resist OXA 48 LIKE NOT DETECTED NOT DETECTED   Carbapenem resistance VIM NOT DETECTED NOT DETECTED    Dolphus Roller, PharmD, BCPS 08/15/2024 2:24 PM

## 2024-08-15 NOTE — Progress Notes (Signed)
 "        [facility]                                                                               Nathan [name], is a 79 y.o. male, DOB - [birth year], FMW:[id number] Admit date - 08/14/2024    Outpatient Primary MD for the patient is Yolande Toribio MATSU, MD  LOS - 1  days    Brief summary    Nathan [name] is a 79 y.o. male with medical history significant of chronic calculous cholecystitis, hypertension, mild dementia, hyperlipidemia, DM2, CKD 4 who comes into the hospital with sudden onset nausea, vomiting, and not feeling well.   CT scan of the abdomen and pelvis which could not locate the gallbladder but it did show some gas and high density material in the expected location of the gallbladder with concerns for a gastric fistula in this area. CT scan also showed numerous noncalcified gallstones in the distal CBD with dilation and minimal pneumobilia.    Assessment & Plan    Assessment and Plan:   Acute on chronic cholecystitis:  General surgery / GI consulted.  MRI of the abdomen without contrast ordered for further evaluation.  Started the patient on IV Zosyn  Patient currently n.p.o. hence continue with Ringer lactate fluids Follow blood cultures. Patient afebrile and WBC count has normalized, lactate has normalized Discussed the plan with the patient    Elevated liver enzymes secondary to choledocholithiasis and the above.    History of coronary artery disease s/p CABG Holding aspirin  and statin for possible procedure.  Hyperkalemia Resolved   Hyponatremia Resolved  Stage IV CKD Creatinine appears to be at baseline  History of chronic systolic heart failure Last echocardiogram showed left ventricular ejection fraction of 35 to 40%. In view of his n.p.o. status and possible procedure, hold Lasix  and continue with beta-blocker. No signs of decompensation.   Hypertension Blood pressure parameters are optimal   Dementia No agitation  seen.   History of paroxysmal atrial fibrillation Rate controlled and currently on beta-blocker In view of his history of GI bleed and spontaneous intramuscular hematoma he was not felt to be a good candidate for anticoagulation at this time.   Hyperlipidemia Holding statins in view of elevated liver enzymes.     Estimated body mass index is 22.77 kg/m as calculated from the following:   Height as of 05/31/24: 5' 3 (1.6 m).   Weight as of this encounter: 58.3 kg.  Code Status: DNR DVT Prophylaxis:  heparin  injection 5,000 Units Start: 08/15/24 0615   Level of Care: Level of care: Progressive Family Communication: None at bedside  Disposition Plan:     Remains inpatient appropriate: Pending further workup  Procedures:  MRI of the abdomen  Consultants:   Gastroenterology General Surgery Antimicrobials:   Anti-infectives (From admission, onward)    Start     Dose/Rate Route Frequency Ordered Stop   08/15/24 0800  piperacillin -tazobactam (ZOSYN ) IVPB 3.375 g        3.375 g 12.5 mL/hr over 240 Minutes Intravenous Every 8 hours 08/15/24 0049     08/14/24 2315  cefTRIAXone  (ROCEPHIN ) 2 g in sodium chloride  0.9 % 100 mL  IVPB        2 g 200 mL/hr over 30 Minutes Intravenous  Once 08/14/24 2303 08/15/24 0045   08/14/24 2315  metroNIDAZOLE  (FLAGYL ) IVPB 500 mg        500 mg 100 mL/hr over 60 Minutes Intravenous  Once 08/14/24 2303 08/15/24 0116        Medications  Scheduled Meds:  heparin   5,000 Units Subcutaneous Q8H   latanoprost   1 drop Both Eyes QHS   metoprolol  succinate  25 mg Oral Daily   pantoprazole   40 mg Oral BID   Continuous Infusions:  lactated ringers      piperacillin -tazobactam (ZOSYN )  IV 3.375 g (08/15/24 0832)   PRN Meds:.acetaminophen  **OR** acetaminophen , ALPRAZolam , ondansetron  **OR** ondansetron  (ZOFRAN ) IV    Subjective:   Nathan [name] was seen and examined today.  Still having abd pain, some nausea.   Objective:   Vitals:    08/15/24 0624 08/15/24 0818 08/15/24 0930 08/15/24 0939  BP:  (!) 87/59 (!) 92/58 (!) 98/58  Pulse:  (!) 110 100 98  Resp:  20 18   Temp: (!) 100.4 F (38 C) 98.5 F (36.9 C)    TempSrc:  Oral    SpO2:  97% 94%   Weight:        Intake/Output Summary (Last 24 hours) at 08/15/2024 1353 Last data filed at 08/15/2024 9060 Gross per 24 hour  Intake 2000 ml  Output --  Net 2000 ml   Filed Weights   08/14/24 2300  Weight: 58.3 kg     Exam General: Alert and oriented x 3, NAD Cardiovascular: S1 S2 auscultated, no murmurs, RRR Respiratory: Clear to auscultation bilaterally, no wheezing, rales or rhonchi Gastrointestinal: Soft, mild gen tenderness.  Ext: no pedal edema bilaterally Neuro: AAOx3,    Data Reviewed:  I have personally reviewed following labs and imaging studies   CBC Lab Results  Component Value Date   WBC 9.5 08/15/2024   RBC 3.32 (L) 08/15/2024   HGB 10.2 (L) 08/15/2024   HCT 31.3 (L) 08/15/2024   MCV 94.3 08/15/2024   MCH 30.7 08/15/2024   PLT 126 (L) 08/15/2024   MCHC 32.6 08/15/2024   RDW 18.0 (H) 08/15/2024   LYMPHSABS 0.7 08/14/2024   MONOABS 1.0 08/14/2024   EOSABS 0.0 08/14/2024   BASOSABS 0.0 08/14/2024     Last metabolic panel Lab Results  Component Value Date   NA 137 08/15/2024   K 4.4 08/15/2024   CL 100 08/15/2024   CO2 22 08/15/2024   BUN 48 (H) 08/15/2024   CREATININE 1.91 (H) 08/15/2024   GLUCOSE 122 (H) 08/15/2024   GFRNONAA 35 (L) 08/15/2024   GFRAA 59 (L) 04/04/2018   CALCIUM  9.4 08/15/2024   PHOS 3.6 07/15/2023   PROT 6.5 08/15/2024   ALBUMIN 3.8 08/15/2024   LABGLOB 2.3 01/25/2024   AGRATIO 2.3 (H) 07/20/2021   BILITOT 1.9 (H) 08/15/2024   ALKPHOS 216 (H) 08/15/2024   AST 692 (H) 08/15/2024   ALT 951 (H) 08/15/2024   ANIONGAP 15 08/15/2024    CBG (last 3)  No results for input(s): GLUCAP in the last 72 hours.    Coagulation Profile: Recent Labs  Lab 08/14/24 2215  INR 1.3*     Radiology Studies: CT  ABDOMEN PELVIS W CONTRAST Result Date: 08/14/2024 CLINICAL DATA:  Acute abdominal pain.  Weakness. EXAM: CT ABDOMEN AND PELVIS WITH CONTRAST TECHNIQUE: Multidetector CT imaging of the abdomen and pelvis was performed using the standard protocol following bolus administration  of intravenous contrast. RADIATION DOSE REDUCTION: This exam was performed according to the departmental dose-optimization program which includes automated exposure control, adjustment of the mA and/or kV according to patient size and/or use of iterative reconstruction technique. CONTRAST:  80mL OMNIPAQUE  IOHEXOL  300 MG/ML  SOLN COMPARISON:  CT 01/16/2024, report from PET CT 06/05/2024 from an outside institution reviewed, images not available FINDINGS: Lower chest: Chronic interstitial lung disease. The heart is enlarged. Coronary artery calcifications. Hepatobiliary: Mild heterogeneous appearance of the liver. No discrete focal lesion. Fluid-filled gallbladder is not seen. Foci of gas and high-density material in the expected location of the gallbladder/gallbladder fossa, series 2, image 30, also described on prior PET. The distal stomach calms in contact with this abnormality. There are numerous noncalcified gallstones in the distal common bile duct, distally stone or contiguous stones spans 19 mm series 8, image 72. The common bile duct is dilated at 13 mm. Minimal pneumobilia. Mild central intrahepatic biliary ductal dilatation. Pancreas: No ductal dilatation or inflammation. Spleen: Calcified granuloma in the spleen. Adrenals/Urinary Tract: No adrenal nodule. No hydronephrosis. Bilateral renal cysts. No further follow-up imaging is recommended. No renal calculi. Nondistended urinary bladder with mild diffuse bladder wall thickening. No perivesicular inflammation. Stomach/Bowel: The stomach is nondistended and not well evaluated. Distal stomach abuts abnormal appearance of the gallbladder/gallbladder fossa. Fecalization of distal small  bowel contents. No obstruction. High-riding cecum in the mid abdomen. The appendix is normal. Moderate volume of colonic stool. Colonic diverticulosis without focal diverticulitis. Small bowel in the left abdomen projects lateral to the descending colon, but no wall thickening or inflammatory change. Similar findings were seen previously. Vascular/Lymphatic: Aortic atherosclerosis. No aortic aneurysm. Patent portal vein. No suspicious lymphadenopathy. Reproductive: Prominent prostate. Other: No free air or ascites. Suspect prior umbilical hernia repair. Musculoskeletal: Chronic bilateral L5 pars interarticularis defects with grade 1 anterolisthesis of L5 on S1. Scattered Schmorl's nodes in the spine. There are no acute or suspicious osseous abnormalities. IMPRESSION: 1. Fluid-filled gallbladder is not seen. Foci of gas and high-density material in the expected location of the gallbladder/gallbladder fossa. Recommend correlation for history of cholecystectomy. Similar findings were described on prior PET. Findings may be postsurgical in etiology,, but are not well-defined by CT. The distal stomach approaches this region of abnormality, and a gastric fistula is also considered. 2. Numerous noncalcified gallstones in the distal common bile duct, distally stone or contiguous stones spans 19 mm. The common bile duct is dilated at 13 mm. Minimal pneumobilia, likely postprocedural. Mild central intrahepatic biliary ductal dilatation. 3. Colonic diverticulosis without diverticulitis. 4. Chronic interstitial lung disease. Aortic Atherosclerosis (ICD10-I70.0). Electronically Signed   By: Andrea Gasman M.D.   On: 08/14/2024 23:50   DG Chest Port 1 View Result Date: 08/14/2024 EXAM: 1 VIEW(S) XRAY OF THE CHEST 08/14/2024 09:18:00 PM COMPARISON: Comparison with 01/16/2024. CLINICAL HISTORY: Questionable sepsis - evaluate for abnormality. FINDINGS: LUNGS AND PLEURA: Low lung volumes accentuate pulmonary vascularity. No focal  pulmonary opacity. No pleural effusion. No pneumothorax. HEART AND MEDIASTINUM: Cardiomegaly. Sternotomy and CABG. BONES AND SOFT TISSUES: Remote left rib fracture. IMPRESSION: 1. No acute cardiopulmonary abnormality. 2. Cardiomegaly. Electronically signed by: Norman Gatlin MD 08/14/2024 09:21 PM EST RP Workstation: [id number]       Elgie Butter M.D. [facility] 08/15/2024, 1:53 PM  Available via Epic secure chat 7am-7pm After 7 pm, please refer to night coverage provider listed on amion.  SABRAa "

## 2024-08-16 ENCOUNTER — Encounter (HOSPITAL_COMMUNITY): Payer: Self-pay | Admitting: Internal Medicine

## 2024-08-16 ENCOUNTER — Inpatient Hospital Stay (HOSPITAL_COMMUNITY): Admitting: Anesthesiology

## 2024-08-16 ENCOUNTER — Inpatient Hospital Stay (HOSPITAL_COMMUNITY)

## 2024-08-16 ENCOUNTER — Encounter: Payer: Self-pay | Attending: Internal Medicine

## 2024-08-16 DIAGNOSIS — K222 Esophageal obstruction: Secondary | ICD-10-CM

## 2024-08-16 DIAGNOSIS — A499 Bacterial infection, unspecified: Secondary | ICD-10-CM | POA: Diagnosis present

## 2024-08-16 LAB — COMPREHENSIVE METABOLIC PANEL WITH GFR
ALT: 408 U/L — ABNORMAL HIGH (ref 0–44)
ALT: 529 U/L — ABNORMAL HIGH (ref 0–44)
AST: 205 U/L — ABNORMAL HIGH (ref 15–41)
AST: 302 U/L — ABNORMAL HIGH (ref 15–41)
Albumin: 3 g/dL — ABNORMAL LOW (ref 3.5–5.0)
Albumin: 3.1 g/dL — ABNORMAL LOW (ref 3.5–5.0)
Alkaline Phosphatase: 139 U/L — ABNORMAL HIGH (ref 38–126)
Alkaline Phosphatase: 139 U/L — ABNORMAL HIGH (ref 38–126)
Anion gap: 12 (ref 5–15)
Anion gap: 12 (ref 5–15)
BUN: 40 mg/dL — ABNORMAL HIGH (ref 8–23)
BUN: 45 mg/dL — ABNORMAL HIGH (ref 8–23)
CO2: 22 mmol/L (ref 22–32)
CO2: 23 mmol/L (ref 22–32)
Calcium: 8.9 mg/dL (ref 8.9–10.3)
Calcium: 9.1 mg/dL (ref 8.9–10.3)
Chloride: 103 mmol/L (ref 98–111)
Chloride: 103 mmol/L (ref 98–111)
Creatinine, Ser: 1.82 mg/dL — ABNORMAL HIGH (ref 0.61–1.24)
Creatinine, Ser: 1.85 mg/dL — ABNORMAL HIGH (ref 0.61–1.24)
GFR, Estimated: 37 mL/min — ABNORMAL LOW
GFR, Estimated: 37 mL/min — ABNORMAL LOW
Glucose, Bld: 141 mg/dL — ABNORMAL HIGH (ref 70–99)
Glucose, Bld: 91 mg/dL (ref 70–99)
Potassium: 3.6 mmol/L (ref 3.5–5.1)
Potassium: 3.7 mmol/L (ref 3.5–5.1)
Sodium: 137 mmol/L (ref 135–145)
Sodium: 138 mmol/L (ref 135–145)
Total Bilirubin: 1 mg/dL (ref 0.0–1.2)
Total Bilirubin: 1.1 mg/dL (ref 0.0–1.2)
Total Protein: 5.5 g/dL — ABNORMAL LOW (ref 6.5–8.1)
Total Protein: 5.9 g/dL — ABNORMAL LOW (ref 6.5–8.1)

## 2024-08-16 LAB — CBC WITH DIFFERENTIAL/PLATELET
Abs Immature Granulocytes: 0.05 10*3/uL (ref 0.00–0.07)
Basophils Absolute: 0 10*3/uL (ref 0.0–0.1)
Basophils Relative: 1 %
Eosinophils Absolute: 0 10*3/uL (ref 0.0–0.5)
Eosinophils Relative: 0 %
HCT: 29.6 % — ABNORMAL LOW (ref 39.0–52.0)
Hemoglobin: 9.5 g/dL — ABNORMAL LOW (ref 13.0–17.0)
Immature Granulocytes: 1 %
Lymphocytes Relative: 4 %
Lymphs Abs: 0.3 10*3/uL — ABNORMAL LOW (ref 0.7–4.0)
MCH: 29.8 pg (ref 26.0–34.0)
MCHC: 32.1 g/dL (ref 30.0–36.0)
MCV: 92.8 fL (ref 80.0–100.0)
Monocytes Absolute: 0.3 10*3/uL (ref 0.1–1.0)
Monocytes Relative: 4 %
Neutro Abs: 7.9 10*3/uL — ABNORMAL HIGH (ref 1.7–7.7)
Neutrophils Relative %: 90 %
Platelets: 119 10*3/uL — ABNORMAL LOW (ref 150–400)
RBC: 3.19 MIL/uL — ABNORMAL LOW (ref 4.22–5.81)
RDW: 17.4 % — ABNORMAL HIGH (ref 11.5–15.5)
WBC: 8.6 10*3/uL (ref 4.0–10.5)
nRBC: 0 % (ref 0.0–0.2)

## 2024-08-16 LAB — CULTURE, BLOOD (ROUTINE X 2)

## 2024-08-16 LAB — CBC
HCT: 26.4 % — ABNORMAL LOW (ref 39.0–52.0)
Hemoglobin: 8.6 g/dL — ABNORMAL LOW (ref 13.0–17.0)
MCH: 30.2 pg (ref 26.0–34.0)
MCHC: 32.6 g/dL (ref 30.0–36.0)
MCV: 92.6 fL (ref 80.0–100.0)
Platelets: 122 10*3/uL — ABNORMAL LOW (ref 150–400)
RBC: 2.85 MIL/uL — ABNORMAL LOW (ref 4.22–5.81)
RDW: 17.5 % — ABNORMAL HIGH (ref 11.5–15.5)
WBC: 6.6 10*3/uL (ref 4.0–10.5)
nRBC: 0 % (ref 0.0–0.2)

## 2024-08-16 LAB — GLUCOSE, CAPILLARY
Glucose-Capillary: 52 mg/dL — ABNORMAL LOW (ref 70–99)
Glucose-Capillary: 96 mg/dL (ref 70–99)
Glucose-Capillary: 133 mg/dL — ABNORMAL HIGH (ref 70–99)
Glucose-Capillary: 142 mg/dL — ABNORMAL HIGH (ref 70–99)

## 2024-08-16 MED ORDER — DEXTROSE 50 % IV SOLN
INTRAVENOUS | Status: AC
  Filled 2024-08-16: qty 50

## 2024-08-16 MED ORDER — DICLOFENAC SUPPOSITORY 100 MG
RECTAL | Status: DC | PRN
  Administered 2024-08-16: 100 mg via RECTAL

## 2024-08-16 MED ORDER — FENTANYL CITRATE (PF) 100 MCG/2ML IJ SOLN
INTRAMUSCULAR | Status: DC | PRN
  Administered 2024-08-16: 25 ug via INTRAVENOUS

## 2024-08-16 MED ORDER — GLUCAGON HCL RDNA (DIAGNOSTIC) 1 MG IJ SOLR
INTRAMUSCULAR | Status: DC | PRN
  Administered 2024-08-16: .5 mg via INTRAVENOUS

## 2024-08-16 MED ORDER — ONDANSETRON HCL 4 MG/2ML IJ SOLN
INTRAMUSCULAR | Status: DC | PRN
  Administered 2024-08-16: 4 mg via INTRAVENOUS

## 2024-08-16 MED ORDER — LIDOCAINE HCL (CARDIAC) PF 100 MG/5ML IV SOSY
PREFILLED_SYRINGE | INTRAVENOUS | Status: DC | PRN
  Administered 2024-08-16: 80 mg via INTRAVENOUS

## 2024-08-16 MED ORDER — PHENYLEPHRINE HCL (PRESSORS) 10 MG/ML IV SOLN
INTRAVENOUS | Status: DC | PRN
  Administered 2024-08-16 (×2): 80 ug via INTRAVENOUS

## 2024-08-16 MED ORDER — LACTATED RINGERS IV SOLN: INTRAVENOUS | Status: AC

## 2024-08-16 MED ORDER — FENTANYL CITRATE (PF) 100 MCG/2ML IJ SOLN
INTRAMUSCULAR | Status: AC
  Filled 2024-08-16: qty 2

## 2024-08-16 MED ORDER — PROPOFOL 10 MG/ML IV BOLUS
INTRAVENOUS | Status: DC | PRN
  Administered 2024-08-16: 50 mg via INTRAVENOUS

## 2024-08-16 MED ORDER — DEXAMETHASONE SODIUM PHOSPHATE 4 MG/ML IJ SOLN
INTRAMUSCULAR | Status: DC | PRN
  Administered 2024-08-16: 5 mg via INTRAVENOUS

## 2024-08-16 MED ORDER — DICLOFENAC SUPPOSITORY 100 MG: 100.0000 mg | Freq: Once | RECTAL | Status: AC

## 2024-08-16 MED ORDER — LACTATED RINGERS IV SOLN: INTRAVENOUS | Status: DC | PRN

## 2024-08-16 MED ORDER — DICLOFENAC SUPPOSITORY 100 MG
RECTAL | Status: AC
  Filled 2024-08-16: qty 1

## 2024-08-16 MED ORDER — DEXTROSE 50 % IV SOLN
25.0000 g | INTRAVENOUS | Status: AC
  Administered 2024-08-16: 12.5 g via INTRAVENOUS

## 2024-08-16 MED ORDER — GLUCAGON HCL RDNA (DIAGNOSTIC) 1 MG IJ SOLR
INTRAMUSCULAR | Status: AC
  Filled 2024-08-16: qty 1

## 2024-08-16 MED ORDER — SUGAMMADEX SODIUM 200 MG/2ML IV SOLN
INTRAVENOUS | Status: DC | PRN
  Administered 2024-08-16: 150 mg via INTRAVENOUS

## 2024-08-16 MED ORDER — ROCURONIUM BROMIDE 100 MG/10ML IV SOLN
INTRAVENOUS | Status: DC | PRN
  Administered 2024-08-16: 50 mg via INTRAVENOUS

## 2024-08-16 MED ORDER — SODIUM CHLORIDE 0.9 % IV SOLN: INTRAVENOUS | Status: DC

## 2024-08-16 MED ORDER — SODIUM CHLORIDE 0.9 % IV SOLN
INTRAVENOUS | Status: DC | PRN
  Administered 2024-08-16: 90 mL

## 2024-08-16 NOTE — Anesthesia Preprocedure Evaluation (Addendum)
"                                    Anesthesia Evaluation  Patient identified by MRN, date of birth, ID band Patient awake    Reviewed: Allergy & Precautions, NPO status , Patient's Chart, lab work & pertinent test results  Airway Mallampati: Unable to assess       Dental  (+) Missing, Dental Advisory Given, Poor Dentition   Pulmonary sleep apnea     + decreased breath sounds      Cardiovascular hypertension, Pt. on medications + CAD, + Past MI, + CABG and +CHF  + Valvular Problems/Murmurs AS  Rhythm:Regular Rate:Normal  - s/p CABG, AVR  Echo:  2. Left ventricular ejection fraction, by estimation, is 20 -30%. The  left ventricle has moderately decreased function. The left ventricle  demonstrates global hypokinesis, more prominent mid to distal anteroseptal  wall. The left ventricular internal  cavity size was mildly dilated.   3. No LV mural thrombus noted with Definity  contrast.   4. Right ventricular systolic function is normal. The right ventricular  size is normal.   5. Left atrial size was severely dilated.   6. The mitral valve is degenerative. Moderate mitral annular  calcification.   7. The aortic valve has been repaired/replaced. Aortic valve  regurgitation is not visualized. There is a 23 mm Edwards bioprosthetic  valve present in the aortic position. Aortic valve mean gradient measures  7.0 mmHg.      Neuro/Psych   Anxiety     negative neurological ROS     GI/Hepatic Neg liver ROS, PUD,GERD  Medicated,,  Endo/Other  diabetes, Type 2, Oral Hypoglycemic Agents    Renal/GU Renal disease     Musculoskeletal  (+) Arthritis ,    Abdominal   Peds  Hematology  (+) Blood dyscrasia, anemia   Anesthesia Other Findings   Reproductive/Obstetrics                              Anesthesia Physical Anesthesia Plan  ASA: 4  Anesthesia Plan: General   Post-op Pain Management: Ofirmev  IV (intra-op)*   Induction:  Intravenous  PONV Risk Score and Plan: 2 and Ondansetron  and Treatment may vary due to age or medical condition  Airway Management Planned: Oral ETT  Additional Equipment:   Intra-op Plan:   Post-operative Plan: Extubation in OR  Informed Consent: I have reviewed the patients History and Physical, chart, labs and discussed the procedure including the risks, benefits and alternatives for the proposed anesthesia with the patient or authorized representative who has indicated his/her understanding and acceptance.   Patient has DNR.  Discussed DNR with power of attorney.   Dental advisory given  Plan Discussed with: CRNA  Anesthesia Plan Comments: (No compressions. )         Anesthesia Quick Evaluation  "

## 2024-08-16 NOTE — Op Note (Signed)
 [facility] Patient Name: Nathan [name] Procedure Date: 08/16/2024 MRN: [id number] Attending MD: Lupita FORBES Commander , MD, [id number] Date of Birth: 1945/09/15 CSN: [id number] Age: 79 Admit Type: Inpatient Procedure:                ERCP Indications:              Bile duct stone(s), For therapy of bile duct                            stone(s), And biliary sepsis patient has a history                            of cholecystitis status post cholecystostomy tube.                            He presented with E. coli (ESBL), Enterobacteralese                            and Klebsiella positive blood cultures. There was a                            question of a choledocho gastric or                            choledochoduodenal fistula on imaging. Providers:                Lupita CHARLENA Commander, MD, Randall Lines, RN, Curtistine Bishop, Technician Referring MD:              Medicines:                General Anesthesia, The patient is on intermittent                            continuous IV meropenem , diclofenac  100 mg per                            rectum was administered. Complications:            No immediate complications. Estimated Blood Loss:     Estimated blood loss was minimal. Procedure:                Pre-Anesthesia Assessment:                           - Prior to the procedure, a History and Physical                            was performed, and patient medications and                            allergies were reviewed. The patient's tolerance of  previous anesthesia was also reviewed. The risks                            and benefits of the procedure and the sedation                            options and risks were discussed with the patient.                            All questions were answered, and informed consent                            was obtained. Prior Anticoagulants: The patient                            last  took heparin  1 day prior to the procedure. ASA                            Grade Assessment: IV - A patient with severe                            systemic disease that is a constant threat to life.                            After reviewing the risks and benefits, the patient                            was deemed in satisfactory condition to undergo the                            procedure.                           After obtaining informed consent, the scope was                            passed under direct vision. Throughout the                            procedure, the patient's blood pressure, pulse, and                            oxygen  saturations were monitored continuously.                            Given the question of choledocho gastric or                            choledochoduodenal fistula, I performed EGD with                            the gastroscope first. The [id number]-[id number] ([id number])  Olympus endoscope was introduced through the mouth                            into the esophagus, stomach and duodenum. There was                            a distal esophageal ring which I dilated with an                            18-20 mm [employer] balloon with appropriate                            mucosal disruption. I decided to dilate that so as                            to avoid trauma from the duodenscope or prevention                            of passage due to the ring. The [employer] D single use duodenoscope was then                            introduced through the mouth, and used to inject                            contrast into and used to inject contrast into the                            bile duct. The scout film was normal. The ERCP was                            somewhat difficult due to challenging cannulation                            because of inadequate scope positioning. The                             patient tolerated the procedure well. Scope In: Scope Out: Findings:      I identified a normal-appearing but small papilla in the typical       location of the second portion of the duodenum. There was a rather large       suprapapillary duodenal diverticulum that was creating angulation change       in the duodenum and made cannulation somewhat difficult. I was able to       use the Hydratome Rx 44 sphincterotome with the 035 Hydra Jagwire to       deeply cannulate the bile duct using wire-guided technique. The pancreas       was not entered. Contrast was injected showing numerous stones and       debris in the distal bile duct. The entire biliary tree was dilated  though mildly so, maximum common bile duct common hepatic duct dilator       was 10 mm. I did not fill out the cystic duct or the gallbladder.       Biliary sphincterotomy was then performed. Approximately 5 to 6 mm. No       bleeding. There was a good flow of bile. I then used a 9-12 mm Boston       Scientific stone extraction balloon. It was difficult to pull the       balloon at 9 mm through the sphincterotomy site. I then used an 8 - 9 -       10 mm dilating balloon in the distal bile duct. There was increased bile       flow and a slight tinge of blood but no significant bleeding. Then using       the extraction balloon I did multiple sweeps and extractions and stone       debris most of a green but some dark black pigmented calcified stone       material came out. It was not in the form of discrete stones but sludge       and significant debris and large amount of it. It was challenging to       pull the balloon through due to the angle in the duodenum and the       approach of the scope. Multiple maneuvers improved this but not so much.       I also used sterile saline to irrigate the bile duct out of debris. At       the end there did not appear to be stone material but given the       complexity  of this case, the angulation and the difficulty I elected to       leave a plastic biliary stent to ensure drainage. A 10 French 7 cm       double flap plastic Boston Scientific stent was left. Pancreas was not       entered. I personally interpreted the images. Impression:               - Choledocholithiasis with a partial obstruction                            was found. Stones were extracted after biliary                            sphincterotomy and biliary sphincteroplasty                            performed. Balloon extraction of multiple stones                            and debris and sludge.                           - One plastic stent was placed into the bile duct.                           - Duodenal diverticulum.                           -  Benign-appearing esophageal stenosis. Dilated. Moderate Sedation:      Not Applicable - Patient had care per Anesthesia. Recommendation:           -  Return to floor for continued hospital care                           Continue meropenem  per biliary sepsis                           Follow-up CBC, CMP in a.m.                           Clear liquids for now will advance diet as                            tolerated starting tomorrow, continue IV fluids                            through tomorrow at least                           He will need a repeat ERCP in the coming few                            months to remove the stent and clear the bile duct.                           - Note, it is unclear to me how much the residual                            gallbladder status post cholecystostomy is                            contributing to his problems. He is not an                            operative candidate. If he has persistent issues                            may need to consider drainage of that area                            depending upon clinical course.                           - I will follow-up on him in the  AM. Procedure Code(s):        --- Professional ---                           [phone], Endoscopic retrograde                            cholangiopancreatography (ERCP); with placement of  endoscopic stent into biliary or pancreatic duct,                            including pre- and post-dilation and guide wire                            passage, when performed, including sphincterotomy,                            when performed, each stent                           43277, 59, Endoscopic retrograde                            cholangiopancreatography (ERCP); with                            trans-endoscopic balloon dilation of                            biliary/pancreatic duct(s) or of ampulla                            (sphincteroplasty), including sphincterotomy, when                            performed, each duct                           43264, Endoscopic retrograde                            cholangiopancreatography (ERCP); with removal of                            calculi/debris from biliary/pancreatic duct(s)                           43249, Esophagogastroduodenoscopy, flexible,                            transoral; with transendoscopic balloon dilation of                            esophagus (less than 30 mm diameter)                           74328, Endoscopic catheterization of the biliary                            ductal system, radiological supervision and                            interpretation Diagnosis Code(s):        --- Professional ---  K80.51, Calculus of bile duct without cholangitis                            or cholecystitis with obstruction                           K22.2, Esophageal obstruction                           K57.10, Diverticulosis of small intestine without                            perforation or abscess without bleeding CPT copyright 2022 American [employer] Association. All rights reserved. The codes  documented in this report are preliminary and upon coder review may  be revised to meet current compliance requirements. Lupita FORBES Commander, MD 08/16/2024 3:21:29 PM This report has been signed electronically. Number of Addenda: 0

## 2024-08-16 NOTE — Interval H&P Note (Signed)
 History and Physical Interval Note:  08/16/2024 12:41 PM  Nathan [name]  has presented today for surgery, with the diagnosis of choledocholithiasis.  The various methods of treatment have been discussed with the patient and family. After consideration of risks, benefits and other options for treatment, the patient has consented to  Procedures: ERCP, WITH SPHINCTEROTOMY (N/A) ERCP, WITH INTERVENTION IF INDICATED (N/A) as a surgical intervention.  The patient's history has been reviewed, patient examined, no change in status, stable for surgery.  I have reviewed the patient's chart and labs.  Questions were answered to the patient's satisfaction.     Lupita Commander

## 2024-08-16 NOTE — Transfer of Care (Signed)
 Immediate Anesthesia Transfer of Care Note  Patient: Nathan [name]  Procedure(s) Performed: ERCP, WITH SPHINCTEROTOMY ERCP, WITH COMMON BILE DUCT CALCULUS REMOVAL USING BALLOON INSERTION, STENT, BILE DUCT EGD, WITH BALLOON DILATION DILATION, STRICTURE, BILE DUCT  Patient Location: PACU  Anesthesia Type:General  Level of Consciousness: awake and alert   Airway & Oxygen  Therapy: Patient Spontanous Breathing and Patient connected to face mask oxygen   Post-op Assessment: Report given to RN and Post -op Vital signs reviewed and stable  Post vital signs: Reviewed and stable  Last Vitals:  Vitals Value Taken Time  BP 99/46 08/16/24 15:06  Temp    Pulse 96 08/16/24 15:07  Resp 20 08/16/24 15:07  SpO2 90 % 08/16/24 15:07  Vitals shown include unfiled device data.  Last Pain:  Vitals:   08/16/24 1230  TempSrc: Temporal  PainSc: 0-No pain         Complications: No notable events documented.

## 2024-08-16 NOTE — Progress Notes (Addendum)
 "        [facility]                                                                               [name], is a 79 y.o. male, DOB - 01/14/1946, FMW:[id number] Admit date - 08/14/2024    Outpatient Primary MD for the patient is Nathan Toribio MATSU, MD  LOS - 2  days    Brief summary    Nathan [name] is a 79 y.o. male with medical history significant of chronic calculous cholecystitis, hypertension, mild dementia, hyperlipidemia, DM2, CKD 4 who comes into the hospital with sudden onset nausea, vomiting, and not feeling well.   CT scan of the abdomen and pelvis which could not locate the gallbladder but it did show some gas and high density material in the expected location of the gallbladder with concerns for a gastric fistula in this area. CT scan also showed numerous noncalcified gallstones in the distal CBD with dilation and minimal pneumobilia.    Assessment & Plan    Assessment and Plan:   Acute on chronic cholecystitis/ Choledocholithiasis General surgery / GI consulted.  MRI of the abdomen without contrast ordered , showed  Choledocholithiasis with multifocal intraluminal filling defects within the downstream common bile duct to the level of the ampulla. Contracted gallbladder containing gallstones. The lateral gallbladder wall is inseparable from the adjacent hepatic flexure, which may reflect adhesions. Given findings of intraluminal gas on recent CT, this finding raises suspicion for fistulization. Started the patient on IV Zosyn  transitioned to IV meropenem .  Continue with IV fluids Patient afebrile and WBC count has normalized, lactate has normalized.    ESBL E coli and Klebsiella bacteremia Patient on IV meropenem .  Elevated liver enzymes secondary to choledocholithiasis and the above. Repeat labs are pending.     History of coronary artery disease s/p CABG Held aspirin  for ERCP.  Restart on discharge.    Hyperkalemia Resolved   Hyponatremia Resolved  Stage IV CKD Creatinine appears to be at baseline  History of chronic systolic heart failure Last echocardiogram showed left ventricular ejection fraction of 35 to 40%.  hold Lasix  and continue with beta-blocker. No signs of decompensation.   Hypertension Blood pressure parameters are borderline .  BP this afternoon is 93/62 mmhg.    Dementia No agitation seen.   History of paroxysmal atrial fibrillation Rate controlled and currently on beta-blocker In view of his history of GI bleed and spontaneous intramuscular hematoma he was not felt to be a good candidate for anticoagulation at this time.   Hyperlipidemia Holding statins in view of elevated liver enzymes.     Estimated body mass index is 22.77 kg/m as calculated from the following:   Height as of 05/31/24: 5' 3 (1.6 m).   Weight as of this encounter: 58.3 kg.  Code Status: DNR DVT Prophylaxis:  heparin  injection 5,000 Units Start: 08/17/24 0600   Level of Care: Level of care: Progressive Family Communication: None at bedside  Disposition Plan:     Remains inpatient appropriate: Pending further workup  Procedures:  MRI of the abdomen  Consultants:   Gastroenterology General Surgery Antimicrobials:   Anti-infectives (From admission, onward)  Start     Dose/Rate Route Frequency Ordered Stop   08/15/24 1430  meropenem  (MERREM ) 1 g in sodium chloride  0.9 % 100 mL IVPB        1 g 200 mL/hr over 30 Minutes Intravenous Every 12 hours 08/15/24 1421     08/15/24 0800  piperacillin -tazobactam (ZOSYN ) IVPB 3.375 g  Status:  Discontinued        3.375 g 12.5 mL/hr over 240 Minutes Intravenous Every 8 hours 08/15/24 0049 08/15/24 1417   08/14/24 2315  cefTRIAXone  (ROCEPHIN ) 2 g in sodium chloride  0.9 % 100 mL IVPB        2 g 200 mL/hr over 30 Minutes Intravenous  Once 08/14/24 2303 08/15/24 0045   08/14/24 2315  metroNIDAZOLE  (FLAGYL ) IVPB 500 mg         500 mg 100 mL/hr over 60 Minutes Intravenous  Once 08/14/24 2303 08/15/24 0116        Medications  Scheduled Meds:  diclofenac   100 mg Rectal Once   [START ON 08/17/2024] heparin   5,000 Units Subcutaneous Q8H   latanoprost   1 drop Both Eyes QHS   metoprolol  succinate  25 mg Oral Daily   pantoprazole   40 mg Oral BID   Continuous Infusions:  meropenem  (MERREM ) IV 1 g (08/16/24 1025)   PRN Meds:.acetaminophen  **OR** acetaminophen , ALPRAZolam , ondansetron  **OR** ondansetron  (ZOFRAN ) IV    Subjective:   Nathan [name] was seen and examined today.  Nauseated, no chest pain or sob.  Objective:   Vitals:   08/16/24 1521 08/16/24 1532 08/16/24 1537 08/16/24 1559  BP: (!) 91/43 (!) 97/58 (!) 91/57 93/62  Pulse: 92 94 93 93  Resp: 16 (!) 21 17 18   Temp:    98.5 F (36.9 C)  TempSrc:    Oral  SpO2: 94% 96% 94% 94%  Weight:        Intake/Output Summary (Last 24 hours) at 08/16/2024 1619 Last data filed at 08/16/2024 1511 Gross per 24 hour  Intake 2527.41 ml  Output 1500 ml  Net 1027.41 ml   Filed Weights   08/14/24 2300  Weight: 58.3 kg     Exam General exam: Appears calm and comfortable  Respiratory system: Clear to auscultation. Respiratory effort normal. Cardiovascular system: S1 & S2 heard, RRR.  Gastrointestinal system: Abdomen is soft bs+, mild abd tenderness.  Central nervous system: Alert and oriented. Extremities: Symmetric 5 x 5 power. Skin: No rashes,  Psychiatry: Mood & affect appropriate.    Data Reviewed:  I have personally reviewed following labs and imaging studies   CBC Lab Results  Component Value Date   WBC 6.6 08/15/2024   RBC 2.85 (L) 08/15/2024   HGB 8.6 (L) 08/15/2024   HCT 26.4 (L) 08/15/2024   MCV 92.6 08/15/2024   MCH 30.2 08/15/2024   PLT 122 (L) 08/15/2024   MCHC 32.6 08/15/2024   RDW 17.5 (H) 08/15/2024   LYMPHSABS 0.7 08/14/2024   MONOABS 1.0 08/14/2024   EOSABS 0.0 08/14/2024   BASOSABS 0.0 08/14/2024     Last metabolic  panel Lab Results  Component Value Date   NA 137 08/15/2024   K 3.7 08/15/2024   CL 103 08/15/2024   CO2 23 08/15/2024   BUN 45 (H) 08/15/2024   CREATININE 1.82 (H) 08/15/2024   GLUCOSE 91 08/15/2024   GFRNONAA 37 (L) 08/15/2024   GFRAA 59 (L) 04/04/2018   CALCIUM  8.9 08/15/2024   PHOS 3.6 07/15/2023   PROT 5.5 (L) 08/15/2024   ALBUMIN 3.0 (L)  08/15/2024   LABGLOB 2.3 01/25/2024   AGRATIO 2.3 (H) 07/20/2021   BILITOT 1.0 08/15/2024   ALKPHOS 139 (H) 08/15/2024   AST 302 (H) 08/15/2024   ALT 529 (H) 08/15/2024   ANIONGAP 12 08/15/2024    CBG (last 3)  Recent Labs    08/16/24 1249 08/16/24 1336 08/16/24 1512  GLUCAP 52* 96 133*      Coagulation Profile: Recent Labs  Lab 08/14/24 2215  INR 1.3*     Radiology Studies: DG C-Arm 1-60 Min-No Report Result Date: 08/16/2024 Fluoroscopy was utilized by the requesting physician.  No radiographic interpretation.   MR ABDOMEN MRCP WO CONTRAST Result Date: 08/15/2024 CLINICAL DATA:  Choledocholithiasis EXAM: MRI ABDOMEN WITHOUT CONTRAST  (INCLUDING MRCP) TECHNIQUE: Multiplanar multisequence MR imaging of the abdomen was performed without intravenous contrast. Heavily T2-weighted images of the biliary and pancreatic ducts were obtained. Post-processing was applied at the acquisition scanner with concurrent physician supervision which includes 3D reconstructions, MIPs, volume rendered images and/or shaded surface rendering. COMPARISON:  CT abdomen and pelvis dated 08/14/2024 FINDINGS: Lower chest: Trace bilateral pleural effusions. Hepatobiliary: No mass or other parenchymal abnormality identified. Mild intrahepatic bile duct dilation. The common bile duct is dilated, measuring 13 mm and contains multifocal intraluminal filling defects within the downstream duct to the level of the ampulla. Contracted gallbladder containing gallstones. The lateral gallbladder wall is inseparable from the adjacent hepatic flexure (2:13). Pancreas: No  mass, inflammatory changes, or other parenchymal abnormality identified. Spleen:  Within normal limits in size and appearance. Adrenals/Urinary Tract: No adrenal nodules. No suspicious renal masses identified. No evidence of hydronephrosis. Bilateral T2 hyperintense cysts, measuring up to 4.9 cm in the interpolar left kidney (2:23). Subcentimeter foci of intrinsic T1 hyperintensity bilaterally, likely hemorrhagic/proteinaceous cysts. Stomach/Bowel: Colonic diverticulosis without acute diverticulitis. Left lower quadrant small bowel diverticula. Vascular/Lymphatic: No pathologically enlarged lymph nodes identified. No abdominal aortic aneurysm demonstrated. Other:  Trace bilateral upper quadrant free fluid. Musculoskeletal: No suspicious bone lesions identified. Partially imaged susceptibility artifact related to median sternotomy. IMPRESSION: 1. Choledocholithiasis with multifocal intraluminal filling defects within the downstream common bile duct to the level of the ampulla. 2. Contracted gallbladder containing gallstones. The lateral gallbladder wall is inseparable from the adjacent hepatic flexure, which may reflect adhesions. Given findings of intraluminal gas on recent CT, this finding raises suspicion for fistulization. 3. Trace bilateral pleural effusions and trace bilateral upper quadrant free fluid. 4. Colonic diverticulosis without acute diverticulitis. Electronically Signed   By: Limin  Xu M.D.   On: 08/15/2024 17:25   MR 3D Recon At Scanner Result Date: 08/15/2024 CLINICAL DATA:  Choledocholithiasis EXAM: MRI ABDOMEN WITHOUT CONTRAST  (INCLUDING MRCP) TECHNIQUE: Multiplanar multisequence MR imaging of the abdomen was performed without intravenous contrast. Heavily T2-weighted images of the biliary and pancreatic ducts were obtained. Post-processing was applied at the acquisition scanner with concurrent physician supervision which includes 3D reconstructions, MIPs, volume rendered images and/or shaded  surface rendering. COMPARISON:  CT abdomen and pelvis dated 08/14/2024 FINDINGS: Lower chest: Trace bilateral pleural effusions. Hepatobiliary: No mass or other parenchymal abnormality identified. Mild intrahepatic bile duct dilation. The common bile duct is dilated, measuring 13 mm and contains multifocal intraluminal filling defects within the downstream duct to the level of the ampulla. Contracted gallbladder containing gallstones. The lateral gallbladder wall is inseparable from the adjacent hepatic flexure (2:13). Pancreas: No mass, inflammatory changes, or other parenchymal abnormality identified. Spleen:  Within normal limits in size and appearance. Adrenals/Urinary Tract: No adrenal nodules. No suspicious renal masses  identified. No evidence of hydronephrosis. Bilateral T2 hyperintense cysts, measuring up to 4.9 cm in the interpolar left kidney (2:23). Subcentimeter foci of intrinsic T1 hyperintensity bilaterally, likely hemorrhagic/proteinaceous cysts. Stomach/Bowel: Colonic diverticulosis without acute diverticulitis. Left lower quadrant small bowel diverticula. Vascular/Lymphatic: No pathologically enlarged lymph nodes identified. No abdominal aortic aneurysm demonstrated. Other:  Trace bilateral upper quadrant free fluid. Musculoskeletal: No suspicious bone lesions identified. Partially imaged susceptibility artifact related to median sternotomy. IMPRESSION: 1. Choledocholithiasis with multifocal intraluminal filling defects within the downstream common bile duct to the level of the ampulla. 2. Contracted gallbladder containing gallstones. The lateral gallbladder wall is inseparable from the adjacent hepatic flexure, which may reflect adhesions. Given findings of intraluminal gas on recent CT, this finding raises suspicion for fistulization. 3. Trace bilateral pleural effusions and trace bilateral upper quadrant free fluid. 4. Colonic diverticulosis without acute diverticulitis. Electronically Signed    By: Limin  Xu M.D.   On: 08/15/2024 17:25   CT ABDOMEN PELVIS W CONTRAST Result Date: 08/14/2024 CLINICAL DATA:  Acute abdominal pain.  Weakness. EXAM: CT ABDOMEN AND PELVIS WITH CONTRAST TECHNIQUE: Multidetector CT imaging of the abdomen and pelvis was performed using the standard protocol following bolus administration of intravenous contrast. RADIATION DOSE REDUCTION: This exam was performed according to the departmental dose-optimization program which includes automated exposure control, adjustment of the mA and/or kV according to patient size and/or use of iterative reconstruction technique. CONTRAST:  80mL OMNIPAQUE  IOHEXOL  300 MG/ML  SOLN COMPARISON:  CT 01/16/2024, report from PET CT 06/05/2024 from an outside institution reviewed, images not available FINDINGS: Lower chest: Chronic interstitial lung disease. The heart is enlarged. Coronary artery calcifications. Hepatobiliary: Mild heterogeneous appearance of the liver. No discrete focal lesion. Fluid-filled gallbladder is not seen. Foci of gas and high-density material in the expected location of the gallbladder/gallbladder fossa, series 2, image 30, also described on prior PET. The distal stomach calms in contact with this abnormality. There are numerous noncalcified gallstones in the distal common bile duct, distally stone or contiguous stones spans 19 mm series 8, image 72. The common bile duct is dilated at 13 mm. Minimal pneumobilia. Mild central intrahepatic biliary ductal dilatation. Pancreas: No ductal dilatation or inflammation. Spleen: Calcified granuloma in the spleen. Adrenals/Urinary Tract: No adrenal nodule. No hydronephrosis. Bilateral renal cysts. No further follow-up imaging is recommended. No renal calculi. Nondistended urinary bladder with mild diffuse bladder wall thickening. No perivesicular inflammation. Stomach/Bowel: The stomach is nondistended and not well evaluated. Distal stomach abuts abnormal appearance of the  gallbladder/gallbladder fossa. Fecalization of distal small bowel contents. No obstruction. High-riding cecum in the mid abdomen. The appendix is normal. Moderate volume of colonic stool. Colonic diverticulosis without focal diverticulitis. Small bowel in the left abdomen projects lateral to the descending colon, but no wall thickening or inflammatory change. Similar findings were seen previously. Vascular/Lymphatic: Aortic atherosclerosis. No aortic aneurysm. Patent portal vein. No suspicious lymphadenopathy. Reproductive: Prominent prostate. Other: No free air or ascites. Suspect prior umbilical hernia repair. Musculoskeletal: Chronic bilateral L5 pars interarticularis defects with grade 1 anterolisthesis of L5 on S1. Scattered Schmorl's nodes in the spine. There are no acute or suspicious osseous abnormalities. IMPRESSION: 1. Fluid-filled gallbladder is not seen. Foci of gas and high-density material in the expected location of the gallbladder/gallbladder fossa. Recommend correlation for history of cholecystectomy. Similar findings were described on prior PET. Findings may be postsurgical in etiology,, but are not well-defined by CT. The distal stomach approaches this region of abnormality, and a gastric fistula is also  considered. 2. Numerous noncalcified gallstones in the distal common bile duct, distally stone or contiguous stones spans 19 mm. The common bile duct is dilated at 13 mm. Minimal pneumobilia, likely postprocedural. Mild central intrahepatic biliary ductal dilatation. 3. Colonic diverticulosis without diverticulitis. 4. Chronic interstitial lung disease. Aortic Atherosclerosis (ICD10-I70.0). Electronically Signed   By: Andrea Gasman M.D.   On: 08/14/2024 23:50   DG Chest Port 1 View Result Date: 08/14/2024 EXAM: 1 VIEW(S) XRAY OF THE CHEST 08/14/2024 09:18:00 PM COMPARISON: Comparison with 01/16/2024. CLINICAL HISTORY: Questionable sepsis - evaluate for abnormality. FINDINGS: LUNGS AND PLEURA:  Low lung volumes accentuate pulmonary vascularity. No focal pulmonary opacity. No pleural effusion. No pneumothorax. HEART AND MEDIASTINUM: Cardiomegaly. Sternotomy and CABG. BONES AND SOFT TISSUES: Remote left rib fracture. IMPRESSION: 1. No acute cardiopulmonary abnormality. 2. Cardiomegaly. Electronically signed by: Norman Gatlin MD 08/14/2024 09:21 PM EST RP Workstation: [id number]       Elgie Butter M.D. [facility] 08/16/2024, 4:19 PM  Available via Epic secure chat 7am-7pm After 7 pm, please refer to night coverage provider listed on amion.  SABRAa "

## 2024-08-16 NOTE — Progress Notes (Signed)
 Patient has been drowsy since return from ERCP. Patient found sweaty - sugar checked and 142. VS WNL. Patient turned and changed into dry linen. Patient more awake with activity. Took few sips of water  but declined anything else at this time, states I'm tired and quickly drifted back to sleep. Patient followed commands of checking grips - strength even. Pupils evne round and briskly reactive. MD made aware

## 2024-08-16 NOTE — Anesthesia Procedure Notes (Signed)
 Procedure Name: Intubation Date/Time: 08/16/2024 1:26 PM  Performed by: Dartha Meckel, CRNAPre-anesthesia Checklist: Patient identified, Emergency Drugs available, Suction available and Patient being monitored Patient Re-evaluated:Patient Re-evaluated prior to induction Oxygen  Delivery Method: Circle system utilized Preoxygenation: Pre-oxygenation with 100% oxygen  Induction Type: IV induction Ventilation: Mask ventilation without difficulty Laryngoscope Size: Mac and 4 Grade View: Grade I Tube type: Oral Tube size: 7.5 mm Number of attempts: 1 Airway Equipment and Method: Stylet and Oral airway Placement Confirmation: ETT inserted through vocal cords under direct vision, positive ETCO2 and breath sounds checked- equal and bilateral Secured at: 21 cm Tube secured with: Tape Dental Injury: Teeth and Oropharynx as per pre-operative assessment

## 2024-08-16 NOTE — Progress Notes (Signed)
 Patient with cbg 40 dr. Tilford made aware and order for 1/2 amp d50. Given per order from dr. Tilford will reassess

## 2024-08-30 ENCOUNTER — Inpatient Hospital Stay: Admitting: Oncology

## 2024-08-30 ENCOUNTER — Inpatient Hospital Stay: Attending: Oncology

## 2024-09-23 ENCOUNTER — Ambulatory Visit: Admitting: Podiatry
